# Patient Record
Sex: Male | Born: 1935 | Race: White | Hispanic: No | Marital: Married | Smoking: Former smoker
Health system: Southern US, Community
[De-identification: ages and names within clinical notes are randomized; demographics above are authoritative.]

## PROBLEM LIST (undated history)

## (undated) DIAGNOSIS — I214 Non-ST elevation (NSTEMI) myocardial infarction: Secondary | ICD-10-CM

## (undated) DIAGNOSIS — I2510 Atherosclerotic heart disease of native coronary artery without angina pectoris: Secondary | ICD-10-CM

## (undated) DIAGNOSIS — I509 Heart failure, unspecified: Secondary | ICD-10-CM

## (undated) DIAGNOSIS — R892 Abnormal level of other drugs, medicaments and biological substances in specimens from other organs, systems and tissues: Secondary | ICD-10-CM

## (undated) DIAGNOSIS — N400 Enlarged prostate without lower urinary tract symptoms: Secondary | ICD-10-CM

## (undated) DIAGNOSIS — I10 Essential (primary) hypertension: Secondary | ICD-10-CM

## (undated) DIAGNOSIS — M1990 Unspecified osteoarthritis, unspecified site: Secondary | ICD-10-CM

## (undated) DIAGNOSIS — E785 Hyperlipidemia, unspecified: Secondary | ICD-10-CM

## (undated) DIAGNOSIS — M5416 Radiculopathy, lumbar region: Secondary | ICD-10-CM

## (undated) HISTORY — PX: OTHER SURGICAL HISTORY: SHX169

## (undated) HISTORY — DX: Unspecified osteoarthritis, unspecified site: M19.90

## (undated) HISTORY — DX: Hyperlipidemia, unspecified: E78.5

## (undated) HISTORY — DX: Benign prostatic hyperplasia without lower urinary tract symptoms: N40.0

## (undated) HISTORY — DX: Atherosclerotic heart disease of native coronary artery without angina pectoris: I25.10

## (undated) HISTORY — DX: Radiculopathy, lumbar region: M54.16

## (undated) HISTORY — DX: Abnormal level of other drugs, medicaments and biological substances in specimens from other organs, systems and tissues: R89.2

## (undated) HISTORY — DX: Essential (primary) hypertension: I10

## (undated) HISTORY — PX: FINGER SURGERY: SHX640

## (undated) HISTORY — DX: Heart failure, unspecified: I50.9

---

## 1988-01-22 DIAGNOSIS — M1990 Unspecified osteoarthritis, unspecified site: Secondary | ICD-10-CM | POA: Insufficient documentation

## 1988-01-22 HISTORY — PX: KNEE ARTHROSCOPY: SUR90

## 1988-01-22 HISTORY — DX: Unspecified osteoarthritis, unspecified site: M19.90

## 1998-01-21 DIAGNOSIS — N400 Enlarged prostate without lower urinary tract symptoms: Secondary | ICD-10-CM | POA: Insufficient documentation

## 1998-01-21 HISTORY — DX: Benign prostatic hyperplasia without lower urinary tract symptoms: N40.0

## 1999-08-22 ENCOUNTER — Encounter: Payer: Self-pay | Admitting: Family Medicine

## 1999-08-22 LAB — CONVERTED CEMR LAB: PSA: 6.5 ng/mL

## 2000-01-22 DIAGNOSIS — G250 Essential tremor: Secondary | ICD-10-CM | POA: Insufficient documentation

## 2000-01-22 DIAGNOSIS — R972 Elevated prostate specific antigen [PSA]: Secondary | ICD-10-CM | POA: Insufficient documentation

## 2001-09-21 ENCOUNTER — Encounter: Payer: Self-pay | Admitting: Family Medicine

## 2001-09-21 LAB — CONVERTED CEMR LAB: PSA: 5.1 ng/mL

## 2002-01-21 DIAGNOSIS — E785 Hyperlipidemia, unspecified: Secondary | ICD-10-CM | POA: Insufficient documentation

## 2002-01-21 HISTORY — DX: Hyperlipidemia, unspecified: E78.5

## 2002-09-22 ENCOUNTER — Encounter: Payer: Self-pay | Admitting: Family Medicine

## 2003-05-22 DIAGNOSIS — I10 Essential (primary) hypertension: Secondary | ICD-10-CM | POA: Insufficient documentation

## 2003-05-22 HISTORY — DX: Essential (primary) hypertension: I10

## 2004-01-03 ENCOUNTER — Ambulatory Visit: Payer: Self-pay | Admitting: Family Medicine

## 2004-01-17 ENCOUNTER — Ambulatory Visit: Payer: Self-pay | Admitting: Family Medicine

## 2004-08-23 ENCOUNTER — Ambulatory Visit: Payer: Self-pay | Admitting: Family Medicine

## 2005-01-17 ENCOUNTER — Ambulatory Visit: Payer: Self-pay | Admitting: Family Medicine

## 2005-03-21 ENCOUNTER — Encounter: Payer: Self-pay | Admitting: Family Medicine

## 2005-03-21 DIAGNOSIS — R7989 Other specified abnormal findings of blood chemistry: Secondary | ICD-10-CM | POA: Insufficient documentation

## 2005-03-21 LAB — CONVERTED CEMR LAB: PSA: 4.61 ng/mL

## 2005-04-11 ENCOUNTER — Ambulatory Visit: Payer: Self-pay | Admitting: Family Medicine

## 2005-04-15 ENCOUNTER — Ambulatory Visit: Payer: Self-pay | Admitting: Family Medicine

## 2005-05-02 ENCOUNTER — Ambulatory Visit: Payer: Self-pay | Admitting: Family Medicine

## 2005-09-03 ENCOUNTER — Ambulatory Visit: Payer: Self-pay | Admitting: Family Medicine

## 2005-10-17 ENCOUNTER — Ambulatory Visit: Payer: Self-pay | Admitting: Family Medicine

## 2006-01-22 ENCOUNTER — Ambulatory Visit: Payer: Self-pay | Admitting: Family Medicine

## 2006-04-22 HISTORY — PX: KNEE SURGERY: SHX244

## 2006-04-30 ENCOUNTER — Ambulatory Visit: Payer: Self-pay | Admitting: Family Medicine

## 2006-04-30 LAB — CONVERTED CEMR LAB
ALT: 19 units/L (ref 0–40)
Albumin: 3.7 g/dL (ref 3.5–5.2)
Alkaline Phosphatase: 52 units/L (ref 39–117)
Cholesterol: 197 mg/dL (ref 0–200)
Eosinophils Absolute: 0.6 10*3/uL (ref 0.0–0.6)
Eosinophils Relative: 8.4 % — ABNORMAL HIGH (ref 0.0–5.0)
GFR calc Af Amer: 76 mL/min
Glucose, Bld: 98 mg/dL (ref 70–99)
HCT: 42.9 % (ref 39.0–52.0)
Lymphocytes Relative: 32.4 % (ref 12.0–46.0)
MCV: 87.2 fL (ref 78.0–100.0)
Neutro Abs: 3.8 10*3/uL (ref 1.4–7.7)
Neutrophils Relative %: 51.4 % (ref 43.0–77.0)
Potassium: 4.5 meq/L (ref 3.5–5.1)
Sodium: 144 meq/L (ref 135–145)
TSH: 2.02 microintl units/mL (ref 0.35–5.50)
Total Bilirubin: 0.9 mg/dL (ref 0.3–1.2)
Total Protein: 6 g/dL (ref 6.0–8.3)
Triglycerides: 98 mg/dL (ref 0–149)
WBC: 7.2 10*3/uL (ref 4.5–10.5)

## 2006-05-02 ENCOUNTER — Ambulatory Visit: Payer: Self-pay | Admitting: Family Medicine

## 2006-05-05 ENCOUNTER — Encounter: Payer: Self-pay | Admitting: Family Medicine

## 2006-06-03 ENCOUNTER — Encounter: Admission: RE | Admit: 2006-06-03 | Discharge: 2006-06-03 | Payer: Self-pay

## 2006-07-18 ENCOUNTER — Encounter: Payer: Self-pay | Admitting: Family Medicine

## 2006-09-09 ENCOUNTER — Encounter: Payer: Self-pay | Admitting: Family Medicine

## 2006-12-24 ENCOUNTER — Telehealth: Payer: Self-pay | Admitting: Family Medicine

## 2007-01-05 ENCOUNTER — Encounter: Payer: Self-pay | Admitting: Family Medicine

## 2007-04-28 ENCOUNTER — Telehealth: Payer: Self-pay | Admitting: Family Medicine

## 2007-05-26 ENCOUNTER — Telehealth: Payer: Self-pay | Admitting: Family Medicine

## 2007-07-28 ENCOUNTER — Telehealth: Payer: Self-pay | Admitting: Family Medicine

## 2007-08-10 ENCOUNTER — Ambulatory Visit: Payer: Self-pay | Admitting: Family Medicine

## 2007-08-20 ENCOUNTER — Encounter: Payer: Self-pay | Admitting: Family Medicine

## 2007-08-21 ENCOUNTER — Encounter: Payer: Self-pay | Admitting: Family Medicine

## 2007-09-02 ENCOUNTER — Telehealth (INDEPENDENT_AMBULATORY_CARE_PROVIDER_SITE_OTHER): Payer: Self-pay | Admitting: Internal Medicine

## 2007-09-21 ENCOUNTER — Ambulatory Visit: Payer: Self-pay | Admitting: Family Medicine

## 2007-10-01 ENCOUNTER — Telehealth: Payer: Self-pay | Admitting: Family Medicine

## 2007-10-28 ENCOUNTER — Ambulatory Visit: Payer: Self-pay | Admitting: Family Medicine

## 2007-10-28 LAB — CONVERTED CEMR LAB
ALT: 19 units/L (ref 0–53)
Albumin: 3.9 g/dL (ref 3.5–5.2)
Basophils Relative: 0.2 % (ref 0.0–3.0)
CO2: 32 meq/L (ref 19–32)
Calcium: 9.4 mg/dL (ref 8.4–10.5)
Cholesterol: 214 mg/dL (ref 0–200)
Creatinine, Ser: 1.3 mg/dL (ref 0.4–1.5)
Creatinine,U: 130.5 mg/dL
GFR calc Af Amer: 69 mL/min
Glucose, Bld: 97 mg/dL (ref 70–99)
HCT: 40.9 % (ref 39.0–52.0)
Hemoglobin: 14.2 g/dL (ref 13.0–17.0)
Lymphocytes Relative: 34 % (ref 12.0–46.0)
MCHC: 34.7 g/dL (ref 30.0–36.0)
Microalb Creat Ratio: 9.2 mg/g (ref 0.0–30.0)
Microalb, Ur: 1.2 mg/dL (ref 0.0–1.9)
Monocytes Absolute: 0.5 10*3/uL (ref 0.1–1.0)
Monocytes Relative: 8 % (ref 3.0–12.0)
Neutro Abs: 3.3 10*3/uL (ref 1.4–7.7)
RBC: 4.6 M/uL (ref 4.22–5.81)
RDW: 12.6 % (ref 11.5–14.6)
Sodium: 143 meq/L (ref 135–145)
Total CHOL/HDL Ratio: 4.7
Total Protein: 6.2 g/dL (ref 6.0–8.3)
VLDL: 14 mg/dL (ref 0–40)

## 2007-11-02 ENCOUNTER — Ambulatory Visit: Payer: Self-pay | Admitting: Family Medicine

## 2007-11-02 ENCOUNTER — Encounter: Payer: Self-pay | Admitting: Family Medicine

## 2007-11-02 DIAGNOSIS — N529 Male erectile dysfunction, unspecified: Secondary | ICD-10-CM | POA: Insufficient documentation

## 2007-11-27 ENCOUNTER — Ambulatory Visit: Payer: Self-pay | Admitting: Family Medicine

## 2007-11-27 LAB — CONVERTED CEMR LAB: OCCULT 3: NEGATIVE

## 2007-11-30 ENCOUNTER — Encounter (INDEPENDENT_AMBULATORY_CARE_PROVIDER_SITE_OTHER): Payer: Self-pay | Admitting: *Deleted

## 2008-01-18 ENCOUNTER — Ambulatory Visit: Payer: Self-pay | Admitting: Family Medicine

## 2008-05-02 ENCOUNTER — Ambulatory Visit: Payer: Self-pay | Admitting: Family Medicine

## 2008-05-02 DIAGNOSIS — D485 Neoplasm of uncertain behavior of skin: Secondary | ICD-10-CM | POA: Insufficient documentation

## 2008-05-09 ENCOUNTER — Ambulatory Visit: Payer: Self-pay | Admitting: Family Medicine

## 2008-05-09 ENCOUNTER — Encounter: Payer: Self-pay | Admitting: Family Medicine

## 2008-05-18 ENCOUNTER — Telehealth: Payer: Self-pay | Admitting: Family Medicine

## 2008-10-25 ENCOUNTER — Ambulatory Visit: Payer: Self-pay | Admitting: Family Medicine

## 2008-10-25 LAB — CONVERTED CEMR LAB
ALT: 19 units/L (ref 0–53)
Alkaline Phosphatase: 51 units/L (ref 39–117)
BUN: 29 mg/dL — ABNORMAL HIGH (ref 6–23)
Bilirubin, Direct: 0.1 mg/dL (ref 0.0–0.3)
Calcium: 9.4 mg/dL (ref 8.4–10.5)
Chloride: 109 meq/L (ref 96–112)
Cholesterol: 201 mg/dL — ABNORMAL HIGH (ref 0–200)
Creatinine, Ser: 1.6 mg/dL — ABNORMAL HIGH (ref 0.4–1.5)
Creatinine,U: 150.5 mg/dL
Direct LDL: 139.6 mg/dL
Eosinophils Absolute: 0.4 10*3/uL (ref 0.0–0.7)
Eosinophils Relative: 5.6 % — ABNORMAL HIGH (ref 0.0–5.0)
GFR calc non Af Amer: 44.6 mL/min (ref >60)
HDL: 42.9 mg/dL (ref >39.00)
Lymphocytes Relative: 30.7 % (ref 12.0–46.0)
MCV: 88.4 fL (ref 78.0–100.0)
Microalb, Ur: 1.2 mg/dL (ref 0.0–1.9)
Monocytes Absolute: 0.5 10*3/uL (ref 0.1–1.0)
Neutrophils Relative %: 55.4 % (ref 43.0–77.0)
PSA: 5.28 ng/mL — ABNORMAL HIGH (ref 0.10–4.00)
Platelets: 171 10*3/uL (ref 150.0–400.0)
Total Bilirubin: 0.8 mg/dL (ref 0.3–1.2)
Total CHOL/HDL Ratio: 5
Triglycerides: 104 mg/dL (ref 0.0–149.0)
VLDL: 20.8 mg/dL (ref 0.0–40.0)
WBC: 6.3 10*3/uL (ref 4.5–10.5)

## 2008-11-01 LAB — HM DIABETES EYE EXAM: HM Diabetic Eye Exam: NORMAL

## 2008-11-15 ENCOUNTER — Ambulatory Visit: Payer: Self-pay | Admitting: Family Medicine

## 2008-11-15 DIAGNOSIS — E559 Vitamin D deficiency, unspecified: Secondary | ICD-10-CM | POA: Insufficient documentation

## 2008-11-17 ENCOUNTER — Telehealth: Payer: Self-pay | Admitting: Family Medicine

## 2008-11-30 ENCOUNTER — Ambulatory Visit: Payer: Self-pay | Admitting: Family Medicine

## 2008-11-30 ENCOUNTER — Encounter (INDEPENDENT_AMBULATORY_CARE_PROVIDER_SITE_OTHER): Payer: Self-pay | Admitting: *Deleted

## 2008-11-30 LAB — FECAL OCCULT BLOOD, GUAIAC: Fecal Occult Blood: NEGATIVE

## 2008-12-13 ENCOUNTER — Ambulatory Visit: Payer: Self-pay | Admitting: Family Medicine

## 2008-12-13 LAB — CONVERTED CEMR LAB
Albumin: 3.8 g/dL (ref 3.5–5.2)
BUN: 21 mg/dL (ref 6–23)
CO2: 31 meq/L (ref 19–32)
Chloride: 105 meq/L (ref 96–112)

## 2008-12-21 ENCOUNTER — Telehealth: Payer: Self-pay | Admitting: Family Medicine

## 2008-12-21 ENCOUNTER — Ambulatory Visit: Payer: Self-pay | Admitting: Family Medicine

## 2008-12-28 ENCOUNTER — Ambulatory Visit: Payer: Self-pay | Admitting: Family Medicine

## 2009-08-23 ENCOUNTER — Encounter (INDEPENDENT_AMBULATORY_CARE_PROVIDER_SITE_OTHER): Payer: Self-pay | Admitting: *Deleted

## 2009-09-15 ENCOUNTER — Telehealth: Payer: Self-pay | Admitting: Family Medicine

## 2009-12-13 ENCOUNTER — Ambulatory Visit: Payer: Self-pay | Admitting: Family Medicine

## 2009-12-18 LAB — CONVERTED CEMR LAB
BUN: 24 mg/dL — ABNORMAL HIGH (ref 6–23)
Bilirubin, Direct: 0.2 mg/dL (ref 0.0–0.3)
CO2: 25 meq/L (ref 19–32)
Chloride: 107 meq/L (ref 96–112)
Creatinine,U: 119.3 mg/dL
Eosinophils Relative: 4.2 % (ref 0.0–5.0)
Glucose, Bld: 97 mg/dL (ref 70–99)
HCT: 38.9 % — ABNORMAL LOW (ref 39.0–52.0)
Hemoglobin: 13.5 g/dL (ref 13.0–17.0)
Indirect Bilirubin: 0.6 mg/dL (ref 0.0–0.9)
LDL Cholesterol: 127 mg/dL — ABNORMAL HIGH (ref 0–99)
Lymphs Abs: 2 10*3/uL (ref 0.7–4.0)
Microalb, Ur: 2.1 mg/dL — ABNORMAL HIGH (ref 0.0–1.9)
Monocytes Relative: 8.4 % (ref 3.0–12.0)
PSA: 5.28 ng/mL — ABNORMAL HIGH (ref <=4.00)
Platelets: 175 10*3/uL (ref 150.0–400.0)
Potassium: 4.8 meq/L (ref 3.5–5.3)
Total Protein: 6.1 g/dL (ref 6.0–8.3)
VLDL: 15 mg/dL (ref 0–40)
WBC: 5.7 10*3/uL (ref 4.5–10.5)

## 2009-12-20 ENCOUNTER — Ambulatory Visit: Payer: Self-pay | Admitting: Family Medicine

## 2010-02-20 NOTE — Assessment & Plan Note (Signed)
Summary: CPX/JRR   Vital Signs:  Patient profile:   77 year old male Height:      69 inches Weight:      205.75 pounds BMI:     30.49 Temp:     97.0 degrees F oral Pulse rate:   68 / minute Pulse rhythm:   regular BP sitting:   156 / 80  (left arm) Cuff size:   large  Vitals Entered By: Linde Gillis CMA Duncan Dull) (December 20, 2009 2:48 PM) CC: complete physicial   History of Present Illness: Pt here for Comp Exam. He has a [facility] Medicare product. He is having a roaring in his ears and a slight headache. He takes his BP at home  with average in the 120s/60s and pulse of 60-62. No extra meds. His headache is bitemporal with slight ear roaring. He has some drainage.    Preventive Screening-Counseling & Management  Alcohol-Tobacco     Alcohol drinks/day: <1     Alcohol type: beer occas     Smoking Status: quit     Year Quit: 1999     Pack years: Pipe     Passive Smoke Exposure: no  Caffeine-Diet-Exercise     Caffeine use/day: 3     Does Patient Exercise: yes     Type of exercise: Gym bike, treadmill, weights     Times/week: 2  Problems Prior to Update: 1)  Neoplasm of Uncertain Behavior of Skin  (ICD-238.2) 2)  Vitamin D Deficiency  (ICD-268.9) 3)  Renal Insufficiency  (ICD-588.9) 4)  Neoplasm, Skin, Uncertain Behavior  (ICD-238.2) 5)  Special Screening Malig Neoplasms Other Sites  (ICD-V76.49) 6)  Organic Impotence  (ICD-607.84) 7)  Hyperglycemia  (ICD-790.6) 8)  Osteoarthritis  (ICD-715.90) 9)  Hyperlipidemia  (ICD-272.4) 10)  Tremor  (ICD-781.0) 11)  Hypertension  (ICD-401.9) 12)  Benign Prostatic Hypertrophy  (ICD-600.00) 13)  Elevated Prostate Specific Antigen  (ICD-790.93)  Medications Prior to Update: 1)  Finasteride 5 Mg  Tabs (Finasteride) .Marland Kitchen.. 1 By Mouth Daily 2)  Alprazolam 0.25 Mg  Tabs (Alprazolam) .Marland Kitchen.. 1 Tablet By Mouth Three Times A Day  As Needed 3)  Doxazosin Mesylate 4 Mg  Tabs (Doxazosin Mesylate) .Marland Kitchen.. 1 Tablet Daily By Mouth 4)  Metoprolol  Succinate 100 Mg  Xr24h-Tab (Metoprolol Succinate) .... One Tab By Mouth At Night 5)  Amlodipine Besylate 10 Mg Tabs (Amlodipine Besylate) .... One Tab By Mouth Daily 6)  Fish Oil 1000 Mg Caps (Omega-3 Fatty Acids) .Marland Kitchen.. 1 Daily By Mouth 7)  Centrum Silver  Tabs (Multiple Vitamins-Minerals) .Marland Kitchen.. 1 Daily By Mouth 8)  Bayer Aspirin Ec Low Dose 81 Mg Tbec (Aspirin) .Marland Kitchen.. 1 Daily By Moputh 9)  Levitra 20 Mg Tabs (Vardenafil Hcl) .... One Tab By Mouth One Hour Prior To Intercourse. 10)  Lisinopril 5 Mg Tabs (Lisinopril) .... One Tab By Mouth in Am  Current Medications (verified): 1)  Finasteride 5 Mg  Tabs (Finasteride) .Marland Kitchen.. 1 By Mouth Daily 2)  Alprazolam 0.25 Mg  Tabs (Alprazolam) .Marland Kitchen.. 1 Tablet By Mouth Three Times A Day  As Needed 3)  Doxazosin Mesylate 4 Mg  Tabs (Doxazosin Mesylate) .Marland Kitchen.. 1 Tablet Daily By Mouth 4)  Metoprolol Succinate 100 Mg  Xr24h-Tab (Metoprolol Succinate) .... One Tab By Mouth At Night 5)  Amlodipine Besylate 10 Mg Tabs (Amlodipine Besylate) .... One Tab By Mouth Daily 6)  Fish Oil 1000 Mg Caps (Omega-3 Fatty Acids) .Marland Kitchen.. 1 Daily By Mouth 7)  Centrum Silver  Tabs (Multiple Vitamins-Minerals) .Marland KitchenMarland KitchenMarland Kitchen  1 Daily By Mouth 8)  Bayer Aspirin Ec Low Dose 81 Mg Tbec (Aspirin) .Marland Kitchen.. 1 Daily By Moputh 9)  Levitra 20 Mg Tabs (Vardenafil Hcl) .... One Tab By Mouth One Hour Prior To Intercourse. 10)  Lisinopril 5 Mg Tabs (Lisinopril) .... One Tab By Mouth in Am  Allergies: 1)  ! 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  Knee  Replacement  (Dr Tresa Moore, [place])  4/ 2008  Family History: Last updated: 12/20/2009 Father dec 66 Aneurysm Mother dec 90 Htn [employer]  dec 65 MI                           A   79  DM  Social History: Last updated: 11/02/2007 Retired Patent examiner,  [employer], Own business(lawn serv) Married/Widower 11/2004 [place]  Daughter dec Metastic Melanoma REMARRIED 6/40 09 Former Smoker Alcohol use-yes Drug use-no  Risk Factors: Alcohol Use: <1 (12/20/2009) Caffeine Use: 3 (12/20/2009) Exercise: yes (12/20/2009)  Risk Factors: Smoking Status: quit (12/20/2009) Passive Smoke Exposure: no (12/20/2009)  Family History: Father dec 66 Aneurysm Mother dec 90 Htn [employer]  dec 65 MI                          A   42  DM  Social History: Caffeine use/day:  3  Review of Systems General:  Denies chills, fatigue, fever, sweats, weakness, and weight loss. Eyes:  Denies blurring, discharge, and eye pain. ENT:  Complains of decreased hearing and nosebleeds; denies ear discharge, earache, and ringing in ears; early stages.. CV:  Denies chest pain or discomfort, fainting, fatigue, palpitations, shortness of breath with exertion, swelling of feet, and swelling of hands. Resp:  Denies cough, excessive snoring, shortness of breath, and wheezing. GI:  Complains of constipation; denies abdominal pain, bloody stools, change in bowel habits, dark tarry stools, diarrhea, indigestion, loss of appetite, nausea, vomiting, vomiting blood, and yellowish skin color; rare. GU:  Complains of nocturia; denies discharge, dysuria, and urinary frequency; once. MS:  Denies joint pain, low back pain, muscle aches, cramps, and stiffness; takes gin and raisins for shoulders. Derm:  Denies dryness, itching, and rash. Neuro:  Denies numbness, poor balance, tingling, and tremors.  Physical Exam  General:  Well-developed,well-nourished,in no acute distress; alert,appropriate and cooperative throughout examination. Head:  Normocephalic and atraumatic without obvious abnormalities. Sinuses NT. Eyes:  Conjunctiva clear bilaterally.  Ears:  External ear exam shows no  significant lesions or deformities.  Otoscopic examination reveals clear canals, tympanic membranes are intact bilaterally without bulging, retraction, inflammation or discharge. Hearing is grossly normal bilaterally. Nose:  External nasal examination shows no deformity or inflammation. Nasal mucosa are pink and moist without lesions or exudates. Mouth:  Oral mucosa and oropharynx without lesions or exudates.  Teeth in good repair. Neck:  No deformities, masses, or tenderness noted. Chest Wall:  No deformities, masses, tenderness or gynecomastia noted. Breasts:  No masses or gynecomastia noted Lungs:  Normal respiratory effort, chest expands symmetrically. Lungs are clear to auscultation, no crackles or wheezes. Heart:  Normal rate and regular rhythm. S1 and S2 normal without gallop, murmur, click, rub or other extra sounds. Abdomen:  Bowel sounds positive,abdomen soft and non-tender without  masses, organomegaly or hernias noted. Rectal:  No external abnormalities noted. Normal sphincter tone. No rectal masses or tenderness. G neg. Genitalia:  Testes bilaterally descended without nodularity, tenderness or masses. No scrotal masses or lesions. No penis lesions or urethral discharge. Prostate:  Prostate gland firm and smooth, no enlargement, nodularity, tenderness, mass, asymmetry or induration. 40-50gms. Msk:  No deformity or scoliosis noted of thoracic or lumbar spine.   Pulses:  R and L carotid,radial,femoral,dorsalis pedis and posterior tibial pulses are full and equal bilaterally Extremities:  No clubbing, cyanosis, edema, or deformity noted with normal full range of motion of all joints.   Neurologic:  No cranial nerve deficits noted. Station and gait are normal. Sensory, motor and coordinative functions appear intact. Skin:  Intact without suspicious lesions or rashes Cervical Nodes:  No lymphadenopathy noted Inguinal Nodes:  No significant adenopathy Psych:  Cognition and judgment appear  intact. Alert and cooperative with normal attention span and concentration. No apparent delusions, illusions, hallucinations   Impression & Recommendations:  Problem # 1:  HEALTH MAINTENANCE EXAM (ICD-V70.0) Assessment Comment Only  Pt has [employer] Medicare. I have personally reviewed the Medicare Annual Wellness questionnaire and have noted 1.   The patient's medical and social history 2.   Their use of alcohol, tobacco or illicit drugs 3.   Their current medications and supplements 4.   The patient's functional ability including ADL's, fall risks, home safety risks. 5.   Diet and physical activities 6.   Evidence for depression or mood disorders  Reviewed preventive care protocols, scheduled due services, and updated immunizations.  Problem # 2:  VITAMIN D DEFICIENCY (ICD-268.9) Assessment: Unchanged Start taking OTC Vit D 1000Iu daily. Should easily get him therapeutic.  Problem # 3:  RENAL INSUFFICIENCY (ICD-588.9) Assessment: Improved Normalized.  Problem # 4:  ORGANIC IMPOTENCE (ICD-607.84) Assessment: Unchanged  Has tried Viagra and Levitra...would like to try Cialis. Will give script for daily version. His updated medication list for this problem includes:    Cialis 5 Mg Tabs (Tadalafil) ..... One tab by mouth one hr prior to desired activity  Discussed proper use of medications, as well as side effects.   Problem # 5:  HYPERGLYCEMIA (ICD-790.6) Assessment: Improved Normal today. Encouraged to cont to avoid sweets and carbs.  Problem # 6:  HYPERLIPIDEMIA (ICD-272.4) Assessment: Unchanged Adequate, try to get LDL lower by avoiding fatty foods. Labs Reviewed: SGOT: 16 (12/13/2009)   SGPT: 13 (12/13/2009)   HDL:47 (12/13/2009), 42.90 (10/25/2008)  LDL:127 (12/13/2009), DEL (10/28/2007)  Chol:189 (12/13/2009), 201 (10/25/2008)  Trig:73 (12/13/2009), 104.0 (10/25/2008)  Problem # 7:  ELEVATED PROSTATE SPECIFIC ANTIGEN (ICD-790.93) Assessment: Unchanged Nos stable and  c/w his BPH which clinically feels enlarged but nml.  Complete Medication List: 1)  Finasteride 5 Mg Tabs (Finasteride) .Marland Kitchen.. 1 by mouth daily 2)  Alprazolam 0.25 Mg Tabs (Alprazolam) .Marland Kitchen.. 1 tablet by mouth three times a day  as needed 3)  Doxazosin Mesylate 4 Mg Tabs (Doxazosin mesylate) .Marland Kitchen.. 1 tablet daily by mouth 4)  Metoprolol Succinate 100 Mg Xr24h-tab (Metoprolol succinate) .... One tab by mouth at night 5)  Amlodipine Besylate 10 Mg Tabs (Amlodipine besylate) .... One tab by mouth daily 6)  Fish Oil 1000 Mg Caps (Omega-3 fatty acids) .Marland Kitchen.. 1 daily by mouth 7)  Centrum Silver Tabs (Multiple vitamins-minerals) .Marland Kitchen.. 1 daily by mouth 8)  Bayer Aspirin Ec Low Dose 81 Mg Tbec (Aspirin) .Marland Kitchen.. 1 daily by moputh 9)  Cialis 5 Mg Tabs (Tadalafil) .... One tab by  mouth one hr prior to desired activity 10)  Lisinopril 5 Mg Tabs (Lisinopril) .... One tab by mouth in am  Patient Instructions: 1)  Take Guaifenesin by [facility] or [facility] and getting MUCOUS RELIEF EXPECTORANT (400mg ), take 11/2 tabs by mouth AM and NOON. 2)  Drink lots of fluids anytime taking Guaifenesin.  3)  RTC if BP elevated at home. 4)  RTC o/w in one year. Prescriptions: ALPRAZOLAM 0.25 MG  TABS (ALPRAZOLAM) 1 TABLET BY MOUTH three times a day  AS NEEDED  #270 x 0   Entered and Authorized by:   Shaune Leeks MD   Signed by:   Shaune Leeks MD on 12/20/2009   Method used:   Print then Give to Patient   RxID:   [id number] CIALIS 5 MG TABS (TADALAFIL) one tab by mouth one hr prior to desired activity  #10 x 5   Entered and Authorized by:   Shaune Leeks MD   Signed by:   Shaune Leeks MD on 12/20/2009   Method used:   Print then Give to Patient   RxID:   [id number] LISINOPRIL 5 MG TABS (LISINOPRIL) one tab by mouth in AM  #90 x 4   Entered and Authorized by:   Shaune Leeks MD   Signed by:   Shaune Leeks MD on 12/20/2009   Method used:    [facility]        [facility] [address] [facility] [address] [phone]* (retail)       [address]       [place], [place]  [address]       Ph: [phone] or [phone]       Fax: [phone]   RxID:   [id number] FISH OIL 1000 MG CAPS (OMEGA-3 FATTY ACIDS) 1 daily by mouth  #90 x 4   Entered and Authorized by:   Shaune Leeks MD   Signed by:   Shaune Leeks MD on 12/20/2009   Method used:   Electronically to        [facility] [address] [phone]* (retail)       [address]       [place], [place]  [address]       Ph: [phone] or [phone]       Fax: [phone]   RxID:   [id number] AMLODIPINE BESYLATE 10 MG TABS (AMLODIPINE BESYLATE) one tab by mouth daily  #90 x 4   Entered and Authorized by:   Shaune Leeks MD   Signed by:   Shaune Leeks MD on 12/20/2009   Method used:   Electronically to        [facility] [address] [phone]* (retail)       [address]       [place], [place]  [address]       Ph: [phone] or [phone]       Fax: [phone]   RxID:   [id number] METOPROLOL SUCCINATE 100 MG  XR24H-TAB (METOPROLOL SUCCINATE) one tab by mouth at night  #90 x 4   Entered and Authorized by:   Shaune Leeks MD   Signed by:   Shaune Leeks MD on 12/20/2009   Method used:   [facility]ronically to        [facility]  [address] [phone]* (retail)       [address]       [place], [place]  [address]       Ph: [phone] or [phone]       Fax: [phone]   RxID:   [id number] DOXAZOSIN MESYLATE 4 MG  TABS (DOXAZOSIN MESYLATE) 1 TABLET DAILY BY MOUTH  #90 x 4   Entered and Authorized by:   Shaune Leeks MD   Signed by:   Shaune Leeks MD on 12/20/2009   Method used:   Electronically to        [facility] [address] [phone]* (retail)       [address]       [place], [place]  [address]       Ph: [phone] or [phone]       Fax: [phone]   RxID:   [id number] FINASTERIDE 5 MG  TABS (FINASTERIDE) 1 by mouth daily  #90 x 4   Entered and Authorized by:   Shaune Leeks MD   Signed by:   Shaune Leeks MD on 12/20/2009   Method used:   Electronically to        [facility] [address] [phone]* (retail)       [address]       [place], [place]  [address]       Ph: [phone] or [phone]       Fax: [phone]   RxID:   [id number]    Orders Added: 1)  Est. Patient 65& > [[address]]    Current Allergies (reviewed today): ! * PENICILLIN  Appended Document: CPX/JRR Will have him start Vit D 1000Iu daily as well.

## 2010-02-20 NOTE — Letter (Signed)
Summary: Shawn Andrews letter  [facility]  [address] [place], [place] [address]   Phone: [phone]  Fax: [phone]       08/23/2009 MRN: [id number]  Shawn Andrews [address] [place], [place]  [address]  Dear [name] [place] and [place] announce the retirement of Arta Silence, M.D., from full-time practice at the [facility] office effective July 20, 2009 and his plans of returning part-time.  It is important to Dr. Hetty Ely and to our practice that you understand that [facility] has seven physicians in our office for your health care needs.  We will continue to offer the same exceptional care that you have today.    Dr. Hetty Ely has spoken to many of you about his plans for retirement and returning part-time in the fall.   We will continue to work with you through the transition to schedule appointments for you in the office and meet the high standards that [facility] is committed to.   Again, it is with great pleasure that we share the news that Dr. Hetty Ely will return to [facility] in October of 2011 with a reduced schedule.    If you have any questions, or would like to request an appointment with one of our physicians, please call us at [phone] and press the option for Scheduling an appointment.  We take pleasure in providing you with excellent patient care and look forward to seeing you at your next office visit.  Our [facility] Physicians are:  Tillman Abide, M.D. Laurita Quint, M.D. Roxy Manns, M.D. Kerby Nora, M.D. Hannah Beat, M.D. Ruthe Mannan, M.D. We proudly welcomed Raechel Ache, M.D. and Eustaquio Boyden, M.D. to the practice in July/August 2011.  Sincerely,   [facility]

## 2010-02-20 NOTE — Progress Notes (Signed)
Summary: refill requests for alprazolam  Phone Note Refill Request Message from:  Patient  Refills Requested: Medication #1:  ALPRAZOLAM 0.25 MG  TABS 1 TABLET BY MOUTH three times a day  AS NEEDED Phoned request from pt's wife, [facility].  Initial call taken by: Lowella Petties CMA,  September 15, 2009 9:28 AM  Follow-up for Phone Call        please call in and have patient schedule appointment this fall.  Follow-up by: Crawford Givens MD,  September 15, 2009 11:51 AM  Additional Follow-up for Phone Call Additional follow up Details #1::        Faxed hard Rx. to [facility].  Patient Advised.  Additional Follow-up by: Delilah Shan CMA (AAMA),  September 15, 2009 12:11 PM    Prescriptions: ALPRAZOLAM 0.25 MG  TABS (ALPRAZOLAM) 1 TABLET BY MOUTH three times a day  AS NEEDED  #270 x 0   Entered by:   Delilah Shan CMA (AAMA)   Authorized by:   Crawford Givens MD   Signed by:   Delilah Shan CMA (AAMA) on 09/15/2009   Method used:   Printed then faxed to ...       [facility]* (mail-order)       [address]       [place], [place]  [address]       Ph: [phone]       Fax: [phone]   RxID:   [id number] ALPRAZOLAM 0.25 MG  TABS (ALPRAZOLAM) 1 TABLET BY MOUTH three times a day  AS NEEDED  #270 x 0   Entered and Authorized by:   Crawford Givens MD   Signed by:   Crawford Givens MD on 09/15/2009   Method used:   Telephoned to ...       [facility]  [address] [phone]* (retail)       [address]       [place], [place]  [id number]       Ph: [phone] or [phone]       Fax: [phone]   RxID:   [id number]

## 2010-05-26 ENCOUNTER — Encounter: Payer: Self-pay | Admitting: Family Medicine

## 2010-05-31 ENCOUNTER — Ambulatory Visit (INDEPENDENT_AMBULATORY_CARE_PROVIDER_SITE_OTHER): Payer: Self-pay | Admitting: Family Medicine

## 2010-05-31 ENCOUNTER — Other Ambulatory Visit: Payer: Self-pay | Admitting: Family Medicine

## 2010-05-31 ENCOUNTER — Encounter: Payer: Self-pay | Admitting: Family Medicine

## 2010-05-31 DIAGNOSIS — D485 Neoplasm of uncertain behavior of skin: Secondary | ICD-10-CM

## 2010-05-31 DIAGNOSIS — R259 Unspecified abnormal involuntary movements: Secondary | ICD-10-CM

## 2010-05-31 DIAGNOSIS — I10 Essential (primary) hypertension: Secondary | ICD-10-CM

## 2010-05-31 MED ORDER — ALPRAZOLAM 0.25 MG PO TABS: 0.2500 mg | ORAL_TABLET | Freq: Three times a day (TID) | ORAL | Status: DC | PRN

## 2010-05-31 MED ORDER — AMLODIPINE BESYLATE 10 MG PO TABS: 10.0000 mg | ORAL_TABLET | Freq: Every day | ORAL | Status: DC

## 2010-05-31 NOTE — Progress Notes (Signed)
  Subjective:    Patient ID: Shawn Andrews, male    DOB: Dec 03, 1928, 77 y.o.   MRN: [id number]  HPI Pt here for lesion behind the ear that has popped up and cointinued to grow over the last few months. No pain but gets easily irritated from his glasses also.  He also has a SK on the back that continues to enlarge and get dark and waxy. He also would like his Amlodipine changed as the pharmacy tells him it is time release and it costs him over one hundred dollars for a prescription.  He also needs his Xanax refilled for his tremor. He take 0.25mg , typically twice a day, sometimes 3 times a day.    Review of SystemsNoncontributory except as above.       Objective:   Physical Exam  Constitutional: He appears well-developed and well-nourished. No distress.  HENT:  Head: Normocephalic and atraumatic.  Right Ear: External ear normal.  Left Ear: External ear normal.  Nose: Nose normal.  Mouth/Throat: Oropharynx is clear and moist.  Eyes: Conjunctivae and EOM are normal. Pupils are equal, round, and reactive to light. Right eye exhibits no discharge. Left eye exhibits no discharge.  Neck: Normal range of motion. Neck supple.  Cardiovascular: Normal rate and regular rhythm.   Pulmonary/Chest: Effort normal and breath sounds normal. He has no wheezes.  Lymphadenopathy:    He has no cervical adenopathy.  Skin: Skin is warm and dry. He is not diaphoretic.       Mobile 8mm fleshy irregular mole on a stalk behin the left ear in the crease.  1.2cm waxy, hyperpigmented oval SK on the upper left back that apppears benign but itches and gets easily irritated.          Assessment & Plan:  Skin lesion behind the left ear, irregular and irritated.  Area behind the left ear prepped in usual sterile fashion. Local provided with 2% Lido w/ Epi, approx 3/4cc. Lesion removed in shave fashion and skin bed cauterized with hyfrecator. Bactroban and sterile dressing placed Keep clean and dry for 24hrs.  RTC if redness or tenderness ensues. Lesion sent to pathology.  Area of left upper back prepared and treated as above with typical SK removed. Not sent to pathology. Sterile dressing with Bactroban also placed.  Pt tolerated both procedures well.

## 2010-05-31 NOTE — Assessment & Plan Note (Signed)
Well controlled. Cont Amlodipine. Prescription printed for pt to take to alternate pharmacy to price medication. Amlodipine is generic.

## 2010-05-31 NOTE — Assessment & Plan Note (Signed)
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 "Everyone in my family had tremor when they got to be my age." Script written for Xanax, 0.25mg  tid prn 270/0. Limit to 2 a day if poss.

## 2010-06-08 NOTE — Letter (Signed)
May 04, 2006    Shawn Andrews, M.D.  [facility]  [address] [place], [place] [address]   RE:  Shawn, Andrews  MRN:  [id number]  /  DOB:  October 04, 1928   Dear Dr. Baker Pierini:   This is a preoperative clearance for a right partial knee replacement  which is to be done on May 14, 2006.   [name], a 77 year old white male, is here today for  preoperative clearance for his right partial knee replacement.  He is a  very healthy 77 year old male whom I have cared for since April 2005.  He unfortunately has seen the demise of both his daughter and his wife  from cancer.  His wife most recently died in 12/13/04 his  daughter prior to that.  The patient is very well adjusted and is very  active.   He is cleared for surgery today and appears very healthy.  I am assuming  that his surgery will be done under local anesthetic, and I am not  referring him for cardiology evaluation.  His EKG today was normal  except for bradycardia which is longstanding.   PAST MEDICAL HISTORY:  Significant for:  1. Elevated PSA since 2002.  He had a biopsy in the remote past that      was negative.  His PSA has again gone from 4.6 in March 2007 to      again 6.01 in April 2008, and I will be referring him for      reevaluation after his surgery.  2. He also has benign prostatic hypertrophy since 2000 and has a      prostate of 60 g which  feels normal today.  3. He also has hypertension since May 2005.  4. Tremor since 2002.  5. Elevated cholesterol since 1004.  6. Arthritis of many years.  7. Elevated glucose since March 2007 which has not gone to the      diabetic range.  8. He also has had right knee arthroscopy approximately in 1990.  9. Right middle finger partial traumatic amputation in about 1955.   He denies rheumatic fever, scarlet fever, heart disease, asthma,  pneumonia, diabetes, liver disease, and thyroid disease.  His  hypertension has been  documented since at least my first seeing him in  April 2005.   He is allergic to PENICILLIN causing swelling of the tongue.   He smoked a pipe but quit approximately 1990.  He does chew occasional  tobacco.  He has a bloody Mary with pizzas, maybe as much as once a  month.   MEDICATIONS:  1. Megavitamins.  2. Super C500 complex.  3. Vitamin E 400 international units a day.  4. Proscar 5 mg a day.  5. Lipitor 10 mg at night.  6. Cardura 4 mg in the morning.  7. Aspirin 81 mg enteric coated 1 a day.  8. Atenolol 25 mg a day.  9. Xanax 0.25 3 times a day p.r.n. tremor.  10.He has used Viagra in the past.   REVIEW OF SYSTEMS:  Significant for wearing glasses since 77 years of  age.  His last exam was last year.  He was tested for glaucoma and was  told he had mild cataracts.  He has mild hearing loss with occasional  ringing of ears.  He has partials up and down with his last dental exam  a few days ago.  He has rare chest pain, presumably  from rare reflux  disease.  He has had both a colonoscopy and a sigmoidoscopy in the  remote past.  He has urinary hesitancy with urgency, dribbling, and  nocturia once a night.  Otherwise head, eyes, ears, nose throat, teeth,  gums, cardiorespiratory, gastrointestinal, and genitourinary systems are  noncontributory.   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  He also  had his own lamp shade business.  He is now a widower, was married for  more than 50 years.  He has one child living and one daughter deceased.   FAMILY HISTORY:  Father died at the age of 64 with aneurysm.  Mother  died at the age of 13 with high blood pressure.  One brother died at 75  years of age due to heart attack, and that brother's twin is still  alive.   His last tetanus shot was in 2003.   PHYSICAL  EXAMINATION:  GENERAL:  Well-developed, well-nourished, 77-year-  old white male looking robust, in no acute distress.  VITAL SIGNS:  Temperature 97.7, pulse 60, blood pressure 130/70, weight  199 at 70 inches, with allergy to PENICILLIN causing swelling of the  tongue.  HEENT:  Conjunctivae are clear.  HEENT is within normal limits.  Hearing  is off slightly.  NECK:  Without adenopathy.  Thyroid is without nodularity.  LUNGS:  Clear to auscultation.  BACK:  Straight and nontender with no CVA tenderness.  HEART:  Regular rate and rhythm without murmur.  Pulses are 2+  throughout.  Carotids are without bruits.  CHEST:  Symmetric with good excursion.  ABDOMEN:  Soft, nontender.  Good bowel sounds and no masses.  ADENOPATHY:  No adenopathy is noted throughout.  GU:  Testicles are descended bilaterally without nodularity.  No hernias  or adenopathy are noted.  RECTAL:  Normal tone.  Guaiac-negative stool and no mass.  Prostate is  60 g, smooth, soft, symmetric.  Raphe intact without nodularity.  NEUROLOGIC:  Muscle strength is 5/5 with range of motion, gait, mobility  generally normal except for mild decreased range of motion due to his  right knee.  SKIN:  Significant for actinic keratosis and benign age spots.   EKG again was normal except for bradycardia.   ASSESSMENT:  1. Preoperative physical exam, cleared for right partial knee      replacement.  2. Hypertension.  3. Elevated glucose.  4. Benign prostatic hypertrophy.  5. Elevated prostate-specific antigen.  6. Elevated cholesterol.   PLAN:  Continue his medications as prescribed.  He is on atenolol both  for hypertension and for tremor.  Continue the Xanax p.r.n. tremor.  Will have his prostate looked at after his knee is done and his  rehabilitation is back to normal.  His urination seems okay, and his  cholesterol is  adequate, although I am going to increase his Lipitor to 10 mg at night and recheck that in the future.   We discussed pneumonia shot and  Zostavax which he hopefully will get in the future.  Otherwise, I will  see him back after his rehab. Thank you for allowing me to assist in the  care of this fine gentleman.    Sincerely,      Arta Silence, MD  Electronically Signed    RNS/MedQ  DD: 05/04/2006  DT: 05/04/2006  Job #: [id number]

## 2010-09-19 ENCOUNTER — Telehealth: Payer: Self-pay | Admitting: *Deleted

## 2010-09-19 NOTE — Telephone Encounter (Signed)
Pt is asking if he can change from metoprolol succinate to something generic, perhaps metoprolol tartrate. This would be less expensive for him.  Uses [facility].

## 2010-09-20 MED ORDER — METOPROLOL TARTRATE 50 MG PO TABS: 50.0000 mg | ORAL_TABLET | Freq: Two times a day (BID) | ORAL | Status: DC

## 2010-09-20 NOTE — Telephone Encounter (Signed)
He can switch but it is a twice a day medication. Easier to forget a dose. If he wants to switch, change to Metoprolol tartrate 50mg  twice a day. I sent in prescription.

## 2010-09-20 NOTE — Telephone Encounter (Signed)
Patient notified as instructed by telephone. 

## 2010-10-10 ENCOUNTER — Telehealth: Payer: Self-pay | Admitting: *Deleted

## 2010-10-10 NOTE — Telephone Encounter (Signed)
Pt would like to go somewhere to have his hearing checked and he wants to know who you would recommend in [place].  Please advise.

## 2010-10-11 NOTE — Telephone Encounter (Signed)
Will send pt to [facility].

## 2010-10-12 NOTE — Telephone Encounter (Signed)
Appt made for 10/15/2010 pt aware.

## 2010-10-18 ENCOUNTER — Encounter: Payer: Self-pay | Admitting: Family Medicine

## 2010-10-18 DIAGNOSIS — H9190 Unspecified hearing loss, unspecified ear: Secondary | ICD-10-CM | POA: Insufficient documentation

## 2010-10-19 ENCOUNTER — Encounter: Payer: Self-pay | Admitting: Family Medicine

## 2010-12-19 ENCOUNTER — Other Ambulatory Visit: Payer: Self-pay | Admitting: Family Medicine

## 2010-12-19 DIAGNOSIS — R7989 Other specified abnormal findings of blood chemistry: Secondary | ICD-10-CM

## 2010-12-19 DIAGNOSIS — E559 Vitamin D deficiency, unspecified: Secondary | ICD-10-CM

## 2010-12-19 DIAGNOSIS — I10 Essential (primary) hypertension: Secondary | ICD-10-CM

## 2010-12-19 DIAGNOSIS — R972 Elevated prostate specific antigen [PSA]: Secondary | ICD-10-CM

## 2010-12-19 DIAGNOSIS — E785 Hyperlipidemia, unspecified: Secondary | ICD-10-CM

## 2010-12-19 DIAGNOSIS — M1990 Unspecified osteoarthritis, unspecified site: Secondary | ICD-10-CM

## 2010-12-24 ENCOUNTER — Other Ambulatory Visit (INDEPENDENT_AMBULATORY_CARE_PROVIDER_SITE_OTHER): Payer: Medicare Other

## 2010-12-24 DIAGNOSIS — R7989 Other specified abnormal findings of blood chemistry: Secondary | ICD-10-CM

## 2010-12-24 DIAGNOSIS — E785 Hyperlipidemia, unspecified: Secondary | ICD-10-CM

## 2010-12-24 DIAGNOSIS — M1990 Unspecified osteoarthritis, unspecified site: Secondary | ICD-10-CM

## 2010-12-24 DIAGNOSIS — R972 Elevated prostate specific antigen [PSA]: Secondary | ICD-10-CM

## 2010-12-24 DIAGNOSIS — I10 Essential (primary) hypertension: Secondary | ICD-10-CM

## 2010-12-24 DIAGNOSIS — E559 Vitamin D deficiency, unspecified: Secondary | ICD-10-CM

## 2010-12-24 LAB — CBC WITH DIFFERENTIAL/PLATELET
Basophils Absolute: 0 10*3/uL (ref 0.0–0.1)
Eosinophils Relative: 3.2 % (ref 0.0–5.0)
HCT: 37.3 % — ABNORMAL LOW (ref 39.0–52.0)
Lymphocytes Relative: 31.8 % (ref 12.0–46.0)
Lymphs Abs: 1.8 10*3/uL (ref 0.7–4.0)
Monocytes Relative: 7.4 % (ref 3.0–12.0)
Platelets: 164 10*3/uL (ref 150.0–400.0)
RDW: 13.5 % (ref 11.5–14.6)
WBC: 5.6 10*3/uL (ref 4.5–10.5)

## 2010-12-24 LAB — LIPID PANEL
Cholesterol: 203 mg/dL — ABNORMAL HIGH (ref 0–200)
HDL: 52.4 mg/dL (ref >39.00)
Total CHOL/HDL Ratio: 4
Triglycerides: 80 mg/dL (ref 0.0–149.0)
VLDL: 16 mg/dL (ref 0.0–40.0)

## 2010-12-24 LAB — TSH: TSH: 2.4 u[IU]/mL (ref 0.35–5.50)

## 2010-12-24 LAB — RENAL FUNCTION PANEL
Creatinine, Ser: 1.3 mg/dL (ref 0.4–1.5)
GFR: 56.87 mL/min — ABNORMAL LOW (ref >60.00)
Glucose, Bld: 97 mg/dL (ref 70–99)
Phosphorus: 3.1 mg/dL (ref 2.3–4.6)
Potassium: 4.5 mEq/L (ref 3.5–5.1)
Sodium: 139 mEq/L (ref 135–145)

## 2010-12-24 LAB — LDL CHOLESTEROL, DIRECT: Direct LDL: 126.6 mg/dL

## 2010-12-24 LAB — HEPATIC FUNCTION PANEL
AST: 23 U/L (ref 0–37)
Albumin: 3.8 g/dL (ref 3.5–5.2)
Total Bilirubin: 0.9 mg/dL (ref 0.3–1.2)

## 2010-12-25 LAB — MICROALBUMIN / CREATININE URINE RATIO
Creatinine,U: 175.3 mg/dL
Microalb Creat Ratio: 2.5 mg/g (ref 0.0–30.0)
Microalb, Ur: 4.3 mg/dL — ABNORMAL HIGH (ref 0.0–1.9)

## 2010-12-27 ENCOUNTER — Encounter: Payer: Self-pay | Admitting: Family Medicine

## 2010-12-27 ENCOUNTER — Ambulatory Visit (INDEPENDENT_AMBULATORY_CARE_PROVIDER_SITE_OTHER): Payer: Medicare Other | Admitting: Family Medicine

## 2010-12-27 VITALS — BP 132/72 | HR 60 | Temp 97.6°F | Ht 69.0 in | Wt 196.2 lb

## 2010-12-27 DIAGNOSIS — R7989 Other specified abnormal findings of blood chemistry: Secondary | ICD-10-CM

## 2010-12-27 DIAGNOSIS — R259 Unspecified abnormal involuntary movements: Secondary | ICD-10-CM

## 2010-12-27 DIAGNOSIS — Z0000 Encounter for general adult medical examination without abnormal findings: Secondary | ICD-10-CM

## 2010-12-27 DIAGNOSIS — H9190 Unspecified hearing loss, unspecified ear: Secondary | ICD-10-CM

## 2010-12-27 DIAGNOSIS — N400 Enlarged prostate without lower urinary tract symptoms: Secondary | ICD-10-CM

## 2010-12-27 DIAGNOSIS — Z23 Encounter for immunization: Secondary | ICD-10-CM

## 2010-12-27 DIAGNOSIS — N529 Male erectile dysfunction, unspecified: Secondary | ICD-10-CM

## 2010-12-27 DIAGNOSIS — R972 Elevated prostate specific antigen [PSA]: Secondary | ICD-10-CM

## 2010-12-27 DIAGNOSIS — D485 Neoplasm of uncertain behavior of skin: Secondary | ICD-10-CM

## 2010-12-27 DIAGNOSIS — E785 Hyperlipidemia, unspecified: Secondary | ICD-10-CM

## 2010-12-27 DIAGNOSIS — E559 Vitamin D deficiency, unspecified: Secondary | ICD-10-CM

## 2010-12-27 DIAGNOSIS — I10 Essential (primary) hypertension: Secondary | ICD-10-CM

## 2010-12-27 MED ORDER — FINASTERIDE 5 MG PO TABS: 5.0000 mg | ORAL_TABLET | Freq: Every day | ORAL | Status: DC

## 2010-12-27 MED ORDER — ALPRAZOLAM 0.25 MG PO TABS: ORAL_TABLET | ORAL | Status: DC

## 2010-12-27 MED ORDER — LISINOPRIL 5 MG PO TABS: 5.0000 mg | ORAL_TABLET | Freq: Every day | ORAL | Status: DC

## 2010-12-27 MED ORDER — TADALAFIL 5 MG PO TABS: 5.0000 mg | ORAL_TABLET | Freq: Every day | ORAL | Status: DC | PRN

## 2010-12-27 MED ORDER — AMLODIPINE BESYLATE 10 MG PO TABS: 10.0000 mg | ORAL_TABLET | Freq: Every day | ORAL | Status: DC

## 2010-12-27 MED ORDER — DOXAZOSIN MESYLATE 4 MG PO TABS: 4.0000 mg | ORAL_TABLET | Freq: Every day | ORAL | Status: DC

## 2010-12-27 MED ORDER — METOPROLOL TARTRATE 50 MG PO TABS: 50.0000 mg | ORAL_TABLET | Freq: Two times a day (BID) | ORAL | Status: DC

## 2010-12-27 NOTE — Assessment & Plan Note (Signed)
Adequate control. Cont curr meds. BP Readings from Last 3 Encounters:  12/27/10 132/72  05/31/10 124/78  12/20/09 156/80

## 2010-12-27 NOTE — Assessment & Plan Note (Signed)
Good nos, not great. Always restrict fatty food to get LDL lower. Lab Results  Component Value Date   CHOL 203* 12/24/2010   HDL 52.40 12/24/2010   LDLCALC 127* 12/13/2009   LDLDIRECT 126.6 12/24/2010   TRIG 80.0 12/24/2010   CHOLHDL 4 12/24/2010

## 2010-12-27 NOTE — Progress Notes (Signed)
He has a lesion  Subjective:    Patient ID: Shawn Andrews, male    DOB: 1928-08-16, 77 y.o.   MRN: [id number]  HPI Pt here for Comp Exam. He feels well and has no complaints. He has a lesion on his lower right leg that started as a foreign body an now is indurated.  He needs his medications refilled, some sent off, some local.    Review of Systems  Constitutional: Negative for fever, chills, diaphoresis, appetite change, fatigue and unexpected weight change.  HENT: Positive for hearing loss (Needs hearing aids but doesn't want one yet.). Negative for ear pain, tinnitus and ear discharge.   Eyes: Negative for pain, discharge and visual disturbance.       Visual acuity off slightly. Sees Eye doctor yearly.  Has early cataracts.  Respiratory: Negative for cough, shortness of breath and wheezing.   Cardiovascular: Negative for chest pain and palpitations.       No SOB w/ exertion  Gastrointestinal: Negative for nausea, vomiting, abdominal pain, diarrhea, constipation and blood in stool.       No heartburn or swallowing problems.  Genitourinary: Negative for dysuria, frequency, flank pain and difficulty urinating.       No nocturia  Musculoskeletal: Positive for arthralgias (mild, normal with exertion.). Negative for myalgias and back pain.  Skin: Negative for rash.       No itching or dryness.  Neurological: Positive for weakness. Negative for tremors and numbness.       No tingling or balance problems.  Hematological: Negative for adenopathy. Does not bruise/bleed easily.  Psychiatric/Behavioral: Negative for dysphoric mood and agitation.       Objective:   Physical Exam  Constitutional: He is oriented to person, place, and time. He appears well-developed and well-nourished. No distress.  HENT:  Head: Normocephalic and atraumatic.  Right Ear: External ear normal.  Left Ear: External ear normal.  Nose: Nose normal.  Mouth/Throat: Oropharynx is clear and moist.  Eyes: Conjunctivae  and EOM are normal. Pupils are equal, round, and reactive to light. Right eye exhibits no discharge. Left eye exhibits no discharge. No scleral icterus.  Neck: Normal range of motion. Neck supple. No thyromegaly present.  Cardiovascular: Normal rate, regular rhythm, normal heart sounds and intact distal pulses.   No murmur heard. Pulmonary/Chest: Effort normal and breath sounds normal. No respiratory distress. He has no wheezes.  Abdominal: Soft. Bowel sounds are normal. He exhibits no distension and no mass. There is no tenderness. There is no rebound and no guarding.  Genitourinary: Penis normal.       No rectal done.  Musculoskeletal: Normal range of motion. He exhibits no edema.  Lymphadenopathy:    He has no cervical adenopathy.  Neurological: He is alert and oriented to person, place, and time. Coordination normal.  Skin: Skin is warm and dry. No rash noted. He is not diaphoretic.       1 cm sclerotic lesion with surrounding erythema on the inner right lower leg.  Psychiatric: He has a normal mood and affect. His behavior is normal. Judgment and thought content normal.          Assessment & Plan:  HMPE  I have personally reviewed the Medicare Annual Wellness questionnaire and have noted 1. The patient's medical and social history 2. Their use of alcohol, tobacco or illicit drugs 3. Their current medications and supplements 4. The patient's functional ability including ADL's, fall risks, home safety risks and hearing or visual  impairment. 5. Diet and physical activities 6. Evidence for depression or mood disorders No cognitive decline noted.

## 2010-12-27 NOTE — Assessment & Plan Note (Signed)
PSA stable on Proscar. Has decreased generally but on Proscar, must increase as a result. Cont to monitor. Lab Results  Component Value Date   PSA 4.87* 12/24/2010   PSA 5.28* 12/13/2009   PSA 5.28* 10/25/2008

## 2010-12-27 NOTE — Assessment & Plan Note (Signed)
Well controlled with Cialis. Wants to continue.

## 2010-12-27 NOTE — Patient Instructions (Addendum)
Please schedule 30 minute appt for lesion removal of right inner lower leg. Refer to GI for colonoscopy. Had been seen by Dr Laural Benes in the past, the last time approx 10 years ago.  Given Flu and Pneumonia immun today. Look into Zosavax (shingles shot) at the drug store.

## 2010-12-27 NOTE — Assessment & Plan Note (Signed)
Normal this year.  

## 2010-12-27 NOTE — Assessment & Plan Note (Signed)
Adequate, cont replacement.

## 2010-12-27 NOTE — Assessment & Plan Note (Signed)
Lesion on inner right lower leg looks suspicious aly th0ough pt says started as foreign body that he has picked at.

## 2010-12-27 NOTE — Assessment & Plan Note (Signed)
Able to control so not "so embarrassing socially" with Xanax. It does not make him dizzy or bother him in any way.

## 2010-12-27 NOTE — Assessment & Plan Note (Signed)
See above

## 2010-12-27 NOTE — Assessment & Plan Note (Signed)
Adequate. Not willing yet to invest in hearing aids.

## 2010-12-28 ENCOUNTER — Other Ambulatory Visit: Payer: Self-pay | Admitting: Internal Medicine

## 2010-12-28 MED ORDER — ALPRAZOLAM 0.25 MG PO TABS: ORAL_TABLET | ORAL | Status: DC

## 2010-12-28 MED ORDER — DOXAZOSIN MESYLATE 4 MG PO TABS: 4.0000 mg | ORAL_TABLET | Freq: Every day | ORAL | Status: DC

## 2010-12-28 MED ORDER — LISINOPRIL 5 MG PO TABS: 5.0000 mg | ORAL_TABLET | Freq: Every day | ORAL | Status: DC

## 2010-12-28 MED ORDER — FINASTERIDE 5 MG PO TABS: 5.0000 mg | ORAL_TABLET | Freq: Every day | ORAL | Status: DC

## 2010-12-28 MED ORDER — AMLODIPINE BESYLATE 10 MG PO TABS: 10.0000 mg | ORAL_TABLET | Freq: Every day | ORAL | Status: DC

## 2010-12-28 NOTE — Telephone Encounter (Signed)
Patient called in and wanted refills on these medication.

## 2010-12-28 NOTE — Telephone Encounter (Signed)
Medication phoned to pharmacy.  

## 2010-12-28 NOTE — Telephone Encounter (Signed)
Please call in the xanax.  I sent the others.  Thanks.

## 2011-01-02 ENCOUNTER — Ambulatory Visit (INDEPENDENT_AMBULATORY_CARE_PROVIDER_SITE_OTHER): Payer: Medicare Other | Admitting: Family Medicine

## 2011-01-02 ENCOUNTER — Encounter: Payer: Self-pay | Admitting: Family Medicine

## 2011-01-02 DIAGNOSIS — D485 Neoplasm of uncertain behavior of skin: Secondary | ICD-10-CM

## 2011-01-02 NOTE — Progress Notes (Signed)
  Subjective:    Patient ID: Shawn Andrews, male    DOB: 1928-10-05, 77 y.o.   MRN: [id number]  HPI Pt here for lesion removal from his right inner lower leg which has been growing for the last several months. He thinks it began as a bite of some time in the past. He is fairly "hot" about his prescriptions to the mail order pharmacy, being told that they did not receive them from [place] when in fact the scripts had been sitting at the pharmacy since the day he was here but "hadn't yet been entered into the computer" because they "have a backlog."   Review of Systems  Constitutional: Negative for fever, chills, diaphoresis, activity change, appetite change and fatigue.       Mild congestion since his flu shot last week.  HENT: Negative for hearing loss, ear pain, congestion, sore throat, rhinorrhea, neck pain, neck stiffness, postnasal drip, sinus pressure, tinnitus and ear discharge.   Eyes: Negative for pain, discharge and visual disturbance.  Respiratory: Negative for cough, shortness of breath and wheezing.   Cardiovascular: Negative for chest pain and palpitations.       No SOB w/ exertion  Gastrointestinal:       No heartburn or swallowing problems.  Genitourinary:       No nocturia  Skin:       No itching or dryness. Lesion on right inner lower leg.  Neurological:       No tingling or balance problems.  All other systems reviewed and are negative.       Objective:   Physical Exam        Assessment & Plan:

## 2011-01-02 NOTE — Assessment & Plan Note (Signed)
Lesion of right inner lower leg prepped and draped in usual sterile manner. 1% lido w/ epi used as local, approx 2.5 cc.  Shave biopsy technique used to excise the lesion and hyfrecation undertaken to ablate the skin bed and some subcutaneous tissue in dessication and currretage technique. Sterile dressing with neosporin placed. Pt tolerated well. Lesion sent to pathology.

## 2011-01-02 NOTE — Patient Instructions (Signed)
Keep clean and dry for 24 hours. Then wash with soap and water. Will report Pathology on phone tree.

## 2011-01-02 NOTE — Progress Notes (Signed)
Patient stated that he has checked with his mail order pharmacy [facility] and prescriptions were not sent in at last office visit and that he had called back later and the prescriptions were sent to [facility] in error. Called and spoke to pharmacist and cancelled prescriptions sent to [facility]. Patient requested that the Alprazolam be called to [facility] and this rx was called in. Called and spoke to [place] at patient's mail order pharmacy [phone]) and was informed that they have received the prescriptions sent in at patient's last office visit, but are behind and they have not been put in their system yet. Kathlene November stated that he will process these at this time and will put a rush on them to mail them to the patient.

## 2011-01-10 ENCOUNTER — Encounter: Payer: Self-pay | Admitting: Family Medicine

## 2011-01-10 ENCOUNTER — Ambulatory Visit: Payer: Medicare Other | Admitting: Family Medicine

## 2011-01-17 ENCOUNTER — Ambulatory Visit: Payer: Medicare Other | Admitting: Family Medicine

## 2011-01-22 HISTORY — PX: CATARACT EXTRACTION: SUR2

## 2011-02-19 ENCOUNTER — Encounter: Payer: Self-pay | Admitting: Family Medicine

## 2011-05-31 ENCOUNTER — Telehealth: Payer: Self-pay | Admitting: Family Medicine

## 2011-05-31 ENCOUNTER — Other Ambulatory Visit: Payer: Self-pay | Admitting: Orthopedic Surgery

## 2011-05-31 DIAGNOSIS — IMO0002 Reserved for concepts with insufficient information to code with codable children: Secondary | ICD-10-CM

## 2011-05-31 DIAGNOSIS — M545 Low back pain, unspecified: Secondary | ICD-10-CM

## 2011-06-06 ENCOUNTER — Ambulatory Visit
Admission: RE | Admit: 2011-06-06 | Discharge: 2011-06-06 | Disposition: A | Discharge status: Home / Self Care | Payer: Medicare Other | Source: Ambulatory Visit | Attending: Orthopedic Surgery | Admitting: Orthopedic Surgery

## 2011-06-06 DIAGNOSIS — M545 Low back pain, unspecified: Secondary | ICD-10-CM

## 2011-06-06 DIAGNOSIS — IMO0002 Reserved for concepts with insufficient information to code with codable children: Secondary | ICD-10-CM

## 2011-06-17 ENCOUNTER — Encounter: Payer: Self-pay | Admitting: Family Medicine

## 2011-06-17 DIAGNOSIS — M5417 Radiculopathy, lumbosacral region: Secondary | ICD-10-CM | POA: Insufficient documentation

## 2011-07-26 ENCOUNTER — Other Ambulatory Visit: Payer: Self-pay | Admitting: *Deleted

## 2011-07-28 MED ORDER — ALPRAZOLAM 0.25 MG PO TABS: ORAL_TABLET | ORAL | Status: DC

## 2011-07-28 NOTE — Telephone Encounter (Signed)
plz phone in. 

## 2011-07-29 NOTE — Telephone Encounter (Signed)
Rx called in as directed.   

## 2011-08-23 ENCOUNTER — Encounter: Payer: Self-pay | Admitting: Family Medicine

## 2011-08-23 ENCOUNTER — Ambulatory Visit: Payer: Medicare Other | Admitting: Family Medicine

## 2011-08-23 ENCOUNTER — Ambulatory Visit (INDEPENDENT_AMBULATORY_CARE_PROVIDER_SITE_OTHER): Payer: Medicare Other | Admitting: Family Medicine

## 2011-08-23 VITALS — BP 142/80 | HR 73 | Temp 99.7°F | Wt 196.8 lb

## 2011-08-23 DIAGNOSIS — J189 Pneumonia, unspecified organism: Secondary | ICD-10-CM

## 2011-08-23 MED ORDER — AZITHROMYCIN 250 MG PO TABS: ORAL_TABLET | ORAL | Status: AC

## 2011-08-23 NOTE — Progress Notes (Signed)
Patient did not bring meds or meds list.  States that we have them.  Ninetta Lights, CMA

## 2011-08-23 NOTE — Progress Notes (Signed)
  Patient Name: Shawn Andrews Date of Birth: 08-28-1928 Medical Record Number: [id number]  History of Present Illness:  Patent presents with sneezing, cough - productive of yellow-green sputum, malaise and fever to 100-101 .  Coughing, aching, fever. No known sick contacts. No known tobacco history.   Walks most days every day.  ? recent exposure to others with similar symptoms.   The patent denies sore throat as the primary complaint. Denies sthortness of breath/wheezing, high fever, chest pain, rhinits for more than 14 days, otalgia, facial pain, abdominal pain, changes in bowel or bladder.  PMH, PHS, Allergies, Problem List, Medications, Family History, and Social History have all been reviewed.  Review of Systems: as above, eating and drinking - tolerating PO. Urinating normally. No excessive vomitting or diarrhea. O/w as above.  Physical Exam:  Filed Vitals:   08/23/11 1056  BP: 142/80  Pulse: 73  Temp: 99.7 F (37.6 C)  TempSrc: Oral  Weight: 196 lb 12 oz (89.245 kg)  SpO2: 98%    GEN: WDWN, Non-toxic, Atraumatic, normocephalic. A and O x 3. HEENT: Oropharynx clear without exudate, MMM, no significant LAD, mild rhinnorhea Ears: TM clear, COL visualized with good landmarks CV: RRR, no m/g/r. Pulm: rhonchi in B lung bases, no wheezing.  EXT: no c/c/e Psych: well oriented, neither depressed nor anxious in appearance   1. Walking pneumonia    Rhonchi in bases with fever zpak and supportive care  Orders Today:  No orders of the defined types were placed in this encounter.    Medications Today: (Includes new updates added during medication reconciliation) Meds ordered this encounter  Medications  . azithromycin (ZITHROMAX Z-PAK) 250 MG tablet    Sig: Take 2 tablets (500 mg) on  Day 1,  followed by 1 tablet (250 mg) once daily on Days 2 through 5.    Dispense:  6 each    Refill:  0

## 2011-11-13 ENCOUNTER — Ambulatory Visit (INDEPENDENT_AMBULATORY_CARE_PROVIDER_SITE_OTHER): Payer: Medicare Other | Admitting: Family Medicine

## 2011-11-13 ENCOUNTER — Encounter: Payer: Self-pay | Admitting: Family Medicine

## 2011-11-13 VITALS — BP 126/68 | HR 56 | Temp 97.5°F | Wt 198.8 lb

## 2011-11-13 DIAGNOSIS — I10 Essential (primary) hypertension: Secondary | ICD-10-CM

## 2011-11-13 DIAGNOSIS — H9190 Unspecified hearing loss, unspecified ear: Secondary | ICD-10-CM

## 2011-11-13 DIAGNOSIS — N400 Enlarged prostate without lower urinary tract symptoms: Secondary | ICD-10-CM

## 2011-11-13 DIAGNOSIS — N529 Male erectile dysfunction, unspecified: Secondary | ICD-10-CM

## 2011-11-13 DIAGNOSIS — H918X9 Other specified hearing loss, unspecified ear: Secondary | ICD-10-CM

## 2011-11-13 DIAGNOSIS — R259 Unspecified abnormal involuntary movements: Secondary | ICD-10-CM

## 2011-11-13 DIAGNOSIS — R972 Elevated prostate specific antigen [PSA]: Secondary | ICD-10-CM

## 2011-11-13 MED ORDER — METOPROLOL TARTRATE 50 MG PO TABS: 50.0000 mg | ORAL_TABLET | Freq: Two times a day (BID) | ORAL | Status: DC

## 2011-11-13 MED ORDER — ALPRAZOLAM 0.25 MG PO TABS: ORAL_TABLET | ORAL | Status: DC

## 2011-11-13 MED ORDER — DOXAZOSIN MESYLATE 4 MG PO TABS: 4.0000 mg | ORAL_TABLET | Freq: Every day | ORAL | Status: DC

## 2011-11-13 MED ORDER — AMLODIPINE BESYLATE 10 MG PO TABS: 10.0000 mg | ORAL_TABLET | Freq: Every day | ORAL | Status: DC

## 2011-11-13 MED ORDER — LISINOPRIL 5 MG PO TABS: 5.0000 mg | ORAL_TABLET | Freq: Every day | ORAL | Status: DC

## 2011-11-13 MED ORDER — FINASTERIDE 5 MG PO TABS: 5.0000 mg | ORAL_TABLET | Freq: Every day | ORAL | Status: DC

## 2011-11-13 NOTE — Assessment & Plan Note (Signed)
On proscar, per pt has had bx x3 by urology, all normal.

## 2011-11-13 NOTE — Patient Instructions (Signed)
Get flu shot at pharmacy. meds refilled today. Good to see you today, call us with questions. Return in May for medicare wellness visit.

## 2011-11-13 NOTE — Assessment & Plan Note (Signed)
Chronic, stable.  Has had biopsy x3 - normal per pt.

## 2011-11-13 NOTE — Assessment & Plan Note (Signed)
Continue xanax prn.   

## 2011-11-13 NOTE — Assessment & Plan Note (Signed)
Did not react well at all to low dose cialis. May consider referral to local urologist but would like to wait until May to further discuss.

## 2011-11-13 NOTE — Progress Notes (Signed)
  Subjective:    Patient ID: Shawn Andrews, male    DOB: 10/15/1928, 78 y.o.   MRN: [id number]  HPI CC: med refill  Would like CPE to be 05/2011  HTN - No HA, vision changes, CP/tightness, SOB, leg swelling.  Compliant with meds.  HLD - compliant with 1 gm fish oil bid.  BPH - has had 3 biopsies in past, all normal per pt.  ED - having trouble with erections.  cialis at 5mg  daily did not help - caused worsening indigestion and overall feeling ill.  H/o L sciatic nerve pain - worse pain with walking prolonged (>86min).  Sees [place] doctor for shots.  Declines flu shot. Enjoys RV'ing.  Denies anhedonia, depression. No falls in last year.  Past Medical History  Diagnosis Date  . BPH (benign prostatic hypertrophy) 01/21/98    has had 3 biopsies in past ([facility])  . Hypertension 05/22/03  . Hyperlipidemia 01/21/02  . 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 Constitutional: He appears well-developed and well-nourished. No distress.  HENT:  Head: Normocephalic and atraumatic.  Mouth/Throat: Oropharynx is clear and moist. No oropharyngeal exudate.  Eyes: Conjunctivae normal and EOM are normal. Pupils are equal, round, and reactive to light. No scleral icterus.  Neck: Normal range of motion. Neck supple. Carotid bruit is not present.  Cardiovascular: Normal rate, regular rhythm, normal heart sounds and intact distal pulses.   No murmur heard. Pulmonary/Chest: Effort normal and breath sounds normal. No respiratory distress. He has no wheezes. He has no rales.  Musculoskeletal: He exhibits no edema.  Lymphadenopathy:    He has no cervical adenopathy.  Skin: Skin is warm and dry. No rash noted.  Psychiatric: He has a normal mood and affect.       Assessment & Plan:

## 2011-11-13 NOTE — Assessment & Plan Note (Signed)
Chronic, stable. Continue meds. Refilled x 3 mo supply to [facility]

## 2011-12-23 ENCOUNTER — Other Ambulatory Visit: Payer: Medicare Other

## 2011-12-30 ENCOUNTER — Encounter: Payer: Medicare Other | Admitting: Family Medicine

## 2012-05-20 ENCOUNTER — Other Ambulatory Visit: Payer: Self-pay | Admitting: *Deleted

## 2012-05-20 MED ORDER — ALPRAZOLAM 0.25 MG PO TABS: ORAL_TABLET | ORAL | Status: DC

## 2012-05-20 NOTE — Telephone Encounter (Signed)
Last filled 11/13/11

## 2012-05-20 NOTE — Telephone Encounter (Signed)
Px written for call in   

## 2012-05-20 NOTE — Telephone Encounter (Signed)
Rx called into pharmacy as prescribed  

## 2012-06-08 ENCOUNTER — Other Ambulatory Visit: Payer: Self-pay | Admitting: Family Medicine

## 2012-06-08 DIAGNOSIS — E785 Hyperlipidemia, unspecified: Secondary | ICD-10-CM

## 2012-06-08 DIAGNOSIS — N400 Enlarged prostate without lower urinary tract symptoms: Secondary | ICD-10-CM

## 2012-06-08 DIAGNOSIS — I10 Essential (primary) hypertension: Secondary | ICD-10-CM

## 2012-06-12 ENCOUNTER — Other Ambulatory Visit (INDEPENDENT_AMBULATORY_CARE_PROVIDER_SITE_OTHER): Payer: Medicare Other

## 2012-06-12 ENCOUNTER — Encounter: Payer: Self-pay | Admitting: Family Medicine

## 2012-06-12 DIAGNOSIS — I10 Essential (primary) hypertension: Secondary | ICD-10-CM

## 2012-06-12 DIAGNOSIS — N400 Enlarged prostate without lower urinary tract symptoms: Secondary | ICD-10-CM

## 2012-06-12 DIAGNOSIS — E785 Hyperlipidemia, unspecified: Secondary | ICD-10-CM

## 2012-06-12 DIAGNOSIS — R7989 Other specified abnormal findings of blood chemistry: Secondary | ICD-10-CM

## 2012-06-12 DIAGNOSIS — R972 Elevated prostate specific antigen [PSA]: Secondary | ICD-10-CM

## 2012-06-12 LAB — LIPID PANEL
Cholesterol: 211 mg/dL — ABNORMAL HIGH (ref 0–200)
Triglycerides: 127 mg/dL (ref 0.0–149.0)

## 2012-06-12 LAB — BASIC METABOLIC PANEL
CO2: 30 mEq/L (ref 19–32)
Calcium: 9.4 mg/dL (ref 8.4–10.5)
Creatinine, Ser: 1.4 mg/dL (ref 0.4–1.5)
Glucose, Bld: 97 mg/dL (ref 70–99)
Sodium: 138 mEq/L (ref 135–145)

## 2012-06-15 ENCOUNTER — Encounter: Payer: Self-pay | Admitting: Family Medicine

## 2012-06-15 DIAGNOSIS — Z0000 Encounter for general adult medical examination without abnormal findings: Secondary | ICD-10-CM | POA: Insufficient documentation

## 2012-06-19 ENCOUNTER — Ambulatory Visit (INDEPENDENT_AMBULATORY_CARE_PROVIDER_SITE_OTHER): Payer: Medicare Other | Admitting: Family Medicine

## 2012-06-19 ENCOUNTER — Encounter: Payer: Self-pay | Admitting: Family Medicine

## 2012-06-19 VITALS — BP 124/72 | HR 64 | Temp 98.0°F | Ht 69.0 in | Wt 205.2 lb

## 2012-06-19 DIAGNOSIS — R259 Unspecified abnormal involuntary movements: Secondary | ICD-10-CM

## 2012-06-19 DIAGNOSIS — R972 Elevated prostate specific antigen [PSA]: Secondary | ICD-10-CM

## 2012-06-19 DIAGNOSIS — H9193 Unspecified hearing loss, bilateral: Secondary | ICD-10-CM

## 2012-06-19 DIAGNOSIS — E785 Hyperlipidemia, unspecified: Secondary | ICD-10-CM

## 2012-06-19 DIAGNOSIS — N529 Male erectile dysfunction, unspecified: Secondary | ICD-10-CM

## 2012-06-19 DIAGNOSIS — R7989 Other specified abnormal findings of blood chemistry: Secondary | ICD-10-CM

## 2012-06-19 DIAGNOSIS — I10 Essential (primary) hypertension: Secondary | ICD-10-CM

## 2012-06-19 DIAGNOSIS — Z0000 Encounter for general adult medical examination without abnormal findings: Secondary | ICD-10-CM

## 2012-06-19 DIAGNOSIS — H918X9 Other specified hearing loss, unspecified ear: Secondary | ICD-10-CM

## 2012-06-19 DIAGNOSIS — N400 Enlarged prostate without lower urinary tract symptoms: Secondary | ICD-10-CM

## 2012-06-19 MED ORDER — SILDENAFIL CITRATE 50 MG PO TABS: 25.0000 mg | ORAL_TABLET | Freq: Every day | ORAL | Status: DC | PRN

## 2012-06-19 NOTE — Assessment & Plan Note (Signed)
Satisfied with use of amplifiers.

## 2012-06-19 NOTE — Assessment & Plan Note (Signed)
Chronic, stable. Will stop PSA testing unless pt develops sxs.

## 2012-06-19 NOTE — Assessment & Plan Note (Signed)
longterm on xanax . Does not desire change.  Advised if any falls or increased dizziness would want switch. States it does not make him dizzy or bother him in any way.

## 2012-06-19 NOTE — Assessment & Plan Note (Signed)
Will stop PSA testing unless pt develops sxs.

## 2012-06-19 NOTE — Assessment & Plan Note (Signed)
I have personally reviewed the Medicare Annual Wellness questionnaire and have noted 1. The patient's medical and social history 2. Their use of alcohol, tobacco or illicit drugs 3. Their current medications and supplements 4. The patient's functional ability including ADL's, fall risks, home safety risks and hearing or visual impairment. 5. Diet and physical activity 6. Evidence for depression or mood disorders The patients weight, height, BMI have been recorded in the chart.  Hearing and vision has been addressed. I have made referrals, counseling and provided education to the patient based review of the above and I have provided the pt with a written personalized care plan for preventive services. See scanned questionairre. Advanced directives discussed: I asked pt to bring me copy of his living will.  Would want son to be HCPOA.  Reviewed preventative protocols and updated unless pt declined. DRE/PSA reassuring.  Given normal prior evaluations (3 biopsies) and PSA trending down, discussed discontinuing testing.  Pt agrees.

## 2012-06-19 NOTE — Assessment & Plan Note (Signed)
reviewed #s with patient.  Stable numbers.  On fish oil and flax seed oil - may do trial off fish oil and will reassess next fasting labs.  Lab Results  Component Value Date   CHOL 211* 06/12/2012   HDL 49.30 06/12/2012   LDLCALC 127* 12/13/2009   LDLDIRECT 131.9 06/12/2012   TRIG 127.0 06/12/2012   CHOLHDL 4 06/12/2012

## 2012-06-19 NOTE — Assessment & Plan Note (Signed)
Normal this year - resolved.

## 2012-06-19 NOTE — Assessment & Plan Note (Signed)
Chronic, stable. Continue amlodipine, lisinopril and metoprolol. BP Readings from Last 3 Encounters:  06/19/12 124/72  11/13/11 126/68  08/23/11 142/80

## 2012-06-19 NOTE — Progress Notes (Signed)
Subjective:    Patient ID: Shawn Andrews, male    DOB: 10/06/1928, 79 y.o.   MRN: [id number]  HPI CC: medicare wellness visit  Sees PA at home yearly.  Tremors - takes xanax TID PRN tremor.  Gets 270# every 3 months.  Discussed fall risk and concerns with benzo's.  States has been stable on this dose for years, does not desire to change med.  HLD - on flax seed and fish oil.  Asks about stopping fish oil.  ED - cialis has helped but felt very poorly afterwards.  Interested in trial of viagra.  Stopped aspirin 2/2 bruising.  Passes vision screens today.   Hearing screen failed - uses amplifier which helps.   No falls in last year. Denies depression/anhedonia, sadness.  Preventative: Prostate cancer sceening - BPH - has had elevated PSA in past but nomal biopsies.  On cardura and finasteride. Td 2003 -  Declines today. Pneumovax 2012 completed zostavax - discussed. Declines.  H/o shingles in past. Flu shot 10/2011 Advanced directives: has living will at home. Son is HCPOA Development worker, international aid [place]).  Widowed, remarried July 07, 2007. Daughter deceased from metastatic melanoma Lives at [facility]  Medications and allergies reviewed and updated in chart.  Past histories reviewed and updated if relevant as below. Patient Active Problem List   Diagnosis Date Noted  . Medicare annual wellness visit, subsequent 06/15/2012  . Lumbosacral radiculopathy at L5 06/17/2011  . High frequency hearing loss 10/18/2010  . VITAMIN D DEFICIENCY 11/15/2008  . Neoplasm of uncertain behavior of skin 05/02/2008  . 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 HYPERGLYCEMIA 03/21/2005  . HYPERTENSION 05/22/2003  . HYPERLIPIDEMIA 01/21/2002  . TREMOR 01/22/2000  . ELEVATED PROSTATE SPECIFIC ANTIGEN 01/22/2000  . BENIGN PROSTATIC HYPERTROPHY 01/21/1998  . OSTEOARTHRITIS 01/22/1988   Past Medical History  Diagnosis Date  . BPH (benign prostatic hypertrophy) 01/21/98    has had 3 biopsies in past ([facility])   . Hypertension 05/22/03  . Hyperlipidemia 01/21/02  . Osteoarthritis 01/22/88    knees, lumbar spondylosis and listhesis  . Tremor    Past Surgical History  Procedure Laterality Date  . Knee arthroscopy  1990    right  . Finger surgery      right middle, partial traumatic amputation  . Knee surgery  04/2006    right partial knee replacement in [place] - rec ppx abx for any invasive procedure  . Cataract extraction  2013    bilateral   History  Substance Use Topics  . Smoking status: Former Smoker -- 0.00 packs/day for 0 years    Types: Pipe    Quit date: 12/26/2000  . Smokeless tobacco: Current User    Types: Chew     Comment: quit over 30 years ago  . Alcohol Use: 0.0 oz/week    0 drink(s) per week     Comment: Occasional beer   Family History  Problem Relation Age of Onset  . Hypertension Mother   . Heart disease Brother     MI  . Diabetes Brother    Allergies  Allergen Reactions  . Cialis (Tadalafil) Other (See Comments)    Indigestion and "felt like crap"  . Penicillins Swelling    Of tongue   Current Outpatient Prescriptions on File Prior to Visit  Medication Sig Dispense Refill  . ALPRAZolam (XANAX) 0.25 MG tablet Take one tablet by mouth 3 times a day as needed for tremor control.  270 tablet  0  . amLODipine (NORVASC) 10 MG tablet  Take 1 tablet (10 mg total) by mouth daily.  90 tablet  3  . doxazosin (CARDURA) 4 MG tablet Take 1 tablet (4 mg total) by mouth daily.  90 tablet  3  . finasteride (PROSCAR) 5 MG tablet Take 1 tablet (5 mg total) by mouth daily.  90 tablet  3  . lisinopril (PRINIVIL,ZESTRIL) 5 MG tablet Take 1 tablet (5 mg total) by mouth daily. Take one tablet by mouth in the morning  90 tablet  3  . metoprolol (LOPRESSOR) 50 MG tablet Take 1 tablet (50 mg total) by mouth 2 (two) times daily.  180 tablet  3  . aspirin 81 MG EC tablet Take 81 mg by mouth 2 (two) times a week.       . Cyanocobalamin (VITAMIN B12 PO) Take by mouth daily.         No  current facility-administered medications on file prior to visit.    Review of Systems  Constitutional: Negative for fever, chills, activity change, appetite change, fatigue and unexpected weight change.  HENT: Negative for hearing loss and neck pain.   Eyes: Negative for visual disturbance.  Respiratory: Negative for cough, chest tightness, shortness of breath and wheezing.   Cardiovascular: Negative for chest pain, palpitations and leg swelling.  Gastrointestinal: Negative for nausea, vomiting, abdominal pain, diarrhea, constipation, blood in stool and abdominal distention.  Genitourinary: Negative for hematuria and difficulty urinating.  Musculoskeletal: Negative for myalgias and arthralgias.  Skin: Negative for rash.  Neurological: Negative for dizziness, seizures, syncope and headaches.  Hematological: Negative for adenopathy. Does not bruise/bleed easily.  Psychiatric/Behavioral: Negative for dysphoric mood. The patient is not nervous/anxious.        Objective:   Physical Exam  Nursing note and vitals reviewed. Constitutional: He is oriented to person, place, and time. He appears well-developed and well-nourished. No distress.  HENT:  Head: Normocephalic and atraumatic.  Right Ear: Tympanic membrane, external ear and ear canal normal. Decreased hearing is noted.  Left Ear: Tympanic membrane, external ear and ear canal normal. Decreased hearing is noted.  Nose: Nose normal.  Mouth/Throat: Oropharynx is clear and moist. No oropharyngeal exudate.  Eyes: Conjunctivae and EOM are normal. Pupils are equal, round, and reactive to light. No scleral icterus.  Neck: Normal range of motion. Neck supple. No thyromegaly present.  Cardiovascular: Normal rate, regular rhythm, normal heart sounds and intact distal pulses.   No murmur heard. Pulses:      Radial pulses are 2+ on the right side, and 2+ on the left side.  Pulmonary/Chest: Effort normal and breath sounds normal. No respiratory  distress. He has no wheezes. He has no rales.  Abdominal: Soft. Bowel sounds are normal. He exhibits no distension and no mass. There is no tenderness. There is no rebound and no guarding.  Genitourinary: Rectum normal. Rectal exam shows no external hemorrhoid, no internal hemorrhoid, no fissure, no mass, no tenderness and anal tone normal. Prostate is enlarged (30gm). Prostate is not tender.  Musculoskeletal: Normal range of motion. He exhibits no edema.  Lymphadenopathy:    He has no cervical adenopathy.  Neurological: He is alert and oriented to person, place, and time.  CN grossly intact, station and gait intact  Skin: Skin is warm and dry. No rash noted.  Psychiatric: He has a normal mood and affect. His behavior is normal. Judgment and thought content normal.       Assessment & Plan:

## 2012-06-19 NOTE — Assessment & Plan Note (Signed)
Did not tolerate cialis at all.  Desires trial of viagra - will prescribe 50mg  to take 1/2 to 1 pill PRN - advised not to take within 4 hours of cardura.

## 2012-06-19 NOTE — Patient Instructions (Addendum)
Call your insurance about the shingles shot to see if it is covered or how much it would cost and where is cheaper (here or pharmacy).  If you want to receive here, call for nurse visit.  Bring me copy of living will to update your chart. Trial of viagra - take 1/2 to 1 pill at a time as needed.  Don't take within 4 hours of cardura. Aspirin 2 times a week. Stop fish oil. We may talk about another tremor medication in the future called gabapentin. Good to see you today, call us with questions.

## 2012-06-29 ENCOUNTER — Encounter: Payer: Self-pay | Admitting: Family Medicine

## 2012-08-04 ENCOUNTER — Ambulatory Visit: Payer: Medicare Other | Admitting: Family Medicine

## 2012-11-13 ENCOUNTER — Telehealth: Payer: Self-pay

## 2012-11-13 NOTE — Telephone Encounter (Signed)
Pt left note requesting appt to discuss medicine changes for tremors before pt goes out of town in one month. Pt scheduled appt on 11/16/12 at 9 am.

## 2012-11-16 ENCOUNTER — Ambulatory Visit (INDEPENDENT_AMBULATORY_CARE_PROVIDER_SITE_OTHER): Payer: Medicare Other | Admitting: Family Medicine

## 2012-11-16 ENCOUNTER — Encounter: Payer: Self-pay | Admitting: Family Medicine

## 2012-11-16 VITALS — BP 118/70 | HR 64 | Temp 97.7°F | Wt 203.0 lb

## 2012-11-16 DIAGNOSIS — G250 Essential tremor: Secondary | ICD-10-CM

## 2012-11-16 MED ORDER — METOPROLOL TARTRATE 50 MG PO TABS: 50.0000 mg | ORAL_TABLET | Freq: Two times a day (BID) | ORAL | Status: DC

## 2012-11-16 MED ORDER — GABAPENTIN 100 MG PO CAPS: 100.0000 mg | ORAL_CAPSULE | Freq: Two times a day (BID) | ORAL | Status: DC

## 2012-11-16 MED ORDER — DOXAZOSIN MESYLATE 4 MG PO TABS: 4.0000 mg | ORAL_TABLET | Freq: Every day | ORAL | Status: DC

## 2012-11-16 MED ORDER — AMLODIPINE BESYLATE 10 MG PO TABS: 10.0000 mg | ORAL_TABLET | Freq: Every day | ORAL | Status: DC

## 2012-11-16 MED ORDER — LISINOPRIL 5 MG PO TABS: 5.0000 mg | ORAL_TABLET | Freq: Every day | ORAL | Status: DC

## 2012-11-16 MED ORDER — FINASTERIDE 5 MG PO TABS: 5.0000 mg | ORAL_TABLET | Freq: Every day | ORAL | Status: DC

## 2012-11-16 NOTE — Progress Notes (Signed)
  Subjective:    Patient ID: Shawn Andrews, male    DOB: 10/13/1928, 79 y.o.   MRN: [id number]  HPI CC: discuss meds for tremor.  Tremor - had been on xanax 0.25mg  TID long term.  Last refilled 04/2012, #270.  Mainly uses once a day - when he goes out to eat.  Longstanding tremor - over last 4 years.  Strong family history of tremor.  Intention tremor.  About to take a trip to [facility] [place] for 3 months.  Has received flu shot.  Past Medical History  Diagnosis Date  . BPH (benign prostatic hypertrophy) 01/21/98    has had 3 biopsies in past ([facility])  . Hypertension 05/22/03  . Hyperlipidemia 01/21/02  . Osteoarthritis 01/22/88    knees, lumbar spondylosis and listhesis  . Tremor      Review of Systems Per HPI    Objective:   Physical Exam  Nursing note and vitals reviewed. Constitutional: He appears well-developed and well-nourished. No distress.  Neurological:  Intention and resting tremor present L>R L handed individual       Assessment & Plan:

## 2012-11-16 NOTE — Assessment & Plan Note (Signed)
longterm on xanax - discussed pros/cons of benzodiazepine (mainly cons) Discussed trial of gabapentin - pt interested in this. Will prescribe gabapentin 100mg  nightly for 1 wk then increase to 100mg  twice daily. Pt will return in ~49mo for f/u.

## 2012-11-16 NOTE — Patient Instructions (Signed)
Let's do trial of gabapentin 100mg  at dinner or bedtime.  Take once daily for the next 7 days then we will increase to one pill twice daily. We will send in 1 month supply to [facility] for a trial.   Return to see me in beginnings of December for recheck prior to your trip. Good to see you today! Call us with questions.

## 2012-12-21 ENCOUNTER — Ambulatory Visit: Payer: Medicare Other | Admitting: Family Medicine

## 2012-12-22 ENCOUNTER — Ambulatory Visit (INDEPENDENT_AMBULATORY_CARE_PROVIDER_SITE_OTHER): Payer: Medicare Other | Admitting: Family Medicine

## 2012-12-22 ENCOUNTER — Encounter: Payer: Self-pay | Admitting: Family Medicine

## 2012-12-22 VITALS — BP 120/68 | HR 60 | Temp 97.6°F | Wt 204.4 lb

## 2012-12-22 DIAGNOSIS — G250 Essential tremor: Secondary | ICD-10-CM

## 2012-12-22 DIAGNOSIS — J329 Chronic sinusitis, unspecified: Secondary | ICD-10-CM

## 2012-12-22 DIAGNOSIS — R0982 Postnasal drip: Secondary | ICD-10-CM | POA: Insufficient documentation

## 2012-12-22 MED ORDER — LORATADINE 10 MG PO TABS: 10.0000 mg | ORAL_TABLET | Freq: Every day | ORAL | Status: DC | PRN

## 2012-12-22 MED ORDER — ALPRAZOLAM 0.25 MG PO TABS: ORAL_TABLET | ORAL | Status: DC

## 2012-12-22 NOTE — Progress Notes (Signed)
   Subjective:    Patient ID: Shawn Andrews, male    DOB: 03-Jul-1928, 79 y.o.   MRN: [id number]  HPI CC: f/u tremors.  ET - started on gabapentin last month - slowly titrated up to 100mg  bid.  Did have side effects of dry mouth and faint feeling with exertion.  Feels alprazolam worked better.  About to go on 3 month RV trip end of December to [place].  Nagging cough for months - from drainage in back of throat.  No fevers/chills.  No h/o allergic rhinitis but does have rhinorrhea and PNDrainage.  Past Medical History  Diagnosis Date  . BPH (benign prostatic hypertrophy) 01/21/98    has had 3 biopsies in past ([facility])  . Hypertension 05/22/03  . Hyperlipidemia 01/21/02  . Osteoarthritis 01/22/88    knees, lumbar spondylosis and listhesis  . Tremor      Review of Systems Per HPI    Objective:   Physical Exam  Nursing note and vitals reviewed. Constitutional: He appears well-developed and well-nourished. No distress.  HENT:  Head: Normocephalic and atraumatic.  Nose: Rhinorrhea present. No mucosal edema or nose lacerations.  Mouth/Throat: Uvula is midline, oropharynx is clear and moist and mucous membranes are normal. No oropharyngeal exudate, posterior oropharyngeal edema, posterior oropharyngeal erythema or tonsillar abscesses.  Some bilateral nasal mucosal erythema  no PNDrainage appreciated.     Assessment & Plan:

## 2012-12-22 NOTE — Addendum Note (Signed)
Addended by: Eustaquio Boyden on: 12/22/2012 01:14 PM   Modules accepted: Orders

## 2012-12-22 NOTE — Assessment & Plan Note (Signed)
Will return to alprazolam. Did not tolerate low dose gabapentin. consider trial of other ET med in future, but will not start new med in setting of upcoming travel.

## 2012-12-22 NOTE — Assessment & Plan Note (Signed)
Trial of claritin daily

## 2012-12-22 NOTE — Progress Notes (Signed)
Pre-visit discussion using our clinic review tool. No additional management support is needed unless otherwise documented below in the visit note.  

## 2012-12-22 NOTE — Patient Instructions (Signed)
Let's return to alprazolam 0.25mg  as needed for tremors.  Stop gabapentin, but may try 1 extra gabapentin 100mg  dose if you're going out to eat. Good to see you today, keep appointment for June, let me know sooner if any questions or concerns. I've refilled alprazolam for you today. For cough - I do think it's from post nasal drainage - try claritin 10mg  over the counter daily for the next 1-2 weeks to see if this improves the cough and drainage.

## 2013-06-18 ENCOUNTER — Other Ambulatory Visit: Payer: Self-pay | Admitting: Family Medicine

## 2013-06-18 ENCOUNTER — Other Ambulatory Visit (INDEPENDENT_AMBULATORY_CARE_PROVIDER_SITE_OTHER): Payer: Medicare Other

## 2013-06-18 DIAGNOSIS — E785 Hyperlipidemia, unspecified: Secondary | ICD-10-CM

## 2013-06-18 DIAGNOSIS — I10 Essential (primary) hypertension: Secondary | ICD-10-CM

## 2013-06-18 DIAGNOSIS — R972 Elevated prostate specific antigen [PSA]: Secondary | ICD-10-CM

## 2013-06-18 LAB — BASIC METABOLIC PANEL
BUN: 32 mg/dL — AB (ref 6–23)
CALCIUM: 9.4 mg/dL (ref 8.4–10.5)
CHLORIDE: 105 meq/L (ref 96–112)
CO2: 30 mEq/L (ref 19–32)
CREATININE: 1.5 mg/dL (ref 0.4–1.5)
GFR: 47.13 mL/min — ABNORMAL LOW (ref >60.00)
Glucose, Bld: 92 mg/dL (ref 70–99)
Potassium: 4.3 mEq/L (ref 3.5–5.1)
Sodium: 140 mEq/L (ref 135–145)

## 2013-06-18 LAB — LIPID PANEL
CHOL/HDL RATIO: 5
CHOLESTEROL: 202 mg/dL — AB (ref 0–200)
HDL: 43.6 mg/dL (ref >39.00)
LDL CALC: 143 mg/dL — AB (ref 0–99)
Triglycerides: 75 mg/dL (ref 0.0–149.0)
VLDL: 15 mg/dL (ref 0.0–40.0)

## 2013-06-18 LAB — TSH: TSH: 2.18 u[IU]/mL (ref 0.35–4.50)

## 2013-06-18 LAB — PSA: PSA: 4.14 ng/mL — ABNORMAL HIGH (ref 0.10–4.00)

## 2013-06-19 ENCOUNTER — Telehealth: Payer: Self-pay | Admitting: Family Medicine

## 2013-06-19 NOTE — Telephone Encounter (Signed)
Relevant patient education mailed to patient.  

## 2013-06-23 ENCOUNTER — Encounter: Payer: Self-pay | Admitting: Family Medicine

## 2013-06-23 ENCOUNTER — Ambulatory Visit (INDEPENDENT_AMBULATORY_CARE_PROVIDER_SITE_OTHER): Payer: Medicare Other | Admitting: Family Medicine

## 2013-06-23 VITALS — BP 126/78 | HR 62 | Temp 97.7°F | Ht 69.5 in | Wt 201.0 lb

## 2013-06-23 DIAGNOSIS — Z23 Encounter for immunization: Secondary | ICD-10-CM

## 2013-06-23 DIAGNOSIS — E785 Hyperlipidemia, unspecified: Secondary | ICD-10-CM

## 2013-06-23 DIAGNOSIS — N1831 Chronic kidney disease, stage 3a: Secondary | ICD-10-CM | POA: Insufficient documentation

## 2013-06-23 DIAGNOSIS — R972 Elevated prostate specific antigen [PSA]: Secondary | ICD-10-CM

## 2013-06-23 DIAGNOSIS — E559 Vitamin D deficiency, unspecified: Secondary | ICD-10-CM

## 2013-06-23 DIAGNOSIS — N183 Chronic kidney disease, stage 3 unspecified: Secondary | ICD-10-CM | POA: Insufficient documentation

## 2013-06-23 DIAGNOSIS — N400 Enlarged prostate without lower urinary tract symptoms: Secondary | ICD-10-CM

## 2013-06-23 DIAGNOSIS — Z0000 Encounter for general adult medical examination without abnormal findings: Secondary | ICD-10-CM

## 2013-06-23 DIAGNOSIS — I10 Essential (primary) hypertension: Secondary | ICD-10-CM

## 2013-06-23 DIAGNOSIS — N1832 Chronic kidney disease, stage 3b: Secondary | ICD-10-CM | POA: Insufficient documentation

## 2013-06-23 NOTE — Assessment & Plan Note (Signed)
Preventative protocols reviewed and updated unless pt declined. Discussed healthy diet and lifestyle.  

## 2013-06-23 NOTE — Addendum Note (Signed)
Addended by: Lurlean Nanny on: 06/23/2013 09:33 AM   Modules accepted: Orders

## 2013-06-23 NOTE — Assessment & Plan Note (Signed)
I have personally reviewed the Medicare Annual Wellness questionnaire and have noted 1. The patient's medical and social history 2. Their use of alcohol, tobacco or illicit drugs 3. Their current medications and supplements 4. The patient's functional ability including ADL's, fall risks, home safety risks and hearing or visual impairment. 5. Diet and physical activity 6. Evidence for depression or mood disorders The patients weight, height, BMI have been recorded in the chart.  Hearing and vision has been addressed. I have made referrals, counseling and provided education to the patient based review of the above and I have provided the pt with a written personalized care plan for preventive services. See scanned questionairre. Advanced directives discussed: I asked pt to bring me copy of living will. Would want son tobe HCPOA.  Reviewed preventative protocols and updated unless pt declined. To stop screening for prostate cancer. Known BPH but no sxs.

## 2013-06-23 NOTE — Progress Notes (Signed)
BP 126/78  Pulse 62  Temp(Src) 97.7 F (36.5 C) (Oral)  Ht 5' 9.5" (1.765 m)  Wt 201 lb (91.173 kg)  BMI 29.27 kg/m2  SpO2 96%   CC: medicare wellness visit  Subjective:    Patient ID: Shawn Andrews, male    DOB: 26-Jun-1928, 80 y.o.   MRN: [id number]  HPI: Shawn Andrews is a 80 y.o. male presenting on 06/23/2013 for Annual Exam   Vision screen deferred - sees eye doctor regularly. Hearing screen passed as well - uses amplifier which helps.  No falls in last year.  Denies depression/anhedonia, sadness.   Preventative: Prostate screening - BPH - has had elevated PSA in past but nomal biopsies. On cardura and finasteride. Denies voiding difficulty Td 2003 - declines. Knows to come in if any cuts. Pneumovax 2012. prevnar today. zostavax - discussed. Declines. H/o shingles in past.  Flu shot 09/2012 Advanced directives: has living will at home. Son is HCPOA Therapist, occupational [employer]). Doesn't want prolonged life support  Wt Readings from Last 3 Encounters:  06/23/13 201 lb (91.173 kg)  12/22/12 204 lb 6.4 oz (92.715 kg)  11/16/12 203 lb (92.08 kg)   Body mass index is 29.27 kg/(m^2).  Widowed, remarried 07/07/2007. Daughter deceased from metastatic melanoma  Lives at [facility] Activity: walks some Diet: good water, fruits/vegetables daily  Relevant past medical, surgical, family and social history reviewed and updated as indicated.  Allergies and medications reviewed and updated. Current Outpatient Prescriptions on File Prior to Visit  Medication Sig  . ALPRAZolam (XANAX) 0.25 MG tablet Take one tablet by mouth 3 times a day as needed for tremor control.  Marland Kitchen amLODipine (NORVASC) 10 MG tablet Take 1 tablet (10 mg total) by mouth daily.  Marland Kitchen aspirin 81 MG EC tablet Take 81 mg by mouth 2 (two) times a week.   . Cyanocobalamin (VITAMIN B12 PO) Take by mouth daily.    Marland Kitchen doxazosin (CARDURA) 4 MG tablet Take 1 tablet (4 mg total) by mouth daily.  . finasteride (PROSCAR) 5 MG  tablet Take 1 tablet (5 mg total) by mouth daily.  Marland Kitchen FLAXSEED, LINSEED, PO Take 1 tablet by mouth 2 (two) times daily.   Marland Kitchen gabapentin (NEURONTIN) 100 MG capsule Take 1 capsule (100 mg total) by mouth 2 (two) times daily.  Marland Kitchen lisinopril (PRINIVIL,ZESTRIL) 5 MG tablet Take 1 tablet (5 mg total) by mouth daily. Take one tablet by mouth in the morning  . loratadine (CLARITIN) 10 MG tablet Take 1 tablet (10 mg total) by mouth daily as needed for allergies.  . metoprolol (LOPRESSOR) 50 MG tablet Take 1 tablet (50 mg total) by mouth 2 (two) times daily.  . sildenafil (VIAGRA) 50 MG tablet Take 0.5-1 tablets (25-50 mg total) by mouth daily as needed for erectile dysfunction. Don't take within 4 hours of cardura   No current facility-administered medications on file prior to visit.    Review of Systems  Constitutional: Negative for fever, chills, activity change, appetite change, fatigue and unexpected weight change.  HENT: Negative for hearing loss.   Eyes: Negative for visual disturbance.  Respiratory: Negative for cough, chest tightness, shortness of breath and wheezing.   Cardiovascular: Negative for chest pain, palpitations and leg swelling.  Gastrointestinal: Negative for nausea, vomiting, abdominal pain, diarrhea, constipation, blood in stool and abdominal distention.  Genitourinary: Negative for hematuria and difficulty urinating.  Musculoskeletal: Negative for arthralgias, myalgias and neck pain.  Skin: Negative for rash.  Neurological: Negative for dizziness,  seizures, syncope and headaches.  Hematological: Negative for adenopathy. Bruises/bleeds easily.  Psychiatric/Behavioral: Negative for dysphoric mood. The patient is not nervous/anxious.    Per HPI unless specifically indicated above    Objective:    BP 126/78  Pulse 62  Temp(Src) 97.7 F (36.5 C) (Oral)  Ht 5' 9.5" (1.765 m)  Wt 201 lb (91.173 kg)  BMI 29.27 kg/m2  SpO2 96%  Physical Exam  Nursing note and vitals  reviewed. Constitutional: He is oriented to person, place, and time. He appears well-developed and well-nourished. No distress.  HENT:  Head: Normocephalic and atraumatic.  Right Ear: Hearing, tympanic membrane, external ear and ear canal normal.  Left Ear: Hearing, tympanic membrane, external ear and ear canal normal.  Nose: Nose normal.  Mouth/Throat: Uvula is midline, oropharynx is clear and moist and mucous membranes are normal. No oropharyngeal exudate, posterior oropharyngeal edema or posterior oropharyngeal erythema.  Eyes: Conjunctivae and EOM are normal. Pupils are equal, round, and reactive to light. No scleral icterus.  Neck: Normal range of motion. Neck supple. Carotid bruit is not present. No thyromegaly present.  Cardiovascular: Normal rate, regular rhythm, normal heart sounds and intact distal pulses.   No murmur heard. Pulses:      Radial pulses are 2+ on the right side, and 2+ on the left side.  Pulmonary/Chest: Effort normal and breath sounds normal. No respiratory distress. He has no wheezes. He has no rales.  Abdominal: Soft. Bowel sounds are normal. He exhibits no distension and no mass. There is no tenderness. There is no rebound and no guarding.  Musculoskeletal: Normal range of motion. He exhibits no edema.  Lymphadenopathy:    He has no cervical adenopathy.  Neurological: He is alert and oriented to person, place, and time.  CN grossly intact, station and gait intact Recall 3/3 Calculation 4/5 serial 3s  Skin: Skin is warm and dry. No rash noted.  Psychiatric: He has a normal mood and affect. His behavior is normal. Judgment and thought content normal.   Results for orders placed in visit on 06/18/13  PSA      Result Value Ref Range   PSA 4.14 (*) 0.10 - 4.00 ng/mL  LIPID PANEL      Result Value Ref Range   Cholesterol 202 (*) 0 - 200 mg/dL   Triglycerides 75.0  0.0 - 149.0 mg/dL   HDL 43.60  >39.00 mg/dL   VLDL 15.0  0.0 - 40.0 mg/dL   LDL Cholesterol 143  (*) 0 - 99 mg/dL   Total CHOL/HDL Ratio 5    BASIC METABOLIC PANEL      Result Value Ref Range   Sodium 140  135 - 145 mEq/L   Potassium 4.3  3.5 - 5.1 mEq/L   Chloride 105  96 - 112 mEq/L   CO2 30  19 - 32 mEq/L   Glucose, Bld 92  70 - 99 mg/dL   BUN 32 (*) 6 - 23 mg/dL   Creatinine, Ser 1.5  0.4 - 1.5 mg/dL   Calcium 9.4  8.4 - 10.5 mg/dL   GFR 47.13 (*) >60.00 mL/min  TSH      Result Value Ref Range   TSH 2.18  0.35 - 4.50 uIU/mL      Assessment & Plan:   Problem List Items Addressed This Visit   VITAMIN D DEFICIENCY   Medicare annual wellness visit, subsequent - Primary     I have personally reviewed the Medicare Annual Wellness questionnaire and have noted  1. The patient's medical and social history 2. Their use of alcohol, tobacco or illicit drugs 3. Their current medications and supplements 4. The patient's functional ability including ADL's, fall risks, home safety risks and hearing or visual impairment. 5. Diet and physical activity 6. Evidence for depression or mood disorders The patients weight, height, BMI have been recorded in the chart.  Hearing and vision has been addressed. I have made referrals, counseling and provided education to the patient based review of the above and I have provided the pt with a written personalized care plan for preventive services. See scanned questionairre. Advanced directives discussed: I asked pt to bring me copy of living will. Would want son tobe HCPOA.  Reviewed preventative protocols and updated unless pt declined. To stop screening for prostate cancer. Known BPH but no sxs.    HYPERTENSION     Chronic, stable . Continue 3 meds.    HYPERLIPIDEMIA     Chronic, LDL elevated. rec continued flax seed oil. Continue to monitor.    Health maintenance examination     Preventative protocols reviewed and updated unless pt declined. Discussed healthy diet and lifestyle.    ELEVATED PROSTATE SPECIFIC ANTIGEN     S/p mult normal  biopsies -attributed to BPH.    CKD (chronic kidney disease) stage 3, GFR 30-59 ml/min     Relatively new dx although has been ongoing for last several years. Discussed importance of good hydration status as well as avoiding NSAIDs and other nephrotoxic agents.    BENIGN PROSTATIC HYPERTROPHY     Stable on alpha blocker and 5a reductase inhibitor.        Follow up plan: Return in about 1 year (around 06/24/2014), or as needed, for annual exam, prior fasting for blood work.

## 2013-06-23 NOTE — Assessment & Plan Note (Signed)
S/p mult normal biopsies -attributed to BPH.

## 2013-06-23 NOTE — Progress Notes (Signed)
Pre visit review using our clinic review tool, if applicable. No additional management support is needed unless otherwise documented below in the visit note. 

## 2013-06-23 NOTE — Assessment & Plan Note (Signed)
Chronic, LDL elevated. rec continued flax seed oil. Continue to monitor.

## 2013-06-23 NOTE — Patient Instructions (Addendum)
Prevnar today Bring me a copy of living will/advanced directive to update your chart. Bring back medicare wellness form you filled out at home. Try aspirin 2-3 times a week. For kidneys - ensure staying well hydrated, avoid NSAIDs like aleve, advil, ibuprofen and motrin. Tylenol is ok for pain Good to see you today, call us with questions.

## 2013-06-23 NOTE — Assessment & Plan Note (Signed)
Chronic, stable . Continue 3 meds.

## 2013-06-23 NOTE — Assessment & Plan Note (Signed)
Relatively new dx although has been ongoing for last several years. Discussed importance of good hydration status as well as avoiding NSAIDs and other nephrotoxic agents.

## 2013-06-23 NOTE — Assessment & Plan Note (Signed)
Stable on alpha blocker and 5a reductase inhibitor.

## 2013-06-24 ENCOUNTER — Encounter: Payer: Self-pay | Admitting: Family Medicine

## 2013-06-24 ENCOUNTER — Other Ambulatory Visit: Payer: Self-pay | Admitting: *Deleted

## 2013-06-24 MED ORDER — ALPRAZOLAM 0.25 MG PO TABS: ORAL_TABLET | ORAL | Status: DC

## 2013-06-24 NOTE — Telephone Encounter (Signed)
#  270 x 0 last written on 12/22/12, and last ov was a cpx on 06/23/13.

## 2013-06-24 NOTE — Telephone Encounter (Signed)
plz phone in. 

## 2013-06-25 NOTE — Telephone Encounter (Signed)
Rx called in as directed.   

## 2013-10-25 ENCOUNTER — Other Ambulatory Visit: Payer: Self-pay | Admitting: Family Medicine

## 2013-11-01 ENCOUNTER — Other Ambulatory Visit: Payer: Self-pay

## 2013-11-01 MED ORDER — ALPRAZOLAM 0.25 MG PO TABS: ORAL_TABLET | ORAL | Status: DC

## 2013-11-01 NOTE — Telephone Encounter (Signed)
plz fax. Hardcopy printed and placed in Kim's box.

## 2013-11-01 NOTE — Telephone Encounter (Signed)
Shawn Andrews called for status of refills requested on 10/25/13; advised done on 10/25/13 and Shawn Chuba will ck with [facility].

## 2013-11-01 NOTE — Telephone Encounter (Signed)
Rx faxed as requested.

## 2013-11-01 NOTE — Telephone Encounter (Signed)
Shawn Andrews left v/m requesting refill alprazolam to [facility]. Please advise.

## 2013-11-02 NOTE — Telephone Encounter (Signed)
Mrs Defino request status of alprazolam refill; advised faxed to [facility] on 11/01/13. Mrs Fredricksen will ck with pharmacy.

## 2014-04-09 ENCOUNTER — Other Ambulatory Visit: Payer: Self-pay | Admitting: Family Medicine

## 2014-04-21 ENCOUNTER — Other Ambulatory Visit: Payer: Self-pay

## 2014-04-21 NOTE — Telephone Encounter (Signed)
Printed and in Kim's box 

## 2014-04-21 NOTE — Telephone Encounter (Signed)
Pt left note requesting refill alprazolam to [facility]. Please advise. Call pt when done. Seen annual exam 06/23/2013.

## 2014-04-22 MED ORDER — ALPRAZOLAM 0.25 MG PO TABS: ORAL_TABLET | ORAL | Status: DC

## 2014-04-22 NOTE — Telephone Encounter (Signed)
Rx faxed to [facility] and patient notified.

## 2014-06-13 DIAGNOSIS — M4310 Spondylolisthesis, site unspecified: Secondary | ICD-10-CM | POA: Diagnosis not present

## 2014-06-13 DIAGNOSIS — M4726 Other spondylosis with radiculopathy, lumbar region: Secondary | ICD-10-CM | POA: Diagnosis not present

## 2014-06-15 ENCOUNTER — Other Ambulatory Visit: Payer: Self-pay | Admitting: Orthopedic Surgery

## 2014-06-15 DIAGNOSIS — M4310 Spondylolisthesis, site unspecified: Secondary | ICD-10-CM

## 2014-06-15 DIAGNOSIS — M4726 Other spondylosis with radiculopathy, lumbar region: Secondary | ICD-10-CM

## 2014-06-22 ENCOUNTER — Other Ambulatory Visit: Payer: Medicare Other

## 2014-06-22 ENCOUNTER — Other Ambulatory Visit: Payer: Self-pay | Admitting: Family Medicine

## 2014-06-22 DIAGNOSIS — I10 Essential (primary) hypertension: Secondary | ICD-10-CM

## 2014-06-22 DIAGNOSIS — N183 Chronic kidney disease, stage 3 unspecified: Secondary | ICD-10-CM

## 2014-06-22 DIAGNOSIS — E785 Hyperlipidemia, unspecified: Secondary | ICD-10-CM

## 2014-06-22 DIAGNOSIS — N400 Enlarged prostate without lower urinary tract symptoms: Secondary | ICD-10-CM

## 2014-06-22 DIAGNOSIS — R972 Elevated prostate specific antigen [PSA]: Secondary | ICD-10-CM

## 2014-06-22 DIAGNOSIS — R892 Abnormal level of other drugs, medicaments and biological substances in specimens from other organs, systems and tissues: Secondary | ICD-10-CM

## 2014-06-22 HISTORY — DX: Abnormal level of other drugs, medicaments and biological substances in specimens from other organs, systems and tissues: R89.2

## 2014-06-24 ENCOUNTER — Ambulatory Visit
Admission: RE | Admit: 2014-06-24 | Discharge: 2014-06-24 | Disposition: A | Discharge status: Home / Self Care | Payer: Medicare Other | Source: Ambulatory Visit | Attending: Orthopedic Surgery | Admitting: Orthopedic Surgery

## 2014-06-24 ENCOUNTER — Ambulatory Visit: Payer: Self-pay

## 2014-06-24 DIAGNOSIS — M4310 Spondylolisthesis, site unspecified: Secondary | ICD-10-CM

## 2014-06-24 DIAGNOSIS — M4726 Other spondylosis with radiculopathy, lumbar region: Secondary | ICD-10-CM | POA: Diagnosis not present

## 2014-06-24 DIAGNOSIS — M5126 Other intervertebral disc displacement, lumbar region: Secondary | ICD-10-CM | POA: Diagnosis not present

## 2014-06-29 ENCOUNTER — Encounter: Payer: Medicare Other | Admitting: Family Medicine

## 2014-07-05 ENCOUNTER — Encounter: Payer: Self-pay | Admitting: Family Medicine

## 2014-07-05 ENCOUNTER — Ambulatory Visit (INDEPENDENT_AMBULATORY_CARE_PROVIDER_SITE_OTHER): Payer: Medicare Other | Admitting: Family Medicine

## 2014-07-05 ENCOUNTER — Encounter: Payer: Self-pay | Admitting: *Deleted

## 2014-07-05 VITALS — BP 126/72 | HR 68 | Temp 97.5°F | Ht 69.5 in | Wt 205.2 lb

## 2014-07-05 DIAGNOSIS — E559 Vitamin D deficiency, unspecified: Secondary | ICD-10-CM | POA: Diagnosis not present

## 2014-07-05 DIAGNOSIS — N183 Chronic kidney disease, stage 3 unspecified: Secondary | ICD-10-CM

## 2014-07-05 DIAGNOSIS — N400 Enlarged prostate without lower urinary tract symptoms: Secondary | ICD-10-CM

## 2014-07-05 DIAGNOSIS — R413 Other amnesia: Secondary | ICD-10-CM

## 2014-07-05 DIAGNOSIS — I10 Essential (primary) hypertension: Secondary | ICD-10-CM

## 2014-07-05 DIAGNOSIS — Z7189 Other specified counseling: Secondary | ICD-10-CM | POA: Insufficient documentation

## 2014-07-05 DIAGNOSIS — G250 Essential tremor: Secondary | ICD-10-CM | POA: Diagnosis not present

## 2014-07-05 DIAGNOSIS — M5417 Radiculopathy, lumbosacral region: Secondary | ICD-10-CM

## 2014-07-05 DIAGNOSIS — R972 Elevated prostate specific antigen [PSA]: Secondary | ICD-10-CM

## 2014-07-05 DIAGNOSIS — N529 Male erectile dysfunction, unspecified: Secondary | ICD-10-CM

## 2014-07-05 DIAGNOSIS — Z0000 Encounter for general adult medical examination without abnormal findings: Secondary | ICD-10-CM

## 2014-07-05 DIAGNOSIS — E785 Hyperlipidemia, unspecified: Secondary | ICD-10-CM | POA: Diagnosis not present

## 2014-07-05 DIAGNOSIS — Z79899 Other long term (current) drug therapy: Secondary | ICD-10-CM | POA: Diagnosis not present

## 2014-07-05 DIAGNOSIS — H9193 Unspecified hearing loss, bilateral: Secondary | ICD-10-CM

## 2014-07-05 DIAGNOSIS — N528 Other male erectile dysfunction: Secondary | ICD-10-CM

## 2014-07-05 LAB — LIPID PANEL
CHOL/HDL RATIO: 4
Cholesterol: 213 mg/dL — ABNORMAL HIGH (ref 0–200)
HDL: 49.5 mg/dL (ref >39.00)
LDL Cholesterol: 145 mg/dL — ABNORMAL HIGH (ref 0–99)
NonHDL: 163.5
Triglycerides: 94 mg/dL (ref 0.0–149.0)
VLDL: 18.8 mg/dL (ref 0.0–40.0)

## 2014-07-05 LAB — VITAMIN B12: VITAMIN B 12: 1129 pg/mL — AB (ref 211–911)

## 2014-07-05 LAB — CBC WITH DIFFERENTIAL/PLATELET
BASOS PCT: 1.3 % (ref 0.0–3.0)
Basophils Absolute: 0.1 10*3/uL (ref 0.0–0.1)
EOS PCT: 5 % (ref 0.0–5.0)
Eosinophils Absolute: 0.3 10*3/uL (ref 0.0–0.7)
HEMATOCRIT: 40.3 % (ref 39.0–52.0)
HEMOGLOBIN: 13.6 g/dL (ref 13.0–17.0)
Lymphocytes Relative: 38 % (ref 12.0–46.0)
Lymphs Abs: 2.6 10*3/uL (ref 0.7–4.0)
MCHC: 33.8 g/dL (ref 30.0–36.0)
MCV: 86 fl (ref 78.0–100.0)
Monocytes Absolute: 0.4 10*3/uL (ref 0.1–1.0)
Monocytes Relative: 6.1 % (ref 3.0–12.0)
Neutro Abs: 3.4 10*3/uL (ref 1.4–7.7)
Neutrophils Relative %: 49.6 % (ref 43.0–77.0)
Platelets: 197 10*3/uL (ref 150.0–400.0)
RBC: 4.68 Mil/uL (ref 4.22–5.81)
RDW: 13.7 % (ref 11.5–15.5)
WBC: 6.8 10*3/uL (ref 4.0–10.5)

## 2014-07-05 LAB — RENAL FUNCTION PANEL
Albumin: 4.3 g/dL (ref 3.5–5.2)
BUN: 28 mg/dL — ABNORMAL HIGH (ref 6–23)
CO2: 28 mEq/L (ref 19–32)
CREATININE: 1.37 mg/dL (ref 0.40–1.50)
Calcium: 9.5 mg/dL (ref 8.4–10.5)
Chloride: 105 mEq/L (ref 96–112)
GFR: 52.6 mL/min — ABNORMAL LOW (ref >60.00)
GLUCOSE: 104 mg/dL — AB (ref 70–99)
POTASSIUM: 5.5 meq/L — AB (ref 3.5–5.1)
Phosphorus: 3.3 mg/dL (ref 2.3–4.6)
SODIUM: 138 meq/L (ref 135–145)

## 2014-07-05 LAB — TSH: TSH: 3.29 u[IU]/mL (ref 0.35–4.50)

## 2014-07-05 LAB — VITAMIN D 25 HYDROXY (VIT D DEFICIENCY, FRACTURES): VITD: 32.53 ng/mL (ref 30.00–100.00)

## 2014-07-05 NOTE — Assessment & Plan Note (Addendum)
Continue flax seed, check FLP today.

## 2014-07-05 NOTE — Assessment & Plan Note (Signed)
Chronic, stable. Continue current regimen. 

## 2014-07-05 NOTE — Assessment & Plan Note (Signed)
Gabapentin started by Dr Marry Guan.

## 2014-07-05 NOTE — Assessment & Plan Note (Signed)

## 2014-07-05 NOTE — Assessment & Plan Note (Signed)
Pt declines hearing aides. Has used amplifiers in the past.

## 2014-07-05 NOTE — Assessment & Plan Note (Signed)
Stable, continue proscar and cardura.

## 2014-07-05 NOTE — Assessment & Plan Note (Signed)
Recheck today. 

## 2014-07-05 NOTE — Assessment & Plan Note (Addendum)
BPH related. S/p 2 normal biopsies. Will stop checking

## 2014-07-05 NOTE — Progress Notes (Signed)
Pre visit review using our clinic review tool, if applicable. No additional management support is needed unless otherwise documented below in the visit note. 

## 2014-07-05 NOTE — Patient Instructions (Addendum)
labwork today. UDS today (controlled substance). Continue keeping mind active and continue B12 vitamin.  For tremor - let's give gabapentin a try - should help this as well as left leg pain. Continue xanax for now. If gabapentin not helping tremor, we could refer you to neurologist for a check. Good to see you today, call us with questions. Return as needed or in 1 year for next medicare wellness visit.

## 2014-07-05 NOTE — Assessment & Plan Note (Addendum)
Chronic issue.  longterm on xanax for this - helps to "decrease social embarassment". Prior gabapentin not tolerated, now tolerating 300mg  bid for L sciatica. Hopeful this will continue to improve with time - just started gabapentin. Requests continued xanax and will undergo yearly UDS today - if desired will contact me for referral to neuro for further eval/treatment of tremor.

## 2014-07-05 NOTE — Assessment & Plan Note (Signed)
Some trouble with calculation noted today - will continue to monitor. Check TSH, B12 today. Encouraged continued B12.

## 2014-07-05 NOTE — Assessment & Plan Note (Addendum)
Advanced directives: scanned 07/2013 and in chart. Son is HCPOA (Dean Desha). Doesn't want prolonged life support. 

## 2014-07-05 NOTE — Assessment & Plan Note (Signed)
Check renal panel today along with CBC and vit D.

## 2014-07-05 NOTE — Progress Notes (Signed)
BP 126/72 mmHg  Pulse 68  Temp(Src) 97.5 F (36.4 C) (Oral)  Ht 5' 9.5" (1.765 m)  Wt 205 lb 4 oz (93.101 kg)  BMI 29.89 kg/m2   CC: medicare wellness visit  Subjective:    Patient ID: Shawn Andrews, male    DOB: 03/07/1928, 81 y.o.   MRN: [id number]  HPI: Shawn Andrews is a 81 y.o. male presenting on 07/05/2014 for Annual Exam   Chronic back pain followed by ortho with recent MRI showing mid DDD and foraminal narrowing left L5. Followed by Dr Marry Guan. "L sciatic nerve" considering steroid injections. Just started on gabapentin.   Essential tremor worsening - to point of unable to sign name. longterm on xanax.   Vision screen deferred - sees eye doctor regularly. Hearing screen passed - uses amplifier which helps.  No falls in last year.  Denies depression/anhedonia, sadness.   Preventative: Prostate screening - BPH - has had elevated PSA in past but nomal biopsies. On cardura and finasteride. Denies voiding difficulty. Last year we decided to stop PSA/DRE checks. Colon cancer screening - aged out Flu shot yearly Td 2003 - declines. Knows to come in if any cuts. Pneumovax 2012. Prevnar 2015 zostavax - discussed. Declines. H/o shingles in past.  Advanced directives: scanned 07/2013 and in chart. Son is HCPOA Therapist, occupational [employer]). Doesn't want prolonged life support.  Seat belt use discussed Sees derm regularly.   Widowed, remarried 07/26/2007. Daughter deceased from metastatic melanoma  L[facility] at [facility] Activity: walks 1.5 miles 3d/wk Diet: good water, fruits/vegetables daily  Relevant past medical, surgical, family and social history reviewed and updated as indicated. Interim medical history since our last visit reviewed. Allergies and medications reviewed and updated. Current Outpatient Prescriptions on File Prior to Visit  Medication Sig  . ALPRAZolam (XANAX) 0.25 MG tablet Take one tablet by mouth 3 times a day as needed for tremor control.  Marland Kitchen  amLODipine (NORVASC) 10 MG tablet Take 1 tablet by mouth  daily  . aspirin 81 MG EC tablet Take 81 mg by mouth 2 (two) times a week.   . Cyanocobalamin (VITAMIN B12 PO) Take by mouth daily.    Marland Kitchen doxazosin (CARDURA) 4 MG tablet Take 1 tablet by mouth  daily  . finasteride (PROSCAR) 5 MG tablet Take 1 tablet by mouth  daily  . FLAXSEED, LINSEED, PO Take 1 tablet by mouth 2 (two) times daily.   Marland Kitchen gabapentin (NEURONTIN) 100 MG capsule Take 1 capsule (100 mg total) by mouth 2 (two) times daily.  Marland Kitchen lisinopril (PRINIVIL,ZESTRIL) 5 MG tablet Take 1 tablet by mouth  daily in the morning  . loratadine (CLARITIN) 10 MG tablet Take 1 tablet (10 mg total) by mouth daily as needed for allergies.  . metoprolol (LOPRESSOR) 50 MG tablet Take 1 tablet by mouth two  times daily  . sildenafil (VIAGRA) 50 MG tablet Take 0.5-1 tablets (25-50 mg total) by mouth daily as needed for erectile dysfunction. Don't take within 4 hours of cardura (Patient not taking: Reported on 07/05/2014)   No current facility-administered medications on file prior to visit.    Review of Systems Per HPI unless specifically indicated above     Objective:    BP 126/72 mmHg  Pulse 68  Temp(Src) 97.5 F (36.4 C) (Oral)  Ht 5' 9.5" (1.765 m)  Wt 205 lb 4 oz (93.101 kg)  BMI 29.89 kg/m2  Wt Readings from Last 3 Encounters:  07/05/14 205 lb 4 oz (93.101 kg)  06/24/14 201 lb (91.173 kg)  06/23/13 201 lb (91.173 kg)    Physical Exam  Constitutional: He is oriented to person, place, and time. He appears well-developed and well-nourished. No distress.  HENT:  Head: Normocephalic and atraumatic.  Right Ear: Hearing, tympanic membrane, external ear and ear canal normal.  Left Ear: Hearing, tympanic membrane, external ear and ear canal normal.  Nose: Nose normal.  Mouth/Throat: Uvula is midline, oropharynx is clear and moist and mucous membranes are normal. No oropharyngeal exudate, posterior oropharyngeal edema or posterior  oropharyngeal erythema.  Eyes: Conjunctivae and EOM are normal. Pupils are equal, round, and reactive to light. No scleral icterus.  Neck: Normal range of motion. Neck supple. Carotid bruit is not present. No thyromegaly present.  Cardiovascular: Normal rate, regular rhythm, normal heart sounds and intact distal pulses.   No murmur heard. Pulses:      Radial pulses are 2+ on the right side, and 2+ on the left side.  Pulmonary/Chest: Effort normal and breath sounds normal. No respiratory distress. He has no wheezes. He has no rales.  Abdominal: Soft. Bowel sounds are normal. He exhibits no distension and no mass. There is no tenderness. There is no rebound and no guarding.  Musculoskeletal: Normal range of motion. He exhibits no edema.  Lymphadenopathy:    He has no cervical adenopathy.  Neurological: He is alert and oriented to person, place, and time.  CN grossly intact, station and gait intact Recall 3/3  Calculation trouble with serial 7s, serial 3s, 4/5 [id number]  Skin: Skin is warm and dry. No rash noted.  Psychiatric: He has a normal mood and affect. His behavior is normal. Judgment and thought content normal.  Nursing note and vitals reviewed.     Assessment & Plan:   Problem List Items Addressed This Visit    Advanced care planning/counseling discussion    Advanced directives: scanned 07/2013 and in chart. Son is HCPOA Therapist, occupational [employer]). Doesn't want prolonged life support.       BPH (benign prostatic hypertrophy)    Stable, continue proscar and cardura.      CKD (chronic kidney disease) stage 3, GFR 30-59 ml/min    Check renal panel today along with CBC and vit D.      Relevant Orders   Renal function panel   CBC with Differential/Platelet   Vit D  25 hydroxy (rtn osteoporosis monitoring)   Parathyroid hormone, intact (no Ca)   ELEVATED PROSTATE SPECIFIC ANTIGEN    BPH related. S/p 2 normal biopsies. Will stop checking      Essential hypertension    Chronic,  stable. Continue current regimen.      Relevant Orders   TSH   Essential tremor    Chronic issue.  longterm on xanax for this - helps to "decrease social embarassment". Prior gabapentin not tolerated, now tolerating 300mg  bid for L sciatica. Hopeful this will continue to improve with time - just started gabapentin. Requests continued xanax and will undergo yearly UDS today - if desired will contact me for referral to neuro for further eval/treatment of tremor.      High frequency hearing loss    Pt declines hearing aides. Has used amplifiers in the past.      HLD (hyperlipidemia)    Continue flax seed, check FLP today.      Relevant Orders   Lipid panel   Lumbosacral radiculopathy at L5    Gabapentin started by Dr Marry Guan.      Medicare annual wellness  visit, subsequent - Primary    I have personally reviewed the Medicare Annual Wellness questionnaire and have noted 1. The patient's medical and social history 2. Their use of alcohol, tobacco or illicit drugs 3. Their current medications and supplements 4. The patient's functional ability including ADL's, fall risks, home safety risks and hearing or visual impairment. 5. Diet and physical activity 6. Evidence for depression or mood disorders The patients weight, height, BMI have been recorded in the chart.  Hearing and vision has been addressed. I have made referrals, counseling and provided education to the patient based review of the above and I have provided the pt with a written personalized care plan for preventive services. Provider list updated - see scanned questionairre. Reviewed preventative protocols and updated unless pt declined.       Memory deficit    Some trouble with calculation noted today - will continue to monitor. Check TSH, B12 today. Encouraged continued B12.      Relevant Orders   TSH   Vitamin B12   ORGANIC IMPOTENCE    Failed cialis, viagra.      Vitamin D deficiency    Recheck today.       Relevant Orders   Vit D  25 hydroxy (rtn osteoporosis monitoring)       Follow up plan: Return in about 1 year (around 07/05/2015), or as needed, for medicare wellness.

## 2014-07-05 NOTE — Assessment & Plan Note (Signed)
Failed cialis, viagra.

## 2014-07-06 LAB — PARATHYROID HORMONE, INTACT (NO CA): PTH: 86 pg/mL — ABNORMAL HIGH (ref 14–64)

## 2014-07-09 ENCOUNTER — Other Ambulatory Visit: Payer: Self-pay | Admitting: Family Medicine

## 2014-07-09 DIAGNOSIS — E875 Hyperkalemia: Secondary | ICD-10-CM

## 2014-07-12 ENCOUNTER — Encounter: Payer: Self-pay | Admitting: Family Medicine

## 2014-07-12 ENCOUNTER — Other Ambulatory Visit: Payer: Self-pay | Admitting: Family Medicine

## 2014-07-13 DIAGNOSIS — D1801 Hemangioma of skin and subcutaneous tissue: Secondary | ICD-10-CM | POA: Diagnosis not present

## 2014-07-13 DIAGNOSIS — L821 Other seborrheic keratosis: Secondary | ICD-10-CM | POA: Diagnosis not present

## 2014-07-13 DIAGNOSIS — L570 Actinic keratosis: Secondary | ICD-10-CM | POA: Diagnosis not present

## 2014-07-13 DIAGNOSIS — D225 Melanocytic nevi of trunk: Secondary | ICD-10-CM | POA: Diagnosis not present

## 2014-07-13 DIAGNOSIS — L720 Epidermal cyst: Secondary | ICD-10-CM | POA: Diagnosis not present

## 2014-07-20 ENCOUNTER — Encounter: Payer: Self-pay | Admitting: Family Medicine

## 2014-07-26 ENCOUNTER — Other Ambulatory Visit (INDEPENDENT_AMBULATORY_CARE_PROVIDER_SITE_OTHER): Payer: Medicare Other

## 2014-07-26 DIAGNOSIS — E875 Hyperkalemia: Secondary | ICD-10-CM | POA: Diagnosis not present

## 2014-07-26 LAB — BASIC METABOLIC PANEL
BUN: 28 mg/dL — ABNORMAL HIGH (ref 6–23)
CHLORIDE: 105 meq/L (ref 96–112)
CO2: 28 mEq/L (ref 19–32)
Calcium: 9.5 mg/dL (ref 8.4–10.5)
Creatinine, Ser: 1.55 mg/dL — ABNORMAL HIGH (ref 0.40–1.50)
GFR: 45.61 mL/min — ABNORMAL LOW (ref >60.00)
Glucose, Bld: 92 mg/dL (ref 70–99)
Potassium: 4.3 mEq/L (ref 3.5–5.1)
SODIUM: 141 meq/L (ref 135–145)

## 2014-07-27 ENCOUNTER — Encounter: Payer: Self-pay | Admitting: *Deleted

## 2014-08-18 ENCOUNTER — Other Ambulatory Visit: Payer: Self-pay | Admitting: Family Medicine

## 2014-08-23 ENCOUNTER — Other Ambulatory Visit: Payer: Self-pay | Admitting: *Deleted

## 2014-08-23 NOTE — Telephone Encounter (Signed)
Ok to refill to mail order? 

## 2014-08-24 ENCOUNTER — Telehealth: Payer: Self-pay

## 2014-08-24 MED ORDER — ALPRAZOLAM 0.25 MG PO TABS: ORAL_TABLET | ORAL | Status: DC

## 2014-08-24 NOTE — Telephone Encounter (Signed)
Pt request all meds refilled to [facility]. Advised pt appears meds have already been refilled this month. Pt will ck with pharmacy and anything further needed pt will call back.

## 2014-08-24 NOTE — Telephone Encounter (Signed)
Printed and in Kim's box 

## 2014-08-24 NOTE — Telephone Encounter (Signed)
Rx faxed to [facility]

## 2014-09-09 DIAGNOSIS — M4806 Spinal stenosis, lumbar region: Secondary | ICD-10-CM | POA: Diagnosis not present

## 2014-09-09 DIAGNOSIS — M5416 Radiculopathy, lumbar region: Secondary | ICD-10-CM | POA: Diagnosis not present

## 2014-09-09 DIAGNOSIS — M5136 Other intervertebral disc degeneration, lumbar region: Secondary | ICD-10-CM | POA: Diagnosis not present

## 2014-09-15 ENCOUNTER — Encounter: Payer: Self-pay | Admitting: Family Medicine

## 2014-10-05 DIAGNOSIS — M5416 Radiculopathy, lumbar region: Secondary | ICD-10-CM | POA: Diagnosis not present

## 2014-10-05 DIAGNOSIS — M5136 Other intervertebral disc degeneration, lumbar region: Secondary | ICD-10-CM | POA: Diagnosis not present

## 2014-10-05 DIAGNOSIS — M4806 Spinal stenosis, lumbar region: Secondary | ICD-10-CM | POA: Diagnosis not present

## 2014-10-06 DIAGNOSIS — Z961 Presence of intraocular lens: Secondary | ICD-10-CM | POA: Diagnosis not present

## 2014-11-02 DIAGNOSIS — M4316 Spondylolisthesis, lumbar region: Secondary | ICD-10-CM | POA: Diagnosis not present

## 2014-11-02 DIAGNOSIS — M4726 Other spondylosis with radiculopathy, lumbar region: Secondary | ICD-10-CM | POA: Diagnosis not present

## 2014-11-17 ENCOUNTER — Other Ambulatory Visit: Payer: Self-pay | Admitting: *Deleted

## 2014-11-17 MED ORDER — ALPRAZOLAM 0.25 MG PO TABS: ORAL_TABLET | ORAL | Status: DC

## 2014-11-17 NOTE — Telephone Encounter (Signed)
Ok to refill to mail order? Will require written Rx.

## 2014-11-17 NOTE — Telephone Encounter (Signed)
Printed and in Kim's box 

## 2014-11-18 NOTE — Telephone Encounter (Signed)
Rx faxed to [facility]

## 2014-11-25 ENCOUNTER — Other Ambulatory Visit: Payer: Self-pay | Admitting: *Deleted

## 2014-11-25 MED ORDER — FINASTERIDE 5 MG PO TABS: 5.0000 mg | ORAL_TABLET | Freq: Every day | ORAL | Status: DC

## 2014-11-25 MED ORDER — AMLODIPINE BESYLATE 10 MG PO TABS: ORAL_TABLET | ORAL | Status: DC

## 2014-11-25 MED ORDER — METOPROLOL TARTRATE 50 MG PO TABS: 50.0000 mg | ORAL_TABLET | Freq: Two times a day (BID) | ORAL | Status: DC

## 2014-11-25 MED ORDER — DOXAZOSIN MESYLATE 4 MG PO TABS: ORAL_TABLET | ORAL | Status: DC

## 2014-12-02 DIAGNOSIS — M47816 Spondylosis without myelopathy or radiculopathy, lumbar region: Secondary | ICD-10-CM | POA: Diagnosis not present

## 2014-12-08 ENCOUNTER — Encounter: Payer: Self-pay | Admitting: Family Medicine

## 2014-12-22 DIAGNOSIS — M4316 Spondylolisthesis, lumbar region: Secondary | ICD-10-CM | POA: Diagnosis not present

## 2014-12-22 DIAGNOSIS — M4726 Other spondylosis with radiculopathy, lumbar region: Secondary | ICD-10-CM | POA: Diagnosis not present

## 2015-04-21 ENCOUNTER — Other Ambulatory Visit: Payer: Self-pay | Admitting: *Deleted

## 2015-04-21 MED ORDER — ALPRAZOLAM 0.25 MG PO TABS: ORAL_TABLET | ORAL | Status: DC

## 2015-04-21 NOTE — Telephone Encounter (Signed)
Ok to refill? Will need written Rx for mail order. Thanks.

## 2015-04-21 NOTE — Telephone Encounter (Signed)
Printed and in Kim's box 

## 2015-04-24 NOTE — Telephone Encounter (Signed)
Rx faxed to [facility]

## 2015-07-09 ENCOUNTER — Other Ambulatory Visit: Payer: Self-pay | Admitting: Family Medicine

## 2015-07-09 DIAGNOSIS — N183 Chronic kidney disease, stage 3 unspecified: Secondary | ICD-10-CM

## 2015-07-09 DIAGNOSIS — R413 Other amnesia: Secondary | ICD-10-CM

## 2015-07-09 DIAGNOSIS — E785 Hyperlipidemia, unspecified: Secondary | ICD-10-CM

## 2015-07-09 DIAGNOSIS — E559 Vitamin D deficiency, unspecified: Secondary | ICD-10-CM

## 2015-07-10 ENCOUNTER — Encounter: Payer: Self-pay | Admitting: Radiology

## 2015-07-10 ENCOUNTER — Other Ambulatory Visit (INDEPENDENT_AMBULATORY_CARE_PROVIDER_SITE_OTHER): Payer: Medicare Other

## 2015-07-10 ENCOUNTER — Ambulatory Visit (INDEPENDENT_AMBULATORY_CARE_PROVIDER_SITE_OTHER): Payer: Medicare Other

## 2015-07-10 VITALS — BP 122/70 | HR 50 | Temp 97.8°F | Ht 70.0 in | Wt 208.2 lb

## 2015-07-10 DIAGNOSIS — E785 Hyperlipidemia, unspecified: Secondary | ICD-10-CM | POA: Diagnosis not present

## 2015-07-10 DIAGNOSIS — Z0000 Encounter for general adult medical examination without abnormal findings: Secondary | ICD-10-CM | POA: Diagnosis not present

## 2015-07-10 DIAGNOSIS — R413 Other amnesia: Secondary | ICD-10-CM

## 2015-07-10 DIAGNOSIS — N183 Chronic kidney disease, stage 3 unspecified: Secondary | ICD-10-CM

## 2015-07-10 DIAGNOSIS — E559 Vitamin D deficiency, unspecified: Secondary | ICD-10-CM

## 2015-07-10 DIAGNOSIS — Z79899 Other long term (current) drug therapy: Secondary | ICD-10-CM | POA: Diagnosis not present

## 2015-07-10 LAB — CBC WITH DIFFERENTIAL/PLATELET
BASOS ABS: 0 10*3/uL (ref 0.0–0.1)
Basophils Relative: 0.4 % (ref 0.0–3.0)
EOS ABS: 0.5 10*3/uL (ref 0.0–0.7)
Eosinophils Relative: 7.6 % — ABNORMAL HIGH (ref 0.0–5.0)
HCT: 38.7 % — ABNORMAL LOW (ref 39.0–52.0)
Hemoglobin: 13.3 g/dL (ref 13.0–17.0)
LYMPHS ABS: 2.2 10*3/uL (ref 0.7–4.0)
Lymphocytes Relative: 34.1 % (ref 12.0–46.0)
MCHC: 34.4 g/dL (ref 30.0–36.0)
MCV: 85.1 fl (ref 78.0–100.0)
MONO ABS: 0.5 10*3/uL (ref 0.1–1.0)
Monocytes Relative: 7.6 % (ref 3.0–12.0)
NEUTROS PCT: 50.3 % (ref 43.0–77.0)
Neutro Abs: 3.2 10*3/uL (ref 1.4–7.7)
Platelets: 182 10*3/uL (ref 150.0–400.0)
RBC: 4.55 Mil/uL (ref 4.22–5.81)
RDW: 13.4 % (ref 11.5–15.5)
WBC: 6.4 10*3/uL (ref 4.0–10.5)

## 2015-07-10 LAB — LIPID PANEL
CHOL/HDL RATIO: 4
Cholesterol: 202 mg/dL — ABNORMAL HIGH (ref 0–200)
HDL: 46.4 mg/dL (ref >39.00)
LDL Cholesterol: 141 mg/dL — ABNORMAL HIGH (ref 0–99)
NONHDL: 155.41
Triglycerides: 71 mg/dL (ref 0.0–149.0)
VLDL: 14.2 mg/dL (ref 0.0–40.0)

## 2015-07-10 LAB — TSH: TSH: 4.34 u[IU]/mL (ref 0.35–4.50)

## 2015-07-10 LAB — RENAL FUNCTION PANEL
ALBUMIN: 4 g/dL (ref 3.5–5.2)
BUN: 29 mg/dL — AB (ref 6–23)
CALCIUM: 9.2 mg/dL (ref 8.4–10.5)
CO2: 26 mEq/L (ref 19–32)
CREATININE: 1.51 mg/dL — AB (ref 0.40–1.50)
Chloride: 105 mEq/L (ref 96–112)
GFR: 46.9 mL/min — ABNORMAL LOW (ref >60.00)
GLUCOSE: 104 mg/dL — AB (ref 70–99)
Phosphorus: 3.3 mg/dL (ref 2.3–4.6)
Potassium: 4.4 mEq/L (ref 3.5–5.1)
SODIUM: 140 meq/L (ref 135–145)

## 2015-07-10 LAB — VITAMIN D 25 HYDROXY (VIT D DEFICIENCY, FRACTURES): VITD: 36.9 ng/mL (ref 30.00–100.00)

## 2015-07-10 LAB — VITAMIN B12: VITAMIN B 12: 1103 pg/mL — AB (ref 211–911)

## 2015-07-10 NOTE — Patient Instructions (Signed)
Shawn Andrews , Thank you for taking time to come for your Medicare Wellness Visit. I appreciate your ongoing commitment to your health goals. Please review the following plan we discussed and let me know if I can assist you in the future.   These are the goals we discussed: Goals    . Increase physical activity     Starting 07/10/2015, I will continue to exercise at gym for at least 60 min 4 days per week.        This is a list of the screening recommended for you and due dates:  Health Maintenance  Topic Date Due  . Tetanus Vaccine  07/09/2016*  . Shingles Vaccine  07/10/2023*  . Flu Shot  08/22/2015  . DTaP/Tdap/Td vaccine  Completed  . Pneumonia vaccines  Completed  *Topic was postponed. The date shown is not the original due date.    Preventive Care for Adults  A healthy lifestyle and preventive care can promote health and wellness. Preventive health guidelines for adults include the following key practices.  . A routine yearly physical is a good way to check with your health care provider about your health and preventive screening. It is a chance to share any concerns and updates on your health and to receive a thorough exam.  . Visit your dentist for a routine exam and preventive care every 6 months. Brush your teeth twice a day and floss once a day. Good oral hygiene prevents tooth decay and gum disease.  . The frequency of eye exams is based on your age, health, family medical history, use  of contact lenses, and other factors. Follow your health care provider's ecommendations for frequency of eye exams.  . Eat a healthy diet. Foods like vegetables, fruits, whole grains, low-fat dairy products, and lean protein foods contain the nutrients you need without too many calories. Decrease your intake of foods high in solid fats, added sugars, and salt. Eat the right amount of calories for you. Get information about a proper diet from your health care provider, if necessary.  .  Regular physical exercise is one of the most important things you can do for your health. Most adults should get at least 150 minutes of moderate-intensity exercise (any activity that increases your heart rate and causes you to sweat) each week. In addition, most adults need muscle-strengthening exercises on 2 or more days a week.  Silver Sneakers may be a benefit available to you. To determine eligibility, you may visit the website: www.silversneakers.com or contact program at 478-692-4079 Mon-Fri between 8AM-8PM.   . Maintain a healthy weight. The body mass index (BMI) is a screening tool to identify possible weight problems. It provides an estimate of body fat based on height and weight. Your health care provider can find your BMI and can help you achieve or maintain a healthy weight.   For adults 20 years and older: ? A BMI below 18.5 is considered underweight. ? A BMI of 18.5 to 24.9 is normal. ? A BMI of 25 to 29.9 is considered overweight. ? A BMI of 30 and above is considered obese.   . Maintain normal blood lipids and cholesterol levels by exercising and minimizing your intake of saturated fat. Eat a balanced diet with plenty of fruit and vegetables. Blood tests for lipids and cholesterol should begin at age 8 and be repeated every 5 years. If your lipid or cholesterol levels are high, you are over 50, or you are at high risk for  heart disease, you may need your cholesterol levels checked more frequently. Ongoing high lipid and cholesterol levels should be treated with medicines if diet and exercise are not working.  . If you smoke, find out from your health care provider how to quit. If you do not use tobacco, please do not start.  . If you choose to drink alcohol, please do not consume more than 2 drinks per day. One drink is considered to be 12 ounces (355 mL) of beer, 5 ounces (148 mL) of wine, or 1.5 ounces (44 mL) of liquor.  . If you are 35-66 years old, ask your health care  provider if you should take aspirin to prevent strokes.  . Use sunscreen. Apply sunscreen liberally and repeatedly throughout the day. You should seek shade when your shadow is shorter than you. Protect yourself by wearing long sleeves, pants, a wide-brimmed hat, and sunglasses year round, whenever you are outdoors.  . Once a month, do a whole body skin exam, using a mirror to look at the skin on your back. Tell your health care provider of new moles, moles that have irregular borders, moles that are larger than a pencil eraser, or moles that have changed in shape or color.

## 2015-07-10 NOTE — Progress Notes (Signed)
Pre visit review using our clinic review tool, if applicable. No additional management support is needed unless otherwise documented below in the visit note. 

## 2015-07-10 NOTE — Progress Notes (Signed)
Subjective:   Shawn Andrews is a 82 y.o. male who presents for Medicare Annual/Subsequent preventive examination.  Review of Systems:  N/A Cardiac Risk Factors include: advanced age (>54men, >33 women);male gender;hypertension;dyslipidemia     Objective:    Vitals: BP 122/70 mmHg  Pulse 50  Temp(Src) 97.8 F (36.6 C) (Oral)  Ht 5\' 10"  (1.778 m)  Wt 208 lb 4 oz (94.462 kg)  BMI 29.88 kg/m2  SpO2 97%  Body mass index is 29.88 kg/(m^2).  Tobacco History  Smoking status  . Former Smoker -- 0.00 packs/day for 0 years  . Types: Pipe  . Quit date: 12/26/2000  Smokeless tobacco  . Current User  . Types: Chew    Comment: quit over 30 years ago     Ready to quit: No Counseling given: No   Past Medical History  Diagnosis Date  . BPH (benign prostatic hypertrophy) 01/21/98    has had 3 biopsies in past ([facility]) decided to stop PSA/DRE  . Hypertension 05/22/03  . Hyperlipidemia 01/21/02  . Osteoarthritis 01/22/88    knees, lumbar spondylosis and listhesis  . Abnormal drug screen 06/2014    inapprop neg xanax rpt 3 mo (06/2014)  . Left lumbar radiculopathy    Past Surgical History  Procedure Laterality Date  . Knee arthroscopy  1990    right  . Finger surgery      right middle, partial traumatic amputation  . Knee surgery  04/2006    right partial knee replacement in [place] - rec ppx abx for any invasive procedure  . Cataract extraction  2013    bilateral  . Esi Left 08/2014, 11/2014    L S1, L L5/S1 transforaminal ESI; L4/5 L5/S1 zygapophysial injections ([facility])   Family History  Problem Relation Age of Onset  . Hypertension Mother   . Heart disease Brother 46    MI  . Diabetes Brother   . Stroke Neg Hx   . Cancer Daughter     melanoma   History  Sexual Activity  . Sexual Activity: No    Outpatient Encounter Prescriptions as of 07/10/2015  Medication Sig  . ALPRAZolam (XANAX) 0.25 MG tablet Take one tablet by mouth 3 times a day as needed for tremor control.   Marland Kitchen amLODipine (NORVASC) 10 MG tablet Take 1 tablet by mouth  daily  . Cyanocobalamin (VITAMIN B12 PO) Take by mouth daily.    Marland Kitchen doxazosin (CARDURA) 4 MG tablet Take 1 tablet by mouth  daily  . finasteride (PROSCAR) 5 MG tablet Take 1 tablet (5 mg total) by mouth daily.  Marland Kitchen FLAXSEED, LINSEED, PO Take 1 tablet by mouth 2 (two) times daily.   Marland Kitchen gabapentin (NEURONTIN) 300 MG capsule Take 1 capsule (300 mg total) by mouth 2 (two) times daily.  Marland Kitchen lisinopril (PRINIVIL,ZESTRIL) 5 MG tablet Take 1 tablet by mouth  daily in the morning  . loratadine (CLARITIN) 10 MG tablet Take 1 tablet (10 mg total) by mouth daily as needed for allergies.  . metoprolol (LOPRESSOR) 50 MG tablet Take 1 tablet (50 mg total) by mouth 2 (two) times daily.  . [DISCONTINUED] aspirin 81 MG EC tablet Take 81 mg by mouth 2 (two) times a week.   . [DISCONTINUED] sildenafil (VIAGRA) 50 MG tablet Take 0.5-1 tablets (25-50 mg total) by mouth daily as needed for erectile dysfunction. Don't take within 4 hours of cardura (Patient not taking: Reported on 07/05/2014)   No facility-administered encounter medications on file as of 07/10/2015.  Activities of Daily Living In your present state of health, do you have any difficulty performing the following activities: 07/10/2015  Hearing? Y  Vision? N  Difficulty concentrating or making decisions? Y  Walking or climbing stairs? N  Dressing or bathing? N  Doing errands, shopping? N  Preparing Food and eating ? N  Using the Toilet? N  In the past six months, have you accidently leaked urine? N  Do you have problems with loss of bowel control? N  Managing your Medications? N  Managing your Finances? N  Housekeeping or managing your Housekeeping? N    Patient Care Team: Ria Bush, MD as PCP - General (Family Medicine) Dereck Leep, MD as Consulting Physician (Orthopedic Surgery)   Assessment:     Hearing Screening   125Hz  250Hz  500Hz  1000Hz  2000Hz  4000Hz  8000Hz   Right  ear:   40 40 0 0   Left ear:   40 40 0 0   Vision Screening Comments: Last eye exam in June 2016   Exercise Activities and Dietary recommendations Current Exercise Habits: Structured exercise class, Type of exercise: strength training/weights;Other - see comments (stationary bike), Time (Minutes): 60, Frequency (Times/Week): 4, Weekly Exercise (Minutes/Week): 240, Intensity: Moderate, Exercise limited by: None identified  Goals    . Increase physical activity     Starting 07/10/2015, I will continue to exercise at gym for at least 60 min 4 days per week.       Fall Risk Fall Risk  07/10/2015 07/05/2014 06/23/2013 06/19/2012  Falls in the past year? No No No No   Depression Screen PHQ 2/9 Scores 07/10/2015 07/05/2014 06/23/2013 06/19/2012  PHQ - 2 Score 0 0 0 0    Cognitive Testing MMSE - Mini Mental State Exam 07/10/2015  Orientation to time 5  Orientation to Place 5  Registration 3  Attention/ Calculation 0  Recall 2  Recall-comments pt was unable to recall 1 of 3 words  Language- name 2 objects 0  Language- repeat 1  Language- follow 3 step command 2  Language- follow 3 step command-comments pt was unable to follow 1 of 3 step command  Language- read & follow direction 0  Write a sentence 0  Copy design 0  Total score 18   PLEASE NOTE: A Mini-Cog screen was completed. Maximum score is 20. A value of 0 denotes this part of Folstein MMSE was not completed or the patient failed this part of the Mini-Cog screening.   Mini-Cog Screening Orientation to Time - Max 5 pts Orientation to Place - Max 5 pts Registration - Max 3 pts Recall - Max 3 pts Language Repeat - Max 1 pts Language Follow 3 Step Command - Max 3 pts  Immunization History  Administered Date(s) Administered  . Influenza Split 12/27/2010, 11/14/2011  . Influenza Whole 12/22/2003, 11/02/2007, 11/15/2008  . Influenza-Unspecified 09/21/2012, 10/21/2013  . Pneumococcal Conjugate-13 06/23/2013  . Pneumococcal  Polysaccharide-23 12/27/2010  . Td 02/21/2001   Screening Tests Health Maintenance  Topic Date Due  . TETANUS/TDAP  07/09/2016 (Originally 02/22/2011)  . ZOSTAVAX  07/10/2023 (Originally 05/10/1990)  . INFLUENZA VACCINE  08/22/2015  . DTaP/Tdap/Td  Completed  . PNA vac Low Risk Adult  Completed      Plan:     I have personally reviewed and addressed the Medicare Annual Wellness questionnaire and have noted the following in the patient's chart:  A. Medical and social history B. Use of alcohol, tobacco or illicit drugs  C. Current medications and supplements D.  Functional ability and status E.  Nutritional status F.  Physical activity G. Advance directives H. List of other physicians I.  Hospitalizations, surgeries, and ER visits in previous 12 months J.  [place] to include hearing, vision, cognitive, depression L. Referrals and appointments - none  In addition, I have reviewed and discussed with patient certain preventive protocols, quality metrics, and best practice recommendations. A written personalized care plan for preventive services as well as general preventive health recommendations were provided to patient.  See attached scanned questionnaire for additional information.   Signed,   Lindell Noe, MHA, BS, LPN Health Advisor

## 2015-07-10 NOTE — Progress Notes (Signed)
I reviewed health advisor's note, was available for consultation, and agree with documentation and plan.  

## 2015-07-10 NOTE — Progress Notes (Signed)
PCP notes:  Health maintenance:   Tetanus - postponed/insurance Shingles - postponed/declined  Abnormal screenings:  Hearing - failed Cognitive - Mini-Cog score: 18/20  Patient concerns: Pt would like audiology referral and information about hearing aids  Nurse concerns: None  Next PCP appt: 07/19/15 @ 1130

## 2015-07-12 DIAGNOSIS — L720 Epidermal cyst: Secondary | ICD-10-CM | POA: Diagnosis not present

## 2015-07-12 DIAGNOSIS — L821 Other seborrheic keratosis: Secondary | ICD-10-CM | POA: Diagnosis not present

## 2015-07-12 DIAGNOSIS — D225 Melanocytic nevi of trunk: Secondary | ICD-10-CM | POA: Diagnosis not present

## 2015-07-19 ENCOUNTER — Ambulatory Visit (INDEPENDENT_AMBULATORY_CARE_PROVIDER_SITE_OTHER): Payer: Medicare Other | Admitting: Family Medicine

## 2015-07-19 ENCOUNTER — Encounter: Payer: Self-pay | Admitting: Family Medicine

## 2015-07-19 ENCOUNTER — Encounter: Payer: Self-pay | Admitting: Radiology

## 2015-07-19 VITALS — BP 116/76 | HR 62 | Temp 98.0°F | Ht 70.0 in | Wt 205.8 lb

## 2015-07-19 DIAGNOSIS — G250 Essential tremor: Secondary | ICD-10-CM

## 2015-07-19 DIAGNOSIS — N183 Chronic kidney disease, stage 3 unspecified: Secondary | ICD-10-CM

## 2015-07-19 DIAGNOSIS — Z7189 Other specified counseling: Secondary | ICD-10-CM | POA: Diagnosis not present

## 2015-07-19 DIAGNOSIS — H9193 Unspecified hearing loss, bilateral: Secondary | ICD-10-CM | POA: Diagnosis not present

## 2015-07-19 DIAGNOSIS — Z0000 Encounter for general adult medical examination without abnormal findings: Secondary | ICD-10-CM | POA: Insufficient documentation

## 2015-07-19 DIAGNOSIS — I10 Essential (primary) hypertension: Secondary | ICD-10-CM

## 2015-07-19 DIAGNOSIS — E785 Hyperlipidemia, unspecified: Secondary | ICD-10-CM

## 2015-07-19 DIAGNOSIS — M5417 Radiculopathy, lumbosacral region: Secondary | ICD-10-CM

## 2015-07-19 MED ORDER — METOPROLOL TARTRATE 50 MG PO TABS: 50.0000 mg | ORAL_TABLET | Freq: Two times a day (BID) | ORAL | Status: DC

## 2015-07-19 MED ORDER — ALPRAZOLAM 0.25 MG PO TABS: ORAL_TABLET | ORAL | Status: DC

## 2015-07-19 MED ORDER — DOXAZOSIN MESYLATE 4 MG PO TABS: ORAL_TABLET | ORAL | Status: DC

## 2015-07-19 MED ORDER — AMLODIPINE BESYLATE 5 MG PO TABS: 5.0000 mg | ORAL_TABLET | Freq: Every day | ORAL | Status: DC

## 2015-07-19 MED ORDER — FINASTERIDE 5 MG PO TABS: 5.0000 mg | ORAL_TABLET | Freq: Every day | ORAL | Status: DC

## 2015-07-19 NOTE — Addendum Note (Signed)
Addended by: Ellamae Sia on: 07/19/2015 12:23 PM   Modules accepted: Miquel Dunn

## 2015-07-19 NOTE — Assessment & Plan Note (Signed)
Advanced directives: scanned 07/2013 and in chart. Son is HCPOA (Dean Childrey). Doesn't want prolonged life support. 

## 2015-07-19 NOTE — Assessment & Plan Note (Signed)
Chronic, continue gabapentin  ?

## 2015-07-19 NOTE — Patient Instructions (Addendum)
Pass by Allison's office for referral to audiologist.  We will also refer you to neurologist. UDS today. Decrease amlodipine to 5mg  daily. Decrease vitamin b12 some.  Good to see you today, call us with quesitons. You are doing well today, return as needed or in 1 year for next physical.  Health Maintenance, Male A healthy lifestyle and preventative care can promote health and wellness.  Maintain regular health, dental, and eye exams.  Eat a healthy diet. Foods like vegetables, fruits, whole grains, low-fat dairy products, and lean protein foods contain the nutrients you need and are low in calories. Decrease your intake of foods high in solid fats, added sugars, and salt. Get information about a proper diet from your health care provider, if necessary.  Regular physical exercise is one of the most important things you can do for your health. Most adults should get at least 150 minutes of moderate-intensity exercise (any activity that increases your heart rate and causes you to sweat) each week. In addition, most adults need muscle-strengthening exercises on 2 or more days a week.   Maintain a healthy weight. The body mass index (BMI) is a screening tool to identify possible weight problems. It provides an estimate of body fat based on height and weight. Your health care provider can find your BMI and can help you achieve or maintain a healthy weight. For males 20 years and older:  A BMI below 18.5 is considered underweight.  A BMI of 18.5 to 24.9 is normal.  A BMI of 25 to 29.9 is considered overweight.  A BMI of 30 and above is considered obese.  Maintain normal blood lipids and cholesterol by exercising and minimizing your intake of saturated fat. Eat a balanced diet with plenty of fruits and vegetables. Blood tests for lipids and cholesterol should begin at age 68 and be repeated every 5 years. If your lipid or cholesterol levels are high, you are over age 24, or you are at high risk  for heart disease, you may need your cholesterol levels checked more frequently.Ongoing high lipid and cholesterol levels should be treated with medicines if diet and exercise are not working.  If you smoke, find out from your health care provider how to quit. If you do not use tobacco, do not start.  Lung cancer screening is recommended for adults aged 59-80 years who are at high risk for developing lung cancer because of a history of smoking. A yearly low-dose CT scan of the lungs is recommended for people who have at least a 30-pack-year history of smoking and are current smokers or have quit within the past 15 years. A pack year of smoking is smoking an average of 1 pack of cigarettes a day for 1 year (for example, a 30-pack-year history of smoking could mean smoking 1 pack a day for 30 years or 2 packs a day for 15 years). Yearly screening should continue until the smoker has stopped smoking for at least 15 years. Yearly screening should be stopped for people who develop a health problem that would prevent them from having lung cancer treatment.  If you choose to drink alcohol, do not have more than 2 drinks per day. One drink is considered to be 12 oz (360 mL) of beer, 5 oz (150 mL) of wine, or 1.5 oz (45 mL) of liquor.  Avoid the use of street drugs. Do not share needles with anyone. Ask for help if you need support or instructions about stopping the use of  drugs.  High blood pressure causes heart disease and increases the risk of stroke. High blood pressure is more likely to develop in:  People who have blood pressure in the end of the normal range (100-139/85-89 mm Hg).  People who are overweight or obese.  People who are African American.  If you are 56-63 years of age, have your blood pressure checked every 3-5 years. If you are 68 years of age or older, have your blood pressure checked every year. You should have your blood pressure measured twice--once when you are at a hospital or  clinic, and once when you are not at a hospital or clinic. Record the average of the two measurements. To check your blood pressure when you are not at a hospital or clinic, you can use:  An automated blood pressure machine at a pharmacy.  A home blood pressure monitor.  If you are 67-69 years old, ask your health care provider if you should take aspirin to prevent heart disease.  Diabetes screening involves taking a blood sample to check your fasting blood sugar level. This should be done once every 3 years after age 65 if you are at a normal weight and without risk factors for diabetes. Testing should be considered at a younger age or be carried out more frequently if you are overweight and have at least 1 risk factor for diabetes.  Colorectal cancer can be detected and often prevented. Most routine colorectal cancer screening begins at the age of 82 and continues through age 97. However, your health care provider may recommend screening at an earlier age if you have risk factors for colon cancer. On a yearly basis, your health care provider may provide home test kits to check for hidden blood in the stool. A small camera at the end of a tube may be used to directly examine the colon (sigmoidoscopy or colonoscopy) to detect the earliest forms of colorectal cancer. Talk to your health care provider about this at age 63 when routine screening begins. A direct exam of the colon should be repeated every 5-10 years through age 65, unless early forms of precancerous polyps or small growths are found.  People who are at an increased risk for hepatitis B should be screened for this virus. You are considered at high risk for hepatitis B if:  You were born in a country where hepatitis B occurs often. Talk with your health care provider about which countries are considered high risk.  Your parents were born in a high-risk country and you have not received a shot to protect against hepatitis B (hepatitis B  vaccine).  You have HIV or AIDS.  You use needles to inject street drugs.  You live with, or have sex with, someone who has hepatitis B.  You are a man who has sex with other men (MSM).  You get hemodialysis treatment.  You take certain medicines for conditions like cancer, organ transplantation, and autoimmune conditions.  Hepatitis C blood testing is recommended for all people born from [birth year] through 1963 and any individual with known risk factors for hepatitis C.  Healthy men should no longer receive prostate-specific antigen (PSA) blood tests as part of routine cancer screening. Talk to your health care provider about prostate cancer screening.  Testicular cancer screening is not recommended for adolescents or adult males who have no symptoms. Screening includes self-exam, a health care provider exam, and other screening tests. Consult with your health care provider about any symptoms you have  or any concerns you have about testicular cancer.  Practice safe sex. Use condoms and avoid high-risk sexual practices to reduce the spread of sexually transmitted infections (STIs).  You should be screened for STIs, including gonorrhea and chlamydia if:  You are sexually active and are younger than 24 years.  You are older than 24 years, and your health care provider tells you that you are at risk for this type of infection.  Your sexual activity has changed since you were last screened, and you are at an increased risk for chlamydia or gonorrhea. Ask your health care provider if you are at risk.  If you are at risk of being infected with HIV, it is recommended that you take a prescription medicine daily to prevent HIV infection. This is called pre-exposure prophylaxis (PrEP). You are considered at risk if:  You are a man who has sex with other men (MSM).  You are a heterosexual man who is sexually active with multiple partners.  You take drugs by injection.  You are sexually active  with a partner who has HIV.  Talk with your health care provider about whether you are at high risk of being infected with HIV. If you choose to begin PrEP, you should first be tested for HIV. You should then be tested every 3 months for as long as you are taking PrEP.  Use sunscreen. Apply sunscreen liberally and repeatedly throughout the day. You should seek shade when your shadow is shorter than you. Protect yourself by wearing long sleeves, pants, a wide-brimmed hat, and sunglasses year round whenever you are outdoors.  Tell your health care provider of new moles or changes in moles, especially if there is a change in shape or color. Also, tell your health care provider if a mole is larger than the size of a pencil eraser.  A one-time screening for abdominal aortic aneurysm (AAA) and surgical repair of large AAAs by ultrasound is recommended for men aged 67-75 years who are current or former smokers.  Stay current with your vaccines (immunizations).   This information is not intended to replace advice given to you by your health care provider. Make sure you discuss any questions you have with your health care provider.   Document Released: 07/06/2007 Document Revised: 01/28/2014 Document Reviewed: 06/04/2010 Elsevier Interactive Patient Education [employer].

## 2015-07-19 NOTE — Assessment & Plan Note (Signed)
Using amplifiers. Ready for audiology referral today.  Irrigation performed for R cerumen impaction today.

## 2015-07-19 NOTE — Assessment & Plan Note (Signed)
Chronic, stable. Knows to stay well hydrated, avoid NSAIDs.

## 2015-07-19 NOTE — Progress Notes (Signed)
Pre visit review using our clinic review tool, if applicable. No additional management support is needed unless otherwise documented below in the visit note. 

## 2015-07-19 NOTE — Progress Notes (Signed)
BP 116/76 mmHg  Pulse 62  Temp(Src) 98 F (36.7 C) (Oral)  Ht 5\' 10"  (1.778 m)  Wt 205 lb 12 oz (93.328 kg)  BMI 29.52 kg/m2  SpO2 94%   CC: CPE  Subjective:    Patient ID: [name], male    DOB: 1928/06/18, 82 y.o.   MRN: [id number]  HPI: RAYMEN TONDRE is a 82 y.o. male presenting on 07/19/2015 for Annual Exam   Saw Katha Cabal last week for medicare wellness visit, note reviewed. Requested audiology referral for failed hearing screen. Using amplifier (from [employer]) which helps.   Chronic lower back pain with L sciatica ([place]), completed 3 shots without improvement. On gabapentin.   Longterm xanax for essential tremor. Previously did not tolerate gabapentin, currently taking 300mg  BID for lumbar radiculopathy - not helpful for sciatica or significantly for ET. Takes TID, last filled [id number] 04/21/2015.   Pt travels in RV up to 200-300 miles per day, doesn't want med that could cause drowsiness.   Preventative: Prostate screening - BPH - has had elevated PSA in past but nomal biopsies. On cardura and finasteride. Denies voiding difficulty. We decided to stop PSA/DRE checks 2015. Colon cancer screening - aged out  Flu shot yearly  Td 2003  Pneumovax 2012. Prevnar 2015   zostavax - discussed. Declines. H/o shingles in past.  Advanced directives: scanned 07/2013 and in chart. Son is HCPOA Therapist, occupational [employer]). Doesn't want prolonged life support.  Seat belt use discussed.  Sees derm regularly.   Widowed, remarried Jun 30, 2007. Daughter deceased from metastatic melanoma  [facility] Activity: walks 1.5 miles 3d/wk, [facility] cardio and weight training Diet: good water, fruits/vegetables daily  Relevant past medical, surgical, family and social history reviewed and updated as indicated. Interim medical history since our last visit reviewed. Allergies and medications reviewed and updated. Current Outpatient Prescriptions on File Prior to Visit    Medication Sig  . FLAXSEED, LINSEED, PO Take 1 tablet by mouth 2 (two) times daily.   Marland Kitchen gabapentin (NEURONTIN) 300 MG capsule Take 1 capsule (300 mg total) by mouth 2 (two) times daily.  Marland Kitchen loratadine (CLARITIN) 10 MG tablet Take 1 tablet (10 mg total) by mouth daily as needed for allergies.   No current facility-administered medications on file prior to visit.    Review of Systems  Constitutional: Negative for fever, chills, activity change, appetite change, fatigue and unexpected weight change.  HENT: Negative for hearing loss.   Eyes: Negative for visual disturbance.  Respiratory: Negative for cough, chest tightness, shortness of breath and wheezing.   Cardiovascular: Negative for chest pain, palpitations and leg swelling.  Gastrointestinal: Negative for nausea, vomiting, abdominal pain, diarrhea, constipation, blood in stool and abdominal distention.  Genitourinary: Negative for hematuria and difficulty urinating.  Musculoskeletal: Negative for myalgias, arthralgias and neck pain.  Skin: Negative for rash.  Neurological: Negative for dizziness, seizures, syncope and headaches.  Hematological: Negative for adenopathy. Does not bruise/bleed easily.  Psychiatric/Behavioral: Negative for dysphoric mood. The patient is not nervous/anxious.    Per HPI unless specifically indicated in ROS section     Objective:    BP 116/76 mmHg  Pulse 62  Temp(Src) 98 F (36.7 C) (Oral)  Ht 5\' 10"  (1.778 m)  Wt 205 lb 12 oz (93.328 kg)  BMI 29.52 kg/m2  SpO2 94%  Wt Readings from Last 3 Encounters:  07/19/15 205 lb 12 oz (93.328 kg)  07/10/15 208 lb 4 oz (94.462 kg)  07/05/14 205 lb 4 oz (93.101 kg)    Physical Exam  Constitutional: He is oriented to person, place, and time. He appears well-developed and well-nourished. No distress.  HENT:  Head: Normocephalic and atraumatic.  Right Ear: Hearing, tympanic membrane, external ear and ear canal normal.  Left Ear: Hearing, tympanic membrane,  external ear and ear canal normal.  Nose: Nose normal.  Mouth/Throat: Uvula is midline, oropharynx is clear and moist and mucous membranes are normal. No oropharyngeal exudate, posterior oropharyngeal edema or posterior oropharyngeal erythema.  Eyes: Conjunctivae and EOM are normal. Pupils are equal, round, and reactive to light. No scleral icterus.  Neck: Normal range of motion. Neck supple. Carotid bruit is not present. No thyromegaly present.  Cardiovascular: Normal rate, regular rhythm, normal heart sounds and intact distal pulses.   No murmur heard. Pulses:      Radial pulses are 2+ on the right side, and 2+ on the left side.  Pulmonary/Chest: Effort normal and breath sounds normal. No respiratory distress. He has no wheezes. He has no rales.  Abdominal: Soft. Bowel sounds are normal. He exhibits no distension and no mass. There is no tenderness. There is no rebound and no guarding.  Musculoskeletal: Normal range of motion. He exhibits no edema.  Lymphadenopathy:    He has no cervical adenopathy.  Neurological: He is alert and oriented to person, place, and time.  CN grossly intact, station and gait intact  Skin: Skin is warm and dry. No rash noted.  Psychiatric: He has a normal mood and affect. His behavior is normal. Judgment and thought content normal.  Nursing note and vitals reviewed.  Results for orders placed or performed in visit on 07/10/15  Lipid panel  Result Value Ref Range   Cholesterol 202 (H) 0 - 200 mg/dL   Triglycerides 71.0 0.0 - 149.0 mg/dL   HDL 46.40 >39.00 mg/dL   VLDL 14.2 0.0 - 40.0 mg/dL   LDL Cholesterol 141 (H) 0 - 99 mg/dL   Total CHOL/HDL Ratio 4    NonHDL 155.41   Renal function panel  Result Value Ref Range   Sodium 140 135 - 145 mEq/L   Potassium 4.4 3.5 - 5.1 mEq/L   Chloride 105 96 - 112 mEq/L   CO2 26 19 - 32 mEq/L   Calcium 9.2 8.4 - 10.5 mg/dL   Albumin 4.0 3.5 - 5.2 g/dL   BUN 29 (H) 6 - 23 mg/dL   Creatinine, Ser 1.51 (H) 0.40 - 1.50  mg/dL   Glucose, Bld 104 (H) 70 - 99 mg/dL   Phosphorus 3.3 2.3 - 4.6 mg/dL   GFR 46.90 (L) >60.00 mL/min  CBC with Differential/Platelet  Result Value Ref Range   WBC 6.4 4.0 - 10.5 K/uL   RBC 4.55 4.22 - 5.81 Mil/uL   Hemoglobin 13.3 13.0 - 17.0 g/dL   HCT 38.7 (L) 39.0 - 52.0 %   MCV 85.1 78.0 - 100.0 fl   MCHC 34.4 30.0 - 36.0 g/dL   RDW 13.4 11.5 - 15.5 %   Platelets 182.0 150.0 - 400.0 K/uL   Neutrophils Relative % 50.3 43.0 - 77.0 %   Lymphocytes Relative 34.1 12.0 - 46.0 %   Monocytes Relative 7.6 3.0 - 12.0 %   Eosinophils Relative 7.6 (H) 0.0 - 5.0 %   Basophils Relative 0.4 0.0 - 3.0 %   Neutro Abs 3.2 1.4 - 7.7 K/uL   Lymphs Abs 2.2 0.7 - 4.0 K/uL   Monocytes Absolute 0.5 0.1 -  1.0 K/uL   Eosinophils Absolute 0.5 0.0 - 0.7 K/uL   Basophils Absolute 0.0 0.0 - 0.1 K/uL  VITAMIN D 25 Hydroxy (Vit-D Deficiency, Fractures)  Result Value Ref Range   VITD 36.90 30.00 - 100.00 ng/mL  Vitamin B12  Result Value Ref Range   Vitamin B-12 1103 (H) 211 - 911 pg/mL  TSH  Result Value Ref Range   TSH 4.34 0.35 - 4.50 uIU/mL      Assessment & Plan:   Problem List Items Addressed This Visit    HLD (hyperlipidemia)    Only on flax seed oil. Intolerant to lipitor in the past.  Discussed healthy diet to help control LDL.      Relevant Medications   amLODipine (NORVASC) 5 MG tablet   doxazosin (CARDURA) 4 MG tablet   metoprolol (LOPRESSOR) 50 MG tablet   Essential hypertension    Chronic, stable. Will decrease amlodipine to 5mg  daily. Continue metoprolol 50mg  bid      Relevant Medications   amLODipine (NORVASC) 5 MG tablet   doxazosin (CARDURA) 4 MG tablet   metoprolol (LOPRESSOR) 50 MG tablet   Essential tremor    Long term on xanax - helps to "decrease social embarrassment" as well.  Gabapentin 300mg  bid not as helpful as would like (using for radiculopathy per ortho). Already on beta blocker. Hesitant for primidone if sedation/somnolence risk.  Requests referral  to neuro for evaluation, see if any other options available to treat ET.       Relevant Orders   Ambulatory referral to Neurology   High frequency hearing loss    Using amplifiers. Ready for audiology referral today.  Irrigation performed for R cerumen impaction today.       Relevant Orders   Ambulatory referral to Audiology   Lumbosacral radiculopathy at L5    Chronic, continue gabapentin.       Relevant Medications   ALPRAZolam (XANAX) 0.25 MG tablet   CKD (chronic kidney disease) stage 3, GFR 30-59 ml/min    Chronic, stable. Knows to stay well hydrated, avoid NSAIDs.       Advanced care planning/counseling discussion    Advanced directives: scanned 07/2013 and in chart. Son is HCPOA Therapist, occupational [employer]). Doesn't want prolonged life support.       Health maintenance examination - Primary    Preventative protocols reviewed and updated unless pt declined. Discussed healthy diet and lifestyle.           Follow up plan: Return in about 1 year (around 07/18/2016), or as needed, for annual exam, prior fasting for blood work.  Ria Bush, MD

## 2015-07-19 NOTE — Assessment & Plan Note (Signed)
Chronic, stable. Will decrease amlodipine to 5mg  daily. Continue metoprolol 50mg  bid

## 2015-07-19 NOTE — Assessment & Plan Note (Signed)
Long term on xanax - helps to "decrease social embarrassment" as well.  Gabapentin 300mg  bid not as helpful as would like (using for radiculopathy per ortho). Already on beta blocker. Hesitant for primidone if sedation/somnolence risk.  Requests referral to neuro for evaluation, see if any other options available to treat ET.

## 2015-07-19 NOTE — Assessment & Plan Note (Signed)
Only on flax seed oil. Intolerant to lipitor in the past.  Discussed healthy diet to help control LDL.

## 2015-07-19 NOTE — Assessment & Plan Note (Signed)
Preventative protocols reviewed and updated unless pt declined. Discussed healthy diet and lifestyle.  

## 2015-07-20 ENCOUNTER — Encounter: Payer: Self-pay | Admitting: Family Medicine

## 2015-07-26 ENCOUNTER — Encounter: Payer: Self-pay | Admitting: Family Medicine

## 2015-08-15 ENCOUNTER — Ambulatory Visit: Payer: Medicare Other

## 2015-08-16 DIAGNOSIS — M4726 Other spondylosis with radiculopathy, lumbar region: Secondary | ICD-10-CM | POA: Diagnosis not present

## 2015-08-16 DIAGNOSIS — M5416 Radiculopathy, lumbar region: Secondary | ICD-10-CM | POA: Diagnosis not present

## 2015-08-25 DIAGNOSIS — M5416 Radiculopathy, lumbar region: Secondary | ICD-10-CM | POA: Diagnosis not present

## 2015-08-25 DIAGNOSIS — M5136 Other intervertebral disc degeneration, lumbar region: Secondary | ICD-10-CM | POA: Diagnosis not present

## 2015-08-25 DIAGNOSIS — M4806 Spinal stenosis, lumbar region: Secondary | ICD-10-CM | POA: Diagnosis not present

## 2015-08-28 DIAGNOSIS — H6123 Impacted cerumen, bilateral: Secondary | ICD-10-CM | POA: Diagnosis not present

## 2015-08-28 DIAGNOSIS — H903 Sensorineural hearing loss, bilateral: Secondary | ICD-10-CM | POA: Diagnosis not present

## 2015-09-10 ENCOUNTER — Encounter: Payer: Self-pay | Admitting: Family Medicine

## 2015-09-22 DIAGNOSIS — M5126 Other intervertebral disc displacement, lumbar region: Secondary | ICD-10-CM | POA: Diagnosis not present

## 2015-09-22 DIAGNOSIS — M5416 Radiculopathy, lumbar region: Secondary | ICD-10-CM | POA: Diagnosis not present

## 2015-09-22 DIAGNOSIS — M4316 Spondylolisthesis, lumbar region: Secondary | ICD-10-CM | POA: Diagnosis not present

## 2015-09-22 DIAGNOSIS — M4726 Other spondylosis with radiculopathy, lumbar region: Secondary | ICD-10-CM | POA: Diagnosis not present

## 2015-09-26 DIAGNOSIS — M5442 Lumbago with sciatica, left side: Secondary | ICD-10-CM | POA: Diagnosis not present

## 2015-09-26 DIAGNOSIS — M5136 Other intervertebral disc degeneration, lumbar region: Secondary | ICD-10-CM | POA: Diagnosis not present

## 2015-09-26 DIAGNOSIS — M4316 Spondylolisthesis, lumbar region: Secondary | ICD-10-CM | POA: Diagnosis not present

## 2015-09-26 DIAGNOSIS — G8929 Other chronic pain: Secondary | ICD-10-CM | POA: Diagnosis not present

## 2015-09-26 DIAGNOSIS — M5416 Radiculopathy, lumbar region: Secondary | ICD-10-CM | POA: Diagnosis not present

## 2015-09-27 ENCOUNTER — Other Ambulatory Visit: Payer: Self-pay | Admitting: Orthopedic Surgery

## 2015-09-27 DIAGNOSIS — M5416 Radiculopathy, lumbar region: Secondary | ICD-10-CM

## 2015-09-27 DIAGNOSIS — M4300 Spondylolysis, site unspecified: Secondary | ICD-10-CM

## 2015-10-02 DIAGNOSIS — M5442 Lumbago with sciatica, left side: Secondary | ICD-10-CM | POA: Diagnosis not present

## 2015-10-02 DIAGNOSIS — G8929 Other chronic pain: Secondary | ICD-10-CM | POA: Diagnosis not present

## 2015-10-02 DIAGNOSIS — M5416 Radiculopathy, lumbar region: Secondary | ICD-10-CM | POA: Diagnosis not present

## 2015-10-02 DIAGNOSIS — M4316 Spondylolisthesis, lumbar region: Secondary | ICD-10-CM | POA: Diagnosis not present

## 2015-10-07 ENCOUNTER — Ambulatory Visit
Admission: RE | Admit: 2015-10-07 | Discharge: 2015-10-07 | Disposition: A | Discharge status: Home / Self Care | Payer: Medicare Other | Source: Ambulatory Visit | Attending: Orthopedic Surgery | Admitting: Orthopedic Surgery

## 2015-10-07 DIAGNOSIS — M4726 Other spondylosis with radiculopathy, lumbar region: Secondary | ICD-10-CM | POA: Insufficient documentation

## 2015-10-07 DIAGNOSIS — M5127 Other intervertebral disc displacement, lumbosacral region: Secondary | ICD-10-CM | POA: Insufficient documentation

## 2015-10-07 DIAGNOSIS — M5126 Other intervertebral disc displacement, lumbar region: Secondary | ICD-10-CM | POA: Insufficient documentation

## 2015-10-07 DIAGNOSIS — M5416 Radiculopathy, lumbar region: Secondary | ICD-10-CM

## 2015-10-07 DIAGNOSIS — M4316 Spondylolisthesis, lumbar region: Secondary | ICD-10-CM | POA: Insufficient documentation

## 2015-10-07 DIAGNOSIS — M5136 Other intervertebral disc degeneration, lumbar region: Secondary | ICD-10-CM | POA: Diagnosis not present

## 2015-10-07 DIAGNOSIS — M4300 Spondylolysis, site unspecified: Secondary | ICD-10-CM

## 2015-10-09 ENCOUNTER — Ambulatory Visit
Admission: RE | Admit: 2015-10-09 | Discharge: 2015-10-09 | Disposition: A | Discharge status: Home / Self Care | Payer: Medicare Other | Source: Ambulatory Visit | Attending: Orthopedic Surgery | Admitting: Orthopedic Surgery

## 2015-10-09 DIAGNOSIS — M5137 Other intervertebral disc degeneration, lumbosacral region: Secondary | ICD-10-CM | POA: Diagnosis not present

## 2015-10-09 DIAGNOSIS — I700 Atherosclerosis of aorta: Secondary | ICD-10-CM | POA: Diagnosis not present

## 2015-10-09 DIAGNOSIS — M5416 Radiculopathy, lumbar region: Secondary | ICD-10-CM | POA: Diagnosis not present

## 2015-10-09 DIAGNOSIS — M5136 Other intervertebral disc degeneration, lumbar region: Secondary | ICD-10-CM | POA: Diagnosis not present

## 2015-10-09 DIAGNOSIS — M4300 Spondylolysis, site unspecified: Secondary | ICD-10-CM

## 2015-10-10 DIAGNOSIS — G250 Essential tremor: Secondary | ICD-10-CM | POA: Insufficient documentation

## 2015-10-19 DIAGNOSIS — H524 Presbyopia: Secondary | ICD-10-CM | POA: Diagnosis not present

## 2015-10-19 DIAGNOSIS — Z961 Presence of intraocular lens: Secondary | ICD-10-CM | POA: Diagnosis not present

## 2015-10-24 DIAGNOSIS — M5416 Radiculopathy, lumbar region: Secondary | ICD-10-CM | POA: Diagnosis not present

## 2015-10-24 DIAGNOSIS — M4316 Spondylolisthesis, lumbar region: Secondary | ICD-10-CM | POA: Diagnosis not present

## 2015-11-06 DIAGNOSIS — J069 Acute upper respiratory infection, unspecified: Secondary | ICD-10-CM | POA: Diagnosis not present

## 2015-11-06 DIAGNOSIS — R0982 Postnasal drip: Secondary | ICD-10-CM | POA: Diagnosis not present

## 2015-12-11 DIAGNOSIS — G250 Essential tremor: Secondary | ICD-10-CM | POA: Diagnosis not present

## 2015-12-28 ENCOUNTER — Other Ambulatory Visit: Payer: Self-pay | Admitting: *Deleted

## 2015-12-28 NOTE — Telephone Encounter (Signed)
Ok to refill in Dr. Synthia Innocent absence? Last filled 07/19/15 #[id number]. Will need written Rx for mail order. Please give to me. Thanks

## 2015-12-29 MED ORDER — ALPRAZOLAM 0.25 MG PO TABS: ORAL_TABLET | ORAL | 0 refills | Status: DC

## 2015-12-29 NOTE — Telephone Encounter (Signed)
Rx faxed to mail order 

## 2015-12-29 NOTE — Telephone Encounter (Signed)
Could you please print this? Mail order requires a written Rx. Thanks

## 2015-12-29 NOTE — Telephone Encounter (Signed)
I thought this could be done as call in.  Please either ask someone in office or I'll print and sign on Monday.  Thanks.

## 2016-01-05 ENCOUNTER — Telehealth: Payer: Self-pay

## 2016-01-05 NOTE — Telephone Encounter (Signed)
Can you please find out if patient is also taking Valium? I filed this in Dr. Bosie Clos absence.

## 2016-01-05 NOTE — Telephone Encounter (Signed)
Message left for patient to return my call.  

## 2016-01-05 NOTE — Telephone Encounter (Signed)
Wife called, please call back at [phone] Thanks

## 2016-01-05 NOTE — Telephone Encounter (Signed)
[facility] left v/m requesting cb to clarify rx for alprazolam sent to [facility] on 12/29/15. [facility] records has pt on valium by Dr Melrose Nakayama; this duplication of therapy and wants to know if pt should be on both meds. [facility] request cb with ref # [id number].

## 2016-01-08 NOTE — Telephone Encounter (Signed)
[name] with [facility] request cb with clarification if pt should be taking valium prescribed by Dr Melrose Nakayama and alprazolam.

## 2016-01-10 NOTE — Telephone Encounter (Signed)
Spoke with patient's wife who confirmed that patient was only taking diazepam, not alprazolam and he was doing very well. [facility] notified and med removed from profile.

## 2016-01-16 ENCOUNTER — Telehealth: Payer: Self-pay | Admitting: *Deleted

## 2016-01-16 NOTE — Telephone Encounter (Signed)
Fax from [facility] requesting medical clarification: Patient is currently on Valium Tab 2 mg (Dr. Melrose Nakayama) and has also been prescribed Alprazolam Tab 0.25 mg which is a possible duplication of therapy.  Should the patient be on both medications?  Form in Dr Synthia Innocent In Box.

## 2016-01-16 NOTE — Telephone Encounter (Signed)
See last week's phone note.  Placed in Kim's box.

## 2016-01-17 NOTE — Telephone Encounter (Signed)
Form faxed

## 2016-05-23 ENCOUNTER — Other Ambulatory Visit: Payer: Self-pay | Admitting: Family Medicine

## 2016-05-23 NOTE — Telephone Encounter (Signed)
Last CPX 07/19/15, next CPX 07/19/16-refills sent

## 2016-07-10 ENCOUNTER — Other Ambulatory Visit: Payer: Self-pay | Admitting: Dermatology

## 2016-07-10 DIAGNOSIS — D2312 Other benign neoplasm of skin of left eyelid, including canthus: Secondary | ICD-10-CM | POA: Diagnosis not present

## 2016-07-10 DIAGNOSIS — L821 Other seborrheic keratosis: Secondary | ICD-10-CM | POA: Diagnosis not present

## 2016-07-10 DIAGNOSIS — D225 Melanocytic nevi of trunk: Secondary | ICD-10-CM | POA: Diagnosis not present

## 2016-07-10 DIAGNOSIS — D485 Neoplasm of uncertain behavior of skin: Secondary | ICD-10-CM | POA: Diagnosis not present

## 2016-07-10 DIAGNOSIS — L814 Other melanin hyperpigmentation: Secondary | ICD-10-CM | POA: Diagnosis not present

## 2016-07-14 ENCOUNTER — Other Ambulatory Visit: Payer: Self-pay | Admitting: Family Medicine

## 2016-07-14 DIAGNOSIS — N183 Chronic kidney disease, stage 3 unspecified: Secondary | ICD-10-CM

## 2016-07-14 DIAGNOSIS — E785 Hyperlipidemia, unspecified: Secondary | ICD-10-CM

## 2016-07-14 DIAGNOSIS — N400 Enlarged prostate without lower urinary tract symptoms: Secondary | ICD-10-CM

## 2016-07-16 ENCOUNTER — Other Ambulatory Visit (INDEPENDENT_AMBULATORY_CARE_PROVIDER_SITE_OTHER): Payer: Medicare Other

## 2016-07-16 DIAGNOSIS — E785 Hyperlipidemia, unspecified: Secondary | ICD-10-CM | POA: Diagnosis not present

## 2016-07-16 DIAGNOSIS — N183 Chronic kidney disease, stage 3 unspecified: Secondary | ICD-10-CM

## 2016-07-16 LAB — COMPREHENSIVE METABOLIC PANEL
ALK PHOS: 52 U/L (ref 39–117)
ALT: 12 U/L (ref 0–53)
AST: 15 U/L (ref 0–37)
Albumin: 4 g/dL (ref 3.5–5.2)
BILIRUBIN TOTAL: 0.7 mg/dL (ref 0.2–1.2)
BUN: 27 mg/dL — ABNORMAL HIGH (ref 6–23)
CO2: 30 meq/L (ref 19–32)
CREATININE: 1.52 mg/dL — AB (ref 0.40–1.50)
Calcium: 9.5 mg/dL (ref 8.4–10.5)
Chloride: 105 mEq/L (ref 96–112)
GFR: 46.43 mL/min — AB (ref >60.00)
GLUCOSE: 104 mg/dL — AB (ref 70–99)
Potassium: 4.4 mEq/L (ref 3.5–5.1)
Sodium: 140 mEq/L (ref 135–145)
TOTAL PROTEIN: 6.1 g/dL (ref 6.0–8.3)

## 2016-07-16 LAB — CBC WITH DIFFERENTIAL/PLATELET
BASOS ABS: 0 10*3/uL (ref 0.0–0.1)
Basophils Relative: 0.6 % (ref 0.0–3.0)
EOS ABS: 0.4 10*3/uL (ref 0.0–0.7)
Eosinophils Relative: 5.6 % — ABNORMAL HIGH (ref 0.0–5.0)
HEMATOCRIT: 37.6 % — AB (ref 39.0–52.0)
Hemoglobin: 13 g/dL (ref 13.0–17.0)
LYMPHS ABS: 2.1 10*3/uL (ref 0.7–4.0)
LYMPHS PCT: 32.1 % (ref 12.0–46.0)
MCHC: 34.7 g/dL (ref 30.0–36.0)
MCV: 86 fl (ref 78.0–100.0)
Monocytes Absolute: 0.6 10*3/uL (ref 0.1–1.0)
Monocytes Relative: 8.7 % (ref 3.0–12.0)
NEUTROS ABS: 3.5 10*3/uL (ref 1.4–7.7)
NEUTROS PCT: 53 % (ref 43.0–77.0)
Platelets: 181 10*3/uL (ref 150.0–400.0)
RBC: 4.37 Mil/uL (ref 4.22–5.81)
RDW: 13.5 % (ref 11.5–15.5)
WBC: 6.6 10*3/uL (ref 4.0–10.5)

## 2016-07-16 LAB — LIPID PANEL
CHOL/HDL RATIO: 4
Cholesterol: 180 mg/dL (ref 0–200)
HDL: 42.1 mg/dL (ref >39.00)
LDL Cholesterol: 122 mg/dL — ABNORMAL HIGH (ref 0–99)
NONHDL: 137.57
TRIGLYCERIDES: 80 mg/dL (ref 0.0–149.0)
VLDL: 16 mg/dL (ref 0.0–40.0)

## 2016-07-16 LAB — VITAMIN D 25 HYDROXY (VIT D DEFICIENCY, FRACTURES): VITD: 37.13 ng/mL (ref 30.00–100.00)

## 2016-07-16 LAB — MICROALBUMIN / CREATININE URINE RATIO
Creatinine,U: 110.4 mg/dL
MICROALB/CREAT RATIO: 5.1 mg/g (ref 0.0–30.0)
Microalb, Ur: 5.6 mg/dL — ABNORMAL HIGH (ref 0.0–1.9)

## 2016-07-18 ENCOUNTER — Encounter: Payer: Self-pay | Admitting: Family Medicine

## 2016-07-18 DIAGNOSIS — L989 Disorder of the skin and subcutaneous tissue, unspecified: Secondary | ICD-10-CM | POA: Insufficient documentation

## 2016-07-19 ENCOUNTER — Ambulatory Visit (INDEPENDENT_AMBULATORY_CARE_PROVIDER_SITE_OTHER): Payer: Medicare Other | Admitting: Family Medicine

## 2016-07-19 ENCOUNTER — Encounter: Payer: Self-pay | Admitting: Family Medicine

## 2016-07-19 VITALS — BP 130/70 | HR 57 | Ht 69.5 in | Wt 206.0 lb

## 2016-07-19 DIAGNOSIS — Z0000 Encounter for general adult medical examination without abnormal findings: Secondary | ICD-10-CM

## 2016-07-19 DIAGNOSIS — N183 Chronic kidney disease, stage 3 unspecified: Secondary | ICD-10-CM

## 2016-07-19 DIAGNOSIS — N400 Enlarged prostate without lower urinary tract symptoms: Secondary | ICD-10-CM

## 2016-07-19 DIAGNOSIS — I10 Essential (primary) hypertension: Secondary | ICD-10-CM

## 2016-07-19 DIAGNOSIS — G250 Essential tremor: Secondary | ICD-10-CM

## 2016-07-19 DIAGNOSIS — E785 Hyperlipidemia, unspecified: Secondary | ICD-10-CM

## 2016-07-19 DIAGNOSIS — E559 Vitamin D deficiency, unspecified: Secondary | ICD-10-CM

## 2016-07-19 DIAGNOSIS — M5417 Radiculopathy, lumbosacral region: Secondary | ICD-10-CM

## 2016-07-19 DIAGNOSIS — Z7189 Other specified counseling: Secondary | ICD-10-CM

## 2016-07-19 DIAGNOSIS — N529 Male erectile dysfunction, unspecified: Secondary | ICD-10-CM

## 2016-07-19 NOTE — Assessment & Plan Note (Signed)
Chronic, stable. Continue flax seed oil.  The ASCVD Risk score Mikey Bussing DC Jr., et al., 2013) failed to calculate for the following reasons:   The 2013 ASCVD risk score is only valid for ages 62 to 78

## 2016-07-19 NOTE — Assessment & Plan Note (Signed)
Advanced directives: scanned 07/2013 and in chart. Son is HCPOA (Dean [place]). Doesn't want prolonged life support. 

## 2016-07-19 NOTE — Assessment & Plan Note (Signed)
Levels now repleted.

## 2016-07-19 NOTE — Assessment & Plan Note (Signed)
Preventative protocols reviewed and updated unless pt declined. Discussed healthy diet and lifestyle.  

## 2016-07-19 NOTE — Assessment & Plan Note (Signed)

## 2016-07-19 NOTE — Assessment & Plan Note (Signed)
PDE5 inhibitors ineffective.

## 2016-07-19 NOTE — Progress Notes (Signed)
BP 130/70   Pulse (!) 57   Ht 5' 9.5" (1.765 m)   Wt 206 lb (93.4 kg)   SpO2 97%   BMI 29.98 kg/m    CC: CPE Subjective:    Patient ID: Shawn Andrews, male    DOB: 24-Apr-1928, 83 y.o.   MRN: [id number]  HPI: Shawn Andrews is a 83 y.o. male presenting on 07/19/2016 for Medicare Wellness   Has established with Dr Melrose Nakayama for essential tremor, placed on diazepam 2mg  bid prn and doing well with this. Also takes gabapentin 100mg  bid.  Ongoing left sciatica saw Dr Marry Guan. Has seen neurosurgery who didn't recommend surgery. Takes aleve intermittently. Found vertebral fracture at L5 ?congenital vs other. Continues regular exercise regimen.   No falls in the past year Denies depression/anhedonia Wears hearing aides Sees eye doctor yearly  Preventative: Prostate screening - BPH - has had elevated PSA in past but nomal biopsies. On cardura and finasteride. Denies voiding difficulty. We decided to stop PSA/DRE checks 2015. Colon cancer screening - aged out  Flu shot yearly  Td 2003  Pneumovax 2012. Prevnar 2015   zostavax - discussed. Declines. H/o shingles in past.  shingrix - declines Advanced directives: scanned 07/2013 and in chart. Son is HCPOA Therapist, occupational [place]). Doesn't want prolonged life support.  Seat belt use discussed.  Sunscreen use discussed. No changing moles on skin. Sees derm regularly.  Ex smoker - quit 2002. Some chewing tobacco Alcohol - seldom  Widowed, remarried 08/01/07. Daughter deceased from metastatic melanoma  Lives at [facility] Activity: walks 1.5 miles 3d/wk, [facility] cardio and weight training Diet: good water, fruits/vegetables daily  Relevant past medical, surgical, family and social history reviewed and updated as indicated. Interim medical history since our last visit reviewed. Allergies and medications reviewed and updated. Outpatient Medications Prior to Visit  Medication Sig Dispense Refill  . amLODipine (NORVASC) 5 MG  tablet TAKE 1 TABLET BY MOUTH  DAILY 90 tablet 3  . doxazosin (CARDURA) 4 MG tablet TAKE 1 TABLET BY MOUTH  DAILY 90 tablet 3  . finasteride (PROSCAR) 5 MG tablet TAKE 1 TABLET BY MOUTH  DAILY 90 tablet 3  . FLAXSEED, LINSEED, PO Take 1 tablet by mouth 2 (two) times daily.     Marland Kitchen gabapentin (NEURONTIN) 100 MG capsule Take 100 mg by mouth 2 (two) times daily as needed.    . loratadine (CLARITIN) 10 MG tablet Take 1 tablet (10 mg total) by mouth daily as needed for allergies. 30 tablet   . metoprolol (LOPRESSOR) 50 MG tablet TAKE 1 TABLET BY MOUTH TWO  TIMES DAILY 180 tablet 3  . vitamin B-12 (CYANOCOBALAMIN) 500 MCG tablet Take 500 mcg by mouth daily.     No facility-administered medications prior to visit.      Per HPI unless specifically indicated in ROS section below Review of Systems  Constitutional: Negative for activity change, appetite change, chills, fatigue, fever and unexpected weight change.  HENT: Negative for hearing loss.   Eyes: Negative for visual disturbance.  Respiratory: Negative for cough, chest tightness, shortness of breath and wheezing.   Cardiovascular: Negative for chest pain, palpitations and leg swelling.  Gastrointestinal: Negative for abdominal distention, abdominal pain, blood in stool, constipation, diarrhea, nausea and vomiting.  Genitourinary: Negative for difficulty urinating and hematuria.  Musculoskeletal: Negative for arthralgias, myalgias and neck pain.  Skin: Negative for rash.  Neurological: Negative for dizziness, seizures, syncope and headaches.  Hematological: Negative for adenopathy. Does  not bruise/bleed easily.  Psychiatric/Behavioral: Negative for dysphoric mood. The patient is not nervous/anxious.        Objective:    BP 130/70   Pulse (!) 57   Ht 5' 9.5" (1.765 m)   Wt 206 lb (93.4 kg)   SpO2 97%   BMI 29.98 kg/m   Wt Readings from Last 3 Encounters:  07/19/16 206 lb (93.4 kg)  07/19/15 205 lb 12 oz (93.3 kg)  07/10/15 208 lb 4  oz (94.5 kg)    Physical Exam  Constitutional: He is oriented to person, place, and time. He appears well-developed and well-nourished. No distress.  HENT:  Head: Normocephalic and atraumatic.  Right Ear: Hearing, tympanic membrane, external ear and ear canal normal.  Left Ear: Hearing, tympanic membrane, external ear and ear canal normal.  Nose: Nose normal.  Mouth/Throat: Uvula is midline, oropharynx is clear and moist and mucous membranes are normal. No oropharyngeal exudate, posterior oropharyngeal edema or posterior oropharyngeal erythema.  Eyes: Conjunctivae and EOM are normal. Pupils are equal, round, and reactive to light. No scleral icterus.  Neck: Normal range of motion. Neck supple. Carotid bruit is not present. No thyromegaly present.  Cardiovascular: Normal rate, regular rhythm, normal heart sounds and intact distal pulses.   No murmur heard. Pulses:      Radial pulses are 2+ on the right side, and 2+ on the left side.  Pulmonary/Chest: Effort normal and breath sounds normal. No respiratory distress. He has no wheezes. He has no rales.  Abdominal: Soft. Bowel sounds are normal. He exhibits no distension and no mass. There is no tenderness. There is no rebound and no guarding.  Musculoskeletal: Normal range of motion. He exhibits no edema.  Lymphadenopathy:    He has no cervical adenopathy.  Neurological: He is alert and oriented to person, place, and time.  CN grossly intact, station and gait intact Recall 2/3, even with cue Calculation 5/5 serial 3s  Skin: Skin is warm and dry. No rash noted.  Psychiatric: He has a normal mood and affect. His behavior is normal. Judgment and thought content normal.  Nursing note and vitals reviewed.  Results for orders placed or performed in visit on 07/16/16  Lipid panel  Result Value Ref Range   Cholesterol 180 0 - 200 mg/dL   Triglycerides 80.0 0.0 - 149.0 mg/dL   HDL 42.10 >39.00 mg/dL   VLDL 16.0 0.0 - 40.0 mg/dL   LDL  Cholesterol 122 (H) 0 - 99 mg/dL   Total CHOL/HDL Ratio 4    NonHDL 137.57   Comprehensive metabolic panel  Result Value Ref Range   Sodium 140 135 - 145 mEq/L   Potassium 4.4 3.5 - 5.1 mEq/L   Chloride 105 96 - 112 mEq/L   CO2 30 19 - 32 mEq/L   Glucose, Bld 104 (H) 70 - 99 mg/dL   BUN 27 (H) 6 - 23 mg/dL   Creatinine, Ser 1.52 (H) 0.40 - 1.50 mg/dL   Total Bilirubin 0.7 0.2 - 1.2 mg/dL   Alkaline Phosphatase 52 39 - 117 U/L   AST 15 0 - 37 U/L   ALT 12 0 - 53 U/L   Total Protein 6.1 6.0 - 8.3 g/dL   Albumin 4.0 3.5 - 5.2 g/dL   Calcium 9.5 8.4 - 10.5 mg/dL   GFR 46.43 (L) >60.00 mL/min  Microalbumin / creatinine urine ratio  Result Value Ref Range   Microalb, Ur 5.6 (H) 0.0 - 1.9 mg/dL   Creatinine,U 110.4  mg/dL   Microalb Creat Ratio 5.1 0.0 - 30.0 mg/g  VITAMIN D 25 Hydroxy (Vit-D Deficiency, Fractures)  Result Value Ref Range   VITD 37.13 30.00 - 100.00 ng/mL  CBC with Differential/Platelet  Result Value Ref Range   WBC 6.6 4.0 - 10.5 K/uL   RBC 4.37 4.22 - 5.81 Mil/uL   Hemoglobin 13.0 13.0 - 17.0 g/dL   HCT 37.6 (L) 39.0 - 52.0 %   MCV 86.0 78.0 - 100.0 fl   MCHC 34.7 30.0 - 36.0 g/dL   RDW 13.5 11.5 - 15.5 %   Platelets 181.0 150.0 - 400.0 K/uL   Neutrophils Relative % 53.0 43.0 - 77.0 %   Lymphocytes Relative 32.1 12.0 - 46.0 %   Monocytes Relative 8.7 3.0 - 12.0 %   Eosinophils Relative 5.6 (H) 0.0 - 5.0 %   Basophils Relative 0.6 0.0 - 3.0 %   Neutro Abs 3.5 1.4 - 7.7 K/uL   Lymphs Abs 2.1 0.7 - 4.0 K/uL   Monocytes Absolute 0.6 0.1 - 1.0 K/uL   Eosinophils Absolute 0.4 0.0 - 0.7 K/uL   Basophils Absolute 0.0 0.0 - 0.1 K/uL      Assessment & Plan:   Problem List Items Addressed This Visit    Advanced care planning/counseling discussion    Advanced directives: scanned 07/2013 and in chart. Son is HCPOA Therapist, occupational [place]). Doesn't want prolonged life support.       Benign prostatic hyperplasia    Continue proscar and alpha blocker.       CKD (chronic  kidney disease) stage 3, GFR 30-59 ml/min    Reviewed with patient. Encouraged good hydration status.      Essential hypertension    Chronic, stable. Continue current regimen.       Essential tremor    Has established with neurology Dr Melrose Nakayama. Transitioned from xanax to valium with endorsed improvement. Appreciate his care.       Health maintenance examination    Preventative protocols reviewed and updated unless pt declined. Discussed healthy diet and lifestyle.       HLD (hyperlipidemia)    Chronic, stable. Continue flax seed oil.  The ASCVD Risk score Mikey Bussing DC Jr., et al., 2013) failed to calculate for the following reasons:   The 2013 ASCVD risk score is only valid for ages 1 to 69       Lumbosacral radiculopathy at L5    Chronic despite gabapentin and ESI.       Relevant Medications   diazepam (VALIUM) 2 MG tablet   Medicare annual wellness visit, subsequent - Primary    I have personally reviewed the Medicare Annual Wellness questionnaire and have noted 1. The patient's medical and social history 2. Their use of alcohol, tobacco or illicit drugs 3. Their current medications and supplements 4. The patient's functional ability including ADL's, fall risks, home safety risks and hearing or visual impairment. Cognitive function has been assessed and addressed as indicated.  5. Diet and physical activity 6. Evidence for depression or mood disorders The patients weight, height, BMI have been recorded in the chart. I have made referrals, counseling and provided education to the patient based on review of the above and I have provided the pt with a written personalized care plan for preventive services. Provider list updated.. See scanned questionairre as needed for further documentation. Reviewed preventative protocols and updated unless pt declined.       ORGANIC IMPOTENCE    PDE5 inhibitors ineffective.  Vitamin D deficiency    Levels now repleted.             Follow up plan: Return in about 1 year (around 07/19/2017), or if symptoms worsen or fail to improve.  Ria Bush, MD

## 2016-07-19 NOTE — Assessment & Plan Note (Signed)
Chronic, stable. Continue current regimen. 

## 2016-07-19 NOTE — Assessment & Plan Note (Signed)
Continue proscar and alpha blocker 

## 2016-07-19 NOTE — Assessment & Plan Note (Signed)
Reviewed with patient. Encouraged good hydration status.

## 2016-07-19 NOTE — Assessment & Plan Note (Addendum)
Has established with neurology Dr Melrose Nakayama. Transitioned from xanax to valium with endorsed improvement. Appreciate his care.

## 2016-07-19 NOTE — Assessment & Plan Note (Signed)
Chronic despite gabapentin and ESI.

## 2016-07-19 NOTE — Patient Instructions (Addendum)
Return in 1 year for next medicare wellness visit with Katha Cabal and physical with me.  You are doing well today.  Good to see you today, call us with questions.   Health Maintenance, Male A healthy lifestyle and preventive care is important for your health and wellness. Ask your health care provider about what schedule of regular examinations is right for you. What should I know about weight and diet? Eat a Healthy Diet  Eat plenty of vegetables, fruits, whole grains, low-fat dairy products, and lean protein.  Do not eat a lot of foods high in solid fats, added sugars, or salt.  Maintain a Healthy Weight Regular exercise can help you achieve or maintain a healthy weight. You should:  Do at least 150 minutes of exercise each week. The exercise should increase your heart rate and make you sweat (moderate-intensity exercise).  Do strength-training exercises at least twice a week.  Watch Your Levels of Cholesterol and Blood Lipids  Have your blood tested for lipids and cholesterol every 5 years starting at 81 years of age. If you are at high risk for heart disease, you should start having your blood tested when you are 81 years old. You may need to have your cholesterol levels checked more often if: ? Your lipid or cholesterol levels are high. ? You are older than 81 years of age. ? You are at high risk for heart disease.  What should I know about cancer screening? Many types of cancers can be detected early and may often be prevented. Lung Cancer  You should be screened every year for lung cancer if: ? You are a current smoker who has smoked for at least 30 years. ? You are a former smoker who has quit within the past 15 years.  Talk to your health care provider about your screening options, when you should start screening, and how often you should be screened.  Colorectal Cancer  Routine colorectal cancer screening usually begins at 81 years of age and should be repeated every 5-10  years until you are 81 years old. You may need to be screened more often if early forms of precancerous polyps or small growths are found. Your health care provider may recommend screening at an earlier age if you have risk factors for colon cancer.  Your health care provider may recommend using home test kits to check for hidden blood in the stool.  A small camera at the end of a tube can be used to examine your colon (sigmoidoscopy or colonoscopy). This checks for the earliest forms of colorectal cancer.  Prostate and Testicular Cancer  Depending on your age and overall health, your health care provider may do certain tests to screen for prostate and testicular cancer.  Talk to your health care provider about any symptoms or concerns you have about testicular or prostate cancer.  Skin Cancer  Check your skin from head to toe regularly.  Tell your health care provider about any new moles or changes in moles, especially if: ? There is a change in a mole's size, shape, or color. ? You have a mole that is larger than a pencil eraser.  Always use sunscreen. Apply sunscreen liberally and repeat throughout the day.  Protect yourself by wearing long sleeves, pants, a wide-brimmed hat, and sunglasses when outside.  What should I know about heart disease, diabetes, and high blood pressure?  If you are 104-56 years of age, have your blood pressure checked every 3-5 years. If you  are 70 years of age or older, have your blood pressure checked every year. You should have your blood pressure measured twice-once when you are at a hospital or clinic, and once when you are not at a hospital or clinic. Record the average of the two measurements. To check your blood pressure when you are not at a hospital or clinic, you can use: ? An automated blood pressure machine at a pharmacy. ? A home blood pressure monitor.  Talk to your health care provider about your target blood pressure.  If you are between  110-76 years old, ask your health care provider if you should take aspirin to prevent heart disease.  Have regular diabetes screenings by checking your fasting blood sugar level. ? If you are at a normal weight and have a low risk for diabetes, have this test once every three years after the age of 23. ? If you are overweight and have a high risk for diabetes, consider being tested at a younger age or more often.  A one-time screening for abdominal aortic aneurysm (AAA) by ultrasound is recommended for men aged 7-75 years who are current or former smokers. What should I know about preventing infection? Hepatitis B If you have a higher risk for hepatitis B, you should be screened for this virus. Talk with your health care provider to find out if you are at risk for hepatitis B infection. Hepatitis C Blood testing is recommended for:  Everyone born from [birth year] through 1963.  Anyone with known risk factors for hepatitis C.  Sexually Transmitted Diseases (STDs)  You should be screened each year for STDs including gonorrhea and chlamydia if: ? You are sexually active and are younger than 81 years of age. ? You are older than 81 years of age and your health care provider tells you that you are at risk for this type of infection. ? Your sexual activity has changed since you were last screened and you are at an increased risk for chlamydia or gonorrhea. Ask your health care provider if you are at risk.  Talk with your health care provider about whether you are at high risk of being infected with HIV. Your health care provider may recommend a prescription medicine to help prevent HIV infection.  What else can I do?  Schedule regular health, dental, and eye exams.  Stay current with your vaccines (immunizations).  Do not use any tobacco products, such as cigarettes, chewing tobacco, and e-cigarettes. If you need help quitting, ask your health care provider.  Limit alcohol intake to no more than  2 drinks per day. One drink equals 12 ounces of beer, 5 ounces of wine, or 1 ounces of hard liquor.  Do not use street drugs.  Do not share needles.  Ask your health care provider for help if you need support or information about quitting drugs.  Tell your health care provider if you often feel depressed.  Tell your health care provider if you have ever been abused or do not feel safe at home. This information is not intended to replace advice given to you by your health care provider. Make sure you discuss any questions you have with your health care provider. Document Released: 07/06/2007 Document Revised: 09/06/2015 Document Reviewed: 10/11/2014 Elsevier Interactive Patient Education  [employer].

## 2016-07-29 DIAGNOSIS — G250 Essential tremor: Secondary | ICD-10-CM | POA: Diagnosis not present

## 2016-10-18 DIAGNOSIS — H524 Presbyopia: Secondary | ICD-10-CM | POA: Diagnosis not present

## 2016-10-18 DIAGNOSIS — H26491 Other secondary cataract, right eye: Secondary | ICD-10-CM | POA: Diagnosis not present

## 2016-12-02 DIAGNOSIS — G250 Essential tremor: Secondary | ICD-10-CM | POA: Diagnosis not present

## 2017-03-19 ENCOUNTER — Other Ambulatory Visit: Payer: Self-pay | Admitting: Family Medicine

## 2017-03-31 ENCOUNTER — Encounter: Payer: Self-pay | Admitting: Internal Medicine

## 2017-03-31 ENCOUNTER — Ambulatory Visit (INDEPENDENT_AMBULATORY_CARE_PROVIDER_SITE_OTHER): Payer: Medicare Other | Admitting: Internal Medicine

## 2017-03-31 VITALS — BP 122/74 | HR 67 | Temp 97.7°F | Wt 207.0 lb

## 2017-03-31 DIAGNOSIS — N3000 Acute cystitis without hematuria: Secondary | ICD-10-CM

## 2017-03-31 DIAGNOSIS — R300 Dysuria: Secondary | ICD-10-CM | POA: Diagnosis not present

## 2017-03-31 LAB — POC URINALSYSI DIPSTICK (AUTOMATED)
Bilirubin, UA: NEGATIVE
Glucose, UA: NEGATIVE
Ketones, UA: NEGATIVE
Nitrite, UA: NEGATIVE
PH UA: 6 (ref 5.0–8.0)
Spec Grav, UA: 1.02 (ref 1.010–1.025)
UROBILINOGEN UA: 0.2 U/dL

## 2017-03-31 MED ORDER — CIPROFLOXACIN HCL 250 MG PO TABS: 250.0000 mg | ORAL_TABLET | Freq: Two times a day (BID) | ORAL | 0 refills | Status: DC

## 2017-03-31 NOTE — Patient Instructions (Signed)

## 2017-03-31 NOTE — Progress Notes (Signed)
HPI  Pt presents to the clinic today with c/o dysuria. He reports this started 2 weeks ago. He denies urgency, frequency or blood in urine. He denies fever, chills, or body aches. He has tried increasing his fluids and taking cranberry tablets OTC. He has reports some relief.    Review of Systems  Past Medical History:  Diagnosis Date  . Abnormal drug screen Aug 11, 2014   inapprop neg xanax rpt 3 mo 11-Aug-2014)  . BPH (benign prostatic hypertrophy) 01/21/98   has had 3 biopsies in past ([facility]) decided to stop PSA/DRE  . Hyperlipidemia 01/21/02  . Hypertension 05/22/03  . Left lumbar radiculopathy   . Osteoarthritis 01/22/88   knees, lumbar spondylosis and listhesis    Family History  Problem Relation Age of Onset  . Hypertension Mother   . Heart disease Brother 46       MI  . Diabetes Brother   . Stroke Neg Hx   . Cancer Daughter        melanoma    Social History   Socioeconomic History  . Marital status: Married    Spouse name: Not on file  . Number of children: Not on file  . Years of education: Not on file  . Highest education level: Not on file  Social Needs  . Financial resource strain: Not on file  . Food insecurity - worry: Not on file  . Food insecurity - inability: Not on file  . Transportation needs - medical: Not on file  . Transportation needs - non-medical: Not on file  Occupational History  . Occupation: retired Event organiser, Facilities manager, owns lawn care business  Tobacco Use  . Smoking status: Former Smoker    Packs/day: 0.00    Years: 0.00    Pack years: 0.00    Types: Pipe    Last attempt to quit: 12/26/2000    Years since quitting: 16.2  . Smokeless tobacco: Current User    Types: Chew  . Tobacco comment: quit over 30 years ago  Substance and Sexual Activity  . Alcohol use: Yes    Alcohol/week: 0.0 oz    Comment: Occasional beer  . Drug use: No  . Sexual activity: No  Other Topics Concern  . Not on file  Social History Narrative   Widowed, remarried 08-11-07.   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 Cialis [Tadalafil] Other (See Comments)    Indigestion and "felt like crap"  . Lipitor [Atorvastatin] Other (See Comments)    Severe myalgias  . Penicillins Swelling    Of tongue     Constitutional: Denies fever, malaise, fatigue, headache or abrupt weight changes.   GU: Pt reports pain with urination. Denies urgency, frequency, burning sensation, blood in urine, odor or discharge.   No other specific complaints in a complete review of systems (except as listed in HPI above).    Objective:   Physical Exam  BP 122/74   Pulse 67   Temp 97.7 F (36.5 C) (Oral)   Wt 207 lb (93.9 kg)   SpO2 98%   BMI 30.13 kg/m  Wt Readings from Last 3 Encounters:  03/31/17 207 lb (93.9 kg)  08-10-2016 206 lb (93.4 kg)  07/19/15 205 lb 12 oz (93.3 kg)    General: Appears his  stated age, in NAD. Cardiovascular: Normal rate and rhythm. S1,S2 noted.   Abdomen: Soft. Normal bowel sounds. No distention or masses noted.  Tender to palpation over the bladder area. No CVA tenderness.       Assessment & Plan:   Dysuria secondary to UTI:  Urinalysis: 2+ leuks, 1+ blood Will send urine culture eRx sent if for Cipro 250 mg BID x 7 days OK to take AZO OTC Drink plenty of fluids  RTC as needed or if symptoms persist. Webb Silversmith, NP

## 2017-04-01 LAB — URINE CULTURE
MICRO NUMBER:: 90307144
Result:: NO GROWTH
SPECIMEN QUALITY:: ADEQUATE

## 2017-04-10 DIAGNOSIS — M545 Low back pain: Secondary | ICD-10-CM | POA: Diagnosis not present

## 2017-06-17 DIAGNOSIS — G250 Essential tremor: Secondary | ICD-10-CM | POA: Diagnosis not present

## 2017-07-09 DIAGNOSIS — M5416 Radiculopathy, lumbar region: Secondary | ICD-10-CM | POA: Diagnosis not present

## 2017-07-09 DIAGNOSIS — M47816 Spondylosis without myelopathy or radiculopathy, lumbar region: Secondary | ICD-10-CM | POA: Diagnosis not present

## 2017-07-09 DIAGNOSIS — M5136 Other intervertebral disc degeneration, lumbar region: Secondary | ICD-10-CM | POA: Diagnosis not present

## 2017-07-10 DIAGNOSIS — L814 Other melanin hyperpigmentation: Secondary | ICD-10-CM | POA: Diagnosis not present

## 2017-07-10 DIAGNOSIS — L821 Other seborrheic keratosis: Secondary | ICD-10-CM | POA: Diagnosis not present

## 2017-07-10 DIAGNOSIS — L570 Actinic keratosis: Secondary | ICD-10-CM | POA: Diagnosis not present

## 2017-07-10 DIAGNOSIS — D229 Melanocytic nevi, unspecified: Secondary | ICD-10-CM | POA: Diagnosis not present

## 2017-07-10 DIAGNOSIS — D1801 Hemangioma of skin and subcutaneous tissue: Secondary | ICD-10-CM | POA: Diagnosis not present

## 2017-07-28 ENCOUNTER — Other Ambulatory Visit: Payer: Self-pay | Admitting: Family Medicine

## 2017-07-28 DIAGNOSIS — E785 Hyperlipidemia, unspecified: Secondary | ICD-10-CM

## 2017-07-28 DIAGNOSIS — N183 Chronic kidney disease, stage 3 unspecified: Secondary | ICD-10-CM

## 2017-07-28 DIAGNOSIS — E559 Vitamin D deficiency, unspecified: Secondary | ICD-10-CM

## 2017-07-29 ENCOUNTER — Ambulatory Visit: Payer: Medicare Other

## 2017-07-29 ENCOUNTER — Telehealth: Payer: Self-pay

## 2017-07-29 ENCOUNTER — Ambulatory Visit (INDEPENDENT_AMBULATORY_CARE_PROVIDER_SITE_OTHER): Payer: Medicare Other

## 2017-07-29 VITALS — BP 124/80 | HR 56 | Temp 97.8°F | Ht 70.5 in | Wt 197.5 lb

## 2017-07-29 DIAGNOSIS — R8290 Unspecified abnormal findings in urine: Secondary | ICD-10-CM | POA: Diagnosis not present

## 2017-07-29 DIAGNOSIS — E785 Hyperlipidemia, unspecified: Secondary | ICD-10-CM | POA: Diagnosis not present

## 2017-07-29 DIAGNOSIS — R309 Painful micturition, unspecified: Secondary | ICD-10-CM | POA: Diagnosis not present

## 2017-07-29 DIAGNOSIS — N183 Chronic kidney disease, stage 3 unspecified: Secondary | ICD-10-CM

## 2017-07-29 DIAGNOSIS — R350 Frequency of micturition: Secondary | ICD-10-CM | POA: Diagnosis not present

## 2017-07-29 DIAGNOSIS — E559 Vitamin D deficiency, unspecified: Secondary | ICD-10-CM | POA: Diagnosis not present

## 2017-07-29 DIAGNOSIS — R82998 Other abnormal findings in urine: Secondary | ICD-10-CM | POA: Diagnosis not present

## 2017-07-29 DIAGNOSIS — Z0000 Encounter for general adult medical examination without abnormal findings: Secondary | ICD-10-CM | POA: Diagnosis not present

## 2017-07-29 LAB — VITAMIN D 25 HYDROXY (VIT D DEFICIENCY, FRACTURES): VITD: 34.59 ng/mL (ref 30.00–100.00)

## 2017-07-29 LAB — CBC WITH DIFFERENTIAL/PLATELET
BASOS PCT: 0.4 % (ref 0.0–3.0)
Basophils Absolute: 0 10*3/uL (ref 0.0–0.1)
EOS ABS: 0.4 10*3/uL (ref 0.0–0.7)
EOS PCT: 5.5 % — AB (ref 0.0–5.0)
HCT: 39.2 % (ref 39.0–52.0)
HEMOGLOBIN: 13.8 g/dL (ref 13.0–17.0)
LYMPHS ABS: 2.3 10*3/uL (ref 0.7–4.0)
Lymphocytes Relative: 30.6 % (ref 12.0–46.0)
MCHC: 35.3 g/dL (ref 30.0–36.0)
MCV: 85.4 fl (ref 78.0–100.0)
MONO ABS: 0.4 10*3/uL (ref 0.1–1.0)
Monocytes Relative: 6 % (ref 3.0–12.0)
NEUTROS ABS: 4.3 10*3/uL (ref 1.4–7.7)
Neutrophils Relative %: 57.5 % (ref 43.0–77.0)
PLATELETS: 192 10*3/uL (ref 150.0–400.0)
RBC: 4.59 Mil/uL (ref 4.22–5.81)
RDW: 14.2 % (ref 11.5–15.5)
WBC: 7.5 10*3/uL (ref 4.0–10.5)

## 2017-07-29 LAB — LIPID PANEL
CHOL/HDL RATIO: 4
CHOLESTEROL: 184 mg/dL (ref 0–200)
HDL: 47.2 mg/dL (ref >39.00)
LDL CALC: 118 mg/dL — AB (ref 0–99)
NonHDL: 136.39
TRIGLYCERIDES: 94 mg/dL (ref 0.0–149.0)
VLDL: 18.8 mg/dL (ref 0.0–40.0)

## 2017-07-29 LAB — MICROALBUMIN / CREATININE URINE RATIO
CREATININE, U: 110.2 mg/dL
MICROALB UR: 40.5 mg/dL — AB (ref 0.0–1.9)
Microalb Creat Ratio: 36.8 mg/g — ABNORMAL HIGH (ref 0.0–30.0)

## 2017-07-29 LAB — POC URINALSYSI DIPSTICK (AUTOMATED)
Bilirubin, UA: NEGATIVE
Blood, UA: POSITIVE
GLUCOSE UA: NEGATIVE
Ketones, UA: NEGATIVE
Nitrite, UA: NEGATIVE
Protein, UA: POSITIVE — AB
SPEC GRAV UA: 1.025 (ref 1.010–1.025)
Urobilinogen, UA: 0.2 E.U./dL
pH, UA: 6 (ref 5.0–8.0)

## 2017-07-29 LAB — COMPREHENSIVE METABOLIC PANEL
ALBUMIN: 4.2 g/dL (ref 3.5–5.2)
ALT: 16 U/L (ref 0–53)
AST: 16 U/L (ref 0–37)
Alkaline Phosphatase: 46 U/L (ref 39–117)
BUN: 27 mg/dL — AB (ref 6–23)
CHLORIDE: 104 meq/L (ref 96–112)
CO2: 30 meq/L (ref 19–32)
CREATININE: 1.25 mg/dL (ref 0.40–1.50)
Calcium: 9.3 mg/dL (ref 8.4–10.5)
GFR: 58.04 mL/min — ABNORMAL LOW (ref >60.00)
Glucose, Bld: 119 mg/dL — ABNORMAL HIGH (ref 70–99)
POTASSIUM: 5.1 meq/L (ref 3.5–5.1)
SODIUM: 138 meq/L (ref 135–145)
Total Bilirubin: 0.5 mg/dL (ref 0.2–1.2)
Total Protein: 6.6 g/dL (ref 6.0–8.3)

## 2017-07-29 MED ORDER — CIPROFLOXACIN HCL 250 MG PO TABS: 250.0000 mg | ORAL_TABLET | Freq: Two times a day (BID) | ORAL | 0 refills | Status: DC

## 2017-07-29 NOTE — Progress Notes (Signed)
PCP notes:   Health maintenance:  Tetanus vaccine - postponed/insurance  Abnormal screenings:   None  Patient concerns:   Patient reports urinary frequency and stinging. PCP notified. UA ordered and completed. Abnormal findings on UA. Urine culture obtained.   Patient reported concerns with stinging on soles of both feet.   Nurse concerns:  None  Next PCP appt:   08/01/17 @ 0830

## 2017-07-29 NOTE — Telephone Encounter (Signed)
plz notify patient urine test returned suspicious for infection - urine culture was sent.  I'd like him to start taking cipro 250mg  twice daily for 1 wk sent to pharmacy.

## 2017-07-29 NOTE — Telephone Encounter (Signed)
Spoke with pt relaying message and instructions per Dr. Henriette Combs understanding.

## 2017-07-29 NOTE — Progress Notes (Signed)
Subjective:   Shawn Andrews is a 84 y.o. male who presents for Medicare Annual/Subsequent preventive examination.  Review of Systems:  N/A Cardiac Risk Factors include: advanced age (>60men, >92 women);dyslipidemia;male gender;hypertension     Objective:    Vitals: BP 124/80 (BP Location: Right Arm, Patient Position: Sitting, Cuff Size: Normal)   Pulse (!) 56   Temp 97.8 F (36.6 C) (Oral)   Ht 5' 10.5" (1.791 m) Comment: shoes  Wt 197 lb 8 oz (89.6 kg)   SpO2 98%   BMI 27.94 kg/m   Body mass index is 27.94 kg/m.  Advanced Directives 07/29/2017 07/10/2015 06/24/2013  Does Patient Have a Medical Advance Directive? Yes Yes Patient has advance directive, copy in chart  Type of Advance Directive [place];Living will [place];Living will Living will  Does patient want to make changes to medical advance directive? - No - Patient declined No change requested  Copy of [place] in Chart? Yes No - copy requested -  Pre-existing out of facility DNR order (yellow form or pink MOST form) - - No    Tobacco Social History   Tobacco Use  Smoking Status Former Smoker  . Packs/day: 0.00  . Years: 0.00  . Pack years: 0.00  . Types: Pipe  . Last attempt to quit: 12/26/2000  . Years since quitting: 16.6  Smokeless Tobacco Current User  . Types: Chew  Tobacco Comment   quit over 30 years ago     Ready to quit: No Counseling given: No Comment: quit over 30 years ago   Clinical Intake:  Pre-visit preparation completed: Yes  Pain Score: 0-No pain     Nutritional Status: BMI 25 -29 Overweight Nutritional Risks: None Diabetes: No  How often do you need to have someone help you when you read instructions, pamphlets, or other written materials from your doctor or pharmacy?: 1 - Never What is the last grade level you completed in school?: 12th grade + some college courses  Interpreter Needed?: No  Comments: pt lives with  spouse Information entered by :: LPinson, LPN  Past Medical History:  Diagnosis Date  . Abnormal drug screen 06/2014   inapprop neg xanax rpt 3 mo (06/2014)  . BPH (benign prostatic hypertrophy) 01/21/98   has had 3 biopsies in past ([facility]) decided to stop PSA/DRE  . Hyperlipidemia 01/21/02  . Hypertension 05/22/03  . Left lumbar radiculopathy   . Osteoarthritis 01/22/88   knees, lumbar spondylosis and listhesis   Past Surgical History:  Procedure Laterality Date  . CATARACT EXTRACTION  2013   bilateral  . ESI Left 08/2014, 11/2014, 08/2015   L S1, L L5/S1 transforaminal ESI; L4/5 L5/S1 zygapophysial injections, L S1 transforaminal ([facility])  . FINGER SURGERY     right middle, partial traumatic amputation  . KNEE ARTHROSCOPY  1990   right  . KNEE SURGERY  04/2006   right partial knee replacement in [place] - rec ppx abx for any invasive procedure   Family History  Problem Relation Age of Onset  . Hypertension Mother   . Heart disease Brother 76       MI  . Diabetes Brother   . Cancer Daughter        melanoma  . Stroke Neg Hx    Social History   Socioeconomic History  . Marital status: Married    Spouse name: Not on file  . Number of children: Not on file  . Years of  education: Not on file  . Highest education level: Not on file  Occupational History  . Occupation: retired Event organiser, Facilities manager, owns lawn care business  Social Needs  . Financial resource strain: Not on file  . Food insecurity:    Worry: Not on file    Inability: Not on file  . Transportation needs:    Medical: Not on file    Non-medical: Not on file  Tobacco Use  . Smoking status: Former Smoker    Packs/day: 0.00    Years: 0.00    Pack years: 0.00    Types: Pipe    Last attempt to quit: 12/26/2000    Years since quitting: 16.6  . Smokeless tobacco: Current User    Types: Chew  . Tobacco comment: quit over 30 years ago  Substance and Sexual Activity  . Alcohol use: Yes     Alcohol/week: 0.0 oz    Comment: Occasional beer  . Drug use: No  . Sexual activity: Never  Lifestyle  . Physical activity:    Days per week: Not on file    Minutes per session: Not on file  . Stress: Not on file  Relationships  . Social connections:    Talks on phone: Not on file    Gets together: Not on file    Attends religious service: Not on file    Active member of club or organization: Not on file    Attends meetings of clubs or organizations: Not on file    Relationship status: Not on file  Other Topics Concern  . Not on file  Social History Narrative   Widowed, remarried Aug 05, 2007.   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 amLODipine (NORVASC) 5 MG tablet TAKE 1 TABLET BY MOUTH  DAILY  . diazepam (VALIUM) 2 MG tablet Take 2 mg by mouth 2 (two) times daily.  Marland Kitchen doxazosin (CARDURA) 4 MG tablet TAKE 1 TABLET BY MOUTH  DAILY  . finasteride (PROSCAR) 5 MG tablet TAKE 1 TABLET BY MOUTH  DAILY  . FLAXSEED, LINSEED, PO Take 1 tablet by mouth 2 (two) times daily.   Marland Kitchen gabapentin (NEURONTIN) 100 MG capsule Take 100 mg by mouth 2 (two) times daily as needed.  . loratadine (CLARITIN) 10 MG tablet Take 1 tablet (10 mg total) by mouth daily as needed for allergies.  . metoprolol tartrate (LOPRESSOR) 50 MG tablet TAKE 1 TABLET BY MOUTH TWO  TIMES DAILY  . primidone (MYSOLINE) 50 MG tablet Take 50 mg by mouth 2 (two) times daily.   . vitamin B-12 (CYANOCOBALAMIN) 500 MCG tablet Take 500 mcg by mouth daily.  . [DISCONTINUED] ciprofloxacin (CIPRO) 250 MG tablet Take 1 tablet (250 mg total) by mouth 2 (two) times daily.   No facility-administered encounter medications on file as of 07/29/2017.     Activities of Daily Living In your present state  of health, do you have any difficulty performing the following activities: 07/29/2017  Hearing? Y  Vision? N  Difficulty concentrating or making decisions? N  Walking or climbing stairs? N  Dressing or bathing? N  Doing errands, shopping? N  Preparing Food and eating ? N  Using the Toilet? N  In the past six months, have you accidently leaked urine?  Y  Do you have problems with loss of bowel control? N  Managing your Medications? N  Managing your Finances? N  Housekeeping or managing your Housekeeping? N  Some recent data might be hidden    Patient Care Team: Ria Bush, MD as PCP - General (Family Medicine) Marry Guan, Laurice Record, MD as Consulting Physician (Orthopedic Surgery)   Assessment:   This is a routine wellness examination for Govani.  Hearing Screening Comments: Bilateral hearing aids Vision Screening Comments: Vision in July 2018    Exercise Activities and Dietary recommendations Current Exercise Habits: Home exercise routine, Type of exercise: walking;Other - see comments(stationary bike), Time (Minutes): 40, Frequency (Times/Week): 3, Weekly Exercise (Minutes/Week): 120, Intensity: Moderate, Exercise limited by: None identified  Goals    . Increase physical activity     Starting 07/29/2017, I will continue to exercise for 40 minutes 3 days per week.        Fall Risk Fall Risk  07/29/2017 07/19/2016 07/10/2015 07/05/2014 06/23/2013  Falls in the past year? No No No No No   Depression Screen PHQ 2/9 Scores 07/29/2017 07/19/2016 07/10/2015 07/05/2014  PHQ - 2 Score 0 0 0 0  PHQ- 9 Score 0 - - -    Cognitive Function MMSE - Mini Mental State Exam 07/29/2017 07/10/2015  Orientation to time 5 5  Orientation to Place 5 5  Registration 3 3  Attention/ Calculation 0 0  Recall 3 2  Recall-comments - pt was unable to recall 1 of 3 words  Language- name 2 objects 0 0  Language- repeat 1 1  Language- follow 3 step command 3 2  Language- follow 3 step command-comments - pt was  unable to follow 1 of 3 step command  Language- read & follow direction 0 0  Write a sentence 0 0  Copy design 0 0  Total score 20 18       PLEASE NOTE: A Mini-Cog screen was completed. Maximum score is 20. A value of 0 denotes this part of [place] MMSE was not completed or the patient failed this part of the Mini-Cog screening.   Mini-Cog Screening Orientation to Time - Max 5 pts Orientation to Place - Max 5 pts Registration - Max 3 pts Recall - Max 3 pts Language Repeat - Max 1 pts Language Follow 3 Step Command - Max 3 pts   Immunization History  Administered Date(s) Administered  . Influenza Split 12/27/2010, 11/14/2011  . Influenza Whole 12/22/2003, 11/02/2007, 11/15/2008  . Influenza-Unspecified 09/21/2012, 10/21/2013  . Pneumococcal Conjugate-13 06/23/2013  . Pneumococcal Polysaccharide-23 12/27/2010  . Td 02/21/2001    Screening Tests Health Maintenance  Topic Date Due  . DTaP/Tdap/Td (1 - Tdap) 07/30/2018 (Originally 02/22/2001)  . TETANUS/TDAP  07/30/2018 (Originally 02/22/2011)  . INFLUENZA VACCINE  08/21/2017  . PNA vac Low Risk Adult  Completed       Plan:     I have personally reviewed, addressed, and noted the following in the patient's chart:  A. Medical and social history B. Use of alcohol, tobacco or illicit drugs  C. Current medications and supplements D. Functional ability and status E.  Nutritional status F.  Physical activity G. Advance directives H. List of other physicians I.  Hospitalizations, surgeries, and ER visits in previous 12 months J.  [place] to include hearing, vision, cognitive, depression L. Referrals and appointments - none  In addition, I have reviewed and discussed with patient certain preventive protocols, quality metrics, and best practice recommendations. A written  personalized care plan for preventive services as well as general preventive health recommendations were provided to patient.  See attached  scanned questionnaire for additional information.   Signed,   Lindell Noe, MHA, BS, LPN Health Coach

## 2017-07-29 NOTE — Telephone Encounter (Signed)
During AWV encounter, patient verbalized concerns with urinary frequency and stinging. Advised PCP.  Per PCP, POCT UA was ordered and completed. Based on abnormal findings on UA, urine culture was obtained.

## 2017-07-29 NOTE — Patient Instructions (Signed)
Shawn Andrews , Thank you for taking time to come for your Medicare Wellness Visit. I appreciate your ongoing commitment to your health goals. Please review the following plan we discussed and let me know if I can assist you in the future.   These are the goals we discussed: Goals    . Increase physical activity     Starting 07/29/2017, I will continue to exercise for 40 minutes 3 days per week.        This is a list of the screening recommended for you and due dates:  Health Maintenance  Topic Date Due  . DTaP/Tdap/Td vaccine (1 - Tdap) 07/30/2018*  . Tetanus Vaccine  07/30/2018*  . Flu Shot  08/21/2017  . Pneumonia vaccines  Completed  *Topic was postponed. The date shown is not the original due date.   Preventive Care for Adults  A healthy lifestyle and preventive care can promote health and wellness. Preventive health guidelines for adults include the following key practices.  . A routine yearly physical is a good way to check with your health care provider about your health and preventive screening. It is a chance to share any concerns and updates on your health and to receive a thorough exam.  . Visit your dentist for a routine exam and preventive care every 6 months. Brush your teeth twice a day and floss once a day. Good oral hygiene prevents tooth decay and gum disease.  . The frequency of eye exams is based on your age, health, family medical history, use  of contact lenses, and other factors. Follow your health care provider's recommendations for frequency of eye exams.  . Eat a healthy diet. Foods like vegetables, fruits, whole grains, low-fat dairy products, and lean protein foods contain the nutrients you need without too many calories. Decrease your intake of foods high in solid fats, added sugars, and salt. Eat the right amount of calories for you. Get information about a proper diet from your health care provider, if necessary.  . Regular physical exercise is one of the  most important things you can do for your health. Most adults should get at least 150 minutes of moderate-intensity exercise (any activity that increases your heart rate and causes you to sweat) each week. In addition, most adults need muscle-strengthening exercises on 2 or more days a week.  Silver Sneakers may be a benefit available to you. To determine eligibility, you may visit the website: www.silversneakers.com or contact program at 445-607-5832 Mon-Fri between 8AM-8PM.   . Maintain a healthy weight. The body mass index (BMI) is a screening tool to identify possible weight problems. It provides an estimate of body fat based on height and weight. Your health care provider can find your BMI and can help you achieve or maintain a healthy weight.   For adults 20 years and older: ? A BMI below 18.5 is considered underweight. ? A BMI of 18.5 to 24.9 is normal. ? A BMI of 25 to 29.9 is considered overweight. ? A BMI of 30 and above is considered obese.   . Maintain normal blood lipids and cholesterol levels by exercising and minimizing your intake of saturated fat. Eat a balanced diet with plenty of fruit and vegetables. Blood tests for lipids and cholesterol should begin at age 11 and be repeated every 5 years. If your lipid or cholesterol levels are high, you are over 50, or you are at high risk for heart disease, you may need your cholesterol levels checked  more frequently. Ongoing high lipid and cholesterol levels should be treated with medicines if diet and exercise are not working.  . If you smoke, find out from your health care provider how to quit. If you do not use tobacco, please do not start.  . If you choose to drink alcohol, please do not consume more than 2 drinks per day. One drink is considered to be 12 ounces (355 mL) of beer, 5 ounces (148 mL) of wine, or 1.5 ounces (44 mL) of liquor.  . If you are 70-70 years old, ask your health care provider if you should take aspirin to  prevent strokes.  . Use sunscreen. Apply sunscreen liberally and repeatedly throughout the day. You should seek shade when your shadow is shorter than you. Protect yourself by wearing long sleeves, pants, a wide-brimmed hat, and sunglasses year round, whenever you are outdoors.  . Once a month, do a whole body skin exam, using a mirror to look at the skin on your back. Tell your health care provider of new moles, moles that have irregular borders, moles that are larger than a pencil eraser, or moles that have changed in shape or color.

## 2017-07-30 LAB — URINE CULTURE
MICRO NUMBER:: 90811300
SPECIMEN QUALITY:: ADEQUATE

## 2017-08-01 ENCOUNTER — Ambulatory Visit (INDEPENDENT_AMBULATORY_CARE_PROVIDER_SITE_OTHER): Payer: Medicare Other | Admitting: Family Medicine

## 2017-08-01 ENCOUNTER — Encounter: Payer: Self-pay | Admitting: Family Medicine

## 2017-08-01 VITALS — BP 124/72 | HR 58 | Temp 98.0°F | Ht 70.5 in | Wt 199.5 lb

## 2017-08-01 DIAGNOSIS — E559 Vitamin D deficiency, unspecified: Secondary | ICD-10-CM | POA: Diagnosis not present

## 2017-08-01 DIAGNOSIS — N183 Chronic kidney disease, stage 3 unspecified: Secondary | ICD-10-CM

## 2017-08-01 DIAGNOSIS — I10 Essential (primary) hypertension: Secondary | ICD-10-CM

## 2017-08-01 DIAGNOSIS — E785 Hyperlipidemia, unspecified: Secondary | ICD-10-CM | POA: Diagnosis not present

## 2017-08-01 DIAGNOSIS — R300 Dysuria: Secondary | ICD-10-CM

## 2017-08-01 DIAGNOSIS — H9193 Unspecified hearing loss, bilateral: Secondary | ICD-10-CM

## 2017-08-01 DIAGNOSIS — N400 Enlarged prostate without lower urinary tract symptoms: Secondary | ICD-10-CM | POA: Diagnosis not present

## 2017-08-01 DIAGNOSIS — Z0000 Encounter for general adult medical examination without abnormal findings: Secondary | ICD-10-CM | POA: Diagnosis not present

## 2017-08-01 DIAGNOSIS — R413 Other amnesia: Secondary | ICD-10-CM

## 2017-08-01 DIAGNOSIS — G250 Essential tremor: Secondary | ICD-10-CM

## 2017-08-01 NOTE — Assessment & Plan Note (Signed)
States hearing aides not very effective.

## 2017-08-01 NOTE — Assessment & Plan Note (Signed)
Chronic, stable off statin. Continue flaxseed oil. The ASCVD Risk score Mikey Bussing DC Jr., et al., 2013) failed to calculate for the following reasons:   The 2013 ASCVD risk score is only valid for ages 23 to 58

## 2017-08-01 NOTE — Assessment & Plan Note (Signed)
Stable period without concerns identified.

## 2017-08-01 NOTE — Assessment & Plan Note (Signed)
Actually improved readings over the past year. Continue to monitor. Urine microalb checked when there was blood in urine - not accurate readings.

## 2017-08-01 NOTE — Assessment & Plan Note (Signed)
UTI sxs, UA with blood and LE. However UCx grew strep viridans likely contaminant. Not consistent with prostatitis.  Recommend finish cipro 1wk course and return in 3 weeks to recheck urine ensure hematuria has resolved.

## 2017-08-01 NOTE — Assessment & Plan Note (Signed)
Preventative protocols reviewed and updated unless pt declined. Discussed healthy diet and lifestyle.  

## 2017-08-01 NOTE — Assessment & Plan Note (Signed)
Chronic, stable. Continue norvasc and metoprolol.

## 2017-08-01 NOTE — Patient Instructions (Addendum)
Urine from this week showed some blood - finish antibiotic and then return in 3 weeks for lab visit only to recheck urine test. You are doing well today Continue healthy diet and lifestyle. Return as needed or in 1 year for next physical.  Health Maintenance, Male A healthy lifestyle and preventive care is important for your health and wellness. Ask your health care provider about what schedule of regular examinations is right for you. What should I know about weight and diet? Eat a Healthy Diet  Eat plenty of vegetables, fruits, whole grains, low-fat dairy products, and lean protein.  Do not eat a lot of foods high in solid fats, added sugars, or salt.  Maintain a Healthy Weight Regular exercise can help you achieve or maintain a healthy weight. You should:  Do at least 150 minutes of exercise each week. The exercise should increase your heart rate and make you sweat (moderate-intensity exercise).  Do strength-training exercises at least twice a week.  Watch Your Levels of Cholesterol and Blood Lipids  Have your blood tested for lipids and cholesterol every 5 years starting at 82 years of age. If you are at high risk for heart disease, you should start having your blood tested when you are 82 years old. You may need to have your cholesterol levels checked more often if: ? Your lipid or cholesterol levels are high. ? You are older than 82 years of age. ? You are at high risk for heart disease.  What should I know about cancer screening? Many types of cancers can be detected early and may often be prevented. Lung Cancer  You should be screened every year for lung cancer if: ? You are a current smoker who has smoked for at least 30 years. ? You are a former smoker who has quit within the past 15 years.  Talk to your health care provider about your screening options, when you should start screening, and how often you should be screened.  Colorectal Cancer  Routine colorectal  cancer screening usually begins at 82 years of age and should be repeated every 5-10 years until you are 82 years old. You may need to be screened more often if early forms of precancerous polyps or small growths are found. Your health care provider may recommend screening at an earlier age if you have risk factors for colon cancer.  Your health care provider may recommend using home test kits to check for hidden blood in the stool.  A small camera at the end of a tube can be used to examine your colon (sigmoidoscopy or colonoscopy). This checks for the earliest forms of colorectal cancer.  Prostate and Testicular Cancer  Depending on your age and overall health, your health care provider may do certain tests to screen for prostate and testicular cancer.  Talk to your health care provider about any symptoms or concerns you have about testicular or prostate cancer.  Skin Cancer  Check your skin from head to toe regularly.  Tell your health care provider about any new moles or changes in moles, especially if: ? There is a change in a mole's size, shape, or color. ? You have a mole that is larger than a pencil eraser.  Always use sunscreen. Apply sunscreen liberally and repeat throughout the day.  Protect yourself by wearing long sleeves, pants, a wide-brimmed hat, and sunglasses when outside.  What should I know about heart disease, diabetes, and high blood pressure?  If you are 18-39 years  of age, have your blood pressure checked every 3-5 years. If you are 41 years of age or older, have your blood pressure checked every year. You should have your blood pressure measured twice-once when you are at a hospital or clinic, and once when you are not at a hospital or clinic. Record the average of the two measurements. To check your blood pressure when you are not at a hospital or clinic, you can use: ? An automated blood pressure machine at a pharmacy. ? A home blood pressure monitor.  Talk to  your health care provider about your target blood pressure.  If you are between 66-65 years old, ask your health care provider if you should take aspirin to prevent heart disease.  Have regular diabetes screenings by checking your fasting blood sugar level. ? If you are at a normal weight and have a low risk for diabetes, have this test once every three years after the age of 20. ? If you are overweight and have a high risk for diabetes, consider being tested at a younger age or more often.  A one-time screening for abdominal aortic aneurysm (AAA) by ultrasound is recommended for men aged 43-75 years who are current or former smokers. What should I know about preventing infection? Hepatitis B If you have a higher risk for hepatitis B, you should be screened for this virus. Talk with your health care provider to find out if you are at risk for hepatitis B infection. Hepatitis C Blood testing is recommended for:  Everyone born from [birth year] through 1963.  Anyone with known risk factors for hepatitis C.  Sexually Transmitted Diseases (STDs)  You should be screened each year for STDs including gonorrhea and chlamydia if: ? You are sexually active and are younger than 82 years of age. ? You are older than 82 years of age and your health care provider tells you that you are at risk for this type of infection. ? Your sexual activity has changed since you were last screened and you are at an increased risk for chlamydia or gonorrhea. Ask your health care provider if you are at risk.  Talk with your health care provider about whether you are at high risk of being infected with HIV. Your health care provider may recommend a prescription medicine to help prevent HIV infection.  What else can I do?  Schedule regular health, dental, and eye exams.  Stay current with your vaccines (immunizations).  Do not use any tobacco products, such as cigarettes, chewing tobacco, and e-cigarettes. If you need  help quitting, ask your health care provider.  Limit alcohol intake to no more than 2 drinks per day. One drink equals 12 ounces of beer, 5 ounces of wine, or 1 ounces of hard liquor.  Do not use street drugs.  Do not share needles.  Ask your health care provider for help if you need support or information about quitting drugs.  Tell your health care provider if you often feel depressed.  Tell your health care provider if you have ever been abused or do not feel safe at home. This information is not intended to replace advice given to you by your health care provider. Make sure you discuss any questions you have with your health care provider. Document Released: 07/06/2007 Document Revised: 09/06/2015 Document Reviewed: 10/11/2014 Elsevier Interactive Patient Education  [employer].

## 2017-08-01 NOTE — Progress Notes (Signed)
BP 124/72 (BP Location: Left Arm, Patient Position: Sitting, Cuff Size: Normal)   Pulse (!) 58   Temp 98 F (36.7 C) (Oral)   Ht 5' 10.5" (1.791 m)   Wt 199 lb 8 oz (90.5 kg)   SpO2 97%   BMI 28.22 kg/m    CC: CPE Subjective:    Patient ID: Shawn Andrews, male    DOB: 1928-05-08, 84 y.o.   MRN: [id number]  HPI: Shawn Andrews is a 84 y.o. male presenting on 08/01/2017 for Annual Exam (Pt 2.)   Saw Katha Cabal earlier this week for medicare wellness visit. Note reviewed.  Had dysuria, frequency, nocturia, urinalysis and UCx were sent, pt started on cipro 250mg  abx. Denies lower back or rectal pain.   Notes progressive hearing loss.  Sees neurology for essential tremor. L handed. Primodone started this year - this has significantly helped!   Preventative: Prostate screening - BPH - has had elevated PSA in past but nomal biopsies. On cardura and finasteride. Denies voiding difficulty. We decided to stop PSA/DRE checks 2015. Colon cancer screening - aged out  Flu shot yearly  Td 2003  Pneumovax 2012. Prevnar 2015  zostavax - discussed. Declines. H/o shingles in past.  shingrix - declines Advanced directives: scanned 07/2013 and in chart. Son is HCPOA Therapist, occupational [employer]). Doesn't want prolonged life support.  Seat belt use discussed.  Sunscreen use discussed. No changing moles on skin. Sees derm regularly. fmhx melanoma Ex smoker - quit 2002. Some chewing tobacco  Alcohol - seldom (1-2x a month)  Dentist - Q45mo  Eye exam - yearly   Widowed, remarried 2007-07-21. Daughter deceased from metastatic melanoma  L[facility] at [facility] Activity: walks 1.5 miles 3d/wk, [facility] cardio and weight training Diet: good water, fruits/vegetables daily   Relevant past medical, surgical, family and social history reviewed and updated as indicated. Interim medical history since our last visit reviewed. Allergies and medications reviewed and updated. Outpatient Medications Prior to  Visit  Medication Sig Dispense Refill  . amLODipine (NORVASC) 5 MG tablet TAKE 1 TABLET BY MOUTH  DAILY 90 tablet 1  . ciprofloxacin (CIPRO) 250 MG tablet Take 1 tablet (250 mg total) by mouth 2 (two) times daily. 14 tablet 0  . diazepam (VALIUM) 2 MG tablet Take 2 mg by mouth 2 (two) times daily.    Marland Kitchen doxazosin (CARDURA) 4 MG tablet TAKE 1 TABLET BY MOUTH  DAILY 90 tablet 1  . finasteride (PROSCAR) 5 MG tablet TAKE 1 TABLET BY MOUTH  DAILY 90 tablet 1  . FLAXSEED, LINSEED, PO Take 1 tablet by mouth 2 (two) times daily.     Marland Kitchen loratadine (CLARITIN) 10 MG tablet Take 1 tablet (10 mg total) by mouth daily as needed for allergies. 30 tablet   . metoprolol tartrate (LOPRESSOR) 50 MG tablet TAKE 1 TABLET BY MOUTH TWO  TIMES DAILY 180 tablet 1  . primidone (MYSOLINE) 50 MG tablet Take 50 mg by mouth 2 (two) times daily.     . vitamin B-12 (CYANOCOBALAMIN) 500 MCG tablet Take 500 mcg by mouth daily.    Marland Kitchen gabapentin (NEURONTIN) 100 MG capsule Take 100 mg by mouth 2 (two) times daily as needed.     No facility-administered medications prior to visit.      Per HPI unless specifically indicated in ROS section below Review of Systems  Constitutional: Negative for activity change, appetite change, chills, fatigue, fever and unexpected weight change.  HENT: Negative for hearing  loss.   Eyes: Negative for visual disturbance.  Respiratory: Negative for cough, chest tightness, shortness of breath and wheezing.   Cardiovascular: Negative for chest pain, palpitations and leg swelling.  Gastrointestinal: Negative for abdominal distention, abdominal pain, blood in stool, constipation, diarrhea, nausea and vomiting.  Genitourinary: Negative for difficulty urinating and hematuria.  Musculoskeletal: Negative for arthralgias, myalgias and neck pain.  Skin: Negative for rash.  Neurological: Negative for dizziness, seizures, syncope and headaches.  Hematological: Negative for adenopathy. Bruises/bleeds easily.    Psychiatric/Behavioral: Negative for dysphoric mood. The patient is not nervous/anxious.        Objective:    BP 124/72 (BP Location: Left Arm, Patient Position: Sitting, Cuff Size: Normal)   Pulse (!) 58   Temp 98 F (36.7 C) (Oral)   Ht 5' 10.5" (1.791 m)   Wt 199 lb 8 oz (90.5 kg)   SpO2 97%   BMI 28.22 kg/m   Wt Readings from Last 3 Encounters:  08/01/17 199 lb 8 oz (90.5 kg)  07/29/17 197 lb 8 oz (89.6 kg)  03/31/17 207 lb (93.9 kg)    Physical Exam  Constitutional: He is oriented to person, place, and time. He appears well-developed and well-nourished. No distress.  HENT:  Head: Normocephalic and atraumatic.  Right Ear: Hearing, tympanic membrane, external ear and ear canal normal.  Left Ear: Hearing, tympanic membrane, external ear and ear canal normal.  Nose: Nose normal.  Mouth/Throat: Uvula is midline, oropharynx is clear and moist and mucous membranes are normal. No oropharyngeal exudate, posterior oropharyngeal edema or posterior oropharyngeal erythema.  Cerumen in ear canals bilaterally  Eyes: Pupils are equal, round, and reactive to light. Conjunctivae and EOM are normal. No scleral icterus.  Neck: Normal range of motion. Neck supple. Carotid bruit is not present. No thyromegaly present.  Cardiovascular: Normal rate, regular rhythm, normal heart sounds and intact distal pulses.  No murmur heard. Pulses:      Radial pulses are 2+ on the right side, and 2+ on the left side.  Pulmonary/Chest: Effort normal and breath sounds normal. No respiratory distress. He has no wheezes. He has no rales.  Abdominal: Soft. He exhibits no distension and no mass. Bowel sounds are increased. There is no tenderness. There is no rebound and no guarding.  Musculoskeletal: Normal range of motion. He exhibits no edema.  Lymphadenopathy:    He has no cervical adenopathy.  Neurological: He is alert and oriented to person, place, and time.  CN grossly intact, station and gait intact   Skin: Skin is warm and dry. No rash noted.  Psychiatric: He has a normal mood and affect. His behavior is normal. Judgment and thought content normal.  Nursing note and vitals reviewed.  Results for orders placed or performed in visit on 07/29/17  Urine Culture  Result Value Ref Range   MICRO NUMBER: [id number]    SPECIMEN QUALITY: ADEQUATE    Sample Source URINE    STATUS: FINAL    ISOLATE 1: Streptococcus, viridans group (A)   Microalbumin / creatinine urine ratio  Result Value Ref Range   Microalb, Ur 40.5 (H) 0.0 - 1.9 mg/dL   Creatinine,U 110.2 mg/dL   Microalb Creat Ratio 36.8 (H) 0.0 - 30.0 mg/g  VITAMIN D 25 Hydroxy (Vit-D Deficiency, Fractures)  Result Value Ref Range   VITD 34.59 30.00 - 100.00 ng/mL  CBC with Differential/Platelet  Result Value Ref Range   WBC 7.5 4.0 - 10.5 K/uL   RBC 4.59 4.22 - 5.81  Mil/uL   Hemoglobin 13.8 13.0 - 17.0 g/dL   HCT 39.2 39.0 - 52.0 %   MCV 85.4 78.0 - 100.0 fl   MCHC 35.3 30.0 - 36.0 g/dL   RDW 14.2 11.5 - 15.5 %   Platelets 192.0 150.0 - 400.0 K/uL   Neutrophils Relative % 57.5 43.0 - 77.0 %   Lymphocytes Relative 30.6 12.0 - 46.0 %   Monocytes Relative 6.0 3.0 - 12.0 %   Eosinophils Relative 5.5 (H) 0.0 - 5.0 %   Basophils Relative 0.4 0.0 - 3.0 %   Neutro Abs 4.3 1.4 - 7.7 K/uL   Lymphs Abs 2.3 0.7 - 4.0 K/uL   Monocytes Absolute 0.4 0.1 - 1.0 K/uL   Eosinophils Absolute 0.4 0.0 - 0.7 K/uL   Basophils Absolute 0.0 0.0 - 0.1 K/uL  Comprehensive metabolic panel  Result Value Ref Range   Sodium 138 135 - 145 mEq/L   Potassium 5.1 3.5 - 5.1 mEq/L   Chloride 104 96 - 112 mEq/L   CO2 30 19 - 32 mEq/L   Glucose, Bld 119 (H) 70 - 99 mg/dL   BUN 27 (H) 6 - 23 mg/dL   Creatinine, Ser 1.25 0.40 - 1.50 mg/dL   Total Bilirubin 0.5 0.2 - 1.2 mg/dL   Alkaline Phosphatase 46 39 - 117 U/L   AST 16 0 - 37 U/L   ALT 16 0 - 53 U/L   Total Protein 6.6 6.0 - 8.3 g/dL   Albumin 4.2 3.5 - 5.2 g/dL   Calcium 9.3 8.4 - 10.5 mg/dL   GFR  58.04 (L) >60.00 mL/min  Lipid panel  Result Value Ref Range   Cholesterol 184 0 - 200 mg/dL   Triglycerides 94.0 0.0 - 149.0 mg/dL   HDL 47.20 >39.00 mg/dL   VLDL 18.8 0.0 - 40.0 mg/dL   LDL Cholesterol 118 (H) 0 - 99 mg/dL   Total CHOL/HDL Ratio 4    NonHDL 136.39   POCT Urinalysis Dipstick (Automated)  Result Value Ref Range   Color, UA amber    Clarity, UA cloudy    Glucose, UA Negative Negative   Bilirubin, UA negative    Ketones, UA negative    Spec Grav, UA 1.025 1.010 - 1.025   Blood, UA positive    pH, UA 6.0 5.0 - 8.0   Protein, UA Positive (A) Negative   Urobilinogen, UA 0.2 0.2 or 1.0 E.U./dL   Nitrite, UA negative    Leukocytes, UA Large (3+) (A) Negative      Assessment & Plan:   Problem List Items Addressed This Visit    Vitamin D deficiency    Stable readings - off replacement.       Memory deficit    Stable period without concerns identified.       HLD (hyperlipidemia)    Chronic, stable off statin. Continue flaxseed oil. The ASCVD Risk score Mikey Bussing DC Jr., et al., 2013) failed to calculate for the following reasons:   The 2013 ASCVD risk score is only valid for ages 2 to 20       High frequency hearing loss    States hearing aides not very effective.       Health maintenance examination - Primary    Preventative protocols reviewed and updated unless pt declined. Discussed healthy diet and lifestyle.       Essential tremor    Marked improvement on primidone - appreciate neuro care.  Continues valium 2mg  bid.  Essential hypertension    Chronic, stable. Continue norvasc and metoprolol.       Dysuria    UTI sxs, UA with blood and LE. However UCx grew strep viridans likely contaminant. Not consistent with prostatitis.  Recommend finish cipro 1wk course and return in 3 weeks to recheck urine ensure hematuria has resolved.       CKD (chronic kidney disease) stage 3, GFR 30-59 ml/min (HCC)    Actually improved readings over the past  year. Continue to monitor. Urine microalb checked when there was blood in urine - not accurate readings.       Benign prostatic hyperplasia    Continue proscar and alpha blocker          No orders of the defined types were placed in this encounter.  No orders of the defined types were placed in this encounter.   Follow up plan: Return in about 1 year (around 08/02/2018).  Ria Bush, MD

## 2017-08-01 NOTE — Assessment & Plan Note (Signed)
Continue proscar and alpha blocker

## 2017-08-01 NOTE — Assessment & Plan Note (Addendum)
Marked improvement on primidone - appreciate neuro care.  Continues valium 2mg  bid.

## 2017-08-01 NOTE — Assessment & Plan Note (Signed)
Stable readings - off replacement.

## 2017-08-04 NOTE — Progress Notes (Signed)
During AWV encounter, patient verbalized concerns with urinary frequency and stinging.   POCT UA - abnormal. UCx sent.   cipro 250mg  BID x 1 wk started.

## 2017-08-04 NOTE — Progress Notes (Signed)
I reviewed health advisor's note, was available for consultation, and agree with documentation and plan.  

## 2017-08-20 ENCOUNTER — Other Ambulatory Visit: Payer: Self-pay | Admitting: Family Medicine

## 2017-08-20 DIAGNOSIS — R319 Hematuria, unspecified: Secondary | ICD-10-CM

## 2017-08-22 ENCOUNTER — Other Ambulatory Visit (INDEPENDENT_AMBULATORY_CARE_PROVIDER_SITE_OTHER): Payer: Medicare Other

## 2017-08-22 DIAGNOSIS — R319 Hematuria, unspecified: Secondary | ICD-10-CM

## 2017-08-22 LAB — URINALYSIS, ROUTINE W REFLEX MICROSCOPIC
BILIRUBIN URINE: NEGATIVE
HGB URINE DIPSTICK: NEGATIVE
KETONES UR: NEGATIVE
LEUKOCYTES UA: NEGATIVE
NITRITE: NEGATIVE
Specific Gravity, Urine: 1.025 (ref 1.000–1.030)
Urine Glucose: NEGATIVE
Urobilinogen, UA: 0.2 (ref 0.0–1.0)
pH: 5.5 (ref 5.0–8.0)

## 2017-09-16 DIAGNOSIS — D485 Neoplasm of uncertain behavior of skin: Secondary | ICD-10-CM | POA: Diagnosis not present

## 2017-09-16 DIAGNOSIS — L578 Other skin changes due to chronic exposure to nonionizing radiation: Secondary | ICD-10-CM | POA: Diagnosis not present

## 2017-09-16 DIAGNOSIS — C44729 Squamous cell carcinoma of skin of left lower limb, including hip: Secondary | ICD-10-CM | POA: Diagnosis not present

## 2017-09-16 DIAGNOSIS — B079 Viral wart, unspecified: Secondary | ICD-10-CM | POA: Diagnosis not present

## 2017-10-21 DIAGNOSIS — C44729 Squamous cell carcinoma of skin of left lower limb, including hip: Secondary | ICD-10-CM | POA: Diagnosis not present

## 2017-10-21 DIAGNOSIS — L989 Disorder of the skin and subcutaneous tissue, unspecified: Secondary | ICD-10-CM | POA: Diagnosis not present

## 2017-10-23 DIAGNOSIS — Z961 Presence of intraocular lens: Secondary | ICD-10-CM | POA: Diagnosis not present

## 2017-10-23 DIAGNOSIS — H52203 Unspecified astigmatism, bilateral: Secondary | ICD-10-CM | POA: Diagnosis not present

## 2017-10-23 DIAGNOSIS — H26491 Other secondary cataract, right eye: Secondary | ICD-10-CM | POA: Diagnosis not present

## 2017-11-10 ENCOUNTER — Emergency Department: Payer: Medicare Other

## 2017-11-10 ENCOUNTER — Emergency Department
Admission: EM | Admit: 2017-11-10 | Discharge: 2017-11-10 | Disposition: A | Discharge status: Home / Self Care | Payer: Medicare Other | Attending: Emergency Medicine | Admitting: Emergency Medicine

## 2017-11-10 ENCOUNTER — Encounter: Payer: Self-pay | Admitting: Emergency Medicine

## 2017-11-10 DIAGNOSIS — N183 Chronic kidney disease, stage 3 (moderate): Secondary | ICD-10-CM | POA: Diagnosis not present

## 2017-11-10 DIAGNOSIS — Z79899 Other long term (current) drug therapy: Secondary | ICD-10-CM | POA: Diagnosis not present

## 2017-11-10 DIAGNOSIS — R079 Chest pain, unspecified: Secondary | ICD-10-CM | POA: Insufficient documentation

## 2017-11-10 DIAGNOSIS — R0789 Other chest pain: Secondary | ICD-10-CM | POA: Diagnosis not present

## 2017-11-10 DIAGNOSIS — R7989 Other specified abnormal findings of blood chemistry: Secondary | ICD-10-CM | POA: Diagnosis not present

## 2017-11-10 DIAGNOSIS — I129 Hypertensive chronic kidney disease with stage 1 through stage 4 chronic kidney disease, or unspecified chronic kidney disease: Secondary | ICD-10-CM | POA: Insufficient documentation

## 2017-11-10 DIAGNOSIS — Z87891 Personal history of nicotine dependence: Secondary | ICD-10-CM | POA: Insufficient documentation

## 2017-11-10 LAB — BASIC METABOLIC PANEL
ANION GAP: 7 (ref 5–15)
BUN: 28 mg/dL — ABNORMAL HIGH (ref 8–23)
CALCIUM: 9.2 mg/dL (ref 8.9–10.3)
CO2: 28 mmol/L (ref 22–32)
Chloride: 106 mmol/L (ref 98–111)
Creatinine, Ser: 1.23 mg/dL (ref 0.61–1.24)
GFR, EST AFRICAN AMERICAN: 59 mL/min — AB (ref >60)
GFR, EST NON AFRICAN AMERICAN: 51 mL/min — AB (ref >60)
Glucose, Bld: 130 mg/dL — ABNORMAL HIGH (ref 70–99)
Potassium: 4.1 mmol/L (ref 3.5–5.1)
SODIUM: 141 mmol/L (ref 135–145)

## 2017-11-10 LAB — TROPONIN I

## 2017-11-10 LAB — CBC
HCT: 38.5 % — ABNORMAL LOW (ref 39.0–52.0)
Hemoglobin: 13.4 g/dL (ref 13.0–17.0)
MCH: 30.2 pg (ref 26.0–34.0)
MCHC: 34.8 g/dL (ref 30.0–36.0)
MCV: 86.7 fL (ref 80.0–100.0)
NRBC: 0 % (ref 0.0–0.2)
Platelets: 182 10*3/uL (ref 150–400)
RBC: 4.44 MIL/uL (ref 4.22–5.81)
RDW: 12.7 % (ref 11.5–15.5)
WBC: 7.9 10*3/uL (ref 4.0–10.5)

## 2017-11-10 LAB — FIBRIN DERIVATIVES D-DIMER (ARMC ONLY): Fibrin derivatives D-dimer (ARMC): 550.12 ng/mL (FEU) — ABNORMAL HIGH (ref 0.00–499.00)

## 2017-11-10 MED ORDER — IOPAMIDOL (ISOVUE-370) INJECTION 76%
75.0000 mL | Freq: Once | INTRAVENOUS | Status: AC | PRN
  Administered 2017-11-10: 75 mL via INTRAVENOUS

## 2017-11-10 NOTE — ED Notes (Signed)
Patient transported to Ultrasound 

## 2017-11-10 NOTE — ED Triage Notes (Signed)
Patient with complaint of central chest pain that woke him out of sleep at 04:30 this morning. Patient denies nausea, vomiting and shortness of breath.

## 2017-11-10 NOTE — ED Notes (Signed)
Contacted lab about delay in lab results and asked to have the d-dimer test added.

## 2017-11-10 NOTE — ED Provider Notes (Signed)
 -----------------------------------------   10:15 AM on 11/10/2017 -----------------------------------------  Assumed care from Dr. Owens Shark awaiting for labs in 2 troponins with plan to discharge home if work-up was reassuring.  D-dimer did come back elevated so CT scan was obtained.  Received a call from radiology who notes that there is a lot of motion artifact they do not see a definitive PE and would recommend a lower extremity duplex to further risk stratify since anticoagulation can be very risky in an 84 year old.  If negative, would defer anticoagulation until follow-up and potential repeat imaging in the future.  ----------------------------------------- 12:57 PM on 11/10/2017 -----------------------------------------  Lower extremity duplex negative for acute DVT.  Patient informed of findings, recommend follow-up with his primary care doctor and cardiology this week.   Carrie Mew, MD 11/10/17 1257

## 2017-11-10 NOTE — Discharge Instructions (Addendum)
Your tests today in the ER were all okay.  Radiology reported that you may have a small blood clot in your lung but nothing definitive or narrating treatment with blood thinners at this time.  You should follow-up with a cardiologist for further assessment.  If you have persistent symptoms, you may need repeat the testing in the future to look for blood clots.  The ultrasounds of your legs did not show any acute blood clots.

## 2017-11-10 NOTE — ED Notes (Signed)
Pt returned from CT °

## 2017-11-10 NOTE — ED Provider Notes (Signed)
[facility] Emergency Department Provider Note   First MD Initiated Contact with Patient 11/10/17 [phone]     (approximate)  I have reviewed the triage vital signs and the nursing notes.   HISTORY  Chief Complaint Chest Pain    HPI Shawn Andrews is a 84 y.o. male with below list of chronic medical conditions including hypertension hyperlipidemia presents emergency department acute onset of intermittent sharp left-sided chest discomfort that is nonradiating that awoke the patient from sleep this morning at 4:30 AM.  Patient denies any pain at present.  Patient denies any accompanying symptoms namely no looks of any pain swelling dyspnea diaphoresis nausea or vomiting.   Past Medical History:  Diagnosis Date  . Abnormal drug screen 06/2014   inapprop neg xanax rpt 3 mo (06/2014)  . BPH (benign prostatic hypertrophy) 01/21/98   has had 3 biopsies in past ([facility]) decided to stop PSA/DRE  . Hyperlipidemia 01/21/02  . Hypertension 05/22/03  . Left lumbar radiculopathy   . Osteoarthritis 01/22/88   knees, lumbar spondylosis and listhesis    Patient Active Problem List   Diagnosis Date Noted  . Dysuria 08/01/2017  . Skin lesion 07/18/2016  . Health maintenance examination 07/19/2015  . Advanced care planning/counseling discussion 07/05/2014  . Memory deficit 07/05/2014  . CKD (chronic kidney disease) stage 3, GFR 30-59 ml/min (HCC) 06/23/2013  . Post-nasal drainage 12/22/2012  . Medicare annual wellness visit, subsequent 06/15/2012  . Lumbosacral radiculopathy at L5 06/17/2011  . High frequency hearing loss 10/18/2010  . Vitamin D deficiency 11/15/2008  . 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 Essential hypertension 05/22/2003  . HLD (hyperlipidemia) 01/21/2002  . Essential tremor 01/22/2000  . ELEVATED PROSTATE SPECIFIC ANTIGEN 01/22/2000  . Benign prostatic hyperplasia 01/21/1998  . OSTEOARTHRITIS 01/22/1988    Past Surgical History:  Procedure Laterality  Date  . CATARACT EXTRACTION  2013   bilateral  . ESI Left 08/2014, 11/2014, 08/2015   L S1, L L5/S1 transforaminal ESI; L4/5 L5/S1 zygapophysial injections, L S1 transforaminal ([facility])  . FINGER SURGERY     right middle, partial traumatic amputation  . KNEE ARTHROSCOPY  1990   right  . KNEE SURGERY  04/2006   right partial knee replacement in [place] - rec ppx abx for any invasive procedure    Prior to Admission medications   Medication Sig Start Date End Date Taking? Authorizing Provider  amLODipine (NORVASC) 5 MG tablet TAKE 1 TABLET BY MOUTH  DAILY 08/20/17   Ria Bush, MD  ciprofloxacin (CIPRO) 250 MG tablet Take 1 tablet (250 mg total) by mouth 2 (two) times daily. 07/29/17   Ria Bush, MD  diazepam (VALIUM) 2 MG tablet Take 2 mg by mouth 2 (two) times daily.    [provider]  doxazosin (CARDURA) 4 MG tablet TAKE 1 TABLET BY MOUTH  DAILY 08/20/17   Ria Bush, MD  finasteride (PROSCAR) 5 MG tablet TAKE 1 TABLET BY MOUTH  DAILY 08/20/17   Ria Bush, MD  FLAXSEED, LINSEED, PO Take 1 tablet by mouth 2 (two) times daily.     [provider]  loratadine (CLARITIN) 10 MG tablet Take 1 tablet (10 mg total) by mouth daily as needed for allergies. 12/22/12   Ria Bush, MD  metoprolol tartrate (LOPRESSOR) 50 MG tablet TAKE 1 TABLET BY MOUTH TWO  TIMES DAILY 08/20/17   Ria Bush, MD  primidone (MYSOLINE) 50 MG tablet Take 50 mg by mouth 2 (two) times daily.  06/23/17   [provider]  vitamin B-12 (CYANOCOBALAMIN) 500 MCG tablet Take 500 mcg by mouth daily.    [provider]    Allergies Cialis [tadalafil]; Lipitor [atorvastatin]; and Penicillins  Family History  Problem Relation Age of Onset  . Hypertension Mother   . Heart disease Brother 12       MI  . Diabetes Brother   . Cancer Daughter        melanoma  . Stroke Neg Hx     Social History Social History   Tobacco Use  . Smoking status: Former  Smoker    Packs/day: 0.00    Years: 0.00    Pack years: 0.00    Types: Pipe    Last attempt to quit: 12/26/2000    Years since quitting: 16.8  . Smokeless tobacco: Current User    Types: Chew  . Tobacco comment: quit over 30 years ago  Substance Use Topics  . Alcohol use: Yes    Alcohol/week: 0.0 standard drinks    Comment: Occasional beer  . Drug use: No    Review of Systems Constitutional: No fever/chills Eyes: No visual changes. ENT: No sore throat. Cardiovascular: Denies chest pain. Respiratory: Denies shortness of breath. Gastrointestinal: No abdominal pain.  No nausea, no vomiting.  No diarrhea.  No constipation. Genitourinary: Negative for dysuria. Musculoskeletal: Negative for neck pain.  Negative for back pain. Integumentary: Negative for rash. Neurological: Negative for headaches, focal weakness or numbness.  ____________________________________________   PHYSICAL EXAM:  VITAL SIGNS: ED Triage Vitals  Enc Vitals Group     BP 11/10/17 0543 (!) 171/83     Pulse Rate 11/10/17 0543 64     Resp 11/10/17 0543 (!) 22     Temp 11/10/17 0543 97.8 F (36.6 C)     Temp Source 11/10/17 0543 Oral     SpO2 11/10/17 0543 96 %     Weight 11/10/17 0541 88.5 kg (195 lb)     Height 11/10/17 0541 1.778 m (5\' 10" )     Head Circumference --      Peak Flow --      Pain Score 11/10/17 0540 2     Pain Loc --      Pain Edu? --      Excl. in [place]? --     Constitutional: Alert and oriented. Well appearing and in no acute distress. Eyes: Conjunctivae are normal. Head: Atraumatic. Mouth/Throat: Mucous membranes are moist.  Oropharynx non-erythematous. Neck: No stridor.  Cardiovascular: Normal rate, regular rhythm. Good peripheral circulation. Grossly normal heart sounds. Respiratory: Normal respiratory effort.  No retractions. Lungs CTAB. Gastrointestinal: Soft and nontender. No distention.  Musculoskeletal: No lower extremity tenderness nor edema. No gross deformities of  extremities. Neurologic:  Normal speech and language. No gross focal neurologic deficits are appreciated.  Skin:  Skin is warm, dry and intact. No rash noted. Psychiatric: Mood and affect are normal. Speech and behavior are normal.  ____________________________________________   LABS (all labs ordered are listed, but only abnormal results are displayed)  Labs Reviewed  CBC - Abnormal; Notable for the following components:      Result Value   HCT 38.5 (*)    All other components within normal limits  BASIC METABOLIC PANEL  TROPONIN I   ____________________________________________  EKG  ED ECG REPORT I, [place] Delauder, the attending physician, personally viewed and interpreted this ECG.   Date: 11/10/2017  EKG Time: 5:43 AM  Rate: 63  Rhythm: Normal sinus rhythm  Axis: Normal  Intervals: Normal  ST&T Change: None  ____________________________________________  RADIOLOGY I, [place] Shaneal Barasch, personally viewed and evaluated these images (plain radiographs) as part of my medical decision making, as well as reviewing the written report by the radiologist.  ED MD interpretation: No active disease noted on chest x-ray per radiologist  Official radiology report(s): Dg Chest Port 1 View  Result Date: 11/10/2017 CLINICAL DATA:  Chest pain EXAM: PORTABLE CHEST 1 VIEW COMPARISON:  None. FINDINGS: The heart size and mediastinal contours are within normal limits. Both lungs are clear. The visualized skeletal structures are unremarkable. IMPRESSION: No active disease. Electronically Signed   By: Ulyses Jarred M.D.   On: 11/10/2017 06:13      Procedures   ____________________________________________   INITIAL IMPRESSION / ASSESSMENT AND PLAN / ED COURSE  As part of my medical decision making, I reviewed the following data within the electronic MEDICAL RECORD NUMBER   84 year old male presented with above-stated history and physical exam of nonradiating left-sided chest pain.   Concern for possible ACS and as such EKG was performed which revealed no evidence of ischemia or infarction.  Troponin pending at this time.  Also considered possibility of PE however this patient would be low risk hence d-dimer will be obtained.  Patient's care transferred to Dr. Joni Fears ____________________________________________  FINAL CLINICAL IMPRESSION(S) / ED DIAGNOSES  Final diagnoses:  Chest pain, unspecified type     MEDICATIONS GIVEN DURING THIS VISIT:  Medications - No data to display   ED Discharge Orders    None       Note:  This document was prepared using Dragon voice recognition software and may include unintentional dictation errors.    Gregor Hams, MD 11/10/17 [phone]

## 2018-01-23 DIAGNOSIS — R05 Cough: Secondary | ICD-10-CM | POA: Diagnosis not present

## 2018-01-23 DIAGNOSIS — I10 Essential (primary) hypertension: Secondary | ICD-10-CM | POA: Diagnosis not present

## 2018-06-24 ENCOUNTER — Encounter: Payer: Self-pay | Admitting: Family Medicine

## 2018-06-24 ENCOUNTER — Ambulatory Visit (INDEPENDENT_AMBULATORY_CARE_PROVIDER_SITE_OTHER): Payer: Medicare Other | Admitting: Family Medicine

## 2018-06-24 VITALS — BP 135/76 | HR 54 | Temp 97.6°F | Ht 70.5 in | Wt 190.0 lb

## 2018-06-24 DIAGNOSIS — I10 Essential (primary) hypertension: Secondary | ICD-10-CM

## 2018-06-24 MED ORDER — ACETAMINOPHEN ER 650 MG PO TBCR: 650.0000 mg | EXTENDED_RELEASE_TABLET | Freq: Two times a day (BID) | ORAL | Status: DC

## 2018-06-24 MED ORDER — AMLODIPINE BESYLATE 10 MG PO TABS: 10.0000 mg | ORAL_TABLET | Freq: Every day | ORAL | 0 refills | Status: DC

## 2018-06-24 NOTE — Progress Notes (Signed)
Virtual visit completed through [employer]. Due to national recommendations of social distancing due to COVID-19, a virtual visit is felt to be most appropriate for this patient at this time. Reviewed limitations of a virtual visit.   Patient location: home, wife also on call with him Provider location: [place] at [facility], office If any vitals were documented, they were collected by patient at home unless specified below.    BP 135/76 (Patient Position: Sitting)   Pulse (!) 54   Temp 97.6 F (36.4 C) (Oral)   Ht 5' 10.5" (1.791 m)   Wt 190 lb (86.2 kg)   BMI 26.88 kg/m    CC: elevated blood pressures Subjective:    Patient ID: Shawn Andrews, male    DOB: [birth year], 85 y.o.   MRN: [id number]  HPI: OSAZE Shawn Andrews is a 85 y.o. male presenting on 06/24/2018 for Elevated BP (C/o having elevated BP readings, 180s/80s.  Says when he was able to workout 130s/60s.)   HTN - Compliant with current antihypertensive regimen of amlodipine 5mg  daily, doxazosin 4mg  daily, metoprolol 50mg  bid. Does check blood pressures at home: 186/86 - elevated recently over the last several weeks. Has not been able to return to the gym like he used to. Denies low blood pressure readings or symptoms of dizziness/syncope. Denies HA, CP/tightness, SOB, leg swelling. 155/80 HR 57 today after walking, after resting dropped to 139/76 HR 54.   Over the past week BP running 180s (except today). No diet changes recently.  Less aerobic exercise than normal.   Having sciatic nerve pain which could be contributing. He is taking tylenol arthritis BID.      Relevant past medical, surgical, family and social history reviewed and updated as indicated. Interim medical history since our last visit reviewed. Allergies and medications reviewed and updated. Outpatient Medications Prior to Visit  Medication Sig Dispense Refill  . doxazosin (CARDURA) 4 MG tablet TAKE 1 TABLET BY MOUTH  DAILY (Patient taking differently: Take 4 mg  by mouth daily. ) 90 tablet 3  . finasteride (PROSCAR) 5 MG tablet TAKE 1 TABLET BY MOUTH  DAILY (Patient taking differently: Take 5 mg by mouth daily. ) 90 tablet 3  . FLAXSEED, LINSEED, PO Take 1 tablet by mouth 2 (two) times daily.     Marland Kitchen loratadine (CLARITIN) 10 MG tablet Take 1 tablet (10 mg total) by mouth daily as needed for allergies. 30 tablet   . metoprolol tartrate (LOPRESSOR) 50 MG tablet TAKE 1 TABLET BY MOUTH TWO  TIMES DAILY (Patient taking differently: Take 50 mg by mouth 2 (two) times daily. ) 180 tablet 3  . primidone (MYSOLINE) 50 MG tablet Take 50 mg by mouth 2 (two) times daily.     . vitamin B-12 (CYANOCOBALAMIN) 500 MCG tablet Take 500 mcg by mouth daily.    Marland Kitchen amLODipine (NORVASC) 5 MG tablet TAKE 1 TABLET BY MOUTH  DAILY (Patient taking differently: Take 5 mg by mouth daily. ) 90 tablet 3  . ciprofloxacin (CIPRO) 250 MG tablet Take 1 tablet (250 mg total) by mouth 2 (two) times daily. 14 tablet 0   No facility-administered medications prior to visit.      Per HPI unless specifically indicated in ROS section below Review of Systems Objective:    BP 135/76 (Patient Position: Sitting)   Pulse (!) 54   Temp 97.6 F (36.4 C) (Oral)   Ht 5' 10.5" (1.791 m)   Wt 190 lb (86.2 kg)   BMI 26.88  kg/m   Wt Readings from Last 3 Encounters:  06/24/18 190 lb (86.2 kg)  11/10/17 195 lb (88.5 kg)  08/01/17 199 lb 8 oz (90.5 kg)     Physical exam: Gen: alert, NAD, not ill appearing Pulm: speaks in complete sentences without increased work of breathing Psych: normal mood, normal thought content      Results for orders placed or performed during the hospital encounter of 22/29/79  Basic metabolic panel  Result Value Ref Range   Sodium 141 135 - 145 mmol/L   Potassium 4.1 3.5 - 5.1 mmol/L   Chloride 106 98 - 111 mmol/L   CO2 28 22 - 32 mmol/L   Glucose, Bld 130 (H) 70 - 99 mg/dL   BUN 28 (H) 8 - 23 mg/dL   Creatinine, Ser 1.23 0.61 - 1.24 mg/dL   Calcium 9.2 8.9 - 10.3  mg/dL   GFR calc non Af Amer 51 (L) >60 mL/min   GFR calc Af Amer 59 (L) >60 mL/min   Anion gap 7 5 - 15  CBC  Result Value Ref Range   WBC 7.9 4.0 - 10.5 K/uL   RBC 4.44 4.22 - 5.81 MIL/uL   Hemoglobin 13.4 13.0 - 17.0 g/dL   HCT 38.5 (L) 39.0 - 52.0 %   MCV 86.7 80.0 - 100.0 fL   MCH 30.2 26.0 - 34.0 pg   MCHC 34.8 30.0 - 36.0 g/dL   RDW 12.7 11.5 - 15.5 %   Platelets 182 150 - 400 K/uL   nRBC 0.0 0.0 - 0.2 %  Troponin I  Result Value Ref Range   Troponin I <0.03 <0.03 ng/mL  Fibrin derivatives D-Dimer  Result Value Ref Range   Fibrin derivatives D-dimer ([facility]) 550.12 (H) 0.00 - 499.00 ng/mL (FEU)  Troponin I  Result Value Ref Range   Troponin I <0.03 <0.03 ng/mL   Assessment & Plan:   Problem List Items Addressed This Visit    Essential hypertension - Primary    Elevated readings recently despite compliance with medications and endorsed limiting salt/sodium in diet. Anticipate due to decreased aerobic exercise with gyms closed. Will increase amlodipine to 10mg  daily and he will monitor BP at home, reviewed watching for side effects of amlodipine especially ankle edema. Reassess at f/u visit.       Relevant Medications   amLODipine (NORVASC) 10 MG tablet       Meds ordered this encounter  Medications  . acetaminophen (TYLENOL 8 HOUR) 650 MG CR tablet    Sig: Take 1 tablet (650 mg total) by mouth 2 (two) times a day.  Marland Kitchen amLODipine (NORVASC) 10 MG tablet    Sig: Take 1 tablet (10 mg total) by mouth daily.    Dispense:  90 tablet    Refill:  0   No orders of the defined types were placed in this encounter.   I discussed the assessment and treatment plan with the patient. The patient was provided an opportunity to ask questions and all were answered. The patient agreed with the plan and demonstrated an understanding of the instructions. The patient was advised to call back or seek an in-person evaluation if the symptoms worsen or if the condition fails to improve as  anticipated.  Follow up plan: No follow-ups on file.  Ria Bush, MD

## 2018-06-24 NOTE — Assessment & Plan Note (Signed)
Elevated readings recently despite compliance with medications and endorsed limiting salt/sodium in diet. Anticipate due to decreased aerobic exercise with gyms closed. Will increase amlodipine to 10mg  daily and he will monitor BP at home, reviewed watching for side effects of amlodipine especially ankle edema. Reassess at f/u visit.

## 2018-06-25 ENCOUNTER — Other Ambulatory Visit: Payer: Self-pay | Admitting: Family Medicine

## 2018-07-10 ENCOUNTER — Telehealth: Payer: Self-pay | Admitting: Family Medicine

## 2018-07-10 NOTE — Telephone Encounter (Signed)
Placed in Dr. G's box.  

## 2018-07-10 NOTE — Telephone Encounter (Signed)
Patient came by the office and dropped off his blood pressure readings.  The envelope is in the rx tower.

## 2018-07-12 NOTE — Telephone Encounter (Signed)
Reviewed BPs - 120-150s/60-70s, HR 60-70s.  plz notify - BPs running better with increase in amlodipine. Continue 10mg  daily as long as tolerating well.

## 2018-07-13 NOTE — Telephone Encounter (Signed)
I spoke pt's wife. She did not receive the vm at this time but I read Dr Synthia Innocent note and she understood. She will call back if there is any swelling or changes. Also, she will continue to monitor BP and call if any numbers are alarming.

## 2018-07-13 NOTE — Telephone Encounter (Signed)
Per DPR left VM letting pt know Dr. Synthia Innocent comments

## 2018-08-12 ENCOUNTER — Other Ambulatory Visit: Payer: Self-pay | Admitting: Family Medicine

## 2018-08-15 ENCOUNTER — Other Ambulatory Visit: Payer: Self-pay | Admitting: Family Medicine

## 2018-08-15 DIAGNOSIS — N183 Chronic kidney disease, stage 3 unspecified: Secondary | ICD-10-CM

## 2018-08-15 DIAGNOSIS — E559 Vitamin D deficiency, unspecified: Secondary | ICD-10-CM

## 2018-08-15 DIAGNOSIS — E785 Hyperlipidemia, unspecified: Secondary | ICD-10-CM

## 2018-08-18 ENCOUNTER — Other Ambulatory Visit (INDEPENDENT_AMBULATORY_CARE_PROVIDER_SITE_OTHER): Payer: Medicare Other

## 2018-08-18 ENCOUNTER — Other Ambulatory Visit: Payer: Self-pay

## 2018-08-18 ENCOUNTER — Ambulatory Visit: Payer: Medicare Other

## 2018-08-18 DIAGNOSIS — N183 Chronic kidney disease, stage 3 unspecified: Secondary | ICD-10-CM

## 2018-08-18 DIAGNOSIS — E559 Vitamin D deficiency, unspecified: Secondary | ICD-10-CM

## 2018-08-18 DIAGNOSIS — E785 Hyperlipidemia, unspecified: Secondary | ICD-10-CM | POA: Diagnosis not present

## 2018-08-18 LAB — CBC WITH DIFFERENTIAL/PLATELET
Basophils Absolute: 0 10*3/uL (ref 0.0–0.1)
Basophils Relative: 0.5 % (ref 0.0–3.0)
Eosinophils Absolute: 0.3 10*3/uL (ref 0.0–0.7)
Eosinophils Relative: 4.9 % (ref 0.0–5.0)
HCT: 37.8 % — ABNORMAL LOW (ref 39.0–52.0)
Hemoglobin: 13 g/dL (ref 13.0–17.0)
Lymphocytes Relative: 29.5 % (ref 12.0–46.0)
Lymphs Abs: 1.9 10*3/uL (ref 0.7–4.0)
MCHC: 34.4 g/dL (ref 30.0–36.0)
MCV: 87.5 fl (ref 78.0–100.0)
Monocytes Absolute: 0.5 10*3/uL (ref 0.1–1.0)
Monocytes Relative: 7.4 % (ref 3.0–12.0)
Neutro Abs: 3.7 10*3/uL (ref 1.4–7.7)
Neutrophils Relative %: 57.7 % (ref 43.0–77.0)
Platelets: 199 10*3/uL (ref 150.0–400.0)
RBC: 4.32 Mil/uL (ref 4.22–5.81)
RDW: 13.9 % (ref 11.5–15.5)
WBC: 6.3 10*3/uL (ref 4.0–10.5)

## 2018-08-18 LAB — COMPREHENSIVE METABOLIC PANEL
ALT: 11 U/L (ref 0–53)
AST: 14 U/L (ref 0–37)
Albumin: 4.1 g/dL (ref 3.5–5.2)
Alkaline Phosphatase: 54 U/L (ref 39–117)
BUN: 29 mg/dL — ABNORMAL HIGH (ref 6–23)
CO2: 29 mEq/L (ref 19–32)
Calcium: 9.1 mg/dL (ref 8.4–10.5)
Chloride: 105 mEq/L (ref 96–112)
Creatinine, Ser: 1.29 mg/dL (ref 0.40–1.50)
GFR: 52.53 mL/min — ABNORMAL LOW (ref >60.00)
Glucose, Bld: 113 mg/dL — ABNORMAL HIGH (ref 70–99)
Potassium: 4.6 mEq/L (ref 3.5–5.1)
Sodium: 139 mEq/L (ref 135–145)
Total Bilirubin: 0.5 mg/dL (ref 0.2–1.2)
Total Protein: 5.8 g/dL — ABNORMAL LOW (ref 6.0–8.3)

## 2018-08-18 LAB — VITAMIN D 25 HYDROXY (VIT D DEFICIENCY, FRACTURES): VITD: 29.14 ng/mL — ABNORMAL LOW (ref 30.00–100.00)

## 2018-08-18 LAB — LIPID PANEL
Cholesterol: 190 mg/dL (ref 0–200)
HDL: 43.5 mg/dL (ref >39.00)
LDL Cholesterol: 129 mg/dL — ABNORMAL HIGH (ref 0–99)
NonHDL: 146.98
Total CHOL/HDL Ratio: 4
Triglycerides: 88 mg/dL (ref 0.0–149.0)
VLDL: 17.6 mg/dL (ref 0.0–40.0)

## 2018-08-25 ENCOUNTER — Ambulatory Visit (INDEPENDENT_AMBULATORY_CARE_PROVIDER_SITE_OTHER): Payer: Medicare Other | Admitting: Family Medicine

## 2018-08-25 ENCOUNTER — Encounter: Payer: Self-pay | Admitting: Family Medicine

## 2018-08-25 ENCOUNTER — Other Ambulatory Visit: Payer: Self-pay

## 2018-08-25 VITALS — BP 156/70 | HR 58 | Temp 98.2°F | Ht 69.25 in | Wt 197.4 lb

## 2018-08-25 DIAGNOSIS — Z0000 Encounter for general adult medical examination without abnormal findings: Secondary | ICD-10-CM

## 2018-08-25 DIAGNOSIS — H9193 Unspecified hearing loss, bilateral: Secondary | ICD-10-CM

## 2018-08-25 DIAGNOSIS — N400 Enlarged prostate without lower urinary tract symptoms: Secondary | ICD-10-CM

## 2018-08-25 DIAGNOSIS — R413 Other amnesia: Secondary | ICD-10-CM

## 2018-08-25 DIAGNOSIS — G250 Essential tremor: Secondary | ICD-10-CM

## 2018-08-25 DIAGNOSIS — E559 Vitamin D deficiency, unspecified: Secondary | ICD-10-CM

## 2018-08-25 DIAGNOSIS — E785 Hyperlipidemia, unspecified: Secondary | ICD-10-CM

## 2018-08-25 DIAGNOSIS — N183 Chronic kidney disease, stage 3 unspecified: Secondary | ICD-10-CM

## 2018-08-25 DIAGNOSIS — I10 Essential (primary) hypertension: Secondary | ICD-10-CM

## 2018-08-25 MED ORDER — VITAMIN D3 25 MCG (1000 UT) PO CAPS: 1.0000 | ORAL_CAPSULE | Freq: Every day | ORAL | Status: DC

## 2018-08-25 NOTE — Patient Instructions (Addendum)
Start vitamin D 1000 units daily over the counter. Increase water intake to keep kidneys hydrated.  Continue watching blood pressures, return in 2-3 months for follow up visit on this.   Health Maintenance After Age 83 After age 9, you are at a higher risk for certain long-term diseases and infections as well as injuries from falls. Falls are a major cause of broken bones and head injuries in people who are older than age 55. Getting regular preventive care can help to keep you healthy and well. Preventive care includes getting regular testing and making lifestyle changes as recommended by your health care provider. Talk with your health care provider about:  Which screenings and tests you should have. A screening is a test that checks for a disease when you have no symptoms.  A diet and exercise plan that is right for you. What should I know about screenings and tests to prevent falls? Screening and testing are the best ways to find a health problem early. Early diagnosis and treatment give you the best chance of managing medical conditions that are common after age 73. Certain conditions and lifestyle choices may make you more likely to have a fall. Your health care provider may recommend:  Regular vision checks. Poor vision and conditions such as cataracts can make you more likely to have a fall. If you wear glasses, make sure to get your prescription updated if your vision changes.  Medicine review. Work with your health care provider to regularly review all of the medicines you are taking, including over-the-counter medicines. Ask your health care provider about any side effects that may make you more likely to have a fall. Tell your health care provider if any medicines that you take make you feel dizzy or sleepy.  Osteoporosis screening. Osteoporosis is a condition that causes the bones to get weaker. This can make the bones weak and cause them to break more easily.  Blood pressure  screening. Blood pressure changes and medicines to control blood pressure can make you feel dizzy.  Strength and balance checks. Your health care provider may recommend certain tests to check your strength and balance while standing, walking, or changing positions.  Foot health exam. Foot pain and numbness, as well as not wearing proper footwear, can make you more likely to have a fall.  Depression screening. You may be more likely to have a fall if you have a fear of falling, feel emotionally low, or feel unable to do activities that you used to do.  Alcohol use screening. Using too much alcohol can affect your balance and may make you more likely to have a fall. What actions can I take to lower my risk of falls? General instructions  Talk with your health care provider about your risks for falling. Tell your health care provider if: ? You fall. Be sure to tell your health care provider about all falls, even ones that seem minor. ? You feel dizzy, sleepy, or off-balance.  Take over-the-counter and prescription medicines only as told by your health care provider. These include any supplements.  Eat a healthy diet and maintain a healthy weight. A healthy diet includes low-fat dairy products, low-fat (lean) meats, and fiber from whole grains, beans, and lots of fruits and vegetables. Home safety  Remove any tripping hazards, such as rugs, cords, and clutter.  Install safety equipment such as grab bars in bathrooms and safety rails on stairs.  Keep rooms and walkways well-lit. Activity   Follow a  regular exercise program to stay fit. This will help you maintain your balance. Ask your health care provider what types of exercise are appropriate for you.  If you need a cane or walker, use it as recommended by your health care provider.  Wear supportive shoes that have nonskid soles. Lifestyle  Do not drink alcohol if your health care provider tells you not to drink.  If you drink  alcohol, limit how much you have: ? 0-1 drink a day for women. ? 0-2 drinks a day for men.  Be aware of how much alcohol is in your drink. In the [place] one drink equals one typical bottle of beer (12 oz), one-half glass of wine (5 oz), or one shot of hard liquor (1 oz).  Do not use any products that contain nicotine or tobacco, such as cigarettes and e-cigarettes. If you need help quitting, ask your health care provider. Summary  Having a healthy lifestyle and getting preventive care can help to protect your health and wellness after age 72.  Screening and testing are the best way to find a health problem early and help you avoid having a fall. Early diagnosis and treatment give you the best chance for managing medical conditions that are more common for people who are older than age 14.  Falls are a major cause of broken bones and head injuries in people who are older than age 60. Take precautions to prevent a fall at home.  Work with your health care provider to learn what changes you can make to improve your health and wellness and to prevent falls. This information is not intended to replace advice given to you by your health care provider. Make sure you discuss any questions you have with your health care provider. Document Released: 11/20/2016 Document Revised: 04/30/2018 Document Reviewed: 11/20/2016 Elsevier Patient Education  2020 [employer].

## 2018-08-25 NOTE — Progress Notes (Signed)
This visit was conducted in person.  BP (!) 156/70 (BP Location: Left Arm, Patient Position: Sitting, Cuff Size: Normal)   Pulse (!) 58   Temp 98.2 F (36.8 C) (Temporal)   Ht 5' 9.25" (1.759 m)   Wt 197 lb 6 oz (89.5 kg)   SpO2 97%   BMI 28.94 kg/m   On repeat, 160/74  CC: AMW Subjective:    Patient ID: Shawn Andrews, male    DOB: 21-Mar-1928, 85 y.o.   MRN: [id number]  HPI: Shawn Andrews is a 85 y.o. male presenting on 08/25/2018 for Medicare Wellness   Did not see health coach this year.    Hearing Screening   125Hz  250Hz  500Hz  1000Hz  2000Hz  3000Hz  4000Hz  6000Hz  8000Hz   Right ear:   40 0 40  0    Left ear:   40 0 40  0    Comments: Has bilateral hearing aids but does not wear them  Vision Screening Comments: Last eye exam, 10/2017    Office Visit from 08/25/2018 in [place] at [facility]  PHQ-2 Total Score  0    States hearing aides don't help - will return to audiologist.  Fall Risk  08/25/2018 07/29/2017 07/19/2016 07/10/2015 07/05/2014  Falls in the past year? 0 No No No No     Seen 07/21/2018 with increasing blood pressures so we increased amlodipine to 10mg  daily - ongoing elevated readings in office but at home running better.   Sees neurology for essential tremor. L handed. Primodone started 2019 - this has significantly helped.   Ongoing L hip and back pain - has seen surgeon for this.   Preventative: Prostate screening - BPH - has had elevated PSA in past but nomal biopsies. On cardura and finasteride. Denies voiding difficulty. We decided to stop PSA/DRE checks 2015. Colon cancer screening - aged out  Flu shot yearly  Td 2003  Pneumovax 2012. Prevnar 2015  zostavax - discussed. Declines. H/o shingles in past. shingrix - declines Advanced directives: scanned 07/2013 and in chart. Son is HCPOA Therapist, occupational [employer]). Doesn't want prolonged life support.  Seat belt use discussed. Sunscreen use discussed. No changing moles on skin.Sees derm regularly. Fmhx  melanoma.  Ex smoker - quit 2002. Some chewing tobacco  Alcohol - seldom (1-2x a month)  Dentist - Q27mo  Eye exam - yearly  Bowel - no constipation Bladder - no incontinence  Widowed, remarried 2007/07/21. Daughter deceased from metastatic melanoma  L[facility] at [facility] Activity: walks 1.5 miles 3d/wk, [facility] cardio and weight training Diet: good water, fruits/vegetables daily      Relevant past medical, surgical, family and social history reviewed and updated as indicated. Interim medical history since our last visit reviewed. Allergies and medications reviewed and updated. Outpatient Medications Prior to Visit  Medication Sig Dispense Refill  . acetaminophen (TYLENOL 8 HOUR) 650 MG CR tablet Take 1 tablet (650 mg total) by mouth 2 (two) times a day.    Marland Kitchen amLODipine (NORVASC) 10 MG tablet TAKE 1 TABLET BY MOUTH  DAILY 90 tablet 0  . doxazosin (CARDURA) 4 MG tablet TAKE 1 TABLET BY MOUTH  DAILY 90 tablet 1  . finasteride (PROSCAR) 5 MG tablet TAKE 1 TABLET BY MOUTH  DAILY 90 tablet 1  . FLAXSEED, LINSEED, PO Take 1 tablet by mouth 2 (two) times daily.     Marland Kitchen loratadine (CLARITIN) 10 MG tablet Take 1 tablet (10 mg total) by mouth daily as needed for  allergies. 30 tablet   . metoprolol tartrate (LOPRESSOR) 50 MG tablet TAKE 1 TABLET BY MOUTH TWO  TIMES DAILY 180 tablet 1  . primidone (MYSOLINE) 50 MG tablet Take 50 mg by mouth 2 (two) times daily.     . vitamin B-12 (CYANOCOBALAMIN) 500 MCG tablet Take 500 mcg by mouth daily.     No facility-administered medications prior to visit.      Per HPI unless specifically indicated in ROS section below Review of Systems  Constitutional: Negative for activity change, appetite change, chills, fatigue, fever and unexpected weight change.  HENT: Negative for hearing loss.   Eyes: Negative for visual disturbance.  Respiratory: Negative for cough, chest tightness, shortness of breath and wheezing.   Cardiovascular:  Negative for chest pain, palpitations and leg swelling.  Gastrointestinal: Negative for abdominal distention, abdominal pain, blood in stool, constipation, diarrhea, nausea and vomiting.  Genitourinary: Negative for difficulty urinating and hematuria.  Musculoskeletal: Negative for arthralgias, myalgias and neck pain.  Skin: Negative for rash.  Neurological: Negative for dizziness, seizures, syncope and headaches.  Hematological: Negative for adenopathy. Does not bruise/bleed easily.  Psychiatric/Behavioral: Negative for dysphoric mood. The patient is not nervous/anxious.    Objective:    BP (!) 156/70 (BP Location: Left Arm, Patient Position: Sitting, Cuff Size: Normal)   Pulse (!) 58   Temp 98.2 F (36.8 C) (Temporal)   Ht 5' 9.25" (1.759 m)   Wt 197 lb 6 oz (89.5 kg)   SpO2 97%   BMI 28.94 kg/m   Wt Readings from Last 3 Encounters:  08/25/18 197 lb 6 oz (89.5 kg)  06/24/18 190 lb (86.2 kg)  11/10/17 195 lb (88.5 kg)    Physical Exam Vitals signs and nursing note reviewed.  Constitutional:      General: He is not in acute distress.    Appearance: Normal appearance. He is well-developed. He is not ill-appearing.  HENT:     Head: Normocephalic and atraumatic.     Right Ear: Hearing, tympanic membrane, ear canal and external ear normal.     Left Ear: Hearing, tympanic membrane, ear canal and external ear normal.     Nose: Nose normal.     Mouth/Throat:     Mouth: Mucous membranes are moist.     Pharynx: Uvula midline. No oropharyngeal exudate or posterior oropharyngeal erythema.  Eyes:     General: No scleral icterus.    Extraocular Movements: Extraocular movements intact.     Conjunctiva/sclera: Conjunctivae normal.     Pupils: Pupils are equal, round, and reactive to light.  Neck:     Musculoskeletal: Normal range of motion and neck supple.     Vascular: No carotid bruit.  Cardiovascular:     Rate and Rhythm: Normal rate and regular rhythm.     Pulses: Normal pulses.           Radial pulses are 2+ on the right side and 2+ on the left side.     Heart sounds: Normal heart sounds. No murmur.  Pulmonary:     Effort: Pulmonary effort is normal. No respiratory distress.     Breath sounds: Normal breath sounds. No wheezing, rhonchi or rales.  Abdominal:     General: Abdomen is flat. Bowel sounds are normal. There is no distension.     Palpations: Abdomen is soft. There is no mass.     Tenderness: There is no abdominal tenderness. There is no guarding or rebound.     Hernia: No hernia is  present.  Musculoskeletal: Normal range of motion.     Right lower leg: No edema.     Left lower leg: No edema.  Lymphadenopathy:     Cervical: No cervical adenopathy.  Skin:    General: Skin is warm and dry.     Findings: No rash.  Neurological:     General: No focal deficit present.     Mental Status: He is alert and oriented to person, place, and time.     Comments:  Tremor present L>R hands.  CN grossly intact, station and gait intact Doesn't recall president  Recall 2/3, 2/3 with cue Calculation 3/5 D-L-[id number]  Psychiatric:        Mood and Affect: Mood normal.        Behavior: Behavior normal.        Thought Content: Thought content normal.        Judgment: Judgment normal.       Results for orders placed or performed in visit on 08/18/18  CBC with Differential/Platelet  Result Value Ref Range   WBC 6.3 4.0 - 10.5 K/uL   RBC 4.32 4.22 - 5.81 Mil/uL   Hemoglobin 13.0 13.0 - 17.0 g/dL   HCT 37.8 (L) 39.0 - 52.0 %   MCV 87.5 78.0 - 100.0 fl   MCHC 34.4 30.0 - 36.0 g/dL   RDW 13.9 11.5 - 15.5 %   Platelets 199.0 150.0 - 400.0 K/uL   Neutrophils Relative % 57.7 43.0 - 77.0 %   Lymphocytes Relative 29.5 12.0 - 46.0 %   Monocytes Relative 7.4 3.0 - 12.0 %   Eosinophils Relative 4.9 0.0 - 5.0 %   Basophils Relative 0.5 0.0 - 3.0 %   Neutro Abs 3.7 1.4 - 7.7 K/uL   Lymphs Abs 1.9 0.7 - 4.0 K/uL   Monocytes Absolute 0.5 0.1 - 1.0 K/uL   Eosinophils Absolute  0.3 0.0 - 0.7 K/uL   Basophils Absolute 0.0 0.0 - 0.1 K/uL  VITAMIN D 25 Hydroxy (Vit-D Deficiency, Fractures)  Result Value Ref Range   VITD 29.14 (L) 30.00 - 100.00 ng/mL  Lipid panel  Result Value Ref Range   Cholesterol 190 0 - 200 mg/dL   Triglycerides 88.0 0.0 - 149.0 mg/dL   HDL 43.50 >39.00 mg/dL   VLDL 17.6 0.0 - 40.0 mg/dL   LDL Cholesterol 129 (H) 0 - 99 mg/dL   Total CHOL/HDL Ratio 4    NonHDL 146.98   Comprehensive metabolic panel  Result Value Ref Range   Sodium 139 135 - 145 mEq/L   Potassium 4.6 3.5 - 5.1 mEq/L   Chloride 105 96 - 112 mEq/L   CO2 29 19 - 32 mEq/L   Glucose, Bld 113 (H) 70 - 99 mg/dL   BUN 29 (H) 6 - 23 mg/dL   Creatinine, Ser 1.29 0.40 - 1.50 mg/dL   Total Bilirubin 0.5 0.2 - 1.2 mg/dL   Alkaline Phosphatase 54 39 - 117 U/L   AST 14 0 - 37 U/L   ALT 11 0 - 53 U/L   Total Protein 5.8 (L) 6.0 - 8.3 g/dL   Albumin 4.1 3.5 - 5.2 g/dL   Calcium 9.1 8.4 - 10.5 mg/dL   GFR 52.53 (L) >60.00 mL/min   Assessment & Plan:   Problem List Items Addressed This Visit    Vitamin D deficiency    rec start 1000 IU daily.       Memory deficit    Again noted today with trouble with  recall and calculation and identification of president. Will recommend formal MMSE at f/u visit. Pt denies memory concerns.       Medicare annual wellness visit, subsequent    I have personally reviewed the Medicare Annual Wellness questionnaire and have noted 1. The patient's medical and social history 2. Their use of alcohol, tobacco or illicit drugs 3. Their current medications and supplements 4. The patient's functional ability including ADL's, fall risks, home safety risks and hearing or visual impairment. Cognitive function has been assessed and addressed as indicated.  5. Diet and physical activity 6. Evidence for depression or mood disorders The patients weight, height, BMI have been recorded in the chart. I have made referrals, counseling and provided education to  the patient based on review of the above and I have provided the pt with a written personalized care plan for preventive services. Provider list updated.. See scanned questionairre as needed for further documentation. Reviewed preventative protocols and updated unless pt declined.       HLD (hyperlipidemia)    Chronic, stable off statin. Will not start at this time.  The ASCVD Risk score Mikey Bussing DC Jr., et al., 2013) failed to calculate for the following reasons:   The 2013 ASCVD risk score is only valid for ages 67 to 63       High frequency hearing loss - Primary    Planning to return to audiologist due to poor functioning hearing aides.       Health maintenance examination    Preventative protocols reviewed and updated unless pt declined. Discussed healthy diet and lifestyle.       Essential tremor    Stable period on primidone by neurology       Essential hypertension    Chronic, again elevated today. Encouraged increased water intake. Pt declines starting additional medication at this time. Will return in 2-3 months for BP check. H/o ACEI induced hyperkalemia.       CKD (chronic kidney disease) stage 3, GFR 30-59 ml/min (HCC)    Again reviewed CKD diagnosis. Encouraged increased water intake - he does not tend to do well with this. Already avoids NSAIDs.       Benign prostatic hyperplasia    Stable period on proscar/doxazosin. Sleeps the night through. Continue doxazosin given elevated blood pressures.           Meds ordered this encounter  Medications  . Cholecalciferol (VITAMIN D3) 25 MCG (1000 UT) CAPS    Sig: Take 1 capsule (1,000 Units total) by mouth daily.    Dispense:  30 capsule   No orders of the defined types were placed in this encounter.  Patient instructions: Start vitamin D 1000 units daily over the counter. Increase water intake to keep kidneys hydrated.  Continue watching blood pressures, return in 2-3 months for follow up visit on this.    Follow up plan: Return in about 3 months (around 11/25/2018) for follow up visit.  Ria Bush, MD

## 2018-08-25 NOTE — Assessment & Plan Note (Addendum)
Planning to return to audiologist due to poor functioning hearing aides.

## 2018-08-25 NOTE — Assessment & Plan Note (Signed)
Preventative protocols reviewed and updated unless pt declined. Discussed healthy diet and lifestyle.  

## 2018-08-25 NOTE — Assessment & Plan Note (Signed)
Chronic, stable off statin. Will not start at this time.  The ASCVD Risk score Shawn Bussing DC Jr., Shawn al., Shawn Andrews) failed to calculate for the following reasons:   The Shawn Andrews ASCVD risk score is only valid for ages 88 to 33

## 2018-08-25 NOTE — Assessment & Plan Note (Addendum)
Stable period on proscar/doxazosin. Sleeps the night through. Continue doxazosin given elevated blood pressures.

## 2018-08-25 NOTE — Assessment & Plan Note (Signed)

## 2018-08-25 NOTE — Assessment & Plan Note (Signed)
Chronic, again elevated today. Encouraged increased water intake. Pt declines starting additional medication at this time. Will return in 2-3 months for BP check. H/o ACEI induced hyperkalemia.

## 2018-08-25 NOTE — Assessment & Plan Note (Signed)
Stable period on primidone by neurology

## 2018-08-25 NOTE — Assessment & Plan Note (Addendum)
Again noted today with trouble with recall and calculation and identification of president. Will recommend formal MMSE at f/u visit. Pt denies memory concerns.

## 2018-08-25 NOTE — Assessment & Plan Note (Signed)
rec start 1000 IU daily.  

## 2018-08-25 NOTE — Assessment & Plan Note (Signed)
Again reviewed CKD diagnosis. Encouraged increased water intake - he does not tend to do well with this. Already avoids NSAIDs.

## 2018-09-24 DIAGNOSIS — D1801 Hemangioma of skin and subcutaneous tissue: Secondary | ICD-10-CM | POA: Diagnosis not present

## 2018-09-24 DIAGNOSIS — L819 Disorder of pigmentation, unspecified: Secondary | ICD-10-CM | POA: Diagnosis not present

## 2018-09-24 DIAGNOSIS — L570 Actinic keratosis: Secondary | ICD-10-CM | POA: Diagnosis not present

## 2018-09-24 DIAGNOSIS — L821 Other seborrheic keratosis: Secondary | ICD-10-CM | POA: Diagnosis not present

## 2018-09-24 DIAGNOSIS — C44722 Squamous cell carcinoma of skin of right lower limb, including hip: Secondary | ICD-10-CM | POA: Diagnosis not present

## 2018-09-24 DIAGNOSIS — L989 Disorder of the skin and subcutaneous tissue, unspecified: Secondary | ICD-10-CM | POA: Diagnosis not present

## 2018-09-24 DIAGNOSIS — D229 Melanocytic nevi, unspecified: Secondary | ICD-10-CM | POA: Diagnosis not present

## 2018-09-24 DIAGNOSIS — L280 Lichen simplex chronicus: Secondary | ICD-10-CM | POA: Diagnosis not present

## 2018-09-24 DIAGNOSIS — D485 Neoplasm of uncertain behavior of skin: Secondary | ICD-10-CM | POA: Diagnosis not present

## 2018-10-26 DIAGNOSIS — H02834 Dermatochalasis of left upper eyelid: Secondary | ICD-10-CM | POA: Diagnosis not present

## 2018-10-26 DIAGNOSIS — H26491 Other secondary cataract, right eye: Secondary | ICD-10-CM | POA: Diagnosis not present

## 2018-10-26 DIAGNOSIS — H52203 Unspecified astigmatism, bilateral: Secondary | ICD-10-CM | POA: Diagnosis not present

## 2018-10-26 DIAGNOSIS — H02831 Dermatochalasis of right upper eyelid: Secondary | ICD-10-CM | POA: Diagnosis not present

## 2018-10-27 ENCOUNTER — Other Ambulatory Visit: Payer: Self-pay | Admitting: Family Medicine

## 2018-10-27 DIAGNOSIS — C44629 Squamous cell carcinoma of skin of left upper limb, including shoulder: Secondary | ICD-10-CM | POA: Diagnosis not present

## 2018-10-27 DIAGNOSIS — C44722 Squamous cell carcinoma of skin of right lower limb, including hip: Secondary | ICD-10-CM | POA: Diagnosis not present

## 2018-11-10 DIAGNOSIS — L700 Acne vulgaris: Secondary | ICD-10-CM | POA: Diagnosis not present

## 2018-11-23 DIAGNOSIS — R251 Tremor, unspecified: Secondary | ICD-10-CM | POA: Diagnosis not present

## 2018-11-27 ENCOUNTER — Encounter: Payer: Self-pay | Admitting: Family Medicine

## 2018-11-27 ENCOUNTER — Other Ambulatory Visit: Payer: Self-pay

## 2018-11-27 ENCOUNTER — Ambulatory Visit (INDEPENDENT_AMBULATORY_CARE_PROVIDER_SITE_OTHER): Payer: Medicare Other | Admitting: Family Medicine

## 2018-11-27 DIAGNOSIS — I10 Essential (primary) hypertension: Secondary | ICD-10-CM | POA: Diagnosis not present

## 2018-11-27 DIAGNOSIS — G250 Essential tremor: Secondary | ICD-10-CM

## 2018-11-27 DIAGNOSIS — H9193 Unspecified hearing loss, bilateral: Secondary | ICD-10-CM

## 2018-11-27 DIAGNOSIS — R413 Other amnesia: Secondary | ICD-10-CM | POA: Diagnosis not present

## 2018-11-27 NOTE — Progress Notes (Signed)
This visit was conducted in person.  BP 132/80 (BP Location: Left Arm, Patient Position: Sitting, Cuff Size: Normal)   Pulse 60   Temp 98.2 F (36.8 C) (Temporal)   Ht 5' 9.25" (1.759 m)   Wt 196 lb 8 oz (89.1 kg)   SpO2 97%   BMI 28.81 kg/m    CC: 3 mo f/u visit Subjective:    Patient ID: Shawn Andrews, male    DOB: [birth year], 85 y.o.   MRN: [id number]  HPI: Shawn Andrews is a 85 y.o. male presenting on 11/27/2018 for Hypertension (Here for 3 mo f/u.)   Lives at home with wife.   New hearing aides - this has significantly helped his hearing.  HTN - Compliant with current antihypertensive regimen of amlodipine 10mg  daily, cardura 4mg  daily, metoprolol 50mg  bid. Does check blood pressures at home: [id number] systolic. Started walking 1 mile 3x/wk. No low blood pressure readings or symptoms of dizziness/syncope.  Denies HA, vision changes, CP/tightness, SOB, leg swelling.    Essential tremor - on primodone started 2019 followed by neurology Melrose Nakayama).   Geriatric Assessment: Activities of Daily Living:     Bathing- independent    Dressing- independent    Eating- independent    Toileting- independent     Transferring- independent    Continence- independent Overall Assessment: independent  No incontinence, no constipation  Instrumental Activities of Daily Living:     Transportation- independent    Meal/Food Preparation- independent    Shopping Errands- independent    Housekeeping/Chores- independent    Money Management/Finances- dependent - wife manages this     Medication Management- dependent - wife manages this     Ability to Use Telephone- independent    Laundry- dependent - wife manages this  Overall Assessment: largely independent  Mental Status Exam: 24/30 (value/max value)     Clock Drawing Score: 2/4     Relevant past medical, surgical, family and social history reviewed and updated as indicated. Interim medical history since our last visit reviewed.  Allergies and medications reviewed and updated. Outpatient Medications Prior to Visit  Medication Sig Dispense Refill  . acetaminophen (TYLENOL 8 HOUR) 650 MG CR tablet Take 1 tablet (650 mg total) by mouth 2 (two) times a day.    . ALPRAZolam (XANAX) 0.25 MG tablet Take 1 tablet by mouth daily.    Marland Kitchen amLODipine (NORVASC) 10 MG tablet TAKE 1 TABLET BY MOUTH  DAILY 90 tablet 3  . Cholecalciferol (VITAMIN D3) 25 MCG (1000 UT) CAPS Take 1 capsule (1,000 Units total) by mouth daily. 30 capsule   . doxazosin (CARDURA) 4 MG tablet TAKE 1 TABLET BY MOUTH  DAILY 90 tablet 3  . finasteride (PROSCAR) 5 MG tablet TAKE 1 TABLET BY MOUTH  DAILY 90 tablet 3  . FLAXSEED, LINSEED, PO Take 1 tablet by mouth 2 (two) times daily.     Marland Kitchen loratadine (CLARITIN) 10 MG tablet Take 1 tablet (10 mg total) by mouth daily as needed for allergies. 30 tablet   . metoprolol tartrate (LOPRESSOR) 50 MG tablet TAKE 1 TABLET BY MOUTH  TWICE DAILY 180 tablet 3  . primidone (MYSOLINE) 50 MG tablet Take 50 mg by mouth 2 (two) times daily.     . TOLAK 4 % CREA Apply 1 application topically daily. Apply 1 small amount to affected area once daily.  Apply to arms from elbows down to back of hands and on biopsy site on right lower leg    .  vitamin B-12 (CYANOCOBALAMIN) 500 MCG tablet Take 500 mcg by mouth daily.     No facility-administered medications prior to visit.      Per HPI unless specifically indicated in ROS section below Review of Systems Objective:    BP 132/80 (BP Location: Left Arm, Patient Position: Sitting, Cuff Size: Normal)   Pulse 60   Temp 98.2 F (36.8 C) (Temporal)   Ht 5' 9.25" (1.759 m)   Wt 196 lb 8 oz (89.1 kg)   SpO2 97%   BMI 28.81 kg/m   Wt Readings from Last 3 Encounters:  11/27/18 196 lb 8 oz (89.1 kg)  08/25/18 197 lb 6 oz (89.5 kg)  06/24/18 190 lb (86.2 kg)    Physical Exam Vitals signs and nursing note reviewed.  Constitutional:      Appearance: Normal appearance. He is normal weight.  He is not ill-appearing.  HENT:     Right Ear: Decreased hearing noted.     Left Ear: Decreased hearing noted.     Ears:     Comments: Wears hearing aides Neurological:     Mental Status: He is alert.  Psychiatric:        Mood and Affect: Mood normal.        Behavior: Behavior normal.       Results for orders placed or performed in visit on 08/18/18  CBC with Differential/Platelet  Result Value Ref Range   WBC 6.3 4.0 - 10.5 K/uL   RBC 4.32 4.22 - 5.81 Mil/uL   Hemoglobin 13.0 13.0 - 17.0 g/dL   HCT 37.8 (L) 39.0 - 52.0 %   MCV 87.5 78.0 - 100.0 fl   MCHC 34.4 30.0 - 36.0 g/dL   RDW 13.9 11.5 - 15.5 %   Platelets 199.0 150.0 - 400.0 K/uL   Neutrophils Relative % 57.7 43.0 - 77.0 %   Lymphocytes Relative 29.5 12.0 - 46.0 %   Monocytes Relative 7.4 3.0 - 12.0 %   Eosinophils Relative 4.9 0.0 - 5.0 %   Basophils Relative 0.5 0.0 - 3.0 %   Neutro Abs 3.7 1.4 - 7.7 K/uL   Lymphs Abs 1.9 0.7 - 4.0 K/uL   Monocytes Absolute 0.5 0.1 - 1.0 K/uL   Eosinophils Absolute 0.3 0.0 - 0.7 K/uL   Basophils Absolute 0.0 0.0 - 0.1 K/uL  VITAMIN D 25 Hydroxy (Vit-D Deficiency, Fractures)  Result Value Ref Range   VITD 29.14 (L) 30.00 - 100.00 ng/mL  Lipid panel  Result Value Ref Range   Cholesterol 190 0 - 200 mg/dL   Triglycerides 88.0 0.0 - 149.0 mg/dL   HDL 43.50 >39.00 mg/dL   VLDL 17.6 0.0 - 40.0 mg/dL   LDL Cholesterol 129 (H) 0 - 99 mg/dL   Total CHOL/HDL Ratio 4    NonHDL 146.98   Comprehensive metabolic panel  Result Value Ref Range   Sodium 139 135 - 145 mEq/L   Potassium 4.6 3.5 - 5.1 mEq/L   Chloride 105 96 - 112 mEq/L   CO2 29 19 - 32 mEq/L   Glucose, Bld 113 (H) 70 - 99 mg/dL   BUN 29 (H) 6 - 23 mg/dL   Creatinine, Ser 1.29 0.40 - 1.50 mg/dL   Total Bilirubin 0.5 0.2 - 1.2 mg/dL   Alkaline Phosphatase 54 39 - 117 U/L   AST 14 0 - 37 U/L   ALT 11 0 - 53 U/L   Total Protein 5.8 (L) 6.0 - 8.3 g/dL   Albumin 4.1 3.5 -  5.2 g/dL   Calcium 9.1 8.4 - 10.5 mg/dL   GFR  52.53 (L) >60.00 mL/min   Assessment & Plan:   Problem List Items Addressed This Visit    Memory deficit    Reviewed concerns with decreased MMSE noted today. Suspicious for moderate degree of cognitive impairment ?senile dementia. He denies concerns. Encouraged regular exercise, nutritious diet, keeping mid active with reading and word puzzles, and social engagement. I asked him to return in 4-6 months with wife for further evaluation. Discussed road safety, recommend he always drive with someone.       High frequency hearing loss    Appreciate audiology care - new hearing aides working well.      Essential tremor    on primodone started 2019 followed by neurology Melrose Nakayama).       Essential hypertension    Chronic, improved. Continue current regimen.  He has started walking regularly.          No orders of the defined types were placed in this encounter.  No orders of the defined types were placed in this encounter.   Patient Instructions  Memory testing today raises some concern over memory trouble.  Make sure you eat a healthy diet, continue regular exercise routine, continue reading, memory exercises, puzzles, and continue social engagement.  Consider medication to help especially if noticing worsening memory trouble - we will hold off on this for now.   Follow up plan: Return in about 6 months (around 05/27/2019) for follow up visit.  Ria Bush, MD

## 2018-11-27 NOTE — Assessment & Plan Note (Signed)
on primodone started 2019 followed by neurology Melrose Nakayama).

## 2018-11-27 NOTE — Assessment & Plan Note (Addendum)
Chronic, improved. Continue current regimen.  He has started walking regularly.

## 2018-11-27 NOTE — Patient Instructions (Addendum)
Memory testing today raises some concern over memory trouble.  Make sure you eat a healthy diet, continue regular exercise routine, continue reading, memory exercises, puzzles, and continue social engagement.  Consider medication to help especially if noticing worsening memory trouble - we will hold off on this for now.

## 2018-11-27 NOTE — Assessment & Plan Note (Addendum)
Reviewed concerns with decreased MMSE noted today. Suspicious for moderate degree of cognitive impairment ?senile dementia. He denies concerns. Encouraged regular exercise, nutritious diet, keeping mid active with reading and word puzzles, and social engagement. I asked him to return in 4-6 months with wife for further evaluation. Discussed road safety, recommend he always drive with someone.

## 2018-11-27 NOTE — Assessment & Plan Note (Addendum)
Appreciate audiology care - new hearing aides working well.

## 2018-12-09 DIAGNOSIS — L905 Scar conditions and fibrosis of skin: Secondary | ICD-10-CM | POA: Diagnosis not present

## 2018-12-09 DIAGNOSIS — Z85828 Personal history of other malignant neoplasm of skin: Secondary | ICD-10-CM | POA: Diagnosis not present

## 2019-01-11 DIAGNOSIS — L819 Disorder of pigmentation, unspecified: Secondary | ICD-10-CM | POA: Diagnosis not present

## 2019-01-11 DIAGNOSIS — Z85828 Personal history of other malignant neoplasm of skin: Secondary | ICD-10-CM | POA: Diagnosis not present

## 2019-01-11 DIAGNOSIS — L570 Actinic keratosis: Secondary | ICD-10-CM | POA: Diagnosis not present

## 2019-04-26 ENCOUNTER — Ambulatory Visit: Payer: Medicare Other | Attending: Internal Medicine

## 2019-04-26 DIAGNOSIS — Z23 Encounter for immunization: Secondary | ICD-10-CM

## 2019-04-26 NOTE — Progress Notes (Signed)
   Covid-19 Vaccination Clinic  Name:  Shawn Andrews    MRN: [id number] DOB: Dec 27, 1928  04/26/2019  Shawn Andrews was observed post Covid-19 immunization for 15 minutes without incident. He was provided with Vaccine Information Sheet and instruction to access the V-Safe system.   Shawn Andrews was instructed to call 911 with any severe reactions post vaccine: Marland Kitchen Difficulty breathing  . Swelling of face and throat  . A fast heartbeat  . A bad rash all over body  . Dizziness and weakness   Immunizations Administered    Name Date Dose VIS Date Route   Pfizer COVID-19 Vaccine 04/26/2019  1:25 PM 0.3 mL 01/01/2019 Intramuscular   Manufacturer: [place]   Lot: [id number]   [place]: [id number]

## 2019-05-19 DIAGNOSIS — L819 Disorder of pigmentation, unspecified: Secondary | ICD-10-CM | POA: Diagnosis not present

## 2019-05-19 DIAGNOSIS — L821 Other seborrheic keratosis: Secondary | ICD-10-CM | POA: Diagnosis not present

## 2019-05-19 DIAGNOSIS — D485 Neoplasm of uncertain behavior of skin: Secondary | ICD-10-CM | POA: Diagnosis not present

## 2019-05-19 DIAGNOSIS — D1801 Hemangioma of skin and subcutaneous tissue: Secondary | ICD-10-CM | POA: Diagnosis not present

## 2019-05-19 DIAGNOSIS — B351 Tinea unguium: Secondary | ICD-10-CM | POA: Diagnosis not present

## 2019-05-19 DIAGNOSIS — L570 Actinic keratosis: Secondary | ICD-10-CM | POA: Diagnosis not present

## 2019-05-19 DIAGNOSIS — D229 Melanocytic nevi, unspecified: Secondary | ICD-10-CM | POA: Diagnosis not present

## 2019-05-19 DIAGNOSIS — B079 Viral wart, unspecified: Secondary | ICD-10-CM | POA: Diagnosis not present

## 2019-05-25 ENCOUNTER — Ambulatory Visit: Payer: Medicare Other | Attending: Internal Medicine

## 2019-05-25 DIAGNOSIS — Z23 Encounter for immunization: Secondary | ICD-10-CM

## 2019-05-25 NOTE — Progress Notes (Signed)
   Covid-19 Vaccination Clinic  Name:  Shawn Andrews    MRN: [id number] DOB: [birth year]  05/25/2019  Shawn Andrews was observed post Covid-19 immunization for 30 minutes based on pre-vaccination screening without incident. He was provided with Vaccine Information Sheet and instruction to access the V-Safe system.   Shawn Andrews was instructed to call 911 with any severe reactions post vaccine: Marland Kitchen Difficulty breathing  . Swelling of face and throat  . A fast heartbeat  . A bad rash all over body  . Dizziness and weakness   Immunizations Administered    Name Date Dose VIS Date Route   Pfizer COVID-19 Vaccine 05/25/2019  8:24 AM 0.3 mL 03/17/2018 Intramuscular   Manufacturer: [place]   Lot: [id number]   [place]: [id number]

## 2019-05-27 ENCOUNTER — Encounter: Payer: Self-pay | Admitting: Family Medicine

## 2019-05-27 ENCOUNTER — Other Ambulatory Visit: Payer: Self-pay

## 2019-05-27 ENCOUNTER — Ambulatory Visit (INDEPENDENT_AMBULATORY_CARE_PROVIDER_SITE_OTHER): Payer: Medicare Other | Admitting: Family Medicine

## 2019-05-27 VITALS — BP 186/86 | HR 65 | Temp 97.9°F | Ht 69.25 in | Wt 199.3 lb

## 2019-05-27 DIAGNOSIS — I10 Essential (primary) hypertension: Secondary | ICD-10-CM

## 2019-05-27 DIAGNOSIS — R413 Other amnesia: Secondary | ICD-10-CM | POA: Diagnosis not present

## 2019-05-27 DIAGNOSIS — N183 Chronic kidney disease, stage 3 unspecified: Secondary | ICD-10-CM

## 2019-05-27 MED ORDER — LISINOPRIL 5 MG PO TABS: 5.0000 mg | ORAL_TABLET | Freq: Every day | ORAL | 5 refills | Status: DC

## 2019-05-27 NOTE — Assessment & Plan Note (Addendum)
Chronic, deteriorated, unclear cause. Anticipate component of arteriosclerosis. Continue current regimen, start low dose lisinopril 5mg  daily, RTC 10 days for Cr and K check (h/o ACEI induced hyperkalemia 2016).  RTC 3 mo CPE, sooner if any concerns of persistently elevated readings at home.  Pt agrees with plan.

## 2019-05-27 NOTE — Patient Instructions (Addendum)
Blood pressures are staying too high.  Continue current medicines, add on another blood pressure medicine called lisinopril 5mg  daily.  Return in 10 days to check blood work (kidney function) Return in 3 months for physical/wellness visit

## 2019-05-27 NOTE — Assessment & Plan Note (Signed)
Overall stable period. Wife was unable to come today. Will reassess at 3 mo f/u visit.

## 2019-05-27 NOTE — Progress Notes (Signed)
This visit was conducted in person.  BP (!) 186/86 (BP Location: Right Arm, Patient Position: Sitting, Cuff Size: Normal)   Pulse 65   Temp 97.9 F (36.6 C) (Temporal)   Ht 5' 9.25" (1.759 m)   Wt 199 lb 5 oz (90.4 kg)   SpO2 96%   BMI 29.22 kg/m   Elevated on repeat CC: 6 mo f/u visit  Subjective:    Patient ID: Shawn Andrews, male    DOB: Nov 21, 1928, 86 y.o.   MRN: [id number]  HPI: Shawn Andrews is a 86 y.o. male presenting on 05/27/2019 for Follow-up (Here for 6 mo f/u.)   HTN - Compliant with current antihypertensive regimen of amlodipine 10mg  daily, cardura 4mg  daily, metoprolol 50mg  bid. Does check blood pressures at home: running high like today. Less walking recently - due to bad knee and bad hip. Tries to avoid salt. No low blood pressure readings or symptoms of dizziness/syncope. Denies HA, vision changes, CP/tightness, SOB, leg swelling.    Essential tremor managed by neurology on primodone (started 2019) with benefit.   Memory deficit - last visit MMSE 24/30, CDT 2/4 - thought moderate degree of cognitive impairment ?senile dementia. I asked him to return today with wife for recheck. She was unable to come as they currently have company from out of town.      Relevant past medical, surgical, family and social history reviewed and updated as indicated. Interim medical history since our last visit reviewed. Allergies and medications reviewed and updated. Outpatient Medications Prior to Visit  Medication Sig Dispense Refill  . acetaminophen (TYLENOL 8 HOUR) 650 MG CR tablet Take 1 tablet (650 mg total) by mouth 2 (two) times a day.    . ALPRAZolam (XANAX) 0.25 MG tablet Take 1 tablet by mouth daily.    Marland Kitchen amLODipine (NORVASC) 10 MG tablet TAKE 1 TABLET BY MOUTH  DAILY 90 tablet 3  . Cholecalciferol (VITAMIN D3) 25 MCG (1000 UT) CAPS Take 1 capsule (1,000 Units total) by mouth daily. 30 capsule   . doxazosin (CARDURA) 4 MG tablet TAKE 1 TABLET BY MOUTH  DAILY 90 tablet 3    . finasteride (PROSCAR) 5 MG tablet TAKE 1 TABLET BY MOUTH  DAILY 90 tablet 3  . FLAXSEED, LINSEED, PO Take 1 tablet by mouth 2 (two) times daily.     Marland Kitchen loratadine (CLARITIN) 10 MG tablet Take 1 tablet (10 mg total) by mouth daily as needed for allergies. 30 tablet   . metoprolol tartrate (LOPRESSOR) 50 MG tablet TAKE 1 TABLET BY MOUTH  TWICE DAILY 180 tablet 3  . primidone (MYSOLINE) 50 MG tablet Take 50 mg by mouth 2 (two) times daily.     . TOLAK 4 % CREA Apply 1 application topically daily. Apply 1 small amount to affected area once daily.  Apply to arms from elbows down to back of hands and on biopsy site on right lower leg    . vitamin B-12 (CYANOCOBALAMIN) 500 MCG tablet Take 500 mcg by mouth daily.     No facility-administered medications prior to visit.     Per HPI unless specifically indicated in ROS section below Review of Systems Objective:  BP (!) 186/86 (BP Location: Right Arm, Patient Position: Sitting, Cuff Size: Normal)   Pulse 65   Temp 97.9 F (36.6 C) (Temporal)   Ht 5' 9.25" (1.759 m)   Wt 199 lb 5 oz (90.4 kg)   SpO2 96%   BMI 29.22 kg/m   Wt  Readings from Last 3 Encounters:  05/27/19 199 lb 5 oz (90.4 kg)  11/27/18 196 lb 8 oz (89.1 kg)  08/25/18 197 lb 6 oz (89.5 kg)      Physical Exam Vitals and nursing note reviewed.  Constitutional:      Appearance: Normal appearance. He is not ill-appearing.  Eyes:     Extraocular Movements: Extraocular movements intact.     Pupils: Pupils are equal, round, and reactive to light.  Cardiovascular:     Rate and Rhythm: Normal rate and regular rhythm.     Pulses: Normal pulses.     Heart sounds: Normal heart sounds. No murmur.  Pulmonary:     Effort: Pulmonary effort is normal. No respiratory distress.     Breath sounds: Normal breath sounds. No wheezing, rhonchi or rales.  Musculoskeletal:     Right lower leg: No edema.     Left lower leg: No edema.  Neurological:     Mental Status: He is alert.   Psychiatric:        Mood and Affect: Mood normal.        Behavior: Behavior normal.       Results for orders placed or performed in visit on 08/18/18  CBC with Differential/Platelet  Result Value Ref Range   WBC 6.3 4.0 - 10.5 K/uL   RBC 4.32 4.22 - 5.81 Mil/uL   Hemoglobin 13.0 13.0 - 17.0 g/dL   HCT 37.8 (L) 39.0 - 52.0 %   MCV 87.5 78.0 - 100.0 fl   MCHC 34.4 30.0 - 36.0 g/dL   RDW 13.9 11.5 - 15.5 %   Platelets 199.0 150.0 - 400.0 K/uL   Neutrophils Relative % 57.7 43.0 - 77.0 %   Lymphocytes Relative 29.5 12.0 - 46.0 %   Monocytes Relative 7.4 3.0 - 12.0 %   Eosinophils Relative 4.9 0.0 - 5.0 %   Basophils Relative 0.5 0.0 - 3.0 %   Neutro Abs 3.7 1.4 - 7.7 K/uL   Lymphs Abs 1.9 0.7 - 4.0 K/uL   Monocytes Absolute 0.5 0.1 - 1.0 K/uL   Eosinophils Absolute 0.3 0.0 - 0.7 K/uL   Basophils Absolute 0.0 0.0 - 0.1 K/uL  VITAMIN D 25 Hydroxy (Vit-D Deficiency, Fractures)  Result Value Ref Range   VITD 29.14 (L) 30.00 - 100.00 ng/mL  Lipid panel  Result Value Ref Range   Cholesterol 190 0 - 200 mg/dL   Triglycerides 88.0 0.0 - 149.0 mg/dL   HDL 43.50 >39.00 mg/dL   VLDL 17.6 0.0 - 40.0 mg/dL   LDL Cholesterol 129 (H) 0 - 99 mg/dL   Total CHOL/HDL Ratio 4    NonHDL 146.98   Comprehensive metabolic panel  Result Value Ref Range   Sodium 139 135 - 145 mEq/L   Potassium 4.6 3.5 - 5.1 mEq/L   Chloride 105 96 - 112 mEq/L   CO2 29 19 - 32 mEq/L   Glucose, Bld 113 (H) 70 - 99 mg/dL   BUN 29 (H) 6 - 23 mg/dL   Creatinine, Ser 1.29 0.40 - 1.50 mg/dL   Total Bilirubin 0.5 0.2 - 1.2 mg/dL   Alkaline Phosphatase 54 39 - 117 U/L   AST 14 0 - 37 U/L   ALT 11 0 - 53 U/L   Total Protein 5.8 (L) 6.0 - 8.3 g/dL   Albumin 4.1 3.5 - 5.2 g/dL   Calcium 9.1 8.4 - 10.5 mg/dL   GFR 52.53 (L) >60.00 mL/min   Assessment & Plan:  This visit occurred during the SARS-CoV-2 public health emergency.  Safety protocols were in place, including screening questions prior to the visit, additional  usage of staff PPE, and extensive cleaning of exam room while observing appropriate contact time as indicated for disinfecting solutions.   Problem List Items Addressed This Visit    Memory deficit    Overall stable period. Wife was unable to come today. Will reassess at 3 mo f/u visit.       Essential hypertension - Primary    Chronic, deteriorated, unclear cause. Anticipate component of arteriosclerosis. Continue current regimen, start low dose lisinopril 5mg  daily, RTC 10 days for Cr and K check (h/o ACEI induced hyperkalemia 2016).  RTC 3 mo CPE, sooner if any concerns of persistently elevated readings at home.  Pt agrees with plan.       Relevant Medications   lisinopril (ZESTRIL) 5 MG tablet   Other Relevant Orders   Renal function panel   CKD (chronic kidney disease) stage 3, GFR 30-59 ml/min    Check kidneys next week.       Relevant Orders   Renal function panel       Meds ordered this encounter  Medications  . lisinopril (ZESTRIL) 5 MG tablet    Sig: Take 1 tablet (5 mg total) by mouth daily.    Dispense:  30 tablet    Refill:  5   Orders Placed This Encounter  Procedures  . Renal function panel    Standing Status:   Future    Standing Expiration Date:   05/26/2020    Patient Instructions  Blood pressures are staying too high.  Continue current medicines, add on another blood pressure medicine called lisinopril 5mg  daily.  Return in 10 days to check blood work (kidney function) Return in 3 months for physical/wellness visit    Follow up plan: Return in about 3 months (around 08/27/2019) for annual exam, prior fasting for blood work, medicare wellness visit.  Ria Bush, MD

## 2019-05-27 NOTE — Assessment & Plan Note (Signed)
Check kidneys next week.

## 2019-05-31 ENCOUNTER — Telehealth: Payer: Self-pay

## 2019-05-31 MED ORDER — LOSARTAN POTASSIUM 25 MG PO TABS: 12.5000 mg | ORAL_TABLET | Freq: Every day | ORAL | 3 refills | Status: DC

## 2019-05-31 NOTE — Addendum Note (Signed)
Addended by: Ria Bush on: 05/31/2019 01:57 PM   Modules accepted: Orders

## 2019-05-31 NOTE — Telephone Encounter (Signed)
Patient contacted the office stating that he was in the office on 5/6 and Dr. Darnell Level prescribed Lisinopril. Patient states he has been taking this and he has been having a nagging dry cough. Patient states he recalls a previous provider who has since retired had him on Lisinopril years ago, and he also had a cough when taking it then. Patient is wondering if there are any other medications he can try?

## 2019-05-31 NOTE — Telephone Encounter (Signed)
Spoke with pt relaying Dr. G's message. Pt verbalizes understanding.  

## 2019-05-31 NOTE — Telephone Encounter (Signed)
Thanks for the update.  Let's stop lisinopril and start losartan 25mg  1/2 tab daily.

## 2019-06-09 ENCOUNTER — Telehealth: Payer: Self-pay | Admitting: *Deleted

## 2019-06-09 ENCOUNTER — Other Ambulatory Visit (INDEPENDENT_AMBULATORY_CARE_PROVIDER_SITE_OTHER): Payer: Medicare Other

## 2019-06-09 ENCOUNTER — Other Ambulatory Visit: Payer: Self-pay

## 2019-06-09 ENCOUNTER — Other Ambulatory Visit: Payer: Medicare Other

## 2019-06-09 DIAGNOSIS — I10 Essential (primary) hypertension: Secondary | ICD-10-CM

## 2019-06-09 DIAGNOSIS — N183 Chronic kidney disease, stage 3 unspecified: Secondary | ICD-10-CM

## 2019-06-09 LAB — RENAL FUNCTION PANEL
Albumin: 4.2 g/dL (ref 3.5–5.2)
BUN: 32 mg/dL — ABNORMAL HIGH (ref 6–23)
CO2: 30 mEq/L (ref 19–32)
Calcium: 8.9 mg/dL (ref 8.4–10.5)
Chloride: 105 mEq/L (ref 96–112)
Creatinine, Ser: 1.28 mg/dL (ref 0.40–1.50)
GFR: 52.91 mL/min — ABNORMAL LOW (ref >60.00)
Glucose, Bld: 134 mg/dL — ABNORMAL HIGH (ref 70–99)
Phosphorus: 3.1 mg/dL (ref 2.3–4.6)
Potassium: 4.5 mEq/L (ref 3.5–5.1)
Sodium: 140 mEq/L (ref 135–145)

## 2019-06-09 NOTE — Telephone Encounter (Signed)
Thanks I will see him then

## 2019-06-09 NOTE — Telephone Encounter (Signed)
Patient's wife called stating that she is concerned about her husband's blood pressure. Mrs. Galiano stated that her husband was on Lisinopril and it was changed because it caused a cough. Patient's wife stated that patient was put on Losartan 25 mg 1/2 tablet daily on 05/31/19. Patient's wife stated that her husband went outside and did some yard work. Mrs. Maloney stated that her husband came in with a headache and she checked his blood pressure and it was 185/96 heart rate 76. Spoke to patient and was advised that his headache was getting a little better.    After speaking with Dr. Glori Bickers patient's wife was advised to go ahead and give him the other half of the Losartan now. Patient scheduled an appointment tomorrow 06/10/19 at 11:45 am with Dr. Glori Bickers.  Mrs. Parveen was advised that her husband needs to rest and drink plenty of fluids today. ER precautions given to patient's wife and she verbalized understanding. Advised Mrs. Schafer to bring their blood pressure kit to the appointment tomorrow so that it can be compared to the reading that we get in the office.

## 2019-06-10 ENCOUNTER — Encounter: Payer: Self-pay | Admitting: Family Medicine

## 2019-06-10 ENCOUNTER — Ambulatory Visit (INDEPENDENT_AMBULATORY_CARE_PROVIDER_SITE_OTHER): Payer: Medicare Other | Admitting: Family Medicine

## 2019-06-10 VITALS — BP 162/80 | HR 69 | Temp 97.0°F | Ht 69.25 in | Wt 194.4 lb

## 2019-06-10 DIAGNOSIS — I10 Essential (primary) hypertension: Secondary | ICD-10-CM

## 2019-06-10 NOTE — Progress Notes (Signed)
Subjective:    Patient ID: Shawn Andrews, male    DOB: 01-04-1929, 86 y.o.   MRN: [id number]  This visit occurred during the SARS-CoV-2 public health emergency.  Safety protocols were in place, including screening questions prior to the visit, additional usage of staff PPE, and extensive cleaning of exam room while observing appropriate contact time as indicated for disinfecting solutions.    HPI 86 yo pt of Dr Darnell Level presents with HTN/ blood pressure concerns   Wt Readings from Last 3 Encounters:  06/10/19 194 lb 6 oz (88.2 kg)  05/27/19 199 lb 5 oz (90.4 kg)  11/27/18 196 lb 8 oz (89.1 kg)   28.50 kg/m   Came in from yard work with a headache yesterday  bp was 185/96  With HR of 76  Headache did improve    (very seldom gets a headache)  BP Readings from Last 3 Encounters:  06/10/19 (!) 175/80  05/27/19 (!) 186/86  11/27/18 132/80   BP: (!) 162/80 our machine   166/71 pulse of 65    Repeat on his machine (arm cuff sized regular)    Pulse Readings from Last 3 Encounters:  06/10/19 69  05/27/19 65  11/27/18 60   [facility] - 15 min on treadmill and 15 on bicycle and then some weights      3-4 days per week   No change in diet  He eats a lot of vegetables  Avoids processed foods an sodium   Feels ok today  No headache   He has a hx of HTN as well as CKD 3 , hyperlipidemia and memory deficit   Last visit on 5/6 with Dr Darnell Level Noted HTN had deteriorated /suspect arteriosclerosis inst to start low dose lisinopril 5 mg daily with f/u in 10 d for labs  (h/o elevated K with ace in the past) -and it caused a cough so was changed to losartan   Lab Results  Component Value Date   CREATININE 1.28 06/09/2019   BUN 32 (H) 06/09/2019   NA 140 06/09/2019   K 4.5 06/09/2019   CL 105 06/09/2019   CO2 30 06/09/2019   He also takes amlodipine 10 mg  Doxazosin 4 mg  Losartan 12.5 mg daily -inst when called yesterday to inc to 1 pill daily  (did take  one pill this am)   Metoprolol 50 mg bid   Patient Active Problem List   Diagnosis Date Noted  . Skin lesion 07/18/2016  . Health maintenance examination 07/19/2015  . Advanced care planning/counseling discussion 07/05/2014  . Memory deficit 07/05/2014  . CKD (chronic kidney disease) stage 3, GFR 30-59 ml/min 06/23/2013  . Post-nasal drainage 12/22/2012  . Medicare annual wellness visit, subsequent 06/15/2012  . Lumbosacral radiculopathy at L5 06/17/2011  . High frequency hearing loss 10/18/2010  . Vitamin D deficiency 11/15/2008  . 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 Essential hypertension 05/22/2003  . HLD (hyperlipidemia) 01/21/2002  . Essential tremor 01/22/2000  . ELEVATED PROSTATE SPECIFIC ANTIGEN 01/22/2000  . Benign prostatic hyperplasia 01/21/1998  . OSTEOARTHRITIS 01/22/1988   Past Medical History:  Diagnosis Date  . Abnormal drug screen 06/2014   inapprop neg xanax rpt 3 mo (06/2014)  . BPH (benign prostatic hypertrophy) 01/21/98   has had 3 biopsies in past ([facility]) decided to stop PSA/DRE  . Hyperlipidemia 01/21/02  . Hypertension 05/22/03  . Left lumbar radiculopathy   . Osteoarthritis 01/22/88   knees, lumbar spondylosis  and listhesis   Past Surgical History:  Procedure Laterality Date  . CATARACT EXTRACTION  2013   bilateral  . ESI Left 08/2014, 11/2014, 08/2015   L S1, L L5/S1 transforaminal ESI; L4/5 L5/S1 zygapophysial injections, L S1 transforaminal ([facility])  . FINGER SURGERY     right middle, partial traumatic amputation  . KNEE ARTHROSCOPY  1990   right  . KNEE SURGERY  04/2006   right partial knee replacement in [place] - rec ppx abx for any invasive procedure   Social History   Tobacco Use  . Smoking status: Former Smoker    Packs/day: 0.00    Years: 0.00    Pack years: 0.00    Types: Pipe    Quit date: 12/26/2000    Years since quitting: 18.4  . Smokeless tobacco: Current User    Types: Chew  . Tobacco comment: quit over 30 years ago  Substance  Use Topics  . Alcohol use: Yes    Alcohol/week: 0.0 standard drinks    Comment: Occasional beer  . Drug use: No   Family History  Problem Relation Age of Onset  . Hypertension Mother   . Heart disease Brother 42       MI  . Diabetes Brother   . Cancer Daughter        melanoma  . Stroke Neg Hx    Allergies  Allergen Reactions  . Cialis [Tadalafil] Other (See Comments)    Indigestion and "felt like crap"  . Lipitor [Atorvastatin] Other (See Comments)    Severe myalgias  . Lisinopril Cough    Dry nagging cough  . Penicillins Swelling    Of tongue Has patient had a PCN reaction causing immediate rash, facial/tongue/throat swelling, SOB or lightheadedness with hypotension: Yes Has patient had a PCN reaction causing severe rash involving mucus membranes or skin necrosis: No Has patient had a PCN reaction that required hospitalization: No Has patient had a PCN reaction occurring within the last 10 years: No If all of the above answers are "NO", then may proceed with Cephalosporin use.   Current Outpatient Medications on File Prior to Visit  Medication Sig Dispense Refill  . acetaminophen (TYLENOL 8 HOUR) 650 MG CR tablet Take 1 tablet (650 mg total) by mouth 2 (two) times a day.    Marland Kitchen amLODipine (NORVASC) 10 MG tablet TAKE 1 TABLET BY MOUTH  DAILY 90 tablet 3  . Cholecalciferol (VITAMIN D3) 25 MCG (1000 UT) CAPS Take 1 capsule (1,000 Units total) by mouth daily. 30 capsule   . doxazosin (CARDURA) 4 MG tablet TAKE 1 TABLET BY MOUTH  DAILY 90 tablet 3  . finasteride (PROSCAR) 5 MG tablet TAKE 1 TABLET BY MOUTH  DAILY 90 tablet 3  . FLAXSEED, LINSEED, PO Take 1 tablet by mouth 2 (two) times daily.     Marland Kitchen loratadine (CLARITIN) 10 MG tablet Take 1 tablet (10 mg total) by mouth daily as needed for allergies. 30 tablet   . losartan (COZAAR) 25 MG tablet Take 0.5 tablets (12.5 mg total) by mouth daily. 30 tablet 3  . metoprolol tartrate (LOPRESSOR) 50 MG tablet TAKE 1 TABLET BY MOUTH  TWICE  DAILY 180 tablet 3  . primidone (MYSOLINE) 50 MG tablet Take 50 mg by mouth 2 (two) times daily.     . vitamin B-12 (CYANOCOBALAMIN) 500 MCG tablet Take 500 mcg by mouth daily.     No current facility-administered medications on file prior to visit.     Review of  Systems  Constitutional: Negative for activity change, appetite change, fatigue, fever and unexpected weight change.  HENT: Negative for congestion, rhinorrhea, sore throat and trouble swallowing.   Eyes: Negative for pain, redness, itching and visual disturbance.  Respiratory: Negative for cough, chest tightness, shortness of breath and wheezing.   Cardiovascular: Negative for chest pain and palpitations.  Gastrointestinal: Negative for abdominal pain, blood in stool, constipation, diarrhea and nausea.  Endocrine: Negative for cold intolerance, heat intolerance, polydipsia and polyuria.  Genitourinary: Negative for difficulty urinating, dysuria, frequency and urgency.  Musculoskeletal: Negative for arthralgias, joint swelling and myalgias.  Skin: Negative for pallor and rash.  Neurological: Negative for dizziness, tremors, weakness, numbness and headaches.       No longer has a headache   Hematological: Negative for adenopathy. Does not bruise/bleed easily.  Psychiatric/Behavioral: Negative for decreased concentration and dysphoric mood. The patient is not nervous/anxious.        Objective:   Physical Exam Constitutional:      General: He is not in acute distress.    Appearance: Normal appearance. He is well-developed and normal weight. He is not ill-appearing.  HENT:     Head: Normocephalic and atraumatic.  Eyes:     Conjunctiva/sclera: Conjunctivae normal.     Pupils: Pupils are equal, round, and reactive to light.  Neck:     Thyroid: No thyromegaly.     Vascular: No carotid bruit or JVD.  Cardiovascular:     Rate and Rhythm: Normal rate and regular rhythm.     Heart sounds: Normal heart sounds. No gallop.     Pulmonary:     Effort: Pulmonary effort is normal. No respiratory distress.     Breath sounds: Normal breath sounds. No wheezing or rales.  Abdominal:     General: Bowel sounds are normal. There is no distension or abdominal bruit.     Palpations: Abdomen is soft. There is no mass.     Tenderness: There is no abdominal tenderness.  Musculoskeletal:     Cervical back: Normal range of motion and neck supple.     Right lower leg: No edema.     Left lower leg: No edema.  Lymphadenopathy:     Cervical: No cervical adenopathy.  Skin:    General: Skin is warm and dry.     Findings: No rash.  Neurological:     Mental Status: He is alert.     Deep Tendon Reflexes: Reflexes are normal and symmetric.           Assessment & Plan:   Problem List Items Addressed This Visit      Cardiovascular and Mediastinum   Essential hypertension - Primary    bp is starting to come down more on losartan 25 mg  Unsure if we would be able to adv to 50 mg in light of past hyperkalemia BP: (!) 162/80 disc diet / need to watch sodium  Also exercise    Pt will continue to monitor tomorrow and over the weekend and then update [place] with readings  Adv to continue other medicines

## 2019-06-10 NOTE — Assessment & Plan Note (Addendum)
bp is starting to come down more on losartan 25 mg  Unsure if we would be able to adv to 50 mg in light of past hyperkalemia BP: (!) 162/80 disc diet / need to watch sodium  Also exercise    Pt will continue to monitor tomorrow and over the weekend and then update [place] with readings  Adv to continue other medicines

## 2019-06-10 NOTE — Patient Instructions (Signed)
When you work outdoors -be sure to drink more water   Stay on the losartan 25 mg (one pill)  Blood pressure is improving  Let's give it through the weekend to see if we can get blood pressure to get down more  At or under 140 on top/ at or under 90 on the bottom   Please call on Monday and let us know how your blood pressure is doing (let us know some readings)

## 2019-06-15 ENCOUNTER — Telehealth: Payer: Self-pay | Admitting: *Deleted

## 2019-06-15 MED ORDER — LOSARTAN POTASSIUM 25 MG PO TABS: 25.0000 mg | ORAL_TABLET | Freq: Every day | ORAL | 9 refills | Status: DC

## 2019-06-15 NOTE — Telephone Encounter (Signed)
Spoke with pt relaying Dr. G's message.  Verbalizes understanding.  

## 2019-06-15 NOTE — Telephone Encounter (Signed)
BP is improving but staying a bit high - continue losartan 25mg  daily, call us again in 2 wks for updated BP readings (want to give medicine more time to work). 25mg  dose with new instructions sent to pharmacy.

## 2019-06-15 NOTE — Telephone Encounter (Signed)
Patient's wife called wanting to know what Dr. Danise Mina has decided to do about her husband's blood pressure medication. Please see notes on lab results.

## 2019-07-01 ENCOUNTER — Telehealth: Payer: Self-pay

## 2019-07-01 NOTE — Telephone Encounter (Signed)
Overall good readings (especially this past week). Recommend continue current regimen. Let us know if BP trending up again.  Continue low salt diet, good water and fruits/vegetable intake.

## 2019-07-01 NOTE — Telephone Encounter (Signed)
Placed in Dr Synthia Innocent Inbox in his office.

## 2019-07-01 NOTE — Telephone Encounter (Signed)
Spoke to pt

## 2019-07-01 NOTE — Telephone Encounter (Signed)
Patient left a record of his blood pressure reading for Dr. Darnell Level

## 2019-08-02 ENCOUNTER — Emergency Department
Admission: EM | Admit: 2019-08-02 | Discharge: 2019-08-02 | Disposition: A | Discharge status: Home / Self Care | Payer: Medicare Other | Attending: Emergency Medicine | Admitting: Emergency Medicine

## 2019-08-02 ENCOUNTER — Other Ambulatory Visit: Payer: Self-pay

## 2019-08-02 ENCOUNTER — Encounter: Payer: Self-pay | Admitting: Emergency Medicine

## 2019-08-02 ENCOUNTER — Emergency Department: Payer: Medicare Other

## 2019-08-02 DIAGNOSIS — I82412 Acute embolism and thrombosis of left femoral vein: Secondary | ICD-10-CM

## 2019-08-02 DIAGNOSIS — Z79899 Other long term (current) drug therapy: Secondary | ICD-10-CM | POA: Insufficient documentation

## 2019-08-02 DIAGNOSIS — Z87891 Personal history of nicotine dependence: Secondary | ICD-10-CM | POA: Insufficient documentation

## 2019-08-02 DIAGNOSIS — R2242 Localized swelling, mass and lump, left lower limb: Secondary | ICD-10-CM | POA: Diagnosis present

## 2019-08-02 DIAGNOSIS — I82432 Acute embolism and thrombosis of left popliteal vein: Secondary | ICD-10-CM | POA: Diagnosis not present

## 2019-08-02 DIAGNOSIS — Z96651 Presence of right artificial knee joint: Secondary | ICD-10-CM | POA: Diagnosis not present

## 2019-08-02 DIAGNOSIS — N183 Chronic kidney disease, stage 3 unspecified: Secondary | ICD-10-CM | POA: Insufficient documentation

## 2019-08-02 DIAGNOSIS — I129 Hypertensive chronic kidney disease with stage 1 through stage 4 chronic kidney disease, or unspecified chronic kidney disease: Secondary | ICD-10-CM | POA: Insufficient documentation

## 2019-08-02 DIAGNOSIS — I824Z2 Acute embolism and thrombosis of unspecified deep veins of left distal lower extremity: Secondary | ICD-10-CM | POA: Diagnosis not present

## 2019-08-02 DIAGNOSIS — I82812 Embolism and thrombosis of superficial veins of left lower extremities: Secondary | ICD-10-CM | POA: Diagnosis not present

## 2019-08-02 LAB — BASIC METABOLIC PANEL
Anion gap: 7 (ref 5–15)
BUN: 29 mg/dL — ABNORMAL HIGH (ref 8–23)
CO2: 26 mmol/L (ref 22–32)
Calcium: 9 mg/dL (ref 8.9–10.3)
Chloride: 105 mmol/L (ref 98–111)
Creatinine, Ser: 1.17 mg/dL (ref 0.61–1.24)
GFR calc Af Amer: >60 mL/min (ref >60)
GFR calc non Af Amer: 55 mL/min — ABNORMAL LOW (ref >60)
Glucose, Bld: 108 mg/dL — ABNORMAL HIGH (ref 70–99)
Potassium: 4.9 mmol/L (ref 3.5–5.1)
Sodium: 138 mmol/L (ref 135–145)

## 2019-08-02 LAB — CBC WITH DIFFERENTIAL/PLATELET
Abs Immature Granulocytes: 0.02 10*3/uL (ref 0.00–0.07)
Basophils Absolute: 0 10*3/uL (ref 0.0–0.1)
Basophils Relative: 0 %
Eosinophils Absolute: 0.3 10*3/uL (ref 0.0–0.5)
Eosinophils Relative: 3 %
HCT: 35.2 % — ABNORMAL LOW (ref 39.0–52.0)
Hemoglobin: 12.1 g/dL — ABNORMAL LOW (ref 13.0–17.0)
Immature Granulocytes: 0 %
Lymphocytes Relative: 24 %
Lymphs Abs: 1.7 10*3/uL (ref 0.7–4.0)
MCH: 30 pg (ref 26.0–34.0)
MCHC: 34.4 g/dL (ref 30.0–36.0)
MCV: 87.1 fL (ref 80.0–100.0)
Monocytes Absolute: 0.6 10*3/uL (ref 0.1–1.0)
Monocytes Relative: 8 %
Neutro Abs: 4.7 10*3/uL (ref 1.7–7.7)
Neutrophils Relative %: 65 %
Platelets: 157 10*3/uL (ref 150–400)
RBC: 4.04 MIL/uL — ABNORMAL LOW (ref 4.22–5.81)
RDW: 12.9 % (ref 11.5–15.5)
WBC: 7.3 10*3/uL (ref 4.0–10.5)
nRBC: 0 % (ref 0.0–0.2)

## 2019-08-02 LAB — PROTIME-INR
INR: 1.1 (ref 0.8–1.2)
Prothrombin Time: 13.8 seconds (ref 11.4–15.2)

## 2019-08-02 MED ORDER — APIXABAN 5 MG PO TABS
10.0000 mg | ORAL_TABLET | Freq: Once | ORAL | Status: AC
  Administered 2019-08-02: 10 mg via ORAL
  Filled 2019-08-02: qty 2

## 2019-08-02 MED ORDER — APIXABAN (ELIQUIS) VTE STARTER PACK (10MG AND 5MG)
ORAL_TABLET | ORAL | 0 refills | Status: DC
Start: 2019-08-02 — End: 2019-08-25

## 2019-08-02 NOTE — ED Triage Notes (Signed)
Arrives from [facility] for ED evaluation of left lower leg swelling and pain.  Patient states swelling started Sunday.  Recent car trip to [place].

## 2019-08-02 NOTE — ED Notes (Signed)
Pt continues to rest calmly in bed. EDP Isaacs updated pt.

## 2019-08-02 NOTE — ED Notes (Signed)
Wife being screened to come back to bedside.

## 2019-08-02 NOTE — Discharge Instructions (Addendum)
Take the blood thinner as prescribed, starting tonight  FOR NOW, please stop taking your Primidone and call your primary doctor to discuss alternatives  It is OK to stay active. I'd recommend wearing a LONG, COMPRESSION STOCKING or SOCK on both legs to help with blood flow and swelling  Call Dr. Bunnie Domino office as above

## 2019-08-02 NOTE — ED Notes (Signed)
Pt up to restroom.

## 2019-08-02 NOTE — ED Provider Notes (Signed)
[facility] Emergency Department Provider Note  ____________________________________________   First MD Initiated Contact with Patient 08/02/19 1132     (approximate)  I have reviewed the triage vital signs and the nursing notes.   HISTORY  Chief Complaint Leg Swelling    HPI Shawn Andrews is a 86 y.o. male with past medical history as below here with left leg swelling.  The patient recently traveled to [place].  He drove in the car.  He states that upon returning yesterday, he noticed largely painless swelling of his left leg.  This is new for him.  He states that he was fine while in [place].  He does state that he traveled most of the day yesterday with his leg bent in the same position.  He went to his PCP today who sent him here for further evaluation.  He denies any chest pain or shortness of breath.  Denies any history of DVT.  No history of PE.  No recent orthopedic or other injuries.  No other acute complaints.  Denies any history of prior GI bleeds or history of bleeding diatheses.        Past Medical History:  Diagnosis Date  . Abnormal drug screen 06/2014   inapprop neg xanax rpt 3 mo (06/2014)  . BPH (benign prostatic hypertrophy) 01/21/98   has had 3 biopsies in past ([facility]) decided to stop PSA/DRE  . Hyperlipidemia 01/21/02  . Hypertension 05/22/03  . Left lumbar radiculopathy   . Osteoarthritis 01/22/88   knees, lumbar spondylosis and listhesis    Patient Active Problem List   Diagnosis Date Noted  . Skin lesion 07/18/2016  . Health maintenance examination 07/19/2015  . Advanced care planning/counseling discussion 07/05/2014  . Memory deficit 07/05/2014  . CKD (chronic kidney disease) stage 3, GFR 30-59 ml/min 06/23/2013  . Post-nasal drainage 12/22/2012  . Medicare annual wellness visit, subsequent 06/15/2012  . Lumbosacral radiculopathy at L5 06/17/2011  . High frequency hearing loss 10/18/2010  . Vitamin D deficiency 11/15/2008  .  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 Essential hypertension 05/22/2003  . HLD (hyperlipidemia) 01/21/2002  . Essential tremor 01/22/2000  . ELEVATED PROSTATE SPECIFIC ANTIGEN 01/22/2000  . Benign prostatic hyperplasia 01/21/1998  . OSTEOARTHRITIS 01/22/1988    Past Surgical History:  Procedure Laterality Date  . CATARACT EXTRACTION  2013   bilateral  . ESI Left 08/2014, 11/2014, 08/2015   L S1, L L5/S1 transforaminal ESI; L4/5 L5/S1 zygapophysial injections, L S1 transforaminal ([facility])  . FINGER SURGERY     right middle, partial traumatic amputation  . KNEE ARTHROSCOPY  1990   right  . KNEE SURGERY  04/2006   right partial knee replacement in [place] - rec ppx abx for any invasive procedure    Prior to Admission medications   Medication Sig Start Date End Date Taking? Authorizing Provider  amLODipine (NORVASC) 10 MG tablet TAKE 1 TABLET BY MOUTH  DAILY Patient taking differently: Take 10 mg by mouth daily.  10/28/18  Yes Ria Bush, MD  Cholecalciferol (VITAMIN D3) 25 MCG (1000 UT) CAPS Take 1 capsule (1,000 Units total) by mouth daily. 08/25/18  Yes Ria Bush, MD  doxazosin (CARDURA) 4 MG tablet TAKE 1 TABLET BY MOUTH  DAILY Patient taking differently: Take 4 mg by mouth daily. Take 1 tablet by mouth  daily 10/28/18  Yes Ria Bush, MD  finasteride (PROSCAR) 5 MG tablet TAKE 1 TABLET BY MOUTH  DAILY Patient taking differently: Take 5 mg by mouth daily.  10/28/18  Yes Ria Bush, MD  FLAXSEED, LINSEED, PO Take 1 tablet by mouth 2 (two) times daily.    Yes [provider]  losartan (COZAAR) 25 MG tablet Take 1 tablet (25 mg total) by mouth daily. 06/15/19  Yes Ria Bush, MD  metoprolol tartrate (LOPRESSOR) 50 MG tablet TAKE 1 TABLET BY MOUTH  TWICE DAILY Patient taking differently: Take 50 mg by mouth 2 (two) times daily.  10/28/18  Yes Ria Bush, MD  vitamin B-12 (CYANOCOBALAMIN) 500 MCG tablet Take 500 mcg by mouth daily.   Yes [provider]  acetaminophen (TYLENOL 8 HOUR) 650 MG CR tablet Take 1 tablet (650 mg total) by mouth 2 (two) times a day. 06/24/18   Ria Bush, MD  APIXABAN [place]ne [place]) VTE STARTER PACK (10MG  AND 5MG ) Take as directed on package: start with two-5mg  tablets twice daily for 7 days. On day 8, switch to one-5mg  tablet twice daily. 08/02/19   Duffy Bruce, MD  clindamycin (CLEOCIN) 150 MG capsule Take 4 capsules by mouth as directed. 06/08/19   [provider]  loratadine (CLARITIN) 10 MG tablet Take 1 tablet (10 mg total) by mouth daily as needed for allergies. 12/22/12   Ria Bush, MD    Allergies Penicillins, Cialis [tadalafil], Lipitor [atorvastatin], and Lisinopril  Family History  Problem Relation Age of Onset  . Hypertension Mother   . Heart disease Brother 14       MI  . Diabetes Brother   . Cancer Daughter        melanoma  . Stroke Neg Hx     Social History Social History   Tobacco Use  . Smoking status: Former Smoker    Packs/day: 0.00    Years: 0.00    Pack years: 0.00    Types: Pipe    Quit date: 12/26/2000    Years since quitting: 18.6  . Smokeless tobacco: Current User    Types: Chew  . Tobacco comment: quit over 30 years ago  Vaping Use  . Vaping Use: Never used  Substance Use Topics  . Alcohol use: Yes    Alcohol/week: 0.0 standard drinks    Comment: Occasional beer  . Drug use: No    Review of Systems  Review of Systems  Constitutional: Negative for chills, fatigue and fever.  HENT: Negative for sore throat.   Respiratory: Negative for shortness of breath.   Cardiovascular: Positive for leg swelling. Negative for chest pain.  Gastrointestinal: Negative for abdominal pain.  Genitourinary: Negative for flank pain.  Musculoskeletal: Negative for neck pain.  Skin: Negative for rash and wound.  Allergic/Immunologic: Negative for immunocompromised state.  Neurological: Negative for weakness and numbness.  Hematological: Does not  bruise/bleed easily.  All other systems reviewed and are negative.    ____________________________________________  PHYSICAL EXAM:      VITAL SIGNS: ED Triage Vitals  Enc Vitals Group     BP 08/02/19 0820 129/62     Pulse Rate 08/02/19 0820 63     Resp 08/02/19 0820 16     Temp 08/02/19 0820 (!) 97.2 F (36.2 C)     Temp Source 08/02/19 0820 Oral     SpO2 08/02/19 0820 98 %     Weight 08/02/19 0818 194 lb 7.1 oz (88.2 kg)     Height 08/02/19 0818 5' 9.25" (1.759 m)     Head Circumference --      Peak Flow --      Pain Score 08/02/19 0818 0  Pain Loc --      Pain Edu? --      Excl. in [place]? --      Physical Exam Vitals and nursing note reviewed.  Constitutional:      General: He is not in acute distress.    Appearance: He is well-developed.  HENT:     Head: Normocephalic and atraumatic.  Eyes:     Conjunctiva/sclera: Conjunctivae normal.  Cardiovascular:     Rate and Rhythm: Normal rate and regular rhythm.     Heart sounds: Normal heart sounds. No murmur heard.  No friction rub.  Pulmonary:     Effort: Pulmonary effort is normal. No respiratory distress.     Breath sounds: Normal breath sounds. No wheezing or rales.  Abdominal:     General: There is no distension.     Palpations: Abdomen is soft.     Tenderness: There is no abdominal tenderness.  Musculoskeletal:     Cervical back: Neck supple.     Left lower leg: Edema (Mild asymmetric pitting edema) present.     Comments: 2+ distal DP and PT pulses.  Toes warm and well perfused.  No cyanosis.  Skin:    General: Skin is warm.     Capillary Refill: Capillary refill takes less than 2 seconds.  Neurological:     Mental Status: He is alert and oriented to person, place, and time.     Motor: No abnormal muscle tone.       ____________________________________________   LABS (all labs ordered are listed, but only abnormal results are displayed)  Labs Reviewed  CBC WITH DIFFERENTIAL/PLATELET - Abnormal;  Notable for the following components:      Result Value   RBC 4.04 (*)    Hemoglobin 12.1 (*)    HCT 35.2 (*)    All other components within normal limits  BASIC METABOLIC PANEL - Abnormal; Notable for the following components:   Glucose, Bld 108 (*)    BUN 29 (*)    GFR calc non Af Amer 55 (*)    All other components within normal limits  PROTIME-INR    ____________________________________________  EKG:  ________________________________________  RADIOLOGY All imaging, including plain films, CT scans, and ultrasounds, independently reviewed by me, and interpretations confirmed via formal radiology reads.  ED MD interpretation:   Ultrasound: DVT of the left femoral popliteal and calf veins  Official radiology report(s): US Venous Img Lower Unilateral Left  Result Date: 08/02/2019 CLINICAL DATA:  Lower extremity edema EXAM: LEFT LOWER EXTREMITY VENOUS DUPLEX ULTRASOUND TECHNIQUE: Gray-scale sonography with graded compression, as well as color Doppler and duplex ultrasound were performed to evaluate the left lower extremity deep venous system from the level of the common femoral vein and including the common femoral, femoral, profunda femoral, popliteal and calf veins including the posterior tibial, peroneal and gastrocnemius veins when visible. The superficial great saphenous vein was also interrogated. Spectral Doppler was utilized to evaluate flow at rest and with distal augmentation maneuvers in the common femoral, femoral and popliteal veins. COMPARISON:  None. November 10, 2017 FINDINGS: Contralateral Common Femoral Vein: Respiratory phasicity is normal and symmetric with the symptomatic side. No evidence of thrombus. Normal compressibility. Common Femoral Vein: No evident thrombus. Normal compressibility and color flow on Doppler imaging. Saphenofemoral Junction: There is incompletely obstructing thrombus at the left saphenofemoral junction with diminished Doppler signal in this area  and diminished compression and augmentation. Profunda Femoral Vein: No evidence of thrombus. Normal compressibility and flow on color  Doppler imaging. Femoral Vein: There is acute appearing thrombus throughout the left femoral vein without appreciable augmentation or compression. Significantly diminished flow in this vessel. Popliteal Vein: There is acute appearing thrombus throughout the popliteal vein without appreciable augmentation or compression. Virtually no appreciable Doppler signal in this vessel. Calf Veins: Acute appearing thrombus noted throughout the calf vein regions with absent compression augmentation and diminished flow in these vessels. Superficial Great Saphenous Vein: No evidence of thrombus. Normal compressibility. Venous Reflux:  None. Other Findings:  None. IMPRESSION: Extensive acute appearing deep venous thrombosis in the left femoral vein, popliteal vein, and calf vein regions. There is incompletely obstructing thrombus at the left saphenofemoral junction as well. Right common femoral vein patent. Electronically Signed   By: Lowella Grip III M.D.   On: 08/02/2019 09:11    ____________________________________________  PROCEDURES   Procedure(s) performed (including Critical Care):  Procedures  ____________________________________________  INITIAL IMPRESSION / MDM / [place] / ED COURSE  As part of my medical decision making, I reviewed the following data within the [place] notes reviewed and incorporated, Old chart reviewed, Notes from prior ED visits, and [place] Controlled Substance Database       *Neko Boyajian Degollado was evaluated in Emergency Department on 08/02/2019 for the symptoms described in the history of present illness. He was evaluated in the context of the global COVID-19 pandemic, which necessitated consideration that the patient might be at risk for infection with the SARS-CoV-2 virus that causes COVID-19. Institutional  protocols and algorithms that pertain to the evaluation of patients at risk for COVID-19 are in a state of rapid change based on information released by regulatory bodies including the CDC and federal and state organizations. These policies and algorithms were followed during the patient's care in the ED.  Some ED evaluations and interventions may be delayed as a result of limited staffing during the pandemic.*     Medical Decision Making: 86 year old male here with left lower extremity DVT secondary to recent immobilization during trip to [place].  He has fully intact distal neuro vasculature and no evidence of phlegmasia.  No chest pain, shortness of breath, tachycardia, hypoxia, or evidence to suggest PE.  He has no history of GI bleeds or other high risk features for anticoagulation.  Discussed imaging and case with Dr. Lucky Cowboy, who recommends anticoagulation with Eliquis and outpatient follow-up.  Patient given first dose here and will discharge with prescription.  Return precautions given.  ____________________________________________  FINAL CLINICAL IMPRESSION(S) / ED DIAGNOSES  Final diagnoses:  Acute deep vein thrombosis (DVT) of femoral vein of left lower extremity ([place])     MEDICATIONS GIVEN DURING THIS VISIT:  Medications  apixaban (ELIQUIS) tablet 10 mg (10 mg Oral Given 08/02/19 1334)     ED Discharge Orders         Ordered    APIXABAN (ELIQUIS) VTE STARTER PACK (10MG  AND 5MG )     Discontinue  Reprint     08/02/19 1341           Note:  This document was prepared using Dragon voice recognition software and may include unintentional dictation errors.   Duffy Bruce, MD 08/02/19 [phone]

## 2019-08-02 NOTE — ED Notes (Addendum)
Pt in with swollen L leg post trip from [place]. Foot cool, ankle warm & swollen, lower leg hot and swollen but remains appropriate color. Pt has baseline movement in L leg. Slight pitting. Pt resp reg/unlabored. Pulse 1+; will use doppler soon.

## 2019-08-02 NOTE — ED Notes (Signed)
Deneise Lever RN at bedside.

## 2019-08-02 NOTE — ED Notes (Signed)
Hold on d/c per Deneise Lever RN as EDP [place] speaking with primary doc now.

## 2019-08-05 DIAGNOSIS — R251 Tremor, unspecified: Secondary | ICD-10-CM | POA: Diagnosis not present

## 2019-08-06 ENCOUNTER — Other Ambulatory Visit: Payer: Self-pay

## 2019-08-09 ENCOUNTER — Ambulatory Visit (INDEPENDENT_AMBULATORY_CARE_PROVIDER_SITE_OTHER): Payer: Medicare Other | Admitting: Family Medicine

## 2019-08-09 ENCOUNTER — Telehealth: Payer: Self-pay | Admitting: Family Medicine

## 2019-08-09 ENCOUNTER — Other Ambulatory Visit: Payer: Self-pay

## 2019-08-09 ENCOUNTER — Encounter: Payer: Self-pay | Admitting: Family Medicine

## 2019-08-09 VITALS — BP 162/72 | HR 57 | Temp 97.8°F | Ht 69.25 in | Wt 195.0 lb

## 2019-08-09 DIAGNOSIS — G250 Essential tremor: Secondary | ICD-10-CM | POA: Diagnosis not present

## 2019-08-09 DIAGNOSIS — I10 Essential (primary) hypertension: Secondary | ICD-10-CM

## 2019-08-09 DIAGNOSIS — I82402 Acute embolism and thrombosis of unspecified deep veins of left lower extremity: Secondary | ICD-10-CM | POA: Diagnosis not present

## 2019-08-09 DIAGNOSIS — R413 Other amnesia: Secondary | ICD-10-CM | POA: Diagnosis not present

## 2019-08-09 NOTE — Assessment & Plan Note (Signed)
Stable period.  

## 2019-08-09 NOTE — Progress Notes (Signed)
This visit was conducted in person.  BP (!) 162/72 (BP Location: Right Arm, Cuff Size: Normal)    Pulse (!) 57    Temp 97.8 F (36.6 C) (Temporal)    Ht 5' 9.25" (1.759 m)    Wt 195 lb (88.5 kg)    SpO2 97%    BMI 28.59 kg/m   BP Readings from Last 3 Encounters:  08/09/19 (!) 162/72  08/02/19 133/70  06/10/19 (!) 162/80   CC: ER f/u visit - DVT Subjective:    Patient ID: Shawn Andrews, male    DOB: 09-26-1928, 86 y.o.   MRN: [id number]  HPI: Shawn Andrews is a 86 y.o. male presenting on 08/09/2019 for Hospitalization Follow-up (Seen at [facility] ED on 08/02/19.  Pt accompanied by wife, [name]- temp 97.6.)   Seen at ER on 08/02/2019 for LLE swelling, dx with extensive acute DVT of L femoral vein, popliteal vein, calf vein regions, incompletely obstructing thrombus at L saphenofemoral junction as well - after car ride to [place], started on eliquis. Primodone for tremors was stopped as it interacts with eliquis - saw [facility] and started on Requip 0.5mg  TID with close f/u planned. requip caused insomnia so he's now trying int BID (breakfast and lunch)  Has started using compression stockings.  He's been walking more regularly since DVT  Given extent of DVT, he does have VVS f/u scheduled for later this week.   Denies chest pain, dyspnea, cough.   HTN - Compliant with current antihypertensive regimen of amlodipine 10mg  daily, cardura 4mg  daily, metoprolol 50mg  bid. Last visit we started lisinopril 5mg  daily in addition to other regimen - this worsened cough -> switched to losartan 25mg . Does check blood pressures at home: home readings 409W systolic. No low blood pressure readings or symptoms of dizziness/syncope.  Denies HA, vision changes, CP/tightness, SOB, leg swelling. Does note return of dry cough since being on ACEI.   No personal or family history of blood clots.  1st provoked blood clot.  Upcoming trip to [place] this weekend - they ask about this.   He and wife endorse no  memory concerns for patient.      Relevant past medical, surgical, family and social history reviewed and updated as indicated. Interim medical history since our last visit reviewed. Allergies and medications reviewed and updated. Outpatient Medications Prior to Visit  Medication Sig Dispense Refill   acetaminophen (TYLENOL 8 HOUR) 650 MG CR tablet Take 1 tablet (650 mg total) by mouth 2 (two) times a day.     amLODipine (NORVASC) 10 MG tablet TAKE 1 TABLET BY MOUTH  DAILY (Patient taking differently: Take 10 mg by mouth daily. ) 90 tablet 3   APIXABAN (ELIQUIS) VTE STARTER PACK (10MG  AND 5MG ) Take as directed on package: start with two-5mg  tablets twice daily for 7 days. On day 8, switch to one-5mg  tablet twice daily. 1 each 0   Cholecalciferol (VITAMIN D3) 25 MCG (1000 UT) CAPS Take 1 capsule (1,000 Units total) by mouth daily. 30 capsule    clindamycin (CLEOCIN) 150 MG capsule Take 4 capsules by mouth as directed.     doxazosin (CARDURA) 4 MG tablet TAKE 1 TABLET BY MOUTH  DAILY (Patient taking differently: Take 4 mg by mouth daily. Take 1 tablet by mouth  daily) 90 tablet 3   finasteride (PROSCAR) 5 MG tablet TAKE 1 TABLET BY MOUTH  DAILY (Patient taking differently: Take 5 mg by mouth daily. ) 90 tablet 3   FLAXSEED,  LINSEED, PO Take 1 tablet by mouth 2 (two) times daily.      loratadine (CLARITIN) 10 MG tablet Take 1 tablet (10 mg total) by mouth daily as needed for allergies. 30 tablet    losartan (COZAAR) 25 MG tablet Take 1 tablet (25 mg total) by mouth daily. 30 tablet 9   metoprolol tartrate (LOPRESSOR) 50 MG tablet TAKE 1 TABLET BY MOUTH  TWICE DAILY (Patient taking differently: Take 50 mg by mouth 2 (two) times daily. ) 180 tablet 3   vitamin B-12 (CYANOCOBALAMIN) 500 MCG tablet Take 500 mcg by mouth daily.     No facility-administered medications prior to visit.     Per HPI unless specifically indicated in ROS section below Review of Systems Objective:  BP (!)  162/72 (BP Location: Right Arm, Cuff Size: Normal)    Pulse (!) 57    Temp 97.8 F (36.6 C) (Temporal)    Ht 5' 9.25" (1.759 m)    Wt 195 lb (88.5 kg)    SpO2 97%    BMI 28.59 kg/m   Wt Readings from Last 3 Encounters:  08/09/19 195 lb (88.5 kg)  08/02/19 194 lb 7.1 oz (88.2 kg)  06/10/19 194 lb 6 oz (88.2 kg)      Physical Exam Vitals and nursing note reviewed.  Constitutional:      Appearance: Normal appearance. He is not ill-appearing.  Cardiovascular:     Rate and Rhythm: Normal rate and regular rhythm.     Pulses: Normal pulses.     Heart sounds: Normal heart sounds. No murmur heard.   Pulmonary:     Effort: Pulmonary effort is normal. No respiratory distress.     Breath sounds: Normal breath sounds. No wheezing, rhonchi or rales.  Musculoskeletal:     Right lower leg: No edema.     Left lower leg: Edema present.     Comments: LLE swelling compared to right but improving, compression stockings in place  Skin:    General: Skin is warm and dry.     Findings: No rash.  Neurological:     Mental Status: He is alert.  Psychiatric:        Mood and Affect: Mood normal.        Behavior: Behavior normal.       Results for orders placed or performed during the hospital encounter of 08/02/19  CBC with Differential  Result Value Ref Range   WBC 7.3 4.0 - 10.5 K/uL   RBC 4.04 (L) 4.22 - 5.81 MIL/uL   Hemoglobin 12.1 (L) 13.0 - 17.0 g/dL   HCT 35.2 (L) 39 - 52 %   MCV 87.1 80.0 - 100.0 fL   MCH 30.0 26.0 - 34.0 pg   MCHC 34.4 30.0 - 36.0 g/dL   RDW 12.9 11.5 - 15.5 %   Platelets 157 150 - 400 K/uL   nRBC 0.0 0.0 - 0.2 %   Neutrophils Relative % 65 %   Neutro Abs 4.7 1.7 - 7.7 K/uL   Lymphocytes Relative 24 %   Lymphs Abs 1.7 0.7 - 4.0 K/uL   Monocytes Relative 8 %   Monocytes Absolute 0.6 0 - 1 K/uL   Eosinophils Relative 3 %   Eosinophils Absolute 0.3 0 - 0 K/uL   Basophils Relative 0 %   Basophils Absolute 0.0 0 - 0 K/uL   Immature Granulocytes 0 %   Abs Immature  Granulocytes 0.02 0.00 - 0.07 K/uL  Basic metabolic panel  Result Value  Ref Range   Sodium 138 135 - 145 mmol/L   Potassium 4.9 3.5 - 5.1 mmol/L   Chloride 105 98 - 111 mmol/L   CO2 26 22 - 32 mmol/L   Glucose, Bld 108 (H) 70 - 99 mg/dL   BUN 29 (H) 8 - 23 mg/dL   Creatinine, Ser 1.17 0.61 - 1.24 mg/dL   Calcium 9.0 8.9 - 10.3 mg/dL   GFR calc non Af Amer 55 (L) >60 mL/min   GFR calc Af Amer >60 >60 mL/min   Anion gap 7 5 - 15  Protime-INR  Result Value Ref Range   Prothrombin Time 13.8 11.4 - 15.2 seconds   INR 1.1 0.8 - 1.2   US Venous Img Lower Unilateral Left CLINICAL DATA:  Lower extremity edema  EXAM: LEFT LOWER EXTREMITY VENOUS DUPLEX ULTRASOUND  TECHNIQUE: Gray-scale sonography with graded compression, as well as color Doppler and duplex ultrasound were performed to evaluate the left lower extremity deep venous system from the level of the common femoral vein and including the common femoral, femoral, profunda femoral, popliteal and calf veins including the posterior tibial, peroneal and gastrocnemius veins when visible. The superficial great saphenous vein was also interrogated. Spectral Doppler was utilized to evaluate flow at rest and with distal augmentation maneuvers in the common femoral, femoral and popliteal veins.  COMPARISON:  None.  November 10, 2017  FINDINGS: Contralateral Common Femoral Vein: Respiratory phasicity is normal and symmetric with the symptomatic side. No evidence of thrombus. Normal compressibility.  Common Femoral Vein: No evident thrombus. Normal compressibility and color flow on Doppler imaging.  Saphenofemoral Junction: There is incompletely obstructing thrombus at the left saphenofemoral junction with diminished Doppler signal in this area and diminished compression and augmentation.  Profunda Femoral Vein: No evidence of thrombus. Normal compressibility and flow on color Doppler imaging.  Femoral Vein: There is acute  appearing thrombus throughout the left femoral vein without appreciable augmentation or compression. Significantly diminished flow in this vessel.  Popliteal Vein: There is acute appearing thrombus throughout the popliteal vein without appreciable augmentation or compression. Virtually no appreciable Doppler signal in this vessel.  Calf Veins: Acute appearing thrombus noted throughout the calf vein regions with absent compression augmentation and diminished flow in these vessels.  Superficial Great Saphenous Vein: No evidence of thrombus. Normal compressibility.  Venous Reflux:  None.  Other Findings:  None.  IMPRESSION: Extensive acute appearing deep venous thrombosis in the left femoral vein, popliteal vein, and calf vein regions. There is incompletely obstructing thrombus at the left saphenofemoral junction as well.  Right common femoral vein patent.  Electronically Signed   By: Lowella Grip III M.D.   On: 08/02/2019 09:11  Assessment & Plan:  This visit occurred during the SARS-CoV-2 public health emergency.  Safety protocols were in place, including screening questions prior to the visit, additional usage of staff PPE, and extensive cleaning of exam room while observing appropriate contact time as indicated for disinfecting solutions.   Problem List Items Addressed This Visit    Memory deficit    Stable period.       Leg DVT (deep venous thromboembolism), acute, left (HCC) - Primary    Extensive DVT of left leg, both proximal and distally. Now on eliquis and has VVS f/u planned for later this week. Has started using compression stockings regularly. Discussed risk factors for DVT - and need to take frequent breaks during long car or plane rides from now on. Anticipate will need 3-6 months of  anticoagulation for this first provoked DVT. Will defer to VVS recs.  Will watch mild anemia noted at ER.       Essential tremor    Appreciate [facility] - now trying  requip 0.5mg  BID in place of primodone due to eliquis interaction.       Essential hypertension    Persistently elevated in office today however they endorse improved readings at home - continue current regimen, start monitoring BP more closely at home, let me know if consistently >174 systolic otherwise reassess at 2 wk f/u.  They note return of mild nagging cough even on losartan - but will continue this for now.           No orders of the defined types were placed in this encounter.  No orders of the defined types were placed in this encounter.  Patient Instructions  Continue current medicines including eliquis and losartan. We will watch dry cough for now - may be side effect to losartan (but should be better than on lisinopril).  Keep appointment with Dr Lucky Cowboy.  We will see you in 2 weeks.  Continue watching blood pressure - staying elevated today. Goal is <150/90.     Follow up plan: No follow-ups on file.  Ria Bush, MD  .

## 2019-08-09 NOTE — Assessment & Plan Note (Addendum)
Extensive DVT of left leg, both proximal and distally. Now on eliquis and has VVS f/u planned for later this week. Has started using compression stockings regularly. Discussed risk factors for DVT - and need to take frequent breaks during long car or plane rides from now on. Anticipate will need 3-6 months of anticoagulation for this first provoked DVT. Will defer to VVS recs.  Will watch mild anemia noted at ER.

## 2019-08-09 NOTE — Assessment & Plan Note (Signed)
Appreciate [facility] - now trying requip 0.5mg  BID in place of primodone due to eliquis interaction.

## 2019-08-09 NOTE — Assessment & Plan Note (Addendum)
Persistently elevated in office today however they endorse improved readings at home - continue current regimen, start monitoring BP more closely at home, let me know if consistently >456 systolic otherwise reassess at 2 wk f/u.  They note return of mild nagging cough even on losartan - but will continue this for now.

## 2019-08-09 NOTE — Telephone Encounter (Signed)
plz call patient- saw he was seen at ER last week - dx with DVT started on eliquis.  primodone for tremors was stopped, I see he saw neurology and started on requip.  plz ensure doing on regarding tremor as well as tolerating eliquis and leg is doing well.  Keep appt early August if doing well.   Actually - I see he has appt today - will touch base at that time.

## 2019-08-09 NOTE — Patient Instructions (Addendum)
Continue current medicines including eliquis and losartan. We will watch dry cough for now - may be side effect to losartan (but should be better than on lisinopril).  Keep appointment with Dr Lucky Cowboy.  We will see you in 2 weeks.  Continue watching blood pressure - staying elevated today. Goal is <150/90.

## 2019-08-11 ENCOUNTER — Other Ambulatory Visit (INDEPENDENT_AMBULATORY_CARE_PROVIDER_SITE_OTHER): Payer: Self-pay | Admitting: Vascular Surgery

## 2019-08-11 DIAGNOSIS — I82412 Acute embolism and thrombosis of left femoral vein: Secondary | ICD-10-CM

## 2019-08-12 ENCOUNTER — Other Ambulatory Visit: Payer: Self-pay

## 2019-08-12 ENCOUNTER — Encounter (INDEPENDENT_AMBULATORY_CARE_PROVIDER_SITE_OTHER): Payer: Self-pay | Admitting: Nurse Practitioner

## 2019-08-12 ENCOUNTER — Ambulatory Visit (INDEPENDENT_AMBULATORY_CARE_PROVIDER_SITE_OTHER): Payer: Medicare Other

## 2019-08-12 ENCOUNTER — Ambulatory Visit (INDEPENDENT_AMBULATORY_CARE_PROVIDER_SITE_OTHER): Payer: Medicare Other | Admitting: Nurse Practitioner

## 2019-08-12 VITALS — BP 152/69 | HR 76 | Resp 16 | Wt 192.6 lb

## 2019-08-12 DIAGNOSIS — E785 Hyperlipidemia, unspecified: Secondary | ICD-10-CM

## 2019-08-12 DIAGNOSIS — I82412 Acute embolism and thrombosis of left femoral vein: Secondary | ICD-10-CM | POA: Diagnosis not present

## 2019-08-12 DIAGNOSIS — I10 Essential (primary) hypertension: Secondary | ICD-10-CM

## 2019-08-12 DIAGNOSIS — I82402 Acute embolism and thrombosis of unspecified deep veins of left lower extremity: Secondary | ICD-10-CM | POA: Diagnosis not present

## 2019-08-16 ENCOUNTER — Encounter (INDEPENDENT_AMBULATORY_CARE_PROVIDER_SITE_OTHER): Payer: Self-pay | Admitting: Nurse Practitioner

## 2019-08-16 MED ORDER — APIXABAN 5 MG PO TABS: 5.0000 mg | ORAL_TABLET | Freq: Two times a day (BID) | ORAL | 3 refills | Status: DC

## 2019-08-16 NOTE — Progress Notes (Signed)
Subjective:    Patient ID: Shawn Andrews, male    DOB: [birth year], 86 y.o.   MRN: [id number] No chief complaint on file.   The patient presents today after initially having a DVT diagnosed on 08/02/2019.  The patient returned home from a trip to [place] notes that his leg was suddenly swollen the next morning.  The patient drove all the way to [facility] and was a passenger on the return trip.  The next day his calf was swollen and painful.  This is the patient's first DVT.  However he has not truly had any issues with pain since the initial diagnosis.  He the patient started Eliquis without any issue.  The biggest issue is had is because he had to stop his primidone and changed to Requip and so his sleep is affected.  He denies any chest pain or shortness of breath.  Denies any TIA or amaurosis fugax.  He denies any fever, chills, nausea, vomiting or diarrhea.  The study today shows findings consistent with an acute DVT in the left popliteal vein and distal femoral vein.  This is actually improved from the previous studies on 08/02/2019.  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   Review of Systems  Cardiovascular: Positive for leg swelling.  All other systems reviewed and are negative.      Objective:   Physical Exam Vitals reviewed.  HENT:     Head: Normocephalic.  Cardiovascular:     Rate and Rhythm: Normal rate and regular rhythm.     Pulses: Normal pulses.  Pulmonary:     Effort: Pulmonary effort is normal.  Musculoskeletal:     Left lower leg: Edema present.  Skin:    General: Skin is warm and dry.  Neurological:     Mental Status: He is alert.  Psychiatric:        Mood and Affect: Mood normal.        Behavior: Behavior normal.        Thought Content: Thought content normal.        Judgment: Judgment normal.     BP (!) 152/69 (BP Location: Right Arm)   Pulse 76   Resp 16   Wt 192 lb 9.6 oz (87.4 kg)   BMI 28.24 kg/m   Past  Medical History:  Diagnosis Date  . Abnormal drug screen 06/2014   inapprop neg xanax rpt 3 mo (06/2014)  . BPH (benign prostatic hypertrophy) 01/21/98   has had 3 biopsies in past ([facility]) decided to stop PSA/DRE  . Hyperlipidemia 01/21/02  . Hypertension 05/22/03  . Left lumbar radiculopathy   . Osteoarthritis 01/22/88   knees, lumbar spondylosis and listhesis    Social History   Socioeconomic History  . Marital status: Married    Spouse name: Not on file  . Number of children: Not on file  . Years of education: Not on file  . Highest education level: Not on file  Occupational History  . Occupation: retired Event organiser, Facilities manager, owns lawn care business  Tobacco Use  . Smoking status: Former Smoker    Packs/day: 0.00    Years: 0.00    Pack years: 0.00    Types: Pipe    Quit date: 12/26/2000    Years since quitting: 18.6  . Smokeless tobacco: Current User    Types: Chew  . Tobacco comment: quit over 30 years ago  Vaping Use  . Vaping Use: Never  used  Substance and Sexual Activity  . Alcohol use: Yes    Alcohol/week: 0.0 standard drinks    Comment: Occasional beer  . Drug use: No  . Sexual activity: Not on file  Other Topics Concern  . Not on file  Social History Narrative   Widowed, remarried 07/26/07.   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 Difficulty of Paying Living Expenses:   Food Insecurity:   . Worried About Charity fundraiser in the Last Year:   . Arboriculturist in the Last Year:   Transportation Needs:   . Film/video editor (Medical):   Marland Kitchen Lack of Transportation (Non-Medical):   Physical Activity:   . Days of Exercise per Week:   . Minutes of Exercise per Session:   Stress:   . Feeling of Stress  :   Social Connections:   . Frequency of Communication with Friends and Family:   . Frequency of Social Gatherings with Friends and Family:   . Attends Religious Services:   . Active Member of Clubs or Organizations:   . Attends Archivist Meetings:   Marland Kitchen Marital Status:   Intimate Partner Violence:   . Fear of Current or Ex-Partner:   . Emotionally Abused:   Marland Kitchen Physically Abused:   . Sexually Abused:     Past Surgical History:  Procedure Laterality Date  . CATARACT EXTRACTION  2013   bilateral  . ESI Left 08/2014, 11/2014, 08/2015   L S1, L L5/S1 transforaminal ESI; L4/5 L5/S1 zygapophysial injections, L S1 transforaminal ([facility])  . FINGER SURGERY     right middle, partial traumatic amputation  . KNEE ARTHROSCOPY  1990   right  . KNEE SURGERY  04/2006   right partial knee replacement in [place] - rec ppx abx for any invasive procedure    Family History  Problem Relation Age of Onset  . Hypertension Mother   . Heart disease Brother 64       MI  . Diabetes Brother   . Cancer Daughter        melanoma  . Stroke Neg Hx     Allergies  Allergen Reactions  . Penicillins Swelling    Of tongue Has patient had a PCN reaction causing immediate rash, facial/tongue/throat swelling, SOB or lightheadedness with hypotension: Yes Has patient had a PCN reaction causing severe rash involving mucus membranes or skin necrosis: No Has patient had a PCN reaction that required hospitalization: No Has patient had a PCN reaction occurring within the last 10 years: No If all of the above answers are "NO", then may proceed with Cephalosporin use.  . Cialis [Tadalafil] Other (See Comments)    Indigestion and "felt like crap"  . Lipitor [Atorvastatin] Other (See Comments)    Severe myalgias  . Lisinopril Cough    Dry nagging cough       Assessment & Plan:   1. Leg DVT (deep venous thromboembolism), acute, left (HCC) Recommend:   No surgery or intervention at this point in time.   IVC filter is not indicated at present.  Patient's duplex ultrasound of the venous system shows DVT from the popliteal to the distal  femoral veins.  The patient is initiated on anticoagulation in the emergency  room.  The patient will continue with anticoagulation for 3 months as this was a clearly provoked DVT.  Elevation was stressed, use of a recliner was discussed.  I have had a long discussion with the patient regarding DVT and post phlebitic changes such as swelling and why it  causes symptoms such as pain.  The patient will wear graduated compression stockings class 1 (20-30 mmHg), beginning after three full days of anticoagulation, on a daily basis a prescription was given. The patient will  beginning wearing the stockings first thing in the morning and removing them in the evening. The patient is instructed specifically not to sleep in the stockings.  In addition, behavioral modification including elevation during the day and avoidance of prolonged dependency will be initiated.    The patient will continue anticoagulation for now as there have not been any problems or complications at this point.    2. Hyperlipidemia, unspecified hyperlipidemia type Continue statin as ordered and reviewed, no changes at this time   3. Essential hypertension Continue antihypertensive medications as already ordered, these medications have been reviewed and there are no changes at this time.    Current Outpatient Medications on File Prior to Visit  Medication Sig Dispense Refill  . acetaminophen (TYLENOL 8 HOUR) 650 MG CR tablet Take 1 tablet (650 mg total) by mouth 2 (two) times a day.    Marland Kitchen amLODipine (NORVASC) 10 MG tablet TAKE 1 TABLET BY MOUTH  DAILY (Patient taking differently: Take 10 mg by mouth daily. ) 90 tablet 3  . APIXABAN (ELIQUIS) VTE STARTER PACK (10MG  AND 5MG ) Take as directed on package: start with two-5mg  tablets twice daily for 7 days. On day 8, switch to one-5mg  tablet twice  daily. 1 each 0  . Cholecalciferol (VITAMIN D3) 25 MCG (1000 UT) CAPS Take 1 capsule (1,000 Units total) by mouth daily. 30 capsule   . clindamycin (CLEOCIN) 150 MG capsule Take 4 capsules by mouth as directed.    . doxazosin (CARDURA) 4 MG tablet TAKE 1 TABLET BY MOUTH  DAILY (Patient taking differently: Take 4 mg by mouth daily. Take 1 tablet by mouth  daily) 90 tablet 3  . finasteride (PROSCAR) 5 MG tablet TAKE 1 TABLET BY MOUTH  DAILY (Patient taking differently: Take 5 mg by mouth daily. ) 90 tablet 3  . FLAXSEED, LINSEED, PO Take 1 tablet by mouth 2 (two) times daily.     Marland Kitchen loratadine (CLARITIN) 10 MG tablet Take 1 tablet (10 mg total) by mouth daily as needed for allergies. 30 tablet   . losartan (COZAAR) 25 MG tablet Take 1 tablet (25 mg total) by mouth daily. 30 tablet 9  . metoprolol tartrate (LOPRESSOR) 50 MG tablet TAKE 1 TABLET BY MOUTH  TWICE DAILY (Patient taking differently: Take 50 mg by mouth 2 (two) times daily. ) 180 tablet 3  . rOPINIRole (REQUIP) 0.5 MG tablet Take 0.5 mg by mouth 3 (three) times daily.    . vitamin B-12 (CYANOCOBALAMIN) 500 MCG tablet Take 500 mcg by mouth daily.     No current facility-administered medications on file prior to visit.    There are no Patient Instructions on file for this visit. No follow-ups on file.   Kris Hartmann, NP

## 2019-08-25 ENCOUNTER — Ambulatory Visit (INDEPENDENT_AMBULATORY_CARE_PROVIDER_SITE_OTHER): Payer: Medicare Other | Admitting: Family Medicine

## 2019-08-25 ENCOUNTER — Encounter: Payer: Self-pay | Admitting: Family Medicine

## 2019-08-25 ENCOUNTER — Other Ambulatory Visit: Payer: Self-pay

## 2019-08-25 VITALS — BP 128/62 | HR 55 | Temp 97.6°F | Ht 69.25 in | Wt 189.0 lb

## 2019-08-25 DIAGNOSIS — Z0000 Encounter for general adult medical examination without abnormal findings: Secondary | ICD-10-CM

## 2019-08-25 DIAGNOSIS — H9193 Unspecified hearing loss, bilateral: Secondary | ICD-10-CM

## 2019-08-25 DIAGNOSIS — N1831 Chronic kidney disease, stage 3a: Secondary | ICD-10-CM | POA: Diagnosis not present

## 2019-08-25 DIAGNOSIS — E559 Vitamin D deficiency, unspecified: Secondary | ICD-10-CM | POA: Diagnosis not present

## 2019-08-25 DIAGNOSIS — R413 Other amnesia: Secondary | ICD-10-CM

## 2019-08-25 DIAGNOSIS — E785 Hyperlipidemia, unspecified: Secondary | ICD-10-CM | POA: Diagnosis not present

## 2019-08-25 DIAGNOSIS — G250 Essential tremor: Secondary | ICD-10-CM

## 2019-08-25 DIAGNOSIS — I82402 Acute embolism and thrombosis of unspecified deep veins of left lower extremity: Secondary | ICD-10-CM

## 2019-08-25 DIAGNOSIS — I10 Essential (primary) hypertension: Secondary | ICD-10-CM

## 2019-08-25 DIAGNOSIS — N400 Enlarged prostate without lower urinary tract symptoms: Secondary | ICD-10-CM

## 2019-08-25 DIAGNOSIS — D649 Anemia, unspecified: Secondary | ICD-10-CM

## 2019-08-25 LAB — TSH: TSH: 2.28 u[IU]/mL (ref 0.35–4.50)

## 2019-08-25 LAB — CBC WITH DIFFERENTIAL/PLATELET
Basophils Absolute: 0 10*3/uL (ref 0.0–0.1)
Basophils Relative: 0.3 % (ref 0.0–3.0)
Eosinophils Absolute: 0.3 10*3/uL (ref 0.0–0.7)
Eosinophils Relative: 2.5 % (ref 0.0–5.0)
HCT: 37.6 % — ABNORMAL LOW (ref 39.0–52.0)
Hemoglobin: 13 g/dL (ref 13.0–17.0)
Lymphocytes Relative: 18 % (ref 12.0–46.0)
Lymphs Abs: 2 10*3/uL (ref 0.7–4.0)
MCHC: 34.7 g/dL (ref 30.0–36.0)
MCV: 86.9 fl (ref 78.0–100.0)
Monocytes Absolute: 0.6 10*3/uL (ref 0.1–1.0)
Monocytes Relative: 5.5 % (ref 3.0–12.0)
Neutro Abs: 8 10*3/uL — ABNORMAL HIGH (ref 1.4–7.7)
Neutrophils Relative %: 73.7 % (ref 43.0–77.0)
Platelets: 226 10*3/uL (ref 150.0–400.0)
RBC: 4.32 Mil/uL (ref 4.22–5.81)
RDW: 13.6 % (ref 11.5–15.5)
WBC: 10.9 10*3/uL — ABNORMAL HIGH (ref 4.0–10.5)

## 2019-08-25 LAB — BASIC METABOLIC PANEL
BUN: 26 mg/dL — ABNORMAL HIGH (ref 6–23)
CO2: 29 mEq/L (ref 19–32)
Calcium: 9.6 mg/dL (ref 8.4–10.5)
Chloride: 103 mEq/L (ref 96–112)
Creatinine, Ser: 1.28 mg/dL (ref 0.40–1.50)
GFR: 52.88 mL/min — ABNORMAL LOW (ref >60.00)
Glucose, Bld: 126 mg/dL — ABNORMAL HIGH (ref 70–99)
Potassium: 4.7 mEq/L (ref 3.5–5.1)
Sodium: 138 mEq/L (ref 135–145)

## 2019-08-25 LAB — LIPID PANEL
Cholesterol: 202 mg/dL — ABNORMAL HIGH (ref 0–200)
HDL: 45.8 mg/dL (ref >39.00)
LDL Cholesterol: 134 mg/dL — ABNORMAL HIGH (ref 0–99)
NonHDL: 155.82
Total CHOL/HDL Ratio: 4
Triglycerides: 110 mg/dL (ref 0.0–149.0)
VLDL: 22 mg/dL (ref 0.0–40.0)

## 2019-08-25 LAB — HEPATIC FUNCTION PANEL
ALT: 9 U/L (ref 0–53)
AST: 12 U/L (ref 0–37)
Albumin: 4.4 g/dL (ref 3.5–5.2)
Alkaline Phosphatase: 62 U/L (ref 39–117)
Bilirubin, Direct: 0.1 mg/dL (ref 0.0–0.3)
Total Bilirubin: 0.7 mg/dL (ref 0.2–1.2)
Total Protein: 6.4 g/dL (ref 6.0–8.3)

## 2019-08-25 LAB — IBC PANEL
Iron: 64 ug/dL (ref 42–165)
Saturation Ratios: 19.5 % — ABNORMAL LOW (ref 20.0–50.0)
Transferrin: 234 mg/dL (ref 212.0–360.0)

## 2019-08-25 LAB — VITAMIN B12: Vitamin B-12: 1377 pg/mL — ABNORMAL HIGH (ref 211–911)

## 2019-08-25 LAB — FOLATE: Folate: 12.7 ng/mL (ref >5.9)

## 2019-08-25 LAB — FERRITIN: Ferritin: 149.2 ng/mL (ref 22.0–322.0)

## 2019-08-25 LAB — VITAMIN D 25 HYDROXY (VIT D DEFICIENCY, FRACTURES): VITD: 41.95 ng/mL (ref 30.00–100.00)

## 2019-08-25 NOTE — Assessment & Plan Note (Signed)

## 2019-08-25 NOTE — Assessment & Plan Note (Signed)
Update labs on 1000 IU replacement daily.

## 2019-08-25 NOTE — Progress Notes (Signed)
This visit was conducted in person.  BP 128/62 (BP Location: Left Arm, Patient Position: Sitting, Cuff Size: Normal)   Pulse (!) 55   Temp 97.6 F (36.4 C) (Temporal)   Ht 5' 9.25" (1.759 m)   Wt 189 lb (85.7 kg)   SpO2 95%   BMI 27.71 kg/m    CC: AMW/CPE Subjective:    Patient ID: Shawn Andrews, male    DOB: [birth year], 86 y.o.   MRN: [id number]  HPI: Shawn Andrews is a 86 y.o. male presenting on 08/25/2019 for Medicare Wellness   Did not see health advisor.              Hearing Screening   125Hz  250Hz  500Hz  1000Hz  2000Hz  3000Hz  4000Hz  6000Hz  8000Hz   Right ear:           Left ear:           Comments: Pt wears bilateral hearing aids.  Wearing at today's visit.         Visual Acuity Screening   Right eye Left eye Both eyes  Without correction:     With correction: 20/40 20/25 20/25       Office Visit from 08/25/2019 in [place] at [facility] Total Score 0      Fall Risk  08/25/2019 08/25/2018 07/29/2017 07/19/2016 07/10/2015  Falls in the past year? 0 0 No No No   New hearing aides.   Sees neurology for essential tremor. L handed. Primodone started2019- this has significantly helped.   Recent dx extensive LLE DVT (L femoral, popliteal, calf veins, incompletely obstructing thrombus at L saphenofemoral junction) at ER treated with eliquis. Primidone changed to requip given drug-drug interaction. Saw VVS given extent of DVT - no surgery or IVC filter recommended. Planned 3 months of anticoagulant for first episode of provoked DVT. rec regular compression stocking use - he is good with this, also walking 30 min several times a day. Worsening tremor off primodone - wants to restart once eliquis course completed.   Preventative: Colon cancer screening - aged out  Prostate screening - BPH - has had elevated PSA in past but nomal biopsies. On cardura and finasteride. Denies voiding difficulty. We decided to stop PSA/DRE checks 2015. Flu  shot yearly  Td 2003  Pneumovax 2012. Prevnar 2015  COVID vaccine - completed Pfizer series 05/2019 zostavax - discussed, declines. H/o shingles x2 in past. shingrix - discussed, declines  Advanced directives: scanned 07/2013 and in chart. Son is HCPOA Therapist, occupational [place]). Doesn't want prolonged life support.  Seat belt use discussed. Sunscreen use discussed. No changing moles on skin.Sees dermregularly.Fmhx melanoma. Ex smoker - quit 2002. Some chewing tobacco  Alcohol - seldom  Dentist - Q5mo  Eye exam - sees yearly Bowel - no constipation Bladder - no incontinence  Widowed, remarried Jul 17, 2007. Daughter deceased from metastatic melanoma  Lives at [facility] Activity: walks 1.5 miles 3d/wk, [place] [facility] cardio and weight training Diet: good water, fruits/vegetables daily     Relevant past medical, surgical, family and social history reviewed and updated as indicated. Interim medical history since our last visit reviewed. Allergies and medications reviewed and updated. Outpatient Medications Prior to Visit  Medication Sig Dispense Refill  . acetaminophen (TYLENOL 8 HOUR) 650 MG CR tablet Take 1 tablet (650 mg total) by mouth 2 (two) times a day.    Marland Kitchen amLODipine (NORVASC) 10 MG tablet TAKE 1 TABLET BY MOUTH  DAILY (Patient taking differently: Take 10  mg by mouth daily. ) 90 tablet 3  . apixaban (ELIQUIS) 5 MG TABS tablet Take 1 tablet (5 mg total) by mouth 2 (two) times daily. 60 tablet 3  . Cholecalciferol (VITAMIN D3) 25 MCG (1000 UT) CAPS Take 1 capsule (1,000 Units total) by mouth daily. 30 capsule   . clindamycin (CLEOCIN) 150 MG capsule Take 4 capsules by mouth as directed.    . doxazosin (CARDURA) 4 MG tablet TAKE 1 TABLET BY MOUTH  DAILY (Patient taking differently: Take 4 mg by mouth daily. Take 1 tablet by mouth  daily) 90 tablet 3  . finasteride (PROSCAR) 5 MG tablet TAKE 1 TABLET BY MOUTH  DAILY (Patient taking differently: Take 5 mg by mouth daily.  ) 90 tablet 3  . FLAXSEED, LINSEED, PO Take 1 tablet by mouth 2 (two) times daily.     Marland Kitchen loratadine (CLARITIN) 10 MG tablet Take 1 tablet (10 mg total) by mouth daily as needed for allergies. 30 tablet   . losartan (COZAAR) 25 MG tablet Take 1 tablet (25 mg total) by mouth daily. 30 tablet 9  . metoprolol tartrate (LOPRESSOR) 50 MG tablet TAKE 1 TABLET BY MOUTH  TWICE DAILY (Patient taking differently: Take 50 mg by mouth 2 (two) times daily. ) 180 tablet 3  . rOPINIRole (REQUIP) 0.5 MG tablet Take 1 tablet (0.5 mg total) by mouth in the morning and at bedtime.    . vitamin B-12 (CYANOCOBALAMIN) 500 MCG tablet Take 500 mcg by mouth daily.    Marland Kitchen rOPINIRole (REQUIP) 0.5 MG tablet Take 0.5 mg by mouth 3 (three) times daily. Takes 2 tablets twice a day.    . APIXABAN (ELIQUIS) VTE STARTER PACK (10MG  AND 5MG ) Take as directed on package: start with two-5mg  tablets twice daily for 7 days. On day 8, switch to one-5mg  tablet twice daily. 1 each 0   No facility-administered medications prior to visit.     Per HPI unless specifically indicated in ROS section below Review of Systems  Constitutional: Negative for activity change, appetite change, chills, fatigue, fever and unexpected weight change.  HENT: Negative for hearing loss.   Eyes: Negative for visual disturbance.  Respiratory: Negative for cough, chest tightness, shortness of breath and wheezing.   Cardiovascular: Positive for leg swelling (recent LLE DVT). Negative for chest pain and palpitations.  Gastrointestinal: Negative for abdominal distention, abdominal pain, blood in stool, constipation, diarrhea, nausea and vomiting.  Genitourinary: Negative for difficulty urinating and hematuria.  Musculoskeletal: Negative for arthralgias, myalgias and neck pain.  Skin: Negative for rash.  Neurological: Negative for dizziness, seizures, syncope and headaches.  Hematological: Negative for adenopathy. Bruises/bleeds easily (eliquis related).    Psychiatric/Behavioral: Negative for dysphoric mood. The patient is not nervous/anxious.    Objective:  BP 128/62 (BP Location: Left Arm, Patient Position: Sitting, Cuff Size: Normal)   Pulse (!) 55   Temp 97.6 F (36.4 C) (Temporal)   Ht 5' 9.25" (1.759 m)   Wt 189 lb (85.7 kg)   SpO2 95%   BMI 27.71 kg/m   Wt Readings from Last 3 Encounters:  08/25/19 189 lb (85.7 kg)  08/12/19 192 lb 9.6 oz (87.4 kg)  08/09/19 195 lb (88.5 kg)      Physical Exam Vitals and nursing note reviewed.  Constitutional:      General: He is not in acute distress.    Appearance: Normal appearance. He is well-developed. He is not ill-appearing.  HENT:     Head: Normocephalic and atraumatic.  Right Ear: Hearing, tympanic membrane, ear canal and external ear normal.     Left Ear: Hearing, tympanic membrane, ear canal and external ear normal.  Eyes:     General: No scleral icterus.    Extraocular Movements: Extraocular movements intact.     Conjunctiva/sclera: Conjunctivae normal.     Pupils: Pupils are equal, round, and reactive to light.  Neck:     Thyroid: No thyroid mass, thyromegaly or thyroid tenderness.     Vascular: No carotid bruit.  Cardiovascular:     Rate and Rhythm: Normal rate and regular rhythm.     Pulses: Normal pulses.          Radial pulses are 2+ on the right side and 2+ on the left side.     Heart sounds: Normal heart sounds. No murmur heard.   Pulmonary:     Effort: Pulmonary effort is normal. No respiratory distress.     Breath sounds: Normal breath sounds. No wheezing, rhonchi or rales.  Abdominal:     General: Abdomen is flat. Bowel sounds are normal. There is no distension.     Palpations: Abdomen is soft. There is no mass.     Tenderness: There is no abdominal tenderness. There is no guarding or rebound.     Hernia: No hernia is present.  Musculoskeletal:        General: Normal range of motion.     Cervical back: Normal range of motion and neck supple.      Right lower leg: No edema.     Left lower leg: No edema.  Lymphadenopathy:     Cervical: No cervical adenopathy.  Skin:    General: Skin is warm and dry.     Findings: No rash.  Neurological:     General: No focal deficit present.     Mental Status: He is alert and oriented to person, place, and time.     Comments:  CN grossly intact, station and gait intact Recall 1/3, 2/3 with cue Calculation 3/5 DLORW  Psychiatric:        Mood and Affect: Mood normal.        Behavior: Behavior normal.        Thought Content: Thought content normal.        Judgment: Judgment normal.       Results for orders placed or performed during the hospital encounter of 08/02/19  CBC with Differential  Result Value Ref Range   WBC 7.3 4.0 - 10.5 K/uL   RBC 4.04 (L) 4.22 - 5.81 MIL/uL   Hemoglobin 12.1 (L) 13.0 - 17.0 g/dL   HCT 35.2 (L) 39 - 52 %   MCV 87.1 80.0 - 100.0 fL   MCH 30.0 26.0 - 34.0 pg   MCHC 34.4 30.0 - 36.0 g/dL   RDW 12.9 11.5 - 15.5 %   Platelets 157 150 - 400 K/uL   nRBC 0.0 0.0 - 0.2 %   Neutrophils Relative % 65 %   Neutro Abs 4.7 1.7 - 7.7 K/uL   Lymphocytes Relative 24 %   Lymphs Abs 1.7 0.7 - 4.0 K/uL   Monocytes Relative 8 %   Monocytes Absolute 0.6 0 - 1 K/uL   Eosinophils Relative 3 %   Eosinophils Absolute 0.3 0 - 0 K/uL   Basophils Relative 0 %   Basophils Absolute 0.0 0 - 0 K/uL   Immature Granulocytes 0 %   Abs Immature Granulocytes 0.02 0.00 - 0.07 K/uL  Basic metabolic panel  Result Value Ref Range   Sodium 138 135 - 145 mmol/L   Potassium 4.9 3.5 - 5.1 mmol/L   Chloride 105 98 - 111 mmol/L   CO2 26 22 - 32 mmol/L   Glucose, Bld 108 (H) 70 - 99 mg/dL   BUN 29 (H) 8 - 23 mg/dL   Creatinine, Ser 1.17 0.61 - 1.24 mg/dL   Calcium 9.0 8.9 - 10.3 mg/dL   GFR calc non Af Amer 55 (L) >60 mL/min   GFR calc Af Amer >60 >60 mL/min   Anion gap 7 5 - 15  Protime-INR  Result Value Ref Range   Prothrombin Time 13.8 11.4 - 15.2 seconds   INR 1.1 0.8 - 1.2    Assessment & Plan:  This visit occurred during the SARS-CoV-2 public health emergency.  Safety protocols were in place, including screening questions prior to the visit, additional usage of staff PPE, and extensive cleaning of exam room while observing appropriate contact time as indicated for disinfecting solutions.   Problem List Items Addressed This Visit    Vitamin D deficiency    Update labs on 1000 IU replacement daily.      Relevant Orders   VITAMIN D 25 Hydroxy (Vit-D Deficiency, Fractures)   Memory deficit    Some trouble with memory noted today on limited testing. Encouraged healthy lifestyle/diet to keep active mind.       Medicare annual wellness visit, subsequent - Primary    I have personally reviewed the Medicare Annual Wellness questionnaire and have noted 1. The patient's medical and social history 2. Their use of alcohol, tobacco or illicit drugs 3. Their current medications and supplements 4. The patient's functional ability including ADL's, fall risks, home safety risks and hearing or visual impairment. Cognitive function has been assessed and addressed as indicated.  5. Diet and physical activity 6. Evidence for depression or mood disorders The patients weight, height, BMI have been recorded in the chart. I have made referrals, counseling and provided education to the patient based on review of the above and I have provided the pt with a written personalized care plan for preventive services. Provider list updated.. See scanned questionairre as needed for further documentation. Reviewed preventative protocols and updated unless pt declined.       Leg DVT (deep venous thromboembolism), acute, left (HCC)    Saw VVS, rec continue compression stocking use. Planned eliquis for 3 months total given first provoked DVT.       HLD (hyperlipidemia)    Chronic, stable off statin. Update FLP as he's fasting today.       Relevant Orders   Hepatic function panel    Lipid panel   TSH   High frequency hearing loss    Continues hearing aides.  Ear irrigation performed today due to cerumen impaction bilaterally.      Health maintenance examination    Preventative protocols reviewed and updated unless pt declined. Discussed healthy diet and lifestyle.       Essential tremor    Deteriorated off primodone and on requip - hopeful to restart primodone once he's completed 3 mo eliquis course.       Essential hypertension    Chronic, stable. Continue current regimen.       CKD (chronic kidney disease) stage 3, GFR 30-59 ml/min    Update labs.       Relevant Orders   TSH   CBC with Differential/Platelet   VITAMIN D 25 Hydroxy (Vit-D Deficiency, Fractures)  Basic metabolic panel   Benign prostatic hyperplasia    Stable period on proscar/doxazosin without endorsed orthostatic dizziness.        Other Visit Diagnoses    Anemia, unspecified type       Relevant Orders   CBC with Differential/Platelet   Vitamin B12   Ferritin   IBC panel   Folate       No orders of the defined types were placed in this encounter.  Orders Placed This Encounter  Procedures  . Hepatic function panel  . Lipid panel  . TSH  . CBC with Differential/Platelet  . Vitamin B12  . Ferritin  . IBC panel  . Folate  . VITAMIN D 25 Hydroxy (Vit-D Deficiency, Fractures)  . Basic metabolic panel    Patient instructions: Ear irrigation today  You are doing well today Continue healthy nutritious diet, regular aerobic exercise, regular reading and word and math puzzles, and social engagement to keep brain healthy.  Return as needed or in 1 year for next wellness visit/physical.   Follow up plan: Return in about 1 year (around 08/24/2020) for medicare wellness visit, annual exam, prior fasting for blood work.  Ria Bush, MD

## 2019-08-25 NOTE — Assessment & Plan Note (Signed)
Chronic, stable off statin. Update FLP as he's fasting today.

## 2019-08-25 NOTE — Assessment & Plan Note (Signed)
Saw VVS, rec continue compression stocking use. Planned eliquis for 3 months total given first provoked DVT.

## 2019-08-25 NOTE — Assessment & Plan Note (Signed)
Some trouble with memory noted today on limited testing. Encouraged healthy lifestyle/diet to keep active mind.

## 2019-08-25 NOTE — Assessment & Plan Note (Signed)
Deteriorated off primodone and on requip - hopeful to restart primodone once he's completed 3 mo eliquis course.

## 2019-08-25 NOTE — Assessment & Plan Note (Signed)
Update labs.  

## 2019-08-25 NOTE — Assessment & Plan Note (Signed)
Stable period on proscar/doxazosin without endorsed orthostatic dizziness.

## 2019-08-25 NOTE — Assessment & Plan Note (Signed)
Continues hearing aides.  Ear irrigation performed today due to cerumen impaction bilaterally.

## 2019-08-25 NOTE — Assessment & Plan Note (Signed)
Preventative protocols reviewed and updated unless pt declined. Discussed healthy diet and lifestyle.  

## 2019-08-25 NOTE — Patient Instructions (Addendum)
Ear irrigation today  You are doing well today Continue healthy nutritious diet, regular aerobic exercise, regular reading and word and math puzzles, and social engagement to keep brain healthy.  Return as needed or in 1 year for next wellness visit/physical.   Health Maintenance After Age 84 After age 55, you are at a higher risk for certain long-term diseases and infections as well as injuries from falls. Falls are a major cause of broken bones and head injuries in people who are older than age 84. Getting regular preventive care can help to keep you healthy and well. Preventive care includes getting regular testing and making lifestyle changes as recommended by your health care provider. Talk with your health care provider about:  Which screenings and tests you should have. A screening is a test that checks for a disease when you have no symptoms.  A diet and exercise plan that is right for you. What should I know about screenings and tests to prevent falls? Screening and testing are the best ways to find a health problem early. Early diagnosis and treatment give you the best chance of managing medical conditions that are common after age 4. Certain conditions and lifestyle choices may make you more likely to have a fall. Your health care provider may recommend:  Regular vision checks. Poor vision and conditions such as cataracts can make you more likely to have a fall. If you wear glasses, make sure to get your prescription updated if your vision changes.  Medicine review. Work with your health care provider to regularly review all of the medicines you are taking, including over-the-counter medicines. Ask your health care provider about any side effects that may make you more likely to have a fall. Tell your health care provider if any medicines that you take make you feel dizzy or sleepy.  Osteoporosis screening. Osteoporosis is a condition that causes the bones to get weaker. This can make  the bones weak and cause them to break more easily.  Blood pressure screening. Blood pressure changes and medicines to control blood pressure can make you feel dizzy.  Strength and balance checks. Your health care provider may recommend certain tests to check your strength and balance while standing, walking, or changing positions.  Foot health exam. Foot pain and numbness, as well as not wearing proper footwear, can make you more likely to have a fall.  Depression screening. You may be more likely to have a fall if you have a fear of falling, feel emotionally low, or feel unable to do activities that you used to do.  Alcohol use screening. Using too much alcohol can affect your balance and may make you more likely to have a fall. What actions can I take to lower my risk of falls? General instructions  Talk with your health care provider about your risks for falling. Tell your health care provider if: ? You fall. Be sure to tell your health care provider about all falls, even ones that seem minor. ? You feel dizzy, sleepy, or off-balance.  Take over-the-counter and prescription medicines only as told by your health care provider. These include any supplements.  Eat a healthy diet and maintain a healthy weight. A healthy diet includes low-fat dairy products, low-fat (lean) meats, and fiber from whole grains, beans, and lots of fruits and vegetables. Home safety  Remove any tripping hazards, such as rugs, cords, and clutter.  Install safety equipment such as grab bars in bathrooms and safety rails on stairs.  Keep rooms and walkways well-lit. Activity   Follow a regular exercise program to stay fit. This will help you maintain your balance. Ask your health care provider what types of exercise are appropriate for you.  If you need a cane or walker, use it as recommended by your health care provider.  Wear supportive shoes that have nonskid soles. Lifestyle  Do not drink alcohol if  your health care provider tells you not to drink.  If you drink alcohol, limit how much you have: ? 0-1 drink a day for women. ? 0-2 drinks a day for men.  Be aware of how much alcohol is in your drink. In the [place] one drink equals one typical bottle of beer (12 oz), one-half glass of wine (5 oz), or one shot of hard liquor (1 oz).  Do not use any products that contain nicotine or tobacco, such as cigarettes and e-cigarettes. If you need help quitting, ask your health care provider. Summary  Having a healthy lifestyle and getting preventive care can help to protect your health and wellness after age 21.  Screening and testing are the best way to find a health problem early and help you avoid having a fall. Early diagnosis and treatment give you the best chance for managing medical conditions that are more common for people who are older than age 60.  Falls are a major cause of broken bones and head injuries in people who are older than age 54. Take precautions to prevent a fall at home.  Work with your health care provider to learn what changes you can make to improve your health and wellness and to prevent falls. This information is not intended to replace advice given to you by your health care provider. Make sure you discuss any questions you have with your health care provider. Document Revised: 04/30/2018 Document Reviewed: 11/20/2016 Elsevier Patient Education  2020 [employer].

## 2019-08-25 NOTE — Assessment & Plan Note (Signed)
Chronic, stable. Continue current regimen. 

## 2019-09-05 ENCOUNTER — Other Ambulatory Visit: Payer: Self-pay | Admitting: Family Medicine

## 2019-09-05 MED ORDER — VITAMIN B-12 500 MCG PO TABS: 500.0000 ug | ORAL_TABLET | ORAL | Status: DC

## 2019-09-30 ENCOUNTER — Other Ambulatory Visit: Payer: Self-pay

## 2019-09-30 MED ORDER — LOSARTAN POTASSIUM 25 MG PO TABS: 25.0000 mg | ORAL_TABLET | Freq: Every day | ORAL | 3 refills | Status: DC

## 2019-09-30 NOTE — Telephone Encounter (Signed)
Received faxed refill request from [facility].  E-scribed refill.  

## 2019-10-28 DIAGNOSIS — H52203 Unspecified astigmatism, bilateral: Secondary | ICD-10-CM | POA: Diagnosis not present

## 2019-10-28 DIAGNOSIS — H26491 Other secondary cataract, right eye: Secondary | ICD-10-CM | POA: Diagnosis not present

## 2019-10-28 DIAGNOSIS — Z961 Presence of intraocular lens: Secondary | ICD-10-CM | POA: Diagnosis not present

## 2019-11-08 ENCOUNTER — Other Ambulatory Visit (INDEPENDENT_AMBULATORY_CARE_PROVIDER_SITE_OTHER): Payer: Self-pay | Admitting: Nurse Practitioner

## 2019-11-08 DIAGNOSIS — I82402 Acute embolism and thrombosis of unspecified deep veins of left lower extremity: Secondary | ICD-10-CM

## 2019-11-09 ENCOUNTER — Other Ambulatory Visit: Payer: Self-pay

## 2019-11-09 ENCOUNTER — Encounter (INDEPENDENT_AMBULATORY_CARE_PROVIDER_SITE_OTHER): Payer: Self-pay | Admitting: Vascular Surgery

## 2019-11-09 ENCOUNTER — Ambulatory Visit (INDEPENDENT_AMBULATORY_CARE_PROVIDER_SITE_OTHER): Payer: Medicare Other | Admitting: Vascular Surgery

## 2019-11-09 ENCOUNTER — Ambulatory Visit (INDEPENDENT_AMBULATORY_CARE_PROVIDER_SITE_OTHER): Payer: Medicare Other

## 2019-11-09 VITALS — BP 152/65 | HR 61 | Resp 16 | Wt 193.8 lb

## 2019-11-09 DIAGNOSIS — I82402 Acute embolism and thrombosis of unspecified deep veins of left lower extremity: Secondary | ICD-10-CM

## 2019-11-09 DIAGNOSIS — E785 Hyperlipidemia, unspecified: Secondary | ICD-10-CM

## 2019-11-09 DIAGNOSIS — I10 Essential (primary) hypertension: Secondary | ICD-10-CM | POA: Diagnosis not present

## 2019-11-09 NOTE — Progress Notes (Signed)
MRN : [id number]  Shawn Andrews is a 86 y.o. (30-Dec-1928) male who presents with chief complaint of  Chief Complaint  Patient presents with  . Follow-up    ultrasound follow up  .  History of Present Illness: Patient returns today in follow up of his DVT.  He had a provoked DVT about 3 months ago after a long trip with immobility.  This was a popliteal and distal femoral DVT.  He was treated appropriately with Eliquis which she has completed a 16-month course.  He has no leg pain or swelling currently.  He wears his compression stockings daily.  DVT study today shows resolution of his lower extremity DVT with no evidence of DVT in either lower extremity.  Current Outpatient Medications  Medication Sig Dispense Refill  . acetaminophen (TYLENOL 8 HOUR) 650 MG CR tablet Take 1 tablet (650 mg total) by mouth 2 (two) times a day.    Marland Kitchen amLODipine (NORVASC) 10 MG tablet TAKE 1 TABLET BY MOUTH  DAILY (Patient taking differently: Take 10 mg by mouth daily. ) 90 tablet 3  . apixaban (ELIQUIS) 5 MG TABS tablet Take 1 tablet (5 mg total) by mouth 2 (two) times daily. 60 tablet 3  . Cholecalciferol (VITAMIN D3) 25 MCG (1000 UT) CAPS Take 1 capsule (1,000 Units total) by mouth daily. 30 capsule   . clindamycin (CLEOCIN) 150 MG capsule Take 4 capsules by mouth as directed.    . doxazosin (CARDURA) 4 MG tablet TAKE 1 TABLET BY MOUTH  DAILY (Patient taking differently: Take 4 mg by mouth daily. Take 1 tablet by mouth  daily) 90 tablet 3  . finasteride (PROSCAR) 5 MG tablet TAKE 1 TABLET BY MOUTH  DAILY (Patient taking differently: Take 5 mg by mouth daily. ) 90 tablet 3  . FLAXSEED, LINSEED, PO Take 1 tablet by mouth 2 (two) times daily.     Marland Kitchen loratadine (CLARITIN) 10 MG tablet Take 1 tablet (10 mg total) by mouth daily as needed for allergies. 30 tablet   . losartan (COZAAR) 25 MG tablet Take 1 tablet (25 mg total) by mouth daily. 90 tablet 3  . metoprolol tartrate (LOPRESSOR) 50 MG tablet TAKE 1 TABLET BY  MOUTH  TWICE DAILY (Patient taking differently: Take 50 mg by mouth 2 (two) times daily. ) 180 tablet 3  . rOPINIRole (REQUIP) 0.5 MG tablet Take 1 tablet (0.5 mg total) by mouth in the morning and at bedtime.    . vitamin B-12 (CYANOCOBALAMIN) 500 MCG tablet Take 1 tablet (500 mcg total) by mouth every Monday, Wednesday, and Friday.     No current facility-administered medications for this visit.    Past Medical History:  Diagnosis Date  . Abnormal drug screen 06/2014   inapprop neg xanax rpt 3 mo (06/2014)  . BPH (benign prostatic hypertrophy) 01/21/98   has had 3 biopsies in past ([facility]) decided to stop PSA/DRE  . Hyperlipidemia 01/21/02  . Hypertension 05/22/03  . Left lumbar radiculopathy   . Osteoarthritis 01/22/88   knees, lumbar spondylosis and listhesis    Past Surgical History:  Procedure Laterality Date  . CATARACT EXTRACTION  2013   bilateral  . ESI Left 08/2014, 11/2014, 08/2015   L S1, L L5/S1 transforaminal ESI; L4/5 L5/S1 zygapophysial injections, L S1 transforaminal ([facility])  . FINGER SURGERY     right middle, partial traumatic amputation  . KNEE ARTHROSCOPY  1990   right  . KNEE SURGERY  04/2006   right partial  knee replacement in [place] - rec ppx abx for any invasive procedure     Social History   Tobacco Use  . Smoking status: Former Smoker    Packs/day: 0.00    Years: 0.00    Pack years: 0.00    Types: Pipe    Quit date: 12/26/2000    Years since quitting: 18.8  . Smokeless tobacco: Current User    Types: Chew  . Tobacco comment: quit over 30 years ago  Vaping Use  . Vaping Use: Never used  Substance Use Topics  . Alcohol use: Yes    Alcohol/week: 0.0 standard drinks    Comment: Occasional beer  . Drug use: No      Family History  Problem Relation Age of Onset  . Hypertension Mother   . Heart disease Brother 73       MI  . Diabetes Brother   . Cancer Daughter        melanoma  . Stroke Neg Hx      Allergies  Allergen Reactions  .  Penicillins Swelling    Of tongue Has patient had a PCN reaction causing immediate rash, facial/tongue/throat swelling, SOB or lightheadedness with hypotension: Yes Has patient had a PCN reaction causing severe rash involving mucus membranes or skin necrosis: No Has patient had a PCN reaction that required hospitalization: No Has patient had a PCN reaction occurring within the last 10 years: No If all of the above answers are "NO", then may proceed with Cephalosporin use.  . Cialis [Tadalafil] Other (See Comments)    Indigestion and "felt like crap"  . Lipitor [Atorvastatin] Other (See Comments)    Severe myalgias  . Lisinopril Cough    Dry nagging cough     REVIEW OF SYSTEMS (Negative unless checked)  Constitutional: [] Weight loss  [] Fever  [] Chills Cardiac: [] Chest pain   [] Chest pressure   [] Palpitations   [] Shortness of breath when laying flat   [] Shortness of breath at rest   [] Shortness of breath with exertion. Vascular:  [x] Pain in legs with walking   [] Pain in legs at rest   [] Pain in legs when laying flat   [] Claudication   [] Pain in feet when walking  [] Pain in feet at rest  [] Pain in feet when laying flat   [x] History of DVT   [x] Phlebitis   [x] Swelling in legs   [] Varicose veins   [] Non-healing ulcers Pulmonary:   [] Uses home oxygen   [] Productive cough   [] Hemoptysis   [] Wheeze  [] COPD   [] Asthma Neurologic:  [] Dizziness  [] Blackouts   [] Seizures   [] History of stroke   [] History of TIA  [] Aphasia   [] Temporary blindness   [] Dysphagia   [] Weakness or numbness in arms   [] Weakness or numbness in legs Musculoskeletal:  [] Arthritis   [] Joint swelling   [] Joint pain   [x] Low back pain Hematologic:  [] Easy bruising  [] Easy bleeding   [] Hypercoagulable state   [] Anemic   Gastrointestinal:  [] Blood in stool   [] Vomiting blood  [] Gastroesophageal reflux/heartburn   [] Abdominal pain Genitourinary:  [] Chronic kidney disease   [] Difficult urination  [x] Frequent urination  [] Burning with  urination   [] Hematuria Skin:  [] Rashes   [] Ulcers   [] Wounds Psychological:  [] History of anxiety   []  History of major depression.  Physical Examination  BP (!) 152/65 (BP Location: Right Arm)   Pulse 61   Resp 16   Wt 193 lb 12.8 oz (87.9 kg)   BMI 28.41 kg/m  Gen:  WD/WN, NAD.  Appears much younger than stated age Head: [place]/AT, No temporalis wasting. Ear/Nose/Throat: Hearing grossly intact, nares w/o erythema or drainage Eyes: Conjunctiva clear. Sclera non-icteric Neck: Supple.  Trachea midline Pulmonary:  Good air movement, no use of accessory muscles.  Cardiac: RRR, no JVD Vascular:  Vessel Right Left  Radial Palpable Palpable                          PT Palpable Palpable  DP Palpable Palpable    Musculoskeletal: M/S 5/5 throughout.  No deformity or atrophy.  No appreciable edema. Neurologic: Sensation grossly intact in extremities.  Symmetrical.  Speech is fluent.  Psychiatric: Judgment intact, Mood & affect appropriate for pt's clinical situation. Dermatologic: No rashes or ulcers noted.  No cellulitis or open wounds.       Labs Recent Results (from the past 2160 hour(s))  Hepatic function panel     Status: None   Collection Time: 08/25/19  9:57 AM  Result Value Ref Range   Total Bilirubin 0.7 0.2 - 1.2 mg/dL   Bilirubin, Direct 0.1 0.0 - 0.3 mg/dL   Alkaline Phosphatase 62 39 - 117 U/L   AST 12 0 - 37 U/L   ALT 9 0 - 53 U/L   Total Protein 6.4 6.0 - 8.3 g/dL   Albumin 4.4 3.5 - 5.2 g/dL  Lipid panel     Status: Abnormal   Collection Time: 08/25/19  9:57 AM  Result Value Ref Range   Cholesterol 202 (H) 0 - 200 mg/dL    Comment: ATP III Classification       Desirable:  < 200 mg/dL               Borderline High:  200 - 239 mg/dL          High:  > = 240 mg/dL   Triglycerides 110.0 0 - 149 mg/dL    Comment: Normal:  <150 mg/dLBorderline High:  150 - 199 mg/dL   HDL 45.80 >39.00 mg/dL   VLDL 22.0 0.0 - 40.0 mg/dL   LDL Cholesterol 134 (H) 0 - 99 mg/dL     Total CHOL/HDL Ratio 4     Comment:                Men          Women1/2 Average Risk     3.4          3.3Average Risk          5.0          4.42X Average Risk          9.6          7.13X Average Risk          15.0          11.0                       NonHDL 155.82     Comment: NOTE:  Non-HDL goal should be 30 mg/dL higher than patient's LDL goal (i.e. LDL goal of < 70 mg/dL, would have non-HDL goal of < 100 mg/dL)  TSH     Status: None   Collection Time: 08/25/19  9:57 AM  Result Value Ref Range   TSH 2.28 0.35 - 4.50 uIU/mL  CBC with Differential/Platelet     Status: Abnormal   Collection Time: 08/25/19  9:57 AM  Result Value Ref Range  WBC 10.9 (H) 4.0 - 10.5 K/uL   RBC 4.32 4.22 - 5.81 Mil/uL   Hemoglobin 13.0 13.0 - 17.0 g/dL   HCT 37.6 (L) 39 - 52 %   MCV 86.9 78.0 - 100.0 fl   MCHC 34.7 30.0 - 36.0 g/dL   RDW 13.6 11.5 - 15.5 %   Platelets 226.0 150 - 400 K/uL   Neutrophils Relative % 73.7 43 - 77 %   Lymphocytes Relative 18.0 12 - 46 %   Monocytes Relative 5.5 3 - 12 %   Eosinophils Relative 2.5 0 - 5 %   Basophils Relative 0.3 0 - 3 %   Neutro Abs 8.0 (H) 1.4 - 7.7 K/uL   Lymphs Abs 2.0 0.7 - 4.0 K/uL   Monocytes Absolute 0.6 0.1 - 1.0 K/uL   Eosinophils Absolute 0.3 0.0 - 0.7 K/uL   Basophils Absolute 0.0 0.0 - 0.1 K/uL  Vitamin B12     Status: Abnormal   Collection Time: 08/25/19  9:57 AM  Result Value Ref Range   Vitamin B-12 1,377 (H) 211 - 911 pg/mL  Ferritin     Status: None   Collection Time: 08/25/19  9:57 AM  Result Value Ref Range   Ferritin 149.2 22.0 - 322.0 ng/mL  IBC panel     Status: Abnormal   Collection Time: 08/25/19  9:57 AM  Result Value Ref Range   Iron 64 42 - 165 ug/dL   Transferrin 234.0 212.0 - 360.0 mg/dL   Saturation Ratios 19.5 (L) 20.0 - 50.0 %  Folate     Status: None   Collection Time: 08/25/19  9:57 AM  Result Value Ref Range   Folate 12.7 >5.9 ng/mL  VITAMIN D 25 Hydroxy (Vit-D Deficiency, Fractures)     Status: None    Collection Time: 08/25/19  9:57 AM  Result Value Ref Range   VITD 41.95 30.00 - 100.00 ng/mL  Basic metabolic panel     Status: Abnormal   Collection Time: 08/25/19  9:57 AM  Result Value Ref Range   Sodium 138 135 - 145 mEq/L   Potassium 4.7 3.5 - 5.1 mEq/L   Chloride 103 96 - 112 mEq/L   CO2 29 19 - 32 mEq/L   Glucose, Bld 126 (H) 70 - 99 mg/dL   BUN 26 (H) 6 - 23 mg/dL   Creatinine, Ser 1.28 0.40 - 1.50 mg/dL   GFR 52.88 (L) >60.00 mL/min   Calcium 9.6 8.4 - 10.5 mg/dL    Radiology No results found.  Assessment/Plan  Leg DVT (deep venous thromboembolism), acute, left (HCC) DVT study today shows resolution of his lower extremity DVT with no evidence of DVT in either lower extremity.  The patient is had a great response to conservative therapy with anticoagulation, compression stockings and elevation.  No intervention was performed.  His symptoms are currently minimal.  I am okay particular with his age stopping anticoagulation at this point and going to just an 81 mg aspirin daily.  He will continue to wear his compression stockings.  I will see him back as needed.  Essential hypertension blood pressure control important in reducing the progression of atherosclerotic disease. On appropriate oral medications.   HLD (hyperlipidemia) lipid control important in reducing the progression of atherosclerotic disease. Continue statin therapy     Leotis Pain, MD  11/09/2019 11:02 AM    This note was created with Dragon medical transcription system.  Any errors from dictation are purely unintentional

## 2019-11-09 NOTE — Assessment & Plan Note (Signed)
lipid control important in reducing the progression of atherosclerotic disease. Continue statin therapy  

## 2019-11-09 NOTE — Assessment & Plan Note (Signed)
blood pressure control important in reducing the progression of atherosclerotic disease. On appropriate oral medications.  

## 2019-11-09 NOTE — Assessment & Plan Note (Signed)
DVT study today shows resolution of his lower extremity DVT with no evidence of DVT in either lower extremity.  The patient is had a great response to conservative therapy with anticoagulation, compression stockings and elevation.  No intervention was performed.  His symptoms are currently minimal.  I am okay particular with his age stopping anticoagulation at this point and going to just an 81 mg aspirin daily.  He will continue to wear his compression stockings.  I will see him back as needed.

## 2019-11-17 ENCOUNTER — Other Ambulatory Visit: Payer: Self-pay | Admitting: Family Medicine

## 2019-12-06 DIAGNOSIS — R251 Tremor, unspecified: Secondary | ICD-10-CM | POA: Diagnosis not present

## 2020-01-04 DIAGNOSIS — L819 Disorder of pigmentation, unspecified: Secondary | ICD-10-CM | POA: Diagnosis not present

## 2020-01-04 DIAGNOSIS — D1801 Hemangioma of skin and subcutaneous tissue: Secondary | ICD-10-CM | POA: Diagnosis not present

## 2020-01-04 DIAGNOSIS — L570 Actinic keratosis: Secondary | ICD-10-CM | POA: Diagnosis not present

## 2020-01-04 DIAGNOSIS — L905 Scar conditions and fibrosis of skin: Secondary | ICD-10-CM | POA: Diagnosis not present

## 2020-01-04 DIAGNOSIS — Z85828 Personal history of other malignant neoplasm of skin: Secondary | ICD-10-CM | POA: Diagnosis not present

## 2020-05-23 DIAGNOSIS — J209 Acute bronchitis, unspecified: Secondary | ICD-10-CM | POA: Diagnosis not present

## 2020-05-23 DIAGNOSIS — W57XXXA Bitten or stung by nonvenomous insect and other nonvenomous arthropods, initial encounter: Secondary | ICD-10-CM | POA: Diagnosis not present

## 2020-06-15 DIAGNOSIS — C44629 Squamous cell carcinoma of skin of left upper limb, including shoulder: Secondary | ICD-10-CM | POA: Diagnosis not present

## 2020-06-15 DIAGNOSIS — L570 Actinic keratosis: Secondary | ICD-10-CM | POA: Diagnosis not present

## 2020-06-15 DIAGNOSIS — D485 Neoplasm of uncertain behavior of skin: Secondary | ICD-10-CM | POA: Diagnosis not present

## 2020-08-09 ENCOUNTER — Other Ambulatory Visit: Payer: Self-pay | Admitting: Family Medicine

## 2020-08-24 ENCOUNTER — Other Ambulatory Visit: Payer: Self-pay | Admitting: Family Medicine

## 2020-08-24 DIAGNOSIS — R413 Other amnesia: Secondary | ICD-10-CM

## 2020-08-24 DIAGNOSIS — N1831 Chronic kidney disease, stage 3a: Secondary | ICD-10-CM

## 2020-08-24 DIAGNOSIS — E559 Vitamin D deficiency, unspecified: Secondary | ICD-10-CM

## 2020-08-24 DIAGNOSIS — E785 Hyperlipidemia, unspecified: Secondary | ICD-10-CM

## 2020-08-25 ENCOUNTER — Other Ambulatory Visit (INDEPENDENT_AMBULATORY_CARE_PROVIDER_SITE_OTHER): Payer: Medicare Other

## 2020-08-25 ENCOUNTER — Ambulatory Visit: Payer: Medicare Other

## 2020-08-25 ENCOUNTER — Other Ambulatory Visit: Payer: Self-pay

## 2020-08-25 DIAGNOSIS — E785 Hyperlipidemia, unspecified: Secondary | ICD-10-CM | POA: Diagnosis not present

## 2020-08-25 DIAGNOSIS — N1831 Chronic kidney disease, stage 3a: Secondary | ICD-10-CM

## 2020-08-25 DIAGNOSIS — E559 Vitamin D deficiency, unspecified: Secondary | ICD-10-CM

## 2020-08-25 DIAGNOSIS — R413 Other amnesia: Secondary | ICD-10-CM

## 2020-08-25 LAB — COMPREHENSIVE METABOLIC PANEL
ALT: 11 U/L (ref 0–53)
AST: 15 U/L (ref 0–37)
Albumin: 4.2 g/dL (ref 3.5–5.2)
Alkaline Phosphatase: 60 U/L (ref 39–117)
BUN: 21 mg/dL (ref 6–23)
CO2: 29 mEq/L (ref 19–32)
Calcium: 9 mg/dL (ref 8.4–10.5)
Chloride: 103 mEq/L (ref 96–112)
Creatinine, Ser: 1.21 mg/dL (ref 0.40–1.50)
GFR: 52.79 mL/min — ABNORMAL LOW (ref >60.00)
Glucose, Bld: 111 mg/dL — ABNORMAL HIGH (ref 70–99)
Potassium: 4.6 mEq/L (ref 3.5–5.1)
Sodium: 138 mEq/L (ref 135–145)
Total Bilirubin: 0.5 mg/dL (ref 0.2–1.2)
Total Protein: 6.3 g/dL (ref 6.0–8.3)

## 2020-08-25 LAB — LIPID PANEL
Cholesterol: 212 mg/dL — ABNORMAL HIGH (ref 0–200)
HDL: 49.8 mg/dL (ref >39.00)
LDL Cholesterol: 138 mg/dL — ABNORMAL HIGH (ref 0–99)
NonHDL: 162.15
Total CHOL/HDL Ratio: 4
Triglycerides: 119 mg/dL (ref 0.0–149.0)
VLDL: 23.8 mg/dL (ref 0.0–40.0)

## 2020-08-25 LAB — CBC WITH DIFFERENTIAL/PLATELET
Basophils Absolute: 0 10*3/uL (ref 0.0–0.1)
Basophils Relative: 0.5 % (ref 0.0–3.0)
Eosinophils Absolute: 0.1 10*3/uL (ref 0.0–0.7)
Eosinophils Relative: 2.3 % (ref 0.0–5.0)
HCT: 38.9 % — ABNORMAL LOW (ref 39.0–52.0)
Hemoglobin: 13.4 g/dL (ref 13.0–17.0)
Lymphocytes Relative: 31 % (ref 12.0–46.0)
Lymphs Abs: 1.9 10*3/uL (ref 0.7–4.0)
MCHC: 34.6 g/dL (ref 30.0–36.0)
MCV: 86.7 fl (ref 78.0–100.0)
Monocytes Absolute: 0.4 10*3/uL (ref 0.1–1.0)
Monocytes Relative: 7.1 % (ref 3.0–12.0)
Neutro Abs: 3.7 10*3/uL (ref 1.4–7.7)
Neutrophils Relative %: 59.1 % (ref 43.0–77.0)
Platelets: 191 10*3/uL (ref 150.0–400.0)
RBC: 4.48 Mil/uL (ref 4.22–5.81)
RDW: 13.8 % (ref 11.5–15.5)
WBC: 6.2 10*3/uL (ref 4.0–10.5)

## 2020-08-25 LAB — VITAMIN B12: Vitamin B-12: >1550 pg/mL — ABNORMAL HIGH (ref 211–911)

## 2020-08-25 LAB — VITAMIN D 25 HYDROXY (VIT D DEFICIENCY, FRACTURES): VITD: 41.59 ng/mL (ref 30.00–100.00)

## 2020-08-29 DIAGNOSIS — L821 Other seborrheic keratosis: Secondary | ICD-10-CM | POA: Diagnosis not present

## 2020-08-29 DIAGNOSIS — D229 Melanocytic nevi, unspecified: Secondary | ICD-10-CM | POA: Diagnosis not present

## 2020-08-29 DIAGNOSIS — L819 Disorder of pigmentation, unspecified: Secondary | ICD-10-CM | POA: Diagnosis not present

## 2020-08-29 DIAGNOSIS — L905 Scar conditions and fibrosis of skin: Secondary | ICD-10-CM | POA: Diagnosis not present

## 2020-08-29 DIAGNOSIS — L814 Other melanin hyperpigmentation: Secondary | ICD-10-CM | POA: Diagnosis not present

## 2020-08-29 DIAGNOSIS — Z85828 Personal history of other malignant neoplasm of skin: Secondary | ICD-10-CM | POA: Diagnosis not present

## 2020-08-29 DIAGNOSIS — L812 Freckles: Secondary | ICD-10-CM | POA: Diagnosis not present

## 2020-08-29 DIAGNOSIS — L570 Actinic keratosis: Secondary | ICD-10-CM | POA: Diagnosis not present

## 2020-08-30 ENCOUNTER — Other Ambulatory Visit: Payer: Self-pay

## 2020-08-30 ENCOUNTER — Ambulatory Visit (INDEPENDENT_AMBULATORY_CARE_PROVIDER_SITE_OTHER): Payer: Medicare Other | Admitting: Family Medicine

## 2020-08-30 ENCOUNTER — Encounter: Payer: Self-pay | Admitting: Family Medicine

## 2020-08-30 VITALS — BP 132/80 | HR 61 | Temp 97.8°F | Ht 69.5 in | Wt 196.0 lb

## 2020-08-30 DIAGNOSIS — E7800 Pure hypercholesterolemia, unspecified: Secondary | ICD-10-CM

## 2020-08-30 DIAGNOSIS — N400 Enlarged prostate without lower urinary tract symptoms: Secondary | ICD-10-CM

## 2020-08-30 DIAGNOSIS — Z7189 Other specified counseling: Secondary | ICD-10-CM

## 2020-08-30 DIAGNOSIS — I82402 Acute embolism and thrombosis of unspecified deep veins of left lower extremity: Secondary | ICD-10-CM

## 2020-08-30 DIAGNOSIS — E559 Vitamin D deficiency, unspecified: Secondary | ICD-10-CM

## 2020-08-30 DIAGNOSIS — H9193 Unspecified hearing loss, bilateral: Secondary | ICD-10-CM

## 2020-08-30 DIAGNOSIS — R748 Abnormal levels of other serum enzymes: Secondary | ICD-10-CM

## 2020-08-30 DIAGNOSIS — Z0000 Encounter for general adult medical examination without abnormal findings: Secondary | ICD-10-CM | POA: Diagnosis not present

## 2020-08-30 DIAGNOSIS — G250 Essential tremor: Secondary | ICD-10-CM

## 2020-08-30 DIAGNOSIS — I10 Essential (primary) hypertension: Secondary | ICD-10-CM

## 2020-08-30 DIAGNOSIS — N1831 Chronic kidney disease, stage 3a: Secondary | ICD-10-CM

## 2020-08-30 MED ORDER — ASPIRIN 81 MG PO TBEC
81.0000 mg | DELAYED_RELEASE_TABLET | ORAL | Status: DC
Start: 2020-08-30 — End: 2021-03-04

## 2020-08-30 MED ORDER — VITAMIN B-12 500 MCG PO TABS: 500.0000 ug | ORAL_TABLET | ORAL | Status: DC

## 2020-08-30 MED ORDER — FAMOTIDINE 20 MG PO TABS: 20.0000 mg | ORAL_TABLET | Freq: Every day | ORAL | 1 refills | Status: DC

## 2020-08-30 NOTE — Patient Instructions (Addendum)
If interested, check with pharmacy about new 2 shot shingles series (shingrix).  Drop vitamin b12 dose to once weekly.  Good to see you today - you are doing well today.  Return as needed or in 1 year for next physical.   Health Maintenance After Age 85 After age 23, you are at a higher risk for certain long-term diseases and infections as well as injuries from falls. Falls are a major cause of broken bones and head injuries in people who are older than age 73. Getting regular preventive care can help to keep you healthy and well. Preventive care includes getting regular testing and making lifestyle changes as recommended by your health care provider. Talk with your health care provider about: Which screenings and tests you should have. A screening is a test that checks for a disease when you have no symptoms. A diet and exercise plan that is right for you. What should I know about screenings and tests to prevent falls? Screening and testing are the best ways to find a health problem early. Early diagnosis and treatment give you the best chance of managing medical conditions that are common after age 70. Certain conditions and lifestyle choices may make you more likely to have a fall. Your health care provider may recommend: Regular vision checks. Poor vision and conditions such as cataracts can make you more likely to have a fall. If you wear glasses, make sure to get your prescription updated if your vision changes. Medicine review. Work with your health care provider to regularly review all of the medicines you are taking, including over-the-counter medicines. Ask your health care provider about any side effects that may make you more likely to have a fall. Tell your health care provider if any medicines that you take make you feel dizzy or sleepy. Osteoporosis screening. Osteoporosis is a condition that causes the bones to get weaker. This can make the bones weak and cause them to break more  easily. Blood pressure screening. Blood pressure changes and medicines to control blood pressure can make you feel dizzy. Strength and balance checks. Your health care provider may recommend certain tests to check your strength and balance while standing, walking, or changing positions. Foot health exam. Foot pain and numbness, as well as not wearing proper footwear, can make you more likely to have a fall. Depression screening. You may be more likely to have a fall if you have a fear of falling, feel emotionally low, or feel unable to do activities that you used to do. Alcohol use screening. Using too much alcohol can affect your balance and may make you more likely to have a fall. What actions can I take to lower my risk of falls? General instructions Talk with your health care provider about your risks for falling. Tell your health care provider if: You fall. Be sure to tell your health care provider about all falls, even ones that seem minor. You feel dizzy, sleepy, or off-balance. Take over-the-counter and prescription medicines only as told by your health care provider. These include any supplements. Eat a healthy diet and maintain a healthy weight. A healthy diet includes low-fat dairy products, low-fat (lean) meats, and fiber from whole grains, beans, and lots of fruits and vegetables. Home safety Remove any tripping hazards, such as rugs, cords, and clutter. Install safety equipment such as grab bars in bathrooms and safety rails on stairs. Keep rooms and walkways well-lit. Activity  Follow a regular exercise program to stay fit. This  will help you maintain your balance. Ask your health care provider what types of exercise are appropriate for you. If you need a cane or walker, use it as recommended by your health care provider. Wear supportive shoes that have nonskid soles.  Lifestyle Do not drink alcohol if your health care provider tells you not to drink. If you drink alcohol,  limit how much you have: 0-1 drink a day for women. 0-2 drinks a day for men. Be aware of how much alcohol is in your drink. In the [place] one drink equals one typical bottle of beer (12 oz), one-half glass of wine (5 oz), or one shot of hard liquor (1 oz). Do not use any products that contain nicotine or tobacco, such as cigarettes and e-cigarettes. If you need help quitting, ask your health care provider. Summary Having a healthy lifestyle and getting preventive care can help to protect your health and wellness after age 16. Screening and testing are the best way to find a health problem early and help you avoid having a fall. Early diagnosis and treatment give you the best chance for managing medical conditions that are more common for people who are older than age 39. Falls are a major cause of broken bones and head injuries in people who are older than age 8. Take precautions to prevent a fall at home. Work with your health care provider to learn what changes you can make to improve your health and wellness and to prevent falls. This information is not intended to replace advice given to you by your health care provider. Make sure you discuss any questions you have with your healthcare provider. Document Revised: 12/24/2019 Document Reviewed: 12/24/2019 Elsevier Patient Education  2022 [employer].

## 2020-08-30 NOTE — Progress Notes (Signed)
Patient ID: Shawn Andrews, male    DOB: 1928/08/28, 87 y.o.   MRN: [id number]  This visit was conducted in person.  BP 132/80   Pulse 61   Temp 97.8 F (36.6 C) (Temporal)   Ht 5' 9.5" (1.765 m)   Wt 196 lb (88.9 kg)   SpO2 97%   BMI 28.53 kg/m    CC: AMW Subjective:   HPI: Shawn Andrews is a 87 y.o. male presenting on 08/30/2020 for Medicare Wellness (Wants to discuss aspirin and request rx for omeprazole 20 mg.)   Did not see health advisor.   Hearing Screening - Comments:: Wears bilateral hearing aids.  Wearing at today's OV.  Vision Screening - Comments:: Last eye exam, 10/2019.  [place] Visit from 08/30/2020 in [facility] at [facility]  PHQ-2 Total Score 0       Fall Risk  08/30/2020 08/25/2019 08/25/2018 07/29/2017 07/19/2016  Falls in the past year? 0 0 0 No No    Dx extensive LLE DVT last year placed on eliquis for 3 months, rpt US showed resolution - eliquis changed to aspirin '81mg'$  daily but he notes easy bruising with this. He does wear compression stockings. Saw [facility].   Preventative: Colon cancer screening - aged out Prostate screening - BPH - has had elevated PSA in past but nomal biopsies. On cardura and finasteride. Denies voiding difficulty. We decided to stop PSA/DRE checks 2015. Lung cancer screening - not eligible  Flu shot yearly Td 2003 Pneumovax-23 2012. [place] 04/2019, 05/2019 Shingrix - discussed, declines. H/o shingles x2 in past.  Advanced directives: scanned 07/2013 and in chart. Son is HCPOA Therapist, occupational [place]). Doesn't want prolonged life support. Seat belt use discussed.  Sunscreen use discussed. No changing moles on skin. Sees derm regularly. Fmhx melanoma.  Ex smoker - quit 2002. Some chewing tobacco Alcohol - seldom  Dentist - Q62moEye exam - sees yearly  Bowel - no constipation Bladder - no incontinence  Widowed, remarried [phone] Daughter deceased from metastatic melanoma   [facility] walks 1.5 miles 3d/wk, [facility] 3[facility] cardio and weight training Diet: good water, fruits/vegetables daily      Relevant past medical, surgical, family and social history reviewed and updated as indicated. Interim medical history since our last visit reviewed. Allergies and medications reviewed and updated. Outpatient Medications Prior to Visit  Medication Sig Dispense Refill   acetaminophen (TYLENOL 8 HOUR) 650 MG CR tablet Take 1 tablet (650 mg total) by mouth 2 (two) times a day.     amLODipine (NORVASC) 10 MG tablet TAKE 1 TABLET BY MOUTH  DAILY 90 tablet 3   Cholecalciferol (VITAMIN D3) 25 MCG (1000 UT) CAPS Take 1 capsule (1,000 Units total) by mouth daily. 30 capsule    clindamycin (CLEOCIN) 150 MG capsule Take 4 capsules by mouth as directed.     doxazosin (CARDURA) 4 MG tablet TAKE 1 TABLET BY MOUTH  DAILY 90 tablet 3   finasteride (PROSCAR) 5 MG tablet TAKE 1 TABLET BY MOUTH  DAILY 90 tablet 3   FLAXSEED, LINSEED, PO Take 1 tablet by mouth 2 (two) times daily.      loratadine (CLARITIN) 10 MG tablet Take 1 tablet (10 mg total) by mouth daily as needed for allergies. 30 tablet    losartan (COZAAR) 25 MG tablet TAKE 1 TABLET BY MOUTH  DAILY 90 tablet 0   metoprolol tartrate (LOPRESSOR)  50 MG tablet TAKE 1 TABLET BY MOUTH  TWICE DAILY 180 tablet 3   rOPINIRole (REQUIP) 0.5 MG tablet Take 1 tablet (0.5 mg total) by mouth in the morning and at bedtime.     ASPIRIN 81 PO Take by mouth daily.     vitamin B-12 (CYANOCOBALAMIN) 500 MCG tablet Take 1 tablet (500 mcg total) by mouth every Monday, Wednesday, and Friday.     apixaban (ELIQUIS) 5 MG TABS tablet Take 1 tablet (5 mg total) by mouth 2 (two) times daily. 60 tablet 3   No facility-administered medications prior to visit.     Per HPI unless specifically indicated in ROS section below Review of Systems  Constitutional:  Negative for activity change, appetite change, chills, fatigue, fever  and unexpected weight change.  HENT:  Negative for hearing loss.   Eyes:  Negative for visual disturbance.  Respiratory:  Positive for chest tightness. Negative for cough, shortness of breath and wheezing.   Cardiovascular:  Negative for chest pain, palpitations and leg swelling.  Gastrointestinal:  Negative for abdominal distention, abdominal pain, blood in stool, constipation, diarrhea, nausea and vomiting.  Genitourinary:  Negative for difficulty urinating and hematuria.  Musculoskeletal:  Positive for back pain. Negative for arthralgias, myalgias and neck pain.  Skin:  Negative for rash.  Neurological:  Negative for dizziness, seizures, syncope and headaches.  Hematological:  Negative for adenopathy. Bruises/bleeds easily.  Psychiatric/Behavioral:  Negative for dysphoric mood. The patient is not nervous/anxious.    Objective:  BP 132/80   Pulse 61   Temp 97.8 F (36.6 C) (Temporal)   Ht 5' 9.5" (1.765 m)   Wt 196 lb (88.9 kg)   SpO2 97%   BMI 28.53 kg/m   Wt Readings from Last 3 Encounters:  08/30/20 196 lb (88.9 kg)  11/09/19 193 lb 12.8 oz (87.9 kg)  08/25/19 189 lb (85.7 kg)      Physical Exam Vitals and nursing note reviewed.  Constitutional:      General: He is not in acute distress.    Appearance: Normal appearance. He is well-developed. He is not ill-appearing.  HENT:     Head: Normocephalic and atraumatic.     Right Ear: Hearing, tympanic membrane, ear canal and external ear normal.     Left Ear: Hearing, tympanic membrane, ear canal and external ear normal.  Eyes:     General: No scleral icterus.    Extraocular Movements: Extraocular movements intact.     Conjunctiva/sclera: Conjunctivae normal.     Pupils: Pupils are equal, round, and reactive to light.  Neck:     Thyroid: No thyroid mass or thyromegaly.     Vascular: No carotid bruit.  Cardiovascular:     Rate and Rhythm: Normal rate and regular rhythm.     Pulses: Normal pulses.          Radial pulses  are 2+ on the right side and 2+ on the left side.     Heart sounds: Normal heart sounds. No murmur heard. Pulmonary:     Effort: Pulmonary effort is normal. No respiratory distress.     Breath sounds: Normal breath sounds. No wheezing, rhonchi or rales.  Abdominal:     General: Bowel sounds are normal. There is no distension.     Palpations: Abdomen is soft. There is no mass.     Tenderness: There is no abdominal tenderness. There is no guarding or rebound.     Hernia: No hernia is present.  Musculoskeletal:  General: Normal range of motion.     Cervical back: Normal range of motion and neck supple.     Right lower leg: No edema.     Left lower leg: No edema.  Lymphadenopathy:     Cervical: No cervical adenopathy.  Skin:    General: Skin is warm and dry.     Findings: No rash.  Neurological:     General: No focal deficit present.     Mental Status: He is alert and oriented to person, place, and time.     Comments:  Recall 2/3, 3/3 with cue Calculation 3/5 DLORW  Psychiatric:        Mood and Affect: Mood normal.        Behavior: Behavior normal.        Thought Content: Thought content normal.        Judgment: Judgment normal.      Results for orders placed or performed in visit on 08/25/20  Vitamin B12  Result Value Ref Range   Vitamin B-12 >1550 (H) 211 - 911 pg/mL  CBC with Differential/Platelet  Result Value Ref Range   WBC 6.2 4.0 - 10.5 K/uL   RBC 4.48 4.22 - 5.81 Mil/uL   Hemoglobin 13.4 13.0 - 17.0 g/dL   HCT 38.9 (L) 39.0 - 52.0 %   MCV 86.7 78.0 - 100.0 fl   MCHC 34.6 30.0 - 36.0 g/dL   RDW 13.8 11.5 - 15.5 %   Platelets 191.0 150.0 - 400.0 K/uL   Neutrophils Relative % 59.1 43.0 - 77.0 %   Lymphocytes Relative 31.0 12.0 - 46.0 %   Monocytes Relative 7.1 3.0 - 12.0 %   Eosinophils Relative 2.3 0.0 - 5.0 %   Basophils Relative 0.5 0.0 - 3.0 %   Neutro Abs 3.7 1.4 - 7.7 K/uL   Lymphs Abs 1.9 0.7 - 4.0 K/uL   Monocytes Absolute 0.4 0.1 - 1.0 K/uL    Eosinophils Absolute 0.1 0.0 - 0.7 K/uL   Basophils Absolute 0.0 0.0 - 0.1 K/uL  Comprehensive metabolic panel  Result Value Ref Range   Sodium 138 135 - 145 mEq/L   Potassium 4.6 3.5 - 5.1 mEq/L   Chloride 103 96 - 112 mEq/L   CO2 29 19 - 32 mEq/L   Glucose, Bld 111 (H) 70 - 99 mg/dL   BUN 21 6 - 23 mg/dL   Creatinine, Ser 1.21 0.40 - 1.50 mg/dL   Total Bilirubin 0.5 0.2 - 1.2 mg/dL   Alkaline Phosphatase 60 39 - 117 U/L   AST 15 0 - 37 U/L   ALT 11 0 - 53 U/L   Total Protein 6.3 6.0 - 8.3 g/dL   Albumin 4.2 3.5 - 5.2 g/dL   GFR 52.79 (L) >60.00 mL/min   Calcium 9.0 8.4 - 10.5 mg/dL  Lipid panel  Result Value Ref Range   Cholesterol 212 (H) 0 - 200 mg/dL   Triglycerides 119.0 0.0 - 149.0 mg/dL   HDL 49.80 >39.00 mg/dL   VLDL 23.8 0.0 - 40.0 mg/dL   LDL Cholesterol 138 (H) 0 - 99 mg/dL   Total CHOL/HDL Ratio 4    NonHDL 162.15   VITAMIN D 25 Hydroxy (Vit-D Deficiency, Fractures)  Result Value Ref Range   VITD 41.59 30.00 - 100.00 ng/mL    Assessment & Plan:  This visit occurred during the SARS-CoV-2 public health emergency.  Safety protocols were in place, including screening questions prior to the visit, additional usage of staff PPE,  and extensive cleaning of exam room while observing appropriate contact time as indicated for disinfecting solutions.   Problem List Items Addressed This Visit     Medicare annual wellness visit, subsequent - Primary (Chronic)    I have personally reviewed the Medicare Annual Wellness questionnaire and have noted 1. The patient's medical and social history 2. Their use of alcohol, tobacco or illicit drugs 3. Their current medications and supplements 4. The patient's functional ability including ADL's, fall risks, home safety risks and hearing or visual impairment. Cognitive function has been assessed and addressed as indicated.  5. Diet and physical activity 6. Evidence for depression or mood disorders The patients weight, height, BMI have  been recorded in the chart. I have made referrals, counseling and provided education to the patient based on review of the above and I have provided the pt with a written personalized care plan for preventive services. Provider list updated.. See scanned questionairre as needed for further documentation. Reviewed preventative protocols and updated unless pt declined.       Advanced care planning/counseling discussion (Chronic)    Advanced directives: scanned 07/2013 and in chart. Son is HCPOA Therapist, occupational [place]). Doesn't want prolonged life support.      Health maintenance examination (Chronic)    Preventative protocols reviewed and updated unless pt declined. Discussed healthy diet and lifestyle.       Vitamin D deficiency    Continues vit D 1000 IU daily.       HLD (hyperlipidemia)    Chronic, a bit elevated, on flax seed tablets BID. Will not start statin at this time.  The ASCVD Risk score Mikey Bussing DC Jr., et al., 2013) failed to calculate for the following reasons:   The 2013 ASCVD risk score is only valid for ages 50 to 41       Relevant Medications   aspirin (ASPIRIN 81) 81 MG EC tablet   Essential hypertension    Chronic, stable. Continue amlodipine, cardura, losartan, metoprolol.       Relevant Medications   aspirin (ASPIRIN 81) 81 MG EC tablet   Benign prostatic hyperplasia    Continue proscar, doxazosin.       Essential tremor    No longer on primidone       High frequency hearing loss    Wears hearing aides.       CKD (chronic kidney disease) stage 3, GFR 30-59 ml/min (HCC)    Chronic, stable. Continue to monitor.       Leg DVT (deep venous thromboembolism), acute, left (HCC)    Completed 3 months eliquis, now on aspirin '81mg'$  daily. However noticing significant easy bruising - will drop aspirin to MWF.       Relevant Medications   aspirin (ASPIRIN 81) 81 MG EC tablet   Elevated vitamin B12 level    Elevated levels on B12 51mg MWF - will drop dose to once  weekly.         Meds ordered this encounter  Medications   vitamin B-12 (CYANOCOBALAMIN) 500 MCG tablet    Sig: Take 1 tablet (500 mcg total) by mouth once a week.   aspirin (ASPIRIN 81) 81 MG EC tablet    Sig: Take 1 tablet (81 mg total) by mouth every Monday, Wednesday, and Friday.   famotidine (PEPCID) 20 MG tablet    Sig: Take 1 tablet (20 mg total) by mouth at bedtime.    Dispense:  90 tablet    Refill:  1   No  orders of the defined types were placed in this encounter.    Patient instructions: If interested, check with pharmacy about new 2 shot shingles series (shingrix).  Drop vitamin b12 dose to once weekly.  Good to see you today - you are doing well today.  Return as needed or in 1 year for next physical.   Follow up plan: Return in about 1 year (around 08/30/2021) for annual exam, prior fasting for blood work, medicare wellness visit.  Ria Bush, MD

## 2020-08-31 DIAGNOSIS — R7989 Other specified abnormal findings of blood chemistry: Secondary | ICD-10-CM | POA: Insufficient documentation

## 2020-08-31 DIAGNOSIS — R748 Abnormal levels of other serum enzymes: Secondary | ICD-10-CM | POA: Insufficient documentation

## 2020-08-31 NOTE — Assessment & Plan Note (Signed)
Advanced directives: scanned 07/2013 and in chart. Son is HCPOA Therapist, occupational [place]). Doesn't want prolonged life support.

## 2020-08-31 NOTE — Assessment & Plan Note (Signed)
Chronic, stable. Continue amlodipine, cardura, losartan, metoprolol.

## 2020-08-31 NOTE — Assessment & Plan Note (Signed)
Elevated levels on B12 52mg MWF - will drop dose to once weekly.

## 2020-08-31 NOTE — Assessment & Plan Note (Signed)
Continue proscar, doxazosin.

## 2020-08-31 NOTE — Assessment & Plan Note (Signed)

## 2020-08-31 NOTE — Assessment & Plan Note (Signed)
Completed 3 months eliquis, now on aspirin '81mg'$  daily. However noticing significant easy bruising - will drop aspirin to MWF.

## 2020-08-31 NOTE — Assessment & Plan Note (Signed)
Chronic, a bit elevated, on flax seed tablets BID. Will not start statin at this time.  The ASCVD Risk score Mikey Bussing DC Jr., et al., 2013) failed to calculate for the following reasons:   The 2013 ASCVD risk score is only valid for ages 58 to 57

## 2020-08-31 NOTE — Assessment & Plan Note (Signed)
Wears hearing aides 

## 2020-08-31 NOTE — Assessment & Plan Note (Signed)
Continues vit D 1000 IU daily.

## 2020-08-31 NOTE — Assessment & Plan Note (Signed)
Preventative protocols reviewed and updated unless pt declined. Discussed healthy diet and lifestyle.  

## 2020-08-31 NOTE — Assessment & Plan Note (Signed)
No longer on primidone

## 2020-08-31 NOTE — Assessment & Plan Note (Signed)
Chronic, stable. Continue to monitor.  

## 2020-10-07 ENCOUNTER — Emergency Department: Admit: 2020-10-07 | Payer: MEDICARE | Primary: Family Medicine

## 2020-10-07 ENCOUNTER — Inpatient Hospital Stay
Admit: 2020-10-07 | Discharge: 2020-10-09 | Disposition: A | Discharge status: Home / Self Care | Payer: MEDICARE | Attending: Internal Medicine | Admitting: Internal Medicine

## 2020-10-07 ENCOUNTER — Emergency Department: Payer: MEDICARE | Primary: Family Medicine

## 2020-10-07 DIAGNOSIS — I214 Non-ST elevation (NSTEMI) myocardial infarction: Secondary | ICD-10-CM

## 2020-10-07 DIAGNOSIS — I129 Hypertensive chronic kidney disease with stage 1 through stage 4 chronic kidney disease, or unspecified chronic kidney disease: Secondary | ICD-10-CM | POA: Diagnosis not present

## 2020-10-07 DIAGNOSIS — I5022 Chronic systolic (congestive) heart failure: Secondary | ICD-10-CM | POA: Diagnosis not present

## 2020-10-07 DIAGNOSIS — Z299 Encounter for prophylactic measures, unspecified: Secondary | ICD-10-CM | POA: Diagnosis not present

## 2020-10-07 DIAGNOSIS — N1831 Chronic kidney disease, stage 3a: Secondary | ICD-10-CM | POA: Diagnosis not present

## 2020-10-07 DIAGNOSIS — I10 Essential (primary) hypertension: Secondary | ICD-10-CM | POA: Diagnosis not present

## 2020-10-07 DIAGNOSIS — N189 Chronic kidney disease, unspecified: Secondary | ICD-10-CM | POA: Diagnosis not present

## 2020-10-07 DIAGNOSIS — I130 Hypertensive heart and chronic kidney disease with heart failure and stage 1 through stage 4 chronic kidney disease, or unspecified chronic kidney disease: Secondary | ICD-10-CM | POA: Diagnosis not present

## 2020-10-07 DIAGNOSIS — Z86718 Personal history of other venous thrombosis and embolism: Secondary | ICD-10-CM | POA: Diagnosis not present

## 2020-10-07 DIAGNOSIS — R079 Chest pain, unspecified: Secondary | ICD-10-CM | POA: Diagnosis not present

## 2020-10-07 DIAGNOSIS — N179 Acute kidney failure, unspecified: Secondary | ICD-10-CM | POA: Diagnosis not present

## 2020-10-07 DIAGNOSIS — G250 Essential tremor: Secondary | ICD-10-CM | POA: Diagnosis not present

## 2020-10-07 DIAGNOSIS — I358 Other nonrheumatic aortic valve disorders: Secondary | ICD-10-CM | POA: Diagnosis not present

## 2020-10-07 DIAGNOSIS — I5020 Unspecified systolic (congestive) heart failure: Secondary | ICD-10-CM | POA: Diagnosis not present

## 2020-10-07 DIAGNOSIS — K219 Gastro-esophageal reflux disease without esophagitis: Secondary | ICD-10-CM | POA: Diagnosis not present

## 2020-10-07 LAB — CBC WITH AUTOMATED DIFF
ABS. BASOPHILS: 0 10*3/uL (ref 0.0–0.1)
ABS. EOSINOPHILS: 0.2 10*3/uL (ref 0.0–0.4)
ABS. IMM. GRANS.: 0 10*3/uL (ref 0.00–0.04)
ABS. LYMPHOCYTES: 2.4 10*3/uL (ref 0.8–3.5)
ABS. MONOCYTES: 0.8 10*3/uL (ref 0.0–1.0)
ABS. NEUTROPHILS: 6.3 10*3/uL (ref 1.8–8.0)
ABSOLUTE NRBC: 0 10*3/uL (ref 0.00–0.01)
BASOPHILS: 0 % (ref 0–1)
EOSINOPHILS: 2 % (ref 0–7)
HCT: 38.4 % (ref 36.6–50.3)
HGB: 13.4 g/dL (ref 12.1–17.0)
IMMATURE GRANULOCYTES: 0 % (ref 0.0–0.5)
LYMPHOCYTES: 25 % (ref 12–49)
MCH: 29.9 PG (ref 26.0–34.0)
MCHC: 34.9 g/dL (ref 30.0–36.5)
MCV: 85.7 FL (ref 80.0–99.0)
MONOCYTES: 8 % (ref 5–13)
MPV: 9.4 FL (ref 8.9–12.9)
NEUTROPHILS: 65 % (ref 32–75)
NRBC: 0 PER 100 WBC
PLATELET: 200 10*3/uL (ref 150–400)
RBC: 4.48 M/uL (ref 4.10–5.70)
RDW: 12.7 % (ref 11.5–14.5)
WBC: 9.7 10*3/uL (ref 4.1–11.1)

## 2020-10-07 LAB — POC ACTIVATED CLOTTING TIME
Activated Clotting Time (POC): 190 SECS — ABNORMAL HIGH (ref 79–138)
Activated Clotting Time (POC): 242 SECS — ABNORMAL HIGH (ref 79–138)
Activated Clotting Time (POC): 283 SECS — ABNORMAL HIGH (ref 79–138)
Activated Clotting Time (POC): 288 SECS — ABNORMAL HIGH (ref 79–138)
Activated Clotting Time: 190 SECS — ABNORMAL HIGH (ref 79–138)
Activated Clotting Time: 242 SECS — ABNORMAL HIGH (ref 79–138)
Activated Clotting Time: 283 SECS — ABNORMAL HIGH (ref 79–138)
Activated Clotting Time: 288 SECS — ABNORMAL HIGH (ref 79–138)

## 2020-10-07 LAB — NT-PRO BNP: NT pro-BNP: 4383 PG/ML — ABNORMAL HIGH (ref <450)

## 2020-10-07 LAB — METABOLIC PANEL, COMPREHENSIVE
A-G Ratio: 1.4 (ref 1.1–2.2)
ALT (SGPT): 26 U/L (ref 12–78)
AST (SGOT): 95 U/L — ABNORMAL HIGH (ref 15–37)
Albumin: 3.9 g/dL (ref 3.5–5.0)
Alk. phosphatase: 92 U/L (ref 45–117)
Anion gap: 6 mmol/L (ref 5–15)
BUN/Creatinine ratio: 15 (ref 12–20)
BUN: 18 MG/DL (ref 6–20)
Bilirubin, total: 0.5 MG/DL (ref 0.2–1.0)
CO2: 32 mmol/L (ref 21–32)
Calcium: 9.7 MG/DL (ref 8.5–10.1)
Chloride: 100 mmol/L (ref 97–108)
Creatinine: 1.24 MG/DL (ref 0.70–1.30)
GFR est AA: >60 mL/min/{1.73_m2} (ref >60)
GFR est non-AA: 55 mL/min/{1.73_m2} — ABNORMAL LOW (ref >60)
Globulin: 2.8 g/dL (ref 2.0–4.0)
Glucose: 142 mg/dL — ABNORMAL HIGH (ref 65–100)
Potassium: 4.4 mmol/L (ref 3.5–5.1)
Protein, total: 6.7 g/dL (ref 6.4–8.2)
Sodium: 138 mmol/L (ref 136–145)

## 2020-10-07 LAB — ECHO ADULT COMPLETE
AR Max Velocity PISA: 2.8 m/s
AR PHT: 639.4 millisecond
AV Area by Peak Velocity: 1.7 cm2
AV Area by VTI: 1.9 cm2
AV Mean Gradient: 6 mmHg
AV Mean Velocity: 1.2 m/s
AV Peak Gradient: 12 mmHg
AV Peak Velocity: 1.7 m/s
AV VTI: 32.9 cm
AV Velocity Ratio: 0.47
AVA/BSA Peak Velocity: 0.8 cm2/m2
AVA/BSA VTI: 0.9 cm2/m2
Ascending Aorta Index: 1.23 cm/m2
Ascending Aorta: 2.5 cm
Fractional Shortening 2D: 24 % (ref 28–44)
IVSd: 1.2 cm — AB (ref 0.6–1.0)
LA Diameter: 3.2 cm
LA Size Index: 1.57 cm/m2
LV EDV A4C: 128 mL
LV EDV Index A4C: 63 mL/m2
LV ESV A4C: 61 mL
LV ESV Index A4C: 30 mL/m2
LV Ejection Fraction A4C: 52 %
LV Mass 2D Index: 108 g/m2 (ref 49–115)
LV Mass 2D: 220.3 g (ref 88–224)
LV RWT Ratio: 0.44
LVIDd Index: 2.45 cm/m2
LVIDd: 5 cm (ref 4.2–5.9)
LVIDs Index: 1.86 cm/m2
LVIDs: 3.8 cm
LVOT Area: 3.5 cm2
LVOT Diameter: 2.1 cm
LVOT Mean Gradient: 2 mmHg
LVOT Peak Gradient: 3 mmHg
LVOT Peak Velocity: 0.8 m/s
LVOT SV: 61.6 ml
LVOT Stroke Volume Index: 30.2 mL/m2
LVOT VTI: 17.8 cm
LVOT:AV VTI Index: 0.54
LVPWd: 1.1 cm — AB (ref 0.6–1.0)
TAPSE: 1.7 cm (ref 1.7–?)
TR Max Velocity: 3.38 m/s
TR Peak Gradient: 46 mmHg

## 2020-10-07 LAB — TROPONIN-HIGH SENSITIVITY
Troponin-High Sensitivity: 11876 ng/L — CR (ref 0–76)
Troponin-High Sensitivity: 16107 ng/L — CR (ref 0–76)

## 2020-10-07 LAB — D DIMER: D-dimer: 0.24 mg/L FEU (ref 0.00–0.65)

## 2020-10-07 LAB — PTT: aPTT: 26.6 s (ref 22.1–31.0)

## 2020-10-07 LAB — COMPREHENSIVE METABOLIC PANEL
ALT: 26 U/L (ref 12–78)
AST: 95 U/L — ABNORMAL HIGH (ref 15–37)
Albumin/Globulin Ratio: 1.4 (ref 1.1–2.2)
Albumin: 3.9 g/dL (ref 3.5–5.0)
Alkaline Phosphatase: 92 U/L (ref 45–117)
Anion Gap: 6 mmol/L (ref 5–15)
BUN: 18 MG/DL (ref 6–20)
Bun/Cre Ratio: 15 (ref 12–20)
CO2: 32 mmol/L (ref 21–32)
Calcium: 9.7 MG/DL (ref 8.5–10.1)
Chloride: 100 mmol/L (ref 97–108)
Creatinine: 1.24 MG/DL (ref 0.70–1.30)
EGFR IF NonAfrican American: 55 mL/min/{1.73_m2} — ABNORMAL LOW (ref >60)
GFR African American: >60 mL/min/{1.73_m2} (ref >60)
Globulin: 2.8 g/dL (ref 2.0–4.0)
Glucose: 142 mg/dL — ABNORMAL HIGH (ref 65–100)
Potassium: 4.4 mmol/L (ref 3.5–5.1)
Sodium: 138 mmol/L (ref 136–145)
Total Bilirubin: 0.5 MG/DL (ref 0.2–1.0)
Total Protein: 6.7 g/dL (ref 6.4–8.2)

## 2020-10-07 LAB — TRANSTHORACIC ECHOCARDIOGRAM (TTE) COMPLETE (CONTRAST/BUBBLE/3D PRN)
AR Max Velocity PISA: 2.8 m/s
AR PHT: 639.4 millisecond
AV Area by Peak Velocity: 1.7 cm2
AV Area by VTI: 1.9 cm2
AV Mean Gradient: 6 mmHg
AV Mean Velocity: 1.2 m/s
AV Peak Gradient: 12 mmHg
AV Peak Velocity: 1.7 m/s
AV VTI: 32.9 cm
AV Velocity Ratio: 0.47
AVA/BSA Peak Velocity: 0.8 cm2/m2
AVA/BSA VTI: 0.9 cm2/m2
Ascending Aorta Index: 1.23 cm/m2
Ascending Aorta: 2.5 cm
Fractional Shortening 2D: 24 % (ref 28–44)
IVSd: 1.2 cm — AB (ref 0.6–1)
LA Diameter: 3.2 cm
LA Size Index: 1.57 cm/m2
LV EDV A4C: 128 mL
LV EDV Index A4C: 63 mL/m2
LV ESV A4C: 61 mL
LV ESV Index A4C: 30 mL/m2
LV Ejection Fraction A4C: 52 %
LV Mass 2D Index: 108 g/m2 (ref 49–115)
LV Mass 2D: 220.3 g (ref 88–224)
LV RWT Ratio: 0.44
LVIDd Index: 2.45 cm/m2
LVIDd: 5 cm (ref 4.2–5.9)
LVIDs Index: 1.86 cm/m2
LVIDs: 3.8 cm
LVOT Area: 3.5 cm2
LVOT Diameter: 2.1 cm
LVOT Mean Gradient: 2 mmHg
LVOT Peak Gradient: 3 mmHg
LVOT Peak Velocity: 0.8 m/s
LVOT SV: 61.6 ml
LVOT Stroke Volume Index: 30.2 mL/m2
LVOT VTI: 17.8 cm
LVOT:AV VTI Index: 0.54
LVPWd: 1.1 cm — AB (ref 0.6–1)
Left Ventricular Ejection Fraction: 38
TAPSE: 1.7 cm
TR Max Velocity: 3.38 m/s
TR Peak Gradient: 46 mmHg

## 2020-10-07 LAB — CBC WITH AUTO DIFFERENTIAL
Basophils %: 0 % (ref 0–1)
Basophils Absolute: 0 10*3/uL (ref 0.0–0.1)
Eosinophils %: 2 % (ref 0–7)
Eosinophils Absolute: 0.2 10*3/uL (ref 0.0–0.4)
Granulocyte Absolute Count: 0 10*3/uL (ref 0.00–0.04)
Hematocrit: 38.4 % (ref 36.6–50.3)
Hemoglobin: 13.4 g/dL (ref 12.1–17.0)
Immature Granulocytes: 0 % (ref 0.0–0.5)
Lymphocytes %: 25 % (ref 12–49)
Lymphocytes Absolute: 2.4 10*3/uL (ref 0.8–3.5)
MCH: 29.9 PG (ref 26.0–34.0)
MCHC: 34.9 g/dL (ref 30.0–36.5)
MCV: 85.7 FL (ref 80.0–99.0)
MPV: 9.4 FL (ref 8.9–12.9)
Monocytes %: 8 % (ref 5–13)
Monocytes Absolute: 0.8 10*3/uL (ref 0.0–1.0)
NRBC Absolute: 0 10*3/uL (ref 0.00–0.01)
Neutrophils %: 65 % (ref 32–75)
Neutrophils Absolute: 6.3 10*3/uL (ref 1.8–8.0)
Nucleated RBCs: 0 PER 100 WBC
Platelets: 200 10*3/uL (ref 150–400)
RBC: 4.48 M/uL (ref 4.10–5.70)
RDW: 12.7 % (ref 11.5–14.5)
WBC: 9.7 10*3/uL (ref 4.1–11.1)

## 2020-10-07 LAB — TROPONIN, HIGH SENSITIVITY
Troponin, High Sensitivity: 11876 ng/L (ref 0–76)
Troponin, High Sensitivity: 16107 ng/L (ref 0–76)

## 2020-10-07 LAB — APTT: aPTT: 26.6 s (ref 22.1–31.0)

## 2020-10-07 LAB — D-DIMER, QUANTITATIVE: D-Dimer, Quant: 0.24 mg/L FEU (ref 0.00–0.65)

## 2020-10-07 LAB — PROBNP, N-TERMINAL: BNP: 4383 PG/ML — ABNORMAL HIGH (ref <450)

## 2020-10-07 MED ORDER — CLOPIDOGREL 300 MG TAB
300 mg | ORAL | Status: AC
Start: 2020-10-07 — End: ?

## 2020-10-07 MED ORDER — HEPARIN (PORCINE) IN NS (PF) 1,000 UNIT/500 ML IV
1000 unit/500 mL | INTRAVENOUS | Status: AC | PRN
Start: 2020-10-07 — End: 2020-10-07
  Administered 2020-10-07: 16:00:00

## 2020-10-07 MED ORDER — IOPAMIDOL 76 % IV SOLN
370 mg iodine /mL (76 %) | INTRAVENOUS | Status: AC
Start: 2020-10-07 — End: ?

## 2020-10-07 MED ORDER — LIDOCAINE (PF) 10 MG/ML (1 %) IJ SOLN
10 mg/mL (1 %) | INTRAMUSCULAR | Status: DC | PRN
Start: 2020-10-07 — End: 2020-10-07
  Administered 2020-10-07: 16:00:00 via INTRADERMAL

## 2020-10-07 MED ORDER — NITROGLYCERIN 2 % TRANSDERMAL OINTMENT
2 % | Freq: Four times a day (QID) | TRANSDERMAL | Status: AC | PRN
Start: 2020-10-07 — End: 2020-10-09

## 2020-10-07 MED ORDER — METOPROLOL TARTRATE 25 MG TAB
25 mg | Freq: Two times a day (BID) | ORAL | Status: AC
Start: 2020-10-07 — End: 2020-10-08
  Administered 2020-10-07 – 2020-10-08 (×3): via ORAL

## 2020-10-07 MED ORDER — MORPHINE 2 MG/ML INJECTION
2 mg/mL | INTRAMUSCULAR | Status: AC | PRN
Start: 2020-10-07 — End: 2020-10-08

## 2020-10-07 MED ORDER — HEPARIN (PORCINE) 1,000 UNIT/ML IJ SOLN
1000 unit/mL | INTRAMUSCULAR | Status: AC
Start: 2020-10-07 — End: ?

## 2020-10-07 MED ORDER — SODIUM CHLORIDE 0.9 % IV
INTRAVENOUS | Status: DC
Start: 2020-10-07 — End: 2020-10-09
  Administered 2020-10-07 – 2020-10-08 (×4): via INTRAVENOUS

## 2020-10-07 MED ORDER — ISOSORBIDE MONONITRATE SR 30 MG 24 HR TAB
30 mg | Freq: Every day | ORAL | Status: AC
Start: 2020-10-07 — End: 2020-10-09
  Administered 2020-10-07 – 2020-10-09 (×3): via ORAL

## 2020-10-07 MED ORDER — HEPARIN (PORCINE) 1,000 UNIT/ML IJ SOLN
1000 unit/mL | Freq: Once | INTRAMUSCULAR | Status: AC
Start: 2020-10-07 — End: 2020-10-07
  Administered 2020-10-07: 12:00:00 via INTRAVENOUS

## 2020-10-07 MED ORDER — FENTANYL CITRATE (PF) 50 MCG/ML IJ SOLN
50 mcg/mL | INTRAMUSCULAR | Status: AC
Start: 2020-10-07 — End: ?

## 2020-10-07 MED ORDER — PERFLUTREN LIPID MICROSPHERES 1.1 MG/ML IV
1.1 mg/mL | Freq: Once | INTRAVENOUS | Status: AC
Start: 2020-10-07 — End: 2020-10-07
  Administered 2020-10-07: 22:00:00 via INTRAVENOUS

## 2020-10-07 MED ORDER — HEPARIN (PORCINE) IN D5W 25,000 UNIT/250 ML IV
25000 unit/250 mL(100 unit/mL) | INTRAVENOUS | Status: DC
Start: 2020-10-07 — End: 2020-10-07
  Administered 2020-10-07: 12:00:00 via INTRAVENOUS

## 2020-10-07 MED ORDER — VERAPAMIL 2.5 MG/ML IV
2.5 mg/mL | INTRAVENOUS | Status: DC | PRN
Start: 2020-10-07 — End: 2020-10-07
  Administered 2020-10-07: 16:00:00 via INTRA_ARTERIAL

## 2020-10-07 MED ORDER — HEPARIN (PORCINE) 1,000 UNIT/ML IJ SOLN
1000 unit/mL | INTRAMUSCULAR | Status: DC | PRN
Start: 2020-10-07 — End: 2020-10-07

## 2020-10-07 MED ORDER — ACETAMINOPHEN 325 MG TABLET
325 mg | Freq: Four times a day (QID) | ORAL | Status: DC | PRN
Start: 2020-10-07 — End: 2020-10-09
  Administered 2020-10-07 – 2020-10-09 (×4): via ORAL

## 2020-10-07 MED ORDER — MIDAZOLAM 1 MG/ML IJ SOLN
1 mg/mL | INTRAMUSCULAR | Status: DC | PRN
Start: 2020-10-07 — End: 2020-10-07
  Administered 2020-10-07 (×2): via INTRAVENOUS

## 2020-10-07 MED ORDER — HEPARIN (PORCINE) IN NS (PF) 1,000 UNIT/500 ML IV
1000 unit/500 mL | INTRAVENOUS | Status: AC
Start: 2020-10-07 — End: ?

## 2020-10-07 MED ORDER — IOPAMIDOL 76 % IV SOLN
76 % | INTRAVENOUS | Status: AC
Start: 2020-10-07 — End: ?

## 2020-10-07 MED ORDER — LIDOCAINE (PF) 10 MG/ML (1 %) IJ SOLN
10 mg/mL (1 %) | INTRAMUSCULAR | Status: AC
Start: 2020-10-07 — End: ?

## 2020-10-07 MED ORDER — HEPARIN (PORCINE) 1,000 UNIT/ML IJ SOLN
1000 unit/mL | INTRAMUSCULAR | Status: DC | PRN
Start: 2020-10-07 — End: 2020-10-07
  Administered 2020-10-07 (×5): via INTRAVENOUS

## 2020-10-07 MED ORDER — SODIUM CHLORIDE 0.9 % IJ SYRG
Freq: Three times a day (TID) | INTRAMUSCULAR | Status: AC
Start: 2020-10-07 — End: 2020-10-09
  Administered 2020-10-07 – 2020-10-09 (×7): via INTRAVENOUS

## 2020-10-07 MED ORDER — SODIUM CHLORIDE 0.9 % IJ SYRG
INTRAMUSCULAR | Status: AC | PRN
Start: 2020-10-07 — End: 2020-10-09

## 2020-10-07 MED ORDER — NITROGLYCERIN 2 % TRANSDERMAL OINTMENT
2 % | TRANSDERMAL | Status: AC
Start: 2020-10-07 — End: ?

## 2020-10-07 MED ORDER — NITROGLYCERIN 0.1 MG/ML (100 MCG/ML) D5W COMPOUNDED INJECTION
0.1 mg/mL | INTRAVENOUS | Status: AC
Start: 2020-10-07 — End: ?

## 2020-10-07 MED ORDER — ONDANSETRON (PF) 4 MG/2 ML INJECTION
4 mg/2 mL | Freq: Four times a day (QID) | INTRAMUSCULAR | Status: AC | PRN
Start: 2020-10-07 — End: 2020-10-09

## 2020-10-07 MED ORDER — ACETAMINOPHEN 650 MG RECTAL SUPPOSITORY
650 mg | Freq: Four times a day (QID) | RECTAL | Status: AC | PRN
Start: 2020-10-07 — End: 2020-10-09

## 2020-10-07 MED ORDER — NITROGLYCERIN 0.1 MG/ML (100 MCG/ML) D5W COMPOUNDED INJECTION
0.1 mg/mL | INTRAVENOUS | Status: DC | PRN
Start: 2020-10-07 — End: 2020-10-07
  Administered 2020-10-07 (×3): via INTRACORONARY

## 2020-10-07 MED ORDER — IOPAMIDOL 76 % IV SOLN
76 % | INTRAVENOUS | Status: DC | PRN
Start: 2020-10-07 — End: 2020-10-07
  Administered 2020-10-07 (×4)

## 2020-10-07 MED ORDER — POLYETHYLENE GLYCOL 3350 17 GRAM (100 %) ORAL POWDER PACKET
17 gram | Freq: Every day | ORAL | Status: AC | PRN
Start: 2020-10-07 — End: 2020-10-09

## 2020-10-07 MED ORDER — CLOPIDOGREL 75 MG TAB
75 mg | ORAL | Status: DC | PRN
Start: 2020-10-07 — End: 2020-10-07
  Administered 2020-10-07: 18:00:00 via ORAL

## 2020-10-07 MED ORDER — ONDANSETRON 4 MG TAB, RAPID DISSOLVE
4 mg | Freq: Three times a day (TID) | ORAL | Status: DC | PRN
Start: 2020-10-07 — End: 2020-10-09

## 2020-10-07 MED ORDER — MIDAZOLAM 1 MG/ML INJECTION (NDG ONLY)
1 mg/mL | INTRAMUSCULAR | Status: AC
Start: 2020-10-07 — End: ?

## 2020-10-07 MED ORDER — OXYCODONE 5 MG TAB
5 mg | ORAL | Status: AC | PRN
Start: 2020-10-07 — End: 2020-10-08
  Administered 2020-10-08: 13:00:00 via ORAL

## 2020-10-07 MED ORDER — ATORVASTATIN 20 MG TAB
20 mg | Freq: Every evening | ORAL | Status: DC
Start: 2020-10-07 — End: 2020-10-08
  Administered 2020-10-08 (×2): via ORAL

## 2020-10-07 MED ORDER — ASPIRIN 81 MG CHEWABLE TAB
81 mg | Freq: Every day | ORAL | Status: AC
Start: 2020-10-07 — End: 2020-10-09
  Administered 2020-10-07 – 2020-10-09 (×3): via ORAL

## 2020-10-07 MED ORDER — FENTANYL CITRATE (PF) 50 MCG/ML IJ SOLN
50 mcg/mL | INTRAMUSCULAR | Status: DC | PRN
Start: 2020-10-07 — End: 2020-10-07
  Administered 2020-10-07 (×7): via INTRAVENOUS

## 2020-10-07 MED ORDER — ASPIRIN 81 MG CHEWABLE TAB
81 mg | ORAL | Status: AC
Start: 2020-10-07 — End: 2020-10-07
  Administered 2020-10-07: 11:00:00 via ORAL

## 2020-10-07 MED ORDER — CLOPIDOGREL 75 MG TAB
75 mg | Freq: Every day | ORAL | Status: AC
Start: 2020-10-07 — End: 2020-10-09
  Administered 2020-10-08 – 2020-10-09 (×2): via ORAL

## 2020-10-07 MED ORDER — VERAPAMIL 2.5 MG/ML IV
2.5 mg/mL | INTRAVENOUS | Status: AC
Start: 2020-10-07 — End: ?

## 2020-10-07 MED ORDER — NITROGLYCERIN 2 % TRANSDERMAL OINTMENT
2 % | Freq: Four times a day (QID) | TRANSDERMAL | Status: DC
Start: 2020-10-07 — End: 2020-10-07
  Administered 2020-10-07 (×2): via TOPICAL

## 2020-10-07 MED ORDER — NITROGLYCERIN 2 % TRANSDERMAL OINTMENT
2 % | TRANSDERMAL | Status: DC | PRN
Start: 2020-10-07 — End: 2020-10-07
  Administered 2020-10-07: 18:00:00 via TOPICAL

## 2020-10-07 MED FILL — ISOVUE-370  76 % INTRAVENOUS SOLUTION: 370 mg iodine /mL (76 %) | INTRAVENOUS | Qty: 300

## 2020-10-07 MED FILL — METOPROLOL TARTRATE 25 MG TAB: 25 mg | ORAL | Qty: 1

## 2020-10-07 MED FILL — FENTANYL CITRATE (PF) 50 MCG/ML IJ SOLN: 50 mcg/mL | INTRAMUSCULAR | Qty: 2

## 2020-10-07 MED FILL — HEPARIN (PORCINE) 1,000 UNIT/ML IJ SOLN: 1000 unit/mL | INTRAMUSCULAR | Qty: 10

## 2020-10-07 MED FILL — ASPIRIN 81 MG CHEWABLE TAB: 81 mg | ORAL | Qty: 1

## 2020-10-07 MED FILL — NITRO-BID 2 % TRANSDERMAL OINTMENT: 2 % | TRANSDERMAL | Qty: 1

## 2020-10-07 MED FILL — VERAPAMIL 2.5 MG/ML IV: 2.5 mg/mL | INTRAVENOUS | Qty: 2

## 2020-10-07 MED FILL — MIDAZOLAM 1 MG/ML IJ SOLN: 1 mg/mL | INTRAMUSCULAR | Qty: 5

## 2020-10-07 MED FILL — SODIUM CHLORIDE 0.9 % IV: INTRAVENOUS | Qty: 1000

## 2020-10-07 MED FILL — LIDOCAINE (PF) 10 MG/ML (1 %) IJ SOLN: 10 mg/mL (1 %) | INTRAMUSCULAR | Qty: 5

## 2020-10-07 MED FILL — BD POSIFLUSH NORMAL SALINE 0.9 % INJECTION SYRINGE: INTRAMUSCULAR | Qty: 40

## 2020-10-07 MED FILL — CLOPIDOGREL 300 MG TAB: 300 mg | ORAL | Qty: 2

## 2020-10-07 MED FILL — ISOVUE-370  76 % INTRAVENOUS SOLUTION: 370 mg iodine /mL (76 %) | INTRAVENOUS | Qty: 200

## 2020-10-07 MED FILL — ACEPHEN 650 MG RECTAL SUPPOSITORY: 650 mg | RECTAL | Qty: 1

## 2020-10-07 MED FILL — ASPIRIN 81 MG CHEWABLE TAB: 81 mg | ORAL | Qty: 3

## 2020-10-07 MED FILL — HEPARIN (PORCINE) IN NS (PF) 1,000 UNIT/500 ML IV: 1000 unit/500 mL | INTRAVENOUS | Qty: 1500

## 2020-10-07 MED FILL — ISOSORBIDE MONONITRATE SR 30 MG 24 HR TAB: 30 mg | ORAL | Qty: 1

## 2020-10-07 MED FILL — ACETAMINOPHEN 650 MG RECTAL SUPPOSITORY: 650 mg | RECTAL | Qty: 1

## 2020-10-07 MED FILL — NITROGLYCERIN 0.1 MG/ML (100 MCG/ML) D5W COMPOUNDED INJECTION: 0.1 mg/mL | INTRAVENOUS | Qty: 10

## 2020-10-07 MED FILL — MAPAP (ACETAMINOPHEN) 325 MG TABLET: 325 mg | ORAL | Qty: 2

## 2020-10-07 MED FILL — HEPARIN (PORCINE) IN D5W 25,000 UNIT/250 ML IV: 25000 unit/250 mL(100 unit/mL) | INTRAVENOUS | Qty: 250

## 2020-10-07 NOTE — Progress Notes (Signed)
Problem: Falls - Risk of  Goal: *Absence of Falls  Description: Document John Blevins Fall Risk and appropriate interventions in the flowsheet.  Outcome: Progressing Towards Goal  Note: Fall Risk Interventions:            Medication Interventions: Assess postural VS orthostatic hypotension, Bed/chair exit alarm, Evaluate medications/consider consulting pharmacy, Patient to call before getting OOB, Teach patient to arise slowly                   Problem: Patient Education: Go to Patient Education Activity  Goal: Patient/Family Education  Outcome: Progressing Towards Goal     Problem: Pain  Goal: *Control of Pain  Outcome: Progressing Towards Goal  Goal: *PALLIATIVE CARE:  Alleviation of Pain  Outcome: Progressing Towards Goal     Problem: Patient Education: Go to Patient Education Activity  Goal: Patient/Family Education  Outcome: Progressing Towards Goal     Problem: Pressure Injury - Risk of  Goal: *Prevention of pressure injury  Description: Document Braden Scale and appropriate interventions in the flowsheet.  Outcome: Progressing Towards Goal  Note: Pressure Injury Interventions:            Activity Interventions: Increase time out of bed                               Problem: Patient Education: Go to Patient Education Activity  Goal: Patient/Family Education  Outcome: Progressing Towards Goal     Problem: Patient Education: Go to Patient Education Activity  Goal: Patient/Family Education  Outcome: Progressing Towards Goal     Problem: Cath Lab Procedures: Post-Cath Day of Procedure (Initiate SCIP Measures for Post-Op Care)  Goal: Off Pathway (Use only if patient is Off Pathway)  Outcome: Progressing Towards Goal  Goal: Activity/Safety  Outcome: Progressing Towards Goal  Goal: Consults, if ordered  Outcome: Progressing Towards Goal  Goal: Diagnostic Test/Procedures  Outcome: Progressing Towards Goal  Goal: Nutrition/Diet  Outcome: Progressing Towards Goal  Goal: Discharge Planning  Outcome: Progressing Towards  Goal  Goal: Medications  Outcome: Progressing Towards Goal  Goal: Respiratory  Outcome: Progressing Towards Goal  Goal: Treatments/Interventions/Procedures  Outcome: Progressing Towards Goal  Goal: Psychosocial  Outcome: Progressing Towards Goal  Goal: *Procedure site is without bleeding and signs of infection six hours post sheath removal  Outcome: Progressing Towards Goal  Goal: *Hemodynamically stable  Outcome: Progressing Towards Goal  Goal: *Optimal pain control at patient's stated goal  Outcome: Progressing Towards Goal

## 2020-10-07 NOTE — Progress Notes (Signed)
ECHO attempted, Pt in CCL. Will attempt again tomorrow.

## 2020-10-07 NOTE — ED Notes (Signed)
Lab called and stated to RN [place] troponin [place] 11186. Provider aware and at bedside

## 2020-10-07 NOTE — Progress Notes (Signed)
Pt seen   No chest pain   Says feels better, had chest pain going in the procedure but now it is gone .Marland Kitchen   Discussed about result of echocardiogram, EF 35-40%, apical / distal anteroseptal hypokinesis

## 2020-10-07 NOTE — Progress Notes (Signed)
Bedside shift change report given to Waree Panyason, RN (oncoming nurse) by Terry Cruggs, RN (offgoing nurse). Report included the following information SBAR, Kardex, Procedure Summary, Intake/Output, MAR, Accordion, Recent Results, Med Rec Status, Cardiac Rhythm NSR, and Alarm Parameters .

## 2020-10-07 NOTE — Progress Notes (Signed)
Pt. is restless and unable to sleep since yesterday night.   He is impulsive and trying to get out of bed without assistant. Pt. is A&O x4 with periods of forgetfulness and questionable dementia per observation.  RN notified NP Victorino Dike and received order for Melatonin 3 mg PO and Sitter order if needed.

## 2020-10-07 NOTE — ED Notes (Signed)
 Patient is being transferred to [facility] Interventional Cardiology, Room # 2157.  Report given to [facility] Nurse, RN on John Blevins for routine progression of care.  Report consisted of the following information SBAR, Kardex, ED Summary, and MAR.  Patient transferred to receiving unit by: Transport (RN or tech name).    Outstanding consults needed: No     Next labs due: No     The following personal items will be sent with the patient during transfer to the floor:    All valuables:    Cardiac monitoring ordered: Yes    The following CURRENT information was reported to the receiving RN:    Code status: Full Code at time of transfer    Last set of vital signs:  Vital Signs  Level of Consciousness: Alert (0) (10/07/20 0715)  Temp: 97.9 F (36.6 C) (10/07/20 0715)  Temp Source: Oral (10/07/20 0715)  Pulse (Heart Rate): 65 (10/07/20 0715)  Heart Rate Source: Monitor (10/07/20 0520)  Cardiac Rhythm: Sinus Rhythm (10/07/20 0532)  Resp Rate: 22 (10/07/20 0715)  BP: (!) 167/89 (10/07/20 0715)  MAP (Monitor): 111 (10/07/20 0715)  MAP (Calculated): 115 (10/07/20 0715)  BP 1 Location: Left upper arm (10/07/20 0520)  BP 1 Method: Automatic (10/07/20 0520)  BP Patient Position: At rest;Sitting (10/07/20 0520)  MEWS Score: 2 (10/07/20 0715)         Oxygen Therapy  O2 Sat (%): 97 % (10/07/20 0715)  Pulse via Oximetry: 65 beats per minute (10/07/20 0715)  O2 Device: None (Room air) (10/07/20 0531)      Last pain assessment:  Pain 1  Pain Scale 1: Numeric (0 - 10)  Pain Intensity 1: 5  Pain Location 1: Chest      Wounds: No     Urinary catheter: voiding  Is there a foley order: No     LDAs:       Peripheral IV 10/07/20 Left Antecubital (Active)   Site Assessment Clean, dry, & intact 10/07/20 0540   Phlebitis Assessment 0 10/07/20 0540   Infiltration Assessment 0 10/07/20 0540   Dressing Status Clean, dry, & intact 10/07/20 0540   Hub Color/Line Status Pink 10/07/20 0540       Peripheral IV 10/07/20 Right Antecubital (Active)          Opportunity for questions and clarification was provided.    Tinnie Alas, RN

## 2020-10-07 NOTE — ED Provider Notes (Signed)
ED Provider Notes by Lorenda Cahill, MD at 10/07/20 [phone]                Author: Lorenda Cahill, MD  Service: --  Author Type: Physician       Filed: 10/07/20 0635  Date of Service: 10/07/20 0611  Status: Signed          Editor: Lorenda Cahill, MD (Physician)               EMERGENCY DEPARTMENT HISTORY AND PHYSICAL EXAM           Date: 10/07/2020   Patient Name: John Blevins   Patient Age and Sex: 87 y.o. male         History of Presenting Illness          Chief Complaint       Patient presents with        ?  Chest Pain             Mid sternal CP since this afternoon. Denies N/V. Denies Cardiac Hx.            History Provided By: Patient      HPI: John Blevins is a 87 year old history hypertension, CKD, prior provoked DVT presenting with chest pain.  He reports past 3 weeks has had intermittent  chest pain, unable describe the nature of the pain substernal nonradiating.  He states when the pain is severe he feels it in his arms, radiates to his bilateral shoulders.  He denies any associated nausea vomiting dyspnea or diaphoresis with the pain.   He has been taking Pepcid which typically does resolve the pain.  Patient reports he is active at the [facility], rides bikes and lifts weights 3 times a week and has no chest pain with exercise.  He notes the pain was very severe yesterday afternoon and evening.   Pain is worse after eating, particularly yesterday he ate hot Cheetos.      Mild ongoing chest pain.      There are no other complaints, changes, or physical findings at this time.      PCP: Ria Bush, MD        No current facility-administered medications on file prior to encounter.          No current outpatient medications on file prior to encounter.             Past History        Past Medical History:   Provoked DVT, hypertension, CKD      Past Surgical History:   No prior cardiothoracic surgery      Family History:   Family history early ACS      Social History:   Chewing tobacco      Allergies:   No Known  Allergies           Review of Systems     Review of Systems    Constitutional:  Negative for chills, diaphoresis and fever.    HENT:  Negative for congestion and rhinorrhea.     Respiratory:  Negative for shortness of breath.     Cardiovascular:  Positive for chest pain. Negative for palpitations and leg swelling.    Gastrointestinal:  Positive for constipation. Negative for abdominal pain, diarrhea, nausea and vomiting.    Genitourinary:  Negative for dysuria and frequency.    Musculoskeletal:  Negative for myalgias.    Skin:  Negative for rash.    Neurological:  Negative for weakness  and numbness.    All other systems reviewed and are negative.         Physical Exam     Physical Exam   Vitals and nursing note reviewed.     HENT:       Head: Normocephalic and atraumatic.       Mouth/Throat:       Mouth: Mucous membranes are moist.    Eyes:       Conjunctiva/sclera: Conjunctivae normal.     Cardiovascular:       Rate and Rhythm: Normal rate and regular rhythm.       Heart sounds: Normal heart sounds. No murmur heard.      Comments: 2+ symmetric radial pulses, 2+ symmetric DP pulses   Pulmonary:       Effort: Pulmonary effort is normal.     Abdominal:       General: Abdomen is flat.       Palpations: Abdomen is soft.       Tenderness: There is no abdominal tenderness.     Musculoskeletal:          General: No deformity.       Right lower leg: No edema.       Left lower leg: No edema.       Comments: No reproducible chest pain    Skin:      General: Skin is warm and dry.    Neurological:       Mental Status: He is alert and oriented to person, place, and time. Mental status is at baseline.    Psychiatric:          Behavior: Behavior normal.          Thought Content: Thought content normal.             Diagnostic Study Results        Labs -         Recent Results (from the past 12 hour(s))     CBC WITH AUTOMATED DIFF          Collection Time: 10/07/20  5:32 AM         Result  Value  Ref Range            WBC  9.7  4.1 -  11.1 K/uL       RBC  4.48  4.10 - 5.70 M/uL       HGB  13.4  12.1 - 17.0 g/dL       HCT  38.4  36.6 - 50.3 %       MCV  85.7  80.0 - 99.0 FL       MCH  29.9  26.0 - 34.0 PG       MCHC  34.9  30.0 - 36.5 g/dL       RDW  12.7  11.5 - 14.5 %       PLATELET  200  150 - 400 K/uL       MPV  9.4  8.9 - 12.9 FL       NRBC  0.0  0 PER 100 WBC       ABSOLUTE NRBC  0.00  0.00 - 0.01 K/uL       NEUTROPHILS  65  32 - 75 %       LYMPHOCYTES  25  12 - 49 %       MONOCYTES  8  5 - 13 %  EOSINOPHILS  2  0 - 7 %       BASOPHILS  0  0 - 1 %       IMMATURE GRANULOCYTES  0  0.0 - 0.5 %       ABS. NEUTROPHILS  6.3  1.8 - 8.0 K/UL       ABS. LYMPHOCYTES  2.4  0.8 - 3.5 K/UL       ABS. MONOCYTES  0.8  0.0 - 1.0 K/UL       ABS. EOSINOPHILS  0.2  0.0 - 0.4 K/UL       ABS. BASOPHILS  0.0  0.0 - 0.1 K/UL       ABS. IMM. GRANS.  0.0  0.00 - 0.04 K/UL       DF  AUTOMATED          METABOLIC PANEL, COMPREHENSIVE          Collection Time: 10/07/20  5:32 AM         Result  Value  Ref Range            Sodium  138  136 - 145 mmol/L       Potassium  4.4  3.5 - 5.1 mmol/L       Chloride  100  97 - 108 mmol/L       CO2  32  21 - 32 mmol/L       Anion gap  6  5 - 15 mmol/L       Glucose  142 (H)  65 - 100 mg/dL       BUN  18  6 - 20 MG/DL       Creatinine  1.24  0.70 - 1.30 MG/DL       BUN/Creatinine ratio  15  12 - 20         GFR est AA  >60  >60 ml/min/1.9m       GFR est non-AA  55 (L)  >60 ml/min/1.789m      Calcium  9.7  8.5 - 10.1 MG/DL       Bilirubin, total  0.5  0.2 - 1.0 MG/DL       ALT (SGPT)  26  12 - 78 U/L       AST (SGOT)  95 (H)  15 - 37 U/L       Alk. phosphatase  92  45 - 117 U/L       Protein, total  6.7  6.4 - 8.2 g/dL       Albumin  3.9  3.5 - 5.0 g/dL       Globulin  2.8  2.0 - 4.0 g/dL       A-G Ratio  1.4  1.1 - 2.2         NT-PRO BNP          Collection Time: 10/07/20  5:32 AM         Result  Value  Ref Range            NT pro-BNP  4,383 (H)  <450 PG/ML       TROPONIN-HIGH SENSITIVITY          Collection Time: 10/07/20   5:32 AM         Result  Value  Ref Range            Troponin-High Sensitivity  11,876 (HH)  0 - 76 ng/L  Radiologic Studies -      XR CHEST PORT       Final Result     Shallow inspiration, no infiltrate.                 CT Results  (Last 48 hours)             None                    CXR Results  (Last 48 hours)                                       10/07/20 0539    XR CHEST PORT  Final result            Impression:    Shallow inspiration, no infiltrate.                       Narrative:    Clinical indication: Chest pain.             Frontal view of the chest obtained portable. The heart size is normal. There is      no acute infiltrate.                                            Medical Decision Making     I am the first provider for this patient.      I reviewed the vital signs, available nursing notes, past medical history, past surgical history, family history and social history.      Vital Signs-Reviewed the patient's vital signs.   Patient Vitals for the past 12 hrs:            Temp  Pulse  Resp  BP  SpO2            10/07/20 0615  --  60  22  (!) 175/84  96 %            10/07/20 0536  --  66  29  (!) 173/85  94 %     10/07/20 0530  --  71  20  --  --     10/07/20 0526  --  --  --  (!) 155/91  93 %            10/07/20 0520  97.6 ??F (36.4 ??C)  67  --  (!) 148/87  100 %           Records Reviewed: Nursing Notes and Old Medical Records      Provider Notes (Medical Decision Making):    DDx reflux, ACS      Will obtain EKG troponin to evaluate for ischemic changes.  CBC CMP.  Chest x-ray to evaluate for other acute cardiopulmonary pathology.  Aspirin ordered on arrival.      ED Course:    Initial assessment performed. The patients presenting problems have been discussed, and they are in agreement with the care plan formulated and outlined with them.  I have encouraged them to ask questions as they arise throughout their visit.        ED Course as of 10/07/20 0635       Sat Oct 07, 2020        0610  EKG  shows  normal sinus rhythm with an incomplete right bundle branch block.  There are biphasic T waves in V3 V4 V5 V6 as well as 2.  [WB]     [phone]  Patient is mildly tachypneic, off anticoagulation with prior DVT borderline O2 sat, denies dyspnea,  Low risk by Wells will obtain D-dimer  [WB]     0625  Hospitalist paged for admission, heparin drip ordered [WB]              ED Course User Index   [WB] Lorenda Cahill, MD        CRITICAL CARE NOTE :      6:34 AM      IMPENDING DETERIORATION -Cardiovascular   ASSOCIATED RISK FACTORS - Dysrhythmia   MANAGEMENT- Bedside Assessment and Supervision of Care   INTERPRETATION -  ECG   INTERVENTIONS - IV heparin, admission   CASE REVIEW - Hospitalist/Intensivist, Nursing, and Family   TREATMENT RESPONSE -Stable   PERFORMED BY - Self      NOTES   :   I have spent 45 minutes of critical care time involved in lab review, consultations with specialist, family decision- making, bedside attention and documentation.  This time excludes time spent in any separate billed procedures.  During this entire length of time I was immediately available to the patient .      Lorenda Cahill, MD      Disposition:   Admission Note:   Patient is being admitted to the hospital by Dr. Marvel Plan, Service: Hospitalist.  The results of their tests and reasons for their admission have been discussed with them and available family. They  convey agreement and understanding for the need to be admitted and for their admission diagnosis.            Diagnosis        Clinical Impression:       1.  NSTEMI (non-ST elevated myocardial infarction) (HCC)            Attestations:      Lorenda Cahill, M.D.              Please note that this dictation was completed with Dragon, the computer voice recognition software.  Quite often unanticipated grammatical, syntax, homophones, and other interpretive errors are inadvertently transcribed by the computer software.  Please  disregard these errors.  Please excuse any errors that have  escaped final proofreading.  Thank you.

## 2020-10-07 NOTE — H&P (Signed)
H&P by Joanna Hews, MD at 10/07/20 1007                Author: Joanna Hews, MD  Service: Internal Medicine  Author Type: Physician       Filed: 10/07/20 1608  Date of Service: 10/07/20 1007  Status: Signed          Editor: Joanna Hews, MD (Physician)                               Hospitalist Admission Note      NAME: John Blevins    DOB:  1928-09-26    MRN:  [id number]       Date/Time:  10/07/2020 10:07 AM      Patient PCP: Ria Bush, MD   ______________________________________________________________________   Given the patient's current clinical presentation, I have a high level of concern for decompensation if discharged from the emergency department.  Complex decision making was performed, which includes  reviewing the patient's available past medical records, laboratory results, and x-ray films.         My assessment of this patient's clinical condition and my plan of care is as follows.      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 Successful PCI of prox-mid LAD with two overlapping drug eluting stent. There was jailed D1 that was unable to wire even after multiple attempts, It had TIMI 2 flow and was  improved. Moderate disease of RCA not significant by IFR    Cont Asa   Started plavix   Started metoprolol    Statin. Check lipid panels   ECHO pending       Hypertension   History of DVT after prolonged trip in 2020, treated with anticoagulation   GERD   Essential tremor   BPH   Resume home meds       Code Status: full    Surrogate Decision Maker: wife    DVT Prophylaxis: lovenox    GI Prophylaxis: not indicated   Baseline: independent    EDD: 9/18, pending ECHO and cardiac clearance            Subjective:     CHIEF COMPLAINT: CP      HISTORY OF PRESENT ILLNESS:       John Blevins is a 87 y.o.  Caucasian male who presents with the above CC.   Pt presented with substernal chest pain intermittently for last 2 weeks.  Since yesterday it was more worse, he felt it could be acid reflux from eating Cheetos, he tried Pepcid and felt some relief.  He continued to have chest pain, so he came to the  hospital.  He denied any shortness of breath, nausea, vomiting.  pain radiates to b/l shoulders.     He goes to [facility] 3-5 days a week, rides bike, and lifts weight.  He walked half a mile yesterday without any significant symptoms.  He had 8 out of 10 chest pain, that improved to 2 out of 10.  His troponin was elevated at 11,000.  His EKG has  ST-T abnormality.      XR CHEST PORT  Result Date: 10/07/2020   Shallow inspiration, no infiltrate.     We were asked to admit for work up and evaluation of the above problems.       No past medical history on file.       No past surgical history on file.        Social History          Tobacco Use         ?  Smoking status:  Not on file     ?  Smokeless tobacco:  Not on file       Substance Use Topics         ?  Alcohol use:  Not on file            FHx:  No FH of premature CAD       No Known Allergies         Prior to Admission medications        Not on File           REVIEW OF SYSTEMS:      Total of 12 systems reviewed, negative except for the above.      I am not able to complete the review of systems because:        The patient is intubated and sedated       The patient has altered mental status due to his acute medical problems       The patient has baseline aphasia from prior stroke(s)       The patient has baseline dementia and is not reliable historian          The patient is in acute medical distress and unable to provide information                      POSITIVE= underlined text  Negative = text not underlined   General:  fever, chills, sweats, generalized weakness, weight loss/gain,       loss of appetite    Eyes:    blurred vision, eye pain,  loss of vision, double vision   ENT:    rhinorrhea, pharyngitis    Respiratory:   cough, sputum production, SOB, DOE, wheezing, pleuritic pain    Cardiology:   chest pain, palpitations,  orthopnea, PND, edema, syncope    Gastrointestinal:  abdominal pain , N/V, diarrhea, dysphagia, constipation, bleeding    Genitourinary:  frequency, urgency, dysuria, hematuria, incontinence    Muskuloskeletal :  arthralgia, myalgia, back pain   Hematology:  easy bruising, nose or gum bleeding, lymphadenopathy    Dermatological: rash, ulceration, pruritis, color change / jaundice   Endocrine:   hot flashes or polydipsia    Neurological:  headache, dizziness, confusion, focal weakness, paresthesia,      Speech difficulties, memory loss, gait difficulty   Psychological: Feelings of anxiety, depression, agitation        Objective:     VITALS:     Visit Vitals      BP  131/74     Pulse  69     Temp  98.1 ??F (36.7 ??C)     Resp  19     Ht  '5\' 10"'$  (1.778 m)     Wt  86.2 kg (190 lb)     SpO2  98%        BMI  27.26 kg/m??        No intake or output data in the 24  hours ending 10/07/20 1007      Wt Readings from Last 10 Encounters:        10/07/20  86.2 kg (190 lb)           PHYSICAL EXAM:      General:    Alert, cooperative, no distress, appears stated age.      HEENT: Atraumatic, anicteric sclerae, pink conjunctivae, MMM   Neck:  Supple, symmetrical   Lungs:   CTA. No Wheezing/Rhonchi. No rales. No tenderness  No Accessory muscle use.   CVS:   Regular rhythm. No murmur. No JVD    GI/GU:   Soft, NT. ND.  BS normal   Extremities: No edema. No cyanosis. No clubbing.    Skin:     Not pale. Not Jaundiced. No rashes    Psych:  Good insight. Not depressed. Not anxious or agitated.   Neurologic: Alert and oriented X 4. EOMs intact. No facial asymmetry. No slurred speech. Symmetrical strength, Sensation grossly intact.       _______________________________________________________________________   Care Plan discussed with:           Comments          Patient  x           Family   x           RN  x       Care Manager                            Consultant:          _______________________________________________________________________   Expected  Disposition:       Home with Family  x        HH/PT/OT/RN       SNF/LTC          SAHR       ________________________________________________________________________   TOTAL TIME:  93 Minutes      Critical Care Provided     Minutes non procedure based              Comments           x  Reviewed previous records         >50% of visit spent in counseling and coordination of care  x  Discussion with patient and/or family and questions answered           ________________________________________________________________________   Signed: Joanna Hews, MD      Procedures: see electronic medical records for all procedures/Xrays and details which were not copied into this note but were reviewed prior to creation of Plan.      LAB DATA REVIEWED:       Recent Results (from the past 24 hour(s))     EKG, 12 LEAD, INITIAL          Collection Time: 10/07/20  5:27 AM         Result  Value  Ref Range            Ventricular Rate  66  BPM       Atrial Rate  66  BPM       P-R Interval  152  ms       QRS Duration  112  ms       Q-T Interval  436  ms       QTC Calculation (Bezet)  457  ms       Calculated  R Axis  -29  degrees       Calculated T Axis  134  degrees       Diagnosis                 Normal sinus rhythm   Incomplete right bundle branch block   Septal infarct , age undetermined   T wave abnormality, consider inferior ischemia   T wave abnormality, consider anterolateral ischemia   No previous ECGs available          CBC WITH AUTOMATED DIFF          Collection Time: 10/07/20  5:32 AM         Result  Value  Ref Range            WBC  9.7  4.1 - 11.1 K/uL       RBC  4.48  4.10 - 5.70 M/uL       HGB  13.4  12.1 - 17.0 g/dL       HCT  38.4  36.6 - 50.3 %       MCV  85.7  80.0 - 99.0 FL       MCH  29.9  26.0 - 34.0 PG       MCHC   34.9  30.0 - 36.5 g/dL       RDW  12.7  11.5 - 14.5 %       PLATELET  200  150 - 400 K/uL       MPV  9.4  8.9 - 12.9 FL       NRBC  0.0  0 PER 100 WBC       ABSOLUTE NRBC  0.00  0.00 - 0.01 K/uL       NEUTROPHILS  65  32 - 75 %       LYMPHOCYTES  25  12 - 49 %       MONOCYTES  8  5 - 13 %       EOSINOPHILS  2  0 - 7 %       BASOPHILS  0  0 - 1 %       IMMATURE GRANULOCYTES  0  0.0 - 0.5 %       ABS. NEUTROPHILS  6.3  1.8 - 8.0 K/UL       ABS. LYMPHOCYTES  2.4  0.8 - 3.5 K/UL       ABS. MONOCYTES  0.8  0.0 - 1.0 K/UL       ABS. EOSINOPHILS  0.2  0.0 - 0.4 K/UL       ABS. BASOPHILS  0.0  0.0 - 0.1 K/UL       ABS. IMM. GRANS.  0.0  0.00 - 0.04 K/UL       DF  AUTOMATED          METABOLIC PANEL, COMPREHENSIVE          Collection Time: 10/07/20  5:32 AM         Result  Value  Ref Range            Sodium  138  136 - 145 mmol/L       Potassium  4.4  3.5 - 5.1 mmol/L       Chloride  100  97 - 108 mmol/L       CO2  32  21 - 32 mmol/L       Anion gap  6  5 - 15 mmol/L  Glucose  142 (H)  65 - 100 mg/dL       BUN  18  6 - 20 MG/DL       Creatinine  1.24  0.70 - 1.30 MG/DL       BUN/Creatinine ratio  15  12 - 20         GFR est AA  >60  >60 ml/min/1.13m       GFR est non-AA  55 (L)  >60 ml/min/1.787m      Calcium  9.7  8.5 - 10.1 MG/DL       Bilirubin, total  0.5  0.2 - 1.0 MG/DL       ALT (SGPT)  26  12 - 78 U/L       AST (SGOT)  95 (H)  15 - 37 U/L       Alk. phosphatase  92  45 - 117 U/L       Protein, total  6.7  6.4 - 8.2 g/dL       Albumin  3.9  3.5 - 5.0 g/dL       Globulin  2.8  2.0 - 4.0 g/dL       A-G Ratio  1.4  1.1 - 2.2         NT-PRO BNP          Collection Time: 10/07/20  5:32 AM         Result  Value  Ref Range            NT pro-BNP  4,383 (H)  <450 PG/ML       TROPONIN-HIGH SENSITIVITY          Collection Time: 10/07/20  5:32 AM         Result  Value  Ref Range            Troponin-High Sensitivity  11,876 (HH)  0 - 76 ng/L       D DIMER          Collection Time: 10/07/20  6:29 AM         Result  Value  Ref  Range            D-dimer  0.24  0.00 - 0.65 mg/L FEU       PTT          Collection Time: 10/07/20  6:29 AM         Result  Value  Ref Range            aPTT  26.6  22.1 - 31.0 sec            aPTT, therapeutic range       58.0 - 77.0 SECS        XR CHEST PORT      Result Date: 10/07/2020   Shallow inspiration, no infiltrate.              Current Medications:       Current Facility-Administered Medications:    ?  aspirin chewable tablet 81 mg, 81 mg, Oral, DAILY, Zanin, Tanja Z, MD, 243 mg at 10/07/20 [id number] ?  heparin 25,000 units in D5W 250 ml infusion, 12-25 Units/kg/hr, IntraVENous, TITRATE, BoLorenda CahillMD, Last Rate: 10.3 mL/hr at 10/07/20  0807, 12 Units/kg/hr at 10/07/20 0807   ?  heparin (porcine) 1,000 unit/mL injection 2,000 Units, 2,000 Units, IntraVENous, PRN **OR** heparin (porcine) 1,000 unit/mL injection 4,000  Units, 4,000 Units, IntraVENous, PRN, BoLorenda Cahill  MD   ?  nitroglycerin (NITROBID) 2 % ointment 1 Inch, 1 Inch, Topical, Q6H, Gildardo Griffes, MD, 1 Inch at 10/07/20 0919   ?  metoprolol tartrate (LOPRESSOR) tablet 12.5 mg, 12.5 mg, Oral, Q12H, Gildardo Griffes, MD   ?  atorvastatin (LIPITOR) tablet 20 mg, 20 mg, Oral, QHS, Gildardo Griffes, MD

## 2020-10-07 NOTE — ED Notes (Signed)
Called pharmacy to confirm heparin bolus and starting dose. Confirmed bolus as 4,000 units and starting dose at 12 units/kg/hr

## 2020-10-07 NOTE — Consults (Signed)
Consults  by Nona Dell, MD at 10/07/20 1134                Author: Nona Dell, MD  Service: Cardiology  Author Type: Physician       Filed: 10/07/20 1810  Date of Service: 10/07/20 1134  Status: Addendum          Editor: Nona Dell, MD (Physician)          Related Notes: Original Note by Nona Dell, MD (Physician) filed at 10/07/20 [phone]            Consult Orders        1. IP CONSULT TO CARDIOLOGY [[id number]] ordered by Chapman Fitch, DO at 10/07/20 [phone]                                          IP Cardiology Consult          Date of consult:  10/07/20   Date of admission: 10/07/2020   Primary Cardiologist: na   Physician Requesting consult: Dr. Senaida Ores           Assessment:      Problem list:    1.  Non-ST elevation myocardial infarction   2.  Hypertension   3.  History of DVT after prolonged trip in 2020, treated with anticoagulation   4.  GERD   5.  Essential tremor   6.  BPH      Lives with wife, considering age he is fairly active             Recommendations:      1.  Continue aspirin and heparin drip   2.  Given sublingual nitro, will start Nitropaste for chest pain    3.  Trend troponin   4.  Start Lipitor 40 mg daily, chest lipid panels    5.  Add metoprolol   6.  Check 2d echo   7.  Discussed about risk and benefit of left heart catheterization, if continue to have chest pain, then sooner      Thank you for this consult and allowing me to take part in this patients care.  Please call with questions.            [x]         High complexity decision making was performed         CC / Reason for consult: NSTEMI   History of the presenting illness:   John Blevins is a 87 y.o. male with past medical history of hypertension, CKD, hypertension presented with chest pain.  He reports having substernal chest pain intermittently for last 2 weeks.  Since  yesterday it was more worse, he felt it could be acid reflux, he tried Pepcid and felt some relief.  He continued to  have chest pain, so he came to the hospital.  He denied any shortness of breath, nausea, vomiting.  Considering his age he is fairly active.   He goes to [employer] bike lifts weight.  He walked half a mile yesterday without any significant symptoms.    He had 8 out of 10 chest pain, that improved to 2 out of 10.  His troponin was elevated at 11,000.  His EKG has ST-T abnormality, Q  wave involving V1,V2, TWI in anterolateral leads.      PMH      HTN  DVT   Tremors    BPH          Family history:    No FH of premature CAD         Social History          Socioeconomic History         ?  Marital status:  MARRIED              Spouse name:  Not on file         ?  Number of children:  Not on file     ?  Years of education:  Not on file     ?  Highest education level:  Not on file       Occupational History        ?  Not on file       Tobacco Use         ?  Smoking status:  Not on file     ?  Smokeless tobacco:  Not on file       Substance and Sexual Activity         ?  Alcohol use:  Not on file     ?  Drug use:  Not on file     ?  Sexual activity:  Not on file        Other Topics  Concern        ?  Not on file       Social History Narrative        ?  Not on file          Social Determinants of Health          Financial Resource Strain: Not on file     Food Insecurity: Not on file     Transportation Needs: Not on file     Physical Activity: Not on file     Stress: Not on file     Social Connections: Not on file     Intimate Partner Violence: Not on file       Housing Stability: Not on file              ROS       Total of 12 systems reviewed, all systems review was negative except Pertinent Positives included in HPI       Visit Vitals      BP  (!) 160/74     Pulse  61     Temp  98.1 ??F (36.7 ??C)     Resp  19     Ht  5\' 10"  (1.778 m)     Wt  86.2 kg (190 lb)     SpO2  98%        BMI  27.26 kg/m??           Physical Exam   Examination:       General: Alert + Oriented x3, no acute distress    HEENT: Normocephalic aromatic,  MMM    Neck: Supple, JVP- not well appreciated    RS: Non labored, clear    CVS: Regular rate and rhythm, S1S2, no murmur    Abd: Soft, non tender, non distended    Lower extremity: Warm to touch, Edema- None    Skin: Warm and dry, No significant bruises or rash    CNS: Oriented x3, no focal neuro deficit       Lab review:   BMP:  Lab Results         Component  Value  Date/Time            NA  138  10/07/2020 05:32 AM       K  4.4  10/07/2020 05:32 AM       CL  100  10/07/2020 05:32 AM       CO2  32  10/07/2020 05:32 AM       AGAP  6  10/07/2020 05:32 AM       GLU  142 (H)  10/07/2020 05:32 AM       BUN  18  10/07/2020 05:32 AM       CREA  1.24  10/07/2020 05:32 AM       GFRAA  >60  10/07/2020 05:32 AM            GFRNA  55 (L)  10/07/2020 05:32 AM            CBC:     Lab Results         Component  Value  Date/Time            WBC  9.7  10/07/2020 05:32 AM       HGB  13.4  10/07/2020 05:32 AM       HCT  38.4  10/07/2020 05:32 AM       PLATELET  200  10/07/2020 05:32 AM            MCV  85.7  10/07/2020 05:32 AM           All Cardiac Markers in the last 24 hours:       Lab Results         Component  Value  Date/Time            BNPNT  4,383 (H)  10/07/2020 05:32 AM           Data review:      Tele:   Nsr      EKG tracing personally reviewed:    NSR, PRWP, Q wave V1-V2, TWI anterolateral leads       Echocardiogram:      Other cardiac testing:      Other imaging:            Signed:   Izell Carolina MD   Interventional Cardiology   10/07/2020

## 2020-10-07 NOTE — Progress Notes (Signed)
Progress Notes by Juanell Fairly, NP at 10/07/20 2335                Author: Juanell Fairly, NP  Service: Hospitalist  Author Type: Nurse Practitioner       Filed: 10/07/20 2335  Date of Service: 10/07/20 2335  Status: Signed          Editor: Juanell Fairly, NP (Nurse Practitioner)                    Nocturnist NP Note             [phone]- Notified by nursing the patient is restless, unable to sleep, impulsive, try to get out of bed without assistance.  Nursing requesting order for medication to help him sleep.      Ordered as needed melatonin.  May use sitter if needed.                   Please note that this note was dictated using Training and development officer.  Quite often unanticipated grammatical, syntax, homophones, and  other interpretive errors are inadvertently transcribed by the computer software.  Please disregard these errors.  Please excuse any errors that have escaped final proofreading.

## 2020-10-07 NOTE — Progress Notes (Signed)
Brief Procedure Note:         Indication:   NSTEMI    Procedure:   LHC, Cors   PCI of LAD with DES x2   IFR of RCA    Complications: None   Blood loss: Minimal   Condition: Stable     Brief procedure Result:   Subtotally occluded LAD with collateral from RCA   Prox to mid LAD severely disease with calcification and mid to distal LAD small caliber vessel.  Successful PCI of prox-mid LAD with two overlapping drug eluting stent   There was jailed D1 that was unable to wire even after multiple attempts, It had TIMI 2 flow and was improved , patient reported chest pain was resolved   Moderate disease of RCA not significant by IFR     Recommendations:   Continue aspirin and plavix   Cont statin   Metoprolol and imdur   Echocardiogram       Full note and recommendations to follow. '

## 2020-10-08 DIAGNOSIS — I2510 Atherosclerotic heart disease of native coronary artery without angina pectoris: Secondary | ICD-10-CM | POA: Insufficient documentation

## 2020-10-08 LAB — EKG, 12 LEAD, INITIAL
Atrial Rate: 66 {beats}/min
Calculated R Axis: -29 degrees
Calculated T Axis: 134 degrees
Diagnosis: NORMAL
P-R Interval: 152 ms
Q-T Interval: 436 ms
QRS Duration: 112 ms
QTC Calculation (Bezet): 457 ms
Ventricular Rate: 66 {beats}/min

## 2020-10-08 LAB — METABOLIC PANEL, BASIC
Anion gap: 7 mmol/L (ref 5–15)
Anion gap: 8 mmol/L (ref 5–15)
BUN/Creatinine ratio: 15 (ref 12–20)
BUN/Creatinine ratio: 16 (ref 12–20)
BUN: 24 MG/DL — ABNORMAL HIGH (ref 6–20)
BUN: 29 MG/DL — ABNORMAL HIGH (ref 6–20)
CO2: 26 mmol/L (ref 21–32)
CO2: 27 mmol/L (ref 21–32)
Calcium: 8.5 MG/DL (ref 8.5–10.1)
Calcium: 8.3 MG/DL — ABNORMAL LOW (ref 8.5–10.1)
Chloride: 101 mmol/L (ref 97–108)
Chloride: 100 mmol/L (ref 97–108)
Creatinine: 1.58 MG/DL — ABNORMAL HIGH (ref 0.70–1.30)
Creatinine: 1.77 MG/DL — ABNORMAL HIGH (ref 0.70–1.30)
GFR est AA: 50 mL/min/{1.73_m2} — ABNORMAL LOW (ref >60)
GFR est AA: 44 mL/min/{1.73_m2} — ABNORMAL LOW (ref >60)
GFR est non-AA: 41 mL/min/{1.73_m2} — ABNORMAL LOW (ref >60)
GFR est non-AA: 36 mL/min/{1.73_m2} — ABNORMAL LOW (ref >60)
Glucose: 172 mg/dL — ABNORMAL HIGH (ref 65–100)
Glucose: 126 mg/dL — ABNORMAL HIGH (ref 65–100)
Potassium: 3.7 mmol/L (ref 3.5–5.1)
Potassium: 4.3 mmol/L (ref 3.5–5.1)
Sodium: 134 mmol/L — ABNORMAL LOW (ref 136–145)
Sodium: 135 mmol/L — ABNORMAL LOW (ref 136–145)

## 2020-10-08 LAB — LIPID PANEL
CHOL/HDL Ratio: 3.2 (ref 0.0–5.0)
Chol/HDL Ratio: 3.2 (ref 0.0–5.0)
Cholesterol, Total: 169 MG/DL (ref <200)
Cholesterol, total: 169 MG/DL (ref <200)
HDL Cholesterol: 53 MG/DL
HDL: 53 MG/DL
LDL Calculated: 102 MG/DL — ABNORMAL HIGH (ref 0–100)
LDL, calculated: 102 MG/DL — ABNORMAL HIGH (ref 0–100)
Triglyceride: 70 MG/DL (ref <150)
Triglycerides: 70 MG/DL (ref <150)
VLDL Cholesterol Calculated: 14 MG/DL
VLDL, calculated: 14 MG/DL

## 2020-10-08 LAB — CBC W/O DIFF
ABSOLUTE NRBC: 0 10*3/uL (ref 0.00–0.01)
HCT: 32.8 % — ABNORMAL LOW (ref 36.6–50.3)
HGB: 11.6 g/dL — ABNORMAL LOW (ref 12.1–17.0)
MCH: 30.4 PG (ref 26.0–34.0)
MCHC: 35.4 g/dL (ref 30.0–36.5)
MCV: 86.1 FL (ref 80.0–99.0)
MPV: 9.7 FL (ref 8.9–12.9)
NRBC: 0 PER 100 WBC
PLATELET: 180 10*3/uL (ref 150–400)
RBC: 3.81 M/uL — ABNORMAL LOW (ref 4.10–5.70)
RDW: 13 % (ref 11.5–14.5)
WBC: 11.4 10*3/uL — ABNORMAL HIGH (ref 4.1–11.1)

## 2020-10-08 LAB — EKG, 12 LEAD, SUBSEQUENT
Atrial Rate: 72 {beats}/min
Calculated P Axis: 46 degrees
Calculated R Axis: -28 degrees
Calculated T Axis: 125 degrees
P-R Interval: 168 ms
Q-T Interval: 442 ms
QRS Duration: 108 ms
QTC Calculation (Bezet): 483 ms
Ventricular Rate: 72 {beats}/min

## 2020-10-08 LAB — MAGNESIUM
Magnesium: 1.9 mg/dL (ref 1.6–2.4)
Magnesium: 1.9 mg/dL (ref 1.6–2.4)

## 2020-10-08 LAB — CBC
Hematocrit: 32.8 % — ABNORMAL LOW (ref 36.6–50.3)
Hemoglobin: 11.6 g/dL — ABNORMAL LOW (ref 12.1–17.0)
MCH: 30.4 PG (ref 26.0–34.0)
MCHC: 35.4 g/dL (ref 30.0–36.5)
MCV: 86.1 FL (ref 80.0–99.0)
MPV: 9.7 FL (ref 8.9–12.9)
NRBC Absolute: 0 10*3/uL (ref 0.00–0.01)
Nucleated RBCs: 0 PER 100 WBC
Platelets: 180 10*3/uL (ref 150–400)
RBC: 3.81 M/uL — ABNORMAL LOW (ref 4.10–5.70)
RDW: 13 % (ref 11.5–14.5)
WBC: 11.4 10*3/uL — ABNORMAL HIGH (ref 4.1–11.1)

## 2020-10-08 LAB — BASIC METABOLIC PANEL
Anion Gap: 7 mmol/L (ref 5–15)
Anion Gap: 8 mmol/L (ref 5–15)
BUN: 24 MG/DL — ABNORMAL HIGH (ref 6–20)
BUN: 29 MG/DL — ABNORMAL HIGH (ref 6–20)
Bun/Cre Ratio: 15 (ref 12–20)
Bun/Cre Ratio: 16 (ref 12–20)
CO2: 26 mmol/L (ref 21–32)
CO2: 27 mmol/L (ref 21–32)
Calcium: 8.5 MG/DL (ref 8.5–10.1)
Calcium: 8.3 MG/DL — ABNORMAL LOW (ref 8.5–10.1)
Chloride: 101 mmol/L (ref 97–108)
Chloride: 100 mmol/L (ref 97–108)
Creatinine: 1.58 MG/DL — ABNORMAL HIGH (ref 0.70–1.30)
Creatinine: 1.77 MG/DL — ABNORMAL HIGH (ref 0.70–1.30)
EGFR IF NonAfrican American: 41 mL/min/{1.73_m2} — ABNORMAL LOW (ref >60)
EGFR IF NonAfrican American: 36 mL/min/{1.73_m2} — ABNORMAL LOW (ref >60)
GFR African American: 50 mL/min/{1.73_m2} — ABNORMAL LOW (ref >60)
GFR African American: 44 mL/min/{1.73_m2} — ABNORMAL LOW (ref >60)
Glucose: 172 mg/dL — ABNORMAL HIGH (ref 65–100)
Glucose: 126 mg/dL — ABNORMAL HIGH (ref 65–100)
Potassium: 3.7 mmol/L (ref 3.5–5.1)
Potassium: 4.3 mmol/L (ref 3.5–5.1)
Sodium: 134 mmol/L — ABNORMAL LOW (ref 136–145)
Sodium: 135 mmol/L — ABNORMAL LOW (ref 136–145)

## 2020-10-08 LAB — EKG 12-LEAD
Atrial Rate: 66 {beats}/min
Atrial Rate: 72 {beats}/min
Diagnosis: NORMAL
P Axis: 46 degrees
P-R Interval: 152 ms
P-R Interval: 168 ms
Q-T Interval: 436 ms
Q-T Interval: 442 ms
QRS Duration: 108 ms
QRS Duration: 112 ms
QTc Calculation (Bazett): 457 ms
QTc Calculation (Bazett): 483 ms
R Axis: -28 degrees
R Axis: -29 degrees
T Axis: 125 degrees
T Axis: 134 degrees
Ventricular Rate: 66 {beats}/min
Ventricular Rate: 72 {beats}/min

## 2020-10-08 MED ORDER — FUROSEMIDE 20 MG TAB
20 mg | Freq: Every day | ORAL | Status: DC
Start: 2020-10-08 — End: 2020-10-08

## 2020-10-08 MED ORDER — ATORVASTATIN 40 MG TAB
40 mg | Freq: Every evening | ORAL | Status: AC
Start: 2020-10-08 — End: 2020-10-09
  Administered 2020-10-09 (×2): via ORAL

## 2020-10-08 MED ORDER — ENOXAPARIN 30 MG/0.3 ML SUB-Q SYRINGE
30 mg/0.3 mL | SUBCUTANEOUS | Status: AC
Start: 2020-10-08 — End: 2020-10-09
  Administered 2020-10-08 – 2020-10-09 (×2): via SUBCUTANEOUS

## 2020-10-08 MED ORDER — FUROSEMIDE 20 MG TAB
20 mg | Freq: Once | ORAL | Status: AC
Start: 2020-10-08 — End: 2020-10-08
  Administered 2020-10-08: 14:00:00 via ORAL

## 2020-10-08 MED ORDER — MELATONIN 3 MG TAB
3 mg | Freq: Every evening | ORAL | Status: AC | PRN
Start: 2020-10-08 — End: 2020-10-09
  Administered 2020-10-08 – 2020-10-09 (×2): via ORAL

## 2020-10-08 MED ORDER — SODIUM CHLORIDE 0.9 % IV
INTRAVENOUS | Status: DC
Start: 2020-10-08 — End: 2020-10-08
  Administered 2020-10-08: 13:00:00 via INTRAVENOUS

## 2020-10-08 MED ORDER — METOPROLOL SUCCINATE SR 25 MG 24 HR TAB
25 mg | Freq: Every day | ORAL | Status: AC
Start: 2020-10-08 — End: 2020-10-09
  Administered 2020-10-09: 13:00:00 via ORAL

## 2020-10-08 MED FILL — FUROSEMIDE 20 MG TAB: 20 mg | ORAL | Qty: 1

## 2020-10-08 MED FILL — METOPROLOL TARTRATE 25 MG TAB: 25 mg | ORAL | Qty: 1

## 2020-10-08 MED FILL — MAPAP (ACETAMINOPHEN) 325 MG TABLET: 325 mg | ORAL | Qty: 2

## 2020-10-08 MED FILL — MELATONIN 3 MG TAB: 3 mg | ORAL | Qty: 1

## 2020-10-08 MED FILL — SODIUM CHLORIDE 0.9 % IV: INTRAVENOUS | Qty: 1000

## 2020-10-08 MED FILL — ENOXAPARIN 30 MG/0.3 ML SUB-Q SYRINGE: 30 mg/0.3 mL | SUBCUTANEOUS | Qty: 0.3

## 2020-10-08 MED FILL — ISOSORBIDE MONONITRATE SR 30 MG 24 HR TAB: 30 mg | ORAL | Qty: 1

## 2020-10-08 MED FILL — ASPIRIN 81 MG CHEWABLE TAB: 81 mg | ORAL | Qty: 1

## 2020-10-08 MED FILL — LIPITOR 20 MG TABLET: 20 mg | ORAL | Qty: 1

## 2020-10-08 MED FILL — OXYCODONE 5 MG TAB: 5 mg | ORAL | Qty: 1

## 2020-10-08 MED FILL — CLOPIDOGREL 75 MG TAB: 75 mg | ORAL | Qty: 1

## 2020-10-08 NOTE — Progress Notes (Signed)
Bedside shift change report given to Waree Panyason, RN (oncoming nurse) by Terry Cruggs, RN (offgoing nurse). Report included the following information SBAR, Kardex, Procedure Summary, Intake/Output, MAR, Accordion, Recent Results, Med Rec Status, Cardiac Rhythm NSR, and Alarm Parameters .

## 2020-10-08 NOTE — Progress Notes (Signed)
Progress Notes by Minette Brine, MD at 10/08/20 1207                Author: Minette Brine, MD  Service: Internal Medicine  Author Type: Physician       Filed: 10/08/20 1219  Date of Service: 10/08/20 1207  Status: Signed          Editor: Minette Brine, MD (Physician)                       Hospitalist Progress Note      NAME: Quindon Geckler    DOB:  04-27-1928    MRN:  [id number]          Assessment / Plan:     NSTEMI   S/p Cardiac cath, Subtotally occluded LAD with collateral from RCA    Prox to mid LAD severely disease with calcification and small caliber vessel. Successful PCI of prox-mid LAD with two overlapping drug eluting stent. There was jailed D1 that was unable to wire even  after multiple attempts, It had TIMI 2 flow and was improved. Moderate disease of RCA not significant by IFR       Cont Asa, Plavix and statin   C/w  metoprolol    LDL 102   ECHO EF 35%       Acute kidney injury   Creatinine trending up from 1.24 to 1.58   Gentle hydration      Agitation overnight most likely due to hospital delirium   For RN notes, patient was restless, trying to get out of bed, was not able to sleep   Was ordered melatonin   Hypertension   History of DVT after prolonged trip in 2020, treated with anticoagulation   GERD   Essential tremor   BPH   Resume home meds        Code Status: full    Surrogate Decision Maker: wife    DVT Prophylaxis: lovenox    GI Prophylaxis: not indicated   Baseline: independent    EDD:9/19   Barriers: Improvement of creatinine   Updated wife at bedside      25.0 - 29.9 Overweight /  Body mass index is 27.33 kg/m??.      Estimated discharge date: September 19   Barriers:      Code status: Full   Prophylaxis: Lovenox   Recommended Disposition: Home w/Family          Subjective:        Chief Complaint / Reason for Physician Visit   Follow-up NSTEMI, overnight event noted.  Patient had a brief episode of confusion and agitation.  When seen was sleeping, discussed with wife at bedside  discussed with RN events overnight.       Review of Systems:           Symptom  Y/N  Comments    Symptom  Y/N  Comments             Fever/Chills        Chest Pain                 Poor Appetite        Edema                 Cough        Abdominal Pain         Sputum        Joint Pain  SOB/DOE        Pruritis/Rash         Nausea/vomit        Tolerating PT/OT         Diarrhea        Tolerating Diet                 Constipation        Other               Could NOT obtain due to:            Objective:        VITALS:    Last 24hrs VS reviewed since prior progress note. Most recent are:   Patient Vitals for the past 24 hrs:            Temp  Pulse  Resp  BP  SpO2            10/08/20 1130  --  71  21  (!) 113/50  99 %            10/08/20 0800  98.9 ??F (37.2 ??C)  87  19  123/60  98 %     10/08/20 0218  98.5 ??F (36.9 ??C)  (!) 101  20  136/76  98 %     10/08/20 0216  --  (!) 113  --  136/76  --     10/07/20 2300  98.7 ??F (37.1 ??C)  (!) 104  18  (!) 114/54  93 %     10/07/20 2030  --  85  --  138/63  --     10/07/20 2000  --  79  --  131/66  95 %     10/07/20 1930  --  94  --  (!) 161/68  98 %     10/07/20 1900  98.7 ??F (37.1 ??C)  74  18  133/69  96 %     10/07/20 1845  --  79  --  131/67  96 %     10/07/20 1830  --  73  --  (!) 129/45  96 %     10/07/20 1815  --  76  --  (!) 142/67  96 %     10/07/20 1800  --  76  --  136/71  96 %     10/07/20 1745  --  76  (!) 31  --  97 %     10/07/20 1739  --  73  21  136/65  97 %     10/07/20 1730  --  81  24  (!) 145/67  96 %     10/07/20 1715  --  72  22  134/71  96 %     10/07/20 1700  --  70  26  (!) 141/67  96 %     10/07/20 1654  --  --  --  136/71  --     10/07/20 1645  --  66  24  (!) 191/160  97 %     10/07/20 1630  --  75  25  134/62  96 %     10/07/20 1615  --  69  22  136/71  96 %     10/07/20 1600  --  68  21  124/74  96 %     10/07/20 1545  --  68  25  (!) 149/65  96 %  10/07/20 1530  --  68  22  106/89  95 %     10/07/20 1515  --  62  30  135/69  95 %     10/07/20  1500  --  65  20  123/69  96 %     10/07/20 1445  --  62  23  137/68  97 %     10/07/20 1430  --  65  21  (!) 145/64  96 %            10/07/20 1415  97.3 ??F (36.3 ??C)  63  15  (!) 146/69  95 %           Intake/Output Summary (Last 24 hours) at 10/08/2020 1207   Last data filed at 10/08/2020 0438     Gross per 24 hour        Intake  1141.67 ml        Output  1100 ml        Net  41.67 ml            I had a face to face encounter and independently examined this patient on 10/08/2020, as outlined below:   PHYSICAL EXAM:   General: WD, WN. Alert, cooperative, no acute distress     EENT:  EOMI. Anicteric sclerae. MMM   Resp:  CTA bilaterally, no wheezing or rales.  No accessory muscle use   CV:  Regular  rhythm,  No edema   GI:  Soft, Non distended, Non tender.  +Bowel sounds   Neurologic:  Alert and oriented X 3, normal speech,    Psych:   Unable to assess. Not anxious nor agitated   Skin:  No rashes.  No jaundice      Reviewed most current lab test results and cultures  YES   Reviewed most current radiology test results   YES   Review and summation of old records today    NO   Reviewed patient's current orders and MAR    YES   PMH/SH reviewed - no change compared to H&P   ________________________________________________________________________   Care Plan discussed with:           Comments         Patient             Family   x  Wife at bedside         RN  x       Care Manager             Consultant                                  Multidiciplinary team rounds were held today with case manager, nursing, pharmacist and clinical coordinator.  Patient's plan of care was discussed; medications  were reviewed and discharge planning was addressed.       ________________________________________________________________________   Total NON critical care TIME:  25   Minutes      Total CRITICAL CARE TIME Spent:   Minutes non procedure based              Comments         >50% of visit spent in counseling and coordination of care          ________________________________________________________________________   Minette Brine, MD       Procedures: see electronic medical records for  all procedures/Xrays and details which were not copied into this note but were reviewed prior to creation of Plan.        LABS:   I reviewed today's most current labs and imaging studies.   Pertinent labs include:     Recent Labs            10/08/20   0220  10/07/20   0532     WBC  11.4*  9.7     HGB  11.6*  13.4     HCT  32.8*  38.4         PLT  180  200          Recent Labs            10/08/20   0220  10/07/20   0532     NA  134*  138     K  3.7  4.4     CL  101  100     CO2  26  32     GLU  172*  142*     BUN  24*  18     CREA  1.58*  1.24     CA  8.5  9.7     MG  1.9   --      ALB   --   3.9     TBILI   --   0.5         ALT   --   26           Signed: Minette Brine, MD

## 2020-10-08 NOTE — Progress Notes (Signed)
Progress Notes by Nona Dell, MD at 10/08/20 0930                Author: Nona Dell, MD  Service: Cardiology  Author Type: Physician       Filed: 10/08/20 0941  Date of Service: 10/08/20 0930  Status: Signed          Editor: Nona Dell, MD (Physician)                          Progress Note         10/08/2020 9:30 AM   NAME: John Blevins    MRN:  [id number]    Admit Diagnosis: NSTEMI (non-ST elevated myocardial infarction) (HCC) [I21.4]                   Assessment:       Problem list:    1.  Non-ST elevation myocardial infarction troponin 11K > 16K    - LHC showed Severe diffuse LAD calcific disease, occlusion in mid segment with collaterals distally from RCA, mid to distal small caliber vessel, severe  ostial diagonal disease, 70% Lcx disease after OM branch takes off - best treated with medical therapy, intermediate RCA disease     - Successful PCI of proximal to mid LAD with DES x2, Jailed diagonal branch unable to wire even after multiple attempts, there was TIMI 2 flow and it was improved, patient chest pain free; RCA disease was not significant by IFR       2.  Systolic heart failure with EF 35-40%, apical and distal anteroseptal hypokinesis   3.  Hypertension   4.  History of DVT after prolonged trip in 2020, treated with anticoagulation   5.  GERD   6.  Essential tremor   7.  BPH   8.  AKI over CKD, creatinine 1.25 > 1.5        Lives with wife, considering age he is fairly active; lives in [place] and was visiting                 Recommendations:      Patient remains chest pain free       1.  Continue aspirin  and plavix    2.  Continue lipitor    3.  Cont metoprolol and imdur    4.  If BP tolerates will add low dose losartan once renal function stable, currently will hold as renal function trending up, consider as OP    5.  Will discharge on low dose lasix after renal function stable       Repeat labs later today, Ambulate, if does well with ambulation and renal function  remains stable then discharge later today or tomorrow        Thank you for this consult and allowing me to take part in this patients care.  Please call with questions.              [x]          High complexity decision making was performed            Subjective:        HPI:       On low dose IV hydration, 50 cc/hr    Has crackles    Will give dose of lasix    Ambulate    Renal function slightly worse    Repeat tomorrow  Objective:         Physical Exam:      Last 24hrs VS reviewed since prior progress note. Most recent are:      Visit Vitals      BP  123/60     Pulse  87     Temp  98.9 ??F (37.2 ??C)     Resp  19     Ht  5\' 10"  (1.778 m)     Wt  86.4 kg (190 lb 8 oz)     SpO2  98%        BMI  27.33 kg/m??           Intake/Output Summary (Last 24 hours) at 10/08/2020 0930   Last data filed at 10/08/2020 0438     Gross per 24 hour        Intake  1141.67 ml        Output  1100 ml        Net  41.67 ml             General: Alert and oriented x3, no acute distress    Neck: Supple    Respiratory: No respiratory distress, crackles    Cardiovascular: Regular rate rhythm, S1S2, no murmur    Abdomen: soft, non tender, non distended    Neuro: moves all extremities, oriented x3    Skin: warm and dry    Extremity: no edema, warm to touch        Data Review      Telemetry: normal sinus rhythm           Lab Data Personally Reviewed:        Recent Labs            10/08/20   0220  10/07/20   0532     WBC  11.4*  9.7     HGB  11.6*  13.4     HCT  32.8*  38.4         PLT  180  200          Recent Labs           10/07/20   0629        APTT  26.6           Recent Labs            10/08/20   0220  10/07/20   0532     NA  134*  138     K  3.7  4.4     CL  101  100     CO2  26  32     BUN  24*  18     CREA  1.58*  1.24     GLU  172*  142*     CA  8.5  9.7         MG  1.9   --         No results for input(s): CPK, CKNDX, TROIQ in the last 72 hours.      No lab exists for component: CPKMB   No results found for: CHOL, CHOLX, CHLST, CHOLV,  HDL, HDLP, LDL, LDLC, DLDLP, TGLX, TRIGL, TRIGP, CHHD, CHHDX        Recent Labs           10/07/20   0532     AP  92     TP  6.7     ALB  3.9  GLOB  2.8        No results for input(s): PH, PCO2, PO2 in the last 72 hours.      Medications Personally Reviewed:        Current Facility-Administered Medications          Medication  Dose  Route  Frequency           ?  0.9% sodium chloride infusion   75 mL/hr  IntraVENous  CONTINUOUS           ?  [START ON 10/09/2020] metoprolol succinate (TOPROL-XL) XL tablet 25 mg   25 mg  Oral  DAILY           ?  furosemide (LASIX) tablet 20 mg   20 mg  Oral  ONCE     ?  atorvastatin (LIPITOR) tablet 40 mg   40 mg  Oral  QHS     ?  enoxaparin (LOVENOX) injection 30 mg   30 mg  SubCUTAneous  Q24H     ?  aspirin chewable tablet 81 mg   81 mg  Oral  DAILY     ?  sodium chloride (NS) flush 5-40 mL   5-40 mL  IntraVENous  Q8H     ?  sodium chloride (NS) flush 5-40 mL   5-40 mL  IntraVENous  PRN     ?  acetaminophen (TYLENOL) tablet 650 mg   650 mg  Oral  Q6H PRN          Or           ?  acetaminophen (TYLENOL) suppository 650 mg   650 mg  Rectal  Q6H PRN     ?  polyethylene glycol (MIRALAX) packet 17 g   17 g  Oral  DAILY PRN     ?  ondansetron (ZOFRAN ODT) tablet 4 mg   4 mg  Oral  Q8H PRN          Or           ?  ondansetron (ZOFRAN) injection 4 mg   4 mg  IntraVENous  Q6H PRN     ?  morphine injection 1 mg   1 mg  IntraVENous  Q4H PRN     ?  oxyCODONE IR (ROXICODONE) tablet 5 mg   5 mg  Oral  Q4H PRN     ?  clopidogreL (PLAVIX) tablet 75 mg   75 mg  Oral  DAILY     ?  isosorbide mononitrate ER (IMDUR) tablet 30 mg   30 mg  Oral  DAILY     ?  0.9% sodium chloride infusion   50 mL/hr  IntraVENous  CONTINUOUS     ?  nitroglycerin (NITROBID) 2 % ointment 1 Inch   1 Inch  Topical  Q6H PRN           ?  melatonin tablet 3 mg   3 mg  Oral  QHS PRN                    Nona Dell, MD

## 2020-10-08 NOTE — Progress Notes (Signed)
Transition of Care Plan:    RUR: 7%  Disposition: Home with follow-up appts  Follow up appointments: To be scheduled prior to d/c   DME needed: N/A  Transportation at Discharge: The patient's wife will be transporting him home upon d/c   Keys or means to access home: Yes       IM Medicare Letter: To be given prior to d/c   Is patient a Veteran and connected with the [place]?  N/A              If yes, was [place] transfer form completed and VA notified?   Caregiver Contact: Jamse Arn, Wife, Phone: [phone]  Discharge Caregiver contacted prior to discharge? CM spoke with the patient's wife at [place] needed?: No    Reason for Admission:  Chest Pain                  RUR Score:  7%                   Plan for utilizing home health:  Unknown at this time, currently there are no PT/OT consults. At baseline, the patient is completely independent in his ADLs and IADLs        PCP: First and Last name:  Ria Bush, MD   Name of Practice:    Are you a current patient: Yes/No: Yes   Approximate date of last visit: 2-3 months ago    Can you participate in a virtual visit with your PCP: Yes                    Current Advanced Directive/Advance Care Plan: Full Code    Healthcare Decision Maker: Jamse Arn, Wife, Phone: [phone]                  Transition of Care Plan:        CM met with the patient and his wife at bedside to discuss dispo plan. The patient's wife reported that they reside in [place], [place] and were in [place] for one of their great-grandchildren's birthdays when the patient became ill. The patient and his wife live in a 1 level home with 1 small step to enter in a retirement community. Most of the patient's wife's family lives in [place], [place] and most of the patient's family resides in [place], [place]. Their families are very supportive. At baseline, the patient is completely independent in his ADLs and IADLs. He uses no assistive device when ambulating. His wife reported  that the patient is very active and regularly goes to the [facility] to exercise. The patient is able to drive and his wife will transport him home upon d/c. The patient uses [facility] mail order pharmacy for his medications or he uses [facility]. He has never had [facility] services in the past and he has never been to a SNF/IPR facility in the past. CM will continue to assist with dispo needs.     Care Management Interventions  PCP Verified by CM: Yes (The patient saw his PCP 2-3 months ago in July)  Mode of Transport at Discharge: Other (see comment) (The patient's wife will be transporting him home upon d/c )  Transition of Care Consult (CM Consult): Discharge Planning  MyChart Signup: No  Physical Therapy Consult: No  Occupational Therapy Consult: No  Speech Therapy Consult: No  Support Systems: Spouse/Significant Other, Child(ren), Other Family Member(s)  Confirm Follow Up Transport: Family  The  Patient and/or Patient Representative was Provided with a Choice of Provider and Agrees with the Discharge Plan?: Yes  Name of the Patient Representative Who was Provided with a Choice of Provider and Agrees with the Discharge Plan: Jamse Arn (Wife)  [place] Provided?: No  Discharge Location  Patient Expects to be Discharged to:: Home     Hattie [place], [place]

## 2020-10-08 NOTE — Progress Notes (Signed)
Bedside shift change report given to Salomon Mast, RN (oncoming nurse) by Elpidio Galea, RN (offgoing nurse). Report included the following information SBAR, Kardex, Procedure Summary, Intake/Output, MAR, Accordion, Recent Results, Med Rec Status, Cardiac Rhythm NSR, and Alarm Parameters .

## 2020-10-09 ENCOUNTER — Encounter: Payer: Self-pay | Admitting: Family Medicine

## 2020-10-09 LAB — METABOLIC PANEL, BASIC
Anion gap: 11 mmol/L (ref 5–15)
BUN/Creatinine ratio: 19 (ref 12–20)
BUN: 25 MG/DL — ABNORMAL HIGH (ref 6–20)
CO2: 23 mmol/L (ref 21–32)
Calcium: 8.1 MG/DL — ABNORMAL LOW (ref 8.5–10.1)
Chloride: 98 mmol/L (ref 97–108)
Creatinine: 1.34 MG/DL — ABNORMAL HIGH (ref 0.70–1.30)
GFR est AA: >60 mL/min/{1.73_m2} (ref >60)
GFR est non-AA: 50 mL/min/{1.73_m2} — ABNORMAL LOW (ref >60)
Glucose: 139 mg/dL — ABNORMAL HIGH (ref 65–100)
Potassium: 3.8 mmol/L (ref 3.5–5.1)
Sodium: 132 mmol/L — ABNORMAL LOW (ref 136–145)

## 2020-10-09 LAB — BASIC METABOLIC PANEL
Anion Gap: 11 mmol/L (ref 5–15)
BUN: 25 MG/DL — ABNORMAL HIGH (ref 6–20)
Bun/Cre Ratio: 19 (ref 12–20)
CO2: 23 mmol/L (ref 21–32)
Calcium: 8.1 MG/DL — ABNORMAL LOW (ref 8.5–10.1)
Chloride: 98 mmol/L (ref 97–108)
Creatinine: 1.34 MG/DL — ABNORMAL HIGH (ref 0.70–1.30)
EGFR IF NonAfrican American: 50 mL/min/{1.73_m2} — ABNORMAL LOW (ref >60)
GFR African American: >60 mL/min/{1.73_m2} (ref >60)
Glucose: 139 mg/dL — ABNORMAL HIGH (ref 65–100)
Potassium: 3.8 mmol/L (ref 3.5–5.1)
Sodium: 132 mmol/L — ABNORMAL LOW (ref 136–145)

## 2020-10-09 MED ORDER — ATORVASTATIN 40 MG TAB
40 mg | ORAL_TABLET | Freq: Every evening | ORAL | 3 refills | Status: AC
Start: 2020-10-09 — End: ?

## 2020-10-09 MED ORDER — ENOXAPARIN 40 MG/0.4 ML SUB-Q SYRINGE
40 mg/0.4 mL | SUBCUTANEOUS | Status: DC
Start: 2020-10-09 — End: 2020-10-09

## 2020-10-09 MED ORDER — METOPROLOL SUCCINATE SR 25 MG 24 HR TAB
25 mg | ORAL_TABLET | Freq: Every day | ORAL | 3 refills | Status: DC
Start: 2020-10-09 — End: 2020-10-09

## 2020-10-09 MED ORDER — METOPROLOL SUCCINATE SR 50 MG 24 HR TAB
50 mg | Freq: Every day | ORAL | Status: DC
Start: 2020-10-09 — End: 2020-10-09

## 2020-10-09 MED ORDER — NITROGLYCERIN 0.4 MG SUBLINGUAL TAB
0.4 mg | ORAL_TABLET | SUBLINGUAL | 0 refills | Status: AC | PRN
Start: 2020-10-09 — End: ?

## 2020-10-09 MED ORDER — METOPROLOL TARTRATE 50 MG TAB
50 mg | ORAL_TABLET | Freq: Two times a day (BID) | ORAL | 3 refills | Status: AC
Start: 2020-10-09 — End: ?

## 2020-10-09 MED ORDER — ISOSORBIDE MONONITRATE SR 30 MG 24 HR TAB
30 mg | ORAL_TABLET | Freq: Every day | ORAL | 3 refills | Status: AC
Start: 2020-10-09 — End: ?

## 2020-10-09 MED ORDER — FUROSEMIDE 20 MG TAB
20 mg | ORAL | Status: DC
Start: 2020-10-09 — End: 2020-10-09
  Administered 2020-10-09: 11:00:00 via ORAL

## 2020-10-09 MED ORDER — CLOPIDOGREL 75 MG TAB
75 mg | ORAL_TABLET | Freq: Every day | ORAL | 3 refills | Status: AC
Start: 2020-10-09 — End: ?

## 2020-10-09 MED ORDER — LOSARTAN 25 MG TAB
25 mg | ORAL_TABLET | Freq: Every day | ORAL | 3 refills | Status: DC
Start: 2020-10-09 — End: 2020-10-09

## 2020-10-09 MED ORDER — METOPROLOL TARTRATE 25 MG TAB
25 mg | Freq: Two times a day (BID) | ORAL | Status: DC
Start: 2020-10-09 — End: 2020-10-09

## 2020-10-09 MED ORDER — FUROSEMIDE 20 MG TAB
20 mg | ORAL_TABLET | ORAL | 3 refills | Status: AC
Start: 2020-10-09 — End: ?

## 2020-10-09 MED FILL — MELATONIN 3 MG TAB: 3 mg | ORAL | Qty: 1

## 2020-10-09 MED FILL — FUROSEMIDE 20 MG TAB: 20 mg | ORAL | Qty: 1

## 2020-10-09 MED FILL — METOPROLOL SUCCINATE SR 25 MG 24 HR TAB: 25 mg | ORAL | Qty: 1

## 2020-10-09 MED FILL — ISOSORBIDE MONONITRATE SR 30 MG 24 HR TAB: 30 mg | ORAL | Qty: 1

## 2020-10-09 MED FILL — ENOXAPARIN 30 MG/0.3 ML SUB-Q SYRINGE: 30 mg/0.3 mL | SUBCUTANEOUS | Qty: 0.3

## 2020-10-09 MED FILL — CLOPIDOGREL 75 MG TAB: 75 mg | ORAL | Qty: 1

## 2020-10-09 MED FILL — ATORVASTATIN 40 MG TAB: 40 mg | ORAL | Qty: 1

## 2020-10-09 MED FILL — ASPIRIN 81 MG CHEWABLE TAB: 81 mg | ORAL | Qty: 1

## 2020-10-09 MED FILL — MAPAP (ACETAMINOPHEN) 325 MG TABLET: 325 mg | ORAL | Qty: 2

## 2020-10-09 NOTE — Progress Notes (Signed)
Progress Notes by Nona Dell, MD at 10/09/20 [phone]                Author: Nona Dell, MD  Service: Cardiology  Author Type: Physician       Filed: 10/09/20 1002  Date of Service: 10/09/20 0811  Status: Addendum          Editor: Nona Dell, MD (Physician)          Related Notes: Original Note by Nona Dell, MD (Physician) filed at 10/09/20 0814                          Progress Note         10/09/2020 9:30 AM   NAME: John Blevins    MRN:  [id number]    Admit Diagnosis: NSTEMI (non-ST elevated myocardial infarction) (HCC) [I21.4]                   Assessment:       Problem list:    1.  Non-ST elevation myocardial infarction troponin 11K > 16K    - LHC showed Severe diffuse LAD calcific disease, occlusion in mid segment with collaterals distally from RCA, mid to distal small caliber vessel, severe  ostial diagonal disease, 70% Lcx disease after OM branch takes off - best treated with medical therapy, intermediate RCA disease     - Successful PCI of proximal to mid LAD with DES x2, Jailed diagonal branch unable to wire even after multiple attempts, there was TIMI 2 flow and it was improved, patient chest pain free; RCA disease was not significant by IFR       2.  Systolic heart failure with EF 35-40%, apical and distal anteroseptal hypokinesis   3.  Hypertension   4.  History of DVT after prolonged trip in 2020, treated with anticoagulation   5.  GERD   6.  Essential tremor   7.  BPH   8.  AKI over CKD, creatinine 1.25 > 1.5 >1.7 > 1.3       Lives with wife, considering age he is fairly active; lives in [place] and was visiting [place]                Recommendations:      Patient remains chest pain free    Renal function improved and close to baseline       1.  Continue aspirin  and plavix    2.  Continue lipitor    3.  Cont metoprolol and imdur    4.  Lasix 20 mg po q48 hr    5.  Start low dose losartan    6.  Repeat blood work with PCP in 1-2 wks        Thank you for this consult and  allowing me to take part in this patients care.  Please call with questions.              [x]          High complexity decision making was performed            Subjective:        HPI:       Np cp   Reports some chest tightness when he was peeing    But no chest pain or tightness when ambulated         Objective:         Physical Exam:  Last 24hrs VS reviewed since prior progress note. Most recent are:      Visit Vitals      BP  (!) 162/68 (BP 1 Location: Left upper arm, BP Patient Position: Sitting)     Pulse  86     Temp  98.4 ??F (36.9 ??C)     Resp  16     Ht  5\' 10"  (1.778 m)     Wt  86.9 kg (191 lb 8 oz)     SpO2  100%        BMI  27.48 kg/m??           Intake/Output Summary (Last 24 hours) at 10/09/2020 0812   Last data filed at 10/09/2020 0438     Gross per 24 hour        Intake  1710 ml        Output  700 ml        Net  1010 ml             General: Alert and oriented x3, no acute distress    Neck: Supple    Respiratory: No respiratory distress, crackles    Cardiovascular: Regular rate rhythm, S1S2, no murmur    Abdomen: soft, non tender, non distended    Neuro: moves all extremities, oriented x3    Skin: warm and dry    Extremity: no edema, warm to touch        Data Review      Telemetry: normal sinus rhythm           Lab Data Personally Reviewed:        Recent Labs            10/08/20   0220  10/07/20   0532     WBC  11.4*  9.7     HGB  11.6*  13.4     HCT  32.8*  38.4         PLT  180  200          Recent Labs           10/07/20   0629        APTT  26.6           Recent Labs             10/09/20   0306  10/08/20   1404  10/08/20   0220     NA  132*  135*  134*     K  3.8  4.3  3.7     CL  98  100  101     CO2  23  27  26      BUN  25*  29*  24*     CREA  1.34*  1.77*  1.58*     GLU  139*  126*  172*     CA  8.1*  8.3*  8.5          MG   --    --   1.9        No results for input(s): CPK, CKNDX, TROIQ in the last 72 hours.      No lab exists for component: CPKMB     Lab Results         Component  Value   Date/Time            Cholesterol, total  169  10/08/2020 02:20 AM  HDL Cholesterol  53  10/08/2020 02:20 AM       LDL, calculated  102 (H)  00/17/4944 02:20 AM       Triglyceride  70  10/08/2020 02:20 AM            CHOL/HDL Ratio  3.2  10/08/2020 02:20 AM             Recent Labs           10/07/20   0532     AP  92     TP  6.7     ALB  3.9        GLOB  2.8        No results for input(s): PH, PCO2, PO2 in the last 72 hours.      Medications Personally Reviewed:        Current Facility-Administered Medications          Medication  Dose  Route  Frequency           ?  furosemide (LASIX) tablet 20 mg   20 mg  Oral  EVERY OTHER DAY     ?  metoprolol succinate (TOPROL-XL) XL tablet 25 mg   25 mg  Oral  DAILY     ?  atorvastatin (LIPITOR) tablet 40 mg   40 mg  Oral  QHS     ?  enoxaparin (LOVENOX) injection 30 mg   30 mg  SubCUTAneous  Q24H     ?  aspirin chewable tablet 81 mg   81 mg  Oral  DAILY     ?  sodium chloride (NS) flush 5-40 mL   5-40 mL  IntraVENous  Q8H     ?  sodium chloride (NS) flush 5-40 mL   5-40 mL  IntraVENous  PRN     ?  acetaminophen (TYLENOL) tablet 650 mg   650 mg  Oral  Q6H PRN          Or           ?  acetaminophen (TYLENOL) suppository 650 mg   650 mg  Rectal  Q6H PRN     ?  polyethylene glycol (MIRALAX) packet 17 g   17 g  Oral  DAILY PRN     ?  ondansetron (ZOFRAN ODT) tablet 4 mg   4 mg  Oral  Q8H PRN          Or           ?  ondansetron (ZOFRAN) injection 4 mg   4 mg  IntraVENous  Q6H PRN     ?  clopidogreL (PLAVIX) tablet 75 mg   75 mg  Oral  DAILY     ?  isosorbide mononitrate ER (IMDUR) tablet 30 mg   30 mg  Oral  DAILY     ?  nitroglycerin (NITROBID) 2 % ointment 1 Inch   1 Inch  Topical  Q6H PRN           ?  melatonin tablet 3 mg   3 mg  Oral  QHS PRN                    Nona Dell, MD

## 2020-10-09 NOTE — Progress Notes (Signed)
Patient is ambulating at baseline and vitals stable. Right radial  site clean, dry, intact.   Discharge and follow-up care given.  Site care instructions and medication changes reviewed.  Patient discharged home with wife as driver.

## 2020-10-09 NOTE — Progress Notes (Signed)
0905 Pt and I ambulated down the hall, pt tolerated the walk well. Pt stated there was some chest pressure but it was better than before he came in. Pt was sinus tach while walking and returned to baseline upon rest. Pt stated no SOB, chest pain, or lightheadedness.

## 2020-10-09 NOTE — Discharge Summary (Signed)
Discharge Summary by Minette Brine, MD at 10/09/20 1425                Author: Minette Brine, MD  Service: Internal Medicine  Author Type: Physician       Filed: 10/13/20 2019  Date of Service: 10/09/20 1425  Status: Signed          Editor: Minette Brine, MD (Physician)                                       Hospitalist Discharge Summary        Patient ID:   John Blevins   [id number]   87 y.o.   10/11/1928   10/07/2020      PCP on record: Eustaquio Boyden, MD      Admit date: 10/07/2020   Discharge date and time: 10/13/2020      DISCHARGE DIAGNOSIS:   Nstemi    S/p cardiac cath with PCI collateral from RCA    Aki    Agitation   Htn    Dvt          CONSULTATIONS:   IP CONSULT TO CARDIOLOGY      Excerpted HPI from H&P of Chapman Fitch, DO:   CHIEF COMPLAINT: CP       HISTORY OF PRESENT ILLNESS:      John Blevins is a 87 y.o.  Caucasian male who presents with the above CC.   Pt presented with substernal chest pain intermittently for last 2 weeks.  Since yesterday it was more worse, he felt it could be acid reflux from  eating Cheetos, he tried Pepcid and felt some relief.  He continued to have chest pain, so he came to the hospital.  He denied any shortness  of breath, nausea, vomiting.  pain radiates to b/l shoulders.     He goes to [facility] 3-5 days a week, rides bike, and lifts weight.   He walked half a mile yesterday without any significant symptoms.  He had 8 out of 10 chest pain, that improved to 2 out of 10.  His troponin was elevated at 11,000.  His EKG has ST-T abnormality.       XR CHEST PORT       Result Date: 10/07/2020   Shallow inspiration, no infiltrate.     We were asked to admit for work up and evaluation of the above problems.        No past medical history on file.        No past surgical history on file.          ______________________________________________________________________   DISCHARGE SUMMARY/HOSPITAL COURSE:   for full details see H&P, daily progress notes, labs, consult notes.        NSTEMI   S/p Cardiac cath, Subtotally occluded LAD with collateral from RCA    Prox to mid LAD severely disease with calcification and small caliber vessel. Successful PCI of prox-mid LAD with two overlapping drug eluting stent. There was jailed D1 that was unable to wire  even after multiple attempts, It had TIMI 2 flow and was improved. Moderate disease of RCA not significant by IFR        Cont Asa, Plavix and statin   C/w  metoprolol    LDL 102   ECHO EF 35%       Acute kidney injury  Creatinine trending up from 1.24 to 1.58, trended down with IVF    Gentle hydration       Agitation most likely due to hospital delirium   For RN notes, patient was restless, trying to get out of bed, was not able to sleep   Was ordered melatonin   Calm , alert oriented on discharge day    Hypertension   History of DVT after prolonged trip in 2020, treated with anticoagulation   GERD   Essential tremor   BPH   Resume home meds             _______________________________________________________________________   Patient seen and examined by me on discharge day.   Pertinent Findings:   Gen:    Not in distress   Chest: Clear lungs   CVS:   Regular rhythm.  No edema   Abd:  Soft, not distended, not tender   Neuro:  Alert, orientedx3   _______________________________________________________________________   DISCHARGE MEDICATIONS:      Discharge Medication List as of 10/09/2020  2:06 PM                 START taking these medications          Details        atorvastatin (LIPITOR) 40 mg tablet  Take 1 Tablet by mouth nightly., Normal, Disp-90 Tablet, R-3               clopidogreL (PLAVIX) 75 mg tab  Take 1 Tablet by mouth daily., Normal, Disp-90 Tablet, R-3               furosemide (LASIX) 20 mg tablet  Take 1 Tablet by mouth every other day., Normal, Disp-45 Tablet, R-3               isosorbide mononitrate ER (IMDUR) 30 mg tablet  Take 1 Tablet by mouth daily., Normal, Disp-90 Tablet, R-3               nitroglycerin (NITROSTAT) 0.4  mg SL tablet  1 Tablet by SubLINGual route every five (5) minutes as needed for Chest Pain for up to 3 doses. Up to 3 doses., Normal, Disp-30 Tablet, R-0                        CONTINUE these medications which have CHANGED          Details        metoprolol tartrate (LOPRESSOR) 50 mg tablet  Take 0.5 Tablets by mouth two (2) times a day., Normal, Disp-90 Tablet, R-3                        CONTINUE these medications which have NOT CHANGED          Details        losartan (COZAAR) 25 mg tablet  Take 25 mg by mouth daily., Historical Med               primidone (MYSOLINE) 50 mg tablet  Take 50 mg by mouth three (3) times daily., Historical Med               doxazosin (CARDURA) 4 mg tablet  Take 4 mg by mouth daily., Historical Med               famotidine (PEPCID) 20 mg tablet  Take 20 mg by mouth two (2) times a day., Historical Med  finasteride (PROSCAR) 5 mg tablet  Take 5 mg by mouth daily., Historical Med               aspirin 81 mg chewable tablet  Take 81 mg by mouth daily., Historical Med                        STOP taking these medications                  metoprolol succinate (TOPROL-XL) 25 mg XL tablet  Comments:    Reason for Stopping:                      amLODIPine (NORVASC) 10 mg tablet  Comments:    Reason for Stopping:                                Patient Follow Up Instructions:    Activity: Activity as tolerated   Diet: Cardiac Diet   Wound Care: None needed           Follow-up Information                  Follow up With  Specialties  Details  Why  Contact Info              Eustaquio Boyden, MD  [facility]      [address] [place] [address]   [phone]                 Olena Heckle, MD  Cardiovascular Disease Physician  Follow up in 2 week(s)    [address]   [facility]   [place] [address]   [phone]                     ________________________________________________________________      Risk of deterioration: High       Condition at Discharge:  Stable   __________________________________________________________________      Disposition   Home with family and home health services      ____________________________________________________________________      Code Status: Full Code   ___________________________________________________________________         Total time in minutes spent coordinating this discharge (includes going over instructions, follow-up, prescriptions, and preparing report for sign off to her PCP) :  >30 minutes      Signed:   Minette Brine, MD

## 2020-10-09 NOTE — Progress Notes (Signed)
Bedside shift change report given to John Blevins/ John Seta, RN (oncoming nurse) by Elpidio Galea, RN (offgoing nurse). Report included the following information SBAR, Kardex, Procedure Summary, Intake/Output, MAR, Accordion, Recent Results, Med Rec Status, Cardiac Rhythm NSR/inverted T wave, and Alarm Parameters .

## 2020-10-09 NOTE — Progress Notes (Signed)
Physician Progress Note      PATIENT:               John Blevins, John Blevins  CSN #:                  [id number]  DOB:                       11/13/1928  ADMIT DATE:       10/07/2020 5:22 AM  DISCH DATE:        10/09/2020 2:25 PM  RESPONDING  PROVIDER #:        Minette Brine MD        QUERY TEXT:    Stage of Chronic Kidney Disease: Please provide further specificity, if known.    Clinical indicators include: ckd, creatinine, bun  Options provided:  -- Chronic kidney disease stage 1  -- Chronic kidney disease stage 2  -- Chronic kidney disease stage 3  -- Chronic kidney disease stage 3a  -- Chronic kidney disease stage 3b  -- Chronic kidney disease stage 4  -- Chronic kidney disease stage 5  -- Chronic kidney disease stage 5, requiring dialysis  -- End stage renal disease  -- Other - I will add my own diagnosis  -- Disagree - Not applicable / Not valid  -- Disagree - Clinically Unable to determine / Unknown        PROVIDER RESPONSE TEXT:    The patient has chronic kidney disease stage 3a.          QUERY TEXT:    Pt admitted with NSTEMI and has Systolic heart failure with EF 35-40% documented in Cardiology consult note dated 9/18. If possible, please document in progress notes and discharge summary further specificity regarding the acuity of CHF:    The medical record reflects the following:  Risk Factors: Hx: HTN; GERD; BPH; DVT  Clinical Indicators: hr: 69; rr: 19; bp: 131/74.....trop. hs: 11,876 ^ 16, 107; pBNP: 4,383.Marland KitchenMarland KitchenMarland KitchenMarland Kitchencxr: shallow respiration, no infiltrate.......Marland KitchenEcho: Left Ventricle: Moderately reduced left ventricular systolic function with a visually estimated EF of 35 - 40%. Left ventricle size is normal. Increased wall thickness. There are regional wall motion abnormalities.  Three is distal anterior, anteroseptal and apical severe hypokinesis.  Treatment: CXR; Trop. hs; pBNP; Cards consult; Echo; Cardiac Cath; Will discharge on low dose lasix after renal function stable    Thank you,    Charolette Child  CDI  Options  provided:  -- Acute on Chronic Systolic CHF/HFrEF  -- Acute Systolic CHF/HFrEF  -- Chronic Systolic CHF/HFrEF  -- Other - I will add my own diagnosis  -- Disagree - Not applicable / Not valid  -- Disagree - Clinically unable to determine / Unknown  -- Refer to Clinical Documentation Reviewer    PROVIDER RESPONSE TEXT:    This patient has chronic systolic CHF/HFrEF.    Query created by: Charolette Child on 10/12/2020 11:56 AM      Electronically signed by:  Minette Brine MD 10/14/2020 8:10 AM

## 2020-10-09 NOTE — Progress Notes (Signed)
Problem: Falls - Risk of  Goal: *Absence of Falls  Description: Document Bridgette Habermann Fall Risk and appropriate interventions in the flowsheet.  Outcome: Progressing Towards Goal  Note: Fall Risk Interventions:            Medication Interventions: Assess postural VS orthostatic hypotension, Bed/chair exit alarm, Evaluate medications/consider consulting pharmacy, Patient to call before getting OOB, Teach patient to arise slowly                   Problem: Patient Education: Go to Patient Education Activity  Goal: Patient/Family Education  Outcome: Progressing Towards Goal     Problem: Pain  Goal: *Control of Pain  Outcome: Progressing Towards Goal  Goal: *PALLIATIVE CARE:  Alleviation of Pain  Outcome: Progressing Towards Goal     Problem: Patient Education: Go to Patient Education Activity  Goal: Patient/Family Education  Outcome: Progressing Towards Goal     Problem: Pressure Injury - Risk of  Goal: *Prevention of pressure injury  Description: Document Braden Scale and appropriate interventions in the flowsheet.  Outcome: Progressing Towards Goal  Note: Pressure Injury Interventions:            Activity Interventions: Increase time out of bed    Mobility Interventions: Pressure redistribution bed/mattress (bed type), Turn and reposition approx. every two hours(pillow and wedges)                          Problem: Patient Education: Go to Patient Education Activity  Goal: Patient/Family Education  Outcome: Progressing Towards Goal     Problem: Patient Education: Go to Patient Education Activity  Goal: Patient/Family Education  Outcome: Progressing Towards Goal     Problem: Cath Lab Procedures: Post-Cath Day of Procedure (Initiate SCIP Measures for Post-Op Care)  Goal: Off Pathway (Use only if patient is Off Pathway)  Outcome: Progressing Towards Goal  Goal: Activity/Safety  Outcome: Progressing Towards Goal  Goal: Consults, if ordered  Outcome: Progressing Towards Goal  Goal: Diagnostic Test/Procedures  Outcome:  Progressing Towards Goal  Goal: Nutrition/Diet  Outcome: Progressing Towards Goal  Goal: Discharge Planning  Outcome: Progressing Towards Goal  Goal: Medications  Outcome: Progressing Towards Goal  Goal: Respiratory  Outcome: Progressing Towards Goal  Goal: Treatments/Interventions/Procedures  Outcome: Progressing Towards Goal  Goal: Psychosocial  Outcome: Progressing Towards Goal  Goal: *Procedure site is without bleeding and signs of infection six hours post sheath removal  Outcome: Progressing Towards Goal  Goal: *Hemodynamically stable  Outcome: Progressing Towards Goal  Goal: *Optimal pain control at patient's stated goal  Outcome: Progressing Towards Goal

## 2020-10-10 ENCOUNTER — Encounter

## 2020-10-10 ENCOUNTER — Other Ambulatory Visit: Payer: Self-pay | Admitting: Family Medicine

## 2020-10-10 DIAGNOSIS — I5020 Unspecified systolic (congestive) heart failure: Secondary | ICD-10-CM | POA: Diagnosis not present

## 2020-10-10 DIAGNOSIS — I214 Non-ST elevation (NSTEMI) myocardial infarction: Secondary | ICD-10-CM | POA: Diagnosis not present

## 2020-10-10 DIAGNOSIS — I10 Essential (primary) hypertension: Secondary | ICD-10-CM | POA: Diagnosis not present

## 2020-10-16 ENCOUNTER — Other Ambulatory Visit: Payer: Self-pay

## 2020-10-16 ENCOUNTER — Ambulatory Visit (INDEPENDENT_AMBULATORY_CARE_PROVIDER_SITE_OTHER): Payer: Medicare Other | Admitting: Family Medicine

## 2020-10-16 ENCOUNTER — Encounter: Payer: Self-pay | Admitting: Family Medicine

## 2020-10-16 VITALS — BP 136/72 | HR 78 | Temp 97.8°F | Ht 69.5 in | Wt 193.0 lb

## 2020-10-16 DIAGNOSIS — E7800 Pure hypercholesterolemia, unspecified: Secondary | ICD-10-CM

## 2020-10-16 DIAGNOSIS — I5020 Unspecified systolic (congestive) heart failure: Secondary | ICD-10-CM | POA: Diagnosis not present

## 2020-10-16 DIAGNOSIS — G250 Essential tremor: Secondary | ICD-10-CM | POA: Diagnosis not present

## 2020-10-16 DIAGNOSIS — I10 Essential (primary) hypertension: Secondary | ICD-10-CM

## 2020-10-16 DIAGNOSIS — I214 Non-ST elevation (NSTEMI) myocardial infarction: Secondary | ICD-10-CM

## 2020-10-16 DIAGNOSIS — I2510 Atherosclerotic heart disease of native coronary artery without angina pectoris: Secondary | ICD-10-CM | POA: Diagnosis not present

## 2020-10-16 DIAGNOSIS — N1831 Chronic kidney disease, stage 3a: Secondary | ICD-10-CM

## 2020-10-16 LAB — CBC WITH DIFFERENTIAL/PLATELET
Basophils Absolute: 0 10*3/uL (ref 0.0–0.1)
Basophils Relative: 0.6 % (ref 0.0–3.0)
Eosinophils Absolute: 0.1 10*3/uL (ref 0.0–0.7)
Eosinophils Relative: 1.6 % (ref 0.0–5.0)
HCT: 33.6 % — ABNORMAL LOW (ref 39.0–52.0)
Hemoglobin: 11.8 g/dL — ABNORMAL LOW (ref 13.0–17.0)
Lymphocytes Relative: 18.4 % (ref 12.0–46.0)
Lymphs Abs: 1.4 10*3/uL (ref 0.7–4.0)
MCHC: 35.1 g/dL (ref 30.0–36.0)
MCV: 85.6 fl (ref 78.0–100.0)
Monocytes Absolute: 0.6 10*3/uL (ref 0.1–1.0)
Monocytes Relative: 7.5 % (ref 3.0–12.0)
Neutro Abs: 5.4 10*3/uL (ref 1.4–7.7)
Neutrophils Relative %: 71.9 % (ref 43.0–77.0)
Platelets: 287 10*3/uL (ref 150.0–400.0)
RBC: 3.93 Mil/uL — ABNORMAL LOW (ref 4.22–5.81)
RDW: 14 % (ref 11.5–15.5)
WBC: 7.5 10*3/uL (ref 4.0–10.5)

## 2020-10-16 LAB — BASIC METABOLIC PANEL
BUN: 17 mg/dL (ref 6–23)
CO2: 28 mEq/L (ref 19–32)
Calcium: 9 mg/dL (ref 8.4–10.5)
Chloride: 100 mEq/L (ref 96–112)
Creatinine, Ser: 1.41 mg/dL (ref 0.40–1.50)
GFR: 43.89 mL/min — ABNORMAL LOW (ref >60.00)
Glucose, Bld: 124 mg/dL — ABNORMAL HIGH (ref 70–99)
Potassium: 5.2 mEq/L — ABNORMAL HIGH (ref 3.5–5.1)
Sodium: 135 mEq/L (ref 135–145)

## 2020-10-16 NOTE — Assessment & Plan Note (Signed)
Chronic, stable. Update Cr with recent new medications including lasix 20mg  QOD.

## 2020-10-16 NOTE — Assessment & Plan Note (Signed)
Now on atorvastatin 40mg  daily s/p MI, tolerating well.

## 2020-10-16 NOTE — Assessment & Plan Note (Signed)
BP well-controlled on current regimen.  

## 2020-10-16 NOTE — Assessment & Plan Note (Signed)
See above

## 2020-10-16 NOTE — Progress Notes (Signed)
Patient ID: Shawn Andrews, male    DOB: 1928-10-24, 87 y.o.   MRN: [id number]  This visit was conducted in person.  BP 136/72   Pulse 78   Temp 97.8 F (36.6 C) (Temporal)   Ht 5' 9.5" (1.765 m)   Wt 193 lb (87.5 kg)   SpO2 96%   BMI 28.09 kg/m    CC: hosp f/u visit  Subjective:   HPI: Shawn Andrews is a 87 y.o. male presenting on 10/16/2020 for Hospitalization Follow-up   Here with wife, [name].   Recent hospitalization in [place] for NSTEMI, LHC showed subtotally occluded LAD with collateral from RCA, treated with DES x2. Other CA blockages to be treated medically. Started on aspirin and plavix as well as isosorbide, lasix 20mg  QOD and lipitor. EF dropped to 35-40%. Planned continue metoprolol, losartan. Hospitalization complicated by agitation thought due to hospital delirium.   Since home, feeling well. Drinking plenty of water. Denies chest pain/tightness, dyspnea or dizziness. No chest tightness with exertion. Yesterday walked a long distance while shopping, without any trouble.  Continues seeing gym 3x/wk.  He's been using melatonin (only took one).   Upcoming appt with Dr Nehemiah Massed @ [facility] 10/3.    Admit date: 10/07/2020 Discharge date: 10/09/2020 TCM hosp f/u phone call not performed  Rec labwork 1-2 wks after hospitalization  DISCHARGE DIAGNOSIS: Nstemi  S/p cardiac cath with PCI collateral from RCA  Aki  Agitation Htn  Dvt      Relevant past medical, surgical, family and social history reviewed and updated as indicated. Interim medical history since our last visit reviewed. Allergies and medications reviewed and updated. Outpatient Medications Prior to Visit  Medication Sig Dispense Refill   atorvastatin (LIPITOR) 40 MG tablet Take 1 tablet by mouth at bedtime.     clopidogrel (PLAVIX) 75 MG tablet Take 1 tablet by mouth daily.     furosemide (LASIX) 20 MG tablet Take 1 tablet by mouth every other day.     isosorbide mononitrate  (IMDUR) 30 MG 24 hr tablet Take 1 tablet by mouth daily.     nitroGLYCERIN (NITROSTAT) 0.4 MG SL tablet Place under the tongue.     primidone (MYSOLINE) 50 MG tablet TAKE 2 TABLETS BY MOUTH IN  THE MORNING 1 TABLET BY  MOUTH IN THE AFTERNOON 2  TABLETS BY MOUTH AT BEDTIME     acetaminophen (TYLENOL 8 HOUR) 650 MG CR tablet Take 1 tablet (650 mg total) by mouth 2 (two) times a day.     aspirin (ASPIRIN 81) 81 MG EC tablet Take 1 tablet (81 mg total) by mouth every Monday, Wednesday, and Friday.     Cholecalciferol (VITAMIN D3) 25 MCG (1000 UT) CAPS Take 1 capsule (1,000 Units total) by mouth daily. 30 capsule    clindamycin (CLEOCIN) 150 MG capsule Take 4 capsules (600 mg total) by mouth as directed. Prior to dental procedures     doxazosin (CARDURA) 4 MG tablet TAKE 1 TABLET BY MOUTH  DAILY 90 tablet 3   famotidine (PEPCID) 20 MG tablet Take 1 tablet (20 mg total) by mouth at bedtime. 90 tablet 1   finasteride (PROSCAR) 5 MG tablet TAKE 1 TABLET BY MOUTH  DAILY 90 tablet 3   loratadine (CLARITIN) 10 MG tablet Take 1 tablet (10 mg total) by mouth daily as needed for allergies. 30 tablet    losartan (COZAAR) 25 MG tablet TAKE 1 TABLET BY MOUTH  DAILY 90 tablet 3  metoprolol tartrate (LOPRESSOR) 50 MG tablet Take 0.5 tablets (25 mg total) by mouth 2 (two) times daily.     primidone (MYSOLINE) 50 MG tablet Take 2 tablets (100 mg total) by mouth 3 (three) times daily.     rOPINIRole (REQUIP) 0.5 MG tablet Take 1 tablet (0.5 mg total) by mouth in the morning and at bedtime.     vitamin B-12 (CYANOCOBALAMIN) 500 MCG tablet Take 1 tablet (500 mcg total) by mouth once a week.     amLODipine (NORVASC) 10 MG tablet TAKE 1 TABLET BY MOUTH  DAILY 90 tablet 3   clindamycin (CLEOCIN) 150 MG capsule Take 4 capsules by mouth as directed.     FLAXSEED, LINSEED, PO Take 1 tablet by mouth 2 (two) times daily.      metoprolol tartrate (LOPRESSOR) 50 MG tablet TAKE 1 TABLET BY MOUTH  TWICE DAILY 180 tablet 3   No  facility-administered medications prior to visit.     Per HPI unless specifically indicated in ROS section below Review of Systems  Objective:  BP 136/72   Pulse 78   Temp 97.8 F (36.6 C) (Temporal)   Ht 5' 9.5" (1.765 m)   Wt 193 lb (87.5 kg)   SpO2 96%   BMI 28.09 kg/m   Wt Readings from Last 3 Encounters:  10/16/20 193 lb (87.5 kg)  08/30/20 196 lb (88.9 kg)  11/09/19 193 lb 12.8 oz (87.9 kg)      Physical Exam Vitals and nursing note reviewed.  Constitutional:      Appearance: Normal appearance. He is not ill-appearing.  Cardiovascular:     Rate and Rhythm: Normal rate and regular rhythm.     Pulses: Normal pulses.     Heart sounds: Normal heart sounds. No murmur heard. Pulmonary:     Effort: Pulmonary effort is normal. No respiratory distress.     Breath sounds: Normal breath sounds. No wheezing, rhonchi or rales.  Musculoskeletal:     Right lower leg: Edema (tr) present.     Left lower leg: Edema (tr) present.  Skin:    General: Skin is warm and dry.     Findings: No rash.  Neurological:     Mental Status: He is alert.  Psychiatric:        Mood and Affect: Mood normal.        Behavior: Behavior normal.      Results for orders placed or performed in visit on 08/25/20  Vitamin B12  Result Value Ref Range   Vitamin B-12 >1550 (H) 211 - 911 pg/mL  CBC with Differential/Platelet  Result Value Ref Range   WBC 6.2 4.0 - 10.5 K/uL   RBC 4.48 4.22 - 5.81 Mil/uL   Hemoglobin 13.4 13.0 - 17.0 g/dL   HCT 38.9 (L) 39.0 - 52.0 %   MCV 86.7 78.0 - 100.0 fl   MCHC 34.6 30.0 - 36.0 g/dL   RDW 13.8 11.5 - 15.5 %   Platelets 191.0 150.0 - 400.0 K/uL   Neutrophils Relative % 59.1 43.0 - 77.0 %   Lymphocytes Relative 31.0 12.0 - 46.0 %   Monocytes Relative 7.1 3.0 - 12.0 %   Eosinophils Relative 2.3 0.0 - 5.0 %   Basophils Relative 0.5 0.0 - 3.0 %   Neutro Abs 3.7 1.4 - 7.7 K/uL   Lymphs Abs 1.9 0.7 - 4.0 K/uL   Monocytes Absolute 0.4 0.1 - 1.0 K/uL   Eosinophils  Absolute 0.1 0.0 - 0.7 K/uL  Basophils Absolute 0.0 0.0 - 0.1 K/uL  Comprehensive metabolic panel  Result Value Ref Range   Sodium 138 135 - 145 mEq/L   Potassium 4.6 3.5 - 5.1 mEq/L   Chloride 103 96 - 112 mEq/L   CO2 29 19 - 32 mEq/L   Glucose, Bld 111 (H) 70 - 99 mg/dL   BUN 21 6 - 23 mg/dL   Creatinine, Ser 1.21 0.40 - 1.50 mg/dL   Total Bilirubin 0.5 0.2 - 1.2 mg/dL   Alkaline Phosphatase 60 39 - 117 U/L   AST 15 0 - 37 U/L   ALT 11 0 - 53 U/L   Total Protein 6.3 6.0 - 8.3 g/dL   Albumin 4.2 3.5 - 5.2 g/dL   GFR 52.79 (L) >60.00 mL/min   Calcium 9.0 8.4 - 10.5 mg/dL  Lipid panel  Result Value Ref Range   Cholesterol 212 (H) 0 - 200 mg/dL   Triglycerides 119.0 0.0 - 149.0 mg/dL   HDL 49.80 >39.00 mg/dL   VLDL 23.8 0.0 - 40.0 mg/dL   LDL Cholesterol 138 (H) 0 - 99 mg/dL   Total CHOL/HDL Ratio 4    NonHDL 162.15   VITAMIN D 25 Hydroxy (Vit-D Deficiency, Fractures)  Result Value Ref Range   VITD 41.59 30.00 - 100.00 ng/mL    Assessment & Plan:  This visit occurred during the SARS-CoV-2 public health emergency.  Safety protocols were in place, including screening questions prior to the visit, additional usage of staff PPE, and extensive cleaning of exam room while observing appropriate contact time as indicated for disinfecting solutions.   Problem List Items Addressed This Visit     HLD (hyperlipidemia)    Now on atorvastatin 40mg  daily s/p MI, tolerating well.       Relevant Medications   atorvastatin (LIPITOR) 40 MG tablet   furosemide (LASIX) 20 MG tablet   isosorbide mononitrate (IMDUR) 30 MG 24 hr tablet   nitroGLYCERIN (NITROSTAT) 0.4 MG SL tablet   metoprolol tartrate (LOPRESSOR) 50 MG tablet   Essential hypertension    BP well controlled on current regimen.       Relevant Medications   atorvastatin (LIPITOR) 40 MG tablet   furosemide (LASIX) 20 MG tablet   isosorbide mononitrate (IMDUR) 30 MG 24 hr tablet   nitroGLYCERIN (NITROSTAT) 0.4 MG SL tablet    metoprolol tartrate (LOPRESSOR) 50 MG tablet   Essential tremor    Stable period on primodone 100mg  TID      CKD (chronic kidney disease) stage 3, GFR 30-59 ml/min (HCC)    Chronic, stable. Update Cr with recent new medications including lasix 20mg  QOD.       CAD (coronary artery disease)    See above.       Relevant Medications   atorvastatin (LIPITOR) 40 MG tablet   furosemide (LASIX) 20 MG tablet   isosorbide mononitrate (IMDUR) 30 MG 24 hr tablet   nitroGLYCERIN (NITROSTAT) 0.4 MG SL tablet   metoprolol tartrate (LOPRESSOR) 50 MG tablet   NSTEMI (non-ST elevated myocardial infarction) ([place]) - Primary    Recent hospitalization in [place] for NSTEMI treated with 2 DES to LAD. He does have cardiology f/u planned for next week Nehemiah Massed).       Relevant Medications   atorvastatin (LIPITOR) 40 MG tablet   furosemide (LASIX) 20 MG tablet   isosorbide mononitrate (IMDUR) 30 MG 24 hr tablet   nitroGLYCERIN (NITROSTAT) 0.4 MG SL tablet   metoprolol tartrate (LOPRESSOR) 50 MG tablet  Other Relevant Orders   CBC with Differential/Platelet   Basic metabolic panel   HFrEF (heart failure with reduced ejection fraction) ([place])    Ischemic, initially noted after MI 09/2020.  Has cards f/u planned.       Relevant Medications   atorvastatin (LIPITOR) 40 MG tablet   furosemide (LASIX) 20 MG tablet   isosorbide mononitrate (IMDUR) 30 MG 24 hr tablet   nitroGLYCERIN (NITROSTAT) 0.4 MG SL tablet   metoprolol tartrate (LOPRESSOR) 50 MG tablet     No orders of the defined types were placed in this encounter.  Orders Placed This Encounter  Procedures   CBC with Differential/Platelet   Basic metabolic panel     Patient Instructions  Labs today  I'm glad you're seeing the heart doctor next week.  Return in 6 months for follow up visit.   Follow up plan: Return in about 6 months (around 04/15/2021) for follow up visit.  Ria Bush, MD

## 2020-10-16 NOTE — Assessment & Plan Note (Signed)
Stable period on primodone 100mg  TID

## 2020-10-16 NOTE — Assessment & Plan Note (Signed)
Ischemic, initially noted after MI 09/2020.  Has cards f/u planned.

## 2020-10-16 NOTE — Patient Instructions (Addendum)
Labs today  I'm glad you're seeing the heart doctor next week.  Return in 6 months for follow up visit.

## 2020-10-16 NOTE — Assessment & Plan Note (Signed)
Recent hospitalization in [place] for NSTEMI treated with 2 DES to LAD. He does have cardiology f/u planned for next week Nehemiah Massed).

## 2020-10-20 ENCOUNTER — Telehealth: Payer: Self-pay

## 2020-10-20 NOTE — Telephone Encounter (Signed)
Spoke with pt/pt's wife, [name], relaying results.  Verbalizes understanding.

## 2020-10-20 NOTE — Telephone Encounter (Signed)
Lvm asking pt to call back.  Need to relay results, which were sent via [place].  Labs: Your kidneys returned a bit more impaired than normal - try to increase water by 1 8oz glass per day, especially on days you take Lasix. Your potassium was slightly elevated - likely from worsened kidney function. I would hold any potassium you may be taking, and consider dropping Lasix to 2-3 times weekly, mainly if you note leg swelling develop.  You were mildly anemic - we will monitor this on new blood thinner.

## 2020-10-23 DIAGNOSIS — I2510 Atherosclerotic heart disease of native coronary artery without angina pectoris: Secondary | ICD-10-CM | POA: Diagnosis not present

## 2020-10-23 DIAGNOSIS — R9431 Abnormal electrocardiogram [ECG] [EKG]: Secondary | ICD-10-CM | POA: Diagnosis not present

## 2020-10-23 DIAGNOSIS — I10 Essential (primary) hypertension: Secondary | ICD-10-CM | POA: Diagnosis not present

## 2020-10-23 DIAGNOSIS — I5022 Chronic systolic (congestive) heart failure: Secondary | ICD-10-CM | POA: Diagnosis not present

## 2020-10-23 DIAGNOSIS — Z7689 Persons encountering health services in other specified circumstances: Secondary | ICD-10-CM | POA: Diagnosis not present

## 2020-10-23 DIAGNOSIS — E782 Mixed hyperlipidemia: Secondary | ICD-10-CM | POA: Diagnosis not present

## 2020-11-06 DIAGNOSIS — H52203 Unspecified astigmatism, bilateral: Secondary | ICD-10-CM | POA: Diagnosis not present

## 2020-11-06 DIAGNOSIS — H26491 Other secondary cataract, right eye: Secondary | ICD-10-CM | POA: Diagnosis not present

## 2020-11-14 DIAGNOSIS — I2510 Atherosclerotic heart disease of native coronary artery without angina pectoris: Secondary | ICD-10-CM | POA: Diagnosis not present

## 2020-11-14 DIAGNOSIS — I5022 Chronic systolic (congestive) heart failure: Secondary | ICD-10-CM | POA: Diagnosis not present

## 2020-11-17 ENCOUNTER — Telehealth: Payer: Self-pay | Admitting: Family Medicine

## 2020-11-17 DIAGNOSIS — N1831 Chronic kidney disease, stage 3a: Secondary | ICD-10-CM

## 2020-11-17 NOTE — Telephone Encounter (Signed)
Pt returning call

## 2020-11-17 NOTE — Telephone Encounter (Addendum)
Pt/pt's wife, [name], returning call.  States that pt is doing ok, but upset about diet pt is on due to potassium issues.  Frustrated about all the things he cannot have.   Mrs. Carrillo says pt has in notes from [facility] that pt needs to have kidney function and electrolytes checked by PCP in 1-3 wks after procedure at [facility].  I explained pt had hosp f/u with Dr. Darnell Level on 10/16/20, labs were done and I went over the labs with both of them on 10/20/20 on the phone.  Mrs. Gindlesperger insists that she was with pt the day of that visit and pt did not have any labs done.  The tone in her voice was changing and volume was increasing so I decided to end the conversation.  I informed her Dr. Darnell Level has left for the day but I will relay the info to him and ask about any additional labs that may need to be done.  And the labs would need to be done at [facility] because [place] is to far to drive.  Mrs. Cahue was satisfied with this and would like a call back at [phone].  Informed her I will try to call her back Monday.  She expresses her thanks.    By chance, do I have it wrong?

## 2020-11-17 NOTE — Telephone Encounter (Signed)
Lvm asking pt to call back.  Need an update on pt and what pt's recent potassium reading is.

## 2020-11-17 NOTE — Telephone Encounter (Signed)
Pt called again. Requesting blood work to be done to see if kidneys have improved. Want a follow up from the [place] appt. Wondering why has there not been a lab appt made yet.  Pt showing frustration due to not talking to anyone 4 times.

## 2020-11-17 NOTE — Telephone Encounter (Signed)
Pt wife called in stating that the pt potassium level in his blood went up in the urine. Pt wife stated that they saw Dr. Darnell Level on 9/26 and no blood work was done. They only did blood work in [place] and pt wife is concerned about blood work being done. Pt wife would like to be called back regarding this matter

## 2020-11-20 ENCOUNTER — Telehealth: Payer: Self-pay | Admitting: Family Medicine

## 2020-11-20 DIAGNOSIS — I10 Essential (primary) hypertension: Secondary | ICD-10-CM | POA: Diagnosis not present

## 2020-11-20 DIAGNOSIS — E782 Mixed hyperlipidemia: Secondary | ICD-10-CM | POA: Diagnosis not present

## 2020-11-20 DIAGNOSIS — I5022 Chronic systolic (congestive) heart failure: Secondary | ICD-10-CM | POA: Diagnosis not present

## 2020-11-20 DIAGNOSIS — I2510 Atherosclerotic heart disease of native coronary artery without angina pectoris: Secondary | ICD-10-CM | POA: Diagnosis not present

## 2020-11-20 NOTE — Progress Notes (Signed)
  Chronic Care Management   Note  11/20/2020 Name: Shawn Andrews MRN: [id number] DOB: February 26, 1928  Shawn Andrews is a 87 y.o. year old male who is a primary care patient of Ria Bush, MD. I reached out to Shawn Andrews by phone today in response to a referral sent by Shawn Andrews's PCP, Ria Bush, MD.   Shawn Andrews was given information about Chronic Care Management services today including:  CCM service includes personalized support from designated clinical staff supervised by his physician, including individualized plan of care and coordination with other care providers 24/7 contact phone numbers for assistance for urgent and routine care needs. Service will only be billed when office clinical staff spend 20 minutes or more in a month to coordinate care. Only one practitioner may furnish and bill the service in a calendar month. The patient may stop CCM services at any time (effective at the end of the month) by phone call to the office staff.   ETHEL Dowen / SPOUSE verbally agreed to assistance and services provided by embedded care coordination/care management team today.  Follow up plan:   Tatjana Secretary/administrator

## 2020-11-20 NOTE — Telephone Encounter (Signed)
Tried calling the patient and he did not answer. LVM for patient to call back.   

## 2020-11-20 NOTE — Telephone Encounter (Signed)
You are correct pt did have labs post- hospitalization at our office.  However reasonable to repeat lab work to see if he needs to continue low potassium diet or not - I have ordered.

## 2020-11-20 NOTE — Chronic Care Management (AMB) (Signed)
  Chronic Care Management   Outreach Note  11/20/2020 Name: Shawn Andrews MRN: [id number] DOB: 03-14-1928  Referred by: Ria Bush, MD Reason for referral : No chief complaint on file.   An unsuccessful telephone outreach was attempted today. The patient was referred to the pharmacist for assistance with care management and care coordination.   Follow Up Plan:   Tatjana Dellinger Upstream Scheduler

## 2020-11-20 NOTE — Addendum Note (Signed)
Addended by: Ria Bush on: 11/20/2020 07:35 AM   Modules accepted: Orders

## 2020-11-21 NOTE — Telephone Encounter (Signed)
Lvm asking pt/pt's wife, [name] (on dpr) to call back.  Need to schedule lab visit at [facility].

## 2020-11-22 ENCOUNTER — Other Ambulatory Visit (INDEPENDENT_AMBULATORY_CARE_PROVIDER_SITE_OTHER): Payer: Medicare Other

## 2020-11-22 ENCOUNTER — Other Ambulatory Visit: Payer: Self-pay

## 2020-11-22 DIAGNOSIS — N1831 Chronic kidney disease, stage 3a: Secondary | ICD-10-CM | POA: Diagnosis not present

## 2020-11-22 LAB — CBC WITH DIFFERENTIAL/PLATELET
Basophils Absolute: 0 10*3/uL (ref 0.0–0.1)
Basophils Relative: 0.4 % (ref 0.0–3.0)
Eosinophils Absolute: 0.1 10*3/uL (ref 0.0–0.7)
Eosinophils Relative: 1.3 % (ref 0.0–5.0)
HCT: 30.3 % — ABNORMAL LOW (ref 39.0–52.0)
Hemoglobin: 10.7 g/dL — ABNORMAL LOW (ref 13.0–17.0)
Lymphocytes Relative: 15 % (ref 12.0–46.0)
Lymphs Abs: 1.2 10*3/uL (ref 0.7–4.0)
MCHC: 35.5 g/dL (ref 30.0–36.0)
MCV: 86.1 fl (ref 78.0–100.0)
Monocytes Absolute: 0.6 10*3/uL (ref 0.1–1.0)
Monocytes Relative: 6.9 % (ref 3.0–12.0)
Neutro Abs: 6.2 10*3/uL (ref 1.4–7.7)
Neutrophils Relative %: 76.4 % (ref 43.0–77.0)
Platelets: 238 10*3/uL (ref 150.0–400.0)
RBC: 3.52 Mil/uL — ABNORMAL LOW (ref 4.22–5.81)
RDW: 14.9 % (ref 11.5–15.5)
WBC: 8.1 10*3/uL (ref 4.0–10.5)

## 2020-11-22 LAB — RENAL FUNCTION PANEL
Albumin: 4 g/dL (ref 3.5–5.2)
BUN: 23 mg/dL (ref 6–23)
CO2: 29 mEq/L (ref 19–32)
Calcium: 9.1 mg/dL (ref 8.4–10.5)
Chloride: 96 mEq/L (ref 96–112)
Creatinine, Ser: 1.65 mg/dL — ABNORMAL HIGH (ref 0.40–1.50)
GFR: 36.32 mL/min — ABNORMAL LOW (ref >60.00)
Glucose, Bld: 94 mg/dL (ref 70–99)
Phosphorus: 3.2 mg/dL (ref 2.3–4.6)
Potassium: 4.9 mEq/L (ref 3.5–5.1)
Sodium: 130 mEq/L — ABNORMAL LOW (ref 135–145)

## 2020-11-22 NOTE — Telephone Encounter (Signed)
Lvm asking pt/pt's wife, [name] (on dpr) to call back.  Need to schedule lab visit at [facility].

## 2020-11-22 NOTE — Telephone Encounter (Signed)
Pt's wife returning call.  States pt had labs done this AM.

## 2020-11-23 ENCOUNTER — Encounter: Payer: Self-pay | Admitting: Family Medicine

## 2020-11-24 ENCOUNTER — Encounter: Payer: Self-pay | Admitting: Family Medicine

## 2020-11-24 DIAGNOSIS — N1831 Chronic kidney disease, stage 3a: Secondary | ICD-10-CM

## 2020-11-29 ENCOUNTER — Other Ambulatory Visit: Payer: Self-pay

## 2020-11-29 ENCOUNTER — Other Ambulatory Visit (INDEPENDENT_AMBULATORY_CARE_PROVIDER_SITE_OTHER): Payer: Medicare Other

## 2020-11-29 DIAGNOSIS — N1831 Chronic kidney disease, stage 3a: Secondary | ICD-10-CM | POA: Diagnosis not present

## 2020-11-30 ENCOUNTER — Encounter: Payer: Self-pay | Admitting: Family Medicine

## 2020-11-30 LAB — RENAL FUNCTION PANEL
Albumin: 3.8 g/dL (ref 3.5–5.2)
BUN: 23 mg/dL (ref 6–23)
CO2: 26 mEq/L (ref 19–32)
Calcium: 8.3 mg/dL — ABNORMAL LOW (ref 8.4–10.5)
Chloride: 96 mEq/L (ref 96–112)
Creatinine, Ser: 1.51 mg/dL — ABNORMAL HIGH (ref 0.40–1.50)
GFR: 40.39 mL/min — ABNORMAL LOW (ref >60.00)
Glucose, Bld: 108 mg/dL — ABNORMAL HIGH (ref 70–99)
Phosphorus: 3.5 mg/dL (ref 2.3–4.6)
Potassium: 4.4 mEq/L (ref 3.5–5.1)
Sodium: 128 mEq/L — ABNORMAL LOW (ref 135–145)

## 2020-12-01 ENCOUNTER — Encounter: Payer: Self-pay | Admitting: Family Medicine

## 2020-12-01 NOTE — Telephone Encounter (Signed)
Replied via result note.

## 2020-12-04 DIAGNOSIS — R251 Tremor, unspecified: Secondary | ICD-10-CM | POA: Diagnosis not present

## 2020-12-05 DIAGNOSIS — I5022 Chronic systolic (congestive) heart failure: Secondary | ICD-10-CM | POA: Diagnosis not present

## 2020-12-05 DIAGNOSIS — I2510 Atherosclerotic heart disease of native coronary artery without angina pectoris: Secondary | ICD-10-CM | POA: Diagnosis not present

## 2020-12-05 DIAGNOSIS — I10 Essential (primary) hypertension: Secondary | ICD-10-CM | POA: Diagnosis not present

## 2020-12-05 DIAGNOSIS — E782 Mixed hyperlipidemia: Secondary | ICD-10-CM | POA: Diagnosis not present

## 2020-12-07 ENCOUNTER — Encounter: Payer: Self-pay | Admitting: Family Medicine

## 2020-12-07 DIAGNOSIS — U071 COVID-19: Secondary | ICD-10-CM | POA: Diagnosis not present

## 2020-12-07 DIAGNOSIS — R059 Cough, unspecified: Secondary | ICD-10-CM | POA: Diagnosis not present

## 2020-12-07 DIAGNOSIS — Z20822 Contact with and (suspected) exposure to covid-19: Secondary | ICD-10-CM | POA: Diagnosis not present

## 2020-12-08 ENCOUNTER — Other Ambulatory Visit: Payer: Self-pay

## 2020-12-08 ENCOUNTER — Encounter: Payer: Self-pay | Admitting: Family Medicine

## 2020-12-08 ENCOUNTER — Telehealth (INDEPENDENT_AMBULATORY_CARE_PROVIDER_SITE_OTHER): Payer: Medicare Other | Admitting: Family Medicine

## 2020-12-08 DIAGNOSIS — U071 COVID-19: Secondary | ICD-10-CM | POA: Diagnosis not present

## 2020-12-08 MED ORDER — MOLNUPIRAVIR EUA 200MG CAPSULE: 4.0000 | ORAL_CAPSULE | Freq: Two times a day (BID) | ORAL | 0 refills | Status: AC

## 2020-12-08 NOTE — Assessment & Plan Note (Signed)
Reviewed currently approved EUA treatments.  Reviewed expected course of illness, anticipated course of recovery, as well as red flags to suggest COVID pneumonia and/or to seek urgent in-person care. Reviewed latest CDC isolation/quarantine guidelines.  Encouraged fluids and rest. Reviewed further supportive care measures at home including vit C 500mg  bid, vit D 2000 IU daily, zinc 100mg  daily, tylenol PRN, pepcid 20mg  BID PRN.   Recommend:  Molnupiravir given kidney function and drug interactions.  Paxlovid drug interactions:  Atorvastatin   Clindamycin  Clopidogrel  Doxazosin  Primidone

## 2020-12-08 NOTE — Telephone Encounter (Signed)
Please schedule virtual for today at 4pm.  If we have a no show, would try to get him seen sooner.

## 2020-12-08 NOTE — Progress Notes (Signed)
Patient ID: Shawn Andrews, male    DOB: [birth year], 87 y.o.   MRN: [id number]  Virtual visit completed through [place], a video enabled telemedicine application. Andrews to national recommendations of social distancing Andrews to COVID-19, a virtual visit is felt to be most appropriate for this patient at this time. Reviewed limitations, risks, security and privacy concerns of performing a virtual visit and the availability of in person appointments. I also reviewed that there may be a patient responsible charge related to this service. The patient agreed to proceed.   Patient location: home Provider location: [place] at [facility], office Persons participating in this virtual visit: patient, provider, wife [name]  If any vitals were documented, they were collected by patient at home unless specified below.    BP 132/71   Pulse 72   Temp 97.8 F (36.6 C)   Ht 5' 9.5" (1.765 m)   Wt 184 lb (83.5 kg)   BMI 26.78 kg/m    CC: COVID  Subjective:   HPI: Shawn Andrews is a 87 y.o. male presenting on 12/08/2020 for Covid Positive (C/o cough and body aches.   Sxs started 12/05/20.  Seen at [place] on 12/05/20.  Tested pos for COVID, neg flu. )   First day of symptoms: 11/15 Tested COVID positive: 11/15  Current symptoms: cough largely improved, chest congestion and hoarseness.  Cough has largely improved. No: fevers/chills, headache, body aches, dyspnea, abd pain, diarrhea, loss of taste or smell, ST. Appetite remains.  Treatments to date: tylenol  Risk factors include: age, HTN, CKD Wife has been feeling well without symptoms.   [facility]  COVID vaccination status: all [place] 04/2019, 05/2019, booster 10/2019, 04/2020      Relevant past medical, surgical, family and social history reviewed and updated as indicated. Interim medical history since our last visit reviewed. Allergies and medications reviewed and updated. Outpatient Medications  Prior to Visit  Medication Sig Dispense Refill   acetaminophen (TYLENOL 8 HOUR) 650 MG CR tablet Take 1 tablet (650 mg total) by mouth 2 (two) times a day.     aspirin (ASPIRIN 81) 81 MG EC tablet Take 1 tablet (81 mg total) by mouth every Monday, Wednesday, and Friday.     atorvastatin (LIPITOR) 40 MG tablet Take 1 tablet by mouth at bedtime.     Cholecalciferol (VITAMIN D3) 25 MCG (1000 UT) CAPS Take 1 capsule (1,000 Units total) by mouth daily. 30 capsule    clindamycin (CLEOCIN) 150 MG capsule Take 4 capsules (600 mg total) by mouth as directed. Prior to dental procedures     clopidogrel (PLAVIX) 75 MG tablet Take 1 tablet by mouth daily.     doxazosin (CARDURA) 4 MG tablet TAKE 1 TABLET BY MOUTH  DAILY 90 tablet 3   famotidine (PEPCID) 20 MG tablet Take 1 tablet (20 mg total) by mouth at bedtime. 90 tablet 1   finasteride (PROSCAR) 5 MG tablet TAKE 1 TABLET BY MOUTH  DAILY 90 tablet 3   furosemide (LASIX) 20 MG tablet Take 1 tablet by mouth every other day.     isosorbide mononitrate (IMDUR) 30 MG 24 hr tablet Take 1 tablet by mouth daily.     loratadine (CLARITIN) 10 MG tablet Take 1 tablet (10 mg total) by mouth daily as needed for allergies. 30 tablet    losartan (COZAAR) 25 MG tablet TAKE 1 TABLET BY MOUTH  DAILY 90 tablet 3   metoprolol tartrate (  LOPRESSOR) 50 MG tablet Take 0.5 tablets (25 mg total) by mouth 2 (two) times daily.     nitroGLYCERIN (NITROSTAT) 0.4 MG SL tablet Place under the tongue.     primidone (MYSOLINE) 50 MG tablet Take 2 tablets (100 mg total) by mouth 3 (three) times daily.     rOPINIRole (REQUIP) 0.5 MG tablet Take 1 tablet (0.5 mg total) by mouth in the morning and at bedtime.     vitamin B-12 (CYANOCOBALAMIN) 500 MCG tablet Take 1 tablet (500 mcg total) by mouth once a week.     No facility-administered medications prior to visit.     Per HPI unless specifically indicated in ROS section below Review of Systems Objective:  BP 132/71   Pulse 72   Temp  97.8 F (36.6 C)   Ht 5' 9.5" (1.765 m)   Wt 184 lb (83.5 kg)   BMI 26.78 kg/m   Wt Readings from Last 3 Encounters:  12/08/20 184 lb (83.5 kg)  10/16/20 193 lb (87.5 kg)  08/30/20 196 lb (88.9 kg)       Physical exam: Gen: alert, NAD, not ill appearing Pulm: speaks in complete sentences without increased work of breathing Psych: normal mood, normal thought content      Results for orders placed or performed in visit on 11/29/20  Renal function panel  Result Value Ref Range   Sodium 128 (L) 135 - 145 mEq/L   Potassium 4.4 3.5 - 5.1 mEq/L   Chloride 96 96 - 112 mEq/L   CO2 26 19 - 32 mEq/L   Albumin 3.8 3.5 - 5.2 g/dL   BUN 23 6 - 23 mg/dL   Creatinine, Ser 1.51 (H) 0.40 - 1.50 mg/dL   Glucose, Bld 108 (H) 70 - 99 mg/dL   Phosphorus 3.5 2.3 - 4.6 mg/dL   GFR 40.39 (L) >60.00 mL/min   Calcium 8.3 (L) 8.4 - 10.5 mg/dL   Assessment & Plan:   Problem List Items Addressed This Visit     COVID-19 virus infection    Reviewed currently approved EUA treatments.  Reviewed expected course of illness, anticipated course of recovery, as well as red flags to suggest COVID pneumonia and/or to seek urgent in-person care. Reviewed latest CDC isolation/quarantine guidelines.  Encouraged fluids and rest. Reviewed further supportive care measures at home including vit C 500mg  bid, vit D 2000 IU daily, zinc 100mg  daily, tylenol PRN, pepcid 20mg  BID PRN.   Recommend:  Molnupiravir given kidney function and drug interactions.  Paxlovid drug interactions:  Atorvastatin   Clindamycin  Clopidogrel  Doxazosin  Primidone       Relevant Medications   molnupiravir EUA (LAGEVRIO) 200 mg CAPS capsule     Meds ordered this encounter  Medications   molnupiravir EUA (LAGEVRIO) 200 mg CAPS capsule    Sig: Take 4 capsules (800 mg total) by mouth 2 (two) times daily for 5 days.    Dispense:  40 capsule    Refill:  0   No orders of the defined types were placed in this encounter.   I  discussed the assessment and treatment plan with the patient. The patient was provided an opportunity to ask questions and all were answered. The patient agreed with the plan and demonstrated an understanding of the instructions. The patient was advised to call back or seek an in-person evaluation if the symptoms worsen or if the condition fails to improve as anticipated.  Follow up plan: No follow-ups on file.  Ria Bush,  MD

## 2020-12-08 NOTE — Telephone Encounter (Signed)
Pt has been scheduled.  °

## 2020-12-13 ENCOUNTER — Encounter: Payer: Self-pay | Admitting: Family Medicine

## 2020-12-13 ENCOUNTER — Ambulatory Visit (INDEPENDENT_AMBULATORY_CARE_PROVIDER_SITE_OTHER): Payer: Medicare Other | Admitting: Family Medicine

## 2020-12-13 ENCOUNTER — Other Ambulatory Visit: Payer: Self-pay

## 2020-12-13 VITALS — BP 138/62 | HR 68 | Temp 97.7°F | Ht 69.5 in | Wt 182.2 lb

## 2020-12-13 DIAGNOSIS — N1831 Chronic kidney disease, stage 3a: Secondary | ICD-10-CM

## 2020-12-13 DIAGNOSIS — I5020 Unspecified systolic (congestive) heart failure: Secondary | ICD-10-CM

## 2020-12-13 DIAGNOSIS — K5900 Constipation, unspecified: Secondary | ICD-10-CM | POA: Diagnosis not present

## 2020-12-13 DIAGNOSIS — E871 Hypo-osmolality and hyponatremia: Secondary | ICD-10-CM

## 2020-12-13 DIAGNOSIS — U071 COVID-19: Secondary | ICD-10-CM | POA: Diagnosis not present

## 2020-12-13 LAB — CBC WITH DIFFERENTIAL/PLATELET
Basophils Absolute: 0 10*3/uL (ref 0.0–0.1)
Basophils Relative: 0.3 % (ref 0.0–3.0)
Eosinophils Absolute: 0.2 10*3/uL (ref 0.0–0.7)
Eosinophils Relative: 2.5 % (ref 0.0–5.0)
HCT: 29.5 % — ABNORMAL LOW (ref 39.0–52.0)
Hemoglobin: 10.3 g/dL — ABNORMAL LOW (ref 13.0–17.0)
Lymphocytes Relative: 13.4 % (ref 12.0–46.0)
Lymphs Abs: 1.2 10*3/uL (ref 0.7–4.0)
MCHC: 35 g/dL (ref 30.0–36.0)
MCV: 85.7 fl (ref 78.0–100.0)
Monocytes Absolute: 0.5 10*3/uL (ref 0.1–1.0)
Monocytes Relative: 5.9 % (ref 3.0–12.0)
Neutro Abs: 6.9 10*3/uL (ref 1.4–7.7)
Neutrophils Relative %: 77.9 % — ABNORMAL HIGH (ref 43.0–77.0)
Platelets: 225 10*3/uL (ref 150.0–400.0)
RBC: 3.44 Mil/uL — ABNORMAL LOW (ref 4.22–5.81)
RDW: 15.4 % (ref 11.5–15.5)
WBC: 8.9 10*3/uL (ref 4.0–10.5)

## 2020-12-13 LAB — RENAL FUNCTION PANEL
Albumin: 4.1 g/dL (ref 3.5–5.2)
BUN: 25 mg/dL — ABNORMAL HIGH (ref 6–23)
CO2: 26 mEq/L (ref 19–32)
Calcium: 9.2 mg/dL (ref 8.4–10.5)
Chloride: 103 mEq/L (ref 96–112)
Creatinine, Ser: 1.38 mg/dL (ref 0.40–1.50)
GFR: 44.99 mL/min — ABNORMAL LOW (ref >60.00)
Glucose, Bld: 160 mg/dL — ABNORMAL HIGH (ref 70–99)
Phosphorus: 2.8 mg/dL (ref 2.3–4.6)
Potassium: 4.3 mEq/L (ref 3.5–5.1)
Sodium: 135 mEq/L (ref 135–145)

## 2020-12-13 MED ORDER — DOCUSATE SODIUM 100 MG PO CAPS: 100.0000 mg | ORAL_CAPSULE | Freq: Every day | ORAL | Status: DC

## 2020-12-13 MED ORDER — POLYETHYLENE GLYCOL 3350 17 GM/SCOOP PO POWD
8.5000 g | Freq: Every day | ORAL | 0 refills | Status: DC | PRN
Start: 2020-12-13 — End: 2022-12-13

## 2020-12-13 NOTE — Assessment & Plan Note (Signed)
Seems euvolemic. Continue current regimen including lasix QOD. Possible cause of hyponatremia. Update levels.

## 2020-12-13 NOTE — Assessment & Plan Note (Signed)
Discussed colace, water, fiber in diet, and PRN miralax (sent to pharmacy)

## 2020-12-13 NOTE — Progress Notes (Signed)
Patient ID: ASHELY JOSHUA, male    DOB: [birth year], 87 y.o.   MRN: [id number]  This visit was conducted in person.  BP 138/62   Pulse 68   Temp 97.7 F (36.5 C) (Temporal)   Ht 5' 9.5" (1.765 m)   Wt 182 lb 4 oz (82.7 kg)   SpO2 97%   BMI 26.53 kg/m    CC: 2 wk f/u visit  Subjective:   HPI: Shawn Andrews is a 87 y.o. male presenting on 12/13/2020 for Follow-up (Here for 2 wk f/u and labs.)   Seen by cardiology Dr Nehemiah Massed last week for HTN and chronic systolic CHF. S/p MI in 09/2020.  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   Recent dx COVID first day of symptoms was 12/05/2020. Seen virtually last week, treated with molnupiravir. Symptoms largely improved. He's been COVID vaccinated x4.   Here for follow up of kidney function as well as new hyponatremia.  He takes lasix 20mg  every other day for sCHF. Denies leg swelling, weight gain, dyspnea, chest pain.   He's started drinking more water since sodium level returned low.  10 lb weight loss in the past 3 months - he attributes to low sodium diet.  He doesn't check weights at home.   Notes increase in constipation since MI. Also notes polyuria without accidents. Nocturia x5. He's been taking colace daily with benefit.      Relevant past medical, surgical, family and social history reviewed and updated as indicated. Interim medical history since our last visit reviewed. Allergies and medications reviewed and updated. Outpatient Medications Prior to Visit  Medication Sig Dispense Refill   acetaminophen (TYLENOL 8 HOUR) 650 MG CR tablet Take 1 tablet (650 mg total) by mouth 2 (two) times a day.     aspirin (ASPIRIN 81) 81 MG EC tablet Take 1 tablet (81 mg total) by mouth every Monday, Wednesday, and Friday.     atorvastatin (LIPITOR) 40 MG tablet Take 1 tablet by mouth at bedtime.     Cholecalciferol (VITAMIN D3) 25 MCG (1000 UT) CAPS Take 1 capsule (1,000 Units total) by mouth daily. 30 capsule     clindamycin (CLEOCIN) 150 MG capsule Take 4 capsules (600 mg total) by mouth as directed. Prior to dental procedures     clopidogrel (PLAVIX) 75 MG tablet Take 1 tablet by mouth daily.     doxazosin (CARDURA) 4 MG tablet TAKE 1 TABLET BY MOUTH  DAILY 90 tablet 3   famotidine (PEPCID) 20 MG tablet Take 1 tablet (20 mg total) by mouth at bedtime. 90 tablet 1   finasteride (PROSCAR) 5 MG tablet TAKE 1 TABLET BY MOUTH  DAILY 90 tablet 3   furosemide (LASIX) 20 MG tablet Take 1 tablet by mouth every other day.     isosorbide mononitrate (IMDUR) 30 MG 24 hr tablet Take 1 tablet by mouth daily.     loratadine (CLARITIN) 10 MG tablet Take 1 tablet (10 mg total) by mouth daily as needed for allergies. 30 tablet    metoprolol tartrate (LOPRESSOR) 50 MG tablet Take 0.5 tablets (25 mg total) by mouth 2 (two) times daily.     molnupiravir EUA (LAGEVRIO) 200 mg CAPS capsule Take 4 capsules (800 mg total) by mouth 2 (two) times daily for 5 days. 40 capsule 0   nitroGLYCERIN (NITROSTAT) 0.4 MG SL tablet Place under the tongue.     primidone (MYSOLINE) 50 MG tablet Take 2 tablets (  100 mg total) by mouth 3 (three) times daily.     rOPINIRole (REQUIP) 0.5 MG tablet Take 1 tablet (0.5 mg total) by mouth in the morning and at bedtime.     vitamin B-12 (CYANOCOBALAMIN) 500 MCG tablet Take 1 tablet (500 mcg total) by mouth once a week.     losartan (COZAAR) 25 MG tablet TAKE 1 TABLET BY MOUTH  DAILY 90 tablet 3   losartan (COZAAR) 50 MG tablet Take 1 tablet (50 mg total) by mouth daily.     No facility-administered medications prior to visit.     Per HPI unless specifically indicated in ROS section below Review of Systems  Objective:  BP 138/62   Pulse 68   Temp 97.7 F (36.5 C) (Temporal)   Ht 5' 9.5" (1.765 m)   Wt 182 lb 4 oz (82.7 kg)   SpO2 97%   BMI 26.53 kg/m   Wt Readings from Last 3 Encounters:  12/13/20 182 lb 4 oz (82.7 kg)  12/08/20 184 lb (83.5 kg)  10/16/20 193 lb (87.5 kg)       Physical Exam Vitals and nursing note reviewed.  Constitutional:      Appearance: Normal appearance. He is not ill-appearing.  Cardiovascular:     Rate and Rhythm: Normal rate and regular rhythm.     Pulses: Normal pulses.     Heart sounds: Normal heart sounds. No murmur heard. Pulmonary:     Effort: Pulmonary effort is normal. No respiratory distress.     Breath sounds: Normal breath sounds. No wheezing, rhonchi or rales.  Abdominal:     General: Bowel sounds are normal. There is no distension.     Palpations: Abdomen is soft. There is no mass.     Tenderness: There is no abdominal tenderness. There is no guarding or rebound.     Hernia: No hernia is present.  Musculoskeletal:     Right lower leg: No edema.     Left lower leg: No edema.  Skin:    General: Skin is warm and dry.     Findings: No rash.  Neurological:     Mental Status: He is alert.  Psychiatric:        Mood and Affect: Mood normal.        Behavior: Behavior normal.      Results for orders placed or performed in visit on 11/29/20  Renal function panel  Result Value Ref Range   Sodium 128 (L) 135 - 145 mEq/L   Potassium 4.4 3.5 - 5.1 mEq/L   Chloride 96 96 - 112 mEq/L   CO2 26 19 - 32 mEq/L   Albumin 3.8 3.5 - 5.2 g/dL   BUN 23 6 - 23 mg/dL   Creatinine, Ser 1.51 (H) 0.40 - 1.50 mg/dL   Glucose, Bld 108 (H) 70 - 99 mg/dL   Phosphorus 3.5 2.3 - 4.6 mg/dL   GFR 40.39 (L) >60.00 mL/min   Calcium 8.3 (L) 8.4 - 10.5 mg/dL    Assessment & Plan:  This visit occurred during the SARS-CoV-2 public health emergency.  Safety protocols were in place, including screening questions prior to the visit, additional usage of staff PPE, and extensive cleaning of exam room while observing appropriate contact time as indicated for disinfecting solutions.   Problem List Items Addressed This Visit     CKD (chronic kidney disease) stage 3, GFR 30-59 ml/min (HCC) - Primary    Update labs today. Overall asxs.  Relevant  Orders   Renal function panel   CBC with Differential/Platelet   Parathyroid hormone, intact (no Ca)   Serum protein electrophoresis with reflex   HFrEF (heart failure with reduced ejection fraction) (HCC)    Seems euvolemic. Continue current regimen including lasix QOD. Possible cause of hyponatremia. Update levels.       Relevant Medications   losartan (COZAAR) 50 MG tablet   COVID-19 virus infection    Symptoms largely improved s/p molnupiravir course.       Constipation    Discussed colace, water, fiber in diet, and PRN miralax (sent to pharmacy)       Hyponatremia    Not on thiazide.  Update labs including Uosm.  ?CHF related, seems euvolemic.       Relevant Orders   Osmolality, urine     Meds ordered this encounter  Medications   docusate sodium (COLACE) 100 MG capsule    Sig: Take 1 capsule (100 mg total) by mouth daily.   polyethylene glycol powder (GLYCOLAX/MIRALAX) 17 GM/SCOOP powder    Sig: Take 8.5-17 g by mouth daily as needed for moderate constipation.    Dispense:  3350 g    Refill:  0   Orders Placed This Encounter  Procedures   Renal function panel   CBC with Differential/Platelet   Parathyroid hormone, intact (no Ca)   Serum protein electrophoresis with reflex   Osmolality, urine     Patient Instructions  For constipation - continue good water intake and increase fiber in the diet. Continue colace daily which should help. May add miralax 1/2-1 capful daily as needed.  Labs today - we will be in touch with results.  I'm glad you're feeling better from [place].  Happy Thanksgiving!  Follow up plan: Return if symptoms worsen or fail to improve.  Ria Bush, MD

## 2020-12-13 NOTE — Assessment & Plan Note (Signed)
Symptoms largely improved s/p molnupiravir course.

## 2020-12-13 NOTE — Assessment & Plan Note (Signed)
Not on thiazide.  Update labs including Uosm.  ?CHF related, seems euvolemic.

## 2020-12-13 NOTE — Patient Instructions (Addendum)
For constipation - continue good water intake and increase fiber in the diet. Continue colace daily which should help. May add miralax 1/2-1 capful daily as needed.  Labs today - we will be in touch with results.  I'm glad you're feeling better from [place].  Happy Thanksgiving!

## 2020-12-13 NOTE — Assessment & Plan Note (Signed)
Update labs today. Overall asxs.

## 2020-12-14 LAB — PARATHYROID HORMONE, INTACT (NO CA): PTH: 79 pg/mL — ABNORMAL HIGH (ref 16–77)

## 2020-12-15 ENCOUNTER — Encounter: Payer: Self-pay | Admitting: Family Medicine

## 2020-12-15 DIAGNOSIS — R3915 Urgency of urination: Secondary | ICD-10-CM

## 2020-12-16 LAB — OSMOLALITY, URINE: Osmolality, Ur: 370 mOsm/kg (ref 50–1200)

## 2020-12-20 LAB — PROTEIN ELECTROPHORESIS, SERUM, WITH REFLEX
Albumin ELP: 3.8 g/dL (ref 3.8–4.8)
Alpha 1: 0.4 g/dL — ABNORMAL HIGH (ref 0.2–0.3)
Alpha 2: 0.9 g/dL (ref 0.5–0.9)
Beta 2: 0.3 g/dL (ref 0.2–0.5)
Beta Globulin: 0.3 g/dL — ABNORMAL LOW (ref 0.4–0.6)
Gamma Globulin: 0.4 g/dL — ABNORMAL LOW (ref 0.8–1.7)
Total Protein: 6 g/dL — ABNORMAL LOW (ref 6.1–8.1)

## 2020-12-20 LAB — IFE INTERPRETATION: Immunofix Electr Int: NOT DETECTED

## 2020-12-21 ENCOUNTER — Telehealth: Payer: Self-pay | Admitting: Family Medicine

## 2020-12-21 MED ORDER — CO Q-10 100 MG PO CAPS: 1.0000 | ORAL_CAPSULE | Freq: Every day | ORAL | 0 refills | Status: DC

## 2020-12-21 NOTE — Telephone Encounter (Signed)
Plz schedule lab visit for urine collection at [facility] ordered UA to send out.

## 2020-12-21 NOTE — Telephone Encounter (Signed)
Pt scheduled a lab appointment @ [facility] on 12.5.22

## 2020-12-22 ENCOUNTER — Other Ambulatory Visit: Payer: Medicare Other

## 2020-12-22 ENCOUNTER — Encounter: Payer: Self-pay | Admitting: Family Medicine

## 2020-12-22 MED ORDER — NITROFURANTOIN MONOHYD MACRO 100 MG PO CAPS: 100.0000 mg | ORAL_CAPSULE | Freq: Two times a day (BID) | ORAL | 0 refills | Status: DC

## 2020-12-22 NOTE — Telephone Encounter (Signed)
Pt dropped off a urine specimen in the drop box at the [place] office.  This specimen was collected in a glass jar with a rusty lid & will be rejected.  Please contact patient to have someone pick up a sterile specimen container for recollection.

## 2020-12-22 NOTE — Telephone Encounter (Signed)
Spoke with patient.  Ongoing stinging and burning for months associated with polyuria and nocturia. Noted some trouble voiding in evenings for 2 wks.  He will collect urine specimen in sterile container when he goes for labs on Monday 4pm to [facility].  If worsening symptoms ie fever, urinary retention, flank pain or nausea/vomiting, advised seek urgent care over weekend.

## 2020-12-22 NOTE — Telephone Encounter (Signed)
Spoke with pt scheduling lab visit at 3:15 at [facility].  Pt is currently enroute to [facility].

## 2020-12-25 ENCOUNTER — Other Ambulatory Visit: Payer: Self-pay

## 2020-12-25 ENCOUNTER — Other Ambulatory Visit (INDEPENDENT_AMBULATORY_CARE_PROVIDER_SITE_OTHER): Payer: Medicare Other

## 2020-12-25 DIAGNOSIS — R3915 Urgency of urination: Secondary | ICD-10-CM

## 2020-12-26 ENCOUNTER — Encounter: Payer: Self-pay | Admitting: Family Medicine

## 2020-12-26 ENCOUNTER — Telehealth: Payer: Self-pay | Admitting: Family Medicine

## 2020-12-26 ENCOUNTER — Other Ambulatory Visit: Payer: Self-pay | Admitting: Family Medicine

## 2020-12-26 DIAGNOSIS — R3915 Urgency of urination: Secondary | ICD-10-CM

## 2020-12-26 LAB — URINALYSIS WITH CULTURE, IF INDICATED
Bilirubin Urine: NEGATIVE
Ketones, ur: NEGATIVE
Nitrite: NEGATIVE
Specific Gravity, Urine: 1.01 (ref 1.000–1.030)
Total Protein, Urine: 30 — AB
Urine Glucose: NEGATIVE
Urobilinogen, UA: 0.2 (ref 0.0–1.0)
pH: 5.5 (ref 5.0–8.0)

## 2020-12-26 MED ORDER — CIPROFLOXACIN HCL 250 MG PO TABS: 250.0000 mg | ORAL_TABLET | Freq: Two times a day (BID) | ORAL | 0 refills | Status: AC

## 2020-12-26 NOTE — Telephone Encounter (Signed)
Lvm asking pt to call back.  We have not received results yet.  As soon as Dr. Darnell Level receives/reviews results pt will be notified.

## 2020-12-26 NOTE — Telephone Encounter (Signed)
Pt wife called to follow up om labs from yesterday

## 2020-12-26 NOTE — Progress Notes (Signed)
UA abnormal suspicious for infection. See result note. Start cipro 250mg  bid x 7 days

## 2020-12-26 NOTE — Telephone Encounter (Signed)
Pt/pt's wife, Shawn Andrews rtn call.  I relayed info below about labs.  Pt verbalizes understanding and will watch for Dr. Synthia Innocent review of results.

## 2020-12-27 ENCOUNTER — Other Ambulatory Visit: Payer: Medicare Other

## 2020-12-27 ENCOUNTER — Telehealth: Payer: Self-pay | Admitting: Family Medicine

## 2020-12-27 DIAGNOSIS — R3915 Urgency of urination: Secondary | ICD-10-CM

## 2020-12-27 NOTE — Addendum Note (Signed)
Addended by: Brenton Grills on: 46/05/352 65:68 AM   Modules accepted: Orders

## 2020-12-27 NOTE — Progress Notes (Signed)
Thank you :)

## 2020-12-28 LAB — URINE CULTURE
MICRO NUMBER:: 12726101
Result:: NO GROWTH
SPECIMEN QUALITY:: ADEQUATE

## 2021-01-01 ENCOUNTER — Encounter: Payer: Self-pay | Admitting: Family Medicine

## 2021-01-03 ENCOUNTER — Encounter: Payer: Self-pay | Admitting: Family Medicine

## 2021-01-03 ENCOUNTER — Telehealth: Payer: Self-pay | Admitting: Family Medicine

## 2021-01-03 MED ORDER — CIPROFLOXACIN HCL 250 MG PO TABS: 250.0000 mg | ORAL_TABLET | Freq: Two times a day (BID) | ORAL | 0 refills | Status: AC

## 2021-01-03 NOTE — Telephone Encounter (Signed)
Returned pt's wife's, [name], call.  She wants to make Dr. Darnell Level know pt has finished abx and is better but is still having some sxs (see Pt Msg, 01/03/21).

## 2021-01-03 NOTE — Telephone Encounter (Signed)
Treating as prostatitis for total 14d course.

## 2021-01-03 NOTE — Telephone Encounter (Signed)
Pts wife [name] called to speak with Lattie Haw about Mr Demetrius's antibiotic.

## 2021-01-03 NOTE — Telephone Encounter (Signed)
See other mychart message.

## 2021-01-03 NOTE — Telephone Encounter (Signed)
See latest mychart message.  Treating with another week of cipro to complete 2 wks of cipro to treat possible prostatitis.

## 2021-01-05 NOTE — Telephone Encounter (Signed)
error 

## 2021-01-16 ENCOUNTER — Emergency Department: Payer: Medicare Other

## 2021-01-16 ENCOUNTER — Other Ambulatory Visit: Payer: Self-pay

## 2021-01-16 DIAGNOSIS — J90 Pleural effusion, not elsewhere classified: Secondary | ICD-10-CM | POA: Diagnosis not present

## 2021-01-16 DIAGNOSIS — I214 Non-ST elevation (NSTEMI) myocardial infarction: Secondary | ICD-10-CM | POA: Diagnosis not present

## 2021-01-16 DIAGNOSIS — J9811 Atelectasis: Secondary | ICD-10-CM | POA: Diagnosis not present

## 2021-01-16 DIAGNOSIS — I25118 Atherosclerotic heart disease of native coronary artery with other forms of angina pectoris: Secondary | ICD-10-CM | POA: Diagnosis not present

## 2021-01-16 DIAGNOSIS — N1831 Chronic kidney disease, stage 3a: Secondary | ICD-10-CM | POA: Diagnosis present

## 2021-01-16 DIAGNOSIS — Z86718 Personal history of other venous thrombosis and embolism: Secondary | ICD-10-CM

## 2021-01-16 DIAGNOSIS — I110 Hypertensive heart disease with heart failure: Secondary | ICD-10-CM | POA: Diagnosis not present

## 2021-01-16 DIAGNOSIS — Z888 Allergy status to other drugs, medicaments and biological substances status: Secondary | ICD-10-CM | POA: Diagnosis not present

## 2021-01-16 DIAGNOSIS — E871 Hypo-osmolality and hyponatremia: Secondary | ICD-10-CM | POA: Diagnosis not present

## 2021-01-16 DIAGNOSIS — Z7982 Long term (current) use of aspirin: Secondary | ICD-10-CM

## 2021-01-16 DIAGNOSIS — K219 Gastro-esophageal reflux disease without esophagitis: Secondary | ICD-10-CM | POA: Diagnosis not present

## 2021-01-16 DIAGNOSIS — Z880 Allergy status to penicillin: Secondary | ICD-10-CM

## 2021-01-16 DIAGNOSIS — R319 Hematuria, unspecified: Secondary | ICD-10-CM | POA: Diagnosis present

## 2021-01-16 DIAGNOSIS — Z79899 Other long term (current) drug therapy: Secondary | ICD-10-CM | POA: Diagnosis not present

## 2021-01-16 DIAGNOSIS — I130 Hypertensive heart and chronic kidney disease with heart failure and stage 1 through stage 4 chronic kidney disease, or unspecified chronic kidney disease: Secondary | ICD-10-CM | POA: Diagnosis not present

## 2021-01-16 DIAGNOSIS — Z7902 Long term (current) use of antithrombotics/antiplatelets: Secondary | ICD-10-CM

## 2021-01-16 DIAGNOSIS — I509 Heart failure, unspecified: Secondary | ICD-10-CM | POA: Diagnosis not present

## 2021-01-16 DIAGNOSIS — Z8616 Personal history of COVID-19: Secondary | ICD-10-CM | POA: Diagnosis not present

## 2021-01-16 DIAGNOSIS — G250 Essential tremor: Secondary | ICD-10-CM | POA: Diagnosis present

## 2021-01-16 DIAGNOSIS — E785 Hyperlipidemia, unspecified: Secondary | ICD-10-CM | POA: Diagnosis present

## 2021-01-16 DIAGNOSIS — I700 Atherosclerosis of aorta: Secondary | ICD-10-CM | POA: Diagnosis not present

## 2021-01-16 DIAGNOSIS — Z96651 Presence of right artificial knee joint: Secondary | ICD-10-CM | POA: Diagnosis not present

## 2021-01-16 DIAGNOSIS — Z66 Do not resuscitate: Secondary | ICD-10-CM | POA: Diagnosis present

## 2021-01-16 DIAGNOSIS — I5023 Acute on chronic systolic (congestive) heart failure: Secondary | ICD-10-CM | POA: Diagnosis present

## 2021-01-16 DIAGNOSIS — R079 Chest pain, unspecified: Secondary | ICD-10-CM | POA: Diagnosis not present

## 2021-01-16 DIAGNOSIS — F17220 Nicotine dependence, chewing tobacco, uncomplicated: Secondary | ICD-10-CM | POA: Diagnosis present

## 2021-01-16 DIAGNOSIS — Z20822 Contact with and (suspected) exposure to covid-19: Secondary | ICD-10-CM | POA: Diagnosis present

## 2021-01-16 DIAGNOSIS — N400 Enlarged prostate without lower urinary tract symptoms: Secondary | ICD-10-CM | POA: Diagnosis present

## 2021-01-16 DIAGNOSIS — R778 Other specified abnormalities of plasma proteins: Secondary | ICD-10-CM | POA: Diagnosis not present

## 2021-01-16 LAB — CBC
HCT: 31 % — ABNORMAL LOW (ref 39.0–52.0)
Hemoglobin: 10.4 g/dL — ABNORMAL LOW (ref 13.0–17.0)
MCH: 29.3 pg (ref 26.0–34.0)
MCHC: 33.5 g/dL (ref 30.0–36.0)
MCV: 87.3 fL (ref 80.0–100.0)
Platelets: 322 10*3/uL (ref 150–400)
RBC: 3.55 MIL/uL — ABNORMAL LOW (ref 4.22–5.81)
RDW: 14.4 % (ref 11.5–15.5)
WBC: 6.7 10*3/uL (ref 4.0–10.5)
nRBC: 0 % (ref 0.0–0.2)

## 2021-01-16 LAB — BASIC METABOLIC PANEL
Anion gap: 6 (ref 5–15)
BUN: 25 mg/dL — ABNORMAL HIGH (ref 8–23)
CO2: 26 mmol/L (ref 22–32)
Calcium: 8.7 mg/dL — ABNORMAL LOW (ref 8.9–10.3)
Chloride: 100 mmol/L (ref 98–111)
Creatinine, Ser: 1.24 mg/dL (ref 0.61–1.24)
GFR, Estimated: 55 mL/min — ABNORMAL LOW (ref >60)
Glucose, Bld: 120 mg/dL — ABNORMAL HIGH (ref 70–99)
Potassium: 4.6 mmol/L (ref 3.5–5.1)
Sodium: 132 mmol/L — ABNORMAL LOW (ref 135–145)

## 2021-01-16 LAB — TROPONIN I (HIGH SENSITIVITY): Troponin I (High Sensitivity): 65 ng/L — ABNORMAL HIGH (ref <18)

## 2021-01-16 NOTE — ED Triage Notes (Signed)
Pt to ED for chest tightness for the past couple weeks. States chest tightness worsened with activity today. Also reports shob that started at the same time.  Had MI with stents placed in sept

## 2021-01-17 ENCOUNTER — Inpatient Hospital Stay
Admission: EM | Admit: 2021-01-17 | Discharge: 2021-01-19 | DRG: 280 | Disposition: A | Discharge status: Home / Self Care | Payer: Medicare Other | Attending: Family Medicine | Admitting: Family Medicine

## 2021-01-17 ENCOUNTER — Emergency Department: Payer: Medicare Other

## 2021-01-17 DIAGNOSIS — I5023 Acute on chronic systolic (congestive) heart failure: Secondary | ICD-10-CM | POA: Diagnosis present

## 2021-01-17 DIAGNOSIS — Z880 Allergy status to penicillin: Secondary | ICD-10-CM | POA: Diagnosis not present

## 2021-01-17 DIAGNOSIS — R778 Other specified abnormalities of plasma proteins: Secondary | ICD-10-CM | POA: Diagnosis not present

## 2021-01-17 DIAGNOSIS — Z66 Do not resuscitate: Secondary | ICD-10-CM | POA: Diagnosis present

## 2021-01-17 DIAGNOSIS — I509 Heart failure, unspecified: Secondary | ICD-10-CM | POA: Diagnosis not present

## 2021-01-17 DIAGNOSIS — E785 Hyperlipidemia, unspecified: Secondary | ICD-10-CM | POA: Diagnosis not present

## 2021-01-17 DIAGNOSIS — K219 Gastro-esophageal reflux disease without esophagitis: Secondary | ICD-10-CM | POA: Diagnosis present

## 2021-01-17 DIAGNOSIS — I214 Non-ST elevation (NSTEMI) myocardial infarction: Secondary | ICD-10-CM | POA: Diagnosis not present

## 2021-01-17 DIAGNOSIS — Z8616 Personal history of COVID-19: Secondary | ICD-10-CM | POA: Diagnosis not present

## 2021-01-17 DIAGNOSIS — R319 Hematuria, unspecified: Secondary | ICD-10-CM | POA: Diagnosis present

## 2021-01-17 DIAGNOSIS — I10 Essential (primary) hypertension: Secondary | ICD-10-CM | POA: Diagnosis present

## 2021-01-17 DIAGNOSIS — Z7982 Long term (current) use of aspirin: Secondary | ICD-10-CM | POA: Diagnosis not present

## 2021-01-17 DIAGNOSIS — I25118 Atherosclerotic heart disease of native coronary artery with other forms of angina pectoris: Secondary | ICD-10-CM | POA: Diagnosis present

## 2021-01-17 DIAGNOSIS — N1831 Chronic kidney disease, stage 3a: Secondary | ICD-10-CM | POA: Diagnosis not present

## 2021-01-17 DIAGNOSIS — N183 Chronic kidney disease, stage 3 unspecified: Secondary | ICD-10-CM | POA: Diagnosis not present

## 2021-01-17 DIAGNOSIS — Z20822 Contact with and (suspected) exposure to covid-19: Secondary | ICD-10-CM | POA: Diagnosis present

## 2021-01-17 DIAGNOSIS — I5022 Chronic systolic (congestive) heart failure: Secondary | ICD-10-CM | POA: Diagnosis not present

## 2021-01-17 DIAGNOSIS — J90 Pleural effusion, not elsewhere classified: Secondary | ICD-10-CM | POA: Diagnosis not present

## 2021-01-17 DIAGNOSIS — Z888 Allergy status to other drugs, medicaments and biological substances status: Secondary | ICD-10-CM | POA: Diagnosis not present

## 2021-01-17 DIAGNOSIS — Z7902 Long term (current) use of antithrombotics/antiplatelets: Secondary | ICD-10-CM | POA: Diagnosis not present

## 2021-01-17 DIAGNOSIS — Z86718 Personal history of other venous thrombosis and embolism: Secondary | ICD-10-CM | POA: Diagnosis not present

## 2021-01-17 DIAGNOSIS — E871 Hypo-osmolality and hyponatremia: Secondary | ICD-10-CM | POA: Diagnosis present

## 2021-01-17 DIAGNOSIS — N400 Enlarged prostate without lower urinary tract symptoms: Secondary | ICD-10-CM | POA: Diagnosis not present

## 2021-01-17 DIAGNOSIS — I200 Unstable angina: Secondary | ICD-10-CM | POA: Diagnosis not present

## 2021-01-17 DIAGNOSIS — Z79899 Other long term (current) drug therapy: Secondary | ICD-10-CM | POA: Diagnosis not present

## 2021-01-17 DIAGNOSIS — G250 Essential tremor: Secondary | ICD-10-CM | POA: Diagnosis not present

## 2021-01-17 DIAGNOSIS — Z96651 Presence of right artificial knee joint: Secondary | ICD-10-CM | POA: Diagnosis present

## 2021-01-17 DIAGNOSIS — I700 Atherosclerosis of aorta: Secondary | ICD-10-CM | POA: Diagnosis not present

## 2021-01-17 DIAGNOSIS — I110 Hypertensive heart disease with heart failure: Secondary | ICD-10-CM | POA: Diagnosis not present

## 2021-01-17 DIAGNOSIS — R0789 Other chest pain: Secondary | ICD-10-CM

## 2021-01-17 DIAGNOSIS — I130 Hypertensive heart and chronic kidney disease with heart failure and stage 1 through stage 4 chronic kidney disease, or unspecified chronic kidney disease: Secondary | ICD-10-CM | POA: Diagnosis present

## 2021-01-17 DIAGNOSIS — J9811 Atelectasis: Secondary | ICD-10-CM | POA: Diagnosis not present

## 2021-01-17 DIAGNOSIS — I2510 Atherosclerotic heart disease of native coronary artery without angina pectoris: Secondary | ICD-10-CM | POA: Diagnosis present

## 2021-01-17 DIAGNOSIS — F17220 Nicotine dependence, chewing tobacco, uncomplicated: Secondary | ICD-10-CM | POA: Diagnosis present

## 2021-01-17 LAB — TROPONIN I (HIGH SENSITIVITY)
Troponin I (High Sensitivity): 81 ng/L — ABNORMAL HIGH (ref <18)
Troponin I (High Sensitivity): 146 ng/L (ref <18)
Troponin I (High Sensitivity): 162 ng/L (ref <18)

## 2021-01-17 LAB — PROCALCITONIN: Procalcitonin: <0.1 ng/mL

## 2021-01-17 LAB — PROTIME-INR
INR: 1.1 (ref 0.8–1.2)
Prothrombin Time: 14.6 seconds (ref 11.4–15.2)

## 2021-01-17 LAB — HEMOGLOBIN A1C
Hgb A1c MFr Bld: 5 % (ref 4.8–5.6)
Mean Plasma Glucose: 96.8 mg/dL

## 2021-01-17 LAB — RESP PANEL BY RT-PCR (FLU A&B, COVID) ARPGX2
Influenza A by PCR: NEGATIVE
Influenza B by PCR: NEGATIVE
SARS Coronavirus 2 by RT PCR: NEGATIVE

## 2021-01-17 LAB — HEPARIN LEVEL (UNFRACTIONATED)
Heparin Unfractionated: 0.13 IU/mL — ABNORMAL LOW (ref 0.30–0.70)
Heparin Unfractionated: 0.34 IU/mL (ref 0.30–0.70)

## 2021-01-17 LAB — APTT: aPTT: 31 seconds (ref 24–36)

## 2021-01-17 LAB — BRAIN NATRIURETIC PEPTIDE: B Natriuretic Peptide: 793 pg/mL — ABNORMAL HIGH (ref 0.0–100.0)

## 2021-01-17 LAB — GLUCOSE, CAPILLARY: Glucose-Capillary: 125 mg/dL — ABNORMAL HIGH (ref 70–99)

## 2021-01-17 MED ORDER — ISOSORBIDE MONONITRATE ER 30 MG PO TB24
30.0000 mg | ORAL_TABLET | Freq: Every day | ORAL | Status: DC
  Administered 2021-01-17 – 2021-01-19 (×3): 30 mg via ORAL
  Filled 2021-01-17 (×3): qty 1

## 2021-01-17 MED ORDER — NITROGLYCERIN 0.4 MG SL SUBL: 0.4000 mg | SUBLINGUAL_TABLET | SUBLINGUAL | Status: DC | PRN

## 2021-01-17 MED ORDER — FUROSEMIDE 10 MG/ML IJ SOLN
40.0000 mg | Freq: Once | INTRAMUSCULAR | Status: AC
  Administered 2021-01-17: 04:00:00 40 mg via INTRAVENOUS
  Filled 2021-01-17: qty 4

## 2021-01-17 MED ORDER — HEPARIN (PORCINE) 25000 UT/250ML-% IV SOLN
1400.0000 [IU]/h | INTRAVENOUS | Status: DC
  Administered 2021-01-17: 05:00:00 1100 [IU]/h via INTRAVENOUS
  Administered 2021-01-17: 20:00:00 1400 [IU]/h via INTRAVENOUS
  Filled 2021-01-17 (×2): qty 250

## 2021-01-17 MED ORDER — CO Q-10 100 MG PO CAPS: 1.0000 | ORAL_CAPSULE | Freq: Every day | ORAL | Status: DC

## 2021-01-17 MED ORDER — CYANOCOBALAMIN 500 MCG PO TABS: 500.0000 ug | ORAL_TABLET | ORAL | Status: DC

## 2021-01-17 MED ORDER — LOSARTAN POTASSIUM 50 MG PO TABS
50.0000 mg | ORAL_TABLET | Freq: Every day | ORAL | Status: DC
  Administered 2021-01-17 – 2021-01-19 (×3): 50 mg via ORAL
  Filled 2021-01-17 (×3): qty 1

## 2021-01-17 MED ORDER — ASPIRIN 81 MG PO CHEW
324.0000 mg | CHEWABLE_TABLET | Freq: Once | ORAL | Status: AC
  Administered 2021-01-17: 03:00:00 324 mg via ORAL
  Filled 2021-01-17: qty 4

## 2021-01-17 MED ORDER — POLYETHYLENE GLYCOL 3350 17 G PO PACK: 8.5000 g | PACK | Freq: Every day | ORAL | Status: DC | PRN

## 2021-01-17 MED ORDER — DM-GUAIFENESIN ER 30-600 MG PO TB12: 1.0000 | ORAL_TABLET | Freq: Two times a day (BID) | ORAL | Status: DC | PRN

## 2021-01-17 MED ORDER — VITAMIN D 25 MCG (1000 UNIT) PO TABS
1000.0000 [IU] | ORAL_TABLET | Freq: Every day | ORAL | Status: DC
  Administered 2021-01-17 – 2021-01-19 (×2): 1000 [IU] via ORAL
  Filled 2021-01-17 (×3): qty 1

## 2021-01-17 MED ORDER — PRIMIDONE 50 MG PO TABS
100.0000 mg | ORAL_TABLET | Freq: Three times a day (TID) | ORAL | Status: DC
  Administered 2021-01-17 – 2021-01-19 (×5): 100 mg via ORAL
  Filled 2021-01-17 (×8): qty 2

## 2021-01-17 MED ORDER — LORATADINE 10 MG PO TABS: 10.0000 mg | ORAL_TABLET | Freq: Every day | ORAL | Status: DC | PRN

## 2021-01-17 MED ORDER — CLOPIDOGREL BISULFATE 75 MG PO TABS
75.0000 mg | ORAL_TABLET | Freq: Every day | ORAL | Status: DC
  Administered 2021-01-17 – 2021-01-19 (×3): 75 mg via ORAL
  Filled 2021-01-17 (×3): qty 1

## 2021-01-17 MED ORDER — ONDANSETRON HCL 4 MG/2ML IJ SOLN: 4.0000 mg | Freq: Three times a day (TID) | INTRAMUSCULAR | Status: DC | PRN

## 2021-01-17 MED ORDER — MELATONIN 5 MG PO TABS
10.0000 mg | ORAL_TABLET | Freq: Every day | ORAL | Status: DC
  Administered 2021-01-17 – 2021-01-18 (×2): 10 mg via ORAL
  Filled 2021-01-17 (×2): qty 2

## 2021-01-17 MED ORDER — SODIUM CHLORIDE 0.9 % IV SOLN
1.0000 g | INTRAVENOUS | Status: AC
  Administered 2021-01-17: 04:00:00 1 g via INTRAVENOUS
  Filled 2021-01-17: qty 10

## 2021-01-17 MED ORDER — HEPARIN BOLUS VIA INFUSION
2500.0000 [IU] | Freq: Once | INTRAVENOUS | Status: AC
  Administered 2021-01-17: 14:00:00 2500 [IU] via INTRAVENOUS
  Filled 2021-01-17: qty 2500

## 2021-01-17 MED ORDER — ACETAMINOPHEN 325 MG PO TABS
650.0000 mg | ORAL_TABLET | Freq: Four times a day (QID) | ORAL | Status: DC | PRN
  Administered 2021-01-17 – 2021-01-19 (×2): 650 mg via ORAL
  Filled 2021-01-17 (×2): qty 2

## 2021-01-17 MED ORDER — HYDRALAZINE HCL 20 MG/ML IJ SOLN: 5.0000 mg | INTRAMUSCULAR | Status: DC | PRN

## 2021-01-17 MED ORDER — DOCUSATE SODIUM 100 MG PO CAPS: 100.0000 mg | ORAL_CAPSULE | Freq: Every day | ORAL | Status: DC | PRN

## 2021-01-17 MED ORDER — ASPIRIN EC 81 MG PO TBEC
81.0000 mg | DELAYED_RELEASE_TABLET | ORAL | Status: DC
  Administered 2021-01-17 – 2021-01-19 (×2): 81 mg via ORAL
  Filled 2021-01-17 (×2): qty 1

## 2021-01-17 MED ORDER — METOPROLOL TARTRATE 25 MG PO TABS
25.0000 mg | ORAL_TABLET | Freq: Two times a day (BID) | ORAL | Status: DC
  Administered 2021-01-17 – 2021-01-19 (×5): 25 mg via ORAL
  Filled 2021-01-17 (×5): qty 1

## 2021-01-17 MED ORDER — ALBUTEROL SULFATE (2.5 MG/3ML) 0.083% IN NEBU
3.0000 mL | INHALATION_SOLUTION | RESPIRATORY_TRACT | Status: DC | PRN
  Administered 2021-01-17: 18:00:00 3 mL via RESPIRATORY_TRACT
  Filled 2021-01-17: qty 3

## 2021-01-17 MED ORDER — FINASTERIDE 5 MG PO TABS
5.0000 mg | ORAL_TABLET | Freq: Every day | ORAL | Status: DC
  Administered 2021-01-17 – 2021-01-19 (×3): 5 mg via ORAL
  Filled 2021-01-17 (×4): qty 1

## 2021-01-17 MED ORDER — HEPARIN BOLUS VIA INFUSION
4000.0000 [IU] | Freq: Once | INTRAVENOUS | Status: AC
  Administered 2021-01-17: 05:00:00 4000 [IU] via INTRAVENOUS
  Filled 2021-01-17: qty 4000

## 2021-01-17 MED ORDER — FAMOTIDINE 20 MG PO TABS
20.0000 mg | ORAL_TABLET | Freq: Every day | ORAL | Status: DC
  Administered 2021-01-17 – 2021-01-18 (×2): 20 mg via ORAL
  Filled 2021-01-17 (×2): qty 1

## 2021-01-17 MED ORDER — SODIUM CHLORIDE 0.9 % IV SOLN
500.0000 mg | Freq: Once | INTRAVENOUS | Status: AC
  Administered 2021-01-17: 05:00:00 500 mg via INTRAVENOUS
  Filled 2021-01-17: qty 5

## 2021-01-17 MED ORDER — DOXAZOSIN MESYLATE 4 MG PO TABS
4.0000 mg | ORAL_TABLET | Freq: Every day | ORAL | Status: DC
  Administered 2021-01-17 – 2021-01-19 (×3): 4 mg via ORAL
  Filled 2021-01-17 (×3): qty 1

## 2021-01-17 MED ORDER — FUROSEMIDE 10 MG/ML IJ SOLN
40.0000 mg | Freq: Two times a day (BID) | INTRAMUSCULAR | Status: DC
  Administered 2021-01-17 – 2021-01-18 (×2): 40 mg via INTRAVENOUS
  Filled 2021-01-17 (×2): qty 4

## 2021-01-17 NOTE — ED Notes (Addendum)
MD Damita Dunnings updated on in and out cath results as well as confirmation that foley is not needed. Per Damita Dunnings, MD, "Inpatient criteria :straight cath x 3 then foley, so this is straight cath #1."

## 2021-01-17 NOTE — ED Notes (Signed)
Pt given warm blankets and pillow at this time. Pt denies any further needs at this time

## 2021-01-17 NOTE — H&P (Signed)
History and Physical    Shawn Andrews BZJ:[id number] DOB: 12-11-1928 DOA: 01/17/2021  Referring MD/NP/PA:   PCP: Ria Bush, MD   Patient coming from:  The patient is coming from home.  At baseline, pt is independent for most of ADL.        Chief Complaint: chest pressure and SOB  HPI: Shawn Andrews is a 87 y.o. male with medical history significant of sCHF with EF 40%, hypertension, hyperlipidemia, GERD, CAD, non-STEMI, BPH, CKD stage IIIa, essential tremor, left leg DVT not on anticoagulants, who presents with chest pressure and shortness of breath.  Patient states that he has been having chest pressure and shortness of breath for more than 1 week.  Her chest pressure is located in substernal area, dull, mild to moderate, nonradiating.  She does not have cough, fever or chills.  Her shortness of breath is exertional.  No nausea vomiting, diarrhea or abdominal pain.  No symptoms of UTI.   ED Course: pt was found to have BMP 793, troponin level 65, 81, procalcitonin <0.10, INR 1.1, PTT 31, negative COVID PCR, stable renal function, temperature normal, blood pressure 139/85, heart rate 105, 82, RR 20, oxygen saturation 96% on room air.  Patient is admitted to progressive bed as inpatient.  CT-chest: 1. Moderate left and small right pleural effusions with partial compressive atelectasis of the left lower lobe. 2. Patchy areas of nodular density and consolidative changes involving the left lower lobe and lingula most concerning for pneumonia or aspiration. 3. Aortic Atherosclerosis (ICD10-I70.0).   Review of Systems:   General: no fevers, chills, no body weight gain, has fatigue HEENT: no blurry vision, hearing changes or sore throat Respiratory: has dyspnea, no coughing, wheezing CV: has chest pressure, no palpitations GI: no nausea, vomiting, abdominal pain, diarrhea, constipation GU: no dysuria, burning on urination, increased urinary frequency, hematuria  Ext: has leg  edema Neuro: no unilateral weakness, numbness, or tingling, no vision change or hearing loss Skin: no rash, no skin tear. MSK: No muscle spasm, no deformity, no limitation of range of movement in spin Heme: No easy bruising.  Travel history: No recent long distant travel.  Allergy:  Allergies  Allergen Reactions   Penicillins Swelling    Of tongue Has patient had a PCN reaction causing immediate rash, facial/tongue/throat swelling, SOB or lightheadedness with hypotension: Yes Has patient had a PCN reaction causing severe rash involving mucus membranes or skin necrosis: No Has patient had a PCN reaction that required hospitalization: No Has patient had a PCN reaction occurring within the last 10 years: No If all of the above answers are "NO", then may proceed with Cephalosporin use.   Cialis [Tadalafil] Other (See Comments)    Indigestion and "felt like crap"   Lipitor [Atorvastatin] Other (See Comments)    Severe myalgias   Lisinopril Cough    Dry nagging cough    Past Medical History:  Diagnosis Date   Abnormal drug screen 06/2014   inapprop neg xanax rpt 3 mo (06/2014)   BPH (benign prostatic hypertrophy) 01/21/98   has had 3 biopsies in past ([facility]) decided to stop PSA/DRE   Hyperlipidemia 01/21/02   Hypertension 05/22/03   Left lumbar radiculopathy    Osteoarthritis 01/22/88   knees, lumbar spondylosis and listhesis    Past Surgical History:  Procedure Laterality Date   CATARACT EXTRACTION  2013   bilateral   ESI Left 08/2014, 11/2014, 08/2015   L S1, L L5/S1 transforaminal ESI; L4/5 L5/S1 zygapophysial injections,  L S1 transforaminal ([facility])   FINGER SURGERY     right middle, partial traumatic amputation   KNEE ARTHROSCOPY  1990   right   KNEE SURGERY  04/2006   right partial knee replacement in [place] - rec ppx abx for any invasive procedure    Social History:  reports that he quit smoking about 20 years ago. His smoking use included pipe and cigarettes. His  smokeless tobacco use includes chew. He reports current alcohol use. He reports that he does not use drugs.  Family History:  Family History  Problem Relation Age of Onset   Hypertension Mother    Heart disease Brother 5       MI   Diabetes Brother    Cancer Daughter        melanoma   Stroke Neg Hx      Prior to Admission medications   Medication Sig Start Date End Date Taking? Authorizing Provider  acetaminophen (TYLENOL 8 HOUR) 650 MG CR tablet Take 1 tablet (650 mg total) by mouth 2 (two) times a day. 06/24/18   Ria Bush, MD  aspirin (ASPIRIN 81) 81 MG EC tablet Take 1 tablet (81 mg total) by mouth every Monday, Wednesday, and Friday. 08/30/20   Ria Bush, MD  atorvastatin (LIPITOR) 40 MG tablet Take 1 tablet by mouth at bedtime. Patient not taking: Reported on 01/17/2021 10/09/20   [provider]  Cholecalciferol (VITAMIN D3) 25 MCG (1000 UT) CAPS Take 1 capsule (1,000 Units total) by mouth daily. 08/25/18   Ria Bush, MD  clindamycin (CLEOCIN) 150 MG capsule Take 4 capsules (600 mg total) by mouth as directed. Prior to dental procedures 10/16/20   Ria Bush, MD  clopidogrel (PLAVIX) 75 MG tablet Take 1 tablet by mouth daily. 10/10/20   [provider]  Coenzyme Q10 (CO Q-10) 100 MG CAPS Take 1 capsule by mouth daily. 12/21/20   Ria Bush, MD  docusate sodium (COLACE) 100 MG capsule Take 1 capsule (100 mg total) by mouth daily. 12/13/20   Ria Bush, MD  doxazosin (CARDURA) 4 MG tablet TAKE 1 TABLET BY MOUTH  DAILY 11/17/19   Ria Bush, MD  famotidine (PEPCID) 20 MG tablet Take 1 tablet (20 mg total) by mouth at bedtime. 08/30/20   Ria Bush, MD  finasteride (PROSCAR) 5 MG tablet TAKE 1 TABLET BY MOUTH  DAILY 11/17/19   Ria Bush, MD  furosemide (LASIX) 20 MG tablet Take 1 tablet by mouth every other day. 10/09/20   [provider]  isosorbide mononitrate (IMDUR) 30 MG 24 hr tablet Take 1 tablet  by mouth daily. 10/10/20   [provider]  loratadine (CLARITIN) 10 MG tablet Take 1 tablet (10 mg total) by mouth daily as needed for allergies. 12/22/12   Ria Bush, MD  losartan (COZAAR) 50 MG tablet Take 1 tablet (50 mg total) by mouth daily. 12/13/20   Ria Bush, MD  metoprolol tartrate (LOPRESSOR) 50 MG tablet Take 0.5 tablets (25 mg total) by mouth 2 (two) times daily. 10/16/20   Ria Bush, MD  nitroGLYCERIN (NITROSTAT) 0.4 MG SL tablet Place under the tongue. 10/09/20   [provider]  polyethylene glycol powder (GLYCOLAX/MIRALAX) 17 GM/SCOOP powder Take 8.5-17 g by mouth daily as needed for moderate constipation. 12/13/20   Ria Bush, MD  primidone (MYSOLINE) 50 MG tablet Take 2 tablets (100 mg total) by mouth 3 (three) times daily. 10/16/20   Ria Bush, MD  rOPINIRole (REQUIP) 0.5 MG tablet Take 1 tablet (  0.5 mg total) by mouth in the morning and at bedtime. 08/25/19   Ria Bush, MD  rosuvastatin (CRESTOR) 5 MG tablet Take 5 mg by mouth daily. 12/21/20 12/21/21  [provider]  vitamin B-12 (CYANOCOBALAMIN) 500 MCG tablet Take 1 tablet (500 mcg total) by mouth once a week. 08/30/20   Ria Bush, MD    Physical Exam: Vitals:   01/17/21 1215 01/17/21 1400 01/17/21 1415 01/17/21 1530  BP: (!) 127/103 (!) 140/54  134/73  Pulse: 73 61  93  Resp: 18   18  Temp:      TempSrc:      SpO2: 98% 91% 95% 95%  Weight:      Height:       General: Not in acute distress HEENT:       Eyes: PERRL, EOMI, no scleral icterus.       ENT: No discharge from the ears and nose, no pharynx injection, no tonsillar enlargement.        Neck: Has positive JVD, no bruit, no mass felt. Heme: No neck lymph node enlargement. Cardiac: S1/S2, RRR, No murmurs, No gallops or rubs. Respiratory: Has crackles bilaterally GI: Soft, nondistended, nontender, no rebound pain, no organomegaly, BS present. GU: No hematuria Ext: Has 1+ pitting leg  edema bilaterally. 1+DP/PT pulse bilaterally. Musculoskeletal: No joint deformities, No joint redness or warmth, no limitation of ROM in spin. Skin: No rashes.  Neuro: Alert, oriented X3, cranial nerves II-XII grossly intact, moves all extremities normally.  Psych: Patient is not psychotic, no suicidal or hemocidal ideation.  Labs on Admission: I have personally reviewed following labs and imaging studies  CBC: Recent Labs  Lab 01/16/21 1507  WBC 6.7  HGB 10.4*  HCT 31.0*  MCV 87.3  PLT 725   Basic Metabolic Panel: Recent Labs  Lab 01/16/21 1507  NA 132*  K 4.6  CL 100  CO2 26  GLUCOSE 120*  BUN 25*  CREATININE 1.24  CALCIUM 8.7*   GFR: Estimated Creatinine Clearance: 40.9 mL/min (by C-G formula based on SCr of 1.24 mg/dL). Liver Function Tests: No results for input(s): AST, ALT, ALKPHOS, BILITOT, PROT, ALBUMIN in the last 168 hours. No results for input(s): LIPASE, AMYLASE in the last 168 hours. No results for input(s): AMMONIA in the last 168 hours. Coagulation Profile: Recent Labs  Lab 01/17/21 0419  INR 1.1   Cardiac Enzymes: No results for input(s): CKTOTAL, CKMB, CKMBINDEX, TROPONINI in the last 168 hours. BNP (last 3 results) No results for input(s): PROBNP in the last 8760 hours. HbA1C: Recent Labs    01/17/21 1220  HGBA1C 5.0   CBG: No results for input(s): GLUCAP in the last 168 hours. Lipid Profile: No results for input(s): CHOL, HDL, LDLCALC, TRIG, CHOLHDL, LDLDIRECT in the last 72 hours. Thyroid Function Tests: No results for input(s): TSH, T4TOTAL, FREET4, T3FREE, THYROIDAB in the last 72 hours. Anemia Panel: No results for input(s): VITAMINB12, FOLATE, FERRITIN, TIBC, IRON, RETICCTPCT in the last 72 hours. Urine analysis:    Component Value Date/Time   COLORURINE YELLOW 12/25/2020 1556   APPEARANCEUR Cloudy (A) 12/25/2020 1556   LABSPEC 1.010 12/25/2020 1556   PHURINE 5.5 12/25/2020 1556   GLUCOSEU NEGATIVE 12/25/2020 1556   HGBUR  SMALL (A) 12/25/2020 1556   BILIRUBINUR NEGATIVE 12/25/2020 1556   BILIRUBINUR negative 07/29/2017 1005   KETONESUR NEGATIVE 12/25/2020 1556   PROTEINUR Positive (A) 07/29/2017 1005   UROBILINOGEN 0.2 12/25/2020 1556   NITRITE NEGATIVE 12/25/2020 1556   LEUKOCYTESUR LARGE (  A) 12/25/2020 1556   Sepsis Labs: @LABRCNTIP (procalcitonin:4,lacticidven:4) ) Recent Results (from the past 240 hour(s))  Resp Panel by RT-PCR (Flu A&B, Covid) Nasopharyngeal Swab     Status: None   Collection Time: 01/17/21 12:34 AM   Specimen: Nasopharyngeal Swab; Nasopharyngeal(NP) swabs in vial transport medium  Result Value Ref Range Status   SARS Coronavirus 2 by RT PCR NEGATIVE NEGATIVE Final    Comment: (NOTE) SARS-CoV-2 target nucleic acids are NOT DETECTED.  The SARS-CoV-2 RNA is generally detectable in upper respiratory specimens during the acute phase of infection. The lowest concentration of SARS-CoV-2 viral copies this assay can detect is 138 copies/mL. A negative result does not preclude SARS-Cov-2 infection and should not be used as the sole basis for treatment or other patient management decisions. A negative result may occur with  improper specimen collection/handling, submission of specimen other than nasopharyngeal swab, presence of viral mutation(s) within the areas targeted by this assay, and inadequate number of viral copies(<138 copies/mL). A negative result must be combined with clinical observations, patient history, and epidemiological information. The expected result is Negative.  Fact Sheet for Patients:  EntrepreneurPulse.com.au  Fact Sheet for Healthcare Providers:  IncredibleEmployment.be  This test is no t yet approved or cleared by the [place] FDA and  has been authorized for detection and/or diagnosis of SARS-CoV-2 by FDA under an Emergency Use Authorization (EUA). This EUA will remain  in effect (meaning this test can be used)  for the duration of the COVID-19 declaration under Section 564(b)(1) of the Act, 21 U.S.C.section 360bbb-3(b)(1), unless the authorization is terminated  or revoked sooner.       Influenza A by PCR NEGATIVE NEGATIVE Final   Influenza B by PCR NEGATIVE NEGATIVE Final    Comment: (NOTE) The Xpert Xpress SARS-CoV-2/FLU/RSV plus assay is intended as an aid in the diagnosis of influenza from Nasopharyngeal swab specimens and should not be used as a sole basis for treatment. Nasal washings and aspirates are unacceptable for Xpert Xpress SARS-CoV-2/FLU/RSV testing.  Fact Sheet for Patients: EntrepreneurPulse.com.au  Fact Sheet for Healthcare Providers: IncredibleEmployment.be  This test is not yet approved or cleared by the [place] FDA and has been authorized for detection and/or diagnosis of SARS-CoV-2 by FDA under an Emergency Use Authorization (EUA). This EUA will remain in effect (meaning this test can be used) for the duration of the COVID-19 declaration under Section 564(b)(1) of the Act, 21 U.S.C. section 360bbb-3(b)(1), unless the authorization is terminated or revoked.  Performed at [facility], [address] [place], [place] [address]      Radiological Exams on Admission: DG Chest 2 View  Result Date: 01/16/2021 CLINICAL DATA:  Chest pain for a few weeks worse with activity EXAM: CHEST - 2 VIEW COMPARISON:  November 10, 2017 FINDINGS: The heart size and mediastinal contours are within normal limits. Aortic atherosclerosis. Chronic bronchitic lung changes. Anterior left pleural thickening versus loculated effusion. Left basilar airspace opacity. Thoracic spondylosis. IMPRESSION: Anterior left pleural thickening versus loculated effusion with a left basilar airspace opacity which may reflect atelectasis or infiltrate. Electronically Signed   By: Dahlia Bailiff M.D.   On: 01/16/2021 17:33   CT Chest Wo Contrast  Result  Date: 01/17/2021 CLINICAL DATA:  Respiratory illness. EXAM: CT CHEST WITHOUT CONTRAST TECHNIQUE: Multidetector CT imaging of the chest was performed following the standard protocol without IV contrast. COMPARISON:  Chest radiograph dated 01/16/2021 and CT dated 11/10/2017. FINDINGS: Evaluation of this exam is limited in the absence of  intravenous contrast. Cardiovascular: Borderline cardiomegaly. No significant pericardial effusion. Advanced 3 vessel coronary vascular calcification. There is moderate atherosclerotic calcification of the thoracic aorta. No aneurysmal dilatation. The central pulmonary arteries are grossly unremarkable. Mediastinum/Nodes: No hilar or mediastinal adenopathy. The esophagus is grossly unremarkable. No mediastinal fluid collection. Lungs/Pleura: Moderate left and small right pleural effusions. There is partial compressive atelectasis of the left lower lobe. There are however patchy areas of nodular density and consolidative changes involving the left lower lobe and lingula most concerning for pneumonia or aspiration. Clinical correlation and follow-up to resolution recommended. No pneumothorax. The central airways are patent. Small calcified granuloma along the right minor fissure. Upper Abdomen: Diffuse colonic diverticulosis. Bilateral renal hypodense lesions, incompletely characterized, possibly cysts. Musculoskeletal: Osteopenia with degenerative changes of the spine. No acute osseous pathology. IMPRESSION: 1. Moderate left and small right pleural effusions with partial compressive atelectasis of the left lower lobe. 2. Patchy areas of nodular density and consolidative changes involving the left lower lobe and lingula most concerning for pneumonia or aspiration. 3. Aortic Atherosclerosis (ICD10-I70.0). Electronically Signed   By: Anner Crete M.D.   On: 01/17/2021 03:53     EKG: I have personally reviewed.  Sinus rhythm, QTC 429, LAD, PVC, poor R wave progression, ST depression  in the lead I/aVL  Assessment/Plan Principal Problem:   Acute on chronic systolic CHF (congestive heart failure) (HCC) Active Problems:   HLD (hyperlipidemia)   Essential hypertension   Benign prostatic hyperplasia   Essential tremor   CAD (coronary artery disease)   NSTEMI (non-ST elevated myocardial infarction) (HCC)   CKD (chronic kidney disease), stage IIIa   Pleural effusion  Acute on chronic systolic CHF (congestive heart failure) ([place]): 2D echo on 11/14/2020 showed EF of 40%.  Patient has shortness breath, 1+ leg edema, positive JVD, crackles on auscultation, elevated BNP 793, clinically consistent with CHF exacerbation.  CT scan of chest that showed some patchy infiltration, but the patient does not have fever or leukocytosis.  Clinically does not seem to have pneumonia.  Procalcitonin is normal.  Patient received 1 dose of Rocephin and azithromycin in ED.  Will hold off antibiotics now.  -Will admit to progressive unit as inpatient -Lasix 40 mg bid by IV - Daily weights -strict I/O's -Low salt diet -Fluid restriction -Obtain REDs Vest reading  History of CAD, chest pressure and NSTEMI (non-ST elevated myocardial infarction) [facility]): Patient has chest pressure, troponin is elevated at 65, 81, 146, treating up. -IV heparin started in ED, will continue now -Trend troponin -Check A1c, FLP -As needed nitroglycerin -Aspirin, Plavix, Imdur -Patient is allergic to statin  HLD (hyperlipidemia): Patient is allergic to Statin. Will get FLP, if LDL is still significantly elevated, may consider Zetia -f/u FLP  Essential hypertension -IV hydralazine as needed -Cozaar, metoprolol,  Benign prostatic hyperplasia -Cardura, Proscar  Essential tremor -Primidone  CKD (chronic kidney disease), stage IIIa: Stable -Follow-up with BMP  Pleural effusion: Likely due to CHF exacerbation -On IV Lasix         DVT ppx: on IV Heparin    Code Status: DNR per pt and his wife Family  Communication: yes, patient's  wife  at bed side.  Disposition Plan:  Anticipate discharge back to previous environment Consults called: Dr. Nehemiah Massed of cardiology Admission status and Level of care: Progressive:     as inpt     Status is: Inpatient  Remains inpatient appropriate because: Patient has multiple comorbidities, now presents with acute on chronic systolic CHF exacerbation  and non-STEMI.  Her presentation is highly complicated.  Patient is at high risk of deteriorating. Will need to be treated in hospital for at least 2 days.          Date of Service 01/17/2021    [facility]   If 7PM-7AM, please contact night-coverage [email or web address] 01/17/2021, 4:35 PM

## 2021-01-17 NOTE — ED Notes (Signed)
Pt to CT

## 2021-01-17 NOTE — Progress Notes (Signed)
[place] for heparin infusion Indication: NSTEMI w/ chest pressure  Allergies  Allergen Reactions   Penicillins Swelling    Of tongue Has patient had a PCN reaction causing immediate rash, facial/tongue/throat swelling, SOB or lightheadedness with hypotension: Yes Has patient had a PCN reaction causing severe rash involving mucus membranes or skin necrosis: No Has patient had a PCN reaction that required hospitalization: No Has patient had a PCN reaction occurring within the last 10 years: No If all of the above answers are "NO", then may proceed with Cephalosporin use.   Cialis [Tadalafil] Other (See Comments)    Indigestion and "felt like crap"   Lipitor [Atorvastatin] Other (See Comments)    Severe myalgias   Lisinopril Cough    Dry nagging cough    Patient Measurements: Height: 5\' 10"  (177.8 cm) Weight: 84.8 kg (187 lb) IBW/kg (Calculated) : 73 Heparin Dosing Weight: 84.8 kg  Vital Signs: BP: 127/103 (12/28 1215) Pulse Rate: 73 (12/28 1215)  Labs: Recent Labs    01/16/21 1507 01/17/21 0034 01/17/21 0419 01/17/21 1054 01/17/21 1220 01/17/21 1223  HGB 10.4*  --   --   --   --   --   HCT 31.0*  --   --   --   --   --   PLT 322  --   --   --   --   --   APTT  --   --  31  --   --   --   LABPROT  --   --  14.6  --   --   --   INR  --   --  1.1  --   --   --   HEPARINUNFRC  --   --   --   --   --  0.13*  CREATININE 1.24  --   --   --   --   --   TROPONINIHS 65* 81*  --  146* 162*  --      Estimated Creatinine Clearance: 40.9 mL/min (by C-G formula based on SCr of 1.24 mg/dL).   Medical History: Past Medical History:  Diagnosis Date   Abnormal drug screen 06/2014   inapprop neg xanax rpt 3 mo (06/2014)   BPH (benign prostatic hypertrophy) 01/21/98   has had 3 biopsies in past ([facility]) decided to stop PSA/DRE   Hyperlipidemia 01/21/02   Hypertension 05/22/03   Left lumbar radiculopathy    Osteoarthritis 01/22/88   knees,  lumbar spondylosis and listhesis   Assessment: Pt is 87 yo male w/ h/o CHF, presenting to ED c/o worsening SOB found with slightly elevated Troponin I, trending up slowly. Pharmacy has been consulted for heparin dosing/monitoring.  12/28 1223 HL 0.13, subtherapeutic  Goal of Therapy:  Heparin level 0.3-0.7 units/ml Monitor platelets by anticoagulation protocol: Yes   Plan:  HL subtherapeutic. Will give 2500 bolus and increase heparin infusion rate to 1400 units/hr Check HL in 8 hr after rate change CBC daily while on heparin  Sherilyn Banker, PharmD Clinical Pharmacist  01/17/2021 1:24 PM

## 2021-01-17 NOTE — Progress Notes (Signed)
PHARMACIST - PHYSICIAN ORDER COMMUNICATION  CONCERNING: P&T Medication Policy on Herbal Medications  DESCRIPTION:  This patient's order for:  Co-Q-10  has been noted.  This product(s) is classified as an "herbal" or natural product. Due to a lack of definitive safety studies or FDA approval, nonstandard manufacturing practices, plus the potential risk of unknown drug-drug interactions while on inpatient medications, the Pharmacy and Therapeutics Committee does not permit the use of "herbal" or natural products of this type within [facility].   ACTION TAKEN: The pharmacy department is unable to verify this order at this time. Please reevaluate patient's clinical condition at discharge and address if the herbal or natural product(s) should be resumed at that time.   

## 2021-01-17 NOTE — Progress Notes (Signed)
[place] for heparin infusion Indication: NSTEMI w/ chest pressure  Allergies  Allergen Reactions   Penicillins Swelling    Of tongue Has patient had a PCN reaction causing immediate rash, facial/tongue/throat swelling, SOB or lightheadedness with hypotension: Yes Has patient had a PCN reaction causing severe rash involving mucus membranes or skin necrosis: No Has patient had a PCN reaction that required hospitalization: No Has patient had a PCN reaction occurring within the last 10 years: No If all of the above answers are "NO", then may proceed with Cephalosporin use.   Cialis [Tadalafil] Other (See Comments)    Indigestion and "felt like crap"   Lipitor [Atorvastatin] Other (See Comments)    Severe myalgias   Lisinopril Cough    Dry nagging cough    Patient Measurements: Height: 5\' 10"  (177.8 cm) Weight: 84.8 kg (187 lb) IBW/kg (Calculated) : 73 Heparin Dosing Weight: 84.8 kg  Vital Signs: Temp: 97.8 F (36.6 C) (12/28 0033) Temp Source: Oral (12/28 0033) BP: 168/96 (12/28 0300) Pulse Rate: 93 (12/28 0300)  Labs: Recent Labs    01/16/21 1507 01/17/21 0034  HGB 10.4*  --   HCT 31.0*  --   PLT 322  --   CREATININE 1.24  --   TROPONINIHS 65* 81*    Estimated Creatinine Clearance: 40.9 mL/min (by C-G formula based on SCr of 1.24 mg/dL).   Medical History: Past Medical History:  Diagnosis Date   Abnormal drug screen 06/2014   inapprop neg xanax rpt 3 mo (06/2014)   BPH (benign prostatic hypertrophy) 01/21/98   has had 3 biopsies in past ([facility]) decided to stop PSA/DRE   Hyperlipidemia 01/21/02   Hypertension 05/22/03   Left lumbar radiculopathy    Osteoarthritis 01/22/88   knees, lumbar spondylosis and listhesis   Assessment: Pt is 87 yo male w/ h/o CHF, presenting to ED c/o worsening SOB found with slightly elevated Troponin I, trending up slowly.  Goal of Therapy:  Heparin level 0.3-0.7 units/ml Monitor platelets by  anticoagulation protocol: Yes   Plan:  Bolus 4000 units x 1 Start heparin infusion at 1100 units/hr Check HL in 8 hr after start of infusion CBC daily while on heparin.  Renda Rolls, PharmD, [facility] 01/17/2021 4:17 AM

## 2021-01-17 NOTE — ED Notes (Signed)
Pt ambulatory from triage to the room without assistance.

## 2021-01-17 NOTE — ED Provider Notes (Signed)
[facility] Emergency Department Provider Note  ____________________________________________   Event Date/Time   First MD Initiated Contact with Patient 01/17/21 [phone]     (approximate)  I have reviewed the triage vital signs and the nursing notes.   HISTORY  Chief Complaint Chest Pain    HPI Shawn Andrews is a 87 y.o. male   whose medical history most notably includes a prior MI status post stent and history of CHF with reduced ejection fraction.  Dr. Nehemiah Massed is his cardiologist.  He presents for evaluation of gradually worsening shortness of breath particularly with exertion over the last week.  Exertion makes his symptoms much worse and this is much more significant than previously.  He occasionally takes a fluid pill but he has not been taking it recently.  He claims that he does not believe he has gained weight but he has noticed increased swelling in his legs and his wife states that he has had increased abdominal swelling.  Starting today he had a heaviness to his chest which gets worse with exertion.  No sharp pain.  He denies nausea and vomiting.  No recent fever or chills.        Past Medical History:  Diagnosis Date   Abnormal drug screen 06/2014   inapprop neg xanax rpt 3 mo (06/2014)   BPH (benign prostatic hypertrophy) 01/21/98   has had 3 biopsies in past ([place]) decided to stop PSA/DRE   Hyperlipidemia 01/21/02   Hypertension 05/22/03   Left lumbar radiculopathy    Osteoarthritis 01/22/88   knees, lumbar spondylosis and listhesis    Patient Active Problem List   Diagnosis Date Noted   Constipation 12/13/2020   Hyponatremia 12/13/2020   COVID-19 virus infection 12/08/2020   NSTEMI (non-ST elevated myocardial infarction) ([place]) 10/16/2020   HFrEF (heart failure with reduced ejection fraction) ([place]) 10/16/2020   CAD (coronary artery disease) 10/08/2020   Elevated vitamin B12 level 08/31/2020   Leg DVT (deep venous thromboembolism),  acute, left ([place]) 08/09/2019   Skin lesion 07/18/2016   Health maintenance examination 07/19/2015   Advanced care planning/counseling discussion 07/05/2014   Memory deficit 07/05/2014   CKD (chronic kidney disease) stage 3, GFR 30-59 ml/min ([place]) 06/23/2013   Post-nasal drainage 12/22/2012   Medicare annual wellness visit, subsequent 06/15/2012   Lumbosacral radiculopathy at L5 06/17/2011   High frequency hearing loss 10/18/2010   Vitamin D deficiency 11/15/2008   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   Essential hypertension 05/22/2003   HLD (hyperlipidemia) 01/21/2002   Essential tremor 01/22/2000   ELEVATED PROSTATE SPECIFIC ANTIGEN 01/22/2000   Benign prostatic hyperplasia 01/21/1998   Osteoarthritis 01/22/1988    Past Surgical History:  Procedure Laterality Date   CATARACT EXTRACTION  2013   bilateral   ESI Left 08/2014, 11/2014, 08/2015   L S1, L L5/S1 transforaminal ESI; L4/5 L5/S1 zygapophysial injections, L S1 transforaminal ([place])   FINGER SURGERY     right middle, partial traumatic amputation   KNEE ARTHROSCOPY  1990   right   KNEE SURGERY  04/2006   right partial knee replacement in [place] - rec ppx abx for any invasive procedure    Prior to Admission medications   Medication Sig Start Date End Date Taking? Authorizing Provider  acetaminophen (TYLENOL 8 HOUR) 650 MG CR tablet Take 1 tablet (650 mg total) by mouth 2 (two) times a day. 06/24/18   Ria Bush, MD  aspirin (ASPIRIN 81) 81 MG EC tablet Take 1 tablet (81 mg total) by mouth  every Monday, Wednesday, and Friday. 08/30/20   Ria Bush, MD  atorvastatin (LIPITOR) 40 MG tablet Take 1 tablet by mouth at bedtime. 10/09/20   [provider]  Cholecalciferol (VITAMIN D3) 25 MCG (1000 UT) CAPS Take 1 capsule (1,000 Units total) by mouth daily. 08/25/18   Ria Bush, MD  clindamycin (CLEOCIN) 150 MG capsule Take 4 capsules (600 mg total) by mouth as directed. Prior to dental procedures 10/16/20    Ria Bush, MD  clopidogrel (PLAVIX) 75 MG tablet Take 1 tablet by mouth daily. 10/10/20   [provider]  Coenzyme Q10 (CO Q-10) 100 MG CAPS Take 1 capsule by mouth daily. 12/21/20   Ria Bush, MD  docusate sodium (COLACE) 100 MG capsule Take 1 capsule (100 mg total) by mouth daily. 12/13/20   Ria Bush, MD  doxazosin (CARDURA) 4 MG tablet TAKE 1 TABLET BY MOUTH  DAILY 11/17/19   Ria Bush, MD  famotidine (PEPCID) 20 MG tablet Take 1 tablet (20 mg total) by mouth at bedtime. 08/30/20   Ria Bush, MD  finasteride (PROSCAR) 5 MG tablet TAKE 1 TABLET BY MOUTH  DAILY 11/17/19   Ria Bush, MD  furosemide (LASIX) 20 MG tablet Take 1 tablet by mouth every other day. 10/09/20   [provider]  isosorbide mononitrate (IMDUR) 30 MG 24 hr tablet Take 1 tablet by mouth daily. 10/10/20   [provider]  loratadine (CLARITIN) 10 MG tablet Take 1 tablet (10 mg total) by mouth daily as needed for allergies. 12/22/12   Ria Bush, MD  losartan (COZAAR) 50 MG tablet Take 1 tablet (50 mg total) by mouth daily. 12/13/20   Ria Bush, MD  metoprolol tartrate (LOPRESSOR) 50 MG tablet Take 0.5 tablets (25 mg total) by mouth 2 (two) times daily. 10/16/20   Ria Bush, MD  nitroGLYCERIN (NITROSTAT) 0.4 MG SL tablet Place under the tongue. 10/09/20   [provider]  polyethylene glycol powder (GLYCOLAX/MIRALAX) 17 GM/SCOOP powder Take 8.5-17 g by mouth daily as needed for moderate constipation. 12/13/20   Ria Bush, MD  primidone (MYSOLINE) 50 MG tablet Take 2 tablets (100 mg total) by mouth 3 (three) times daily. 10/16/20   Ria Bush, MD  rOPINIRole (REQUIP) 0.5 MG tablet Take 1 tablet (0.5 mg total) by mouth in the morning and at bedtime. 08/25/19   Ria Bush, MD  vitamin B-12 (CYANOCOBALAMIN) 500 MCG tablet Take 1 tablet (500 mcg total) by mouth once a week. 08/30/20   Ria Bush, MD     Allergies Penicillins, Cialis [tadalafil], Lipitor [atorvastatin], and Lisinopril  Family History  Problem Relation Age of Onset   Hypertension Mother    Heart disease Brother 35       MI   Diabetes Brother    Cancer Daughter        melanoma   Stroke Neg Hx     Social History Social History   Tobacco Use   Smoking status: Former    Packs/day: 0.00    Years: 0.00    Pack years: 0.00    Types: Pipe, Cigarettes    Quit date: 12/26/2000    Years since quitting: 20.0   Smokeless tobacco: Current    Types: Chew   Tobacco comments:    quit over 30 years ago  Vaping Use   Vaping Use: Never used  Substance Use Topics   Alcohol use: Yes    Alcohol/week: 0.0 standard drinks    Comment: Occasional beer   Drug use: No  Review of Systems Constitutional: No fever/chills Eyes: No visual changes. ENT: No sore throat. Cardiovascular: Positive for chest pressure. Respiratory: Positive for dyspnea particularly with exertion that has become severe. Gastrointestinal: No abdominal pain.  No nausea, no vomiting.  No diarrhea.  No constipation. Genitourinary: Negative for dysuria. Musculoskeletal: Some swelling in his legs.  Negative for neck pain.  Negative for back pain. Integumentary: Negative for rash. Neurological: Negative for headaches, focal weakness or numbness.   ____________________________________________   PHYSICAL EXAM:  VITAL SIGNS: ED Triage Vitals  Enc Vitals Group     BP 01/16/21 1655 123/87     Pulse Rate 01/16/21 1655 91     Resp 01/16/21 1655 20     Temp 01/16/21 1655 (!) 97.5 F (36.4 C)     Temp Source 01/16/21 1655 Oral     SpO2 01/16/21 1655 92 %     Weight 01/16/21 1656 84.8 kg (187 lb)     Height 01/16/21 1656 1.778 m (5\' 10" )     Head Circumference --      Peak Flow --      Pain Score 01/16/21 1656 0     Pain Loc --      Pain Edu? --      Excl. in [place]? --     Constitutional: Alert and oriented.  Hard of hearing. Eyes: Conjunctivae  are normal.  Head: Atraumatic. Nose: No congestion/rhinnorhea. Mouth/Throat: Patient is wearing a mask. Neck: No stridor.  No meningeal signs.   Cardiovascular: Borderline tachycardia, regular rhythm. Good peripheral circulation. Respiratory: Increased respiratory effort and tachypnea with coarse breath sounds consistent with volume overload. Gastrointestinal: Soft and nontender. No distention.  Musculoskeletal: 1+ pitting edema in bilateral lower extremities.. No gross deformities of extremities. Neurologic:  Normal speech and language. No gross focal neurologic deficits are appreciated.  Skin:  Skin is warm, dry and intact. Psychiatric: Mood and affect are normal. Speech and behavior are normal.  ____________________________________________   LABS (all labs ordered are listed, but only abnormal results are displayed)  Labs Reviewed  BASIC METABOLIC PANEL - Abnormal; Notable for the following components:      Result Value   Sodium 132 (*)    Glucose, Bld 120 (*)    BUN 25 (*)    Calcium 8.7 (*)    GFR, Estimated 55 (*)    All other components within normal limits  CBC - Abnormal; Notable for the following components:   RBC 3.55 (*)    Hemoglobin 10.4 (*)    HCT 31.0 (*)    All other components within normal limits  TROPONIN I (HIGH SENSITIVITY) - Abnormal; Notable for the following components:   Troponin I (High Sensitivity) 65 (*)    All other components within normal limits  TROPONIN I (HIGH SENSITIVITY) - Abnormal; Notable for the following components:   Troponin I (High Sensitivity) 81 (*)    All other components within normal limits  RESP PANEL BY RT-PCR (FLU A&B, COVID) ARPGX2  BRAIN NATRIURETIC PEPTIDE  PROTIME-INR  APTT   ____________________________________________  EKG  ED ECG REPORT I, Hinda Kehr, the attending physician, personally viewed and interpreted this ECG.  Date: 01/16/2021 EKG Time: 17: 00 Rate: 81 Rhythm: Sinus rhythm with occasional PVC  and PAC QRS Axis: normal Intervals: normal ST/T Wave abnormalities: Non-specific ST segment / T-wave changes, but no clear evidence of acute ischemia. Narrative Interpretation: no definitive evidence of acute ischemia; does not meet STEMI criteria.  ____________________________________________  RADIOLOGY I, Hinda Kehr,  personally viewed and evaluated these images (plain radiographs) as part of my medical decision making, as well as reviewing the written report by the radiologist.  ED MD interpretation: Chest x-ray concerning for possible loculated effusion and questionable atelectasis or infiltrate left basilar airspace.  CT chest confirms pleural effusions bilaterally and compressive atelectasis of the left lower lobe, but the patient also has patchy areas of the left lower lobe and lingula concerning for pneumonia or aspiration.  Official radiology report(s): DG Chest 2 View  Result Date: 01/16/2021 CLINICAL DATA:  Chest pain for a few weeks worse with activity EXAM: CHEST - 2 VIEW COMPARISON:  November 10, 2017 FINDINGS: The heart size and mediastinal contours are within normal limits. Aortic atherosclerosis. Chronic bronchitic lung changes. Anterior left pleural thickening versus loculated effusion. Left basilar airspace opacity. Thoracic spondylosis. IMPRESSION: Anterior left pleural thickening versus loculated effusion with a left basilar airspace opacity which may reflect atelectasis or infiltrate. Electronically Signed   By: Dahlia Bailiff M.D.   On: 01/16/2021 17:33   CT Chest Wo Contrast  Result Date: 01/17/2021 CLINICAL DATA:  Respiratory illness. EXAM: CT CHEST WITHOUT CONTRAST TECHNIQUE: Multidetector CT imaging of the chest was performed following the standard protocol without IV contrast. COMPARISON:  Chest radiograph dated 01/16/2021 and CT dated 11/10/2017. FINDINGS: Evaluation of this exam is limited in the absence of intravenous contrast. Cardiovascular: Borderline  cardiomegaly. No significant pericardial effusion. Advanced 3 vessel coronary vascular calcification. There is moderate atherosclerotic calcification of the thoracic aorta. No aneurysmal dilatation. The central pulmonary arteries are grossly unremarkable. Mediastinum/Nodes: No hilar or mediastinal adenopathy. The esophagus is grossly unremarkable. No mediastinal fluid collection. Lungs/Pleura: Moderate left and small right pleural effusions. There is partial compressive atelectasis of the left lower lobe. There are however patchy areas of nodular density and consolidative changes involving the left lower lobe and lingula most concerning for pneumonia or aspiration. Clinical correlation and follow-up to resolution recommended. No pneumothorax. The central airways are patent. Small calcified granuloma along the right minor fissure. Upper Abdomen: Diffuse colonic diverticulosis. Bilateral renal hypodense lesions, incompletely characterized, possibly cysts. Musculoskeletal: Osteopenia with degenerative changes of the spine. No acute osseous pathology. IMPRESSION: 1. Moderate left and small right pleural effusions with partial compressive atelectasis of the left lower lobe. 2. Patchy areas of nodular density and consolidative changes involving the left lower lobe and lingula most concerning for pneumonia or aspiration. 3. Aortic Atherosclerosis (ICD10-I70.0). Electronically Signed   By: Anner Crete M.D.   On: 01/17/2021 03:53    ____________________________________________   PROCEDURES   Procedure(s) performed (including Critical Care):  .1-3 Lead EKG Interpretation Performed by: Hinda Kehr, MD Authorized by: Hinda Kehr, MD     Interpretation: normal     ECG rate:  95   ECG rate assessment: normal     Rhythm: sinus rhythm     Ectopy: PAC and PVCs     Conduction: normal   .Critical Care Performed by: Hinda Kehr, MD Authorized by: Hinda Kehr, MD   Critical care provider statement:     Critical care time (minutes):  45   Critical care time was exclusive of:  Separately billable procedures and treating other patients   Critical care was necessary to treat or prevent imminent or life-threatening deterioration of the following conditions:  Respiratory failure and cardiac failure   Critical care was time spent personally by me on the following activities:  Development of treatment plan with patient or surrogate, evaluation of patient's response to treatment, examination  of patient, obtaining history from patient or surrogate, ordering and performing treatments and interventions, ordering and review of laboratory studies, ordering and review of radiographic studies, pulse oximetry, re-evaluation of patient's condition and review of old charts   ____________________________________________   INITIAL IMPRESSION / MDM / [place] / ED COURSE  As part of my medical decision making, I reviewed the following data within the [place] History obtained from family, Nursing notes reviewed and incorporated, Labs reviewed , EKG interpreted , Old chart reviewed, Radiograph reviewed , Discussed with admitting physician , and Notes from prior ED visits   Differential diagnosis includes, but is not limited to, CHF exacerbation, ACS, pleural effusions, less likely PE.  Pneumonia is also possible.  The patient is on the cardiac monitor to evaluate for evidence of arrhythmia and/or significant heart rate changes.  Vitals are stable with no oxygen requirement.  However the patient is in at least mild if not moderate respiratory distress with increased work of breathing, accessory muscle usage, and tachypnea.  His presentation is most consistent with volume overload.  He becomes extremely short of breath with exertion to the point of near syncope.  He does not need BiPAP but it appears that he would benefit from diuresis and I ordered furosemide 40 mg IV.  I personally  reviewed the patient's imaging and agree with the radiologist's interpretation that there is a questionable area of atelectasis versus infection.  I will further assess with CT chest without contrast to evaluate the possibility of volume overload/effusion/edema/infection.  I am also concerned that the patient's troponin is trending up.  Initially was in the 60s and then went up to the 80s.  This is not substantial and likely represents some demand ischemia, but he is 87 years old with a history of ACS and prior MI.  I am starting him on heparin bolus plus infusion as a precaution in case he is having an NSTEMI as of the result of the demand on his heart from what appears to be a primarily CHF exacerbation.  His basic metabolic panel is essentially normal.  CBC is more or less normal without any substantial abnormalities.  Respiratory viral panel is normal.  BNP is in process and I ordered coagulation studies as well.      Clinical Course as of 01/17/21 0432  Wed Jan 17, 2021  0410 CT Chest Wo Contrast Chest CT notable for pleural effusions.  There is also an area that the radiologist is concerned may represent pneumonia.  I think it more likely that this is a loculated area of fluid but I will treat empirically with ceftriaxone 1 g IV and azithromycin 500 mg IV.  There is a nonspecific history of swelling associated with penicillins but I think that it is unlikely he will have a cross-reactivity with ceftriaxone and it is likely the best choice for him with low risk.  Proceeding with plan for admission and I consult the hospitalist service. [CF]  [phone] Discussed case by secure chat text with Dr. Damita Dunnings with the hospitalist service who will admit. [CF]    Clinical Course User Index [CF] Hinda Kehr, MD     ____________________________________________  FINAL CLINICAL IMPRESSION(S) / ED DIAGNOSES  Final diagnoses:  Acute on chronic congestive heart failure, unspecified heart failure type  ([place])  Pleural effusion  Elevated troponin level  Chest pressure     MEDICATIONS GIVEN DURING THIS VISIT:  Medications  cefTRIAXone (ROCEPHIN) 1 g in sodium chloride 0.9 %  100 mL IVPB (1 g Intravenous New Bag/Given 01/17/21 0418)  azithromycin (ZITHROMAX) 500 mg in sodium chloride 0.9 % 250 mL IVPB (has no administration in time range)  heparin bolus via infusion 4,000 Units (has no administration in time range)  heparin ADULT infusion 100 units/mL (25000 units/263mL) (has no administration in time range)  aspirin chewable tablet 324 mg (324 mg Oral Given 01/17/21 0238)  furosemide (LASIX) injection 40 mg (40 mg Intravenous Given 01/17/21 0346)     ED Discharge Orders     None        Note:  This document was prepared using Dragon voice recognition software and may include unintentional dictation errors.   Hinda Kehr, MD 01/17/21 [phone]

## 2021-01-17 NOTE — ED Notes (Signed)
Pt endorsing groin pain and urinary retention - this RN obtained a bladder scan and informed MD Damita Dunnings). View orders for intervention.

## 2021-01-17 NOTE — ED Notes (Signed)
Lab contacted about the add on.

## 2021-01-18 ENCOUNTER — Inpatient Hospital Stay: Payer: Medicare Other

## 2021-01-18 ENCOUNTER — Encounter: Payer: Self-pay | Admitting: Internal Medicine

## 2021-01-18 ENCOUNTER — Encounter
Admission: EM | Disposition: A | Discharge status: Home / Self Care | Payer: Self-pay | Source: Home / Self Care | Attending: Family Medicine

## 2021-01-18 DIAGNOSIS — I5023 Acute on chronic systolic (congestive) heart failure: Secondary | ICD-10-CM | POA: Diagnosis not present

## 2021-01-18 HISTORY — PX: LEFT HEART CATH AND CORONARY ANGIOGRAPHY: CATH118249

## 2021-01-18 LAB — CBC WITH DIFFERENTIAL/PLATELET
Abs Immature Granulocytes: 0.02 10*3/uL (ref 0.00–0.07)
Basophils Absolute: 0 10*3/uL (ref 0.0–0.1)
Basophils Relative: 0 %
Eosinophils Absolute: 0.2 10*3/uL (ref 0.0–0.5)
Eosinophils Relative: 2 %
HCT: 29.9 % — ABNORMAL LOW (ref 39.0–52.0)
Hemoglobin: 10.2 g/dL — ABNORMAL LOW (ref 13.0–17.0)
Immature Granulocytes: 0 %
Lymphocytes Relative: 17 %
Lymphs Abs: 1.2 10*3/uL (ref 0.7–4.0)
MCH: 28.8 pg (ref 26.0–34.0)
MCHC: 34.1 g/dL (ref 30.0–36.0)
MCV: 84.5 fL (ref 80.0–100.0)
Monocytes Absolute: 0.7 10*3/uL (ref 0.1–1.0)
Monocytes Relative: 9 %
Neutro Abs: 5 10*3/uL (ref 1.7–7.7)
Neutrophils Relative %: 72 %
Platelets: 272 10*3/uL (ref 150–400)
RBC: 3.54 MIL/uL — ABNORMAL LOW (ref 4.22–5.81)
RDW: 14.4 % (ref 11.5–15.5)
WBC: 7.1 10*3/uL (ref 4.0–10.5)
nRBC: 0 % (ref 0.0–0.2)

## 2021-01-18 LAB — NM MYOCAR MULTI W/SPECT W/WALL MOTION / EF
Angina Index: 2
Duke Treadmill Score: -6
Estimated workload: 4.6
Exercise duration (min): 2 min
Exercise duration (sec): 16 s
LV dias vol: 140 mL (ref 62–150)
LV sys vol: 89 mL
Nuc Stress EF: 36 %
Peak HR: 148 {beats}/min
Percent HR: 113 %
Rest Nuclear Isotope Dose: 10.7 mCi
SDS: 1
SRS: 5
SSS: 1
ST Depression (mm): 0 mm
Stress Nuclear Isotope Dose: 32.3 mCi
TID: 1.06

## 2021-01-18 LAB — COMPREHENSIVE METABOLIC PANEL
ALT: 13 U/L (ref 0–44)
AST: 18 U/L (ref 15–41)
Albumin: 3.2 g/dL — ABNORMAL LOW (ref 3.5–5.0)
Alkaline Phosphatase: 67 U/L (ref 38–126)
Anion gap: 6 (ref 5–15)
BUN: 23 mg/dL (ref 8–23)
CO2: 25 mmol/L (ref 22–32)
Calcium: 8.5 mg/dL — ABNORMAL LOW (ref 8.9–10.3)
Chloride: 99 mmol/L (ref 98–111)
Creatinine, Ser: 1.36 mg/dL — ABNORMAL HIGH (ref 0.61–1.24)
GFR, Estimated: 49 mL/min — ABNORMAL LOW (ref >60)
Glucose, Bld: 128 mg/dL — ABNORMAL HIGH (ref 70–99)
Potassium: 3.9 mmol/L (ref 3.5–5.1)
Sodium: 130 mmol/L — ABNORMAL LOW (ref 135–145)
Total Bilirubin: 0.8 mg/dL (ref 0.3–1.2)
Total Protein: 5.7 g/dL — ABNORMAL LOW (ref 6.5–8.1)

## 2021-01-18 LAB — MAGNESIUM: Magnesium: 2 mg/dL (ref 1.7–2.4)

## 2021-01-18 LAB — LIPID PANEL
Cholesterol: 157 mg/dL (ref 0–200)
HDL: 45 mg/dL (ref >40)
LDL Cholesterol: 99 mg/dL (ref 0–99)
Total CHOL/HDL Ratio: 3.5 RATIO
Triglycerides: 66 mg/dL (ref <150)
VLDL: 13 mg/dL (ref 0–40)

## 2021-01-18 LAB — HEPARIN LEVEL (UNFRACTIONATED): Heparin Unfractionated: 0.4 IU/mL (ref 0.30–0.70)

## 2021-01-18 SURGERY — LEFT HEART CATH AND CORONARY ANGIOGRAPHY
Anesthesia: Moderate Sedation

## 2021-01-18 MED ORDER — LIDOCAINE HCL 1 % IJ SOLN
INTRAMUSCULAR | Status: AC
  Filled 2021-01-18: qty 20

## 2021-01-18 MED ORDER — HEPARIN SODIUM (PORCINE) 1000 UNIT/ML IJ SOLN
INTRAMUSCULAR | Status: DC | PRN
  Administered 2021-01-18: 4000 [IU] via INTRAVENOUS

## 2021-01-18 MED ORDER — AMLODIPINE BESYLATE 5 MG PO TABS
2.5000 mg | ORAL_TABLET | Freq: Every day | ORAL | Status: DC
  Administered 2021-01-19: 09:00:00 2.5 mg via ORAL
  Filled 2021-01-18: qty 1

## 2021-01-18 MED ORDER — SODIUM CHLORIDE 0.9 % WEIGHT BASED INFUSION: 1.0000 mL/kg/h | INTRAVENOUS | Status: AC

## 2021-01-18 MED ORDER — HEPARIN SODIUM (PORCINE) 1000 UNIT/ML IJ SOLN
INTRAMUSCULAR | Status: AC
  Filled 2021-01-18: qty 10

## 2021-01-18 MED ORDER — HEPARIN (PORCINE) IN NACL 1000-0.9 UT/500ML-% IV SOLN
INTRAVENOUS | Status: AC
  Filled 2021-01-18: qty 1000

## 2021-01-18 MED ORDER — TECHNETIUM TO 99M ALBUMIN AGGREGATED
10.7000 | Freq: Once | INTRAVENOUS | Status: AC | PRN
  Administered 2021-01-18: 11:00:00 10.7 via INTRAVENOUS

## 2021-01-18 MED ORDER — SODIUM CHLORIDE 0.9 % IV SOLN: 250.0000 mL | INTRAVENOUS | Status: DC | PRN

## 2021-01-18 MED ORDER — MIDAZOLAM HCL 2 MG/2ML IJ SOLN
INTRAMUSCULAR | Status: AC
  Filled 2021-01-18: qty 2

## 2021-01-18 MED ORDER — IOHEXOL 300 MG/ML  SOLN
INTRAMUSCULAR | Status: DC | PRN
  Administered 2021-01-18: 17:00:00 72 mL

## 2021-01-18 MED ORDER — HEPARIN SODIUM (PORCINE) 5000 UNIT/ML IJ SOLN
5000.0000 [IU] | Freq: Three times a day (TID) | INTRAMUSCULAR | Status: DC
  Administered 2021-01-18 – 2021-01-19 (×2): 5000 [IU] via SUBCUTANEOUS
  Filled 2021-01-18 (×2): qty 1

## 2021-01-18 MED ORDER — SODIUM CHLORIDE 0.9% FLUSH: 3.0000 mL | INTRAVENOUS | Status: DC | PRN

## 2021-01-18 MED ORDER — ACETAMINOPHEN 325 MG PO TABS: 650.0000 mg | ORAL_TABLET | ORAL | Status: DC | PRN

## 2021-01-18 MED ORDER — VERAPAMIL HCL 2.5 MG/ML IV SOLN
INTRAVENOUS | Status: DC | PRN
  Administered 2021-01-18: 2.5 mg via INTRA_ARTERIAL

## 2021-01-18 MED ORDER — LABETALOL HCL 5 MG/ML IV SOLN: 10.0000 mg | INTRAVENOUS | Status: AC | PRN

## 2021-01-18 MED ORDER — HEPARIN BOLUS VIA INFUSION: 2500.0000 [IU] | Freq: Once | INTRAVENOUS | Status: DC

## 2021-01-18 MED ORDER — HYDRALAZINE HCL 20 MG/ML IJ SOLN: 10.0000 mg | INTRAMUSCULAR | Status: AC | PRN

## 2021-01-18 MED ORDER — ASPIRIN 81 MG PO CHEW
CHEWABLE_TABLET | ORAL | Status: AC
  Filled 2021-01-18: qty 1

## 2021-01-18 MED ORDER — SODIUM CHLORIDE 0.9 % WEIGHT BASED INFUSION
3.0000 mL/kg/h | INTRAVENOUS | Status: DC
  Administered 2021-01-18: 16:00:00 3 mL/kg/h via INTRAVENOUS

## 2021-01-18 MED ORDER — ONDANSETRON HCL 4 MG/2ML IJ SOLN: 4.0000 mg | Freq: Four times a day (QID) | INTRAMUSCULAR | Status: DC | PRN

## 2021-01-18 MED ORDER — FENTANYL CITRATE (PF) 100 MCG/2ML IJ SOLN
INTRAMUSCULAR | Status: AC
  Filled 2021-01-18: qty 2

## 2021-01-18 MED ORDER — LIDOCAINE HCL (PF) 1 % IJ SOLN
INTRAMUSCULAR | Status: DC | PRN
  Administered 2021-01-18: 2 mL

## 2021-01-18 MED ORDER — VERAPAMIL HCL 2.5 MG/ML IV SOLN
INTRAVENOUS | Status: AC
  Filled 2021-01-18: qty 2

## 2021-01-18 MED ORDER — SODIUM CHLORIDE 0.9 % WEIGHT BASED INFUSION: 1.0000 mL/kg/h | INTRAVENOUS | Status: DC

## 2021-01-18 MED ORDER — ASPIRIN 81 MG PO CHEW
CHEWABLE_TABLET | ORAL | Status: AC
  Administered 2021-01-18: 15:00:00 81 mg via ORAL
  Filled 2021-01-18: qty 1

## 2021-01-18 MED ORDER — SODIUM CHLORIDE 0.9% FLUSH
3.0000 mL | Freq: Two times a day (BID) | INTRAVENOUS | Status: DC
  Administered 2021-01-18 – 2021-01-19 (×3): 3 mL via INTRAVENOUS

## 2021-01-18 MED ORDER — TECHNETIUM TC 99M TETROFOSMIN IV KIT
30.0000 | PACK | Freq: Once | INTRAVENOUS | Status: AC | PRN
  Administered 2021-01-18: 12:00:00 32.3 via INTRAVENOUS

## 2021-01-18 MED ORDER — ASPIRIN 81 MG PO CHEW: 81.0000 mg | CHEWABLE_TABLET | ORAL | Status: AC

## 2021-01-18 MED ORDER — HEPARIN (PORCINE) IN NACL 1000-0.9 UT/500ML-% IV SOLN
INTRAVENOUS | Status: DC | PRN
  Administered 2021-01-18: 1000 mL

## 2021-01-18 MED ORDER — CHLORHEXIDINE GLUCONATE CLOTH 2 % EX PADS
6.0000 | MEDICATED_PAD | Freq: Every day | CUTANEOUS | Status: DC
  Administered 2021-01-18: 14:00:00 6 via TOPICAL

## 2021-01-18 SURGICAL SUPPLY — 11 items
CATH 5FR JL3.5 JR4 ANG PIG MP (CATHETERS) ×2 IMPLANT
DEVICE RAD TR BAND REGULAR (VASCULAR PRODUCTS) ×2 IMPLANT
DRAPE BRACHIAL (DRAPES) ×2 IMPLANT
GLIDESHEATH SLEND SS 6F .021 (SHEATH) ×2 IMPLANT
GUIDEWIRE INQWIRE 1.5J.035X260 (WIRE) IMPLANT
INQWIRE 1.5J .035X260CM (WIRE) ×3 IMPLANT
KIT SYRINGE INJ CVI SPIKEX1 (MISCELLANEOUS) ×2 IMPLANT
PACK CARDIAC CATH (CUSTOM PROCEDURE TRAY) ×3 IMPLANT
PROTECTION STATION PRESSURIZED (MISCELLANEOUS) ×3 IMPLANT
SET ATX SIMPLICITY (MISCELLANEOUS) ×2 IMPLANT
STATION PROTECTION PRESSURIZED (MISCELLANEOUS) IMPLANT

## 2021-01-18 NOTE — Progress Notes (Signed)
PROGRESS NOTE   Shawn Andrews  HYI:[id number] DOB: 1928/03/02 DOA: 01/17/2021 PCP: Ria Bush, MD  Brief Narrative:  87 year old white male HFrEF EF with NYHA class II-III symptoms 40% secondary to CAD--had recent cardiac cath 10/07/2020 PCI of proximal mid LAD with 2 overlapping DES CKD 3 AA Reflux HLD Essential tremor Left leg DVT not on anticoagulation Recent COVID 11/17 recovered well  Developed chest pain shortness of breath X 7 days BNP 793 troponin 65 and trending upwards CT chest moderate left and right effusions patchy areas of nodular density consolidation left lower lobe  Cardiology consulted-placed on Iv heparin per them  Patient started on diuretics although antibiotics were held from admit  Hospital-Problem based course  NSTEMI in the setting of prior CAD and LAD stenting 09/2020 Non specific EKG changes on admit, Troponin rise in a setting of self described "pressure" in chest x 3 weeks Myoview performed and pending For Cardiac Cath per cardiology today--it appears patient was on aspirin 81 3 times a week and not daily on admission--Plavix 75 resumed Rest as per cardiology-cont imdur 30, cozaar 50, metoprolol 25 bid, Crestor 5 daily [not allergic to statin?] Heparin GTT held as some hematuria Acute exacerbation HFrEF--from #1 Diuresing with Lasix 40 IV twice daily, daily weights, labs a.m., Reds vest readings Rest as per cardiology BPH with hematuria Some red-tinged urine earlier today-Foley removed as per cardiology Continue finasteride 5 daily, doxazosin 4 daily Essential tremor followed by neurology Continue primidone 100 mg 3 times daily Hyponatremia hypervolemic, CKD 3 Monitor with diuresis  DVT prophylaxis: Prophylactic Heparin Code Status: DNR Family Communication: Discussed with wife at bedside Disposition:  Status is: Inpatient  Remains inpatient appropriate because: Still sick       Consultants:  Cardiology  Procedures:  Myoview  test pending Cardiac cath pending  Antimicrobials:     Subjective: States feels fair no chest pain no pressure No shortness of breath Just back from Myoview  Objective: Vitals:   01/17/21 1800 01/17/21 2000 01/17/21 2201 01/18/21 0721  BP: (!) 134/48 130/69 (!) 145/54 129/70  Pulse: (!) 30 94 (!) 45 80  Resp: 20 19 20 18   Temp:  99 F (37.2 C) 98 F (36.7 C) 98.1 F (36.7 C)  TempSrc:  Oral Oral Oral  SpO2: 95% 93% 94% 93%  Weight:      Height:        Intake/Output Summary (Last 24 hours) at 01/18/2021 1328 Last data filed at 01/18/2021 0950 Gross per 24 hour  Intake 482.92 ml  Output 1500 ml  Net -1017.08 ml   Filed Weights   01/16/21 1656  Weight: 84.8 kg    Examination:  EOMI NCAT cannot appreciate JVD S1-S2 no murmur lung clear sherrill sinus rhythm but T wave inversions noted Abdomen soft no rebound no guarding ROM intact CTA B no rales no rhonchi Neck soft supple  Data Reviewed: personally reviewed   CBC    Component Value Date/Time   WBC 7.1 01/18/2021 0703   RBC 3.54 (L) 01/18/2021 0703   HGB 10.2 (L) 01/18/2021 0703   HCT 29.9 (L) 01/18/2021 0703   PLT 272 01/18/2021 0703   MCV 84.5 01/18/2021 0703   MCH 28.8 01/18/2021 0703   MCHC 34.1 01/18/2021 0703   RDW 14.4 01/18/2021 0703   LYMPHSABS 1.2 01/18/2021 0703   MONOABS 0.7 01/18/2021 0703   EOSABS 0.2 01/18/2021 0703   BASOSABS 0.0 01/18/2021 0703   CMP Latest Ref Rng & Units 01/18/2021 01/16/2021 12/13/2020  Glucose 70 - 99 mg/dL 128(H) 120(H) 160(H)  BUN 8 - 23 mg/dL 23 25(H) 25(H)  Creatinine 0.61 - 1.24 mg/dL 1.36(H) 1.24 1.38  Sodium 135 - 145 mmol/L 130(L) 132(L) 135  Potassium 3.5 - 5.1 mmol/L 3.9 4.6 4.3  Chloride 98 - 111 mmol/L 99 100 103  CO2 22 - 32 mmol/L 25 26 26   Calcium 8.9 - 10.3 mg/dL 8.5(L) 8.7(L) 9.2  Total Protein 6.5 - 8.1 g/dL 5.7(L) - 6.0(L)  Total Bilirubin 0.3 - 1.2 mg/dL 0.8 - -  Alkaline Phos 38 - 126 U/L 67 - -  AST 15 - 41 U/L 18 - -  ALT 0 - 44 U/L  13 - -     Radiology Studies: DG Chest 2 View  Result Date: 01/16/2021 CLINICAL DATA:  Chest pain for a few weeks worse with activity EXAM: CHEST - 2 VIEW COMPARISON:  November 10, 2017 FINDINGS: The heart size and mediastinal contours are within normal limits. Aortic atherosclerosis. Chronic bronchitic lung changes. Anterior left pleural thickening versus loculated effusion. Left basilar airspace opacity. Thoracic spondylosis. IMPRESSION: Anterior left pleural thickening versus loculated effusion with a left basilar airspace opacity which may reflect atelectasis or infiltrate. Electronically Signed   By: Dahlia Bailiff M.D.   On: 01/16/2021 17:33   CT Chest Wo Contrast  Result Date: 01/17/2021 CLINICAL DATA:  Respiratory illness. EXAM: CT CHEST WITHOUT CONTRAST TECHNIQUE: Multidetector CT imaging of the chest was performed following the standard protocol without IV contrast. COMPARISON:  Chest radiograph dated 01/16/2021 and CT dated 11/10/2017. FINDINGS: Evaluation of this exam is limited in the absence of intravenous contrast. Cardiovascular: Borderline cardiomegaly. No significant pericardial effusion. Advanced 3 vessel coronary vascular calcification. There is moderate atherosclerotic calcification of the thoracic aorta. No aneurysmal dilatation. The central pulmonary arteries are grossly unremarkable. Mediastinum/Nodes: No hilar or mediastinal adenopathy. The esophagus is grossly unremarkable. No mediastinal fluid collection. Lungs/Pleura: Moderate left and small right pleural effusions. There is partial compressive atelectasis of the left lower lobe. There are however patchy areas of nodular density and consolidative changes involving the left lower lobe and lingula most concerning for pneumonia or aspiration. Clinical correlation and follow-up to resolution recommended. No pneumothorax. The central airways are patent. Small calcified granuloma along the right minor fissure. Upper Abdomen: Diffuse  colonic diverticulosis. Bilateral renal hypodense lesions, incompletely characterized, possibly cysts. Musculoskeletal: Osteopenia with degenerative changes of the spine. No acute osseous pathology. IMPRESSION: 1. Moderate left and small right pleural effusions with partial compressive atelectasis of the left lower lobe. 2. Patchy areas of nodular density and consolidative changes involving the left lower lobe and lingula most concerning for pneumonia or aspiration. 3. Aortic Atherosclerosis (ICD10-I70.0). Electronically Signed   By: Anner Crete M.D.   On: 01/17/2021 03:53     Scheduled Meds:  aspirin EC  81 mg Oral Q M,W,F   Chlorhexidine Gluconate Cloth  6 each Topical Q0600   cholecalciferol  1,000 Units Oral Daily   clopidogrel  75 mg Oral Daily   doxazosin  4 mg Oral Daily   famotidine  20 mg Oral QHS   finasteride  5 mg Oral Daily   furosemide  40 mg Intravenous Q12H   isosorbide mononitrate  30 mg Oral Daily   losartan  50 mg Oral Daily   melatonin  10 mg Oral QHS   metoprolol tartrate  25 mg Oral BID   primidone  100 mg Oral TID   sodium chloride flush  3 mL Intravenous Q12H   [  START ON 01/23/2021] vitamin B-12  500 mcg Oral Weekly   Continuous Infusions:   LOS: 1 day   Time spent: [facility], MD [facility] To contact the attending provider between 7A-7P or the covering provider during after hours 7P-7A, please log into the web site [email or web address] and access using universal [place] password for that web site. If you do not have the password, please call the hospital operator.  01/18/2021, 1:28 PM

## 2021-01-18 NOTE — Consult Note (Signed)
[facility] Cardiology Consultation Note  Patient ID: Shawn Andrews, MRN: [id number], DOB/AGE: 85-Oct-1930 85 y.o. Admit date: 01/17/2021   Date of Consult: 01/18/2021 Primary Physician: Ria Bush, MD Primary Cardiologist: Nehemiah Massed  Chief Complaint:  Chief Complaint  Patient presents with   Chest Pain   Reason for Consult:  Chest pain  HPI: 87 y.o. male with known coronary artery disease status post PCI and stent placement of LAD earlier this year and some atherosclerosis of the left circumflex best treated medically by cardiac catheterization and some diffuse right coronary artery atherosclerosis.  The patient has had significant concerns recent progression of chest discomfort when he does physical activity.  This is progressive over the last several days possibly consistent with coronary artery atherosclerosis progression.  When seen in the emergency room the patient did have an EKG showing normal sinus rhythm left atrial enlargement with nonspecific ST and T wave changes.  He does have chronic kidney disease with a glomerular filtration rate of 55.  Troponin level has changed with a troponin of 81/146/162.  He has had appropriate treatment of his cardiovascular issues with appropriate medication management including hypertension and hyperlipidemia treatment.  Echocardiogram recently has shown that the patient had an ejection fraction of 40% with hypokinesis of the septal wall.  He has been without chest pain since admission but there is concerns that the patient has not had any activities.  Past Medical History:  Diagnosis Date   Abnormal drug screen 06/2014   inapprop neg xanax rpt 3 mo (06/2014)   BPH (benign prostatic hypertrophy) 01/21/98   has had 3 biopsies in past ([facility]) decided to stop PSA/DRE   Hyperlipidemia 01/21/02   Hypertension 05/22/03   Left lumbar radiculopathy    Osteoarthritis 01/22/88   knees, lumbar spondylosis and listhesis      Surgical History:  Past  Surgical History:  Procedure Laterality Date   CATARACT EXTRACTION  2013   bilateral   ESI Left 08/2014, 11/2014, 08/2015   L S1, L L5/S1 transforaminal ESI; L4/5 L5/S1 zygapophysial injections, L S1 transforaminal ([facility])   FINGER SURGERY     right middle, partial traumatic amputation   KNEE ARTHROSCOPY  1990   right   KNEE SURGERY  04/2006   right partial knee replacement in [place] - rec ppx abx for any invasive procedure     Home Meds: Prior to Admission medications   Medication Sig Start Date End Date Taking? Authorizing Provider  Cholecalciferol (VITAMIN D3) 25 MCG (1000 UT) CAPS Take 1 capsule (1,000 Units total) by mouth daily. 08/25/18  Yes Ria Bush, MD  clopidogrel (PLAVIX) 75 MG tablet Take 1 tablet by mouth daily. 10/10/20  Yes [provider]  Coenzyme Q10 (CO Q-10) 100 MG CAPS Take 1 capsule by mouth daily. 12/21/20  Yes Ria Bush, MD  furosemide (LASIX) 20 MG tablet Take 1 tablet by mouth every other day. 10/09/20  Yes [provider]  isosorbide mononitrate (IMDUR) 30 MG 24 hr tablet Take 1 tablet by mouth daily. 10/10/20  Yes [provider]  vitamin B-12 (CYANOCOBALAMIN) 500 MCG tablet Take 1 tablet (500 mcg total) by mouth once a week. 08/30/20  Yes Ria Bush, MD  acetaminophen (TYLENOL 8 HOUR) 650 MG CR tablet Take 1 tablet (650 mg total) by mouth 2 (two) times a day. 06/24/18   Ria Bush, MD  aspirin (ASPIRIN 81) 81 MG EC tablet Take 1 tablet (81 mg total) by mouth every Monday, Wednesday, and Friday. 08/30/20  Ria Bush, MD  atorvastatin (LIPITOR) 40 MG tablet Take 1 tablet by mouth at bedtime. Patient not taking: Reported on 01/17/2021 10/09/20   [provider]  clindamycin (CLEOCIN) 150 MG capsule Take 4 capsules (600 mg total) by mouth as directed. Prior to dental procedures 10/16/20   Ria Bush, MD  docusate sodium (COLACE) 100 MG capsule Take 1 capsule (100 mg total) by mouth daily.  12/13/20   Ria Bush, MD  doxazosin (CARDURA) 4 MG tablet TAKE 1 TABLET BY MOUTH  DAILY 11/17/19   Ria Bush, MD  famotidine (PEPCID) 20 MG tablet Take 1 tablet (20 mg total) by mouth at bedtime. 08/30/20   Ria Bush, MD  finasteride (PROSCAR) 5 MG tablet TAKE 1 TABLET BY MOUTH  DAILY 11/17/19   Ria Bush, MD  loratadine (CLARITIN) 10 MG tablet Take 1 tablet (10 mg total) by mouth daily as needed for allergies. 12/22/12   Ria Bush, MD  losartan (COZAAR) 50 MG tablet Take 1 tablet (50 mg total) by mouth daily. 12/13/20   Ria Bush, MD  metoprolol tartrate (LOPRESSOR) 50 MG tablet Take 0.5 tablets (25 mg total) by mouth 2 (two) times daily. 10/16/20   Ria Bush, MD  nitroGLYCERIN (NITROSTAT) 0.4 MG SL tablet Place under the tongue. 10/09/20   [provider]  polyethylene glycol powder (GLYCOLAX/MIRALAX) 17 GM/SCOOP powder Take 8.5-17 g by mouth daily as needed for moderate constipation. 12/13/20   Ria Bush, MD  primidone (MYSOLINE) 50 MG tablet Take 2 tablets (100 mg total) by mouth 3 (three) times daily. 10/16/20   Ria Bush, MD  rOPINIRole (REQUIP) 0.5 MG tablet Take 1 tablet (0.5 mg total) by mouth in the morning and at bedtime. Patient not taking: Reported on 01/17/2021 08/25/19   Ria Bush, MD  rosuvastatin (CRESTOR) 5 MG tablet Take 5 mg by mouth daily. Patient not taking: Reported on 01/17/2021 12/21/20 12/21/21  [provider]    Inpatient Medications:   aspirin EC  81 mg Oral Q M,W,F   Chlorhexidine Gluconate Cloth  6 each Topical Q0600   cholecalciferol  1,000 Units Oral Daily   clopidogrel  75 mg Oral Daily   doxazosin  4 mg Oral Daily   famotidine  20 mg Oral QHS   finasteride  5 mg Oral Daily   furosemide  40 mg Intravenous Q12H   isosorbide mononitrate  30 mg Oral Daily   losartan  50 mg Oral Daily   melatonin  10 mg Oral QHS   metoprolol tartrate  25 mg Oral BID   primidone  100 mg  Oral TID   [START ON 01/23/2021] vitamin B-12  500 mcg Oral Weekly    heparin 1,400 Units/hr (01/17/21 1943)    Allergies:  Allergies  Allergen Reactions   Penicillins Swelling    Of tongue Has patient had a PCN reaction causing immediate rash, facial/tongue/throat swelling, SOB or lightheadedness with hypotension: Yes Has patient had a PCN reaction causing severe rash involving mucus membranes or skin necrosis: No Has patient had a PCN reaction that required hospitalization: No Has patient had a PCN reaction occurring within the last 10 years: No If all of the above answers are "NO", then may proceed with Cephalosporin use.   Cialis [Tadalafil] Other (See Comments)    Indigestion and "felt like crap"   Lipitor [Atorvastatin] Other (See Comments)    Severe myalgias   Lisinopril Cough    Dry nagging cough    Social History   Socioeconomic History  Marital status: Married    Spouse name: Not on file   Number of children: Not on file   Years of education: Not on file   Highest education level: Not on file  Occupational History   Occupation: retired Event organiser, Facilities manager, owns lawn care business  Tobacco Use   Smoking status: Former    Packs/day: 0.00    Years: 0.00    Pack years: 0.00    Types: Pipe, Cigarettes    Quit date: 12/26/2000    Years since quitting: 20.0   Smokeless tobacco: Current    Types: Chew   Tobacco comments:    quit over 30 years ago  Vaping Use   Vaping Use: Never used  Substance and Sexual Activity   Alcohol use: Yes    Alcohol/week: 0.0 standard drinks    Comment: Occasional beer   Drug use: No   Sexual activity: Not on file  Other Topics Concern   Not on file  Social History Narrative   Widowed, remarried 07/27/2007.   Daughter deceased from metastatic melanoma   Lives at [facility]   Occupation: retired Event organiser then [employer]    Activity: walks 1.5 miles 3d/wk   Diet: good water, fruits/vegetables  daily   Social Determinants of Radio broadcast assistant Strain: Not on file  Food Insecurity: Not on file  Transportation Needs: Not on file  Physical Activity: Not on file  Stress: Not on file  Social Connections: Not on file  Intimate Partner Violence: Not on file     Family History  Problem Relation Age of Onset   Hypertension Mother    Heart disease Brother 26       MI   Diabetes Brother    Cancer Daughter        melanoma   Stroke Neg Hx      Review of Systems Positive for chest pain shortness of breath Negative for: General:  chills, fever, night sweats or weight changes.  Cardiovascular: PND orthopnea syncope dizziness  Dermatological skin lesions rashes Respiratory: Cough congestion Urologic: Frequent urination urination at night and hematuria Abdominal: negative for nausea, vomiting, diarrhea, bright red blood per rectum, melena, or hematemesis Neurologic: negative for visual changes, and/or hearing changes  All other systems reviewed and are otherwise negative except as noted above.  Labs: No results for input(s): CKTOTAL, CKMB, TROPONINI in the last 72 hours. Lab Results  Component Value Date   WBC 6.7 01/16/2021   HGB 10.4 (L) 01/16/2021   HCT 31.0 (L) 01/16/2021   MCV 87.3 01/16/2021   PLT 322 01/16/2021    Recent Labs  Lab 01/16/21 1507  NA 132*  K 4.6  CL 100  CO2 26  BUN 25*  CREATININE 1.24  CALCIUM 8.7*  GLUCOSE 120*   Lab Results  Component Value Date   CHOL 212 (H) 08/25/2020   HDL 49.80 08/25/2020   LDLCALC 138 (H) 08/25/2020   TRIG 119.0 08/25/2020   No results found for: DDIMER  Radiology/Studies:  DG Chest 2 View  Result Date: 01/16/2021 CLINICAL DATA:  Chest pain for a few weeks worse with activity EXAM: CHEST - 2 VIEW COMPARISON:  November 10, 2017 FINDINGS: The heart size and mediastinal contours are within normal limits. Aortic atherosclerosis. Chronic bronchitic lung changes. Anterior left pleural thickening versus  loculated effusion. Left basilar airspace opacity. Thoracic spondylosis. IMPRESSION: Anterior left pleural thickening versus loculated effusion with a left basilar airspace opacity which may reflect atelectasis or  infiltrate. Electronically Signed   By: Dahlia Bailiff M.D.   On: 01/16/2021 17:33   CT Chest Wo Contrast  Result Date: 01/17/2021 CLINICAL DATA:  Respiratory illness. EXAM: CT CHEST WITHOUT CONTRAST TECHNIQUE: Multidetector CT imaging of the chest was performed following the standard protocol without IV contrast. COMPARISON:  Chest radiograph dated 01/16/2021 and CT dated 11/10/2017. FINDINGS: Evaluation of this exam is limited in the absence of intravenous contrast. Cardiovascular: Borderline cardiomegaly. No significant pericardial effusion. Advanced 3 vessel coronary vascular calcification. There is moderate atherosclerotic calcification of the thoracic aorta. No aneurysmal dilatation. The central pulmonary arteries are grossly unremarkable. Mediastinum/Nodes: No hilar or mediastinal adenopathy. The esophagus is grossly unremarkable. No mediastinal fluid collection. Lungs/Pleura: Moderate left and small right pleural effusions. There is partial compressive atelectasis of the left lower lobe. There are however patchy areas of nodular density and consolidative changes involving the left lower lobe and lingula most concerning for pneumonia or aspiration. Clinical correlation and follow-up to resolution recommended. No pneumothorax. The central airways are patent. Small calcified granuloma along the right minor fissure. Upper Abdomen: Diffuse colonic diverticulosis. Bilateral renal hypodense lesions, incompletely characterized, possibly cysts. Musculoskeletal: Osteopenia with degenerative changes of the spine. No acute osseous pathology. IMPRESSION: 1. Moderate left and small right pleural effusions with partial compressive atelectasis of the left lower lobe. 2. Patchy areas of nodular density and  consolidative changes involving the left lower lobe and lingula most concerning for pneumonia or aspiration. 3. Aortic Atherosclerosis (ICD10-I70.0). Electronically Signed   By: Anner Crete M.D.   On: 01/17/2021 03:53    EKG: Normal sinus rhythm with left atrial enlargement and nonspecific ST changes  Weights: Filed Weights   01/16/21 1656  Weight: 84.8 kg     Physical Exam: Blood pressure (!) 145/54, pulse (!) 45, temperature 98 F (36.7 C), temperature source Oral, resp. rate 20, height 5\' 10"  (1.778 m), weight 84.8 kg, SpO2 94 %. Body mass index is 26.83 kg/m. General: Well developed, well nourished, in no acute distress. Head eyes ears nose throat: Normocephalic, atraumatic, sclera non-icteric, no xanthomas, nares are without discharge. No apparent thyromegaly and/or mass  Lungs: Normal respiratory effort.  no wheezes, no rales, no rhonchi.  Heart: RRR with normal S1 S2. no murmur gallop, no rub, PMI is normal size and placement, carotid upstroke normal without bruit, jugular venous pressure is normal Abdomen: Soft, non-tender, non-distended with normoactive bowel sounds. No hepatomegaly. No rebound/guarding. No obvious abdominal masses. Abdominal aorta is normal size without bruit Extremities: No edema. no cyanosis, no clubbing, no ulcers  Peripheral : 2+ bilateral upper extremity pulses, 2+ bilateral femoral pulses, 2+ bilateral dorsal pedal pulse Neuro: Alert and oriented. No facial asymmetry. No focal deficit. Moves all extremities spontaneously. Musculoskeletal: Normal muscle tone without kyphosis Psych:  Responds to questions appropriately with a normal affect.    Assessment: 87 year old male with known coronary artery disease status post PCI and stent placement of left anterior descending artery circumflex artery which was medically managed and right coronary artery of unknown significance having chest discomfort and slight elevation of troponin, possibly consistent with  unstable angina although at 87 years old may need risk management.  Plan: 1.  Continue current medication management for risk management of cardiovascular disease 2.  Nitrates for chest discomfort 3.  Plan for walking treadmill test to assess significance of chest discomfort.  If patient has significant chest discomfort even with ambulation will consider proceeding to cardiac catheterization to assess coronary anatomy and possible restenosis of  LAD stent.  Patient understands risk and benefits of cardiac catheterization.  This includes a possibility of death stroke heart attack infection bleeding or blood clot.  He is at low risk for conscious sedation  Signed, Corey Skains M.D. [facility] 01/18/2021, 6:48 AM

## 2021-01-18 NOTE — Progress Notes (Signed)
°  Transition of Care [facility]) Screening Note   Patient Details  Name: Shawn Andrews Date of Birth: [birth year]   Transition of Care [facility]) CM/SW Contact:    Alberteen Sam, LCSW Phone Number: 01/18/2021, 9:54 AM  TOC consulted for heart failure screen, messaged heart failure RN Jimsey to inform.  Transition of Care Department [facility]) has reviewed patient and no TOC needs have been identified at this time. We will continue to monitor patient advancement through interdisciplinary progression rounds. If new patient transition needs arise, please place a TOC consult.

## 2021-01-18 NOTE — Progress Notes (Signed)
[facility] for heparin infusion Indication: NSTEMI w/ chest pressure  Allergies  Allergen Reactions   Penicillins Swelling    Of tongue Has patient had a PCN reaction causing immediate rash, facial/tongue/throat swelling, SOB or lightheadedness with hypotension: Yes Has patient had a PCN reaction causing severe rash involving mucus membranes or skin necrosis: No Has patient had a PCN reaction that required hospitalization: No Has patient had a PCN reaction occurring within the last 10 years: No If all of the above answers are "NO", then may proceed with Cephalosporin use.   Cialis [Tadalafil] Other (See Comments)    Indigestion and "felt like crap"   Lipitor [Atorvastatin] Other (See Comments)    Severe myalgias   Lisinopril Cough    Dry nagging cough    Patient Measurements: Height: 5\' 10"  (177.8 cm) Weight: 84.8 kg (187 lb) IBW/kg (Calculated) : 73 Heparin Dosing Weight: 84.8 kg  Vital Signs: Temp: 98.1 F (36.7 C) (12/29 0721) Temp Source: Oral (12/29 0721) BP: 129/70 (12/29 0721) Pulse Rate: 80 (12/29 0721)  Labs: Recent Labs    01/16/21 1507 01/17/21 0034 01/17/21 0419 01/17/21 1054 01/17/21 1220 01/17/21 1223 01/17/21 2215 01/18/21 0703 01/18/21 0707  HGB 10.4*  --   --   --   --   --   --  10.2*  --   HCT 31.0*  --   --   --   --   --   --  29.9*  --   PLT 322  --   --   --   --   --   --  272  --   APTT  --   --  31  --   --   --   --   --   --   LABPROT  --   --  14.6  --   --   --   --   --   --   INR  --   --  1.1  --   --   --   --   --   --   HEPARINUNFRC  --   --   --   --   --  0.13* 0.34  --  0.40  CREATININE 1.24  --   --   --   --   --   --   --  1.36*  TROPONINIHS 65* 81*  --  146* 162*  --   --   --   --      Estimated Creatinine Clearance: 37.3 mL/min (A) (by C-G formula based on SCr of 1.36 mg/dL (H)).   Medical History: Past Medical History:  Diagnosis Date   Abnormal drug screen 06/2014    inapprop neg xanax rpt 3 mo (06/2014)   BPH (benign prostatic hypertrophy) 01/21/98   has had 3 biopsies in past ([facility]) decided to stop PSA/DRE   Hyperlipidemia 01/21/02   Hypertension 05/22/03   Left lumbar radiculopathy    Osteoarthritis 01/22/88   knees, lumbar spondylosis and listhesis   Assessment: Pt is 87 yo male w/ h/o CHF, presenting to ED c/o worsening SOB found with slightly elevated Troponin I, trending up slowly. Pharmacy has been consulted for heparin dosing/monitoring.  12/28 1223 HL 0.13, subtherapeutic 12/28 2215 HL 0.34, therapeutic x 1 12/29 0707 HL 0.4  Goal of Therapy:  Heparin level 0.3-0.7 units/ml Monitor platelets by anticoagulation protocol: Yes   Plan:  Heparin level is therapeutic. Will continue heparin  infusion at 1400 units/hr. Recheck heparin level and CBC with AM labs.    Eleonore Chiquito, PharmD, 01/18/2021 8:35 AM

## 2021-01-18 NOTE — Progress Notes (Signed)
[facility] Cardiology [facility] Encounter Note  Patient: Shawn Andrews / Admit Date: 01/17/2021 / Date of Encounter: 01/18/2021, 5:05 PM   Subjective: Patient overall did well from yesterday with no evidence of further chest discomfort without ambulation.  Troponin peaked at 162 without evidence of acute coronary syndrome.  Stress test shows shows isolated anterolateral myocardial perfusion defect consistent with mild ischemia  Cardiac catheterization showing patent stent of left anterior descending artery with moderate stenosis of second diagonal where appeared ischemia shows up on stress test Mild atherosclerosis of circumflex artery Diffuse disease of right coronary artery which appears not to be significantly changed from previous cardiac catheterization  Cardiology team discussion suggest medical management at this time and defer any other intervention unless further symptoms are significant  Review of Systems: Positive for: Shortness of breath Negative for: Vision change, hearing change, syncope, dizziness, nausea, vomiting,diarrhea, bloody stool, stomach pain, cough, congestion, diaphoresis, urinary frequency, urinary pain,skin lesions, skin rashes Others previously listed  Objective: Telemetry: Normal sinus rhythm Physical Exam: Blood pressure (!) 125/58, pulse (!) 40, temperature 98.1 F (36.7 C), temperature source Oral, resp. rate (!) 25, height 5\' 10"  (1.778 m), weight 84.8 kg, SpO2 95 %. Body mass index is 26.83 kg/m. General: Well developed, well nourished, in no acute distress. Head: Normocephalic, atraumatic, sclera non-icteric, no xanthomas, nares are without discharge. Neck: No apparent masses Lungs: Normal respirations with no wheezes, no rhonchi, no rales , no crackles   Heart: Regular rate and rhythm, normal S1 S2, no murmur, no rub, no gallop, PMI is normal size and placement, carotid upstroke normal without bruit, jugular venous pressure normal Abdomen: Soft,  non-tender, non-distended with normoactive bowel sounds. No hepatosplenomegaly. Abdominal aorta is normal size without bruit Extremities: No edema, no clubbing, no cyanosis, no ulcers,  Peripheral: 2+ radial, 2+ femoral, 2+ dorsal pedal pulses Neuro: Alert and oriented. Moves all extremities spontaneously. Psych:  Responds to questions appropriately with a normal affect.   Intake/Output Summary (Last 24 hours) at 01/18/2021 1705 Last data filed at 01/18/2021 0950 Gross per 24 hour  Intake 482.92 ml  Output 1500 ml  Net -1017.08 ml    Inpatient Medications:   amLODipine  2.5 mg Oral Daily   aspirin       [MAR Hold] aspirin EC  81 mg Oral Q M,W,F   [MAR Hold] Chlorhexidine Gluconate Cloth  6 each Topical Q0600   [MAR Hold] cholecalciferol  1,000 Units Oral Daily   [MAR Hold] clopidogrel  75 mg Oral Daily   [MAR Hold] doxazosin  4 mg Oral Daily   [MAR Hold] famotidine  20 mg Oral QHS   [MAR Hold] finasteride  5 mg Oral Daily   [MAR Hold] furosemide  40 mg Intravenous Q12H   [MAR Hold] heparin injection (subcutaneous)  5,000 Units Subcutaneous Q8H   [MAR Hold] isosorbide mononitrate  30 mg Oral Daily   [MAR Hold] losartan  50 mg Oral Daily   [MAR Hold] melatonin  10 mg Oral QHS   [MAR Hold] metoprolol tartrate  25 mg Oral BID   [MAR Hold] primidone  100 mg Oral TID   [MAR Hold] sodium chloride flush  3 mL Intravenous Q12H   [MAR Hold] vitamin B-12  500 mcg Oral Weekly   Infusions:   sodium chloride     [START ON 01/19/2021] sodium chloride 3 mL/kg/hr (01/18/21 1545)   Followed by   Derrill Memo ON 01/19/2021] sodium chloride      Labs: Recent Labs  01/16/21 1507 01/18/21 0707  NA 132* 130*  K 4.6 3.9  CL 100 99  CO2 26 25  GLUCOSE 120* 128*  BUN 25* 23  CREATININE 1.24 1.36*  CALCIUM 8.7* 8.5*  MG  --  2.0   Recent Labs    01/18/21 0707  AST 18  ALT 13  ALKPHOS 67  BILITOT 0.8  PROT 5.7*  ALBUMIN 3.2*   Recent Labs    01/16/21 1507 01/18/21 0703  WBC 6.7  7.1  NEUTROABS  --  5.0  HGB 10.4* 10.2*  HCT 31.0* 29.9*  MCV 87.3 84.5  PLT 322 272   No results for input(s): CKTOTAL, CKMB, TROPONINI in the last 72 hours. Invalid input(s): POCBNP Recent Labs    01/17/21 1220  HGBA1C 5.0     Weights: Filed Weights   01/16/21 1656  Weight: 84.8 kg     Radiology/Studies:  DG Chest 2 View  Result Date: 01/16/2021 CLINICAL DATA:  Chest pain for a few weeks worse with activity EXAM: CHEST - 2 VIEW COMPARISON:  November 10, 2017 FINDINGS: The heart size and mediastinal contours are within normal limits. Aortic atherosclerosis. Chronic bronchitic lung changes. Anterior left pleural thickening versus loculated effusion. Left basilar airspace opacity. Thoracic spondylosis. IMPRESSION: Anterior left pleural thickening versus loculated effusion with a left basilar airspace opacity which may reflect atelectasis or infiltrate. Electronically Signed   By: Dahlia Bailiff M.D.   On: 01/16/2021 17:33   CT Chest Wo Contrast  Result Date: 01/17/2021 CLINICAL DATA:  Respiratory illness. EXAM: CT CHEST WITHOUT CONTRAST TECHNIQUE: Multidetector CT imaging of the chest was performed following the standard protocol without IV contrast. COMPARISON:  Chest radiograph dated 01/16/2021 and CT dated 11/10/2017. FINDINGS: Evaluation of this exam is limited in the absence of intravenous contrast. Cardiovascular: Borderline cardiomegaly. No significant pericardial effusion. Advanced 3 vessel coronary vascular calcification. There is moderate atherosclerotic calcification of the thoracic aorta. No aneurysmal dilatation. The central pulmonary arteries are grossly unremarkable. Mediastinum/Nodes: No hilar or mediastinal adenopathy. The esophagus is grossly unremarkable. No mediastinal fluid collection. Lungs/Pleura: Moderate left and small right pleural effusions. There is partial compressive atelectasis of the left lower lobe. There are however patchy areas of nodular density and  consolidative changes involving the left lower lobe and lingula most concerning for pneumonia or aspiration. Clinical correlation and follow-up to resolution recommended. No pneumothorax. The central airways are patent. Small calcified granuloma along the right minor fissure. Upper Abdomen: Diffuse colonic diverticulosis. Bilateral renal hypodense lesions, incompletely characterized, possibly cysts. Musculoskeletal: Osteopenia with degenerative changes of the spine. No acute osseous pathology. IMPRESSION: 1. Moderate left and small right pleural effusions with partial compressive atelectasis of the left lower lobe. 2. Patchy areas of nodular density and consolidative changes involving the left lower lobe and lingula most concerning for pneumonia or aspiration. 3. Aortic Atherosclerosis (ICD10-I70.0). Electronically Signed   By: Anner Crete M.D.   On: 01/17/2021 03:53   CARDIAC CATHETERIZATION  Result Date: 01/18/2021   Dist RCA lesion is 80% stenosed.   Prox RCA lesion is 50% stenosed.   Ost Cx to Prox Cx lesion is 55% stenosed.   Mid Cx lesion is 50% stenosed.   RPDA lesion is 40% stenosed.   Ost LAD to Prox LAD lesion is 40% stenosed.   Prox LAD to Mid LAD lesion is 10% stenosed.   Mid LAD to Dist LAD lesion is 45% stenosed. 87 year old male with known coronary artery disease status post non-ST elevation myocardial infarction in  May earlier this year with a PCI and stent placement left anterior descending artery having progression of chest discomfort and minimal elevation of troponin Stress test shows significant symptoms with preventricular contractions and chest discomfort stopping at 2 minutes with an isolated reversible defect of the anterolateral wall consistent with possible diagonal 2 stenoses Left ventricle shows global LV systolic dysfunction ejection fraction of 40% by echocardiogram Patent stent to proximal to mid LAD with other diffuse disease in distal LAD and diagonal 2 Mild diffuse disease  of circumflex obtuse marginal Moderate to severe disease with calcification of proximal and distal right coronary artery Discussion with cardiology team suggesting medical management at this time due to above.  If further significant symptoms occur would consider the possibility of further assessment and intervention of distal right coronary artery Plan Continue dual antiplatelet therapy from previous PCI and stent placement earlier this year Continuation of high intensity cholesterol therapy Addition of nitrates calcium channel blocker for better hypertension control and anginal symptoms Cardiac rehabilitation   NM Myocar Multi W/Spect W/Wall Motion / EF  Result Date: 01/18/2021   Findings are consistent with ischemia. The study is high risk.   No ST deviation was noted.   Left ventricular function is normal. End diastolic cavity size is normal.   Prior study not available for comparison.     Assessment and Recommendation  87 y.o. male with known coronary artery disease hypertension hyperlipidemia on appropriate medications after a non-ST elevation myocardial infarction earlier this year with progressive anginal symptoms and no current evidence of acute coronary syndrome and a stress test suggesting isolated anterolateral myocardial perfusion defect consistent with diagonal artery which is small and not amenable to PCI needing further medical management 1.  Maximize medical management and anginal treatment for current possible isolated small diagonal ischemia and follow-up for improvements.  This includes beta-blocker nitrates and calcium channel blocker 2.  High intensity cholesterol therapy 3.  Dual antiplatelet therapy from previous non-STEMI earlier this year 4.  Cardiac rehabilitation 5.  If ambulating well in the morning doing well with no further significant symptoms okay for discharge home with further medication management as an outpatient  Signed, Serafina Royals M.D. FACC

## 2021-01-18 NOTE — Progress Notes (Signed)
[facility] for heparin infusion Indication: NSTEMI w/ chest pressure  Allergies  Allergen Reactions   Penicillins Swelling    Of tongue Has patient had a PCN reaction causing immediate rash, facial/tongue/throat swelling, SOB or lightheadedness with hypotension: Yes Has patient had a PCN reaction causing severe rash involving mucus membranes or skin necrosis: No Has patient had a PCN reaction that required hospitalization: No Has patient had a PCN reaction occurring within the last 10 years: No If all of the above answers are "NO", then may proceed with Cephalosporin use.   Cialis [Tadalafil] Other (See Comments)    Indigestion and "felt like crap"   Lipitor [Atorvastatin] Other (See Comments)    Severe myalgias   Lisinopril Cough    Dry nagging cough    Patient Measurements: Height: 5\' 10"  (177.8 cm) Weight: 84.8 kg (187 lb) IBW/kg (Calculated) : 73 Heparin Dosing Weight: 84.8 kg  Vital Signs: Temp: 98 F (36.7 C) (12/28 2201) Temp Source: Oral (12/28 2201) BP: 145/54 (12/28 2201) Pulse Rate: 45 (12/28 2201)  Labs: Recent Labs    01/16/21 1507 01/17/21 0034 01/17/21 0419 01/17/21 1054 01/17/21 1220 01/17/21 1223 01/17/21 2215  HGB 10.4*  --   --   --   --   --   --   HCT 31.0*  --   --   --   --   --   --   PLT 322  --   --   --   --   --   --   APTT  --   --  31  --   --   --   --   LABPROT  --   --  14.6  --   --   --   --   INR  --   --  1.1  --   --   --   --   HEPARINUNFRC  --   --   --   --   --  0.13* 0.34  CREATININE 1.24  --   --   --   --   --   --   TROPONINIHS 65* 81*  --  146* 162*  --   --      Estimated Creatinine Clearance: 40.9 mL/min (by C-G formula based on SCr of 1.24 mg/dL).   Medical History: Past Medical History:  Diagnosis Date   Abnormal drug screen 06/2014   inapprop neg xanax rpt 3 mo (06/2014)   BPH (benign prostatic hypertrophy) 01/21/98   has had 3 biopsies in past ([facility]) decided to  stop PSA/DRE   Hyperlipidemia 01/21/02   Hypertension 05/22/03   Left lumbar radiculopathy    Osteoarthritis 01/22/88   knees, lumbar spondylosis and listhesis   Assessment: Pt is 87 yo male w/ h/o CHF, presenting to ED c/o worsening SOB found with slightly elevated Troponin I, trending up slowly. Pharmacy has been consulted for heparin dosing/monitoring.  12/28 1223 HL 0.13, subtherapeutic 12/28 2215 HL 0.34, therapeutic x 1  Goal of Therapy:  Heparin level 0.3-0.7 units/ml Monitor platelets by anticoagulation protocol: Yes   Plan:  Continue heparin infusion rate at 1400 units/hr Recheck HL w/ AM labs to confirm CBC daily while on heparin  Renda Rolls, PharmD, [facility] 01/18/2021 12:55 AM

## 2021-01-18 NOTE — Consult Note (Addendum)
° °  Heart Failure Nurse Navigator Note  HFrEF 40%  He presented to the emergency room with complaints of chest pressure, shortness of breath, lower extremity edema and positive JVD.  Chest x-ray revealed moderate left pleural effusion and small right.  Comorbidities:  Coronary artery disease with stenting Hyperlipidemia Hypertension GERD Chronic kidney disease stage III Tremor  Medications:  Aspirin 81 mg daily Plavix 75 mg daily Cardura 4 mg daily Furosemide 40 mg IV every 12 Isosorbide mononitrate 30 daily Losartan 50 mg daily Metoprolol tartrate 25 mg 2 times a day  Labs:  Sodium 130, potassium 3.9, chloride 99, CO2 25, BUN 23, creatinine 1.36.  BNP on admission was 793.  Magnesium 2, total cholesterol 157, triglycerides 66, HDL 45, LDL 82, Intake 362 mL Output 2400 mL Weight not done today  Initial meeting with patient and wife was at the bedside.  Patient had just recently returned from stress testing ,they are going to proceed with left heart catheterization.  He states heart failure had not been explained to him before.  He lives at home with his wife of 15 years, they are active with friends.   Patient states that he does have a scale at home but admits to not weighing himself daily.  Explained the importance of first thing in the morning when he gets up to weigh himself, either in the nude or with the same amount of clothes and to record.  Also discussed  zone magnet and recording what zone he is and whether it is green-yellow or red.  Went over signs and symptoms to report also.  Explained his ejection fraction and how that correlates to a normal ejection fraction.  They voiced understanding.  Wife states that he had not been taking his Lasix on a daily basis.  Because he did not like having to pee all the time.  Discussed the different times that he could take the medication based on his activities.  Discussed eating healthy.  Wife states that they no longer eat  in restaurants.  They have removed the saltshaker from the table but that he still cooked with it.  Discussed eating healthy with fresh fruits, vegetables or frozen as opposed to canned and processed foods.  Also discussed using natural spices or Mrs. Dash.  Pricilla Riffle RN CHFN

## 2021-01-18 NOTE — Hospital Course (Addendum)
admission

## 2021-01-19 ENCOUNTER — Encounter: Payer: Self-pay | Admitting: Internal Medicine

## 2021-01-19 DIAGNOSIS — I5023 Acute on chronic systolic (congestive) heart failure: Secondary | ICD-10-CM | POA: Diagnosis not present

## 2021-01-19 LAB — CBC
HCT: 29.6 % — ABNORMAL LOW (ref 39.0–52.0)
Hemoglobin: 9.8 g/dL — ABNORMAL LOW (ref 13.0–17.0)
MCH: 28.3 pg (ref 26.0–34.0)
MCHC: 33.1 g/dL (ref 30.0–36.0)
MCV: 85.5 fL (ref 80.0–100.0)
Platelets: 276 10*3/uL (ref 150–400)
RBC: 3.46 MIL/uL — ABNORMAL LOW (ref 4.22–5.81)
RDW: 14.6 % (ref 11.5–15.5)
WBC: 5.6 10*3/uL (ref 4.0–10.5)
nRBC: 0 % (ref 0.0–0.2)

## 2021-01-19 LAB — COMPREHENSIVE METABOLIC PANEL
ALT: 12 U/L (ref 0–44)
AST: 16 U/L (ref 15–41)
Albumin: 3.1 g/dL — ABNORMAL LOW (ref 3.5–5.0)
Alkaline Phosphatase: 65 U/L (ref 38–126)
Anion gap: 9 (ref 5–15)
BUN: 30 mg/dL — ABNORMAL HIGH (ref 8–23)
CO2: 26 mmol/L (ref 22–32)
Calcium: 8.6 mg/dL — ABNORMAL LOW (ref 8.9–10.3)
Chloride: 101 mmol/L (ref 98–111)
Creatinine, Ser: 1.37 mg/dL — ABNORMAL HIGH (ref 0.61–1.24)
GFR, Estimated: 49 mL/min — ABNORMAL LOW (ref >60)
Glucose, Bld: 107 mg/dL — ABNORMAL HIGH (ref 70–99)
Potassium: 3.9 mmol/L (ref 3.5–5.1)
Sodium: 136 mmol/L (ref 135–145)
Total Bilirubin: 0.8 mg/dL (ref 0.3–1.2)
Total Protein: 5.5 g/dL — ABNORMAL LOW (ref 6.5–8.1)

## 2021-01-19 MED ORDER — PHENAZOPYRIDINE HCL 100 MG PO TABS
100.0000 mg | ORAL_TABLET | Freq: Once | ORAL | Status: AC
  Administered 2021-01-19: 03:00:00 100 mg via ORAL
  Filled 2021-01-19: qty 1

## 2021-01-19 MED ORDER — AMLODIPINE BESYLATE 2.5 MG PO TABS: 2.5000 mg | ORAL_TABLET | Freq: Every day | ORAL | 12 refills | Status: DC

## 2021-01-19 MED ORDER — FUROSEMIDE 10 MG/ML IJ SOLN: 40.0000 mg | Freq: Two times a day (BID) | INTRAMUSCULAR | Status: DC

## 2021-01-19 NOTE — Care Management Important Message (Signed)
Important Message  Patient Details  Name: Shawn Andrews MRN: [id number] Date of Birth: 1928/05/12   Medicare Important Message Given:  Yes     Dannette Barbara 01/19/2021, 11:44 AM

## 2021-01-19 NOTE — Progress Notes (Signed)
Discharge instructions explained to pt and pts family/ verbalized an understanding/ ivs and tele removed/ pt discharged with foley/ will f/u outpt with urology/  will transport off unit via wheelchair

## 2021-01-19 NOTE — Discharge Summary (Addendum)
Physician Discharge Summary  Shawn Andrews:[id number] DOB: [birth year] DOA: 01/17/2021  PCP: Ria Bush, MD  Admit date: 01/17/2021 Discharge date: 01/19/2021  Time spent: 35 minutes  Recommendations for Outpatient Follow-up:  Needs Chem-12 CBC in 1 week Outpatient close cardiology input Needs OP Urology input for retention  Discharge Diagnoses:  MAIN problem for hospitalization   NSTEMI  Please see below for itemized issues addressed in [place]- refer to other progress notes for clarity if needed  Discharge Condition: Improved  Diet recommendation: Heart healthy  Filed Weights   01/16/21 1656 01/19/21 0344  Weight: 84.8 kg 83.1 kg    History of present illness:  87 year old white male HFrEF EF with NYHA class II-III symptoms 40% secondary to CAD--had recent cardiac cath 10/07/2020 PCI of proximal mid LAD with 2 overlapping DES CKD 3 AA Reflux HLD Essential tremor Left leg DVT not on anticoagulation Recent COVID 11/17 recovered well   Developed chest pain shortness of breath X 7 days BNP 793 troponin 65 and trending upwards CT chest moderate left and right effusions patchy areas of nodular density consolidation left lower lobe   Cardiology consulted-placed on Iv heparin per them   Patient started on diuretics although antibiotics were held from admit  Hospital Course:  NSTEMI in the setting of prior CAD and LAD stenting 09/2020 Non specific EKG changes on admit, Troponin rise in a setting of self described "pressure" in chest x 3 weeks Myoview performed and showed some inferolateral ischemia however cath was deferred cont imdur 30, cozaar 50, metoprolol 25 bid, Crestor 5 daily [not allergic to statin?] Zotepine was added amlodipine added this admission admission this Heparin GTT held as some hematuria Acute exacerbation HFrEF--from #1 Diuresing with Lasix 40 IV twice daily, daily weights Weight came down to baseline we will continue Lasix 20 daily on  discharge BPH with hematuria Some red-tinged urine earlier today-Foley removed as per cardiology Continue finasteride 5 daily, doxazosin 4 daily Had some retention and may need to go home with Foley catheter and teaching will have to be done Essential tremor followed by neurology Continue primidone 100 mg 3 times daily Hyponatremia hypervolemic, CKD 3 Monitor with diuresis   Discharge Exam: Vitals:   01/19/21 0326 01/19/21 0729  BP: 111/67 125/83  Pulse: 76 75  Resp: 18 18  Temp: (!) 97.5 F (36.4 C) 97.8 F (36.6 C)  SpO2: 93% 91%    Subj on day of d/c   Awake coherent no distress  General Exam on discharge  EOMI NCAT no focal deficit No rales no rhonchi Quite hard of hearing Abdomen soft no rebound ROM intact Power 5/5  Discharge Instructions   Discharge Instructions     AMB Referral to Cardiac Rehabilitation - Phase II   Complete by: As directed    Diagnosis: Stable Angina   Diet - low sodium heart healthy   Complete by: As directed    Discharge instructions   Complete by: As directed    Make sure u look at your meds carefully--Amlodipine has been added Please take ur statin as this is necessary for u to prevent further heart issues You will need close follow-up with cardiology in the outpatient setting   Increase activity slowly   Complete by: As directed       Allergies as of 01/19/2021       Reactions   Penicillins Swelling   Of tongue Has patient had a PCN reaction causing immediate rash, facial/tongue/throat swelling, SOB or lightheadedness with hypotension:  Yes Has patient had a PCN reaction causing severe rash involving mucus membranes or skin necrosis: No Has patient had a PCN reaction that required hospitalization: No Has patient had a PCN reaction occurring within the last 10 years: No If all of the above answers are "NO", then may proceed with Cephalosporin use.   Cialis [tadalafil] Other (See Comments)   Indigestion and "felt like  crap"   Lipitor [atorvastatin] Other (See Comments)   Severe myalgias   Lisinopril Cough   Dry nagging cough        Medication List     STOP taking these medications    atorvastatin 40 MG tablet Commonly known as: LIPITOR   clindamycin 150 MG capsule Commonly known as: CLEOCIN   rOPINIRole 0.5 MG tablet Commonly known as: REQUIP       TAKE these medications    acetaminophen 650 MG CR tablet Commonly known as: Tylenol 8 Hour Take 1 tablet (650 mg total) by mouth 2 (two) times a day.   amLODipine 2.5 MG tablet Commonly known as: NORVASC Take 1 tablet (2.5 mg total) by mouth daily. Start taking on: January 20, 2021   aspirin 81 MG EC tablet Commonly known as: Aspirin 81 Take 1 tablet (81 mg total) by mouth every Monday, Wednesday, and Friday.   clopidogrel 75 MG tablet Commonly known as: PLAVIX Take 1 tablet by mouth daily.   Co Q-10 100 MG Caps Take 1 capsule by mouth daily.   docusate sodium 100 MG capsule Commonly known as: Colace Take 1 capsule (100 mg total) by mouth daily.   doxazosin 4 MG tablet Commonly known as: CARDURA TAKE 1 TABLET BY MOUTH  DAILY   famotidine 20 MG tablet Commonly known as: Pepcid Take 1 tablet (20 mg total) by mouth at bedtime.   finasteride 5 MG tablet Commonly known as: PROSCAR TAKE 1 TABLET BY MOUTH  DAILY   furosemide 20 MG tablet Commonly known as: LASIX Take 1 tablet by mouth every other day.   isosorbide mononitrate 30 MG 24 hr tablet Commonly known as: IMDUR Take 1 tablet by mouth daily.   loratadine 10 MG tablet Commonly known as: CLARITIN Take 1 tablet (10 mg total) by mouth daily as needed for allergies.   losartan 50 MG tablet Commonly known as: COZAAR Take 1 tablet (50 mg total) by mouth daily.   metoprolol tartrate 50 MG tablet Commonly known as: LOPRESSOR Take 0.5 tablets (25 mg total) by mouth 2 (two) times daily.   nitroGLYCERIN 0.4 MG SL tablet Commonly known as: NITROSTAT Place under  the tongue.   polyethylene glycol powder 17 GM/SCOOP powder Commonly known as: GLYCOLAX/MIRALAX Take 8.5-17 g by mouth daily as needed for moderate constipation.   primidone 50 MG tablet Commonly known as: MYSOLINE Take 2 tablets (100 mg total) by mouth 3 (three) times daily.   rosuvastatin 5 MG tablet Commonly known as: CRESTOR Take 5 mg by mouth daily.   vitamin B-12 500 MCG tablet Commonly known as: CYANOCOBALAMIN Take 1 tablet (500 mcg total) by mouth once a week.   Vitamin D3 25 MCG (1000 UT) Caps Take 1 capsule (1,000 Units total) by mouth daily.       Allergies  Allergen Reactions   Penicillins Swelling    Of tongue Has patient had a PCN reaction causing immediate rash, facial/tongue/throat swelling, SOB or lightheadedness with hypotension: Yes Has patient had a PCN reaction causing severe rash involving mucus membranes or skin necrosis: No Has patient had  a PCN reaction that required hospitalization: No Has patient had a PCN reaction occurring within the last 10 years: No If all of the above answers are "NO", then may proceed with Cephalosporin use.   Cialis [Tadalafil] Other (See Comments)    Indigestion and "felt like crap"   Lipitor [Atorvastatin] Other (See Comments)    Severe myalgias   Lisinopril Cough    Dry nagging cough    Follow-up Information     Corey Skains, MD Follow up in 1 week(s).   Specialty: Cardiology Contact information: [address] [facility] [place] [address] [phone]                  The results of significant diagnostics from this hospitalization (including imaging, microbiology, ancillary and laboratory) are listed below for reference.    Significant Diagnostic Studies: DG Chest 2 View  Result Date: 01/16/2021 CLINICAL DATA:  Chest pain for a few weeks worse with activity EXAM: CHEST - 2 VIEW COMPARISON:  November 10, 2017 FINDINGS: The heart size and mediastinal contours are  within normal limits. Aortic atherosclerosis. Chronic bronchitic lung changes. Anterior left pleural thickening versus loculated effusion. Left basilar airspace opacity. Thoracic spondylosis. IMPRESSION: Anterior left pleural thickening versus loculated effusion with a left basilar airspace opacity which may reflect atelectasis or infiltrate. Electronically Signed   By: Dahlia Bailiff M.D.   On: 01/16/2021 17:33   CT Chest Wo Contrast  Result Date: 01/17/2021 CLINICAL DATA:  Respiratory illness. EXAM: CT CHEST WITHOUT CONTRAST TECHNIQUE: Multidetector CT imaging of the chest was performed following the standard protocol without IV contrast. COMPARISON:  Chest radiograph dated 01/16/2021 and CT dated 11/10/2017. FINDINGS: Evaluation of this exam is limited in the absence of intravenous contrast. Cardiovascular: Borderline cardiomegaly. No significant pericardial effusion. Advanced 3 vessel coronary vascular calcification. There is moderate atherosclerotic calcification of the thoracic aorta. No aneurysmal dilatation. The central pulmonary arteries are grossly unremarkable. Mediastinum/Nodes: No hilar or mediastinal adenopathy. The esophagus is grossly unremarkable. No mediastinal fluid collection. Lungs/Pleura: Moderate left and small right pleural effusions. There is partial compressive atelectasis of the left lower lobe. There are however patchy areas of nodular density and consolidative changes involving the left lower lobe and lingula most concerning for pneumonia or aspiration. Clinical correlation and follow-up to resolution recommended. No pneumothorax. The central airways are patent. Small calcified granuloma along the right minor fissure. Upper Abdomen: Diffuse colonic diverticulosis. Bilateral renal hypodense lesions, incompletely characterized, possibly cysts. Musculoskeletal: Osteopenia with degenerative changes of the spine. No acute osseous pathology. IMPRESSION: 1. Moderate left and small right  pleural effusions with partial compressive atelectasis of the left lower lobe. 2. Patchy areas of nodular density and consolidative changes involving the left lower lobe and lingula most concerning for pneumonia or aspiration. 3. Aortic Atherosclerosis (ICD10-I70.0). Electronically Signed   By: Anner Crete M.D.   On: 01/17/2021 03:53   CARDIAC CATHETERIZATION  Result Date: 01/18/2021   Dist RCA lesion is 80% stenosed.   Prox RCA lesion is 50% stenosed.   Ost Cx to Prox Cx lesion is 55% stenosed.   Mid Cx lesion is 50% stenosed.   RPDA lesion is 40% stenosed.   Ost LAD to Prox LAD lesion is 40% stenosed.   Prox LAD to Mid LAD lesion is 10% stenosed.   Mid LAD to Dist LAD lesion is 45% stenosed. 87 year old male with known coronary artery disease status post non-ST elevation myocardial infarction in May earlier this year with a  PCI and stent placement left anterior descending artery having progression of chest discomfort and minimal elevation of troponin Stress test shows significant symptoms with preventricular contractions and chest discomfort stopping at 2 minutes with an isolated reversible defect of the anterolateral wall consistent with possible diagonal 2 stenoses Left ventricle shows global LV systolic dysfunction ejection fraction of 40% by echocardiogram Patent stent to proximal to mid LAD with other diffuse disease in distal LAD and diagonal 2 Mild diffuse disease of circumflex obtuse marginal Moderate to severe disease with calcification of proximal and distal right coronary artery Discussion with cardiology team suggesting medical management at this time due to above.  If further significant symptoms occur would consider the possibility of further assessment and intervention of distal right coronary artery Plan Continue dual antiplatelet therapy from previous PCI and stent placement earlier this year Continuation of high intensity cholesterol therapy Addition of nitrates calcium channel blocker  for better hypertension control and anginal symptoms Cardiac rehabilitation   NM Myocar Multi W/Spect W/Wall Motion / EF  Result Date: 01/18/2021   Findings are consistent with ischemia. The study is high risk.   No ST deviation was noted.   Left ventricular function is normal. End diastolic cavity size is normal.   Prior study not available for comparison.    Microbiology: Recent Results (from the past 240 hour(s))  Resp Panel by RT-PCR (Flu A&B, Covid) Nasopharyngeal Swab     Status: None   Collection Time: 01/17/21 12:34 AM   Specimen: Nasopharyngeal Swab; Nasopharyngeal(NP) swabs in vial transport medium  Result Value Ref Range Status   SARS Coronavirus 2 by RT PCR NEGATIVE NEGATIVE Final    Comment: (NOTE) SARS-CoV-2 target nucleic acids are NOT DETECTED.  The SARS-CoV-2 RNA is generally detectable in upper respiratory specimens during the acute phase of infection. The lowest concentration of SARS-CoV-2 viral copies this assay can detect is 138 copies/mL. A negative result does not preclude SARS-Cov-2 infection and should not be used as the sole basis for treatment or other patient management decisions. A negative result may occur with  improper specimen collection/handling, submission of specimen other than nasopharyngeal swab, presence of viral mutation(s) within the areas targeted by this assay, and inadequate number of viral copies(<138 copies/mL). A negative result must be combined with clinical observations, patient history, and epidemiological information. The expected result is Negative.  Fact Sheet for Patients:  EntrepreneurPulse.com.au  Fact Sheet for Healthcare Providers:  IncredibleEmployment.be  This test is no t yet approved or cleared by the [place] FDA and  has been authorized for detection and/or diagnosis of SARS-CoV-2 by FDA under an Emergency Use Authorization (EUA). This EUA will remain  in effect (meaning this  test can be used) for the duration of the COVID-19 declaration under Section 564(b)(1) of the Act, 21 U.S.C.section 360bbb-3(b)(1), unless the authorization is terminated  or revoked sooner.       Influenza A by PCR NEGATIVE NEGATIVE Final   Influenza B by PCR NEGATIVE NEGATIVE Final    Comment: (NOTE) The Xpert Xpress SARS-CoV-2/FLU/RSV plus assay is intended as an aid in the diagnosis of influenza from Nasopharyngeal swab specimens and should not be used as a sole basis for treatment. Nasal washings and aspirates are unacceptable for Xpert Xpress SARS-CoV-2/FLU/RSV testing.  Fact Sheet for Patients: EntrepreneurPulse.com.au  Fact Sheet for Healthcare Providers: IncredibleEmployment.be  This test is not yet approved or cleared by the [place] FDA and has been authorized for detection and/or diagnosis of SARS-CoV-2 by FDA  under an Emergency Use Authorization (EUA). This EUA will remain in effect (meaning this test can be used) for the duration of the COVID-19 declaration under Section 564(b)(1) of the Act, 21 U.S.C. section 360bbb-3(b)(1), unless the authorization is terminated or revoked.  Performed at [facility], [address] [place], [place] [address]      Labs: Basic Metabolic Panel: Recent Labs  Lab 01/16/21 1507 01/18/21 0707 01/19/21 0645  NA 132* 130* 136  K 4.6 3.9 3.9  CL 100 99 101  CO2 26 25 26   GLUCOSE 120* 128* 107*  BUN 25* 23 30*  CREATININE 1.24 1.36* 1.37*  CALCIUM 8.7* 8.5* 8.6*  MG  --  2.0  --    Liver Function Tests: Recent Labs  Lab 01/18/21 0707 01/19/21 0645  AST 18 16  ALT 13 12  ALKPHOS 67 65  BILITOT 0.8 0.8  PROT 5.7* 5.5*  ALBUMIN 3.2* 3.1*   No results for input(s): LIPASE, AMYLASE in the last 168 hours. No results for input(s): AMMONIA in the last 168 hours. CBC: Recent Labs  Lab 01/16/21 1507 01/18/21 0703 01/19/21 0645  WBC 6.7 7.1 5.6  NEUTROABS  --  5.0   --   HGB 10.4* 10.2* 9.8*  HCT 31.0* 29.9* 29.6*  MCV 87.3 84.5 85.5  PLT 322 272 276   Cardiac Enzymes: No results for input(s): CKTOTAL, CKMB, CKMBINDEX, TROPONINI in the last 168 hours. BNP: BNP (last 3 results) Recent Labs    01/16/21 1727  BNP 793.0*    ProBNP (last 3 results) No results for input(s): PROBNP in the last 8760 hours.  CBG: Recent Labs  Lab 01/17/21 2202  GLUCAP 125*       Signed:  Nita Sells MD   [facility] 01/19/2021, 10:48 AM

## 2021-01-19 NOTE — Progress Notes (Signed)
Cross Cover Patient with difficulty urinating and bladder spasms. He required in and out cath to drain bladder. Creatinine slightly rising with review of labs. Hematuria appears some improved, as blood is old and not frank. Pyridium for bladder spasms. Hold am lasix for decreased urine output

## 2021-01-19 NOTE — Progress Notes (Signed)
Pt unable to urinate/ failed voiding trail/ bladder scan 266mls/ MD aware/  orders to insert foley cath/ foley inserted/ tolerated well/ pts wife educated on care of foley cath/ verbalized an understanding

## 2021-01-21 ENCOUNTER — Other Ambulatory Visit: Payer: Self-pay | Admitting: Family Medicine

## 2021-01-23 NOTE — Progress Notes (Signed)
01/24/21 2:37 PM   Shawn Andrews 03-Feb-1928 [id number]  Referring provider:  Ria Bush, MD [address] [place],  [place] [address] Chief Complaint  Patient presents with   Urinary Retention     HPI: Shawn Andrews is a 88 y.o.male who presents today for further evaluation of groin pain and urinary retention.   He has a personal history of CKD stage IIIA and BPH.   During visit with Dr.Gutierrez on 12/22/2020 he was having ongoing stinging and burning during urination. He left a urine specimen that showed no growth urinalysis showed small Hgb, large leukocytes, 3-6 RBCs. He was prescribed ciprofloxacin and treated as prostatitis the dose was extended for 2 weeks for ongoing symptoms.   Associated urine culture was negative.  He did not necessarily improve on these antibiotics.  He is currently on chronic finasteride and Cardura 4 mg.  He was seen in the ED on 01/17/2021 for shortness of breath and chest pain. During this admission he developed urinary retention and had a foley with red tinged urine he was on heparin at the time. He foley was discharged with Foley after essentially failing a voiding trial.  He reports that during his infection his only symptom was that he only voided a little.   He has no personal history of retention prior to this.  It does look like he has a personal history of elevated PSA and underwent multiple biopsies in [place] in the past.  He since elected to stop PSA screening.  No recent PSAs.  He underwent a voiding trial here in our clinic this morning.  He was instilled with fluid but unable to void.  He was sent home, drink fluids throughout the day and return later in the afternoon.  He was still unable to void and thus ultimately elected to have his catheter replaced.   PMH: Past Medical History:  Diagnosis Date   Abnormal drug screen 06/2014   inapprop neg xanax rpt 3 mo (06/2014)   BPH (benign prostatic hypertrophy) 01/21/98    has had 3 biopsies in past ([facility]) decided to stop PSA/DRE   Hyperlipidemia 01/21/02   Hypertension 05/22/03   Left lumbar radiculopathy    Osteoarthritis 01/22/88   knees, lumbar spondylosis and listhesis    Surgical History: Past Surgical History:  Procedure Laterality Date   CATARACT EXTRACTION  2013   bilateral   ESI Left 08/2014, 11/2014, 08/2015   L S1, L L5/S1 transforaminal ESI; L4/5 L5/S1 zygapophysial injections, L S1 transforaminal ([facility])   FINGER SURGERY     right middle, partial traumatic amputation   KNEE ARTHROSCOPY  1990   right   KNEE SURGERY  04/2006   right partial knee replacement in [place] - rec ppx abx for any invasive procedure   LEFT HEART CATH AND CORONARY ANGIOGRAPHY N/A 01/18/2021   Procedure: LEFT HEART CATH AND CORONARY ANGIOGRAPHY;  Surgeon: Corey Skains, MD;  Location: [facility];  Service: Cardiovascular;  Laterality: N/A;    Home Medications:  Allergies as of 01/24/2021       Reactions   Penicillins Swelling   Of tongue Has patient had a PCN reaction causing immediate rash, facial/tongue/throat swelling, SOB or lightheadedness with hypotension: Yes Has patient had a PCN reaction causing severe rash involving mucus membranes or skin necrosis: No Has patient had a PCN reaction that required hospitalization: No Has patient had a PCN reaction occurring within the last 10 years: No If all of the  above answers are "NO", then may proceed with Cephalosporin use.   Cialis [tadalafil] Other (See Comments)   Indigestion and "felt like crap"   Lipitor [atorvastatin] Other (See Comments)   Severe myalgias   Lisinopril Cough   Dry nagging cough        Medication List        Accurate as of January 24, 2021  2:37 PM. If you have any questions, ask your nurse or doctor.          acetaminophen 650 MG CR tablet Commonly known as: Tylenol 8 Hour Take 1 tablet (650 mg total) by mouth 2 (two) times a day.   amLODipine 2.5 MG  tablet Commonly known as: NORVASC Take 1 tablet (2.5 mg total) by mouth daily.   aspirin 81 MG EC tablet Commonly known as: Aspirin 81 Take 1 tablet (81 mg total) by mouth every Monday, Wednesday, and Friday.   clopidogrel 75 MG tablet Commonly known as: PLAVIX Take 1 tablet by mouth daily.   Co Q-10 100 MG Caps Take 1 capsule by mouth daily.   docusate sodium 100 MG capsule Commonly known as: Colace Take 1 capsule (100 mg total) by mouth daily.   doxazosin 4 MG tablet Commonly known as: CARDURA TAKE 1 TABLET BY MOUTH  DAILY   famotidine 20 MG tablet Commonly known as: PEPCID TAKE 1 TABLET BY MOUTH AT  BEDTIME   finasteride 5 MG tablet Commonly known as: PROSCAR TAKE 1 TABLET BY MOUTH  DAILY   furosemide 20 MG tablet Commonly known as: LASIX Take 1 tablet by mouth every other day.   isosorbide mononitrate 30 MG 24 hr tablet Commonly known as: IMDUR Take 1 tablet by mouth daily.   loratadine 10 MG tablet Commonly known as: CLARITIN Take 1 tablet (10 mg total) by mouth daily as needed for allergies.   losartan 50 MG tablet Commonly known as: COZAAR Take 1 tablet (50 mg total) by mouth daily.   metoprolol tartrate 50 MG tablet Commonly known as: LOPRESSOR Take 0.5 tablets (25 mg total) by mouth 2 (two) times daily.   nitroGLYCERIN 0.4 MG SL tablet Commonly known as: NITROSTAT Place under the tongue.   polyethylene glycol powder 17 GM/SCOOP powder Commonly known as: GLYCOLAX/MIRALAX Take 8.5-17 g by mouth daily as needed for moderate constipation.   primidone 50 MG tablet Commonly known as: MYSOLINE Take 2 tablets (100 mg total) by mouth 3 (three) times daily.   rosuvastatin 5 MG tablet Commonly known as: CRESTOR Take 5 mg by mouth daily.   vitamin B-12 500 MCG tablet Commonly known as: CYANOCOBALAMIN Take 1 tablet (500 mcg total) by mouth once a week.   Vitamin D3 25 MCG (1000 UT) Caps Take 1 capsule (1,000 Units total) by mouth daily.         Allergies:  Allergies  Allergen Reactions   Penicillins Swelling    Of tongue Has patient had a PCN reaction causing immediate rash, facial/tongue/throat swelling, SOB or lightheadedness with hypotension: Yes Has patient had a PCN reaction causing severe rash involving mucus membranes or skin necrosis: No Has patient had a PCN reaction that required hospitalization: No Has patient had a PCN reaction occurring within the last 10 years: No If all of the above answers are "NO", then may proceed with Cephalosporin use.   Cialis [Tadalafil] Other (See Comments)    Indigestion and "felt like crap"   Lipitor [Atorvastatin] Other (See Comments)    Severe myalgias   Lisinopril Cough  Dry nagging cough    Family History: Family History  Problem Relation Age of Onset   Hypertension Mother    Heart disease Brother 54       MI   Diabetes Brother    Cancer Daughter        melanoma   Stroke Neg Hx     Social History:  reports that he quit smoking about 20 years ago. His smoking use included pipe and cigarettes. His smokeless tobacco use includes chew. He reports current alcohol use. He reports that he does not use drugs.   Physical Exam: BP 120/61    Pulse 80    Ht 5\' 10"  (1.778 m)    Wt 171 lb (77.6 kg)    BMI 24.54 kg/m   Constitutional:  Alert and oriented, No acute distress. HEENT: [place] AT, moist mucus membranes.  Trachea midline, no masses. Cardiovascular: No clubbing, cyanosis, or edema. Respiratory: Normal respiratory effort, no increased work of breathing. Skin: No rashes, bruises or suspicious lesions. Neurologic: Grossly intact, no focal deficits, moving all 4 extremities. Psychiatric: Normal mood and affect.  Laboratory Data:  Lab Results  Component Value Date   CREATININE 1.37 (H) 01/19/2021   Lab Results  Component Value Date   PSA 4.14 (H) 06/18/2013   PSA 4.71 (H) 06/12/2012   PSA 4.87 (H) 12/24/2010   Lab Results  Component Value Date   HGBA1C 5.0  01/17/2021   Pertinent Imaging: Results for orders placed or performed in visit on 01/24/21  Bladder Scan (Post Void Residual) in office  Result Value Ref Range   Scan Result 263    Assessment & Plan:    Gross hematuria - Recommend CT urogram and cystoscopy to further evaluate hematuria and the anatomy of the bladder. Will take PSA during cystoscopy  - CT urogram; Scheduled  - Cystoscopy; Scheduled   Urinary retention / BPH with outlet obstructoin - 150 cc put into bladder he was unable to void so he returned later today - He failed voiding trial today. Recommend he continue with Foley; does not think that he would be able to self cath secondary to hand tremors -We will evaluate his bladder and prostate at the time of cystoscopy for evaluation of the above and consideration of outlet procedure, repeat voiding trial at the time of cystoscopy -Continue Cardura and finasteride, currently optimized on pharmacotherapy - He was given 1 dose course of Bactrim   Return for Return to office at 2:30pm .  Gevena Mart Littlejohn,acting as a scribe for Hollice Espy, MD.,have documented all relevant documentation on the behalf of Hollice Espy, MD,as directed by  Hollice Espy, MD while in the presence of Hollice Espy, MD.  I have reviewed the above documentation for accuracy and completeness, and I agree with the above.   Hollice Espy, MD    [facility] [address] [place], [place] [address] [phone]

## 2021-01-24 ENCOUNTER — Ambulatory Visit: Payer: Medicare Other | Admitting: Urology

## 2021-01-24 ENCOUNTER — Other Ambulatory Visit: Payer: Self-pay

## 2021-01-24 ENCOUNTER — Encounter: Payer: Self-pay | Admitting: Urology

## 2021-01-24 VITALS — BP 120/61 | HR 80 | Ht 70.0 in | Wt 171.0 lb

## 2021-01-24 DIAGNOSIS — R339 Retention of urine, unspecified: Secondary | ICD-10-CM

## 2021-01-24 DIAGNOSIS — R319 Hematuria, unspecified: Secondary | ICD-10-CM | POA: Diagnosis not present

## 2021-01-24 LAB — BLADDER SCAN AMB NON-IMAGING: Scan Result: 263

## 2021-01-24 MED ORDER — SULFAMETHOXAZOLE-TRIMETHOPRIM 800-160 MG PO TABS
1.0000 | ORAL_TABLET | Freq: Once | ORAL | Status: AC
  Administered 2021-01-24: 1 via ORAL

## 2021-01-24 NOTE — Progress Notes (Signed)
Simple Catheter Placement  Due to urinary retention patient is present today for a foley cath placement.  Patient was cleaned and prepped in a sterile fashion with betadine. A 18 FR coude foley catheter was inserted, urine return was noted  219ml, urine was yellow in color.  The balloon was filled with 10cc of sterile water.  A night bag was attached for drainage. Patient was also given a night bag to take home and was given instruction on how to change from one bag to another.  Patient was given instruction on proper catheter care.  Patient tolerated well, no complications were noted   Performed by: Verlene Mayer, CMA  Additional notes/ Follow up: Cysto appointment

## 2021-01-24 NOTE — Progress Notes (Signed)
Fill and Pull Catheter Removal  Patient is present today for a catheter removal.  Patient was cleaned and prepped in a sterile fashion 126ml of sterile water/ saline was instilled into the bladder when the patient felt the urge to urinate. 73ml of water was then drained from the balloon.  A 16FR foley cath was removed from the bladder no complications were noted .  Patient as then given some time to void on their own.  Patient cannot void their own after some time.  Patient tolerated well.  Performed by: Blondell Reveal  Follow up/ Additional notes: Return this afternoon

## 2021-01-24 NOTE — Patient Instructions (Signed)

## 2021-01-26 ENCOUNTER — Telehealth: Payer: Self-pay | Admitting: *Deleted

## 2021-01-26 NOTE — Telephone Encounter (Signed)
Patient called office to report his drainage bag has been bloody. States catheter still draining properly. Denies clots or abdominal pain. Advised if any worsening symptoms to seek medical attention at the ER.

## 2021-01-29 DIAGNOSIS — E782 Mixed hyperlipidemia: Secondary | ICD-10-CM | POA: Diagnosis not present

## 2021-01-29 DIAGNOSIS — I25118 Atherosclerotic heart disease of native coronary artery with other forms of angina pectoris: Secondary | ICD-10-CM | POA: Diagnosis not present

## 2021-01-29 DIAGNOSIS — G250 Essential tremor: Secondary | ICD-10-CM | POA: Diagnosis not present

## 2021-01-29 DIAGNOSIS — Z7689 Persons encountering health services in other specified circumstances: Secondary | ICD-10-CM | POA: Diagnosis not present

## 2021-01-29 DIAGNOSIS — I5022 Chronic systolic (congestive) heart failure: Secondary | ICD-10-CM | POA: Diagnosis not present

## 2021-01-29 DIAGNOSIS — I10 Essential (primary) hypertension: Secondary | ICD-10-CM | POA: Diagnosis not present

## 2021-01-31 ENCOUNTER — Telehealth: Payer: Medicare Other

## 2021-01-31 ENCOUNTER — Ambulatory Visit: Payer: Self-pay | Admitting: Urology

## 2021-02-01 ENCOUNTER — Ambulatory Visit: Payer: Medicare Other | Attending: Family | Admitting: Family

## 2021-02-01 ENCOUNTER — Other Ambulatory Visit: Payer: Self-pay

## 2021-02-01 ENCOUNTER — Encounter: Payer: Self-pay | Admitting: Family

## 2021-02-01 VITALS — BP 132/63 | HR 79 | Resp 18 | Ht 70.0 in | Wt 175.2 lb

## 2021-02-01 DIAGNOSIS — I110 Hypertensive heart disease with heart failure: Secondary | ICD-10-CM | POA: Diagnosis not present

## 2021-02-01 DIAGNOSIS — Z7901 Long term (current) use of anticoagulants: Secondary | ICD-10-CM | POA: Diagnosis not present

## 2021-02-01 DIAGNOSIS — N400 Enlarged prostate without lower urinary tract symptoms: Secondary | ICD-10-CM | POA: Diagnosis not present

## 2021-02-01 DIAGNOSIS — Z87891 Personal history of nicotine dependence: Secondary | ICD-10-CM | POA: Insufficient documentation

## 2021-02-01 DIAGNOSIS — M1990 Unspecified osteoarthritis, unspecified site: Secondary | ICD-10-CM | POA: Diagnosis not present

## 2021-02-01 DIAGNOSIS — I2510 Atherosclerotic heart disease of native coronary artery without angina pectoris: Secondary | ICD-10-CM | POA: Diagnosis not present

## 2021-02-01 DIAGNOSIS — N401 Enlarged prostate with lower urinary tract symptoms: Secondary | ICD-10-CM

## 2021-02-01 DIAGNOSIS — I5022 Chronic systolic (congestive) heart failure: Secondary | ICD-10-CM | POA: Insufficient documentation

## 2021-02-01 DIAGNOSIS — I10 Essential (primary) hypertension: Secondary | ICD-10-CM

## 2021-02-01 DIAGNOSIS — Z955 Presence of coronary angioplasty implant and graft: Secondary | ICD-10-CM | POA: Insufficient documentation

## 2021-02-01 DIAGNOSIS — Z79899 Other long term (current) drug therapy: Secondary | ICD-10-CM | POA: Insufficient documentation

## 2021-02-01 DIAGNOSIS — E785 Hyperlipidemia, unspecified: Secondary | ICD-10-CM | POA: Diagnosis not present

## 2021-02-01 DIAGNOSIS — R338 Other retention of urine: Secondary | ICD-10-CM

## 2021-02-01 NOTE — Progress Notes (Signed)
Patient ID: Shawn Andrews, male    DOB: 07-21-1928, 88 y.o.   MRN: [id number]  HPI  Shawn Andrews is a 88 y/o male with a history of CAD, hyperlipidemia, HTN, BPH, osteoarthritis, previous tobacco use and chronic heart failure.   Echo report from 11/14/20 reviewed and showed an EF of 40% along with moderate LAE and moderate Shawn/TR.   LHC done 01/18/21 and showed: Dist RCA lesion is 80% stenosed.   Prox RCA lesion is 50% stenosed.   Ost Cx to Prox Cx lesion is 55% stenosed.   Mid Cx lesion is 50% stenosed.   RPDA lesion is 40% stenosed.   Ost LAD to Prox LAD lesion is 40% stenosed.   Prox LAD to Mid LAD lesion is 10% stenosed.   Mid LAD to Dist LAD lesion is 45% stenosed. Patent stent to proximal to mid LAD with other diffuse disease in distal LAD and diagonal 2 Mild diffuse disease of circumflex obtuse marginal Moderate to severe disease with calcification of proximal and distal right coronary artery  Admitted 01/17/21 due to chest pain/ shortness of breath. Chest CT obtained. Placed on IV heparin drip. LHC done. Cardiology consult obtained. Initially given IV lasix with transition to oral diuretics. Discharged after 2 days.   He presents today with a chief complaint of an initial visit. Has no other symptoms and specifically denies any fatigue, cough, shortness of breath, chest pain, pedal edema, palpitations, abdominal distention, difficulty sleeping, dizziness or weight gain.   He has a foley catheter in place. Says that he's scheduled for cystoscopy next week and is hoping to get the catheter removed after that.   Keeping daily fluid intake ~64 ounces/ day.   Planning on traveling to [place] in April to visit with friends.   Past Medical History:  Diagnosis Date   Abnormal drug screen 06/22/2014   inapprop neg xanax rpt 3 mo (06/2014)   BPH (benign prostatic hypertrophy) 01/21/1998   has had 3 biopsies in past ([facility]) decided to stop PSA/DRE   CHF (congestive heart failure)  ([facility])    Coronary artery disease    Hyperlipidemia 01/21/2002   Hypertension 05/22/2003   Left lumbar radiculopathy    Osteoarthritis 01/22/1988   knees, lumbar spondylosis and listhesis   Past Surgical History:  Procedure Laterality Date   CATARACT EXTRACTION  2013   bilateral   ESI Left 08/2014, 11/2014, 08/2015   L S1, L L5/S1 transforaminal ESI; L4/5 L5/S1 zygapophysial injections, L S1 transforaminal ([facility])   FINGER SURGERY     right middle, partial traumatic amputation   KNEE ARTHROSCOPY  1990   right   KNEE SURGERY  04/2006   right partial knee replacement in [place] - rec ppx abx for any invasive procedure   LEFT HEART CATH AND CORONARY ANGIOGRAPHY N/A 01/18/2021   Procedure: LEFT HEART CATH AND CORONARY ANGIOGRAPHY;  Surgeon: Corey Skains, MD;  Location: [facility];  Service: Cardiovascular;  Laterality: N/A;   Family History  Problem Relation Age of Onset   Hypertension Mother    Heart disease Brother 85       MI   Diabetes Brother    Cancer Daughter        melanoma   Stroke Neg Hx    Social History   Tobacco Use   Smoking status: Former    Packs/day: 0.00    Years: 0.00    Pack years: 0.00    Types: Pipe, Cigarettes    Quit  date: 12/26/2000    Years since quitting: 20.1   Smokeless tobacco: Current    Types: Chew   Tobacco comments:    quit over 30 years ago  Substance Use Topics   Alcohol use: Yes    Alcohol/week: 0.0 standard drinks    Comment: Occasional beer   Allergies  Allergen Reactions   Penicillins Swelling    Of tongue Has patient had a PCN reaction causing immediate rash, facial/tongue/throat swelling, SOB or lightheadedness with hypotension: Yes Has patient had a PCN reaction causing severe rash involving mucus membranes or skin necrosis: No Has patient had a PCN reaction that required hospitalization: No Has patient had a PCN reaction occurring within the last 10 years: No If all of the above answers are "NO", then may  proceed with Cephalosporin use.   Cialis [Tadalafil] Other (See Comments)    Indigestion and "felt like crap"   Lipitor [Atorvastatin] Other (See Comments)    Severe myalgias   Lisinopril Cough    Dry nagging cough   Prior to Admission medications   Medication Sig Start Date End Date Taking? Authorizing Provider  acetaminophen (TYLENOL 8 HOUR) 650 MG CR tablet Take 1 tablet (650 mg total) by mouth 2 (two) times a day. 06/24/18  Yes Ria Bush, MD  amLODipine (NORVASC) 2.5 MG tablet Take 1 tablet (2.5 mg total) by mouth daily. 01/20/21  Yes Nita Sells, MD  Cholecalciferol (VITAMIN D3) 25 MCG (1000 UT) CAPS Take 1 capsule (1,000 Units total) by mouth daily. 08/25/18  Yes Ria Bush, MD  clopidogrel (PLAVIX) 75 MG tablet Take 1 tablet by mouth daily. 10/10/20  Yes [provider]  docusate sodium (COLACE) 100 MG capsule Take 1 capsule (100 mg total) by mouth daily. 12/13/20  Yes Ria Bush, MD  doxazosin (CARDURA) 4 MG tablet TAKE 1 TABLET BY MOUTH  DAILY 11/17/19  Yes Ria Bush, MD  famotidine (PEPCID) 20 MG tablet TAKE 1 TABLET BY MOUTH AT  BEDTIME 01/23/21  Yes Ria Bush, MD  finasteride (PROSCAR) 5 MG tablet TAKE 1 TABLET BY MOUTH  DAILY 11/17/19  Yes Ria Bush, MD  furosemide (LASIX) 20 MG tablet Take 1 tablet by mouth every other day. 10/09/20  Yes [provider]  isosorbide mononitrate (IMDUR) 30 MG 24 hr tablet Take 1 tablet by mouth daily. 10/10/20  Yes [provider]  loratadine (CLARITIN) 10 MG tablet Take 1 tablet (10 mg total) by mouth daily as needed for allergies. 12/22/12  Yes Ria Bush, MD  losartan (COZAAR) 50 MG tablet Take 1 tablet (50 mg total) by mouth daily. 12/13/20  Yes Ria Bush, MD  metoprolol tartrate (LOPRESSOR) 50 MG tablet Take 0.5 tablets (25 mg total) by mouth 2 (two) times daily. 10/16/20  Yes Ria Bush, MD  nitroGLYCERIN (NITROSTAT) 0.4 MG SL tablet Place under the  tongue. 10/09/20  Yes [provider]  polyethylene glycol powder (GLYCOLAX/MIRALAX) 17 GM/SCOOP powder Take 8.5-17 g by mouth daily as needed for moderate constipation. 12/13/20  Yes Ria Bush, MD  primidone (MYSOLINE) 50 MG tablet Take 2 tablets (100 mg total) by mouth 3 (three) times daily. 10/16/20  Yes Ria Bush, MD  rosuvastatin (CRESTOR) 5 MG tablet Take 5 mg by mouth daily. 12/21/20 12/21/21 Yes [provider]  vitamin B-12 (CYANOCOBALAMIN) 500 MCG tablet Take 1 tablet (500 mcg total) by mouth once a week. 08/30/20  Yes Ria Bush, MD  aspirin (ASPIRIN 81) 81 MG EC tablet Take 1 tablet (81 mg total) by  mouth every Monday, Wednesday, and Friday. Patient not taking: Reported on 02/01/2021 08/30/20   Ria Bush, MD  Coenzyme Q10 (CO Q-10) 100 MG CAPS Take 1 capsule by mouth daily. Patient not taking: Reported on 02/01/2021 12/21/20   Ria Bush, MD    Review of Systems  Constitutional:  Negative for appetite change and fatigue.  HENT:  Negative for congestion, postnasal drip and sore throat.   Eyes: Negative.   Respiratory:  Negative for cough, chest tightness and shortness of breath.   Cardiovascular:  Negative for chest pain, palpitations and leg swelling.  Gastrointestinal:  Negative for abdominal distention and abdominal pain.  Endocrine: Negative.   Genitourinary:        Foley catheter in place  Musculoskeletal:  Negative for back pain and neck pain.  Allergic/Immunologic: Negative.   Neurological:  Negative for dizziness and light-headedness.  Hematological:  Negative for adenopathy. Does not bruise/bleed easily.  Psychiatric/Behavioral:  Negative for dysphoric mood and sleep disturbance (sleeping on 1 pillow). The patient is not nervous/anxious.    Vitals:   02/01/21 1150  BP: 132/63  Pulse: 79  Resp: 18  SpO2: 99%  Weight: 175 lb 4 oz (79.5 kg)  Height: 5\' 10"  (1.778 m)   Wt Readings from Last 3 Encounters:  02/01/21 175  lb 4 oz (79.5 kg)  01/24/21 171 lb (77.6 kg)  01/19/21 183 lb 3.2 oz (83.1 kg)   Lab Results  Component Value Date   CREATININE 1.37 (H) 01/19/2021   CREATININE 1.36 (H) 01/18/2021   CREATININE 1.24 01/16/2021    Physical Exam Vitals and nursing note reviewed. Exam conducted with a chaperone present (wife).  Constitutional:      Appearance: Normal appearance.  HENT:     Head: Normocephalic and atraumatic.  Cardiovascular:     Rate and Rhythm: Normal rate and regular rhythm.  Pulmonary:     Effort: Pulmonary effort is normal. No respiratory distress.     Breath sounds: No wheezing or rales.  Abdominal:     General: There is no distension.     Palpations: Abdomen is soft.  Musculoskeletal:        General: No tenderness.     Cervical back: Normal range of motion and neck supple.     Right lower leg: No edema.     Left lower leg: No edema.  Skin:    General: Skin is warm and dry.  Neurological:     General: No focal deficit present.     Mental Status: He is alert and oriented to person, place, and time.  Psychiatric:        Mood and Affect: Mood normal.        Behavior: Behavior normal.        Thought Content: Thought content normal.   Assessment & Plan:  1: Chronic heart failure with reduced ejection fraction- - NYHA class I - euvolemic today - weighing daily; reminded to call for an overnight weight gain of > 2 pounds or a weekly weight gain of >5 pounds - not adding salt and wife is reading food labels for sodium content; low sodium cookbook were provided - drinking ~ 64 ounces daily - on GDMT of losartan and metoprolol (although tartrate) - cough with lisinopril - consider adding spironolactone/ SGLT2 although he's currently asymptomatic - BNP 01/16/21 was 793.0  2: HTN- - BP looks good (132/63) - saw PCP Danise Mina) 12/13/20 - BMP 01/19/21 reviewed and showed sodium 136, potassium 3.9, creatinine 1.37 & GFR  79  3: CAD- - saw cardiology Nehemiah Massed) 01/29/21 -  on clopidogrel  4: BPH- - has foley in place - saw urology Erlene Quan) 01/24/21 - having cystoscopy next week   Medication list reviewed.   Return in 6 weeks, sooner if needed.

## 2021-02-01 NOTE — Patient Instructions (Signed)
Continue weighing daily and call for an overnight weight gain of 3 pounds or more or a weekly weight gain of more than 5 pounds.  °

## 2021-02-05 ENCOUNTER — Other Ambulatory Visit: Payer: Self-pay

## 2021-02-05 ENCOUNTER — Ambulatory Visit
Admission: RE | Admit: 2021-02-05 | Discharge: 2021-02-05 | Disposition: A | Discharge status: Home / Self Care | Payer: Medicare Other | Source: Ambulatory Visit | Attending: Urology | Admitting: Urology

## 2021-02-05 DIAGNOSIS — N281 Cyst of kidney, acquired: Secondary | ICD-10-CM | POA: Diagnosis not present

## 2021-02-05 DIAGNOSIS — N200 Calculus of kidney: Secondary | ICD-10-CM | POA: Diagnosis not present

## 2021-02-05 DIAGNOSIS — R319 Hematuria, unspecified: Secondary | ICD-10-CM | POA: Insufficient documentation

## 2021-02-05 DIAGNOSIS — K5730 Diverticulosis of large intestine without perforation or abscess without bleeding: Secondary | ICD-10-CM | POA: Diagnosis not present

## 2021-02-05 MED ORDER — IOHEXOL 300 MG/ML  SOLN
100.0000 mL | Freq: Once | INTRAMUSCULAR | Status: AC | PRN
  Administered 2021-02-05: 100 mL via INTRAVENOUS

## 2021-02-05 NOTE — Progress Notes (Signed)
02/06/2021 CC:  Chief Complaint  Patient presents with   Cysto     HPI: Shawn Andrews is a 88 y.o. male with a personal history of elevated PSA, groin pain, urinary retention, CKD stage IIIa , BPH, and gross hematuria , who presents today for cystoscopy and CT urogram results.   During visit with Dr.Gutierrez on 12/22/2020 he was having ongoing stinging and burning during urination. He left a urine specimen that showed no growth urinalysis showed small Hgb, large leukocytes, 3-6 RBCs. He was prescribed ciprofloxacin and treated as prostatitis the dose was extended for 2 weeks for ongoing symptoms.   Associated urine culture was negative.  He did not necessarily improve on these antibiotics.   He is currently on chronic finasteride and Cardura 4 mg.   He was seen in the ED on 01/17/2021 for shortness of breath and chest pain. During this admission he developed urinary retention and had a foley with red tinged urine he was on heparin at the time. He foley was discharged with Foley after essentially failing a voiding trial.  He is subsequently failed another voiding trial in our office.  He underwent multiple biopsies in [place] in the past.   CT urogram on 02/05/2021 revealed a punctate 1 mm nonobstructing stone lower pole left kidney. Bilateral renal cysts. No suspicious enhancing mass lesion identified in either kidney. No urothelial abnormality. Bladder wall trabeculation with multiple bladder diverticuli suggests underlying component of bladder outlet obstruction. Marked prostatomegaly.  He is accompanied by his wife and son today.   Vitals:   02/06/21 1125  BP: (!) 114/59  Pulse: 61  NED. A&Ox3.   No respiratory distress   Abd soft, NT, ND Normal phallus with bilateral descended testicles  Cystoscopy Procedure Note  Patient identification was confirmed, informed consent was obtained, and patient was prepped using Betadine solution.  Lidocaine jelly was administered per  urethral meatus.     Pre-Procedure: - Inspection reveals a normal caliber ureteral meatus.  Procedure: The flexible cystoscope was introduced without difficulty - No urethral strictures/lesions are present. - massively enlarged prostate with severe trilobar coaptation at least 7 cm length - Mild catheter cystitis heavely trbaeculate with divertivuli  - Bilateral ureteral orifices identified - Bladder mucosa  reveals no ulcers, tumors - No bladder stones - Heavily trabeculated with diverticula and saccules  Retroflexion shows massively enlarged prostate with intravesical protrusion   Post-Procedure: - Patient tolerated the procedure well  Assessment/ Plan:  BPH with outlet obstruction/ chronic retention - Cystoscopy shows NED other than significant prostamegaly and severely trabeculated bladder, end-stage appearance -We discussed the role of an outlet procedure but in the setting of his age and multiple medical comorbidities as well as likelihood of success, he is less interested in this.  If he did want to pursue this, he have to be cleared by cardiology and anesthesiology as well as undergo urodynamics to ensure that his bladder has enough function to warrant consideration of outlet procedure. - We discussed treatment option such as self catheterization versus surgical intervention versus SPT (suprapubic tube) .  -He is concerned today about his ability to be successful in this, has a mild tremor however I do think it is at least worth a trial given the decreased risk for infections and freedom from chronic indwelling Foley - He would like to proceed with self catheterization. Catheter was replaced today.  - Return for self cath teaching.  -We will continue finasteride and Cardura given he tolerates these medications  as well as possibly increase the likelihood of voiding spontaneously down the road  2. Hematuria  Cystoscopy was reassuring, negative other than severe prostatomegaly.    Return for CIC teaching with Zara Council, PA-C.   Gevena Mart Littlejohn,acting as a scribe for Hollice Espy, MD.,have documented all relevant documentation on the behalf of Hollice Espy, MD,as directed by  Hollice Espy, MD while in the presence of Hollice Espy, MD.  I have reviewed the above documentation for accuracy and completeness, and I agree with the above.   Hollice Espy, MD

## 2021-02-06 ENCOUNTER — Ambulatory Visit: Payer: Medicare Other | Admitting: Urology

## 2021-02-06 VITALS — BP 114/59 | HR 61 | Ht 70.0 in | Wt 174.0 lb

## 2021-02-06 DIAGNOSIS — R319 Hematuria, unspecified: Secondary | ICD-10-CM

## 2021-02-06 NOTE — Progress Notes (Signed)
Catheter Removal  Patient is present today for a catheter removal.  9 ml of water was drained from the balloon. A 18 FR Coude foley cath was removed from the bladder no complications were noted . Patient tolerated well. Dr. Erlene Quan instilled about 200 cc during cystoscopy. Patient was only able to void 50 ml on his own  Performed by: Kerman Passey, RMA  Follow up/ Additional notes: As discussed.

## 2021-02-06 NOTE — Addendum Note (Signed)
Addended by: Alvera Novel on: 02/06/2021 01:02 PM   Modules accepted: Orders

## 2021-02-06 NOTE — Progress Notes (Signed)
Simple Catheter Placement  Due to urinary retention patient is present today for a foley cath placement.  Patient was cleaned and prepped in a sterile fashion with betadine. A 18  FR coude foley catheter was inserted, urine return was noted  200 ml, urine was yellow in color.  The balloon was filled with 10cc of sterile water.  A leg bag was attached for drainage. Patient was also given a night bag to take home and was given instruction on how to change from one bag to another.  Patient was given instruction on proper catheter care.  Patient tolerated well, no complications were noted   Performed by: Kerman Passey, RMA  Additional notes/ Follow up: follow up with shannon on Monday in [place] for CIC teaching per dr Erlene Quan.

## 2021-02-08 ENCOUNTER — Encounter: Payer: Self-pay | Admitting: Urology

## 2021-02-08 ENCOUNTER — Telehealth: Payer: Self-pay | Admitting: *Deleted

## 2021-02-08 ENCOUNTER — Telehealth: Payer: Self-pay

## 2021-02-08 DIAGNOSIS — R339 Retention of urine, unspecified: Secondary | ICD-10-CM

## 2021-02-08 NOTE — Telephone Encounter (Signed)
Transition Care Management Unsuccessful Follow-up Telephone Call  Date of discharge and from where:  01/19/21 from [facility]  Attempts:  1st Attempt  Reason for unsuccessful TCM follow-up call:  Left voice message  Thea Silversmith, RN, MSN, BSN, [facility] Management Coordinator [phone]

## 2021-02-08 NOTE — Telephone Encounter (Signed)
Spoke with patient, he expressed his concerns with self catherization. Cancelled appointment for 02/12/21. Informed will send orders to IR for SPT placement.

## 2021-02-09 ENCOUNTER — Other Ambulatory Visit: Payer: Self-pay | Admitting: Family Medicine

## 2021-02-09 DIAGNOSIS — R339 Retention of urine, unspecified: Secondary | ICD-10-CM

## 2021-02-09 NOTE — Telephone Encounter (Signed)
See My chart message

## 2021-02-12 ENCOUNTER — Telehealth: Payer: Self-pay

## 2021-02-12 ENCOUNTER — Telehealth: Payer: Self-pay | Admitting: Family Medicine

## 2021-02-12 ENCOUNTER — Ambulatory Visit: Payer: Medicare Other | Admitting: Urology

## 2021-02-12 NOTE — Telephone Encounter (Signed)
Transition Care Management Unsuccessful Follow-up Telephone Call  Date of discharge and from where:  01/19/2021 [facility]  Attempts:  2nd Attempt  Reason for unsuccessful TCM follow-up call:  No answer/busy Tomasa Rand, RN, BSN, CEN [place] Coordinator [phone]

## 2021-02-12 NOTE — Telephone Encounter (Signed)
Patient notified he has been scheduled with IR to have the Suprapubic placement on Tues 1/31 at 10:30a and to arrive at 9:30a.  Dr. Nehemiah Massed has approved patient to stop Plavix 5 days prior. The IR Nurse will call him a couple of days prior to his procedure to go over his instructions. Per IR the last day to take his Plavix is Wed 1/25.  Patient voiced understanding.

## 2021-02-13 NOTE — Progress Notes (Signed)
Patient on schedule for SPT 02/20/2021, spoke with patient's wife on phone with pre procedure instructions given. Made aware to be here @ 0930, NPO after MN prior to procedure as well as driver post procedure/recovery/discharge. Also holding Plavix LD 02/14/2021, stated understanding.

## 2021-02-19 ENCOUNTER — Other Ambulatory Visit: Payer: Self-pay | Admitting: Radiology

## 2021-02-20 ENCOUNTER — Telehealth: Payer: Self-pay | Admitting: Family Medicine

## 2021-02-20 ENCOUNTER — Ambulatory Visit
Admission: RE | Admit: 2021-02-20 | Discharge: 2021-02-20 | Disposition: A | Discharge status: Home / Self Care | Payer: Medicare Other | Source: Ambulatory Visit | Attending: Urology | Admitting: Urology

## 2021-02-20 ENCOUNTER — Other Ambulatory Visit: Payer: Self-pay

## 2021-02-20 DIAGNOSIS — R339 Retention of urine, unspecified: Secondary | ICD-10-CM | POA: Insufficient documentation

## 2021-02-20 DIAGNOSIS — N401 Enlarged prostate with lower urinary tract symptoms: Secondary | ICD-10-CM | POA: Insufficient documentation

## 2021-02-20 DIAGNOSIS — Z7902 Long term (current) use of antithrombotics/antiplatelets: Secondary | ICD-10-CM | POA: Diagnosis not present

## 2021-02-20 DIAGNOSIS — N320 Bladder-neck obstruction: Secondary | ICD-10-CM | POA: Insufficient documentation

## 2021-02-20 LAB — CBC
HCT: 34 % — ABNORMAL LOW (ref 39.0–52.0)
Hemoglobin: 11.5 g/dL — ABNORMAL LOW (ref 13.0–17.0)
MCH: 28.8 pg (ref 26.0–34.0)
MCHC: 33.8 g/dL (ref 30.0–36.0)
MCV: 85.2 fL (ref 80.0–100.0)
Platelets: 192 10*3/uL (ref 150–400)
RBC: 3.99 MIL/uL — ABNORMAL LOW (ref 4.22–5.81)
RDW: 14 % (ref 11.5–15.5)
WBC: 7.1 10*3/uL (ref 4.0–10.5)
nRBC: 0 % (ref 0.0–0.2)

## 2021-02-20 LAB — PROTIME-INR
INR: 1.1 (ref 0.8–1.2)
Prothrombin Time: 14.4 seconds (ref 11.4–15.2)

## 2021-02-20 MED ORDER — MIDAZOLAM HCL 2 MG/2ML IJ SOLN
INTRAMUSCULAR | Status: AC | PRN
Start: 2021-02-20 — End: 2021-02-20
  Administered 2021-02-20: 1 mg via INTRAVENOUS

## 2021-02-20 MED ORDER — SODIUM CHLORIDE 0.9 % IV SOLN: INTRAVENOUS | Status: DC

## 2021-02-20 MED ORDER — FENTANYL CITRATE (PF) 100 MCG/2ML IJ SOLN
INTRAMUSCULAR | Status: AC | PRN
  Administered 2021-02-20: 50 ug via INTRAVENOUS

## 2021-02-20 MED ORDER — MIDAZOLAM HCL 2 MG/2ML IJ SOLN
INTRAMUSCULAR | Status: AC
  Filled 2021-02-20: qty 4

## 2021-02-20 MED ORDER — SODIUM CHLORIDE 0.9% FLUSH: 5.0000 mL | Freq: Three times a day (TID) | INTRAVENOUS | Status: DC

## 2021-02-20 MED ORDER — FENTANYL CITRATE (PF) 100 MCG/2ML IJ SOLN
INTRAMUSCULAR | Status: AC
  Filled 2021-02-20: qty 2

## 2021-02-20 NOTE — H&P (Signed)
Chief Complaint: Patient was seen in consultation today for urinary retention at the request of [place]  Referring Physician(s): Hollice Espy  Supervising Physician: Mir, Sharen Heck  Patient Status: [facility] - Out-pt  History of Present Illness: Shawn Andrews is a 88 y.o. male with BPH/urinary outlet obstruction and chronic urinary retention who has been seen by Urology on 1/17 and decision made for suprapubic catheter placement. Patient is here for his scheduled elective procedure and denies any changes since his Urology visit except he tried self-catheterization and wishes to proceed with SPT instead.   The patient has had a H&P performed within the last 30 days, all history, medications, and exam have been reviewed. The patient denies any interval changes since the H&P.  The patient denies any current chest pain or shortness of breath. He denies any current blood thinner use and has stopped his plavix as instructed. He has no known complications to sedation.    Past Medical History:  Diagnosis Date   Abnormal drug screen 06/22/2014   inapprop neg xanax rpt 3 mo (06/2014)   BPH (benign prostatic hypertrophy) 01/21/1998   has had 3 biopsies in past ([facility]) decided to stop PSA/DRE   CHF (congestive heart failure) (HCC)    Coronary artery disease    Hyperlipidemia 01/21/2002   Hypertension 05/22/2003   Left lumbar radiculopathy    Osteoarthritis 01/22/1988   knees, lumbar spondylosis and listhesis    Past Surgical History:  Procedure Laterality Date   CATARACT EXTRACTION  2013   bilateral   ESI Left 08/2014, 11/2014, 08/2015   L S1, L L5/S1 transforaminal ESI; L4/5 L5/S1 zygapophysial injections, L S1 transforaminal ([facility])   FINGER SURGERY     right middle, partial traumatic amputation   KNEE ARTHROSCOPY  1990   right   KNEE SURGERY  04/2006   right partial knee replacement in [place] - rec ppx abx for any invasive procedure   LEFT HEART CATH AND CORONARY  ANGIOGRAPHY N/A 01/18/2021   Procedure: LEFT HEART CATH AND CORONARY ANGIOGRAPHY;  Surgeon: Corey Skains, MD;  Location: [facility];  Service: Cardiovascular;  Laterality: N/A;    Allergies: Penicillins, Cialis [tadalafil], Lipitor [atorvastatin], and Lisinopril  Medications: Prior to Admission medications   Medication Sig Start Date End Date Taking? Authorizing Provider  acetaminophen (TYLENOL 8 HOUR) 650 MG CR tablet Take 1 tablet (650 mg total) by mouth 2 (two) times a day. 06/24/18  Yes Ria Bush, MD  amLODipine (NORVASC) 2.5 MG tablet Take 1 tablet (2.5 mg total) by mouth daily. 01/20/21  Yes Nita Sells, MD  Cholecalciferol (VITAMIN D3) 25 MCG (1000 UT) CAPS Take 1 capsule (1,000 Units total) by mouth daily. 08/25/18  Yes Ria Bush, MD  doxazosin (CARDURA) 4 MG tablet TAKE 1 TABLET BY MOUTH  DAILY 11/17/19  Yes Ria Bush, MD  famotidine (PEPCID) 20 MG tablet TAKE 1 TABLET BY MOUTH AT  BEDTIME 01/23/21  Yes Ria Bush, MD  finasteride (PROSCAR) 5 MG tablet TAKE 1 TABLET BY MOUTH  DAILY 11/17/19  Yes Ria Bush, MD  furosemide (LASIX) 20 MG tablet Take 1 tablet by mouth every other day. 10/09/20  Yes [provider]  isosorbide mononitrate (IMDUR) 30 MG 24 hr tablet Take 1 tablet by mouth daily. 10/10/20  Yes [provider]  loratadine (CLARITIN) 10 MG tablet Take 1 tablet (10 mg total) by mouth daily as needed for allergies. 12/22/12  Yes Ria Bush, MD  metoprolol tartrate (LOPRESSOR) 50 MG tablet Take 0.5  tablets (25 mg total) by mouth 2 (two) times daily. 10/16/20  Yes Ria Bush, MD  primidone (MYSOLINE) 50 MG tablet Take 2 tablets (100 mg total) by mouth 3 (three) times daily. 10/16/20  Yes Ria Bush, MD  rosuvastatin (CRESTOR) 5 MG tablet Take 5 mg by mouth daily. 12/21/20 12/21/21 Yes [provider]  aspirin (ASPIRIN 81) 81 MG EC tablet Take 1 tablet (81 mg total) by mouth every  Monday, Wednesday, and Friday. Patient not taking: Reported on 02/20/2021 08/30/20   Ria Bush, MD  clopidogrel (PLAVIX) 75 MG tablet Take 1 tablet by mouth daily. 10/10/20   [provider]  Coenzyme Q10 (CO Q-10) 100 MG CAPS Take 1 capsule by mouth daily. 12/21/20   Ria Bush, MD  docusate sodium (COLACE) 100 MG capsule Take 1 capsule (100 mg total) by mouth daily. 12/13/20   Ria Bush, MD  losartan (COZAAR) 50 MG tablet Take 1 tablet (50 mg total) by mouth daily. 12/13/20   Ria Bush, MD  nitroGLYCERIN (NITROSTAT) 0.4 MG SL tablet Place under the tongue. 10/09/20   [provider]  polyethylene glycol powder (GLYCOLAX/MIRALAX) 17 GM/SCOOP powder Take 8.5-17 g by mouth daily as needed for moderate constipation. 12/13/20   Ria Bush, MD  vitamin B-12 (CYANOCOBALAMIN) 500 MCG tablet Take 1 tablet (500 mcg total) by mouth once a week. 08/30/20   Ria Bush, MD     Family History  Problem Relation Age of Onset   Hypertension Mother    Heart disease Brother 72       MI   Diabetes Brother    Cancer Daughter        melanoma   Stroke Neg Hx     Social History   Socioeconomic History   Marital status: Married    Spouse name: Not on file   Number of children: Not on file   Years of education: Not on file   Highest education level: Not on file  Occupational History   Occupation: retired Event organiser, Facilities manager, owns lawn care business  Tobacco Use   Smoking status: Former    Packs/day: 0.00    Years: 0.00    Pack years: 0.00    Types: Pipe, Cigarettes    Quit date: 12/26/2000    Years since quitting: 20.1   Smokeless tobacco: Current    Types: Chew   Tobacco comments:    quit over 30 years ago  Vaping Use   Vaping Use: Never used  Substance and Sexual Activity   Alcohol use: Yes    Alcohol/week: 0.0 standard drinks    Comment: Occasional beer   Drug use: No   Sexual activity: Not on file  Other Topics  Concern   Not on file  Social History Narrative   Widowed, remarried 08-02-07.   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  All other systems are negative.  Review of Systems  Vital Signs: BP 132/64    Pulse Marland Kitchen)  58    Temp 97.7 F (36.5 C) (Oral)    Resp 19    Ht 5\' 10"  (1.778 m)    Wt 173 lb (78.5 kg)    BMI 24.82 kg/m   Physical Exam Constitutional:      Appearance: Normal appearance.  HENT:     Head: Normocephalic and atraumatic.  Cardiovascular:     Rate and Rhythm: Regular rhythm. Bradycardia present.  Pulmonary:     Effort: Pulmonary effort is normal. No respiratory distress.     Breath sounds: Normal breath sounds.  Skin:    General: Skin is warm and dry.  Neurological:     Mental Status: He is alert and oriented to person, place, and time.    Imaging: CT HEMATURIA WORKUP  Result Date: 02/05/2021 CLINICAL DATA:  Hematuria. EXAM: CT ABDOMEN AND PELVIS WITHOUT AND WITH CONTRAST TECHNIQUE: Multidetector CT imaging of the abdomen and pelvis was performed following the standard protocol before and following the bolus administration of intravenous contrast. RADIATION DOSE REDUCTION: This exam was performed according to the departmental dose-optimization program which includes automated exposure control, adjustment of the mA and/or kV according to patient size and/or use of iterative reconstruction technique. CONTRAST:  134mL OMNIPAQUE IOHEXOL 300 MG/ML  SOLN COMPARISON:  None.  FINDINGS: Lower chest: Centrilobular emphsyema noted. Collapse/consolidative opacity noted left lower lobe with moderate left pleural effusion. Hepatobiliary: No suspicious focal abnormality within the liver parenchyma. There is no evidence for gallstones, gallbladder wall thickening, or pericholecystic fluid. No intrahepatic or extrahepatic biliary dilation. Pancreas: No focal mass lesion. No dilatation of the main duct. No intraparenchymal cyst. No peripancreatic edema. Spleen: No splenomegaly. No focal mass lesion. Adrenals/Urinary Tract: No adrenal nodule or mass. Precontrast imaging shows no stones in the right kidney. Punctate 1 mm nonobstructing stone noted lower pole left kidney (image 40/series 2). No ureteral or bladder stones. Imaging After IV contrast administration shows no suspicious enhancing mass lesion in either kidney. Bilateral renal cysts evident, measuring up to 3.7 cm in the right kidney and 3.2 cm in the left kidney. Delayed post-contrast imaging shows no wall thickening or soft tissue filling defect in either intrarenal collecting system or renal pelvis. Neither ureter completely opacified but there is no evidence for focal hydroureter, ureteral wall thickening, or mass lesion along the length of either ureter. Bladder wall is irregular and trabeculated with multiple bladder diverticuli evident. Gas in the bladder lumen likely secondary to the presence of a Foley catheter. Stomach/Bowel: Stomach is unremarkable. No gastric wall thickening. No evidence of outlet obstruction. Duodenum is normally positioned as is the ligament of Treitz. No small bowel wall thickening. No small bowel dilatation. The terminal ileum is normal. The appendix is normal. Diverticuli are seen scattered along the entire length of the colon without CT findings of diverticulitis. Vascular/Lymphatic: There is moderate atherosclerotic calcification of the abdominal aorta without aneurysm. There is no gastrohepatic or  hepatoduodenal ligament lymphadenopathy. No retroperitoneal or mesenteric lymphadenopathy. No pelvic sidewall lymphadenopathy. Reproductive: Prostate gland is markedly enlarged. Other: No intraperitoneal free fluid. Musculoskeletal: No worrisome lytic or sclerotic osseous abnormality. Bilateral pars interarticularis defects noted at L5. Mild anterolisthesis of L5 on S1 evident. IMPRESSION: 1. Collapse/consolidative opacity left lower lobe with moderate left pleural effusion. 2. Pneumonia not excluded.Punctate 1 mm nonobstructing stone lower pole left kidney. 3. Bilateral renal cysts. No suspicious enhancing mass lesion identified in either kidney. No urothelial abnormality. 4. Bladder wall trabeculation with multiple bladder diverticuli suggests underlying component of bladder outlet obstruction.  5. Marked prostatomegaly. 6. Bilateral pars interarticularis defects at L5 with mild anterolisthesis of L5 on S1. 7. Aortic Atherosclerosis (ICD10-I70.0) and Emphysema (ICD10-J43.9). Electronically Signed   By: Misty Stanley M.D.   On: 02/05/2021 15:10    Labs:  CBC: Recent Labs    01/16/21 1507 01/18/21 0703 01/19/21 0645 02/20/21 1017  WBC 6.7 7.1 5.6 7.1  HGB 10.4* 10.2* 9.8* 11.5*  HCT 31.0* 29.9* 29.6* 34.0*  PLT 322 272 276 192    COAGS: Recent Labs    01/17/21 0419 02/20/21 1017  INR 1.1 1.1  APTT 31  --     BMP: Recent Labs    12/13/20 0821 01/16/21 1507 01/18/21 0707 01/19/21 0645  NA 135 132* 130* 136  K 4.3 4.6 3.9 3.9  CL 103 100 99 101  CO2 26 26 25 26   GLUCOSE 160* 120* 128* 107*  BUN 25* 25* 23 30*  CALCIUM 9.2 8.7* 8.5* 8.6*  CREATININE 1.38 1.24 1.36* 1.37*  GFRNONAA  --  55* 49* 49*    LIVER FUNCTION TESTS: Recent Labs    08/25/20 0814 11/22/20 1118 11/29/20 1353 12/13/20 0821 01/18/21 0707 01/19/21 0645  BILITOT 0.5  --   --   --  0.8 0.8  AST 15  --   --   --  18 16  ALT 11  --   --   --  13 12  ALKPHOS 60  --   --   --  67 65  PROT 6.3  --   --   6.0* 5.7* 5.5*  ALBUMIN 4.2   < > 3.8 4.1 3.2* 3.1*   < > = values in this interval not displayed.    Assessment and Plan: 88 year old male with BPH/urinary outlet obstruction and chronic urinary retention who has been seen by Urology on 1/17 and decision made for suprapubic catheter placement. Patient is here for his scheduled elective procedure and denies any changes since his Urology visit except he tried self-catheterization and wishes to proceed with SPT instead.   The patient has been NPO, no blood thinners taken- Plavix has been held, labs and vitals have been reviewed.  Risks and benefits of CT guided suprapubic catheter placement with moderate sedation was discussed with the patient and/or patient's family including, but not limited to bleeding, infection, damage to adjacent structures and requiring additional tests to upsize or exchange catheter.  All of the questions were answered and there is agreement to proceed.  Consent signed and in chart.  Thank you for this interesting consult.  I greatly enjoyed meeting Hanz F Zingale and look forward to participating in their care.  A copy of this report was sent to the requesting provider on this date.  Electronically Signed: Hedy Jacob, PA-C 02/20/2021, 11:04 AM   I spent a total of 15 Minutes in face to face in clinical consultation, greater than 50% of which was counseling/coordinating care for urinary retention.

## 2021-02-20 NOTE — Procedures (Signed)
Interventional Radiology Procedure Note  Procedure: CT guided Suprapubic catheter placement  Indication: BPH. Bladder outlet obstruction.  Findings: Please refer to procedural dictation for full description.  Complications: None  EBL: < 10 mL  Miachel Roux, MD [phone]

## 2021-02-20 NOTE — Telephone Encounter (Signed)
Patient had a 16 fr SPT placed in IR successfully. I have scheduled him for a 4 week SPT catheter exchange in office. Patient states he is doing well.

## 2021-02-25 ENCOUNTER — Other Ambulatory Visit: Payer: Self-pay

## 2021-02-25 ENCOUNTER — Inpatient Hospital Stay
Admission: EM | Admit: 2021-02-25 | Discharge: 2021-03-04 | DRG: 641 | Disposition: A | Discharge status: Home / Self Care | Payer: Medicare Other | Attending: Internal Medicine | Admitting: Internal Medicine

## 2021-02-25 ENCOUNTER — Emergency Department: Payer: Medicare Other

## 2021-02-25 DIAGNOSIS — F17220 Nicotine dependence, chewing tobacco, uncomplicated: Secondary | ICD-10-CM | POA: Diagnosis present

## 2021-02-25 DIAGNOSIS — E785 Hyperlipidemia, unspecified: Secondary | ICD-10-CM | POA: Diagnosis present

## 2021-02-25 DIAGNOSIS — Z79899 Other long term (current) drug therapy: Secondary | ICD-10-CM | POA: Diagnosis not present

## 2021-02-25 DIAGNOSIS — Z7902 Long term (current) use of antithrombotics/antiplatelets: Secondary | ICD-10-CM | POA: Diagnosis not present

## 2021-02-25 DIAGNOSIS — N3001 Acute cystitis with hematuria: Secondary | ICD-10-CM

## 2021-02-25 DIAGNOSIS — R338 Other retention of urine: Secondary | ICD-10-CM | POA: Diagnosis not present

## 2021-02-25 DIAGNOSIS — T83510S Infection and inflammatory reaction due to cystostomy catheter, sequela: Secondary | ICD-10-CM | POA: Diagnosis not present

## 2021-02-25 DIAGNOSIS — R3989 Other symptoms and signs involving the genitourinary system: Secondary | ICD-10-CM | POA: Diagnosis not present

## 2021-02-25 DIAGNOSIS — N1831 Chronic kidney disease, stage 3a: Secondary | ICD-10-CM | POA: Diagnosis present

## 2021-02-25 DIAGNOSIS — I10 Essential (primary) hypertension: Secondary | ICD-10-CM | POA: Diagnosis not present

## 2021-02-25 DIAGNOSIS — R1030 Lower abdominal pain, unspecified: Secondary | ICD-10-CM | POA: Diagnosis not present

## 2021-02-25 DIAGNOSIS — I5020 Unspecified systolic (congestive) heart failure: Secondary | ICD-10-CM | POA: Diagnosis not present

## 2021-02-25 DIAGNOSIS — Z955 Presence of coronary angioplasty implant and graft: Secondary | ICD-10-CM | POA: Diagnosis not present

## 2021-02-25 DIAGNOSIS — Z96651 Presence of right artificial knee joint: Secondary | ICD-10-CM | POA: Diagnosis present

## 2021-02-25 DIAGNOSIS — I130 Hypertensive heart and chronic kidney disease with heart failure and stage 1 through stage 4 chronic kidney disease, or unspecified chronic kidney disease: Secondary | ICD-10-CM | POA: Diagnosis present

## 2021-02-25 DIAGNOSIS — N1832 Chronic kidney disease, stage 3b: Secondary | ICD-10-CM | POA: Diagnosis present

## 2021-02-25 DIAGNOSIS — Z888 Allergy status to other drugs, medicaments and biological substances status: Secondary | ICD-10-CM | POA: Diagnosis not present

## 2021-02-25 DIAGNOSIS — N400 Enlarged prostate without lower urinary tract symptoms: Secondary | ICD-10-CM | POA: Diagnosis present

## 2021-02-25 DIAGNOSIS — Z7982 Long term (current) use of aspirin: Secondary | ICD-10-CM

## 2021-02-25 DIAGNOSIS — N3289 Other specified disorders of bladder: Secondary | ICD-10-CM | POA: Diagnosis present

## 2021-02-25 DIAGNOSIS — R1084 Generalized abdominal pain: Secondary | ICD-10-CM

## 2021-02-25 DIAGNOSIS — K5900 Constipation, unspecified: Secondary | ICD-10-CM | POA: Diagnosis present

## 2021-02-25 DIAGNOSIS — Z20822 Contact with and (suspected) exposure to covid-19: Secondary | ICD-10-CM | POA: Diagnosis present

## 2021-02-25 DIAGNOSIS — Z880 Allergy status to penicillin: Secondary | ICD-10-CM

## 2021-02-25 DIAGNOSIS — N390 Urinary tract infection, site not specified: Secondary | ICD-10-CM

## 2021-02-25 DIAGNOSIS — I252 Old myocardial infarction: Secondary | ICD-10-CM | POA: Diagnosis not present

## 2021-02-25 DIAGNOSIS — Z9359 Other cystostomy status: Secondary | ICD-10-CM | POA: Diagnosis not present

## 2021-02-25 DIAGNOSIS — N138 Other obstructive and reflux uropathy: Secondary | ICD-10-CM | POA: Diagnosis present

## 2021-02-25 DIAGNOSIS — R102 Pelvic and perineal pain: Secondary | ICD-10-CM | POA: Diagnosis not present

## 2021-02-25 DIAGNOSIS — N401 Enlarged prostate with lower urinary tract symptoms: Secondary | ICD-10-CM | POA: Diagnosis present

## 2021-02-25 DIAGNOSIS — I5022 Chronic systolic (congestive) heart failure: Secondary | ICD-10-CM | POA: Diagnosis present

## 2021-02-25 DIAGNOSIS — G250 Essential tremor: Secondary | ICD-10-CM | POA: Diagnosis present

## 2021-02-25 DIAGNOSIS — D631 Anemia in chronic kidney disease: Secondary | ICD-10-CM | POA: Diagnosis present

## 2021-02-25 DIAGNOSIS — I2510 Atherosclerotic heart disease of native coronary artery without angina pectoris: Secondary | ICD-10-CM | POA: Diagnosis present

## 2021-02-25 DIAGNOSIS — Z8249 Family history of ischemic heart disease and other diseases of the circulatory system: Secondary | ICD-10-CM | POA: Diagnosis not present

## 2021-02-25 DIAGNOSIS — E875 Hyperkalemia: Secondary | ICD-10-CM | POA: Diagnosis present

## 2021-02-25 DIAGNOSIS — N183 Chronic kidney disease, stage 3 unspecified: Secondary | ICD-10-CM | POA: Diagnosis present

## 2021-02-25 DIAGNOSIS — E871 Hypo-osmolality and hyponatremia: Secondary | ICD-10-CM | POA: Diagnosis present

## 2021-02-25 DIAGNOSIS — R109 Unspecified abdominal pain: Secondary | ICD-10-CM

## 2021-02-25 DIAGNOSIS — N4889 Other specified disorders of penis: Secondary | ICD-10-CM | POA: Diagnosis not present

## 2021-02-25 LAB — URINALYSIS, COMPLETE (UACMP) WITH MICROSCOPIC
Bacteria, UA: NONE SEEN
Bilirubin Urine: NEGATIVE
Glucose, UA: NEGATIVE mg/dL
Ketones, ur: NEGATIVE mg/dL
Nitrite: NEGATIVE
Protein, ur: >300 mg/dL — AB
RBC / HPF: >50 RBC/hpf — ABNORMAL HIGH (ref 0–5)
Specific Gravity, Urine: 1.02 (ref 1.005–1.030)
WBC, UA: >50 WBC/hpf — ABNORMAL HIGH (ref 0–5)
pH: 6 (ref 5.0–8.0)

## 2021-02-25 LAB — CBC
HCT: 30.6 % — ABNORMAL LOW (ref 39.0–52.0)
Hemoglobin: 10.9 g/dL — ABNORMAL LOW (ref 13.0–17.0)
MCH: 29.3 pg (ref 26.0–34.0)
MCHC: 35.6 g/dL (ref 30.0–36.0)
MCV: 82.3 fL (ref 80.0–100.0)
Platelets: 198 10*3/uL (ref 150–400)
RBC: 3.72 MIL/uL — ABNORMAL LOW (ref 4.22–5.81)
RDW: 14 % (ref 11.5–15.5)
WBC: 6.3 10*3/uL (ref 4.0–10.5)
nRBC: 0 % (ref 0.0–0.2)

## 2021-02-25 LAB — COMPREHENSIVE METABOLIC PANEL
ALT: 12 U/L (ref 0–44)
AST: 23 U/L (ref 15–41)
Albumin: 3.3 g/dL — ABNORMAL LOW (ref 3.5–5.0)
Alkaline Phosphatase: 56 U/L (ref 38–126)
Anion gap: 6 (ref 5–15)
BUN: 25 mg/dL — ABNORMAL HIGH (ref 8–23)
CO2: 23 mmol/L (ref 22–32)
Calcium: 8.7 mg/dL — ABNORMAL LOW (ref 8.9–10.3)
Chloride: 94 mmol/L — ABNORMAL LOW (ref 98–111)
Creatinine, Ser: 1.29 mg/dL — ABNORMAL HIGH (ref 0.61–1.24)
GFR, Estimated: 53 mL/min — ABNORMAL LOW (ref >60)
Glucose, Bld: 167 mg/dL — ABNORMAL HIGH (ref 70–99)
Potassium: 4.9 mmol/L (ref 3.5–5.1)
Sodium: 123 mmol/L — ABNORMAL LOW (ref 135–145)
Total Bilirubin: 0.9 mg/dL (ref 0.3–1.2)
Total Protein: 5.6 g/dL — ABNORMAL LOW (ref 6.5–8.1)

## 2021-02-25 LAB — RESP PANEL BY RT-PCR (FLU A&B, COVID) ARPGX2
Influenza A by PCR: NEGATIVE
Influenza B by PCR: NEGATIVE
SARS Coronavirus 2 by RT PCR: NEGATIVE

## 2021-02-25 LAB — SODIUM, URINE, RANDOM: Sodium, Ur: 52 mmol/L

## 2021-02-25 LAB — OSMOLALITY: Osmolality: 268 mOsm/kg — ABNORMAL LOW (ref 275–295)

## 2021-02-25 MED ORDER — SODIUM CHLORIDE 0.9 % IV SOLN
250.0000 mL | INTRAVENOUS | Status: DC | PRN
  Administered 2021-02-25 – 2021-03-01 (×2): 250 mL via INTRAVENOUS

## 2021-02-25 MED ORDER — DOCUSATE SODIUM 100 MG PO CAPS
100.0000 mg | ORAL_CAPSULE | Freq: Every day | ORAL | Status: DC
  Administered 2021-02-26 – 2021-03-04 (×6): 100 mg via ORAL
  Filled 2021-02-25 (×7): qty 1

## 2021-02-25 MED ORDER — FINASTERIDE 5 MG PO TABS
5.0000 mg | ORAL_TABLET | Freq: Every day | ORAL | Status: DC
  Administered 2021-02-25 – 2021-03-04 (×8): 5 mg via ORAL
  Filled 2021-02-25 (×8): qty 1

## 2021-02-25 MED ORDER — IOHEXOL 300 MG/ML  SOLN
100.0000 mL | Freq: Once | INTRAMUSCULAR | Status: AC | PRN
  Administered 2021-02-25: 100 mL via INTRAVENOUS
  Filled 2021-02-25: qty 100

## 2021-02-25 MED ORDER — ONDANSETRON HCL 4 MG/2ML IJ SOLN: 4.0000 mg | Freq: Four times a day (QID) | INTRAMUSCULAR | Status: DC | PRN

## 2021-02-25 MED ORDER — ROSUVASTATIN CALCIUM 5 MG PO TABS
5.0000 mg | ORAL_TABLET | Freq: Every day | ORAL | Status: DC
  Administered 2021-02-26 – 2021-03-04 (×7): 5 mg via ORAL
  Filled 2021-02-25 (×7): qty 1

## 2021-02-25 MED ORDER — FAMOTIDINE 20 MG PO TABS
20.0000 mg | ORAL_TABLET | Freq: Every day | ORAL | Status: DC
  Administered 2021-02-25 – 2021-03-03 (×7): 20 mg via ORAL
  Filled 2021-02-25 (×7): qty 1

## 2021-02-25 MED ORDER — ACETAMINOPHEN 650 MG RE SUPP: 650.0000 mg | Freq: Four times a day (QID) | RECTAL | Status: DC | PRN

## 2021-02-25 MED ORDER — HYDROCODONE-ACETAMINOPHEN 5-325 MG PO TABS
1.0000 | ORAL_TABLET | Freq: Four times a day (QID) | ORAL | Status: DC | PRN
  Administered 2021-02-26: 1 via ORAL
  Filled 2021-02-25 (×2): qty 1

## 2021-02-25 MED ORDER — SODIUM CHLORIDE 0.9 % IV SOLN
1.0000 g | INTRAVENOUS | Status: DC
  Administered 2021-02-25 – 2021-02-26 (×2): 1 g via INTRAVENOUS
  Filled 2021-02-25 (×3): qty 10

## 2021-02-25 MED ORDER — FUROSEMIDE 20 MG PO TABS: 20.0000 mg | ORAL_TABLET | ORAL | Status: DC

## 2021-02-25 MED ORDER — ENOXAPARIN SODIUM 40 MG/0.4ML IJ SOSY
40.0000 mg | PREFILLED_SYRINGE | INTRAMUSCULAR | Status: DC
  Administered 2021-02-25 – 2021-03-03 (×7): 40 mg via SUBCUTANEOUS
  Filled 2021-02-25 (×7): qty 0.4

## 2021-02-25 MED ORDER — FUROSEMIDE 20 MG PO TABS
20.0000 mg | ORAL_TABLET | ORAL | Status: DC
  Administered 2021-02-26 – 2021-03-04 (×5): 20 mg via ORAL
  Filled 2021-02-25 (×5): qty 1

## 2021-02-25 MED ORDER — MELATONIN 3 MG PO TABS
3.0000 mg | ORAL_TABLET | Freq: Every evening | ORAL | Status: DC | PRN
  Filled 2021-02-25: qty 1

## 2021-02-25 MED ORDER — CLOPIDOGREL BISULFATE 75 MG PO TABS
75.0000 mg | ORAL_TABLET | Freq: Every day | ORAL | Status: DC
  Administered 2021-02-26 – 2021-03-04 (×7): 75 mg via ORAL
  Filled 2021-02-25 (×7): qty 1

## 2021-02-25 MED ORDER — SODIUM CHLORIDE 0.9% FLUSH: 3.0000 mL | INTRAVENOUS | Status: DC | PRN

## 2021-02-25 MED ORDER — LOSARTAN POTASSIUM 50 MG PO TABS
50.0000 mg | ORAL_TABLET | Freq: Every day | ORAL | Status: DC
  Administered 2021-02-26 – 2021-03-01 (×4): 50 mg via ORAL
  Filled 2021-02-25 (×4): qty 1

## 2021-02-25 MED ORDER — DOXAZOSIN MESYLATE 4 MG PO TABS
4.0000 mg | ORAL_TABLET | Freq: Every day | ORAL | Status: DC
  Administered 2021-02-25 – 2021-03-04 (×8): 4 mg via ORAL
  Filled 2021-02-25 (×8): qty 1

## 2021-02-25 MED ORDER — SODIUM CHLORIDE 0.9% FLUSH
3.0000 mL | Freq: Two times a day (BID) | INTRAVENOUS | Status: DC
  Administered 2021-02-25 – 2021-03-04 (×13): 3 mL via INTRAVENOUS

## 2021-02-25 MED ORDER — ONDANSETRON HCL 4 MG PO TABS
4.0000 mg | ORAL_TABLET | Freq: Four times a day (QID) | ORAL | Status: DC | PRN
Start: 2021-02-25 — End: 2021-03-04

## 2021-02-25 MED ORDER — POLYETHYLENE GLYCOL 3350 17 G PO PACK
17.0000 g | PACK | Freq: Every day | ORAL | Status: DC | PRN
  Administered 2021-02-26 – 2021-02-28 (×2): 17 g via ORAL
  Filled 2021-02-25 (×2): qty 1

## 2021-02-25 MED ORDER — AMLODIPINE BESYLATE 5 MG PO TABS
2.5000 mg | ORAL_TABLET | Freq: Every day | ORAL | Status: DC
  Filled 2021-02-25: qty 1

## 2021-02-25 MED ORDER — PRIMIDONE 50 MG PO TABS
100.0000 mg | ORAL_TABLET | Freq: Three times a day (TID) | ORAL | Status: DC
  Administered 2021-02-25 – 2021-03-04 (×21): 100 mg via ORAL
  Filled 2021-02-25 (×24): qty 2

## 2021-02-25 MED ORDER — ACETAMINOPHEN 325 MG PO TABS: 650.0000 mg | ORAL_TABLET | Freq: Four times a day (QID) | ORAL | Status: DC | PRN

## 2021-02-25 MED ORDER — MELATONIN 5 MG PO TABS
5.0000 mg | ORAL_TABLET | Freq: Every evening | ORAL | Status: DC | PRN
  Administered 2021-02-25 – 2021-03-03 (×6): 5 mg via ORAL
  Filled 2021-02-25 (×7): qty 1

## 2021-02-25 MED ORDER — CIPROFLOXACIN IN D5W 400 MG/200ML IV SOLN
400.0000 mg | Freq: Once | INTRAVENOUS | Status: DC
  Filled 2021-02-25: qty 200

## 2021-02-25 MED ORDER — METOPROLOL TARTRATE 25 MG PO TABS
25.0000 mg | ORAL_TABLET | Freq: Two times a day (BID) | ORAL | Status: DC
  Administered 2021-02-25: 25 mg via ORAL
  Filled 2021-02-25 (×2): qty 1

## 2021-02-25 MED ORDER — ISOSORBIDE MONONITRATE ER 30 MG PO TB24
30.0000 mg | ORAL_TABLET | Freq: Every day | ORAL | Status: DC
  Administered 2021-02-26 – 2021-03-04 (×6): 30 mg via ORAL
  Filled 2021-02-25 (×7): qty 1

## 2021-02-25 NOTE — ED Triage Notes (Signed)
Pt comes pov from home with suprapubic catheter issues. Was inserted last Tuesday. Yesterday started having lower abd and penile pain. Also started urinating out of ureter as if he didn't have a catheter without control.

## 2021-02-25 NOTE — ED Notes (Signed)
See triage note. Pt to ED with ongoing pain for 3 days, pain in bilateral groin area, whole penis and testicles, and lower abdomen. Pt has enlarged prostate and recently had suprapubic catheter placed. States pain has gotten progressively worse and is currently 9/10.

## 2021-02-25 NOTE — H&P (Signed)
History and Physical    SAMSON RALPH NWG:[id number] DOB: [birth year] DOA: 02/25/2021  PCP: Leonel Ramsay, MD   Patient coming from:   Home  Chief Complaint: Lower abdominal pain  HPI: Shawn Andrews is a 88 y.o. male with medical history significant for CAD s/p MI in Sept 2022 with stents placed, HFrEF, BPH, HTN, HLD, essential tremor and urinary retention who had suprapubic catheter placed last week.  He presented to the emergency room at with abdominal pain that began this morning.  Patient reports lower abdominal and perineum pain that was so severe he had to walk around the house and could not lay down secondary to pain increasing.  Denies any flank pain.  He denies any fever or chills.  He has not had any nausea or vomiting.  Reports he has been making urine into the catheter bag and he had a small amount of urine from urethra last night.  Denies any injury or trauma to his abdomen.  He has not noticed any discoloration in the color of the urine and has not seen any blood in the urine.  Reports he was told to drink 64 ounces of fluid a day but a few days ago when his wife called their primary doctor to report he was not making as much urine in the catheter she was told that he is probably only drinking half of what he should and to double his water intake.  For the last few days he has been drinking 8-10 16.9 oz bottles of water a day.  Lives with his wife.  No tobacco, alcohol, illicit drug use  ED Course: Mr. Fell is been hemodynamically stable in the emergency room.  And to be hyponatremic in the emergency room.  He has been afebrile and has no leukocytosis.  Urinalysis was yellow and cloudy with moderate leukocytes but negative nitrites.  Elevated protein and hemoglobin present in the urine.  CBC is unremarkable.  Patient has a chronic anemia that is stable.  Sodium 123 potassium 4.9 chloride 94 bicarb 23 creatinine 1.29 BUN 25 glucose 167 calcium 8.7 albumin 3.3 alkaline phosphatase 56  AST 23 ALT is 12 bilirubin 0.9. Covid and influenza negative.  Patient was given dose of ciprofloxacin with his reported penicillin allergy in the emergency room.  Hospitalist service asked to admit for further management  Review of Systems:  General: Denies fever, chills, weight loss, night sweats.  Denies dizziness.  HENT: Denies head trauma, headache, denies change in hearing, tinnitus. Denies nasal bleeding. Denies sore throat.  Denies difficulty swallowing Eyes: Denies blurry vision, pain in eye, drainage.  Denies discoloration of eyes. Neck: Denies pain.  Denies swelling.  Denies pain with movement. Cardiovascular: Denies chest pain, palpitations. Denies edema.  Denies orthopnea Respiratory: Denies shortness of breath, cough. Denies wheezing.  Denies sputum production Gastrointestinal: Reports lower abdominal pain. Denies nausea, vomiting, diarrhea.  Denies melena.   Musculoskeletal: Denies limitation of movement. Denies deformity or swelling. Denies arthralgias or myalgias. Genitourinary: Reports pelvic pain.  Denies discoloration of urine and catheter Skin: Denies rash.  Denies petechiae, purpura, ecchymosis. Neurological: Denies syncope.  Denies seizure activity.  Denies paresthesia.  Denies slurred speech, drooping face. Denies visual change. Psychiatric: Denies depression, anxiety. Denies hallucinations.  Past Medical History:  Diagnosis Date   Abnormal drug screen 06/22/2014   inapprop neg xanax rpt 3 mo (06/2014)   BPH (benign prostatic hypertrophy) 01/21/1998   has had 3 biopsies in past ([facility]) decided to stop PSA/DRE  CHF (congestive heart failure) (HCC)    Coronary artery disease    Hyperlipidemia 01/21/2002   Hypertension 05/22/2003   Left lumbar radiculopathy    Osteoarthritis 01/22/1988   knees, lumbar spondylosis and listhesis    Past Surgical History:  Procedure Laterality Date   CATARACT EXTRACTION  2013   bilateral   ESI Left 08/2014, 11/2014, 08/2015   L  S1, L L5/S1 transforaminal ESI; L4/5 L5/S1 zygapophysial injections, L S1 transforaminal ([facility])   FINGER SURGERY     right middle, partial traumatic amputation   KNEE ARTHROSCOPY  1990   right   KNEE SURGERY  04/2006   right partial knee replacement in [place] - rec ppx abx for any invasive procedure   LEFT HEART CATH AND CORONARY ANGIOGRAPHY N/A 01/18/2021   Procedure: LEFT HEART CATH AND CORONARY ANGIOGRAPHY;  Surgeon: Corey Skains, MD;  Location: [facility];  Service: Cardiovascular;  Laterality: N/A;    Social History  reports that he quit smoking about 20 years ago. His smoking use included pipe and cigarettes. His smokeless tobacco use includes chew. He reports current alcohol use. He reports that he does not use drugs.  Allergies  Allergen Reactions   Penicillins Swelling    Of tongue Has patient had a PCN reaction causing immediate rash, facial/tongue/throat swelling, SOB or lightheadedness with hypotension: Yes Has patient had a PCN reaction causing severe rash involving mucus membranes or skin necrosis: No Has patient had a PCN reaction that required hospitalization: No Has patient had a PCN reaction occurring within the last 10 years: No If all of the above answers are "NO", then may proceed with Cephalosporin use.   Cialis [Tadalafil] Other (See Comments)    Indigestion and "felt like crap"   Lipitor [Atorvastatin] Other (See Comments)    Severe myalgias   Lisinopril Cough    Dry nagging cough    Family History  Problem Relation Age of Onset   Hypertension Mother    Heart disease Brother 34       MI   Diabetes Brother    Cancer Daughter        melanoma   Stroke Neg Hx      Prior to Admission medications   Medication Sig Start Date End Date Taking? Authorizing Provider  acetaminophen (TYLENOL 8 HOUR) 650 MG CR tablet Take 1 tablet (650 mg total) by mouth 2 (two) times a day. 06/24/18  Yes Ria Bush, MD  amLODipine (NORVASC) 2.5 MG tablet  Take 1 tablet (2.5 mg total) by mouth daily. 01/20/21  Yes Nita Sells, MD  atorvastatin (LIPITOR) 40 MG tablet Take 40 mg by mouth daily. 02/20/21  Yes [provider]  Cholecalciferol (VITAMIN D3) 25 MCG (1000 UT) CAPS Take 1 capsule (1,000 Units total) by mouth daily. 08/25/18  Yes Ria Bush, MD  clopidogrel (PLAVIX) 75 MG tablet Take 1 tablet by mouth daily. 10/10/20  Yes [provider]  docusate sodium (COLACE) 100 MG capsule Take 1 capsule (100 mg total) by mouth daily. 12/13/20  Yes Ria Bush, MD  doxazosin (CARDURA) 4 MG tablet TAKE 1 TABLET BY MOUTH  DAILY 11/17/19  Yes Ria Bush, MD  famotidine (PEPCID) 20 MG tablet TAKE 1 TABLET BY MOUTH AT  BEDTIME 01/23/21  Yes Ria Bush, MD  finasteride (PROSCAR) 5 MG tablet TAKE 1 TABLET BY MOUTH  DAILY 11/17/19  Yes Ria Bush, MD  furosemide (LASIX) 20 MG tablet Take 1 tablet by mouth every other day. 10/09/20  Yes [provider]  isosorbide mononitrate (IMDUR) 30 MG 24 hr tablet Take 1 tablet by mouth daily. 10/10/20  Yes [provider]  loratadine (CLARITIN) 10 MG tablet Take 1 tablet (10 mg total) by mouth daily as needed for allergies. 12/22/12  Yes Ria Bush, MD  losartan (COZAAR) 50 MG tablet Take 1 tablet (50 mg total) by mouth daily. 12/13/20  Yes Ria Bush, MD  metoprolol tartrate (LOPRESSOR) 50 MG tablet Take 0.5 tablets (25 mg total) by mouth 2 (two) times daily. 10/16/20  Yes Ria Bush, MD  primidone (MYSOLINE) 50 MG tablet Take 2 tablets (100 mg total) by mouth 3 (three) times daily. 10/16/20  Yes Ria Bush, MD  rosuvastatin (CRESTOR) 5 MG tablet Take 5 mg by mouth daily. 12/21/20 12/21/21 Yes [provider]  vitamin B-12 (CYANOCOBALAMIN) 500 MCG tablet Take 1 tablet (500 mcg total) by mouth once a week. 08/30/20  Yes Ria Bush, MD  aspirin (ASPIRIN 81) 81 MG EC tablet Take 1 tablet (81 mg total) by mouth every  Monday, Wednesday, and Friday. Patient not taking: Reported on 02/20/2021 08/30/20   Ria Bush, MD  Coenzyme Q10 (CO Q-10) 100 MG CAPS Take 1 capsule by mouth daily. Patient not taking: Reported on 02/25/2021 12/21/20   Ria Bush, MD  nitroGLYCERIN (NITROSTAT) 0.4 MG SL tablet Place under the tongue. 10/09/20   [provider]  polyethylene glycol powder (GLYCOLAX/MIRALAX) 17 GM/SCOOP powder Take 8.5-17 g by mouth daily as needed for moderate constipation. 12/13/20   Ria Bush, MD    Physical Exam: Vitals:   02/25/21 1126 02/25/21 1130 02/25/21 1429  BP:  99/68 133/66  Pulse:  70 64  Resp:  20 16  Temp:  97.8 F (36.6 C)   TempSrc:  Oral   SpO2:  98% 95%  Weight: 78 kg    Height: 5\' 10"  (1.778 m)      Constitutional: NAD, calm, comfortable Vitals:   02/25/21 1126 02/25/21 1130 02/25/21 1429  BP:  99/68 133/66  Pulse:  70 64  Resp:  20 16  Temp:  97.8 F (36.6 C)   TempSrc:  Oral   SpO2:  98% 95%  Weight: 78 kg    Height: 5\' 10"  (1.778 m)     General: WDWN, Alert and oriented x3.  Eyes: EOMI, PERRL, conjunctivae normal.  Sclera nonicteric HENT:  [place]/AT, external ears normal.  Nares patent without epistasis.  Mucous membranes are moist. Posterior pharynx clear of any exudate Neck: Soft, normal range of motion, supple, no masses, Trachea midline Respiratory: clear to auscultation bilaterally, no wheezing, no crackles. Normal respiratory effort. No accessory muscle use.  Cardiovascular: Regular rate and rhythm, no murmurs / rubs / gallops. No extremity edema. 2+ pedal pulses. Abdomen: Soft, lower abdominal tenderness, nondistended, no rebound or guarding.  Suprapubic catheter in place.  No masses palpated.  Bowel sounds normoactive Musculoskeletal: FROM. no cyanosis. No joint deformity upper and lower extremities. Normal muscle tone.  Skin: Warm, dry, intact no rashes, lesions, ulcers. No induration Neurologic: Normal speech. Strength 5/5 in all  extremities.   Psychiatric: Normal judgment and insight.  Normal mood.    Labs on Admission: I have personally reviewed following labs and imaging studies  CBC: Recent Labs  Lab 02/20/21 1017 02/25/21 1215  WBC 7.1 6.3  HGB 11.5* 10.9*  HCT 34.0* 30.6*  MCV 85.2 82.3  PLT 192 518    Basic Metabolic Panel: Recent Labs  Lab 02/25/21 1215  NA 123*  K 4.9  CL 94*  CO2 23  GLUCOSE 167*  BUN 25*  CREATININE 1.29*  CALCIUM 8.7*    GFR: Estimated Creatinine Clearance: 39.3 mL/min (A) (by C-G formula based on SCr of 1.29 mg/dL (H)).  Liver Function Tests: Recent Labs  Lab 02/25/21 1215  AST 23  ALT 12  ALKPHOS 56  BILITOT 0.9  PROT 5.6*  ALBUMIN 3.3*    Urine analysis:    Component Value Date/Time   COLORURINE YELLOW 02/25/2021 1156   APPEARANCEUR CLOUDY (A) 02/25/2021 1156   LABSPEC 1.020 02/25/2021 1156   PHURINE 6.0 02/25/2021 1156   GLUCOSEU NEGATIVE 02/25/2021 1156   GLUCOSEU NEGATIVE 12/25/2020 1556   HGBUR LARGE (A) 02/25/2021 1156   BILIRUBINUR NEGATIVE 02/25/2021 1156   BILIRUBINUR negative 07/29/2017 1005   KETONESUR NEGATIVE 02/25/2021 1156   PROTEINUR >300 (A) 02/25/2021 1156   UROBILINOGEN 0.2 12/25/2020 1556   NITRITE NEGATIVE 02/25/2021 1156   LEUKOCYTESUR MODERATE (A) 02/25/2021 1156    Radiological Exams on Admission: CT ABDOMEN PELVIS W CONTRAST  Result Date: 02/25/2021 CLINICAL DATA:  Abdominal pain.  Suprapubic tenderness. EXAM: CT ABDOMEN AND PELVIS WITH CONTRAST TECHNIQUE: Multidetector CT imaging of the abdomen and pelvis was performed using the standard protocol following bolus administration of intravenous contrast. RADIATION DOSE REDUCTION: This exam was performed according to the departmental dose-optimization program which includes automated exposure control, adjustment of the mA and/or kV according to patient size and/or use of iterative reconstruction technique. CONTRAST:  147mL OMNIPAQUE IOHEXOL 300 MG/ML  SOLN COMPARISON:   02/05/2021 there is a FINDINGS: Lower chest: Small to moderate left pleural effusion which is similar in volume to the previous exam. Hepatobiliary: No focal liver abnormality. Gallbladder appears unremarkable. No biliary ductal dilatation. Pancreas: Unremarkable. No pancreatic ductal dilatation or surrounding inflammatory changes. Spleen: Normal in size without focal abnormality. Adrenals/Urinary Tract: Normal adrenal glands. Bilateral kidney cysts are again noted. The largest arises off the lateral cortex of interpolar right kidney measuring 3.6 cm. No kidney stones identified bilaterally. New bilateral pelvocaliectasis compared with 02/05/2021. There is also mild bilateral hydroureter to the level of the urinary bladder. Suprapubic pigtail catheter is identified within the thick walled urinary bladder. Several small foci of gas within the wall of the bladder dome, similar to previous exam, image 66/5. Stomach/Bowel: Stomach appears normal. No bowel wall thickening, inflammation, or distension. Sigmoid diverticulosis without signs of acute diverticulitis. Vascular/Lymphatic: Aortic atherosclerosis. No enlarged abdominal or pelvic lymph nodes. Reproductive: The prostate gland appears enlarged measuring 6.2 by 5.1 by 5.9 cm (volume = 98 cm^3) Other: No free fluid or fluid collections. Musculoskeletal: No acute or significant osseous findings. Lumbar spondylosis. Chronic bilateral L5 pars defects noted with first degree anterolisthesis of L5 on S1. IMPRESSION: 1. Suprapubic pigtail catheter appears appropriately positioned within decompressed, thick walled urinary bladder. There is mild upstream ureterectasis and pelvocaliectasis. 2. Enlarged prostate gland. 3. Small to moderate left pleural effusion. 4. Chronic bilateral L5 pars defects with first degree anterolisthesis of L5 on S1. 5. Aortic Atherosclerosis (ICD10-I70.0). Electronically Signed   By: Kerby Moors M.D.   On: 02/25/2021 13:52     Assessment/Plan Principal Problem:   Hyponatremia Mr. Hayes is admitted to Med Surg floor.  Check urine sodium and serum osmolality levels.  Fluid restriction of 1200 mL for the next 24 hours. Recheck electrolytes and renal function in the morning Most likely is secondary to increased water intake over the last few days.  Wife reports that he has been drinking 8-10  bottles of water that are 16 ounces each a day after she called their PCP and was told to increase his fluid intake a few days ago  Active Problems:   UTI (urinary tract infection) Patient with suprapubic catheter with lower abdominal pain, suprapubic and perineum pain since this morning.  UA has WBCs but no bacteria is obtained from suprapubic catheter. Patient was given ciprofloxacin in the emergency room.  We will change to Rocephin due to the significant side effects of ciprofloxacin in the elderly.  Discussed with pharmacy and patient has received Rocephin in the past with the last time being at the end of January and tolerated well. Urine culture obtained in the ER and will be monitored    CKD (chronic kidney disease) stage 3, GFR 30-59 ml/min  Stable.  Continue to monitor renal function with labs.  Continue home medications    Essential hypertension Continue home medications of Norvasc, metoprolol, isosorbide, Cozaar.  Monitor blood pressure    CAD (coronary artery disease) Continue Crestor, metoprolol, Cozaar, isosorbide.    HFrEF (heart failure with reduced ejection fraction) Continue home medications as above.  Patient on Lasix every other day with next dose being due tomorrow morning.  Monitor I&Os    Benign prostatic hyperplasia Continue Proscar and Cardura.    Essential tremor Controlled on primidone.  Continue    Suprapubic catheter   DVT prophylaxis: Lovenox for DVT prophylaxis Code Status:   Full code Family Communication:  Diagnosis and plan discussed with patient and his wife and daughter who  are at bedside.  They verbalized understanding agree with plan.  Further recommendations to follow as clinical indicated Disposition Plan:   Patient is from:  Home  Anticipated DC to:  Home  Anticipated DC date:  Anticipate 2 midnight stay   Admission status:  Inpatient  Yevonne Aline Jai Bear MD [facility]  How to contact the [facility] Attending or Consulting provider [place] or covering provider during after hours [place], for this patient?   Check the care team in [facility] and look for a) attending/consulting [facility] provider listed and b) the [facility] team listed Log into [email or web address] and use [place]'s universal password to access. If you do not have the password, please contact the hospital operator. Locate the [facility] provider you are looking for under [facility] and page to a number that you can be directly reached. If you still have difficulty reaching the provider, please page the [facility] (Director on Call) for the Hospitalists listed on amion for assistance.  02/25/2021, 3:04 PM

## 2021-02-25 NOTE — ED Provider Notes (Signed)
[facility] Provider Note    Event Date/Time   First MD Initiated Contact with Patient 02/25/21 1135     (approximate)   History   catheter issues   HPI  BRANTLEE PENN is a 88 y.o. male with a history of prostatomegaly, CHF, CAD who presents with complaints of lower abdominal pain.  Patient had suprapubic tube placed 6 days ago by IR, had been doing well but over the last several days has developed increasing pain in his lower abdomen, right greater than left and some discomfort around his perineum.  No fevers or chills.  Last night he had significant pain and went to the bathroom and actually urinated via his penis which is the first time he has done that since the procedure.  He does still have urine in his suprapubic tube     Physical Exam   Triage Vital Signs: ED Triage Vitals  Enc Vitals Group     BP 02/25/21 1130 99/68     Pulse Rate 02/25/21 1130 70     Resp 02/25/21 1130 20     Temp 02/25/21 1130 97.8 F (36.6 C)     Temp Source 02/25/21 1130 Oral     SpO2 02/25/21 1130 98 %     Weight 02/25/21 1126 78 kg (171 lb 15.3 oz)     Height 02/25/21 1126 1.778 m (5\' 10" )     Head Circumference --      Peak Flow --      Pain Score 02/25/21 1126 9     Pain Loc --      Pain Edu? --      Excl. in [place]? --     Most recent vital signs: Vitals:   02/25/21 1130 02/25/21 1429  BP: 99/68 133/66  Pulse: 70 64  Resp: 20 16  Temp: 97.8 F (36.6 C)   SpO2: 98% 95%     General: Awake, no distress.  CV:  Good peripheral perfusion.  Resp:  Normal effort.  Abd:  No distention.  Suprapubic tube in place, yellow urine in tube, clean dry intact, no evidence of surrounding infection.  Scrotum and penis is normal, no tenderness to palpation.  Mild tenderness right lower quadrant     ED Results / Procedures / Treatments   Labs (all labs ordered are listed, but only abnormal results are displayed) Labs Reviewed  CBC - Abnormal; Notable for the following  components:      Result Value   RBC 3.72 (*)    Hemoglobin 10.9 (*)    HCT 30.6 (*)    All other components within normal limits  COMPREHENSIVE METABOLIC PANEL - Abnormal; Notable for the following components:   Sodium 123 (*)    Chloride 94 (*)    Glucose, Bld 167 (*)    BUN 25 (*)    Creatinine, Ser 1.29 (*)    Calcium 8.7 (*)    Total Protein 5.6 (*)    Albumin 3.3 (*)    GFR, Estimated 53 (*)    All other components within normal limits  URINALYSIS, COMPLETE (UACMP) WITH MICROSCOPIC - Abnormal; Notable for the following components:   APPearance CLOUDY (*)    Hgb urine dipstick LARGE (*)    Protein, ur >300 (*)    Leukocytes,Ua MODERATE (*)    RBC / HPF >50 (*)    WBC, UA >50 (*)    All other components within normal limits  RESP PANEL BY RT-PCR (FLU  A&B, COVID) ARPGX2  SODIUM, URINE, RANDOM  OSMOLALITY     EKG     RADIOLOGY CT abdomen pelvis reviewed by me, no acute abnormality per radiology    PROCEDURES:  Critical Care performed:   Procedures   MEDICATIONS ORDERED IN ED: Medications  iohexol (OMNIPAQUE) 300 MG/ML solution 100 mL (100 mLs Intravenous Contrast Given 02/25/21 1326)     IMPRESSION / MDM / ASSESSMENT AND PLAN / ED COURSE  I reviewed the triage vital signs and the nursing notes.  Patient presents with lower abdominal pain and perineal pain status post suprapubic catheter placement.  No signs of infection around catheter insertion site, no discharge.  Tenderness noted right lower quadrant, he does feel pain in his perineum as well although exam there is normal, no erythema  Differential includes infection, dislodgment of suprapubic catheter, UTI  Will obtain urine, blood work, CT abdomen pelvis to assess positioning  Lab work demonstrates significant hyponatremia with a sodium of 123 elevated BUN/creatinine, normal white blood cell count.  Urinalysis demonstrates clumps of white blood cells, no clear bacteria, given recent instrumentation  possibility of UTI especially given his development of pain over the last 3 days.  Will cover with antibiotics  Will require admission, have consulted the hospitalist who is seen the patient in the emergency department. Hospitalist requested holding off on cipro IV          FINAL CLINICAL IMPRESSION(S) / ED DIAGNOSES   Final diagnoses:  Acute cystitis with hematuria  Hyponatremia     Rx / DC Orders   ED Discharge Orders     None        Note:  This document was prepared using Dragon voice recognition software and may include unintentional dictation errors.   Lavonia Drafts, MD 02/25/21 [phone]

## 2021-02-25 NOTE — ED Notes (Signed)
[employer] printer not working, lab samples sent with chart labels. IT ticket entered.

## 2021-02-25 NOTE — Plan of Care (Signed)

## 2021-02-25 NOTE — ED Notes (Signed)
Secretary will put in transport.

## 2021-02-26 DIAGNOSIS — R102 Pelvic and perineal pain: Secondary | ICD-10-CM | POA: Diagnosis not present

## 2021-02-26 DIAGNOSIS — T83510S Infection and inflammatory reaction due to cystostomy catheter, sequela: Secondary | ICD-10-CM

## 2021-02-26 DIAGNOSIS — I5020 Unspecified systolic (congestive) heart failure: Secondary | ICD-10-CM

## 2021-02-26 DIAGNOSIS — E871 Hypo-osmolality and hyponatremia: Principal | ICD-10-CM

## 2021-02-26 DIAGNOSIS — R1030 Lower abdominal pain, unspecified: Secondary | ICD-10-CM

## 2021-02-26 DIAGNOSIS — N401 Enlarged prostate with lower urinary tract symptoms: Secondary | ICD-10-CM

## 2021-02-26 DIAGNOSIS — I10 Essential (primary) hypertension: Secondary | ICD-10-CM

## 2021-02-26 DIAGNOSIS — N390 Urinary tract infection, site not specified: Secondary | ICD-10-CM

## 2021-02-26 DIAGNOSIS — N1831 Chronic kidney disease, stage 3a: Secondary | ICD-10-CM

## 2021-02-26 DIAGNOSIS — R338 Other retention of urine: Secondary | ICD-10-CM

## 2021-02-26 LAB — BASIC METABOLIC PANEL
Anion gap: 3 — ABNORMAL LOW (ref 5–15)
BUN: 28 mg/dL — ABNORMAL HIGH (ref 8–23)
CO2: 26 mmol/L (ref 22–32)
Calcium: 8.7 mg/dL — ABNORMAL LOW (ref 8.9–10.3)
Chloride: 97 mmol/L — ABNORMAL LOW (ref 98–111)
Creatinine, Ser: 1.37 mg/dL — ABNORMAL HIGH (ref 0.61–1.24)
GFR, Estimated: 49 mL/min — ABNORMAL LOW (ref >60)
Glucose, Bld: 99 mg/dL (ref 70–99)
Potassium: 4.6 mmol/L (ref 3.5–5.1)
Sodium: 126 mmol/L — ABNORMAL LOW (ref 135–145)

## 2021-02-26 MED ORDER — HYDROCODONE-ACETAMINOPHEN 5-325 MG PO TABS
1.0000 | ORAL_TABLET | Freq: Four times a day (QID) | ORAL | Status: DC | PRN
  Administered 2021-02-26 – 2021-03-02 (×11): 1 via ORAL
  Filled 2021-02-26 (×13): qty 1

## 2021-02-26 MED ORDER — ACETAMINOPHEN 650 MG RE SUPP: 650.0000 mg | Freq: Four times a day (QID) | RECTAL | Status: DC | PRN

## 2021-02-26 MED ORDER — OXYBUTYNIN CHLORIDE 5 MG PO TABS
5.0000 mg | ORAL_TABLET | Freq: Three times a day (TID) | ORAL | Status: DC | PRN
  Administered 2021-02-26: 5 mg via ORAL
  Filled 2021-02-26 (×2): qty 1

## 2021-02-26 MED ORDER — OXYBUTYNIN CHLORIDE 5 MG PO TABS
5.0000 mg | ORAL_TABLET | Freq: Once | ORAL | Status: AC
  Administered 2021-02-26: 5 mg via ORAL
  Filled 2021-02-26: qty 1

## 2021-02-26 MED ORDER — MIRABEGRON ER 25 MG PO TB24
25.0000 mg | ORAL_TABLET | Freq: Every day | ORAL | Status: DC
  Administered 2021-02-26 – 2021-02-27 (×2): 25 mg via ORAL
  Filled 2021-02-26 (×2): qty 1

## 2021-02-26 MED ORDER — METOPROLOL TARTRATE 25 MG PO TABS
12.5000 mg | ORAL_TABLET | Freq: Two times a day (BID) | ORAL | Status: DC
  Administered 2021-02-26 – 2021-03-04 (×11): 12.5 mg via ORAL
  Filled 2021-02-26 (×12): qty 1

## 2021-02-26 MED ORDER — ACETAMINOPHEN 325 MG PO TABS
650.0000 mg | ORAL_TABLET | Freq: Four times a day (QID) | ORAL | Status: DC | PRN
  Administered 2021-02-27 – 2021-02-28 (×4): 650 mg via ORAL
  Filled 2021-02-26 (×4): qty 2

## 2021-02-26 NOTE — Assessment & Plan Note (Addendum)
Patient is on Lasix every other day.  I decreased dose of metoprolol down to 12.5 mg twice a day.  Patient also on Cozaar.  Discontinue Norvasc with blood pressure being on the lower side.  EF 35 to 40% on outpatient echocardiogram.

## 2021-02-26 NOTE — Progress Notes (Signed)
Progress Note   Patient: Shawn Andrews BJY:[id number] DOB: 03-03-1928 DOA: 02/25/2021     1 DOS: the patient was seen and examined on 02/26/2021     Assessment and Plan: * Hyponatremia- (present on admission) Patient's sodium was 123 on presentation up to 126 today.  We will put on fluid restriction.  Urine sodium up at 52.  Lower abdominal pain Patient having lower abdominal and penis pain.  I did prescribe Ditropan.  I spoke with urology and they did not recommending changing the suprapubic catheter because it was just placed.  Placed on empiric antibiotics but this likely could be colonization rather than urinary tract infection.  UTI (urinary tract infection) With suprapubic catheter.  Could be colonization but will wait for urine culture results.  HFrEF (heart failure with reduced ejection fraction) ([place])- (present on admission) Patient is on Lasix every other day.  We will hold on Norvasc and decrease dose of metoprolol down to 12.5 mg twice a day.  Patient also on Cozaar.  CKD (chronic kidney disease) stage 3, GFR 30-59 ml/min (HCC)- (present on admission) Creatinine up at 1.37 today patient has CKD stage IIIa.  Benign prostatic hyperplasia- (present on admission) Continue finasteride and Cardura  Essential hypertension- (present on admission) With blood pressure available on the lower side will decrease metoprolol dose and get rid of Norvasc.  Continue Cozaar.      Subjective: Patient having some lower abdominal pain and pain in his penis.  I prescribed some short acting Ditropan to help out with bladder spasms.  Patient had suprapubic catheter placed last week.  Patient also found to have a low sodium.  Physical Exam: Vitals:   02/25/21 2051 02/26/21 0325 02/26/21 0720 02/26/21 1135  BP: 121/77 (!) 111/44 (!) 118/54 (!) 120/54  Pulse: 64 69 (!) 58 78  Resp: 16 16 20    Temp: 98.3 F (36.8 C) 97.8 F (36.6 C) 98.1 F (36.7 C) 97.7 F (36.5 C)  TempSrc:   Oral Oral   SpO2: 94% 96%  95%  Weight:      Height:       Physical Exam HENT:     Head: Normocephalic.     Mouth/Throat:     Pharynx: No oropharyngeal exudate.  Eyes:     General: Lids are normal.     Conjunctiva/sclera: Conjunctivae normal.  Cardiovascular:     Rate and Rhythm: Normal rate and regular rhythm.     Heart sounds: Normal heart sounds, S1 normal and S2 normal.  Pulmonary:     Breath sounds: No decreased breath sounds, wheezing, rhonchi or rales.  Abdominal:     Palpations: Abdomen is soft.     Tenderness: There is abdominal tenderness in the suprapubic area.  Musculoskeletal:     Right lower leg: No swelling.     Left lower leg: No swelling.  Skin:    General: Skin is warm.     Findings: No rash.  Neurological:     Mental Status: He is alert and oriented to person, place, and time.    Data Reviewed: Laboratory and radiological data reviewed from this hospital stay.  Outpatient sodium was also reviewed which range between 132 and 135.  Family Communication: Spoke with patient's wife at the bedside  Disposition: Status is: Inpatient Remains inpatient appropriate because: Sodium is still too low for disposition.  Patient also having lower abdominal pain  Planned Discharge Destination: Home  Author: Loletha Grayer, MD 02/26/2021 12:47 PM  For on call review  [email or web address]

## 2021-02-26 NOTE — Plan of Care (Signed)
Patient sleeping between care. Aox4. Pain medication offered overnight but patient refused. No new changes in assessment. Call bell within reach.  PLAN OF CARE ONGOING Problem: Education: Goal: Knowledge of General Education information will improve Description: Including pain rating scale, medication(s)/side effects and non-pharmacologic comfort measures Outcome: Progressing   Problem: Health Behavior/Discharge Planning: Goal: Ability to manage health-related needs will improve Outcome: Progressing   Problem: Clinical Measurements: Goal: Ability to maintain clinical measurements within normal limits will improve Outcome: Progressing Goal: Will remain free from infection Outcome: Progressing Goal: Diagnostic test results will improve Outcome: Progressing Goal: Respiratory complications will improve Outcome: Progressing Goal: Cardiovascular complication will be avoided Outcome: Progressing   Problem: Activity: Goal: Risk for activity intolerance will decrease Outcome: Progressing   Problem: Nutrition: Goal: Adequate nutrition will be maintained Outcome: Progressing   Problem: Coping: Goal: Level of anxiety will decrease Outcome: Progressing   Problem: Elimination: Goal: Will not experience complications related to bowel motility Outcome: Progressing Goal: Will not experience complications related to urinary retention Outcome: Progressing   Problem: Pain Managment: Goal: General experience of comfort will improve Outcome: Progressing   Problem: Safety: Goal: Ability to remain free from injury will improve Outcome: Progressing   Problem: Skin Integrity: Goal: Risk for impaired skin integrity will decrease Outcome: Progressing

## 2021-02-26 NOTE — Assessment & Plan Note (Addendum)
Today's creatinine up at 1.49.  The patient has CKD stage IIIa.  We will give IV fluid bolus today.

## 2021-02-26 NOTE — Assessment & Plan Note (Addendum)
Patient having lower abdominal and penis pain.  I did prescribe Ditropan.  Urology changed over to [facility].  Patient still having pain this morning and review does be rechecked this morning.  Advised to ambulate.  Unfortunately belladonna suppository is not on formulary anymore secondary to shortage.  Urine culture growing multiple organisms so likely contaminant.  Antibiotics discontinued.  CT scan of the abdomen pelvis on admission showed the suprapubic pigtail catheter appears appropriately positioned and the patient has a large prostate gland.  Abdominal flat and upright today shows normal gas pattern.  Bladder scan today did not show any retention.

## 2021-02-26 NOTE — Plan of Care (Signed)

## 2021-02-26 NOTE — Consult Note (Signed)
Urology Consult  Requesting physician: Dr. Leslye Peer  Reason for consultation: Lower abdominal pain, SP tube  Chief Complaint: Abdominal pain  History of Present Illness: Shawn Andrews is a 88 y.o. male with chronic urinary retention followed by our practice.  He has failed voiding trials and did not think he could perform intermittent catheterization.  He elected placement of an SP tube which was performed by IR 02/20/2021.  Presented to the ED yesterday complaining of spasmodic lower abdominal pain radiating to the penis.  Mild urine leakage per urethra  No fever or chills.  UA showed >50 WBC/RBC  Past Medical History:  Diagnosis Date   Abnormal drug screen 06/22/2014   inapprop neg xanax rpt 3 mo (06/2014)   BPH (benign prostatic hypertrophy) 01/21/1998   has had 3 biopsies in past ([facility]) decided to stop PSA/DRE   CHF (congestive heart failure) (HCC)    Coronary artery disease    Hyperlipidemia 01/21/2002   Hypertension 05/22/2003   Left lumbar radiculopathy    Osteoarthritis 01/22/1988   knees, lumbar spondylosis and listhesis    Past Surgical History:  Procedure Laterality Date   CATARACT EXTRACTION  2013   bilateral   ESI Left 08/2014, 11/2014, 08/2015   L S1, L L5/S1 transforaminal ESI; L4/5 L5/S1 zygapophysial injections, L S1 transforaminal ([facility])   FINGER SURGERY     right middle, partial traumatic amputation   KNEE ARTHROSCOPY  1990   right   KNEE SURGERY  04/2006   right partial knee replacement in [place] - rec ppx abx for any invasive procedure   LEFT HEART CATH AND CORONARY ANGIOGRAPHY N/A 01/18/2021   Procedure: LEFT HEART CATH AND CORONARY ANGIOGRAPHY;  Surgeon: Corey Skains, MD;  Location: [facility];  Service: Cardiovascular;  Laterality: N/A;    Home Medications:  Current Meds  Medication Sig   acetaminophen (TYLENOL 8 HOUR) 650 MG CR tablet Take 1 tablet (650 mg total) by mouth 2 (two) times a day.   amLODipine (NORVASC) 2.5 MG  tablet Take 1 tablet (2.5 mg total) by mouth daily.   atorvastatin (LIPITOR) 40 MG tablet Take 40 mg by mouth daily.   Cholecalciferol (VITAMIN D3) 25 MCG (1000 UT) CAPS Take 1 capsule (1,000 Units total) by mouth daily.   clopidogrel (PLAVIX) 75 MG tablet Take 1 tablet by mouth daily.   docusate sodium (COLACE) 100 MG capsule Take 1 capsule (100 mg total) by mouth daily.   doxazosin (CARDURA) 4 MG tablet TAKE 1 TABLET BY MOUTH  DAILY   famotidine (PEPCID) 20 MG tablet TAKE 1 TABLET BY MOUTH AT  BEDTIME   finasteride (PROSCAR) 5 MG tablet TAKE 1 TABLET BY MOUTH  DAILY   furosemide (LASIX) 20 MG tablet Take 1 tablet by mouth every other day.   isosorbide mononitrate (IMDUR) 30 MG 24 hr tablet Take 1 tablet by mouth daily.   loratadine (CLARITIN) 10 MG tablet Take 1 tablet (10 mg total) by mouth daily as needed for allergies.   losartan (COZAAR) 50 MG tablet Take 1 tablet (50 mg total) by mouth daily.   metoprolol tartrate (LOPRESSOR) 50 MG tablet Take 0.5 tablets (25 mg total) by mouth 2 (two) times daily.   primidone (MYSOLINE) 50 MG tablet Take 2 tablets (100 mg total) by mouth 3 (three) times daily.   rosuvastatin (CRESTOR) 5 MG tablet Take 5 mg by mouth daily.   vitamin B-12 (CYANOCOBALAMIN) 500 MCG tablet Take 1 tablet (500 mcg total) by mouth once  a week.    Allergies:  Allergies  Allergen Reactions   Penicillins Swelling    Of tongue Has patient had a PCN reaction causing immediate rash, facial/tongue/throat swelling, SOB or lightheadedness with hypotension: Yes Has patient had a PCN reaction causing severe rash involving mucus membranes or skin necrosis: No Has patient had a PCN reaction that required hospitalization: No Has patient had a PCN reaction occurring within the last 10 years: No If all of the above answers are "NO", then may proceed with Cephalosporin use.   Cialis [Tadalafil] Other (See Comments)    Indigestion and "felt like crap"   Lipitor [Atorvastatin] Other (See  Comments)    Severe myalgias   Lisinopril Cough    Dry nagging cough    Family History  Problem Relation Age of Onset   Hypertension Mother    Heart disease Brother 46       MI   Diabetes Brother    Cancer Daughter        melanoma   Stroke Neg Hx     Social History:  reports that he quit smoking about 20 years ago. His smoking use included pipe and cigarettes. His smokeless tobacco use includes chew. He reports current alcohol use. He reports that he does not use drugs.  ROS: A complete review of systems was performed.  All systems are negative except for pertinent findings as noted.  Physical Exam:  Vital signs in last 24 hours: Temp:  [97.7 F (36.5 C)-98.3 F (36.8 C)] 98.2 F (36.8 C) (02/06 1510) Pulse Rate:  [58-78] 66 (02/06 1510) Resp:  [16-20] 18 (02/06 1510) BP: (111-122)/(44-77) 122/51 (02/06 1510) SpO2:  [94 %-98 %] 98 % (02/06 1510) Constitutional:  Alert and oriented, No acute distress GI: Abdomen is soft, nontender, nondistended, no abdominal masses GU: SP tube draining clear urine   Laboratory Data:  Recent Labs    02/25/21 1215  WBC 6.3  HGB 10.9*  HCT 30.6*   Recent Labs    02/25/21 1215 02/26/21 0608  NA 123* 126*  K 4.9 4.6  CL 94* 97*  CO2 23 26  GLUCOSE 167* 99  BUN 25* 28*  CREATININE 1.29* 1.37*  CALCIUM 8.7* 8.7*   No results for input(s): LABPT, INR in the last 72 hours. No results for input(s): LABURIN in the last 72 hours. Results for orders placed or performed during the hospital encounter of 02/25/21  Resp Panel by RT-PCR (Flu A&B, Covid) Nasopharyngeal Swab     Status: None   Collection Time: 02/25/21  2:30 PM   Specimen: Nasopharyngeal Swab; Nasopharyngeal(NP) swabs in vial transport medium  Result Value Ref Range Status   SARS Coronavirus 2 by RT PCR NEGATIVE NEGATIVE Final    Comment: (NOTE) SARS-CoV-2 target nucleic acids are NOT DETECTED.  The SARS-CoV-2 RNA is generally detectable in upper respiratory specimens  during the acute phase of infection. The lowest concentration of SARS-CoV-2 viral copies this assay can detect is 138 copies/mL. A negative result does not preclude SARS-Cov-2 infection and should not be used as the sole basis for treatment or other patient management decisions. A negative result may occur with  improper specimen collection/handling, submission of specimen other than nasopharyngeal swab, presence of viral mutation(s) within the areas targeted by this assay, and inadequate number of viral copies(<138 copies/mL). A negative result must be combined with clinical observations, patient history, and epidemiological information. The expected result is Negative.  Fact Sheet for Patients:  EntrepreneurPulse.com.au  Fact Sheet for  Healthcare Providers:  IncredibleEmployment.be  This test is no t yet approved or cleared by the [place] and  has been authorized for detection and/or diagnosis of SARS-CoV-2 by FDA under an Emergency Use Authorization (EUA). This EUA will remain  in effect (meaning this test can be used) for the duration of the COVID-19 declaration under Section 564(b)(1) of the Act, 21 U.S.C.section 360bbb-3(b)(1), unless the authorization is terminated  or revoked sooner.       Influenza A by PCR NEGATIVE NEGATIVE Final   Influenza B by PCR NEGATIVE NEGATIVE Final    Comment: (NOTE) The Xpert Xpress SARS-CoV-2/FLU/RSV plus assay is intended as an aid in the diagnosis of influenza from Nasopharyngeal swab specimens and should not be used as a sole basis for treatment. Nasal washings and aspirates are unacceptable for Xpert Xpress SARS-CoV-2/FLU/RSV testing.  Fact Sheet for Patients: EntrepreneurPulse.com.au  Fact Sheet for Healthcare Providers: IncredibleEmployment.be  This test is not yet approved or cleared by the [place] FDA and has been authorized for detection  and/or diagnosis of SARS-CoV-2 by FDA under an Emergency Use Authorization (EUA). This EUA will remain in effect (meaning this test can be used) for the duration of the COVID-19 declaration under Section 564(b)(1) of the Act, 21 U.S.C. section 360bbb-3(b)(1), unless the authorization is terminated or revoked.  Performed at [facility], [address] [place], [place] [address]      Radiologic Imaging: CT images were personally reviewed.  SP tube is not in prostatic urethra.  Bladder decompressed  CT ABDOMEN PELVIS W CONTRAST  Result Date: 02/25/2021 CLINICAL DATA:  Abdominal pain.  Suprapubic tenderness. EXAM: CT ABDOMEN AND PELVIS WITH CONTRAST TECHNIQUE: Multidetector CT imaging of the abdomen and pelvis was performed using the standard protocol following bolus administration of intravenous contrast. RADIATION DOSE REDUCTION: This exam was performed according to the departmental dose-optimization program which includes automated exposure control, adjustment of the mA and/or kV according to patient size and/or use of iterative reconstruction technique. CONTRAST:  142mL OMNIPAQUE IOHEXOL 300 MG/ML  SOLN COMPARISON:  02/05/2021 there is a FINDINGS: Lower chest: Small to moderate left pleural effusion which is similar in volume to the previous exam. Hepatobiliary: No focal liver abnormality. Gallbladder appears unremarkable. No biliary ductal dilatation. Pancreas: Unremarkable. No pancreatic ductal dilatation or surrounding inflammatory changes. Spleen: Normal in size without focal abnormality. Adrenals/Urinary Tract: Normal adrenal glands. Bilateral kidney cysts are again noted. The largest arises off the lateral cortex of interpolar right kidney measuring 3.6 cm. No kidney stones identified bilaterally. New bilateral pelvocaliectasis compared with 02/05/2021. There is also mild bilateral hydroureter to the level of the urinary bladder. Suprapubic pigtail catheter is identified within the  thick walled urinary bladder. Several small foci of gas within the wall of the bladder dome, similar to previous exam, image 66/5. Stomach/Bowel: Stomach appears normal. No bowel wall thickening, inflammation, or distension. Sigmoid diverticulosis without signs of acute diverticulitis. Vascular/Lymphatic: Aortic atherosclerosis. No enlarged abdominal or pelvic lymph nodes. Reproductive: The prostate gland appears enlarged measuring 6.2 by 5.1 by 5.9 cm (volume = 98 cm^3) Other: No free fluid or fluid collections. Musculoskeletal: No acute or significant osseous findings. Lumbar spondylosis. Chronic bilateral L5 pars defects noted with first degree anterolisthesis of L5 on S1. IMPRESSION: 1. Suprapubic pigtail catheter appears appropriately positioned within decompressed, thick walled urinary bladder. There is mild upstream ureterectasis and pelvocaliectasis. 2. Enlarged prostate gland. 3. Small to moderate left pleural effusion. 4. Chronic bilateral L5 pars defects with first degree  anterolisthesis of L5 on S1. 5. Aortic Atherosclerosis (ICD10-I70.0). Electronically Signed   By: Kerby Moors M.D.   On: 02/25/2021 13:52    Impression/Assessment:  Pelvic pain most likely bladder spasms secondary to indwelling SP tube Urinalysis abnormal however most likely inflammatory changes secondary to recent SP tube placement   Recommendation:  Due to cognitive side effects of oxybutynin in elderly patients recommend discontinuing Myrbetriq 25 mg ordered As an outpatient if Myrbetriq not covered would recommend trospium 20 mg twice daily as it does not cross the blood-brain barrier   02/26/2021, 8:27 PM  John Giovanni,  MD

## 2021-02-26 NOTE — TOC Progression Note (Signed)
Transition of Care [facility]) - Progression Note    Patient Details  Name: DAYTONA RETANA MRN: [id number] Date of Birth: [birth year]  Transition of Care [facility]) CM/SW [place], RN Phone Number: 02/26/2021, 1:35 PM  Clinical Narrative:    Transition of Care (TOC) Screening Note   Patient Details  Name: ZOLLIE ELLERY Date of Birth: 1928-05-15   Transition of Care [facility]) CM/SW Contact:    Conception Oms, RN Phone Number: 02/26/2021, 1:35 PM    Transition of Care Department [facility]) has reviewed patient and no TOC needs have been identified at this time. We will continue to monitor patient advancement through interdisciplinary progression rounds. If new patient transition needs arise, please place a TOC consult.           Expected Discharge Plan and Services                                                 Social Determinants of Health (SDOH) Interventions    Readmission Risk Interventions No flowsheet data found.

## 2021-02-26 NOTE — Assessment & Plan Note (Addendum)
With suprapubic catheter.  Likely colonization.  Discontinued antibiotics with multiple species seen on urine culture.  UTI ruled out.

## 2021-02-26 NOTE — Assessment & Plan Note (Addendum)
Patient's sodium was 123 on presentation up to 128 today.  On fluid restriction.  Urine sodium up at 52.  Urine osmolarity 268.

## 2021-02-26 NOTE — Plan of Care (Signed)
°  Problem: Education: Goal: Knowledge of General Education information will improve Description: Including pain rating scale, medication(s)/side effects and non-pharmacologic comfort measures Outcome: Progressing   Problem: Clinical Measurements: Goal: Ability to maintain clinical measurements within normal limits will improve Outcome: Progressing   Problem: Clinical Measurements: Goal: Will remain free from infection Outcome: Progressing   Problem: Clinical Measurements: Goal: Respiratory complications will improve Outcome: Progressing   Problem: Elimination: Goal: Will not experience complications related to urinary retention Outcome: Progressing   Problem: Pain Managment: Goal: General experience of comfort will improve Outcome: Progressing   Problem: Safety: Goal: Ability to remain free from injury will improve Outcome: Progressing

## 2021-02-26 NOTE — Assessment & Plan Note (Addendum)
With blood pressure available on the lower side will decrease metoprolol dose and get rid of Norvasc.  Continue Cozaar.

## 2021-02-26 NOTE — Assessment & Plan Note (Signed)
Continue finasteride and Cardura

## 2021-02-27 ENCOUNTER — Inpatient Hospital Stay: Payer: Medicare Other

## 2021-02-27 DIAGNOSIS — G250 Essential tremor: Secondary | ICD-10-CM

## 2021-02-27 DIAGNOSIS — R1084 Generalized abdominal pain: Secondary | ICD-10-CM

## 2021-02-27 LAB — BASIC METABOLIC PANEL
Anion gap: 5 (ref 5–15)
BUN: 34 mg/dL — ABNORMAL HIGH (ref 8–23)
CO2: 23 mmol/L (ref 22–32)
Calcium: 9 mg/dL (ref 8.9–10.3)
Chloride: 100 mmol/L (ref 98–111)
Creatinine, Ser: 1.49 mg/dL — ABNORMAL HIGH (ref 0.61–1.24)
GFR, Estimated: 44 mL/min — ABNORMAL LOW (ref >60)
Glucose, Bld: 115 mg/dL — ABNORMAL HIGH (ref 70–99)
Potassium: 4.9 mmol/L (ref 3.5–5.1)
Sodium: 128 mmol/L — ABNORMAL LOW (ref 135–145)

## 2021-02-27 LAB — URINE CULTURE

## 2021-02-27 LAB — HEMOGLOBIN: Hemoglobin: 11.6 g/dL — ABNORMAL LOW (ref 13.0–17.0)

## 2021-02-27 MED ORDER — SODIUM CHLORIDE 0.9 % IV BOLUS
250.0000 mL | Freq: Once | INTRAVENOUS | Status: AC
  Administered 2021-02-27: 250 mL via INTRAVENOUS

## 2021-02-27 MED ORDER — MIRABEGRON ER 25 MG PO TB24
25.0000 mg | ORAL_TABLET | Freq: Every day | ORAL | Status: DC
  Administered 2021-02-28: 25 mg via ORAL
  Filled 2021-02-27 (×2): qty 1

## 2021-02-27 MED ORDER — OXYCODONE HCL 5 MG PO TABS
2.5000 mg | ORAL_TABLET | Freq: Once | ORAL | Status: AC
  Administered 2021-02-27: 2.5 mg via ORAL
  Filled 2021-02-27: qty 1

## 2021-02-27 MED ORDER — BELLADONNA ALKALOIDS-OPIUM 16.2-30 MG RE SUPP: 1.0000 | Freq: Three times a day (TID) | RECTAL | Status: DC | PRN

## 2021-02-27 NOTE — Hospital Course (Signed)
88 year old man with past medical history of congestive heart failure with reduced ejection fraction, BPH, hyperlipidemia, hypertension, osteoarthritis.  He recently had a suprapubic catheter placed on 02/20/2021.  Patient coming in with a lot of lower abdominal pain and penis pain and initially started on antibiotics for suspected UTI.  I started the patient on short acting Ditropan for bladder spasms.  Dr. Bernardo Heater urology changed over to [facility] daily dosing.  Patient still having lower abdominal pain.

## 2021-02-27 NOTE — Progress Notes (Signed)
Progress Note   Patient: Shawn Andrews RDE:[id number] DOB: 22-Oct-1928 DOA: 02/25/2021     2 DOS: the patient was seen and examined on 02/27/2021   Brief hospital course: 88 year old man with past medical history of congestive heart failure with reduced ejection fraction, BPH, hyperlipidemia, hypertension, osteoarthritis.  He recently had a suprapubic catheter placed on 02/20/2021.  Patient coming in with a lot of lower abdominal pain and penis pain and initially started on antibiotics for suspected UTI.  I started the patient on short acting Ditropan for bladder spasms.  Dr. Bernardo Heater urology changed over to [facility] daily dosing.  Patient still having lower abdominal pain.  Assessment and Plan: * Hyponatremia- (present on admission) Patient's sodium was 123 on presentation up to 128 today.  On fluid restriction.  Urine sodium up at 52.  Urine osmolarity 268.  Lower abdominal pain Patient having lower abdominal and penis pain.  I did prescribe Ditropan.  Urology changed over to [facility].  Patient still having pain this morning and review does be rechecked this morning.  Advised to ambulate.  Unfortunately belladonna suppository is not on formulary anymore secondary to shortage.  Urine culture growing multiple organisms so likely contaminant.  Antibiotics discontinued.  CT scan of the abdomen pelvis on admission showed the suprapubic pigtail catheter appears appropriately positioned and the patient has a large prostate gland.  Abdominal flat and upright today shows normal gas pattern.  Bladder scan today did not show any retention.  UTI (urinary tract infection) With suprapubic catheter.  Likely colonization.  Discontinued antibiotics with multiple species seen on urine culture.  UTI ruled out.  HFrEF (heart failure with reduced ejection fraction) ([place])- (present on admission) Patient is on Lasix every other day.  I decreased dose of metoprolol down to 12.5 mg twice a day.  Patient also on Cozaar.   Discontinue Norvasc with blood pressure being on the lower side.  EF 35 to 40% on outpatient echocardiogram.  CKD (chronic kidney disease) stage 3, GFR 30-59 ml/min (HCC)- (present on admission) Today's creatinine up at 1.49.  The patient has CKD stage IIIa.  We will give IV fluid bolus today.  Benign prostatic hyperplasia- (present on admission) Continue finasteride and Cardura  Essential hypertension- (present on admission) With blood pressure available on the lower side will decrease metoprolol dose and get rid of Norvasc.  Continue Cozaar.        Subjective: Patient still having lower abdominal pain which spread to his upper abdomen.  Was in a lot of discomfort this morning.  Urology changed the antispasmodic to Myrbetriq.  Physical Exam: Vitals:   02/27/21 0401 02/27/21 0806 02/27/21 1121 02/27/21 1539  BP: 114/62 121/81 (!) 144/59 (!) 111/53  Pulse: 67 63 62 69  Resp: 18 16 18 18   Temp: (!) 97 F (36.1 C) 97.6 F (36.4 C) 97.9 F (36.6 C) 98.3 F (36.8 C)  TempSrc:      SpO2: 97% 95% 97% 97%  Weight:      Height:       Physical Exam HENT:     Head: Normocephalic.     Mouth/Throat:     Pharynx: No oropharyngeal exudate.  Eyes:     General: Lids are normal.     Conjunctiva/sclera: Conjunctivae normal.  Cardiovascular:     Rate and Rhythm: Normal rate and regular rhythm.     Heart sounds: Normal heart sounds, S1 normal and S2 normal.  Pulmonary:     Breath sounds: No decreased breath sounds, wheezing,  rhonchi or rales.  Abdominal:     Palpations: Abdomen is soft.     Tenderness: There is generalized abdominal tenderness.  Musculoskeletal:     Right lower leg: No swelling.     Left lower leg: No swelling.  Skin:    General: Skin is warm.     Findings: No rash.  Neurological:     Mental Status: He is alert and oriented to person, place, and time.     Data Reviewed: CT abdomen pelvis reviewed from admission, abdominal flat and upright reviewed.   Creatinine up at 1.49 today.  Sodium up a little bit to 128 today.  Hemoglobin 11.6   Family Communication: Spoke with wife at the bedside  Disposition: Status is: Inpatient Remains inpatient appropriate because: Still having quite a bit of abdominal pain  Planned Discharge Destination: Home  Author: Loletha Grayer, MD 02/27/2021 3:59 PM  For on call review [email or web address].

## 2021-02-28 ENCOUNTER — Telehealth: Payer: Self-pay | Admitting: Family Medicine

## 2021-02-28 DIAGNOSIS — E871 Hypo-osmolality and hyponatremia: Secondary | ICD-10-CM | POA: Diagnosis not present

## 2021-02-28 DIAGNOSIS — R1030 Lower abdominal pain, unspecified: Secondary | ICD-10-CM | POA: Diagnosis not present

## 2021-02-28 DIAGNOSIS — N3289 Other specified disorders of bladder: Secondary | ICD-10-CM | POA: Diagnosis not present

## 2021-02-28 DIAGNOSIS — N4889 Other specified disorders of penis: Secondary | ICD-10-CM

## 2021-02-28 LAB — BASIC METABOLIC PANEL
Anion gap: 7 (ref 5–15)
BUN: 41 mg/dL — ABNORMAL HIGH (ref 8–23)
CO2: 23 mmol/L (ref 22–32)
Calcium: 8.6 mg/dL — ABNORMAL LOW (ref 8.9–10.3)
Chloride: 98 mmol/L (ref 98–111)
Creatinine, Ser: 1.53 mg/dL — ABNORMAL HIGH (ref 0.61–1.24)
GFR, Estimated: 43 mL/min — ABNORMAL LOW (ref >60)
Glucose, Bld: 119 mg/dL — ABNORMAL HIGH (ref 70–99)
Potassium: 4.6 mmol/L (ref 3.5–5.1)
Sodium: 128 mmol/L — ABNORMAL LOW (ref 135–145)

## 2021-02-28 LAB — HEMOGLOBIN: Hemoglobin: 10.7 g/dL — ABNORMAL LOW (ref 13.0–17.0)

## 2021-02-28 MED ORDER — TROSPIUM CHLORIDE 20 MG PO TABS
20.0000 mg | ORAL_TABLET | Freq: Two times a day (BID) | ORAL | Status: DC
  Administered 2021-02-28 – 2021-03-04 (×8): 20 mg via ORAL
  Filled 2021-02-28 (×9): qty 1

## 2021-02-28 MED ORDER — MIRABEGRON ER 50 MG PO TB24
50.0000 mg | ORAL_TABLET | Freq: Every day | ORAL | Status: DC
  Administered 2021-03-01 – 2021-03-04 (×4): 50 mg via ORAL
  Filled 2021-02-28 (×4): qty 1

## 2021-02-28 MED ORDER — DIAZEPAM 5 MG/ML IJ SOLN
2.5000 mg | INTRAMUSCULAR | Status: DC | PRN
  Administered 2021-03-01 – 2021-03-02 (×3): 2.5 mg via INTRAVENOUS
  Filled 2021-02-28 (×3): qty 2

## 2021-02-28 MED ORDER — MORPHINE SULFATE (PF) 2 MG/ML IV SOLN
1.0000 mg | INTRAVENOUS | Status: DC | PRN
  Administered 2021-02-28 – 2021-03-01 (×5): 1 mg via INTRAVENOUS
  Filled 2021-02-28 (×6): qty 1

## 2021-02-28 MED ORDER — MIRABEGRON ER 25 MG PO TB24
25.0000 mg | ORAL_TABLET | Freq: Once | ORAL | Status: AC
  Administered 2021-02-28: 25 mg via ORAL
  Filled 2021-02-28: qty 1

## 2021-02-28 NOTE — Care Management Important Message (Signed)
Important Message  Patient Details  Name: SINAN TUCH MRN: [id number] Date of Birth: 11/19/1928   Medicare Important Message Given:  N/A - LOS <3 / Initial given by admissions     Dannette Barbara 02/28/2021, 8:50 AM

## 2021-02-28 NOTE — Progress Notes (Signed)
02/28/21  Subjective: Called by patient's family today in the office as well as contact with hospitalist that he continues to have severe episodes of pain starting in the lower abdomen radiating to his perineum and on his penis.  Last night was a terrible nightmare pending these getting worse.  He is currently on Myrbetriq 25 mg.  Objective:  Vitals and labs were reviewed Patient appears comfortable today, family at bedside somewhat agitated and concerned about communication issues Abdomen soft, nontender.  SP tube in place draining clear yellow urine.  Phallus was inspected, no evidence of induration, erythema or any penile physical exam abnormalities.  Perineum soft, nontender.  Assessment and plan:  1.  Severe bladder spasms-based on the patient's symptoms, most consistent with severe episodic bladder spasms.  CT scan was reviewed, no evidence of any intra-abdominal or bladder penile pathology.  Exam is also reassuring today.  We had a lengthy discussion today about various management options.  Unfortunately, BNO suppositories which are no longer available.  We did increase the dose of Myrbetriq to 50 mg starting tomorrow, a single one-time dose of 25 mg now.  I also spoke with pharmacy was able to approve Sanctura 20 mg twice daily emergent to minimize CNS effects to help with his bladder spasms.  I also have ordered IV Valium for overnight if he has extreme severe bladder spasms.  We discussed alternatives including removal of the SP tube and reverting back to Foley catheter.  He continues to believe that he has not been to be able to self cath due to tremulousness.  We will also tentatively plan to exchange his fairly rigid 16 French pigtail catheter which may be exacerbating his bladder spasms and discomfort to a softer 16 French catheter but this will need to be done in an office setting over a wire.  It may be premature to do this at this time we will try to move this to 1 to 2 weeks from  now rather than waiting a full 4 weeks as planned.  Apologized for communication issues and patient's frustration today.  Reassured them that we will continue to round him on a daily basis until we can hopefully get his bladder discomfort under control.  Hollice Espy, MD

## 2021-02-28 NOTE — Telephone Encounter (Signed)
Patient's wife called and states patient has been in the hospital since Sunday with penile pain, post SPT placement. She wants a Dealer to see the patient. I informed her that Dr. Bernardo Heater saw patient on Monday and Myrbetriq was recommended for the bladder spasms as Oxybutynin is not recommended for the patient's age. She states his penile pain has progressively gotten worse in the last couple days and she wants answers.

## 2021-02-28 NOTE — Progress Notes (Signed)
PROGRESS NOTE    Shawn Andrews  PZW:[id number] DOB: 05-Sep-1928 DOA: 02/25/2021 PCP: Leonel Ramsay, MD    Assessment & Plan:   Principal Problem:   Hyponatremia Active Problems:   Essential hypertension   Benign prostatic hyperplasia   Essential tremor   CKD (chronic kidney disease) stage 3, GFR 30-59 ml/min (HCC)   CAD (coronary artery disease)   HFrEF (heart failure with reduced ejection fraction) (HCC)   UTI (urinary tract infection)   Suprapubic catheter (HCC)   Lower abdominal pain   Generalized abdominal pain   Hyponatremia: stable from day prior. Continue w/ fluid restriction    Lower abdominal pain: w/ penis pain. Likely secondary bladder spasms. Urology consulted as per pt & pt's family request.    UTI: w/ suprapubic catheter.  Likely colonization.  Discontinued antibiotics with multiple species seen on urine culture.  UTI ruled out.   Chronic systolic CHF: continue on metoprolol, losartan, lasix. Monitor I/Os. Amlodipine was d/c as BP is on lower end of normal   CKDIIIa: Cr is trending up from day prior. Avoid nephrotoxic meds    BPH: continue on home dose of finasteride, cardura    HTN: continue on losartan, metoprolol (reduced dose),    DVT prophylaxis: lovenox  Code Status: full  Family Communication: discussed pt's care w/ pt's family at bedside and answered their questions  Disposition Plan: likely d/c back home   Level of care: Med-Surg  Status is: Inpatient Remains inpatient appropriate because: severity of illnes     Consultants:  Urology   Procedures:  Antimicrobials:   Subjective: Pt c/o pain at the base of his penis   Objective: Vitals:   02/27/21 1936 02/27/21 2043 02/28/21 0438 02/28/21 0732  BP: (!) 125/51 (!) 128/58 (!) 127/56 (!) 151/63  Pulse: 71 68 (!) 55 60  Resp: 18  18 18   Temp: 98.7 F (37.1 C)  97.6 F (36.4 C) 98.6 F (37 C)  TempSrc:      SpO2: 97%  95% 94%  Weight:      Height:        Intake/Output  Summary (Last 24 hours) at 02/28/2021 0904 Last data filed at 02/28/2021 0859 Gross per 24 hour  Intake --  Output 3850 ml  Net -3850 ml   Filed Weights   02/25/21 1126  Weight: 78 kg    Examination:  General exam: Appears calm but  uncomfortable  Respiratory system: Clear to auscultation. Respiratory effort normal. Cardiovascular system: S1 & S2 +. No rubs, gallops or clicks.  Gastrointestinal system: Abdomen is nondistended, soft and nontender. Normal bowel sounds heard. Central nervous system: Alert and oriented. Moves all extremities  Psychiatry: Judgement and insight appear normal. Flat mood and affect     Data Reviewed: I have personally reviewed following labs and imaging studies  CBC: Recent Labs  Lab 02/25/21 1215 02/27/21 0354 02/28/21 0317  WBC 6.3  --   --   HGB 10.9* 11.6* 10.7*  HCT 30.6*  --   --   MCV 82.3  --   --   PLT 198  --   --    Basic Metabolic Panel: Recent Labs  Lab 02/25/21 1215 02/26/21 0608 02/27/21 0354 02/28/21 0317  NA 123* 126* 128* 128*  K 4.9 4.6 4.9 4.6  CL 94* 97* 100 98  CO2 23 26 23 23   GLUCOSE 167* 99 115* 119*  BUN 25* 28* 34* 41*  CREATININE 1.29* 1.37* 1.49* 1.53*  CALCIUM 8.7* 8.7*  9.0 8.6*   GFR: Estimated Creatinine Clearance: 33.1 mL/min (A) (by C-G formula based on SCr of 1.53 mg/dL (H)). Liver Function Tests: Recent Labs  Lab 02/25/21 1215  AST 23  ALT 12  ALKPHOS 56  BILITOT 0.9  PROT 5.6*  ALBUMIN 3.3*   No results for input(s): LIPASE, AMYLASE in the last 168 hours. No results for input(s): AMMONIA in the last 168 hours. Coagulation Profile: No results for input(s): INR, PROTIME in the last 168 hours. Cardiac Enzymes: No results for input(s): CKTOTAL, CKMB, CKMBINDEX, TROPONINI in the last 168 hours. BNP (last 3 results) No results for input(s): PROBNP in the last 8760 hours. HbA1C: No results for input(s): HGBA1C in the last 72 hours. CBG: No results for input(s): GLUCAP in the last 168  hours. Lipid Profile: No results for input(s): CHOL, HDL, LDLCALC, TRIG, CHOLHDL, LDLDIRECT in the last 72 hours. Thyroid Function Tests: No results for input(s): TSH, T4TOTAL, FREET4, T3FREE, THYROIDAB in the last 72 hours. Anemia Panel: No results for input(s): VITAMINB12, FOLATE, FERRITIN, TIBC, IRON, RETICCTPCT in the last 72 hours. Sepsis Labs: No results for input(s): PROCALCITON, LATICACIDVEN in the last 168 hours.  Recent Results (from the past 240 hour(s))  Urine Culture     Status: Abnormal   Collection Time: 02/25/21 11:56 AM   Specimen: Urine, Suprapubic  Result Value Ref Range Status   Specimen Description   Final    URINE, SUPRAPUBIC Performed at [facility], [address], [place], [place] [address]    Special Requests   Final    NONE Performed at [facility], [address] [place], [place] [address]    Culture MULTIPLE SPECIES PRESENT, SUGGEST RECOLLECTION (A)  Final   Report Status 02/27/2021 FINAL  Final  Resp Panel by RT-PCR (Flu A&B, Covid) Nasopharyngeal Swab     Status: None   Collection Time: 02/25/21  2:30 PM   Specimen: Nasopharyngeal Swab; Nasopharyngeal(NP) swabs in vial transport medium  Result Value Ref Range Status   SARS Coronavirus 2 by RT PCR NEGATIVE NEGATIVE Final    Comment: (NOTE) SARS-CoV-2 target nucleic acids are NOT DETECTED.  The SARS-CoV-2 RNA is generally detectable in upper respiratory specimens during the acute phase of infection. The lowest concentration of SARS-CoV-2 viral copies this assay can detect is 138 copies/mL. A negative result does not preclude SARS-Cov-2 infection and should not be used as the sole basis for treatment or other patient management decisions. A negative result may occur with  improper specimen collection/handling, submission of specimen other than nasopharyngeal swab, presence of viral mutation(s) within the areas targeted by this assay, and inadequate number of  viral copies(<138 copies/mL). A negative result must be combined with clinical observations, patient history, and epidemiological information. The expected result is Negative.  Fact Sheet for Patients:  EntrepreneurPulse.com.au  Fact Sheet for Healthcare Providers:  IncredibleEmployment.be  This test is no t yet approved or cleared by the [place] FDA and  has been authorized for detection and/or diagnosis of SARS-CoV-2 by FDA under an Emergency Use Authorization (EUA). This EUA will remain  in effect (meaning this test can be used) for the duration of the COVID-19 declaration under Section 564(b)(1) of the Act, 21 U.S.C.section 360bbb-3(b)(1), unless the authorization is terminated  or revoked sooner.       Influenza A by PCR NEGATIVE NEGATIVE Final   Influenza B by PCR NEGATIVE NEGATIVE Final    Comment: (NOTE) The Xpert Xpress SARS-CoV-2/FLU/RSV plus assay is intended as  an aid in the diagnosis of influenza from Nasopharyngeal swab specimens and should not be used as a sole basis for treatment. Nasal washings and aspirates are unacceptable for Xpert Xpress SARS-CoV-2/FLU/RSV testing.  Fact Sheet for Patients: EntrepreneurPulse.com.au  Fact Sheet for Healthcare Providers: IncredibleEmployment.be  This test is not yet approved or cleared by the [place] FDA and has been authorized for detection and/or diagnosis of SARS-CoV-2 by FDA under an Emergency Use Authorization (EUA). This EUA will remain in effect (meaning this test can be used) for the duration of the COVID-19 declaration under Section 564(b)(1) of the Act, 21 U.S.C. section 360bbb-3(b)(1), unless the authorization is terminated or revoked.  Performed at [facility], [address], [place], [place] [address]          Radiology Studies: DG Abd 2 Views  Result Date: 02/27/2021 CLINICAL DATA:  Lower abdominal pain  EXAM: ABDOMEN - 2 VIEW COMPARISON:  CT done on 02/25/2021 FINDINGS: Bowel gas pattern is nonspecific. Moderate amount of stool is seen in the colon and rectum. Suprapubic cystostomy catheter is seen. There is breathing motion in the upright radiograph limiting evaluation for pneumoperitoneum. As far as seen, there is no definite demonstrable pneumoperitoneum. IMPRESSION: Nonspecific bowel gas pattern. Electronically Signed   By: Elmer Picker M.D.   On: 02/27/2021 10:22        Scheduled Meds:  clopidogrel  75 mg Oral Daily   docusate sodium  100 mg Oral Daily   doxazosin  4 mg Oral Daily   enoxaparin (LOVENOX) injection  40 mg Subcutaneous Q24H   famotidine  20 mg Oral QHS   finasteride  5 mg Oral Daily   furosemide  20 mg Oral QODAY   isosorbide mononitrate  30 mg Oral Daily   losartan  50 mg Oral Daily   metoprolol tartrate  12.5 mg Oral BID   mirabegron ER  25 mg Oral Daily   primidone  100 mg Oral TID   rosuvastatin  5 mg Oral Daily   sodium chloride flush  3 mL Intravenous Q12H   Continuous Infusions:  sodium chloride 250 mL (02/25/21 1732)     LOS: 3 days    Time spent: 35 mins     Wyvonnia Dusky, MD [facility] Pager [phone]  If 7PM-7AM, please contact night-coverage 02/28/2021, 9:04 AM

## 2021-03-01 DIAGNOSIS — N3289 Other specified disorders of bladder: Secondary | ICD-10-CM | POA: Diagnosis not present

## 2021-03-01 DIAGNOSIS — K5900 Constipation, unspecified: Secondary | ICD-10-CM

## 2021-03-01 DIAGNOSIS — E875 Hyperkalemia: Secondary | ICD-10-CM

## 2021-03-01 DIAGNOSIS — R1030 Lower abdominal pain, unspecified: Secondary | ICD-10-CM | POA: Diagnosis not present

## 2021-03-01 DIAGNOSIS — E871 Hypo-osmolality and hyponatremia: Secondary | ICD-10-CM | POA: Diagnosis not present

## 2021-03-01 LAB — CBC
HCT: 29.6 % — ABNORMAL LOW (ref 39.0–52.0)
Hemoglobin: 10.2 g/dL — ABNORMAL LOW (ref 13.0–17.0)
MCH: 28.4 pg (ref 26.0–34.0)
MCHC: 34.5 g/dL (ref 30.0–36.0)
MCV: 82.5 fL (ref 80.0–100.0)
Platelets: 212 10*3/uL (ref 150–400)
RBC: 3.59 MIL/uL — ABNORMAL LOW (ref 4.22–5.81)
RDW: 14.2 % (ref 11.5–15.5)
WBC: 6.4 10*3/uL (ref 4.0–10.5)
nRBC: 0 % (ref 0.0–0.2)

## 2021-03-01 LAB — BASIC METABOLIC PANEL
Anion gap: 6 (ref 5–15)
BUN: 37 mg/dL — ABNORMAL HIGH (ref 8–23)
CO2: 26 mmol/L (ref 22–32)
Calcium: 8.5 mg/dL — ABNORMAL LOW (ref 8.9–10.3)
Chloride: 98 mmol/L (ref 98–111)
Creatinine, Ser: 1.41 mg/dL — ABNORMAL HIGH (ref 0.61–1.24)
GFR, Estimated: 47 mL/min — ABNORMAL LOW (ref >60)
Glucose, Bld: 96 mg/dL (ref 70–99)
Potassium: 5.2 mmol/L — ABNORMAL HIGH (ref 3.5–5.1)
Sodium: 130 mmol/L — ABNORMAL LOW (ref 135–145)

## 2021-03-01 MED ORDER — SODIUM CHLORIDE 0.9 % IV BOLUS
1000.0000 mL | Freq: Once | INTRAVENOUS | Status: AC
  Administered 2021-03-01: 1000 mL via INTRAVENOUS

## 2021-03-01 MED ORDER — SODIUM ZIRCONIUM CYCLOSILICATE 10 G PO PACK
10.0000 g | PACK | Freq: Once | ORAL | Status: AC
  Administered 2021-03-01: 10 g via ORAL
  Filled 2021-03-01: qty 1

## 2021-03-01 MED ORDER — BISACODYL 5 MG PO TBEC
10.0000 mg | DELAYED_RELEASE_TABLET | Freq: Every day | ORAL | Status: DC
  Administered 2021-03-01 – 2021-03-04 (×3): 10 mg via ORAL
  Filled 2021-03-01 (×4): qty 2

## 2021-03-01 MED ORDER — LIDOCAINE HCL 1 % IJ SOLN
INTRAMUSCULAR | Status: AC
  Filled 2021-03-01: qty 20

## 2021-03-01 MED ORDER — POLYETHYLENE GLYCOL 3350 17 G PO PACK
17.0000 g | PACK | Freq: Every day | ORAL | Status: DC
  Administered 2021-03-02: 17 g via ORAL
  Filled 2021-03-01: qty 1

## 2021-03-01 NOTE — Evaluation (Addendum)
Occupational Therapy Evaluation Patient Details Name: Shawn Andrews MRN: [id number] DOB: 06-13-1928 Today's Date: 03/01/2021   History of Present Illness Pt is a 88 y/o M with PMH: CAD, HFrEF, BPH and chronic urine retention followed by [facility] (s/p suprapubic catheter placed on 02/20/21). Presented to ED d/t spasmodic lower abdominal pain radiating to the penis. In addition, found to be hyponatremic.   Clinical Impression   Pt seen for OT evaluation this date in setting of acute hospitalization d/t lower abdominal pain and hyponatremia. He reports being INDEP at baseline including driving and working out at [employer]. He presents this date with good functional strength, but very limited activity tolerance. HE is able to perform seated ADLs at his baseline, but requires CGA for standing ADLs for safety and balance. He is able to come to standing with no AD and SUPV only, but does require RW for fxl mobility for balance and safety. Within ~10-15' fxl mobility towards door of room, his knees start buckling and he has essentially no control and starts going towards the floor. OT prevents fall and RN an dpt's spouse assist in getting chair. Pt found to be orthostatic and is returned to bed end of session with MIN A. BP is as follows:  Sitting after walking: 86/52 (63) Seated after exercises to improve circulation: 92/44 (60) Standing: 69/48 (55) Sitting with legs elevated: 98/49 (60) Laying in bed: 97/49 (63)  RN and MD notified of findings. Will continue to follow. At this time recommending [facility] f/u once medically stable.      Recommendations for follow up therapy are one component of a multi-disciplinary discharge planning process, led by the attending physician.  Recommendations may be updated based on patient status, additional functional criteria and insurance authorization.   Follow Up Recommendations  Home health OT    Assistance Recommended at Discharge Frequent or  constant Supervision/Assistance  Patient can return home with the following A little help with bathing/dressing/bathroom;Assistance with cooking/housework;Assist for transportation;Help with stairs or ramp for entrance    Functional Status Assessment  Patient has had a recent decline in their functional status and demonstrates the ability to make significant improvements in function in a reasonable and predictable amount of time.  Equipment Recommendations  BSC/3in1;Other (comment) (2ww)    Recommendations for Other Services       Precautions / Restrictions Precautions Precautions: Fall Restrictions Weight Bearing Restrictions: No Other Position/Activity Restrictions: significant orthostatic hypotension.      Mobility Bed Mobility Overal bed mobility: Modified Independent                  Transfers Overall transfer level: Needs assistance Equipment used: None Transfers: Sit to/from Stand Sit to Stand: Supervision           General transfer comment: increased time, SBA for safety, RW used for fxl mobility      Balance Overall balance assessment: Mild deficits observed, not formally tested                                         ADL either performed or assessed with clinical judgement   ADL                                         General ADL Comments: SETUP for  seated UB ADLs, CGA for seated LB ADLs d/t dynamic nature of the task making pt BP drop. SUPV with no AD for transfers, CGA/MIN A with RW for safety wtih fxl mobility d/t asymptomatic orthostatic hypotension.     Vision Baseline Vision/History: 1 Wears glasses Patient Visual Report: No change from baseline       Perception     Praxis      Pertinent Vitals/Pain Pain Assessment Pain Assessment: Faces Faces Pain Scale: Hurts little more Pain Location: penis/pubic area Pain Descriptors / Indicators: Cramping, Spasm Pain Intervention(s): Monitored during  session, Repositioned     Hand Dominance     Extremity/Trunk Assessment Upper Extremity Assessment Upper Extremity Assessment: Overall WFL for tasks assessed (in great physical condition considering age)   Lower Extremity Assessment Lower Extremity Assessment: Overall WFL for tasks assessed       Communication Communication Communication: No difficulties   Cognition Arousal/Alertness: Awake/alert Behavior During Therapy: WFL for tasks assessed/performed Overall Cognitive Status: Within Functional Limits for tasks assessed                                 General Comments: appropriate, oriented, pleasant     General Comments  knee buckling does occur when pt's BP drops. He is unaware BP is dropping and has no c/o light-headedness. BPs taken as follows: After initial ~7-8' fxl mobility with RW, pt's knees buckle and he is placed in sitting (RN and pt's spouse help bring a chair as he has near-syncopal episode) 86/52 (63). Engaged pt in seated exercise with some improvement to 92/44 (60). Standing after 30 seconds: 69/48 (55). Sat back down, LEs elevated, ankle pumps completed with improvement to 98/49 (60). Pt chair scooted next to bed for safety and SPS transfer back to bed. 97/49 (63) once back to bed laying.    Exercises Other Exercises Other Exercises: OT ed with pt and spouse re: safety considerations and fall prevention including taking BP at home, sitting EOB when first getting up and starting ankle pumps, march-in-place to increase circulation, and standing before starting to walk to assess for dizziness,   Shoulder Instructions      Home Living Family/patient expects to be discharged to:: Private residence Living Arrangements: Spouse/significant other Available Help at Discharge: Family;Available 24 hours/day Type of Home: House       Home Layout: One level               Home Equipment: Cane - single point   Additional Comments: getting bathroom  remodeled to be handicap accessible including walk in shower with seat and grab bars, but has not yet taken place.      Prior Functioning/Environment Prior Level of Function : Independent/Modified Independent;Driving             Mobility Comments: no AD for fxl mobility at baseline, drives and works out at the [employer] ADLs Comments: INDEP for all I/ADLs        OT Problem List: Decreased activity tolerance;Impaired balance (sitting and/or standing);Cardiopulmonary status limiting activity      OT Treatment/Interventions: Self-care/ADL training;Therapeutic exercise;Therapeutic activities;Balance training;Patient/family education;DME and/or AE instruction    OT Goals(Current goals can be found in the care plan section) Acute Rehab OT Goals Patient Stated Goal: to go home OT Goal Formulation: With patient Time For Goal Achievement: 03/15/21 Potential to Achieve Goals: Good ADL Goals Pt Will Perform Lower Body Dressing: with modified independence;sit to/from stand  Pt Will Transfer to Toilet: with modified independence;ambulating Pt Will Perform Toileting - Clothing Manipulation and hygiene: with modified independence;sit to/from stand  OT Frequency: Min 2X/week    Co-evaluation              AM-PAC OT "6 Clicks" Daily Activity     Outcome Measure Help from another person eating meals?: None Help from another person taking care of personal grooming?: A Little Help from another person toileting, which includes using toliet, bedpan, or urinal?: A Little Help from another person bathing (including washing, rinsing, drying)?: A Little Help from another person to put on and taking off regular upper body clothing?: None Help from another person to put on and taking off regular lower body clothing?: A Little 6 Click Score: 20   End of Session Equipment Utilized During Treatment: Gait belt;Rolling walker (2 wheels) Nurse Communication: Mobility status  Activity Tolerance: Patient  tolerated treatment well Patient left: in bed;with call bell/phone within reach;with bed alarm set;with family/visitor present  OT Visit Diagnosis: Unsteadiness on feet (R26.81);Other abnormalities of gait and mobility (R26.89)                Time: 0375-4360 OT Time Calculation (min): 35 min Charges:  OT General Charges $OT Visit: 1 Visit OT Evaluation $OT Eval Moderate Complexity: 1 Mod OT Treatments $Self Care/Home Management : 8-22 mins $Therapeutic Activity: 8-22 mins  Gerrianne Scale, MS, OTR/L ascom [phone] 03/01/21, 4:57 PM

## 2021-03-01 NOTE — Progress Notes (Signed)
Urology Consult Follow Up  Subjective: Patient and wife are very happy this morning as he was able to have a good night sleep.  VSS afebrile  Serum creatinine 1.41 which is baseline.  WC count 6.4.  Urine output is good with clear yellow urine  Anti-infectives: Anti-infectives (From admission, onward)    Start     Dose/Rate Route Frequency Ordered Stop   02/25/21 1700  cefTRIAXone (ROCEPHIN) 1 g in sodium chloride 0.9 % 100 mL IVPB  Status:  Discontinued        1 g 200 mL/hr over 30 Minutes Intravenous Every 24 hours 02/25/21 1611 02/27/21 1543   02/25/21 1415  ciprofloxacin (CIPRO) IVPB 400 mg  Status:  Discontinued        400 mg 200 mL/hr over 60 Minutes Intravenous  Once 02/25/21 1408 02/25/21 1424       Current Facility-Administered Medications  Medication Dose Route Frequency Provider Last Rate Last Admin   0.9 %  sodium chloride infusion  250 mL Intravenous PRN Chotiner, Yevonne Aline, MD 10 mL/hr at 02/25/21 1732 250 mL at 02/25/21 1732   acetaminophen (TYLENOL) tablet 650 mg  650 mg Oral Q6H PRN Loletha Grayer, MD   650 mg at 02/28/21 2025   Or   acetaminophen (TYLENOL) suppository 650 mg  650 mg Rectal Q6H PRN Loletha Grayer, MD       clopidogrel (PLAVIX) tablet 75 mg  75 mg Oral Daily Chotiner, Yevonne Aline, MD   75 mg at 02/28/21 0848   diazepam (VALIUM) injection 2.5 mg  2.5 mg Intravenous Q4H PRN Hollice Espy, MD       docusate sodium (COLACE) capsule 100 mg  100 mg Oral Daily Chotiner, Yevonne Aline, MD   100 mg at 02/28/21 0847   doxazosin (CARDURA) tablet 4 mg  4 mg Oral Daily Chotiner, Yevonne Aline, MD   4 mg at 02/28/21 0848   enoxaparin (LOVENOX) injection 40 mg  40 mg Subcutaneous Q24H Chotiner, Yevonne Aline, MD   40 mg at 02/28/21 2018   famotidine (PEPCID) tablet 20 mg  20 mg Oral QHS Chotiner, Yevonne Aline, MD   20 mg at 02/28/21 2017   finasteride (PROSCAR) tablet 5 mg  5 mg Oral Daily Chotiner, Yevonne Aline, MD   5 mg at 02/28/21 0847   furosemide (LASIX) tablet 20 mg  20  mg Oral QODAY BeersShanon Brow, RPH   20 mg at 02/28/21 0848   HYDROcodone-acetaminophen (NORCO/VICODIN) 5-325 MG per tablet 1 tablet  1 tablet Oral Q6H PRN Loletha Grayer, MD   1 tablet at 03/01/21 7622   isosorbide mononitrate (IMDUR) 24 hr tablet 30 mg  30 mg Oral Daily Chotiner, Yevonne Aline, MD   30 mg at 02/28/21 0847   losartan (COZAAR) tablet 50 mg  50 mg Oral Daily Chotiner, Yevonne Aline, MD   50 mg at 02/28/21 0848   melatonin tablet 5 mg  5 mg Oral QHS PRN Chotiner, Yevonne Aline, MD   5 mg at 02/28/21 2228   metoprolol tartrate (LOPRESSOR) tablet 12.5 mg  12.5 mg Oral BID Loletha Grayer, MD   12.5 mg at 02/28/21 2018   mirabegron ER (MYRBETRIQ) tablet 50 mg  50 mg Oral Daily Hollice Espy, MD       morphine (PF) 2 MG/ML injection 1 mg  1 mg Intravenous Q3H PRN Wyvonnia Dusky, MD   1 mg at 02/28/21 2228   ondansetron (ZOFRAN) tablet 4 mg  4 mg Oral Q6H PRN Chotiner,  Yevonne Aline, MD       Or   ondansetron [facility]) injection 4 mg  4 mg Intravenous Q6H PRN Chotiner, Yevonne Aline, MD       polyethylene glycol (MIRALAX / GLYCOLAX) packet 17 g  17 g Oral Daily PRN Chotiner, Yevonne Aline, MD   17 g at 02/28/21 2032   primidone (MYSOLINE) tablet 100 mg  100 mg Oral TID Chotiner, Yevonne Aline, MD   100 mg at 02/28/21 2018   rosuvastatin (CRESTOR) tablet 5 mg  5 mg Oral Daily Chotiner, Yevonne Aline, MD   5 mg at 02/28/21 0848   sodium chloride flush (NS) 0.9 % injection 3 mL  3 mL Intravenous Q12H Chotiner, Yevonne Aline, MD   3 mL at 02/28/21 2020   sodium chloride flush (NS) 0.9 % injection 3 mL  3 mL Intravenous PRN Chotiner, Yevonne Aline, MD       trospium [facility]) tablet 20 mg  20 mg Oral BID Hollice Espy, MD   20 mg at 02/28/21 2020     Objective: Vital signs in last 24 hours: Temp:  [98 F (36.7 C)-98.5 F (36.9 C)] 98.3 F (36.8 C) (02/09 0309) Pulse Rate:  [55-62] 55 (02/09 0309) Resp:  [15-18] 15 (02/09 0309) BP: (109-135)/(51-67) 118/58 (02/09 0309) SpO2:  [96 %-99 %] 96 % (02/09  0309)  Intake/Output from previous day: 02/08 0701 - 02/09 0700 In: -  Out: 2500 [Urine:2500] Intake/Output this shift: No intake/output data recorded.   Physical Exam Constitutional:  Well nourished. Alert and oriented, No acute distress. HEENT: [place] AT, moist mucus membranes.  Trachea midline Cardiovascular: No clubbing, cyanosis, or edema. Respiratory: Normal respiratory effort, no increased work of breathing. GU: No CVA tenderness.  No bladder fullness or masses.  Suprapubic tube is in place, dressings are clean and dry and it is draining clear yellow urine . Neurologic: Grossly intact, no focal deficits, moving all 4 extremities. Psychiatric: Normal mood and affect.   Lab Results:  Recent Labs    02/28/21 0317 03/01/21 0349  WBC  --  6.4  HGB 10.7* 10.2*  HCT  --  29.6*  PLT  --  212   BMET Recent Labs    02/28/21 0317 03/01/21 0349  NA 128* 130*  K 4.6 5.2*  CL 98 98  CO2 23 26  GLUCOSE 119* 96  BUN 41* 37*  CREATININE 1.53* 1.41*  CALCIUM 8.6* 8.5*   PT/INR No results for input(s): LABPROT, INR in the last 72 hours. ABG No results for input(s): PHART, HCO3 in the last 72 hours.  Invalid input(s): PCO2, PO2  Studies/Results: DG Abd 2 Views  Result Date: 02/27/2021 CLINICAL DATA:  Lower abdominal pain EXAM: ABDOMEN - 2 VIEW COMPARISON:  CT done on 02/25/2021 FINDINGS: Bowel gas pattern is nonspecific. Moderate amount of stool is seen in the colon and rectum. Suprapubic cystostomy catheter is seen. There is breathing motion in the upright radiograph limiting evaluation for pneumoperitoneum. As far as seen, there is no definite demonstrable pneumoperitoneum. IMPRESSION: Nonspecific bowel gas pattern. Electronically Signed   By: Elmer Picker M.D.   On: 02/27/2021 10:22     Assessment: 88 year old male who presented to the ED for lower abdominal pain who was found to be hyponatremic secondary to increasing his water intake to 64 fluid ounces daily by his  primary care provider who continued to have severe bladder spasms associated with the suprapubic tube.  He was seen yesterday evening by Dr. Erlene Quan and it was  suggested to increase his Myrbetriq to 50 mg daily, add Sanctura 20 mg twice daily and to have IV Valium overnight for extreme bladder spasms.  Plan: -Patient and wife are quite pleased that pain was able to be controlled last night and he was able to sleep through the night -If the latter pain continues to be under control with the Myrbetriq 50 mg daily and the Sanctura 20 mg twice daily during the day, they would like to be discharged and follow-up as originally planned on March 1 for a routine suprapubic tube exchange -If the pain returns today and cannot be controlled, we will need to expedite the SPT exchange over a wire in our office next week     LOS: 4 days    [facility] 03/01/2021

## 2021-03-01 NOTE — Progress Notes (Signed)
PT Cancellation Note  Patient Details Name: Shawn Andrews MRN: [id number] DOB: 01-22-1928   Cancelled Treatment:    Reason Eval/Treat Not Completed: Pain limiting ability to participate;Other (comment) (patient getting bolus with blood pressures running on the lower side). He also reports pain worsening around the penis area this afternoon and wants pain medication.  Patient requesting to hold on PT at this time. Will continue with attempts as patient able to participate.   Minna Merritts, PT, MPT  Percell Locus 03/01/2021, 1:23 PM

## 2021-03-01 NOTE — Progress Notes (Signed)
IR progress note   Patient seen bedside for pain related to SPT placed Jan 31. CT shows correct anatomical placement, pain may be due to irritation to bladder trigone/prostate. SPT retracted 2 cm bedside, and re-secured. IR will follow in the AM.   Albin Felling, MD  Vascular and Interventional Radiology 03/01/2021 3:59 PM

## 2021-03-01 NOTE — Progress Notes (Signed)
PROGRESS NOTE    Shawn Andrews  WCB:[id number] DOB: 10/01/1928 DOA: 02/25/2021 PCP: Leonel Ramsay, MD    Assessment & Plan:   Principal Problem:   Hyponatremia Active Problems:   Essential hypertension   Benign prostatic hyperplasia   Essential tremor   CKD (chronic kidney disease) stage 3, GFR 30-59 ml/min (HCC)   CAD (coronary artery disease)   HFrEF (heart failure with reduced ejection fraction) (HCC)   UTI (urinary tract infection)   Suprapubic catheter (HCC)   Lower abdominal pain   Generalized abdominal pain   Hyponatremia: trending up from day prior. Continue w/ fluid restriction   Lower abdominal pain: w/ penis pain. Likely secondary bladder spasms. Improved penis pain overnight. Continue on norco, myrbetriq, & trospium as per urology. May need to go for suprapubic cath exchange if pain returns as per urology     Hyperkalemia: lokelma x 1. Hold home dose of losartan today  Constipation: continue on colace, miralax. Started on dulcolax    UTI: w/ suprapubic catheter.  Likely colonization.  Discontinued antibiotics with multiple species seen on urine culture.  UTI ruled out.   Chronic systolic CHF: continue on metoprolol, losartan, lasix. Monitor I/Os. Amlodipine was d/c as BP is on lower end of normal   CKDIIIa: Cr is labile. Avoid nephrotoxic meds    BPH: continue on home dose of cardura, finasteride    HTN: continue on losartan & reduced dose of metoprolol. Hold for MAP < 65 and/or HR <65   DVT prophylaxis: lovenox  Code Status: full  Family Communication: discussed pt's care w/ pt's family at bedside and answered their questions  Disposition Plan: likely d/c back home   Level of care: Med-Surg  Status is: Inpatient Remains inpatient appropriate because: severity of illness, orthostatic w/ therapy today     Consultants:  Urology   Procedures:  Antimicrobials:   Subjective: Pt c/o feeling like he had to pass out when he worked with therapy  today.   Objective: Vitals:   02/28/21 1945 02/28/21 2318 03/01/21 0309 03/01/21 0757  BP: (!) 124/59 109/67 (!) 118/58 135/64  Pulse: 62 62 (!) 55 (!) 59  Resp: 15 15 15 16   Temp: 98.5 F (36.9 C) 98 F (36.7 C) 98.3 F (36.8 C) 97.8 F (36.6 C)  TempSrc:      SpO2: 97% 98% 96% 93%  Weight:      Height:        Intake/Output Summary (Last 24 hours) at 03/01/2021 6160 Last data filed at 03/01/2021 7371 Gross per 24 hour  Intake --  Output 2500 ml  Net -2500 ml   Filed Weights   02/25/21 1126  Weight: 78 kg    Examination:  General exam: Appears uncomfortable but calm Respiratory system: clear breath sounds b/l  Cardiovascular system: S1/S2+. No rubs or clicks   Gastrointestinal system: Abd is soft, NT, ND & hypoactive bowel sounds  Central nervous system: Alert and oriented. Moves all extremities   Psychiatry: Judgement and insight appears normal. Flat mood and affect    Data Reviewed: I have personally reviewed following labs and imaging studies  CBC: Recent Labs  Lab 02/25/21 1215 02/27/21 0354 02/28/21 0317 03/01/21 0349  WBC 6.3  --   --  6.4  HGB 10.9* 11.6* 10.7* 10.2*  HCT 30.6*  --   --  29.6*  MCV 82.3  --   --  82.5  PLT 198  --   --  062   Basic Metabolic  Panel: Recent Labs  Lab 02/25/21 1215 02/26/21 0608 02/27/21 0354 02/28/21 0317 03/01/21 0349  NA 123* 126* 128* 128* 130*  K 4.9 4.6 4.9 4.6 5.2*  CL 94* 97* 100 98 98  CO2 23 26 23 23 26   GLUCOSE 167* 99 115* 119* 96  BUN 25* 28* 34* 41* 37*  CREATININE 1.29* 1.37* 1.49* 1.53* 1.41*  CALCIUM 8.7* 8.7* 9.0 8.6* 8.5*   GFR: Estimated Creatinine Clearance: 36 mL/min (A) (by C-G formula based on SCr of 1.41 mg/dL (H)). Liver Function Tests: Recent Labs  Lab 02/25/21 1215  AST 23  ALT 12  ALKPHOS 56  BILITOT 0.9  PROT 5.6*  ALBUMIN 3.3*   No results for input(s): LIPASE, AMYLASE in the last 168 hours. No results for input(s): AMMONIA in the last 168 hours. Coagulation  Profile: No results for input(s): INR, PROTIME in the last 168 hours. Cardiac Enzymes: No results for input(s): CKTOTAL, CKMB, CKMBINDEX, TROPONINI in the last 168 hours. BNP (last 3 results) No results for input(s): PROBNP in the last 8760 hours. HbA1C: No results for input(s): HGBA1C in the last 72 hours. CBG: No results for input(s): GLUCAP in the last 168 hours. Lipid Profile: No results for input(s): CHOL, HDL, LDLCALC, TRIG, CHOLHDL, LDLDIRECT in the last 72 hours. Thyroid Function Tests: No results for input(s): TSH, T4TOTAL, FREET4, T3FREE, THYROIDAB in the last 72 hours. Anemia Panel: No results for input(s): VITAMINB12, FOLATE, FERRITIN, TIBC, IRON, RETICCTPCT in the last 72 hours. Sepsis Labs: No results for input(s): PROCALCITON, LATICACIDVEN in the last 168 hours.  Recent Results (from the past 240 hour(s))  Urine Culture     Status: Abnormal   Collection Time: 02/25/21 11:56 AM   Specimen: Urine, Suprapubic  Result Value Ref Range Status   Specimen Description   Final    URINE, SUPRAPUBIC Performed at [facility], [address], [place], [place] [address]    Special Requests   Final    NONE Performed at [facility], [address] [place], [place] [address]    Culture MULTIPLE SPECIES PRESENT, SUGGEST RECOLLECTION (A)  Final   Report Status 02/27/2021 FINAL  Final  Resp Panel by RT-PCR (Flu A&B, Covid) Nasopharyngeal Swab     Status: None   Collection Time: 02/25/21  2:30 PM   Specimen: Nasopharyngeal Swab; Nasopharyngeal(NP) swabs in vial transport medium  Result Value Ref Range Status   SARS Coronavirus 2 by RT PCR NEGATIVE NEGATIVE Final    Comment: (NOTE) SARS-CoV-2 target nucleic acids are NOT DETECTED.  The SARS-CoV-2 RNA is generally detectable in upper respiratory specimens during the acute phase of infection. The lowest concentration of SARS-CoV-2 viral copies this assay can detect is 138 copies/mL. A negative result does not  preclude SARS-Cov-2 infection and should not be used as the sole basis for treatment or other patient management decisions. A negative result may occur with  improper specimen collection/handling, submission of specimen other than nasopharyngeal swab, presence of viral mutation(s) within the areas targeted by this assay, and inadequate number of viral copies(<138 copies/mL). A negative result must be combined with clinical observations, patient history, and epidemiological information. The expected result is Negative.  Fact Sheet for Patients:  EntrepreneurPulse.com.au  Fact Sheet for Healthcare Providers:  IncredibleEmployment.be  This test is no t yet approved or cleared by the [place] FDA and  has been authorized for detection and/or diagnosis of SARS-CoV-2 by FDA under an Emergency Use Authorization (EUA). This EUA will remain  in effect (meaning this test can be used) for the duration of the COVID-19 declaration under Section 564(b)(1) of the Act, 21 U.S.C.section 360bbb-3(b)(1), unless the authorization is terminated  or revoked sooner.       Influenza A by PCR NEGATIVE NEGATIVE Final   Influenza B by PCR NEGATIVE NEGATIVE Final    Comment: (NOTE) The Xpert Xpress SARS-CoV-2/FLU/RSV plus assay is intended as an aid in the diagnosis of influenza from Nasopharyngeal swab specimens and should not be used as a sole basis for treatment. Nasal washings and aspirates are unacceptable for Xpert Xpress SARS-CoV-2/FLU/RSV testing.  Fact Sheet for Patients: EntrepreneurPulse.com.au  Fact Sheet for Healthcare Providers: IncredibleEmployment.be  This test is not yet approved or cleared by the [place] FDA and has been authorized for detection and/or diagnosis of SARS-CoV-2 by FDA under an Emergency Use Authorization (EUA). This EUA will remain in effect (meaning this test can be used) for the  duration of the COVID-19 declaration under Section 564(b)(1) of the Act, 21 U.S.C. section 360bbb-3(b)(1), unless the authorization is terminated or revoked.  Performed at [facility], [address] [place],  [address]          Radiology Studies: DG Abd 2 Views  Result Date: 02/27/2021 CLINICAL DATA:  Lower abdominal pain EXAM: ABDOMEN - 2 VIEW COMPARISON:  CT done on 02/25/2021 FINDINGS: Bowel gas pattern is nonspecific. Moderate amount of stool is seen in the colon and rectum. Suprapubic cystostomy catheter is seen. There is breathing motion in the upright radiograph limiting evaluation for pneumoperitoneum. As far as seen, there is no definite demonstrable pneumoperitoneum. IMPRESSION: Nonspecific bowel gas pattern. Electronically Signed   By: Elmer Picker M.D.   On: 02/27/2021 10:22        Scheduled Meds:  clopidogrel  75 mg Oral Daily   docusate sodium  100 mg Oral Daily   doxazosin  4 mg Oral Daily   enoxaparin (LOVENOX) injection  40 mg Subcutaneous Q24H   famotidine  20 mg Oral QHS   finasteride  5 mg Oral Daily   furosemide  20 mg Oral QODAY   isosorbide mononitrate  30 mg Oral Daily   losartan  50 mg Oral Daily   metoprolol tartrate  12.5 mg Oral BID   mirabegron ER  50 mg Oral Daily   primidone  100 mg Oral TID   rosuvastatin  5 mg Oral Daily   sodium chloride flush  3 mL Intravenous Q12H   trospium  20 mg Oral BID   Continuous Infusions:  sodium chloride 250 mL (02/25/21 1732)     LOS: 4 days    Time spent: 33 mins     Wyvonnia Dusky, MD [facility] Pager [phone]  If 7PM-7AM, please contact night-coverage 03/01/2021, 8:32 AM

## 2021-03-01 NOTE — Progress Notes (Signed)
Mrs. Welles called the office this afternoon stating that her husband was in excruciating pain.  The nurse gave IV valium, Vicodin and he had Myrbetriq 50 mg and Sanctura 20 mg on board.  They then gave IV morphine to no avail.    I spoke with Dr. Denna Haggard regarding exchanging the pigtail for a council tip catheter.  As the SPT tract was new, it would be risky to exchange at this time and we would risk losing the tract.  He then suggested to retract the SPT at bedside and it seems to have relieved the pain at this time.    I have made him NPO tonight in case we need to intervene further.  If the pain continues, it may be best to consider placing an urethral catheter and removing the SPT.  The family would like to keep the SPT if possible.

## 2021-03-02 ENCOUNTER — Inpatient Hospital Stay: Payer: Medicare Other | Admitting: Radiology

## 2021-03-02 DIAGNOSIS — Z9359 Other cystostomy status: Secondary | ICD-10-CM

## 2021-03-02 DIAGNOSIS — R3989 Other symptoms and signs involving the genitourinary system: Secondary | ICD-10-CM

## 2021-03-02 HISTORY — PX: IR CATHETER TUBE CHANGE: IMG717

## 2021-03-02 LAB — CBC
HCT: 31.5 % — ABNORMAL LOW (ref 39.0–52.0)
Hemoglobin: 10.7 g/dL — ABNORMAL LOW (ref 13.0–17.0)
MCH: 28.1 pg (ref 26.0–34.0)
MCHC: 34 g/dL (ref 30.0–36.0)
MCV: 82.7 fL (ref 80.0–100.0)
Platelets: 192 10*3/uL (ref 150–400)
RBC: 3.81 MIL/uL — ABNORMAL LOW (ref 4.22–5.81)
RDW: 14.2 % (ref 11.5–15.5)
WBC: 6.8 10*3/uL (ref 4.0–10.5)
nRBC: 0 % (ref 0.0–0.2)

## 2021-03-02 LAB — BASIC METABOLIC PANEL
Anion gap: 4 — ABNORMAL LOW (ref 5–15)
BUN: 32 mg/dL — ABNORMAL HIGH (ref 8–23)
CO2: 26 mmol/L (ref 22–32)
Calcium: 8.6 mg/dL — ABNORMAL LOW (ref 8.9–10.3)
Chloride: 102 mmol/L (ref 98–111)
Creatinine, Ser: 1.39 mg/dL — ABNORMAL HIGH (ref 0.61–1.24)
GFR, Estimated: 48 mL/min — ABNORMAL LOW (ref >60)
Glucose, Bld: 102 mg/dL — ABNORMAL HIGH (ref 70–99)
Potassium: 5.2 mmol/L — ABNORMAL HIGH (ref 3.5–5.1)
Sodium: 132 mmol/L — ABNORMAL LOW (ref 135–145)

## 2021-03-02 MED ORDER — LIDOCAINE 1% INJECTION FOR CIRCUMCISION
INJECTION | INTRAVENOUS | Status: DC | PRN
  Administered 2021-03-02: 8 mL via SUBCUTANEOUS

## 2021-03-02 MED ORDER — LACTULOSE 10 GM/15ML PO SOLN
30.0000 g | Freq: Two times a day (BID) | ORAL | Status: DC
  Administered 2021-03-02 (×2): 30 g via ORAL
  Filled 2021-03-02 (×3): qty 60

## 2021-03-02 MED ORDER — MAGNESIUM HYDROXIDE 400 MG/5ML PO SUSP: 30.0000 mL | Freq: Every day | ORAL | Status: DC

## 2021-03-02 MED ORDER — SODIUM CHLORIDE 0.9 % IV SOLN
INTRAVENOUS | Status: DC | PRN
  Administered 2021-03-02: 10 mL/h via INTRAVENOUS

## 2021-03-02 MED ORDER — IOHEXOL 350 MG/ML SOLN
10.0000 mL | Freq: Once | INTRAVENOUS | Status: AC | PRN
  Administered 2021-03-02: 10 mL

## 2021-03-02 NOTE — Progress Notes (Signed)
03/02/2021  Patient was given IV Valium and IV morphine over night for bladder spasms.  He does state that he slept well.  Unfortunately, his bladder pain returned after working with OT and PT this morning.  We reviewed the options with patient and his wife regarding removal of the SPT to and reverting back to the Foley catheter or attempt exchanged the rigid 16 French pigtail catheter to a softer 84 [place] council tip catheter to see if it is more tolerable.  They would like to see if the exchange over the wire to the council tip catheter could be scheduled for today and proceed with this option, but they also stated that if it was either unsuccessful in the exchange or the new consult tip catheter was also creating intolerable bladder spasms they would revert back to the Foley catheter.  He has been n.p.o. since midnight.  Dr. Denna Haggard will attempt exchange over the wire today in IR.  I spent 25 minutes on the day of the encounter to include pre-visit record review, face-to-face time with the patient, and post-visit ordering of tests.

## 2021-03-02 NOTE — Progress Notes (Signed)
Referring Physician(s): Bryna Colander  Supervising Physician: Juliet Rude  Patient Status:  [facility] - In-pt  Chief Complaint:  Bladder pain secondary to SP tube  Brief History:  Shawn Andrews is a 88 y.o. male with BPH/urinary outlet obstruction and chronic urinary retention.  He was seen by Urology on 1/17 and decision made for suprapubic catheter placement.   He underwent placement on 02/20/21 by Dr. Dwaine Gale.  Ever since that time he has had excruciating pain and spasms.  The SP tube was pulled back by Dr. Denna Haggard yesterday, but this didn't help at all.  He is maxed out on pain medications and is quite sleepy during my visit.    Allergies: Penicillins, Cialis [tadalafil], Lipitor [atorvastatin], and Lisinopril  Medications: Prior to Admission medications   Medication Sig Start Date End Date Taking? Authorizing Provider  acetaminophen (TYLENOL 8 HOUR) 650 MG CR tablet Take 1 tablet (650 mg total) by mouth 2 (two) times a day. 06/24/18  Yes Ria Bush, MD  amLODipine (NORVASC) 2.5 MG tablet Take 1 tablet (2.5 mg total) by mouth daily. 01/20/21  Yes Nita Sells, MD  atorvastatin (LIPITOR) 40 MG tablet Take 40 mg by mouth daily. 02/20/21  Yes [provider]  Cholecalciferol (VITAMIN D3) 25 MCG (1000 UT) CAPS Take 1 capsule (1,000 Units total) by mouth daily. 08/25/18  Yes Ria Bush, MD  clopidogrel (PLAVIX) 75 MG tablet Take 1 tablet by mouth daily. 10/10/20  Yes [provider]  docusate sodium (COLACE) 100 MG capsule Take 1 capsule (100 mg total) by mouth daily. 12/13/20  Yes Ria Bush, MD  doxazosin (CARDURA) 4 MG tablet TAKE 1 TABLET BY MOUTH  DAILY 11/17/19  Yes Ria Bush, MD  famotidine (PEPCID) 20 MG tablet TAKE 1 TABLET BY MOUTH AT  BEDTIME 01/23/21  Yes Ria Bush, MD  finasteride (PROSCAR) 5 MG tablet TAKE 1 TABLET BY MOUTH  DAILY 11/17/19  Yes Ria Bush, MD  furosemide (LASIX) 20 MG tablet Take 1  tablet by mouth every other day. 10/09/20  Yes [provider]  isosorbide mononitrate (IMDUR) 30 MG 24 hr tablet Take 1 tablet by mouth daily. 10/10/20  Yes [provider]  loratadine (CLARITIN) 10 MG tablet Take 1 tablet (10 mg total) by mouth daily as needed for allergies. 12/22/12  Yes Ria Bush, MD  losartan (COZAAR) 50 MG tablet Take 1 tablet (50 mg total) by mouth daily. 12/13/20  Yes Ria Bush, MD  metoprolol tartrate (LOPRESSOR) 50 MG tablet Take 0.5 tablets (25 mg total) by mouth 2 (two) times daily. 10/16/20  Yes Ria Bush, MD  primidone (MYSOLINE) 50 MG tablet Take 2 tablets (100 mg total) by mouth 3 (three) times daily. 10/16/20  Yes Ria Bush, MD  rosuvastatin (CRESTOR) 5 MG tablet Take 5 mg by mouth daily. 12/21/20 12/21/21 Yes [provider]  vitamin B-12 (CYANOCOBALAMIN) 500 MCG tablet Take 1 tablet (500 mcg total) by mouth once a week. 08/30/20  Yes Ria Bush, MD  aspirin (ASPIRIN 81) 81 MG EC tablet Take 1 tablet (81 mg total) by mouth every Monday, Wednesday, and Friday. Patient not taking: Reported on 02/20/2021 08/30/20   Ria Bush, MD  Coenzyme Q10 (CO Q-10) 100 MG CAPS Take 1 capsule by mouth daily. Patient not taking: Reported on 02/25/2021 12/21/20   Ria Bush, MD  nitroGLYCERIN (NITROSTAT) 0.4 MG SL tablet Place under the tongue. 10/09/20   [provider]  polyethylene glycol powder (GLYCOLAX/MIRALAX) 17 GM/SCOOP powder Take 8.5-17  g by mouth daily as needed for moderate constipation. 12/13/20   Ria Bush, MD     Vital Signs: BP (!) 95/52    Pulse 90    Temp 98 F (36.7 C)    Resp 15    Ht 5\' 10"  (1.778 m)    Wt 171 lb 15.3 oz (78 kg)    SpO2 94%    BMI 24.67 kg/m   Physical Exam Constitutional:      Comments: Somnolent  HENT:     Head: Normocephalic and atraumatic.  Cardiovascular:     Rate and Rhythm: Normal rate and regular rhythm.  Pulmonary:     Effort: Pulmonary  effort is normal. No respiratory distress.  Skin:    General: Skin is warm and dry.  Neurological:     General: No focal deficit present.    Imaging: DG Abd 2 Views  Result Date: 02/27/2021 CLINICAL DATA:  Lower abdominal pain EXAM: ABDOMEN - 2 VIEW COMPARISON:  CT done on 02/25/2021 FINDINGS: Bowel gas pattern is nonspecific. Moderate amount of stool is seen in the colon and rectum. Suprapubic cystostomy catheter is seen. There is breathing motion in the upright radiograph limiting evaluation for pneumoperitoneum. As far as seen, there is no definite demonstrable pneumoperitoneum. IMPRESSION: Nonspecific bowel gas pattern. Electronically Signed   By: Elmer Picker M.D.   On: 02/27/2021 10:22    Labs:  CBC: Recent Labs    02/20/21 1017 02/25/21 1215 02/27/21 0354 02/28/21 0317 03/01/21 0349 03/02/21 0649  WBC 7.1 6.3  --   --  6.4 6.8  HGB 11.5* 10.9* 11.6* 10.7* 10.2* 10.7*  HCT 34.0* 30.6*  --   --  29.6* 31.5*  PLT 192 198  --   --  212 192    COAGS: Recent Labs    01/17/21 0419 02/20/21 1017  INR 1.1 1.1  APTT 31  --     BMP: Recent Labs    02/27/21 0354 02/28/21 0317 03/01/21 0349 03/02/21 0649  NA 128* 128* 130* 132*  K 4.9 4.6 5.2* 5.2*  CL 100 98 98 102  CO2 23 23 26 26   GLUCOSE 115* 119* 96 102*  BUN 34* 41* 37* 32*  CALCIUM 9.0 8.6* 8.5* 8.6*  CREATININE 1.49* 1.53* 1.41* 1.39*  GFRNONAA 44* 43* 47* 48*    LIVER FUNCTION TESTS: Recent Labs    08/25/20 0814 11/22/20 1118 12/13/20 0821 01/18/21 0707 01/19/21 0645 02/25/21 1215  BILITOT 0.5  --   --  0.8 0.8 0.9  AST 15  --   --  18 16 23   ALT 11  --   --  13 12 12   ALKPHOS 60  --   --  67 65 56  PROT 6.3  --  6.0* 5.7* 5.5* 5.6*  ALBUMIN 4.2   < > 4.1 3.2* 3.1* 3.3*   < > = values in this interval not displayed.    Assessment and Plan:  BPH/urinary outlet obstruction and chronic urinary retention, s/p SPT on 02/20/21.  Continue spasm and pain.  Will proceed with exchange  today by Dr. Denna Haggard.  Risks and benefits discussed with the patient's wife including bleeding, infection, damage to adjacent structures, urine leakage, continued pain, and tube malfunction.  All  questions were answered, agreeable to proceed. Consent signed and in chart.   Electronically Signed: Murrell Redden, PA-C 03/02/2021, 12:47 PM    I spent a total of 15 Minutes at the the patient's bedside AND on the patient's  hospital floor or unit, greater than 50% of which was counseling/coordinating care for SP tube exchange.

## 2021-03-02 NOTE — Progress Notes (Signed)
Occupational Therapy Treatment Patient Details Name: Shawn Andrews MRN: [id number] DOB: 04-Sep-1928 Today's Date: 03/02/2021   History of present illness Pt is a 88 y/o M with PMH: CAD, HFrEF, BPH and chronic urine retention followed by [facility] (s/p suprapubic catheter placed on 02/20/21). Presented to ED d/t spasmodic lower abdominal pain radiating to the penis. In addition, found to be hyponatremic.  MD assessment includes: UTI, hyperkalemia, and hyponatremia.   OT comments  Pt seen for OT tx this date to f/u re: safety with ADLs/ADL mobility. He is able to perform standing g/h tasks as well as UB bathing sink-side with G balance with SETUP/close SUPV. He requires MIN A for standing posterior LB bathing-thorough completion and CGA to MIN A for LB dressing-to thread catheter through R pant leg. Orthostatics taken again today and much improved. Pt able to perform fxl mobility around nursing unit twice this session with VS monitored by OT throughout. Pt with less knee buckling as well.  Orthostatic  Supine 150/78 (97) Sitting 125/71 (86)  Standing 129/71 (79) Walking after 2 minutes 102/60 (71)  Pt able to safely returned to room and LPN present for med pass. Will continue to follow acutely. Continue to recommend [place] f/u.    Recommendations for follow up therapy are one component of a multi-disciplinary discharge planning process, led by the attending physician.  Recommendations may be updated based on patient status, additional functional criteria and insurance authorization.    Follow Up Recommendations  Home health OT    Assistance Recommended at Discharge    Patient can return home with the following  A little help with bathing/dressing/bathroom;Assistance with cooking/housework;Assist for transportation;Help with stairs or ramp for entrance   Equipment Recommendations  BSC/3in1;Other (comment)    Recommendations for Other Services      Precautions /  Restrictions Precautions Precautions: Fall Restrictions Other Position/Activity Restrictions: significant orthostatic hypotension.       Mobility Bed Mobility Overal bed mobility: Modified Independent             General bed mobility comments: Extra time and effort    Transfers Overall transfer level: Needs assistance Equipment used: None Transfers: Sit to/from Stand Sit to Stand: Supervision           General transfer comment: good control, minimal cues for safe sequence for use of RW     Balance Overall balance assessment: Needs assistance   Sitting balance-Leahy Scale: Normal     Standing balance support: During functional activity, Bilateral upper extremity supported Standing balance-Leahy Scale: Good Standing balance comment: RW for fxl mobility, no AD for static standing tasks.                           ADL either performed or assessed with clinical judgement   ADL Overall ADL's : Needs assistance/impaired     Grooming: Wash/dry hands;Wash/dry face;Oral care;Supervision/safety;Min guard;Standing   Upper Body Bathing: Set up;Supervision/ safety;Standing   Lower Body Bathing: Minimal assistance;Sit to/from stand Lower Body Bathing Details (indicate cue type and reason): MIN A for thorough completion of posterior LB bathing/peri are. Spouse does endorses having to help him thoroughly complete at times. Upper Body Dressing : Set up;Sitting   Lower Body Dressing: Minimal assistance;Sit to/from stand Lower Body Dressing Details (indicate cue type and reason): MIN A to thread catheter through R pant leg             Functional mobility during ADLs: Supervision/safety;Min guard;Rolling  walker (2 wheels) (2 laps around nursing unit)      Extremity/Trunk Assessment              Vision       Perception     Praxis      Cognition Arousal/Alertness: Awake/alert Behavior During Therapy: WFL for tasks assessed/performed Overall  Cognitive Status: Within Functional Limits for tasks assessed                                 General Comments: appropriate, oriented, pleasant        Exercises Other Exercises Other Exercises: OT re-took orthostatic VS with improvement today, RN and MD notified    Shoulder Instructions       General Comments      Pertinent Vitals/ Pain       Pain Assessment Pain Assessment: Faces Pain Score: 6  Faces Pain Scale: Hurts little more Pain Location: penis/pubic area Pain Descriptors / Indicators: Cramping, Spasm Pain Intervention(s): Limited activity within patient's tolerance, Monitored during session  Home Living                                          Prior Functioning/Environment              Frequency  Min 2X/week        Progress Toward Goals  OT Goals(current goals can now be found in the care plan section)  Progress towards OT goals: Progressing toward goals  Acute Rehab OT Goals Patient Stated Goal: go home OT Goal Formulation: With patient Time For Goal Achievement: 03/15/21 Potential to Achieve Goals: Good  Plan Discharge plan remains appropriate    Co-evaluation                 AM-PAC OT "6 Clicks" Daily Activity     Outcome Measure   Help from another person eating meals?: None Help from another person taking care of personal grooming?: A Little Help from another person toileting, which includes using toliet, bedpan, or urinal?: A Little Help from another person bathing (including washing, rinsing, drying)?: A Little Help from another person to put on and taking off regular upper body clothing?: None Help from another person to put on and taking off regular lower body clothing?: A Little 6 Click Score: 20    End of Session Equipment Utilized During Treatment: Gait belt;Rolling walker (2 wheels)  OT Visit Diagnosis: Unsteadiness on feet (R26.81);Other abnormalities of gait and mobility (R26.89)    Activity Tolerance Patient tolerated treatment well   Patient Left Other (comment);with family/visitor present (seated EOB with LPN in room, awaiting PT)   Nurse Communication Mobility status        Time: 0034-9179 OT Time Calculation (min): 39 min  Charges: OT General Charges $OT Visit: 1 Visit OT Treatments $Self Care/Home Management : 23-37 mins $Therapeutic Activity: 8-22 mins  Gerrianne Scale, [place], OTR/L ascom [phone] 03/02/21, 6:03 PM

## 2021-03-02 NOTE — Plan of Care (Signed)

## 2021-03-02 NOTE — Care Management Important Message (Signed)
Important Message  Patient Details  Name: Shawn Andrews MRN: [id number] Date of Birth: 1928/02/06   Medicare Important Message Given:  Yes     Juliann Pulse A Alletta Mattos 03/02/2021, 11:27 AM

## 2021-03-02 NOTE — Progress Notes (Signed)
PROGRESS NOTE    Shawn Andrews  QAS:[id number] DOB: Sep 19, 1928 DOA: 02/25/2021 PCP: Leonel Ramsay, MD    Assessment & Plan:   Principal Problem:   Hyponatremia Active Problems:   Essential hypertension   Benign prostatic hyperplasia   Essential tremor   CKD (chronic kidney disease) stage 3, GFR 30-59 ml/min (HCC)   CAD (coronary artery disease)   HFrEF (heart failure with reduced ejection fraction) (HCC)   UTI (urinary tract infection)   Suprapubic catheter (HCC)   Lower abdominal pain   Generalized abdominal pain   Hyponatremia: continues to trend up daily. Continue w/ fluid restriction   Lower abdominal pain: w/ penis pain. Likely secondary bladder spasms. Still w/ penis pain this morning but is s/p suprapubic cath exchange by IR 03/02/21 this afternoon.  Continue on norco, myrbetriq, & trospium as per urology.  Hyperkalemia: lokelma x 1 again today. Continue to hold ACE-I   Constipation: continue on colace, miralax and started on lactulose    UTI: w/ suprapubic catheter.  Likely colonization.  Discontinued antibiotics with multiple species seen on urine culture.  UTI ruled out.   Chronic systolic CHF: continue on metoprolol, lasix. Hold losartan. Monitor I/Os  CKDIIIa: Cr is trending down today. Avoid nephrotoxic meds    BPH: continue on home dose of finasteride, cardura     HTN: continue on reduced dose of metoprolol. Continue to hold losartan. Hold for MAP < 65 and/or HR < 65   DVT prophylaxis: lovenox  Code Status: full  Family Communication: discussed pt's care w/ pt's family at bedside and answered their questions  Disposition Plan: likely d/c back home   Level of care: Med-Surg  Status is: Inpatient Remains inpatient appropriate because: severity of illness, orthostatic w/ therapy today     Consultants:  Urology   Procedures:  Antimicrobials:   Subjective: Pt c/o penis pain this morning   Objective: Vitals:   03/02/21 1330 03/02/21  1335 03/02/21 1340 03/02/21 1401  BP: 140/67 (!) 150/97 (!) 144/69 130/65  Pulse: 60 97 75 83  Resp: 16 (!) 23 14   Temp:    97.6 F (36.4 C)  TempSrc:      SpO2: 97% 96% 98% 100%  Weight:      Height:        Intake/Output Summary (Last 24 hours) at 03/02/2021 1535 Last data filed at 03/02/2021 1117 Gross per 24 hour  Intake --  Output 3300 ml  Net -3300 ml   Filed Weights   02/25/21 1126  Weight: 78 kg    Examination:  General exam: Appears calm but slightly uncomfortable  Respiratory system: clear breath sounds b/l  Cardiovascular system: S1 & S2+. No rubs or clicks   Gastrointestinal system: Abd is soft, NT, ND & hypoactive bowel sounds  Central nervous system: alert and oriented. Moves all extremities   Psychiatry: Judgement and insight appears normal. Flat mood and affect    Data Reviewed: I have personally reviewed following labs and imaging studies  CBC: Recent Labs  Lab 02/25/21 1215 02/27/21 0354 02/28/21 0317 03/01/21 0349 03/02/21 0649  WBC 6.3  --   --  6.4 6.8  HGB 10.9* 11.6* 10.7* 10.2* 10.7*  HCT 30.6*  --   --  29.6* 31.5*  MCV 82.3  --   --  82.5 82.7  PLT 198  --   --  212 798   Basic Metabolic Panel: Recent Labs  Lab 02/26/21 0608 02/27/21 0354 02/28/21 0317 03/01/21 0349 03/02/21  0649  NA 126* 128* 128* 130* 132*  K 4.6 4.9 4.6 5.2* 5.2*  CL 97* 100 98 98 102  CO2 26 23 23 26 26   GLUCOSE 99 115* 119* 96 102*  BUN 28* 34* 41* 37* 32*  CREATININE 1.37* 1.49* 1.53* 1.41* 1.39*  CALCIUM 8.7* 9.0 8.6* 8.5* 8.6*   GFR: Estimated Creatinine Clearance: 36.5 mL/min (A) (by C-G formula based on SCr of 1.39 mg/dL (H)). Liver Function Tests: Recent Labs  Lab 02/25/21 1215  AST 23  ALT 12  ALKPHOS 56  BILITOT 0.9  PROT 5.6*  ALBUMIN 3.3*   No results for input(s): LIPASE, AMYLASE in the last 168 hours. No results for input(s): AMMONIA in the last 168 hours. Coagulation Profile: No results for input(s): INR, PROTIME in the last  168 hours. Cardiac Enzymes: No results for input(s): CKTOTAL, CKMB, CKMBINDEX, TROPONINI in the last 168 hours. BNP (last 3 results) No results for input(s): PROBNP in the last 8760 hours. HbA1C: No results for input(s): HGBA1C in the last 72 hours. CBG: No results for input(s): GLUCAP in the last 168 hours. Lipid Profile: No results for input(s): CHOL, HDL, LDLCALC, TRIG, CHOLHDL, LDLDIRECT in the last 72 hours. Thyroid Function Tests: No results for input(s): TSH, T4TOTAL, FREET4, T3FREE, THYROIDAB in the last 72 hours. Anemia Panel: No results for input(s): VITAMINB12, FOLATE, FERRITIN, TIBC, IRON, RETICCTPCT in the last 72 hours. Sepsis Labs: No results for input(s): PROCALCITON, LATICACIDVEN in the last 168 hours.  Recent Results (from the past 240 hour(s))  Urine Culture     Status: Abnormal   Collection Time: 02/25/21 11:56 AM   Specimen: Urine, Suprapubic  Result Value Ref Range Status   Specimen Description   Final    URINE, SUPRAPUBIC Performed at [facility], [address], [place], [place] [address]    Special Requests   Final    NONE Performed at [facility], [address] [place], [place] [address]    Culture MULTIPLE SPECIES PRESENT, SUGGEST RECOLLECTION (A)  Final   Report Status 02/27/2021 FINAL  Final  Resp Panel by RT-PCR (Flu A&B, Covid) Nasopharyngeal Swab     Status: None   Collection Time: 02/25/21  2:30 PM   Specimen: Nasopharyngeal Swab; Nasopharyngeal(NP) swabs in vial transport medium  Result Value Ref Range Status   SARS Coronavirus 2 by RT PCR NEGATIVE NEGATIVE Final    Comment: (NOTE) SARS-CoV-2 target nucleic acids are NOT DETECTED.  The SARS-CoV-2 RNA is generally detectable in upper respiratory specimens during the acute phase of infection. The lowest concentration of SARS-CoV-2 viral copies this assay can detect is 138 copies/mL. A negative result does not preclude SARS-Cov-2 infection and should not be used as  the sole basis for treatment or other patient management decisions. A negative result may occur with  improper specimen collection/handling, submission of specimen other than nasopharyngeal swab, presence of viral mutation(s) within the areas targeted by this assay, and inadequate number of viral copies(<138 copies/mL). A negative result must be combined with clinical observations, patient history, and epidemiological information. The expected result is Negative.  Fact Sheet for Patients:  EntrepreneurPulse.com.au  Fact Sheet for Healthcare Providers:  IncredibleEmployment.be  This test is no t yet approved or cleared by the [place] FDA and  has been authorized for detection and/or diagnosis of SARS-CoV-2 by FDA under an Emergency Use Authorization (EUA). This EUA will remain  in effect (meaning this test can be used) for the duration of the COVID-19  declaration under Section 564(b)(1) of the Act, 21 U.S.C.section 360bbb-3(b)(1), unless the authorization is terminated  or revoked sooner.       Influenza A by PCR NEGATIVE NEGATIVE Final   Influenza B by PCR NEGATIVE NEGATIVE Final    Comment: (NOTE) The Xpert Xpress SARS-CoV-2/FLU/RSV plus assay is intended as an aid in the diagnosis of influenza from Nasopharyngeal swab specimens and should not be used as a sole basis for treatment. Nasal washings and aspirates are unacceptable for Xpert Xpress SARS-CoV-2/FLU/RSV testing.  Fact Sheet for Patients: EntrepreneurPulse.com.au  Fact Sheet for Healthcare Providers: IncredibleEmployment.be  This test is not yet approved or cleared by the [place] FDA and has been authorized for detection and/or diagnosis of SARS-CoV-2 by FDA under an Emergency Use Authorization (EUA). This EUA will remain in effect (meaning this test can be used) for the duration of the COVID-19 declaration under Section 564(b)(1) of  the Act, 21 U.S.C. section 360bbb-3(b)(1), unless the authorization is terminated or revoked.  Performed at [facility], [address] [place], [place] [address]          Radiology Studies: IR Catheter Tube Change  Result Date: 03/02/2021 INDICATION: Continued irritation from suprapubic catheter, refractory to conservative therapies EXAM: Exchange and conversion of suprapubic catheter using fluoroscopic guidance COMPARISON:  None. MEDICATIONS: Per EMR ANESTHESIA/SEDATION: Local analgesia CONTRAST:  10 mL-administered into the collecting system(s) FLUOROSCOPY: Radiation Exposure Index (as provided by the fluoroscopic device): 0.8 minutes (5 mGy) COMPLICATIONS: None immediate. PROCEDURE: Informed written consent was obtained from the patient after a thorough discussion of the procedural risks, benefits and alternatives. All questions were addressed. Maximal Sterile Barrier Technique was utilized including caps, mask, sterile gowns, sterile gloves, sterile drape, hand hygiene and skin antiseptic. A timeout was performed prior to the initiation of the procedure. The patient was placed supine on the exam table. The lower abdomen was prepped and draped in the standard sterile fashion with inclusion of the existing suprapubic catheter within the sterile field. Injection of the existing suprapubic catheter demonstrated appropriate location within the urinary bladder. The locking loop was released, and over an Amplatz wire, the existing catheter was removed. Over this wire, a new 75 French balloon Foley catheter was advanced into the bladder lumen. The retention balloon was inflated with 10 mL of dilute contrast material and withdrawn to the anterior urinary bladder wall. Injection of the new Foley catheter demonstrated appropriate location within the urinary bladder. It was secured to the skin using silk suture. A clean dressing was placed. It was attached to bag drainage. The patient tolerated  the procedure well without immediate complication. IMPRESSION: Successful conversion of existing suprapubic catheter to a 16 French balloon Foley catheter. Electronically Signed   By: Albin Felling M.D.   On: 03/02/2021 14:26        Scheduled Meds:  bisacodyl  10 mg Oral Daily   clopidogrel  75 mg Oral Daily   docusate sodium  100 mg Oral Daily   doxazosin  4 mg Oral Daily   enoxaparin (LOVENOX) injection  40 mg Subcutaneous Q24H   famotidine  20 mg Oral QHS   finasteride  5 mg Oral Daily   furosemide  20 mg Oral QODAY   isosorbide mononitrate  30 mg Oral Daily   lactulose  30 g Oral BID   metoprolol tartrate  12.5 mg Oral BID   mirabegron ER  50 mg Oral Daily   primidone  100 mg Oral TID   rosuvastatin  5 mg Oral Daily   sodium chloride flush  3 mL Intravenous Q12H   trospium  20 mg Oral BID   Continuous Infusions:  sodium chloride 250 mL (03/01/21 1047)   sodium chloride 10 mL/hr (03/02/21 1327)     LOS: 5 days    Time spent: 25 mins     Wyvonnia Dusky, MD [facility] Pager [phone]  If 7PM-7AM, please contact night-coverage 03/02/2021, 3:35 PM

## 2021-03-02 NOTE — Sedation Documentation (Signed)
Report called to 1C K[place]ie no sedation just local anesthesia.

## 2021-03-02 NOTE — Evaluation (Signed)
Physical Therapy Evaluation Patient Details Name: Shawn Andrews MRN: [id number] DOB: 02-15-1928 Today's Date: 03/02/2021  History of Present Illness  Pt is a 88 y/o M with PMH: CAD, HFrEF, BPH and chronic urine retention followed by [facility] (s/p suprapubic catheter placed on 02/20/21). Presented to ED d/t spasmodic lower abdominal pain radiating to the penis. In addition, found to be hyponatremic.  MD assessment includes: UTI, hyperkalemia, and hyponatremia.   Clinical Impression  Pt was pleasant and motivated to participate during the session and put forth good effort throughout. Pt required no physical assistance during the session and reported no adverse symptoms other than pain in the groin area.  Pt's standing BP during the session was 97/64 with SpO2 and HR WNL. Pt may progress to not requiring any PT services upon discharge but will continue to follow while in acute care and modify discharge recommendations as appropriate.  Pt will benefit from HHPT upon discharge to safely address deficits listed in patient problem list for decreased caregiver assistance and eventual return to PLOF.         Recommendations for follow up therapy are one component of a multi-disciplinary discharge planning process, led by the attending physician.  Recommendations may be updated based on patient status, additional functional criteria and insurance authorization.  Follow Up Recommendations Home health PT    Assistance Recommended at Discharge Intermittent Supervision/Assistance  Patient can return home with the following  Assist for transportation    Equipment Recommendations None recommended by PT  Recommendations for Other Services       Functional Status Assessment Patient has had a recent decline in their functional status and demonstrates the ability to make significant improvements in function in a reasonable and predictable amount of time.     Precautions /  Restrictions Precautions Precautions: Fall Restrictions Weight Bearing Restrictions: No Other Position/Activity Restrictions: significant orthostatic hypotension.      Mobility  Bed Mobility Overal bed mobility: Modified Independent             General bed mobility comments: Extra time and effort    Transfers Overall transfer level: Needs assistance Equipment used: None Transfers: Sit to/from Stand Sit to Stand: Supervision           General transfer comment: Good eccentric and concentric control    Ambulation/Gait Ambulation/Gait assistance: Supervision Gait Distance (Feet): 150 Feet Assistive device: None Gait Pattern/deviations: Step-through pattern, Decreased step length - right, Decreased step length - left       General Gait Details: Pt steady with ambulation without an AD with only mildly reduced cadencd; no adverse symptoms during the session with standing BP 97/64 after ambulation  Stairs            Wheelchair Mobility    Modified Rankin (Stroke Patients Only)       Balance Overall balance assessment: Needs assistance   Sitting balance-Leahy Scale: Normal     Standing balance support: During functional activity, No upper extremity supported Standing balance-Leahy Scale: Good                               Pertinent Vitals/Pain Pain Assessment Pain Assessment: 0-10 Pain Score: 6  Pain Location: penis/pubic area Pain Descriptors / Indicators: Cramping, Spasm Pain Intervention(s): Repositioned, Monitored during session, Patient requesting pain meds-RN notified    Home Living Family/patient expects to be discharged to:: Private residence Living Arrangements: Spouse/significant other Available Help at Discharge:  Family;Available 24 hours/day Type of Home: House Home Access: Level entry       Home Layout: One level Home Equipment: Cane - single point      Prior Function Prior Level of Function :  Independent/Modified Independent;Driving             Mobility Comments: Ind amb community distances without an AD, no fall history, exercises at [facility] 3-5x/wk doing both cardio and strength training ADLs Comments: Ind with ADLs     Hand Dominance        Extremity/Trunk Assessment   Upper Extremity Assessment Upper Extremity Assessment: Overall WFL for tasks assessed    Lower Extremity Assessment Lower Extremity Assessment: Overall WFL for tasks assessed       Communication   Communication: HOH  Cognition Arousal/Alertness: Awake/alert Behavior During Therapy: WFL for tasks assessed/performed Overall Cognitive Status: Within Functional Limits for tasks assessed                                          General Comments      Exercises Total Joint Exercises Ankle Circles/Pumps: AROM, Strengthening, Both, 10 reps Quad Sets: Strengthening, Both, 10 reps Gluteal Sets: Strengthening, Both, 10 reps Long Arc Quad: Strengthening, Both, 10 reps Knee Flexion: Strengthening, 10 reps, Both Other Exercises Other Exercises: Pt/spouse review on sitting EOB when first getting up and starting LAQs or seated march-in-place prior to standing and then standing marching before starting to walk to assess for dizziness Other Exercises: HEP education for BLE APs, QS, GS, and LAQs x 10 each 5-6x/day   Assessment/Plan    PT Assessment Patient needs continued PT services  PT Problem List Decreased activity tolerance       PT Treatment Interventions DME instruction;Gait training;Therapeutic activities;Functional mobility training;Therapeutic exercise;Balance training;Patient/family education    PT Goals (Current goals can be found in the Care Plan section)  Acute Rehab PT Goals Patient Stated Goal: No pain with mobility PT Goal Formulation: With patient Time For Goal Achievement: 03/15/21 Potential to Achieve Goals: Good    Frequency Min 2X/week     Co-evaluation                AM-PAC PT "6 Clicks" Mobility  Outcome Measure Help needed turning from your back to your side while in a flat bed without using bedrails?: None Help needed moving from lying on your back to sitting on the side of a flat bed without using bedrails?: A Little Help needed moving to and from a bed to a chair (including a wheelchair)?: A Little Help needed standing up from a chair using your arms (e.g., wheelchair or bedside chair)?: A Little Help needed to walk in hospital room?: A Little Help needed climbing 3-5 steps with a railing? : A Little 6 Click Score: 19    End of Session Equipment Utilized During Treatment: Gait belt Activity Tolerance: Patient tolerated treatment well Patient left: in bed;with call bell/phone within reach;with bed alarm set;with family/visitor present Nurse Communication: Mobility status PT Visit Diagnosis: Difficulty in walking, not elsewhere classified (R26.2)    Time: 2536-6440 PT Time Calculation (min) (ACUTE ONLY): 28 min   Charges:   PT Evaluation $PT Eval Moderate Complexity: 1 Mod PT Treatments $Therapeutic Exercise: 8-22 mins       D. Scott Meliyah Simon PT, DPT 03/02/21, 11:23 AM

## 2021-03-03 LAB — CBC
HCT: 32.9 % — ABNORMAL LOW (ref 39.0–52.0)
Hemoglobin: 11 g/dL — ABNORMAL LOW (ref 13.0–17.0)
MCH: 27.9 pg (ref 26.0–34.0)
MCHC: 33.4 g/dL (ref 30.0–36.0)
MCV: 83.5 fL (ref 80.0–100.0)
Platelets: 212 10*3/uL (ref 150–400)
RBC: 3.94 MIL/uL — ABNORMAL LOW (ref 4.22–5.81)
RDW: 14.2 % (ref 11.5–15.5)
WBC: 9.2 10*3/uL (ref 4.0–10.5)
nRBC: 0 % (ref 0.0–0.2)

## 2021-03-03 LAB — BASIC METABOLIC PANEL
Anion gap: 8 (ref 5–15)
BUN: 38 mg/dL — ABNORMAL HIGH (ref 8–23)
CO2: 23 mmol/L (ref 22–32)
Calcium: 8.8 mg/dL — ABNORMAL LOW (ref 8.9–10.3)
Chloride: 103 mmol/L (ref 98–111)
Creatinine, Ser: 1.29 mg/dL — ABNORMAL HIGH (ref 0.61–1.24)
GFR, Estimated: 53 mL/min — ABNORMAL LOW (ref >60)
Glucose, Bld: 104 mg/dL — ABNORMAL HIGH (ref 70–99)
Potassium: 4.6 mmol/L (ref 3.5–5.1)
Sodium: 134 mmol/L — ABNORMAL LOW (ref 135–145)

## 2021-03-03 NOTE — Progress Notes (Signed)
° °  89 year old male who developed Foley dependent urinary retention in late December 2022, was seen by Dr. Erlene Quan in clinic in early January and failed a voiding trial.  He was not able to perform CIC, and ultimately has been admitted this week with suprapubic tube and severe bladder spasms/bladder pain.  The suprapubic pigtail catheter was changed yesterday afternoon by interventional radiology to a [address] [employer], and his pain is completely resolved since that time.  Urine is clear yellow this morning and abdominal exam benign.  He denies any problems this morning and feels ready for discharge.  I reviewed his CT urogram that shows a 90 g prostate with thickened and trabeculated bladder, no other worrisome etiology for prior gross hematuria.  We discussed options for his BPH and urinary retention at length including intermittent catheterizations(he feels uncomfortable with this moving forward secondary to his age and tremor), monthly changes of a suprapubic tube indefinitely, or consideration of an outlet procedure with HOLEP. We discussed the risks and benefits of HoLEP at length.  The procedure requires general anesthesia and takes 1 to 2 hours, and a holmium laser is used to enucleate the prostate and push this tissue into the bladder.  A morcellator is then used to remove this tissue, which is sent for pathology.  The vast majority(>95%) of patients are able to discharge the same day with a catheter in place for 2 to 3 days, and will follow-up in clinic for a voiding trial.  We specifically discussed the risks of bleeding, infection, retrograde ejaculation, temporary urgency and urge incontinence, very low risk of long-term incontinence, urethral stricture/bladder neck contracture, pathologic evaluation of prostate tissue and possible detection of prostate cancer or other malignancy, and possible need for additional procedures.  We discussed the higher risk of incontinence with his age and  long-term outlet obstruction, and I would anticipate at least 2 to 4 months of incontinence with need for pads/depends, but less than 5% risk of long-term incontinence.  They would like to continue the suprapubic tube for now and see how he tolerates this over the next few weeks to months.  I think this is very reasonable.  Recommend continuing trospium 20 mg twice daily as outpatient Okay for discharge from urology perspective We will coordinate outpatient follow-up in 1 month for suprapubic change which in [place]   A total of 30 minutes was spent on the floor with greater than 50% spent in counseling and coordination of care with the patient and his family regarding BPH and urinary retention and treatment options including CIC, chronic suprapubic tube with monthly changes, or outlet procedure with HOLEP.   Billey Co, MD

## 2021-03-03 NOTE — Progress Notes (Signed)
PROGRESS NOTE    Shawn Andrews  JHE:[id number] DOB: 09/10/1928 DOA: 02/25/2021 PCP: Leonel Ramsay, MD    Assessment & Plan:   Principal Problem:   Hyponatremia Active Problems:   Essential hypertension   Benign prostatic hyperplasia   Essential tremor   CKD (chronic kidney disease) stage 3, GFR 30-59 ml/min (HCC)   CAD (coronary artery disease)   HFrEF (heart failure with reduced ejection fraction) (HCC)   UTI (urinary tract infection)   Suprapubic catheter (HCC)   Lower abdominal pain   Generalized abdominal pain   Hyponatremia: almost WNL. Continue w/ fluid restriction   Lower abdominal pain: w/ penis pain. Likely secondary bladder spasms. S/p suprapubic cath exchange by IR 03/02/21, with no penis pain currently. Continue on norco, myrbetriq & trospium as per urology   Hyperkalemia: resolved   Constipation: resolved    UTI: w/ suprapubic catheter.  Likely colonization.  Discontinued antibiotics with multiple species seen on urine culture.  UTI ruled out.   Chronic systolic CHF: continue on lasix, metoprolol. Continue to hold losartan. Monitor I/Os   CKDIIIa: Cr continues to trend down.    BPH: continue on cardura, finasteride    HTN: continue on reduced dose of metoprolol. Continue to hold losartan. Hold for MAP < 65 and/or HR < 65   DVT prophylaxis: lovenox  Code Status: full  Family Communication: discussed pt's care w/ pt's family at bedside and answered their questions  Disposition Plan: likely d/c back home tomorrow    Level of care: Med-Surg  Status is: Inpatient Remains inpatient appropriate because: severity of illness, likely d/c home tomorrow      Consultants:  Urology   Procedures:  Antimicrobials:   Subjective: Pt denies any penis pain currently   Objective: Vitals:   03/02/21 2012 03/02/21 2012 03/03/21 0536 03/03/21 0757  BP: 140/60 140/60 129/64 120/69  Pulse:  88 74 70  Resp:  18 18 18   Temp:  97.9 F (36.6 C) 97.9 F  (36.6 C) 97.7 F (36.5 C)  TempSrc:      SpO2:  95% 96% 95%  Weight:      Height:        Intake/Output Summary (Last 24 hours) at 03/03/2021 0817 Last data filed at 03/03/2021 0538 Gross per 24 hour  Intake 955.9 ml  Output 4200 ml  Net -3244.1 ml   Filed Weights   02/25/21 1126  Weight: 78 kg    Examination:  General exam: appears calm & comfortable   Respiratory system: clear breath sounds b/l  Cardiovascular system: S1&S2+. No rubs or gallops  Gastrointestinal system: abd is soft, NT, ND & normal bowel sounds  Central nervous system: alert and oriented. Moves all extremities    Psychiatry: Judgement and insight appears normal. Flat mood and affect     Data Reviewed: I have personally reviewed following labs and imaging studies  CBC: Recent Labs  Lab 02/25/21 1215 02/27/21 0354 02/28/21 0317 03/01/21 0349 03/02/21 0649 03/03/21 0530  WBC 6.3  --   --  6.4 6.8 9.2  HGB 10.9* 11.6* 10.7* 10.2* 10.7* 11.0*  HCT 30.6*  --   --  29.6* 31.5* 32.9*  MCV 82.3  --   --  82.5 82.7 83.5  PLT 198  --   --  212 192 185   Basic Metabolic Panel: Recent Labs  Lab 02/27/21 0354 02/28/21 0317 03/01/21 0349 03/02/21 0649 03/03/21 0530  NA 128* 128* 130* 132* 134*  K 4.9 4.6 5.2* 5.2*  4.6  CL 100 98 98 102 103  CO2 23 23 26 26 23   GLUCOSE 115* 119* 96 102* 104*  BUN 34* 41* 37* 32* 38*  CREATININE 1.49* 1.53* 1.41* 1.39* 1.29*  CALCIUM 9.0 8.6* 8.5* 8.6* 8.8*   GFR: Estimated Creatinine Clearance: 39.3 mL/min (A) (by C-G formula based on SCr of 1.29 mg/dL (H)). Liver Function Tests: Recent Labs  Lab 02/25/21 1215  AST 23  ALT 12  ALKPHOS 56  BILITOT 0.9  PROT 5.6*  ALBUMIN 3.3*   No results for input(s): LIPASE, AMYLASE in the last 168 hours. No results for input(s): AMMONIA in the last 168 hours. Coagulation Profile: No results for input(s): INR, PROTIME in the last 168 hours. Cardiac Enzymes: No results for input(s): CKTOTAL, CKMB, CKMBINDEX,  TROPONINI in the last 168 hours. BNP (last 3 results) No results for input(s): PROBNP in the last 8760 hours. HbA1C: No results for input(s): HGBA1C in the last 72 hours. CBG: No results for input(s): GLUCAP in the last 168 hours. Lipid Profile: No results for input(s): CHOL, HDL, LDLCALC, TRIG, CHOLHDL, LDLDIRECT in the last 72 hours. Thyroid Function Tests: No results for input(s): TSH, T4TOTAL, FREET4, T3FREE, THYROIDAB in the last 72 hours. Anemia Panel: No results for input(s): VITAMINB12, FOLATE, FERRITIN, TIBC, IRON, RETICCTPCT in the last 72 hours. Sepsis Labs: No results for input(s): PROCALCITON, LATICACIDVEN in the last 168 hours.  Recent Results (from the past 240 hour(s))  Urine Culture     Status: Abnormal   Collection Time: 02/25/21 11:56 AM   Specimen: Urine, Suprapubic  Result Value Ref Range Status   Specimen Description   Final    URINE, SUPRAPUBIC Performed at [facility], [address], [place], [place] [address]    Special Requests   Final    NONE Performed at [facility], [address] [place], [place] [address]    Culture MULTIPLE SPECIES PRESENT, SUGGEST RECOLLECTION (A)  Final   Report Status 02/27/2021 FINAL  Final  Resp Panel by RT-PCR (Flu A&B, Covid) Nasopharyngeal Swab     Status: None   Collection Time: 02/25/21  2:30 PM   Specimen: Nasopharyngeal Swab; Nasopharyngeal(NP) swabs in vial transport medium  Result Value Ref Range Status   SARS Coronavirus 2 by RT PCR NEGATIVE NEGATIVE Final    Comment: (NOTE) SARS-CoV-2 target nucleic acids are NOT DETECTED.  The SARS-CoV-2 RNA is generally detectable in upper respiratory specimens during the acute phase of infection. The lowest concentration of SARS-CoV-2 viral copies this assay can detect is 138 copies/mL. A negative result does not preclude SARS-Cov-2 infection and should not be used as the sole basis for treatment or other patient management decisions. A negative  result may occur with  improper specimen collection/handling, submission of specimen other than nasopharyngeal swab, presence of viral mutation(s) within the areas targeted by this assay, and inadequate number of viral copies(<138 copies/mL). A negative result must be combined with clinical observations, patient history, and epidemiological information. The expected result is Negative.  Fact Sheet for Patients:  EntrepreneurPulse.com.au  Fact Sheet for Healthcare Providers:  IncredibleEmployment.be  This test is no t yet approved or cleared by the [place] FDA and  has been authorized for detection and/or diagnosis of SARS-CoV-2 by FDA under an Emergency Use Authorization (EUA). This EUA will remain  in effect (meaning this test can be used) for the duration of the COVID-19 declaration under Section 564(b)(1) of the Act, 21 U.S.C.section 360bbb-3(b)(1), unless the authorization is  terminated  or revoked sooner.       Influenza A by PCR NEGATIVE NEGATIVE Final   Influenza B by PCR NEGATIVE NEGATIVE Final    Comment: (NOTE) The Xpert Xpress SARS-CoV-2/FLU/RSV plus assay is intended as an aid in the diagnosis of influenza from Nasopharyngeal swab specimens and should not be used as a sole basis for treatment. Nasal washings and aspirates are unacceptable for Xpert Xpress SARS-CoV-2/FLU/RSV testing.  Fact Sheet for Patients: EntrepreneurPulse.com.au  Fact Sheet for Healthcare Providers: IncredibleEmployment.be  This test is not yet approved or cleared by the [place] FDA and has been authorized for detection and/or diagnosis of SARS-CoV-2 by FDA under an Emergency Use Authorization (EUA). This EUA will remain in effect (meaning this test can be used) for the duration of the COVID-19 declaration under Section 564(b)(1) of the Act, 21 U.S.C. section 360bbb-3(b)(1), unless the authorization is  terminated or revoked.  Performed at [facility], [address] [place], [place] [address]          Radiology Studies: IR Catheter Tube Change  Result Date: 03/02/2021 INDICATION: Continued irritation from suprapubic catheter, refractory to conservative therapies EXAM: Exchange and conversion of suprapubic catheter using fluoroscopic guidance COMPARISON:  None. MEDICATIONS: Per EMR ANESTHESIA/SEDATION: Local analgesia CONTRAST:  10 mL-administered into the collecting system(s) FLUOROSCOPY: Radiation Exposure Index (as provided by the fluoroscopic device): 0.8 minutes (5 mGy) COMPLICATIONS: None immediate. PROCEDURE: Informed written consent was obtained from the patient after a thorough discussion of the procedural risks, benefits and alternatives. All questions were addressed. Maximal Sterile Barrier Technique was utilized including caps, mask, sterile gowns, sterile gloves, sterile drape, hand hygiene and skin antiseptic. A timeout was performed prior to the initiation of the procedure. The patient was placed supine on the exam table. The lower abdomen was prepped and draped in the standard sterile fashion with inclusion of the existing suprapubic catheter within the sterile field. Injection of the existing suprapubic catheter demonstrated appropriate location within the urinary bladder. The locking loop was released, and over an Amplatz wire, the existing catheter was removed. Over this wire, a new 80 French balloon Foley catheter was advanced into the bladder lumen. The retention balloon was inflated with 10 mL of dilute contrast material and withdrawn to the anterior urinary bladder wall. Injection of the new Foley catheter demonstrated appropriate location within the urinary bladder. It was secured to the skin using silk suture. A clean dressing was placed. It was attached to bag drainage. The patient tolerated the procedure well without immediate complication. IMPRESSION:  Successful conversion of existing suprapubic catheter to a 16 French balloon Foley catheter. Electronically Signed   By: Albin Felling M.D.   On: 03/02/2021 14:26        Scheduled Meds:  bisacodyl  10 mg Oral Daily   clopidogrel  75 mg Oral Daily   docusate sodium  100 mg Oral Daily   doxazosin  4 mg Oral Daily   enoxaparin (LOVENOX) injection  40 mg Subcutaneous Q24H   famotidine  20 mg Oral QHS   finasteride  5 mg Oral Daily   furosemide  20 mg Oral QODAY   isosorbide mononitrate  30 mg Oral Daily   lactulose  30 g Oral BID   metoprolol tartrate  12.5 mg Oral BID   mirabegron ER  50 mg Oral Daily   primidone  100 mg Oral TID   rosuvastatin  5 mg Oral Daily   sodium chloride flush  3 mL Intravenous Q12H  trospium  20 mg Oral BID   Continuous Infusions:  sodium chloride Stopped (03/01/21 1053)   sodium chloride 10 mL/hr (03/02/21 1327)     LOS: 6 days    Time spent: 20 mins     Wyvonnia Dusky, MD [facility] Pager [phone]  If 7PM-7AM, please contact night-coverage 03/03/2021, 8:17 AM

## 2021-03-03 NOTE — Discharge Instructions (Signed)

## 2021-03-04 LAB — BASIC METABOLIC PANEL
Anion gap: 7 (ref 5–15)
BUN: 41 mg/dL — ABNORMAL HIGH (ref 8–23)
CO2: 24 mmol/L (ref 22–32)
Calcium: 8.6 mg/dL — ABNORMAL LOW (ref 8.9–10.3)
Chloride: 102 mmol/L (ref 98–111)
Creatinine, Ser: 1.41 mg/dL — ABNORMAL HIGH (ref 0.61–1.24)
GFR, Estimated: 47 mL/min — ABNORMAL LOW (ref >60)
Glucose, Bld: 106 mg/dL — ABNORMAL HIGH (ref 70–99)
Potassium: 4.4 mmol/L (ref 3.5–5.1)
Sodium: 133 mmol/L — ABNORMAL LOW (ref 135–145)

## 2021-03-04 LAB — CBC
HCT: 30.7 % — ABNORMAL LOW (ref 39.0–52.0)
Hemoglobin: 10.4 g/dL — ABNORMAL LOW (ref 13.0–17.0)
MCH: 28.7 pg (ref 26.0–34.0)
MCHC: 33.9 g/dL (ref 30.0–36.0)
MCV: 84.6 fL (ref 80.0–100.0)
Platelets: 192 10*3/uL (ref 150–400)
RBC: 3.63 MIL/uL — ABNORMAL LOW (ref 4.22–5.81)
RDW: 14.2 % (ref 11.5–15.5)
WBC: 6.9 10*3/uL (ref 4.0–10.5)
nRBC: 0 % (ref 0.0–0.2)

## 2021-03-04 MED ORDER — HYDROCODONE-ACETAMINOPHEN 5-325 MG PO TABS: 1.0000 | ORAL_TABLET | Freq: Four times a day (QID) | ORAL | 0 refills | Status: AC | PRN

## 2021-03-04 MED ORDER — TROSPIUM CHLORIDE 20 MG PO TABS: 20.0000 mg | ORAL_TABLET | Freq: Two times a day (BID) | ORAL | 0 refills | Status: AC

## 2021-03-04 NOTE — TOC Transition Note (Signed)
Transition of Care [facility]) - CM/SW Discharge Note   Patient Details  Name: MARKELL SCIASCIA MRN: [id number] Date of Birth: 25-Jun-1928  [facility]) CM/SW Contact:  Izola Price, RN Phone Number: 03/04/2021, 12:11 PM   Clinical Narrative:  2/12: Discharge orders in for today. Patient has declined [facility] services and understands to reach out to PCP if that need changes. No DME recommended, none at home use pta, but also understands to reach out to PCP if that changes. Spouse drives and will transport patient home. No further TOC needs at this time for discharge plan. Simmie Davies RN CM     Final next level of care: Home/Self Care (Declines HH along with spouse) Barriers to Discharge: Barriers Resolved   Patient Goals and CMS Choice     Choice offered to / list presented to : Patient, Spouse (Regarding HH choices. Declined HH services.)  Discharge Placement                       Discharge Plan and Services   Discharge Planning Services: CM Consult Post Acute Care Choice: NA (Declined HH)          DME Arranged: N/A DME Agency: NA       HH Arranged: NA HH Agency: NA        Social Determinants of Health (SDOH) Interventions     Readmission Risk Interventions No flowsheet data found.

## 2021-03-04 NOTE — TOC Initial Note (Signed)
Transition of Care [facility]) - Initial/Assessment Note    Patient Details  Name: Shawn Andrews MRN: [id number] Date of Birth: 1928-12-01  Transition of Care [facility]) CM/SW Contact:    Izola Price, RN Phone Number: 03/04/2021, 12:05 PM  Clinical Narrative:  2/11: Spoke with spouse with patient listening in on conversation in patient's room. Patient lives with spouse. PCP and RX listed in Epic confirmed. No DME use at home. Chronic foley/suprapubic in place for urinary retention with self care/spouse supportive care in place PTA. No transportation issues or issues obtaining medications, getting to appointments or grocery store. No steps in house. PT/OT recommended, but patient and spouse do not feel needed at this time. [place] services declined. Patient was up and gave self am care without problems. Discussed how to reach out to PCP if needs change after discharge or any equipment might me helpful/needed. Verbalized understanding.  No other issue/question/concerns expressed discussing discharge planning. Simmie Davies RN CM                Expected Discharge Plan: [place] Barriers to Discharge: Barriers Resolved   Patient Goals and CMS Choice     Choice offered to / list presented to : Patient, Spouse (Regarding HH choices. Declined HH services.)  Expected Discharge Plan and Services Expected Discharge Plan: [place]   Discharge Planning Services: CM Consult Post Acute Care Choice: NA (Declined HH) Living arrangements for the past 2 months: Single Family Home Expected Discharge Date: 03/04/21               DME Arranged: N/A DME Agency: NA       HH Arranged: NA HH Agency: NA        Prior Living Arrangements/Services Living arrangements for the past 2 months: Single Family Home Lives with:: Spouse Patient language and need for interpreter reviewed:: No Do you feel safe going back to the place where you live?: Yes      Need for Family Participation  in Patient Care: Yes (Comment) Care giver support system in place?: Yes (comment)   Criminal Activity/Legal Involvement Pertinent to Current Situation/Hospitalization: No - Comment as needed  Activities of Daily Living Home Assistive Devices/Equipment: None ADL Screening (condition at time of admission) Patient's cognitive ability adequate to safely complete daily activities?: Yes Is the patient deaf or have difficulty hearing?: Yes Does the patient have difficulty seeing, even when wearing glasses/contacts?: Yes Does the patient have difficulty concentrating, remembering, or making decisions?: No Patient able to express need for assistance with ADLs?: Yes Does the patient have difficulty dressing or bathing?: No Independently performs ADLs?: Yes (appropriate for developmental age) Communication: Independent Does the patient have difficulty walking or climbing stairs?: No Weakness of Legs: None Weakness of Arms/Hands: None  Permission Sought/Granted Permission sought to share information with : Other (comment) (Not required, declined HH.)                Emotional Assessment Appearance:: Appears stated age Attitude/Demeanor/Rapport: Engaged Affect (typically observed): Accepting Orientation: : Oriented to Self, Oriented to  Time, Oriented to Situation, Oriented to Place Alcohol / Substance Use: Not Applicable Psych Involvement: No (comment)  Admission diagnosis:  Hyponatremia [E87.1] Acute cystitis with hematuria [N30.01] Patient Active Problem List   Diagnosis Date Noted   Generalized abdominal pain    Lower abdominal pain 02/26/2021   UTI (urinary tract infection) 02/25/2021   Suprapubic catheter ([place]) 02/25/2021   CKD (chronic kidney disease), stage IIIa  89/38/1017   Chronic systolic CHF (congestive heart failure) ([place]) 01/17/2021   Pleural effusion 01/17/2021   Acute on chronic systolic CHF (congestive heart failure) ([place]) 01/17/2021   Constipation 12/13/2020    Hyponatremia 12/13/2020   COVID-19 virus infection 12/08/2020   NSTEMI (non-ST elevated myocardial infarction) ([place]) 10/16/2020   HFrEF (heart failure with reduced ejection fraction) ([place]) 10/16/2020   CAD (coronary artery disease) 10/08/2020   Elevated vitamin B12 level 08/31/2020   Leg DVT (deep venous thromboembolism), acute, left ([place]) 08/09/2019   Skin lesion 07/18/2016   Health maintenance examination 07/19/2015   Advanced care planning/counseling discussion 07/05/2014   Memory deficit 07/05/2014   CKD (chronic kidney disease) stage 3, GFR 30-59 ml/min ([place]) 06/23/2013   Post-nasal drainage 12/22/2012   Medicare annual wellness visit, subsequent 06/15/2012   Lumbosacral radiculopathy at L5 06/17/2011   High frequency hearing loss 10/18/2010   Vitamin D deficiency 11/15/2008   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 [place] [address] [place] [address] Phone: [phone] Fax: [phone]  [facility] ([facility] ) - [place], [place] - [address] [place] [address] Phone: [phone] Fax: [phone]     Social Determinants of Health (SDOH) Interventions    Readmission Risk Interventions No flowsheet data found.

## 2021-03-04 NOTE — Discharge Summary (Addendum)
Physician Discharge Summary  Shawn Andrews MLY:[id number] DOB: 09-30-1928 DOA: 02/25/2021  PCP: Leonel Ramsay, MD  Admit date: 02/25/2021 Discharge date: 03/04/2021  Admitted From: home  Disposition: home   Recommendations for Outpatient Follow-up:  Follow up with PCP in 1-2 weeks F/u w/ urology, Dr. Erlene Quan, in 4 weeks  Home Health:  Equipment/Devices:  Discharge Condition: stable  CODE STATUS: Full  Diet recommendation: Heart Healthy  Brief/Interim Summary: HPI was taken from Dr. Tonie Griffith: Shawn Andrews is a 88 y.o. male with medical history significant for CAD s/p MI in Sept 2022 with stents placed, HFrEF, BPH, HTN, HLD, essential tremor and urinary retention who had suprapubic catheter placed last week.  He presented to the emergency room at with abdominal pain that began this morning.  Patient reports lower abdominal and perineum pain that was so severe he had to walk around the house and could not lay down secondary to pain increasing.  Denies any flank pain.  He denies any fever or chills.  He has not had any nausea or vomiting.  Reports he has been making urine into the catheter bag and he had a small amount of urine from urethra last night.  Denies any injury or trauma to his abdomen.  He has not noticed any discoloration in the color of the urine and has not seen any blood in the urine.  Reports he was told to drink 64 ounces of fluid a day but a few days ago when his wife called their primary doctor to report he was not making as much urine in the catheter she was told that he is probably only drinking half of what he should and to double his water intake.  For the last few days he has been drinking 8-10 16.9 oz bottles of water a day.  Lives with his wife.  No tobacco, alcohol, illicit drug use   ED Course: Shawn Andrews is been hemodynamically stable in the emergency room.  And to be hyponatremic in the emergency room.  He has been afebrile and has no leukocytosis.  Urinalysis was  yellow and cloudy with moderate leukocytes but negative nitrites.  Elevated protein and hemoglobin present in the urine.  CBC is unremarkable.  Patient has a chronic anemia that is stable.  Sodium 123 potassium 4.9 chloride 94 bicarb 23 creatinine 1.29 BUN 25 glucose 167 calcium 8.7 albumin 3.3 alkaline phosphatase 56 AST 23 ALT is 12 bilirubin 0.9. Covid and influenza negative.  Patient was given dose of ciprofloxacin with his reported penicillin allergy in the emergency room.  Hospitalist service asked to admit for further management  As per Dr. Leslye Peer: 89 year old man with past medical history of congestive heart failure with reduced ejection fraction, BPH, hyperlipidemia, hypertension, osteoarthritis.  He recently had a suprapubic catheter placed on 02/20/2021.  Patient coming in with a lot of lower abdominal pain and penis pain and initially started on antibiotics for suspected UTI.  I started the patient on short acting Ditropan for bladder spasms.  Dr. Bernardo Heater urology changed over to [facility] daily dosing.  Patient still having lower abdominal pain.  As per Dr. Jimmye Norman 2/8-2/12/23: Pt started to c/o pain at the base of his penis and pt requested that urology be consulted. The pain was initially thought to be secondary bladder spasms. Pt was put on myrbetriq and trospium as per uro. Pt's pain did initially improve but then came back so IR came to see the pt and the suprapubic cath was retracted 2cm  at bedside. The following day, pt was taken down to the procedure room to have the suprapubic cath exchanged by IR. After the procedure pt has had minimal to no pain. PT/OT evaluated the pt and recommended HH. For more information, please see previous progress/consult notes.    Discharge Diagnoses:  Principal Problem:   Hyponatremia Active Problems:   Essential hypertension   Benign prostatic hyperplasia   Essential tremor   CKD (chronic kidney disease) stage 3, GFR 30-59 ml/min (HCC)   CAD  (coronary artery disease)   HFrEF (heart failure with reduced ejection fraction) (HCC)   UTI (urinary tract infection)   Suprapubic catheter (HCC)   Lower abdominal pain   Generalized abdominal pain  Hyponatremia: almost WNL. Continue w/ fluid restriction    Lower abdominal pain: w/ penis pain. Likely secondary bladder spasms. S/p suprapubic cath exchange by IR 03/02/21, with no penis pain currently. Continue on norco, trospium as per urology    Hyperkalemia: resolved    Constipation: resolved    Suprapubic catheter.  Likely colonization.  Discontinued antibiotics with multiple species seen on urine culture.  UTI ruled out.   Chronic systolic CHF: continue on lasix, metoprolol. Continue to hold losartan. Monitor I/Os    CKDIIIa: Cr is labile    BPH: continue on cardura, finasteride    HTN: continue on reduced dose of metoprolol. Continue to hold losartan. Hold for MAP < 65 and/or HR < 65    Discharge Instructions  Discharge Instructions     Diet - low sodium heart healthy   Complete by: As directed    Discharge instructions   Complete by: As directed    F/u w/ PCP in 1-2 weeks. F/u w/ urology, Dr. Erlene Quan, in 4 weeks   Increase activity slowly   Complete by: As directed       Allergies as of 03/04/2021       Reactions   Penicillins Swelling   Of tongue Has patient had a PCN reaction causing immediate rash, facial/tongue/throat swelling, SOB or lightheadedness with hypotension: Yes Has patient had a PCN reaction causing severe rash involving mucus membranes or skin necrosis: No Has patient had a PCN reaction that required hospitalization: No Has patient had a PCN reaction occurring within the last 10 years: No If all of the above answers are "NO", then may proceed with Cephalosporin use.   Cialis [tadalafil] Other (See Comments)   Indigestion and "felt like crap"   Lipitor [atorvastatin] Other (See Comments)   Severe myalgias   Lisinopril Cough   Dry nagging cough         Medication List     STOP taking these medications    aspirin 81 MG EC tablet Commonly known as: Aspirin 81       TAKE these medications    acetaminophen 650 MG CR tablet Commonly known as: Tylenol 8 Hour Take 1 tablet (650 mg total) by mouth 2 (two) times a day.   amLODipine 2.5 MG tablet Commonly known as: NORVASC Take 1 tablet (2.5 mg total) by mouth daily.   atorvastatin 40 MG tablet Commonly known as: LIPITOR Take 40 mg by mouth daily.   clopidogrel 75 MG tablet Commonly known as: PLAVIX Take 1 tablet by mouth daily.   Co Q-10 100 MG Caps Take 1 capsule by mouth daily.   docusate sodium 100 MG capsule Commonly known as: Colace Take 1 capsule (100 mg total) by mouth daily.   doxazosin 4 MG tablet Commonly known as: CARDURA  TAKE 1 TABLET BY MOUTH  DAILY   famotidine 20 MG tablet Commonly known as: PEPCID TAKE 1 TABLET BY MOUTH AT  BEDTIME   finasteride 5 MG tablet Commonly known as: PROSCAR TAKE 1 TABLET BY MOUTH  DAILY   furosemide 20 MG tablet Commonly known as: LASIX Take 1 tablet by mouth every other day.   HYDROcodone-acetaminophen 5-325 MG tablet Commonly known as: NORCO/VICODIN Take 1 tablet by mouth every 6 (six) hours as needed for up to 3 days for severe pain or moderate pain.   isosorbide mononitrate 30 MG 24 hr tablet Commonly known as: IMDUR Take 1 tablet by mouth daily.   loratadine 10 MG tablet Commonly known as: CLARITIN Take 1 tablet (10 mg total) by mouth daily as needed for allergies.   losartan 50 MG tablet Commonly known as: COZAAR Take 1 tablet (50 mg total) by mouth daily.   metoprolol tartrate 50 MG tablet Commonly known as: LOPRESSOR Take 0.5 tablets (25 mg total) by mouth 2 (two) times daily.   nitroGLYCERIN 0.4 MG SL tablet Commonly known as: NITROSTAT Place under the tongue.   polyethylene glycol powder 17 GM/SCOOP powder Commonly known as: GLYCOLAX/MIRALAX Take 8.5-17 g by mouth daily as needed  for moderate constipation.   primidone 50 MG tablet Commonly known as: MYSOLINE Take 2 tablets (100 mg total) by mouth 3 (three) times daily.   rosuvastatin 5 MG tablet Commonly known as: CRESTOR Take 5 mg by mouth daily.   trospium 20 MG tablet Commonly known as: SANCTURA Take 1 tablet (20 mg total) by mouth 2 (two) times daily.   vitamin B-12 500 MCG tablet Commonly known as: CYANOCOBALAMIN Take 1 tablet (500 mcg total) by mouth once a week.   Vitamin D3 25 MCG (1000 UT) Caps Take 1 capsule (1,000 Units total) by mouth daily.        Allergies  Allergen Reactions   Penicillins Swelling    Of tongue Has patient had a PCN reaction causing immediate rash, facial/tongue/throat swelling, SOB or lightheadedness with hypotension: Yes Has patient had a PCN reaction causing severe rash involving mucus membranes or skin necrosis: No Has patient had a PCN reaction that required hospitalization: No Has patient had a PCN reaction occurring within the last 10 years: No If all of the above answers are "NO", then may proceed with Cephalosporin use.   Cialis [Tadalafil] Other (See Comments)    Indigestion and "felt like crap"   Lipitor [Atorvastatin] Other (See Comments)    Severe myalgias   Lisinopril Cough    Dry nagging cough    Consultations: Urology  IR    Procedures/Studies: CT ABDOMEN PELVIS W CONTRAST  Result Date: 02/25/2021 CLINICAL DATA:  Abdominal pain.  Suprapubic tenderness. EXAM: CT ABDOMEN AND PELVIS WITH CONTRAST TECHNIQUE: Multidetector CT imaging of the abdomen and pelvis was performed using the standard protocol following bolus administration of intravenous contrast. RADIATION DOSE REDUCTION: This exam was performed according to the departmental dose-optimization program which includes automated exposure control, adjustment of the mA and/or kV according to patient size and/or use of iterative reconstruction technique. CONTRAST:  167mL OMNIPAQUE IOHEXOL 300 MG/ML   SOLN COMPARISON:  02/05/2021 there is a FINDINGS: Lower chest: Small to moderate left pleural effusion which is similar in volume to the previous exam. Hepatobiliary: No focal liver abnormality. Gallbladder appears unremarkable. No biliary ductal dilatation. Pancreas: Unremarkable. No pancreatic ductal dilatation or surrounding inflammatory changes. Spleen: Normal in size without focal abnormality. Adrenals/Urinary Tract: Normal adrenal glands.  Bilateral kidney cysts are again noted. The largest arises off the lateral cortex of interpolar right kidney measuring 3.6 cm. No kidney stones identified bilaterally. New bilateral pelvocaliectasis compared with 02/05/2021. There is also mild bilateral hydroureter to the level of the urinary bladder. Suprapubic pigtail catheter is identified within the thick walled urinary bladder. Several small foci of gas within the wall of the bladder dome, similar to previous exam, image 66/5. Stomach/Bowel: Stomach appears normal. No bowel wall thickening, inflammation, or distension. Sigmoid diverticulosis without signs of acute diverticulitis. Vascular/Lymphatic: Aortic atherosclerosis. No enlarged abdominal or pelvic lymph nodes. Reproductive: The prostate gland appears enlarged measuring 6.2 by 5.1 by 5.9 cm (volume = 98 cm^3) Other: No free fluid or fluid collections. Musculoskeletal: No acute or significant osseous findings. Lumbar spondylosis. Chronic bilateral L5 pars defects noted with first degree anterolisthesis of L5 on S1. IMPRESSION: 1. Suprapubic pigtail catheter appears appropriately positioned within decompressed, thick walled urinary bladder. There is mild upstream ureterectasis and pelvocaliectasis. 2. Enlarged prostate gland. 3. Small to moderate left pleural effusion. 4. Chronic bilateral L5 pars defects with first degree anterolisthesis of L5 on S1. 5. Aortic Atherosclerosis (ICD10-I70.0). Electronically Signed   By: Kerby Moors M.D.   On: 02/25/2021 13:52    IR Catheter Tube Change  Result Date: 03/02/2021 INDICATION: Continued irritation from suprapubic catheter, refractory to conservative therapies EXAM: Exchange and conversion of suprapubic catheter using fluoroscopic guidance COMPARISON:  None. MEDICATIONS: Per EMR ANESTHESIA/SEDATION: Local analgesia CONTRAST:  10 mL-administered into the collecting system(s) FLUOROSCOPY: Radiation Exposure Index (as provided by the fluoroscopic device): 0.8 minutes (5 mGy) COMPLICATIONS: None immediate. PROCEDURE: Informed written consent was obtained from the patient after a thorough discussion of the procedural risks, benefits and alternatives. All questions were addressed. Maximal Sterile Barrier Technique was utilized including caps, mask, sterile gowns, sterile gloves, sterile drape, hand hygiene and skin antiseptic. A timeout was performed prior to the initiation of the procedure. The patient was placed supine on the exam table. The lower abdomen was prepped and draped in the standard sterile fashion with inclusion of the existing suprapubic catheter within the sterile field. Injection of the existing suprapubic catheter demonstrated appropriate location within the urinary bladder. The locking loop was released, and over an Amplatz wire, the existing catheter was removed. Over this wire, a new 54 French balloon Foley catheter was advanced into the bladder lumen. The retention balloon was inflated with 10 mL of dilute contrast material and withdrawn to the anterior urinary bladder wall. Injection of the new Foley catheter demonstrated appropriate location within the urinary bladder. It was secured to the skin using silk suture. A clean dressing was placed. It was attached to bag drainage. The patient tolerated the procedure well without immediate complication. IMPRESSION: Successful conversion of existing suprapubic catheter to a 16 French balloon Foley catheter. Electronically Signed   By: Albin Felling M.D.   On:  03/02/2021 14:26   DG Abd 2 Views  Result Date: 02/27/2021 CLINICAL DATA:  Lower abdominal pain EXAM: ABDOMEN - 2 VIEW COMPARISON:  CT done on 02/25/2021 FINDINGS: Bowel gas pattern is nonspecific. Moderate amount of stool is seen in the colon and rectum. Suprapubic cystostomy catheter is seen. There is breathing motion in the upright radiograph limiting evaluation for pneumoperitoneum. As far as seen, there is no definite demonstrable pneumoperitoneum. IMPRESSION: Nonspecific bowel gas pattern. Electronically Signed   By: Elmer Picker M.D.   On: 02/27/2021 10:22   CT HEMATURIA WORKUP  Result Date: 02/05/2021 CLINICAL DATA:  Hematuria. EXAM: CT ABDOMEN AND PELVIS WITHOUT AND WITH CONTRAST TECHNIQUE: Multidetector CT imaging of the abdomen and pelvis was performed following the standard protocol before and following the bolus administration of intravenous contrast. RADIATION DOSE REDUCTION: This exam was performed according to the departmental dose-optimization program which includes automated exposure control, adjustment of the mA and/or kV according to patient size and/or use of iterative reconstruction technique. CONTRAST:  157mL OMNIPAQUE IOHEXOL 300 MG/ML  SOLN COMPARISON:  None. FINDINGS: Lower chest: Centrilobular emphsyema noted. Collapse/consolidative opacity noted left lower lobe with moderate left pleural effusion. Hepatobiliary: No suspicious focal abnormality within the liver parenchyma. There is no evidence for gallstones, gallbladder wall thickening, or pericholecystic fluid. No intrahepatic or extrahepatic biliary dilation. Pancreas: No focal mass lesion. No dilatation of the main duct. No intraparenchymal cyst. No peripancreatic edema. Spleen: No splenomegaly. No focal mass lesion. Adrenals/Urinary Tract: No adrenal nodule or mass. Precontrast imaging shows no stones in the right kidney. Punctate 1 mm nonobstructing stone noted lower pole left kidney (image 40/series 2). No ureteral or  bladder stones. Imaging After IV contrast administration shows no suspicious enhancing mass lesion in either kidney. Bilateral renal cysts evident, measuring up to 3.7 cm in the right kidney and 3.2 cm in the left kidney. Delayed post-contrast imaging shows no wall thickening or soft tissue filling defect in either intrarenal collecting system or renal pelvis. Neither ureter completely opacified but there is no evidence for focal hydroureter, ureteral wall thickening, or mass lesion along the length of either ureter. Bladder wall is irregular and trabeculated with multiple bladder diverticuli evident. Gas in the bladder lumen likely secondary to the presence of a Foley catheter. Stomach/Bowel: Stomach is unremarkable. No gastric wall thickening. No evidence of outlet obstruction. Duodenum is normally positioned as is the ligament of Treitz. No small bowel wall thickening. No small bowel dilatation. The terminal ileum is normal. The appendix is normal. Diverticuli are seen scattered along the entire length of the colon without CT findings of diverticulitis. Vascular/Lymphatic: There is moderate atherosclerotic calcification of the abdominal aorta without aneurysm. There is no gastrohepatic or hepatoduodenal ligament lymphadenopathy. No retroperitoneal or mesenteric lymphadenopathy. No pelvic sidewall lymphadenopathy. Reproductive: Prostate gland is markedly enlarged. Other: No intraperitoneal free fluid. Musculoskeletal: No worrisome lytic or sclerotic osseous abnormality. Bilateral pars interarticularis defects noted at L5. Mild anterolisthesis of L5 on S1 evident. IMPRESSION: 1. Collapse/consolidative opacity left lower lobe with moderate left pleural effusion. 2. Pneumonia not excluded.Punctate 1 mm nonobstructing stone lower pole left kidney. 3. Bilateral renal cysts. No suspicious enhancing mass lesion identified in either kidney. No urothelial abnormality. 4. Bladder wall trabeculation with multiple bladder  diverticuli suggests underlying component of bladder outlet obstruction. 5. Marked prostatomegaly. 6. Bilateral pars interarticularis defects at L5 with mild anterolisthesis of L5 on S1. 7. Aortic Atherosclerosis (ICD10-I70.0) and Emphysema (ICD10-J43.9). Electronically Signed   By: Misty Stanley M.D.   On: 02/05/2021 15:10   CT IMAGE GUIDED DRAINAGE BY PERCUTANEOUS CATHETER  Result Date: 02/20/2021 INDICATION: 88 year old gentleman with BPH and bladder outlet obstruction presents to IR for suprapubic catheter placement. EXAM: CT-guided suprapubic catheter placement COMPARISON:  None. MEDICATIONS: None ANESTHESIA/SEDATION: Moderate (conscious) sedation was employed during this procedure. A total of Versed 1 mg and Fentanyl 50 mcg was administered intravenously by the radiology nurse. Total intra-service moderate Sedation Time: 12 minutes. The patient's level of consciousness and vital signs were monitored continuously by radiology nursing throughout the procedure under my direct supervision. CONTRAST:  None COMPLICATIONS: None immediate. PROCEDURE: Informed  written consent was obtained from the patient after a thorough discussion of the procedural risks, benefits and alternatives. All questions were addressed. Maximal Sterile Barrier Technique was utilized including caps, mask, sterile gowns, sterile gloves, sterile drape, hand hygiene and skin antiseptic. A timeout was performed prior to the initiation of the procedure. Patient positioned supine on the procedure table. The anterior pelvic wall skin prepped and draped in usual fashion. Following local administration, the bladder was accessed with a 19 gauge Yueh needle utilizing CT guidance. Urine return was noted. Guidewire was advanced through the Yueh needle without difficulty. Serial dilation was performed over the guidewire and 16 [place] multipurpose pigtail drain was inserted. Post insertion CT showed that the drain had formed external to the bladder. The  drain was removed. The bladder was further dilated by administering 100 mL of normal saline through the Foley catheter. Utilizing CT guidance the anterior bladder was again accessed with an 18 gauge trocar needle. 0.035 inch guidewire was looped within the bladder, confirmed with CT. Serial dilation was performed and 16 [place] multipurpose pigtail drain was inserted. The final position of the drain was confirmed with CT. IMPRESSION: Successful CT-guided insertion of 16 French suprapubic catheter. Electronically Signed   By: Miachel Roux M.D.   On: 02/20/2021 15:50   (Echo, Carotid, EGD, Colonoscopy, ERCP)    Subjective: Pt denies any pain.    Discharge Exam: Vitals:   03/04/21 0554 03/04/21 0745  BP: (!) 145/71 (!) 145/95  Pulse: 72 70  Resp: 20 16  Temp: 97.6 F (36.4 C) (!) 97.5 F (36.4 C)  SpO2: 98% 92%   Vitals:   03/03/21 1636 03/03/21 2046 03/04/21 0554 03/04/21 0745  BP: (!) 107/56 (!) 116/59 (!) 145/71 (!) 145/95  Pulse: 71 72 72 70  Resp: 16 16 20 16   Temp: 97.8 F (36.6 C) 98.7 F (37.1 C) 97.6 F (36.4 C) (!) 97.5 F (36.4 C)  TempSrc:      SpO2: 97% 96% 98% 92%  Weight:      Height:        General: Pt is alert, awake, not in acute distress Cardiovascular: S1/S2 +, no rubs, no gallops Respiratory: CTA bilaterally, no wheezing, no rhonchi Abdominal: Soft, NT, ND, bowel sounds + Extremities: no edema, no cyanosis    The results of significant diagnostics from this hospitalization (including imaging, microbiology, ancillary and laboratory) are listed below for reference.     Microbiology: Recent Results (from the past 240 hour(s))  Urine Culture     Status: Abnormal   Collection Time: 02/25/21 11:56 AM   Specimen: Urine, Suprapubic  Result Value Ref Range Status   Specimen Description   Final    URINE, SUPRAPUBIC Performed at [facility], [address], [place], [place] [address]    Special Requests   Final    NONE Performed at [facility], [address] [place], [place] [address]    Culture MULTIPLE SPECIES PRESENT, SUGGEST RECOLLECTION (A)  Final   Report Status 02/27/2021 FINAL  Final  Resp Panel by RT-PCR (Flu A&B, Covid) Nasopharyngeal Swab     Status: None   Collection Time: 02/25/21  2:30 PM   Specimen: Nasopharyngeal Swab; Nasopharyngeal(NP) swabs in vial transport medium  Result Value Ref Range Status   SARS Coronavirus 2 by RT PCR NEGATIVE NEGATIVE Final    Comment: (NOTE) SARS-CoV-2 target nucleic acids are NOT DETECTED.  The SARS-CoV-2 RNA is generally detectable in upper respiratory specimens during the acute  phase of infection. The lowest concentration of SARS-CoV-2 viral copies this assay can detect is 138 copies/mL. A negative result does not preclude SARS-Cov-2 infection and should not be used as the sole basis for treatment or other patient management decisions. A negative result may occur with  improper specimen collection/handling, submission of specimen other than nasopharyngeal swab, presence of viral mutation(s) within the areas targeted by this assay, and inadequate number of viral copies(<138 copies/mL). A negative result must be combined with clinical observations, patient history, and epidemiological information. The expected result is Negative.  Fact Sheet for Patients:  EntrepreneurPulse.com.au  Fact Sheet for Healthcare Providers:  IncredibleEmployment.be  This test is no t yet approved or cleared by the [place] FDA and  has been authorized for detection and/or diagnosis of SARS-CoV-2 by FDA under an Emergency Use Authorization (EUA). This EUA will remain  in effect (meaning this test can be used) for the duration of the COVID-19 declaration under Section 564(b)(1) of the Act, 21 U.S.C.section 360bbb-3(b)(1), unless the authorization is terminated  or revoked sooner.       Influenza A by PCR NEGATIVE NEGATIVE Final    Influenza B by PCR NEGATIVE NEGATIVE Final    Comment: (NOTE) The Xpert Xpress SARS-CoV-2/FLU/RSV plus assay is intended as an aid in the diagnosis of influenza from Nasopharyngeal swab specimens and should not be used as a sole basis for treatment. Nasal washings and aspirates are unacceptable for Xpert Xpress SARS-CoV-2/FLU/RSV testing.  Fact Sheet for Patients: EntrepreneurPulse.com.au  Fact Sheet for Healthcare Providers: IncredibleEmployment.be  This test is not yet approved or cleared by the [place] FDA and has been authorized for detection and/or diagnosis of SARS-CoV-2 by FDA under an Emergency Use Authorization (EUA). This EUA will remain in effect (meaning this test can be used) for the duration of the COVID-19 declaration under Section 564(b)(1) of the Act, 21 U.S.C. section 360bbb-3(b)(1), unless the authorization is terminated or revoked.  Performed at [facility] [facility], [address] [place], [place] [address]      Labs: BNP (last 3 results) Recent Labs    01/16/21 1727  BNP 875.6*   Basic Metabolic Panel: Recent Labs  Lab 02/28/21 0317 03/01/21 0349 03/02/21 0649 03/03/21 0530 03/04/21 0427  NA 128* 130* 132* 134* 133*  K 4.6 5.2* 5.2* 4.6 4.4  CL 98 98 102 103 102  CO2 23 26 26 23 24   GLUCOSE 119* 96 102* 104* 106*  BUN 41* 37* 32* 38* 41*  CREATININE 1.53* 1.41* 1.39* 1.29* 1.41*  CALCIUM 8.6* 8.5* 8.6* 8.8* 8.6*   Liver Function Tests: Recent Labs  Lab 02/25/21 1215  AST 23  ALT 12  ALKPHOS 56  BILITOT 0.9  PROT 5.6*  ALBUMIN 3.3*   No results for input(s): LIPASE, AMYLASE in the last 168 hours. No results for input(s): AMMONIA in the last 168 hours. CBC: Recent Labs  Lab 02/25/21 1215 02/27/21 0354 02/28/21 0317 03/01/21 0349 03/02/21 0649 03/03/21 0530 03/04/21 0427  WBC 6.3  --   --  6.4 6.8 9.2 6.9  HGB 10.9*   < > 10.7* 10.2* 10.7* 11.0* 10.4*  HCT 30.6*  --   --  29.6*  31.5* 32.9* 30.7*  MCV 82.3  --   --  82.5 82.7 83.5 84.6  PLT 198  --   --  212 192 212 192   < > = values in this interval not displayed.   Cardiac Enzymes: No results for input(s): CKTOTAL, CKMB, CKMBINDEX, TROPONINI  in the last 168 hours. BNP: Invalid input(s): POCBNP CBG: No results for input(s): GLUCAP in the last 168 hours. D-Dimer No results for input(s): DDIMER in the last 72 hours. Hgb A1c No results for input(s): HGBA1C in the last 72 hours. Lipid Profile No results for input(s): CHOL, HDL, LDLCALC, TRIG, CHOLHDL, LDLDIRECT in the last 72 hours. Thyroid function studies No results for input(s): TSH, T4TOTAL, T3FREE, THYROIDAB in the last 72 hours.  Invalid input(s): FREET3 Anemia work up No results for input(s): VITAMINB12, FOLATE, FERRITIN, TIBC, IRON, RETICCTPCT in the last 72 hours. Urinalysis    Component Value Date/Time   COLORURINE YELLOW 02/25/2021 1156   APPEARANCEUR CLOUDY (A) 02/25/2021 1156   LABSPEC 1.020 02/25/2021 1156   PHURINE 6.0 02/25/2021 1156   GLUCOSEU NEGATIVE 02/25/2021 1156   GLUCOSEU NEGATIVE 12/25/2020 1556   HGBUR LARGE (A) 02/25/2021 1156   BILIRUBINUR NEGATIVE 02/25/2021 1156   BILIRUBINUR negative 07/29/2017 1005   KETONESUR NEGATIVE 02/25/2021 1156   PROTEINUR >300 (A) 02/25/2021 1156   UROBILINOGEN 0.2 12/25/2020 1556   NITRITE NEGATIVE 02/25/2021 1156   LEUKOCYTESUR MODERATE (A) 02/25/2021 1156   Sepsis Labs Invalid input(s): PROCALCITONIN,  WBC,  LACTICIDVEN Microbiology Recent Results (from the past 240 hour(s))  Urine Culture     Status: Abnormal   Collection Time: 02/25/21 11:56 AM   Specimen: Urine, Suprapubic  Result Value Ref Range Status   Specimen Description   Final    URINE, SUPRAPUBIC Performed at [facility], [address] [place], [place] [address]    Special Requests   Final    NONE Performed at [facility], [address] [place], [place] [address]    Culture MULTIPLE SPECIES  PRESENT, SUGGEST RECOLLECTION (A)  Final   Report Status 02/27/2021 FINAL  Final  Resp Panel by RT-PCR (Flu A&B, Covid) Nasopharyngeal Swab     Status: None   Collection Time: 02/25/21  2:30 PM   Specimen: Nasopharyngeal Swab; Nasopharyngeal(NP) swabs in vial transport medium  Result Value Ref Range Status   SARS Coronavirus 2 by RT PCR NEGATIVE NEGATIVE Final    Comment: (NOTE) SARS-CoV-2 target nucleic acids are NOT DETECTED.  The SARS-CoV-2 RNA is generally detectable in upper respiratory specimens during the acute phase of infection. The lowest concentration of SARS-CoV-2 viral copies this assay can detect is 138 copies/mL. A negative result does not preclude SARS-Cov-2 infection and should not be used as the sole basis for treatment or other patient management decisions. A negative result may occur with  improper specimen collection/handling, submission of specimen other than nasopharyngeal swab, presence of viral mutation(s) within the areas targeted by this assay, and inadequate number of viral copies(<138 copies/mL). A negative result must be combined with clinical observations, patient history, and epidemiological information. The expected result is Negative.  Fact Sheet for Patients:  EntrepreneurPulse.com.au  Fact Sheet for Healthcare Providers:  IncredibleEmployment.be  This test is no t yet approved or cleared by the [place] FDA and  has been authorized for detection and/or diagnosis of SARS-CoV-2 by FDA under an Emergency Use Authorization (EUA). This EUA will remain  in effect (meaning this test can be used) for the duration of the COVID-19 declaration under Section 564(b)(1) of the Act, 21 U.S.C.section 360bbb-3(b)(1), unless the authorization is terminated  or revoked sooner.       Influenza A by PCR NEGATIVE NEGATIVE Final   Influenza B by PCR NEGATIVE NEGATIVE Final    Comment: (NOTE) The Xpert Xpress  SARS-CoV-2/FLU/RSV  plus assay is intended as an aid in the diagnosis of influenza from Nasopharyngeal swab specimens and should not be used as a sole basis for treatment. Nasal washings and aspirates are unacceptable for Xpert Xpress SARS-CoV-2/FLU/RSV testing.  Fact Sheet for Patients: EntrepreneurPulse.com.au  Fact Sheet for Healthcare Providers: IncredibleEmployment.be  This test is not yet approved or cleared by the [place] FDA and has been authorized for detection and/or diagnosis of SARS-CoV-2 by FDA under an Emergency Use Authorization (EUA). This EUA will remain in effect (meaning this test can be used) for the duration of the COVID-19 declaration under Section 564(b)(1) of the Act, 21 U.S.C. section 360bbb-3(b)(1), unless the authorization is terminated or revoked.  Performed at [facility], [address] [place], [place] [address]      Time coordinating discharge: Over 30 minutes  SIGNED:   Wyvonnia Dusky, MD  [facility] 03/04/2021, 10:53 AM Pager   If 7PM-7AM, please contact night-coverage

## 2021-03-06 ENCOUNTER — Telehealth: Payer: Self-pay

## 2021-03-06 NOTE — Telephone Encounter (Signed)
Initial CCM appt scheduled for 01/29/2021 with Charlene Brooke was cancelled by patient. We may reach out to see if he is still interested.  Canceled: CCM Consent 11/20/2020 1:27 PM Appt cancelled 01/24/2021 5:00 PM

## 2021-03-07 ENCOUNTER — Telehealth: Payer: Self-pay | Admitting: Family Medicine

## 2021-03-07 NOTE — Telephone Encounter (Signed)
Spoke to patient's wife and she states he is doing much better after having the SPT tube change in the hospital. An appointment has been made for exchange in office.

## 2021-03-07 NOTE — Telephone Encounter (Signed)
LMOM for patient to return call. Need to schedule a SPT exchange in office.

## 2021-03-12 ENCOUNTER — Ambulatory Visit (INDEPENDENT_AMBULATORY_CARE_PROVIDER_SITE_OTHER): Payer: Medicare Other | Admitting: Family Medicine

## 2021-03-12 ENCOUNTER — Other Ambulatory Visit: Payer: Self-pay

## 2021-03-12 DIAGNOSIS — R339 Retention of urine, unspecified: Secondary | ICD-10-CM

## 2021-03-12 NOTE — Progress Notes (Signed)
Patient presents today concerned that his SPT is not draining. There was urine in the bag and the catheter was in place. I adjusted the leg strap and informed him and his wife that the catheter needs to be loose. If the catheter has to much tension it will cause pain and the balloon in the catheter can block the catheter from draining. Patient felt better about the catheter and will keep the follow up appointment for his first SPT change.

## 2021-03-15 NOTE — Progress Notes (Signed)
Patient ID: Shawn Andrews, male    DOB: 1928/08/30, 88 y.o.   MRN: [id number]  HPI  Shawn Andrews is a 88 y/o male with a history of CAD, hyperlipidemia, HTN, BPH, osteoarthritis, previous tobacco use and chronic heart failure.   Echo report from 11/14/20 reviewed and showed an EF of 40% along with moderate LAE and moderate Shawn/TR.   LHC done 01/18/21 and showed: Dist RCA lesion is 80% stenosed.   Prox RCA lesion is 50% stenosed.   Ost Cx to Prox Cx lesion is 55% stenosed.   Mid Cx lesion is 50% stenosed.   RPDA lesion is 40% stenosed.   Ost LAD to Prox LAD lesion is 40% stenosed.   Prox LAD to Mid LAD lesion is 10% stenosed.   Mid LAD to Dist LAD lesion is 45% stenosed. Patent stent to proximal to mid LAD with other diffuse disease in distal LAD and diagonal 2 Mild diffuse disease of circumflex obtuse marginal Moderate to severe disease with calcification of proximal and distal right coronary artery  Admitted 02/25/21 due to abdominal pain. Had been instructed to increase his fluid intake. Was hyponatremic with a sodium of 125. Urology consult obtained. Suprapubic catheter exchanged. Antibiotics given. Discharged after 7 days. Admitted 01/17/21 due to chest pain/ shortness of breath. Chest CT obtained. Placed on IV heparin drip. LHC done. Cardiology consult obtained. Initially given IV lasix with transition to oral diuretics. Discharged after 2 days.   Shawn Andrews presents today with a chief complaint of a follow-up visit. Shawn Andrews currently has no complaints and specifically denies any difficulty sleeping, dizziness, abdominal distention, palpitations, pedal edema, chest pain, shortness of breath, cough, fatigue or weight gain.   Currently has a suprapubic catheter in place and adjusted well to is. Says that Shawn Andrews's not really interested in any further urology procedure to remove the catheter.     Past Medical History:  Diagnosis Date   Abnormal drug screen 06/22/2014   inapprop neg xanax rpt 3 mo (06/2014)    BPH (benign prostatic hypertrophy) 01/21/1998   has had 3 biopsies in past ([facility]) decided to stop PSA/DRE   CHF (congestive heart failure) (HCC)    Coronary artery disease    Hyperlipidemia 01/21/2002   Hypertension 05/22/2003   Left lumbar radiculopathy    Osteoarthritis 01/22/1988   knees, lumbar spondylosis and listhesis   Past Surgical History:  Procedure Laterality Date   CATARACT EXTRACTION  2013   bilateral   ESI Left 08/2014, 11/2014, 08/2015   L S1, L L5/S1 transforaminal ESI; L4/5 L5/S1 zygapophysial injections, L S1 transforaminal ([facility])   FINGER SURGERY     right middle, partial traumatic amputation   IR CATHETER TUBE CHANGE  03/02/2021   KNEE ARTHROSCOPY  1990   right   KNEE SURGERY  04/2006   right partial knee replacement in [place] - rec ppx abx for any invasive procedure   LEFT HEART CATH AND CORONARY ANGIOGRAPHY N/A 01/18/2021   Procedure: LEFT HEART CATH AND CORONARY ANGIOGRAPHY;  Surgeon: Corey Skains, MD;  Location: [facility];  Service: Cardiovascular;  Laterality: N/A;   Family History  Problem Relation Age of Onset   Hypertension Mother    Heart disease Brother 36       MI   Diabetes Brother    Cancer Daughter        melanoma   Stroke Neg Hx    Social History   Tobacco Use   Smoking status: Former  Packs/day: 0.00    Years: 0.00    Pack years: 0.00    Types: Pipe, Cigarettes    Quit date: 12/26/2000    Years since quitting: 20.2   Smokeless tobacco: Current    Types: Chew   Tobacco comments:    quit over 30 years ago  Substance Use Topics   Alcohol use: Yes    Alcohol/week: 0.0 standard drinks    Comment: Occasional beer   Allergies  Allergen Reactions   Penicillins Swelling    Of tongue Has patient had a PCN reaction causing immediate rash, facial/tongue/throat swelling, SOB or lightheadedness with hypotension: Yes Has patient had a PCN reaction causing severe rash involving mucus membranes or skin necrosis:  No Has patient had a PCN reaction that required hospitalization: No Has patient had a PCN reaction occurring within the last 10 years: No If all of the above answers are "NO", then may proceed with Cephalosporin use.   Cialis [Tadalafil] Other (See Comments)    Indigestion and "felt like crap"   Lipitor [Atorvastatin] Other (See Comments)    Severe myalgias   Lisinopril Cough    Dry nagging cough   Prior to Admission medications   Medication Sig Start Date End Date Taking? Authorizing Provider  acetaminophen (TYLENOL 8 HOUR) 650 MG CR tablet Take 1 tablet (650 mg total) by mouth 2 (two) times a day. 06/24/18  Yes Ria Bush, MD  Cholecalciferol (VITAMIN D3) 25 MCG (1000 UT) CAPS Take 1 capsule (1,000 Units total) by mouth daily. 08/25/18  Yes Ria Bush, MD  clopidogrel (PLAVIX) 75 MG tablet Take 1 tablet by mouth daily. 10/10/20  Yes [provider]  cyanocobalamin 1000 MCG tablet Take 1,000 mcg by mouth daily.   Yes [provider]  docusate sodium (COLACE) 100 MG capsule Take 1 capsule (100 mg total) by mouth daily. 12/13/20  Yes Ria Bush, MD  doxazosin (CARDURA) 4 MG tablet TAKE 1 TABLET BY MOUTH  DAILY 11/17/19  Yes Ria Bush, MD  famotidine (PEPCID) 20 MG tablet TAKE 1 TABLET BY MOUTH AT  BEDTIME 01/23/21  Yes Ria Bush, MD  finasteride (PROSCAR) 5 MG tablet TAKE 1 TABLET BY MOUTH  DAILY 11/17/19  Yes Ria Bush, MD  furosemide (LASIX) 20 MG tablet Take 1 tablet by mouth every other day. 10/09/20  Yes [provider]  isosorbide mononitrate (IMDUR) 30 MG 24 hr tablet Take 1 tablet by mouth daily. 10/10/20  Yes [provider]  loratadine (CLARITIN) 10 MG tablet Take 1 tablet (10 mg total) by mouth daily as needed for allergies. 12/22/12  Yes Ria Bush, MD  losartan (COZAAR) 50 MG tablet Take 25 mg by mouth daily. 12/13/20  Yes Ria Bush, MD  metoprolol tartrate (LOPRESSOR) 50 MG tablet Take 0.5  tablets (25 mg total) by mouth 2 (two) times daily. 10/16/20  Yes Ria Bush, MD  nitroGLYCERIN (NITROSTAT) 0.4 MG SL tablet Place under the tongue. 10/09/20  Yes [provider]  polyethylene glycol powder (GLYCOLAX/MIRALAX) 17 GM/SCOOP powder Take 8.5-17 g by mouth daily as needed for moderate constipation. 12/13/20  Yes Ria Bush, MD  primidone (MYSOLINE) 50 MG tablet Take 2 tablets (100 mg total) by mouth 3 (three) times daily. 10/16/20  Yes Ria Bush, MD  rosuvastatin (CRESTOR) 5 MG tablet Take 5 mg by mouth daily. 12/21/20 12/21/21 Yes [provider]  trospium (SANCTURA) 20 MG tablet Take 1 tablet (20 mg total) by mouth 2 (two) times daily. 03/04/21 04/03/21 Yes Eppie Gibson  M, MD  amLODipine (NORVASC) 2.5 MG tablet Take 1 tablet (2.5 mg total) by mouth daily. Patient not taking: Reported on 03/16/2021 01/20/21   Nita Sells, MD  atorvastatin (LIPITOR) 40 MG tablet Take 40 mg by mouth daily. Patient not taking: Reported on 03/16/2021 02/20/21   [provider]  Coenzyme Q10 (CO Q-10) 100 MG CAPS Take 1 capsule by mouth daily. Patient not taking: Reported on 02/25/2021 12/21/20   Ria Bush, MD  vitamin B-12 (CYANOCOBALAMIN) 500 MCG tablet Take 1 tablet (500 mcg total) by mouth once a week. 08/30/20   Ria Bush, MD    Review of Systems  Constitutional:  Negative for appetite change and fatigue.  HENT:  Negative for congestion, postnasal drip and sore throat.   Eyes: Negative.   Respiratory:  Negative for cough, chest tightness and shortness of breath.   Cardiovascular:  Negative for chest pain, palpitations and leg swelling.  Gastrointestinal:  Negative for abdominal distention and abdominal pain.  Endocrine: Negative.   Genitourinary:        Foley catheter in place  Musculoskeletal:  Negative for back pain and neck pain.  Allergic/Immunologic: Negative.   Neurological:  Negative for dizziness and light-headedness.   Hematological:  Negative for adenopathy. Does not bruise/bleed easily.  Psychiatric/Behavioral:  Negative for dysphoric mood and sleep disturbance (sleeping on 1 pillow). The patient is not nervous/anxious.    Vitals:   03/16/21 1000  BP: (!) 100/55  Pulse: 79  Resp: 14  SpO2: 99%  Weight: 177 lb 8 oz (80.5 kg)  Height: 5\' 10"  (1.778 m)   Wt Readings from Last 3 Encounters:  03/16/21 177 lb 8 oz (80.5 kg)  02/25/21 171 lb 15.3 oz (78 kg)  02/20/21 173 lb (78.5 kg)   Lab Results  Component Value Date   CREATININE 1.41 (H) 03/04/2021   CREATININE 1.29 (H) 03/03/2021   CREATININE 1.39 (H) 03/02/2021   Physical Exam Vitals and nursing note reviewed. Exam conducted with a chaperone present (wife).  Constitutional:      Appearance: Normal appearance.  HENT:     Head: Normocephalic and atraumatic.  Cardiovascular:     Rate and Rhythm: Normal rate and regular rhythm.  Pulmonary:     Effort: Pulmonary effort is normal. No respiratory distress.     Breath sounds: No wheezing or rales.  Abdominal:     General: There is no distension.     Palpations: Abdomen is soft.  Musculoskeletal:        General: No tenderness.     Cervical back: Normal range of motion and neck supple.     Right lower leg: No edema.     Left lower leg: No edema.  Skin:    General: Skin is warm and dry.  Neurological:     General: No focal deficit present.     Mental Status: Shawn Andrews is alert and oriented to person, place, and time.  Psychiatric:        Mood and Affect: Mood normal.        Behavior: Behavior normal.        Thought Content: Thought content normal.   Assessment & Plan:  1: Chronic heart failure with reduced ejection fraction- - NYHA class I - euvolemic today - weighing daily; reminded to call for an overnight weight gain of > 2 pounds or a weekly weight gain of >5 pounds - weight up 2 pounds from last visit here 6 weeks ago - not adding salt and wife  is reading food labels for sodium  content and adjusting meals based on sodium content - drinking ~ 64 ounces daily - on GDMT of losartan and metoprolol (although tartrate) - cough with lisinopril - current BP does not allow for entresto or SGLT2 - BNP 01/16/21 was 793.0  2: HTN- - BP soft today (100/55) - wife reports it was 87/49 earlier this week and cardiology decreased losartan and are holding amlodipine; today is his first day without the amlodipine - saw PCP Ola Spurr) 03/07/21; return 04/04/21 - BMP 03/04/21 reviewed and showed sodium 133, potassium 4.4, creatinine 1.41 & GFR 47  3: CAD- - saw cardiology Nehemiah Massed) 01/29/21; returns 04/10/21 - on clopidogrel  4: BPH- - has suprapubic catheter in place - saw urology Erlene Quan) 01/24/21; returns 04/05/21 - isn't interested in having his prostate bored as Shawn Andrews's adjusted well to the SP catheter   Medication list reviewed.   Due to his numerous other providers and HF stability will not make a return appointment for patient at this time. Advised patient to continue close f/u with his providers and that Shawn Andrews could call us back at anytime to make another appointment and they were comfortable with this plan.    Medication list reviewed.

## 2021-03-16 ENCOUNTER — Other Ambulatory Visit: Payer: Self-pay

## 2021-03-16 ENCOUNTER — Ambulatory Visit: Payer: Medicare Other | Attending: Family | Admitting: Family

## 2021-03-16 ENCOUNTER — Encounter: Payer: Self-pay | Admitting: Family

## 2021-03-16 VITALS — BP 100/55 | HR 79 | Resp 14 | Ht 70.0 in | Wt 177.5 lb

## 2021-03-16 DIAGNOSIS — Z955 Presence of coronary angioplasty implant and graft: Secondary | ICD-10-CM | POA: Diagnosis not present

## 2021-03-16 DIAGNOSIS — E871 Hypo-osmolality and hyponatremia: Secondary | ICD-10-CM | POA: Insufficient documentation

## 2021-03-16 DIAGNOSIS — R338 Other retention of urine: Secondary | ICD-10-CM

## 2021-03-16 DIAGNOSIS — I5022 Chronic systolic (congestive) heart failure: Secondary | ICD-10-CM | POA: Insufficient documentation

## 2021-03-16 DIAGNOSIS — M1990 Unspecified osteoarthritis, unspecified site: Secondary | ICD-10-CM | POA: Insufficient documentation

## 2021-03-16 DIAGNOSIS — E785 Hyperlipidemia, unspecified: Secondary | ICD-10-CM | POA: Insufficient documentation

## 2021-03-16 DIAGNOSIS — I110 Hypertensive heart disease with heart failure: Secondary | ICD-10-CM | POA: Insufficient documentation

## 2021-03-16 DIAGNOSIS — N401 Enlarged prostate with lower urinary tract symptoms: Secondary | ICD-10-CM | POA: Diagnosis not present

## 2021-03-16 DIAGNOSIS — N400 Enlarged prostate without lower urinary tract symptoms: Secondary | ICD-10-CM | POA: Diagnosis not present

## 2021-03-16 DIAGNOSIS — Z79899 Other long term (current) drug therapy: Secondary | ICD-10-CM | POA: Diagnosis not present

## 2021-03-16 DIAGNOSIS — Z87891 Personal history of nicotine dependence: Secondary | ICD-10-CM | POA: Insufficient documentation

## 2021-03-16 DIAGNOSIS — I10 Essential (primary) hypertension: Secondary | ICD-10-CM

## 2021-03-16 DIAGNOSIS — I2510 Atherosclerotic heart disease of native coronary artery without angina pectoris: Secondary | ICD-10-CM | POA: Diagnosis not present

## 2021-03-16 NOTE — Patient Instructions (Addendum)
Continue weighing daily and call for an overnight weight gain of 3 pounds or more or a weekly weight gain of more than 5 pounds.  ° ° ° °Call us in the future if you need us for anything °

## 2021-03-21 ENCOUNTER — Encounter: Payer: Self-pay | Admitting: Urology

## 2021-03-21 ENCOUNTER — Ambulatory Visit: Payer: Medicare Other | Admitting: Urology

## 2021-03-21 ENCOUNTER — Encounter: Payer: Medicare Other | Attending: Internal Medicine | Admitting: *Deleted

## 2021-03-21 ENCOUNTER — Other Ambulatory Visit: Payer: Self-pay

## 2021-03-21 DIAGNOSIS — I252 Old myocardial infarction: Secondary | ICD-10-CM | POA: Insufficient documentation

## 2021-03-21 DIAGNOSIS — Z955 Presence of coronary angioplasty implant and graft: Secondary | ICD-10-CM

## 2021-03-21 DIAGNOSIS — Z952 Presence of prosthetic heart valve: Secondary | ICD-10-CM | POA: Insufficient documentation

## 2021-03-21 DIAGNOSIS — I214 Non-ST elevation (NSTEMI) myocardial infarction: Secondary | ICD-10-CM

## 2021-03-21 NOTE — Telephone Encounter (Signed)
Spoke with patients wife. He had some mild drainage and discomfort. Advised pt wife the discomfort should subside soon. The patient denies any fever, chills. The tube is draining into the bag. Pt wife verbalized understanding and will keep follow up, pt aware if symptoms worsen to contact clinic. ?

## 2021-03-21 NOTE — Progress Notes (Signed)
Initial telephone orientation completed. Diagnosis can be found in [facility] 9/17. EP orientation scheduled for Tuesday 3/14 at 8am. ?

## 2021-04-03 ENCOUNTER — Other Ambulatory Visit: Payer: Self-pay

## 2021-04-03 VITALS — Ht 70.5 in | Wt 178.0 lb

## 2021-04-03 DIAGNOSIS — I252 Old myocardial infarction: Secondary | ICD-10-CM | POA: Diagnosis present

## 2021-04-03 DIAGNOSIS — Z952 Presence of prosthetic heart valve: Secondary | ICD-10-CM | POA: Diagnosis not present

## 2021-04-03 DIAGNOSIS — I214 Non-ST elevation (NSTEMI) myocardial infarction: Secondary | ICD-10-CM

## 2021-04-03 DIAGNOSIS — Z955 Presence of coronary angioplasty implant and graft: Secondary | ICD-10-CM

## 2021-04-03 NOTE — Progress Notes (Signed)
Cardiac Individual Treatment Plan ? ?Patient Details  ?Name: Shawn Andrews ?MRN: [id number] ?Date of Birth: 01-21-1929 ?Referring Provider:   ?Flowsheet Row Cardiac Rehab from 04/03/2021 in [facility] Cardiac and Pulmonary Rehab  ?Referring Provider Serafina Royals MD  ? ?  ? ? ?Initial Encounter Date:  ?Flowsheet Row Cardiac Rehab from 04/03/2021 in [facility] Cardiac and Pulmonary Rehab  ?Date 04/03/21  ? ?  ? ? ?Visit Diagnosis: NSTEMI (non-ST elevated myocardial infarction) ([place]) ? ?Status post coronary artery stent placement ? ?Patient's Home Medications on Admission: ? ?Current Outpatient Medications:  ?  acetaminophen (TYLENOL 8 HOUR) 650 MG CR tablet, Take 1 tablet (650 mg total) by mouth 2 (two) times a day., Disp: , Rfl:  ?  amLODipine (NORVASC) 2.5 MG tablet, Take 1 tablet (2.5 mg total) by mouth daily. (Patient not taking: Reported on 03/16/2021), Disp: 30 tablet, Rfl: 12 ?  atorvastatin (LIPITOR) 40 MG tablet, Take 40 mg by mouth daily. (Patient not taking: Reported on 03/16/2021), Disp: , Rfl:  ?  Cholecalciferol (VITAMIN D3) 25 MCG (1000 UT) CAPS, Take 1 capsule (1,000 Units total) by mouth daily., Disp: 30 capsule, Rfl:  ?  clopidogrel (PLAVIX) 75 MG tablet, Take 1 tablet by mouth daily., Disp: , Rfl:  ?  Coenzyme Q10 (CO Q-10) 100 MG CAPS, Take 1 capsule by mouth daily. (Patient not taking: Reported on 02/25/2021), Disp: , Rfl: 0 ?  cyanocobalamin 1000 MCG tablet, Take 1,000 mcg by mouth daily., Disp: , Rfl:  ?  docusate sodium (COLACE) 100 MG capsule, Take 1 capsule (100 mg total) by mouth daily., Disp: , Rfl:  ?  doxazosin (CARDURA) 4 MG tablet, TAKE 1 TABLET BY MOUTH  DAILY, Disp: 90 tablet, Rfl: 3 ?  famotidine (PEPCID) 20 MG tablet, TAKE 1 TABLET BY MOUTH AT  BEDTIME, Disp: 90 tablet, Rfl: 3 ?  finasteride (PROSCAR) 5 MG tablet, TAKE 1 TABLET BY MOUTH  DAILY, Disp: 90 tablet, Rfl: 3 ?  furosemide (LASIX) 20 MG tablet, Take 1 tablet by mouth every other day., Disp: , Rfl:  ?  isosorbide mononitrate (IMDUR) 30 MG 24  hr tablet, Take 1 tablet by mouth daily., Disp: , Rfl:  ?  loratadine (CLARITIN) 10 MG tablet, Take 1 tablet (10 mg total) by mouth daily as needed for allergies., Disp: 30 tablet, Rfl:  ?  losartan (COZAAR) 50 MG tablet, Take 25 mg by mouth daily., Disp: , Rfl:  ?  metoprolol tartrate (LOPRESSOR) 50 MG tablet, Take 0.5 tablets (25 mg total) by mouth 2 (two) times daily., Disp: , Rfl:  ?  nitroGLYCERIN (NITROSTAT) 0.4 MG SL tablet, Place under the tongue., Disp: , Rfl:  ?  polyethylene glycol powder (GLYCOLAX/MIRALAX) 17 GM/SCOOP powder, Take 8.5-17 g by mouth daily as needed for moderate constipation., Disp: 3350 g, Rfl: 0 ?  primidone (MYSOLINE) 50 MG tablet, Take 2 tablets (100 mg total) by mouth 3 (three) times daily., Disp: , Rfl:  ?  rosuvastatin (CRESTOR) 5 MG tablet, Take 5 mg by mouth daily., Disp: , Rfl:  ?  trospium (SANCTURA) 20 MG tablet, Take 1 tablet (20 mg total) by mouth 2 (two) times daily., Disp: 60 tablet, Rfl: 0 ?  vitamin B-12 (CYANOCOBALAMIN) 500 MCG tablet, Take 1 tablet (500 mcg total) by mouth once a week., Disp: , Rfl:  ? ?Past Medical History: ?Past Medical History:  ?Diagnosis Date  ? Abnormal drug screen 06/22/2014  ? inapprop neg xanax rpt 3 mo (06/2014)  ? BPH (benign prostatic hypertrophy)  01/21/1998  ? has had 3 biopsies in past ([facility]) decided to stop PSA/DRE  ? CHF (congestive heart failure) ([place])   ? Coronary artery disease   ? Hyperlipidemia 01/21/2002  ? Hypertension 05/22/2003  ? Left lumbar radiculopathy   ? Osteoarthritis 01/22/1988  ? knees, lumbar spondylosis and listhesis  ? ? ?Tobacco Use: ?Social History  ? ?Tobacco Use  ?Smoking Status Former  ? Packs/day: 0.00  ? Years: 0.00  ? Pack years: 0.00  ? Types: Pipe, Cigarettes  ? Quit date: 12/26/2000  ? Years since quitting: 20.2  ?Smokeless Tobacco Current  ? Types: Chew  ?Tobacco Comments  ? quit over 30 years ago  ? ? ?Labs: ?Recent Review Flowsheet Data   ? ? Labs for ITP Cardiac and Pulmonary Rehab Latest Ref Rng &  Units 08/18/2018 08/25/2019 08/25/2020 01/17/2021 01/18/2021  ? Cholestrol 0 - 200 mg/dL 190 202(H) 212(H) - 157  ? LDLCALC 0 - 99 mg/dL 129(H) 134(H) 138(H) - 99  ? LDLDIRECT mg/dL - - - - -  ? HDL >40 mg/dL 43.50 45.80 49.80 - 45  ? Trlycerides <150 mg/dL 88.0 110.0 119.0 - 66  ? Hemoglobin A1c 4.8 - 5.6 % - - - 5.0 -  ? ?  ? ? ? ?Exercise Target Goals: ?Exercise Program Goal: ?Individual exercise prescription set using results from initial 6 min walk test and THRR while considering  patient?s activity barriers and safety.  ? ?Exercise Prescription Goal: ?Initial exercise prescription builds to 30-45 minutes a day of aerobic activity, 2-3 days per week.  Home exercise guidelines will be given to patient during program as part of exercise prescription that the participant will acknowledge. ? ? ?Education: Aerobic Exercise: ?- Group verbal and visual presentation on the components of exercise prescription. Introduces F.I.T.T principle from ACSM for exercise prescriptions.  Reviews F.I.T.T. principles of aerobic exercise including progression. Written material given at graduation. ?Flowsheet Row Cardiac Rehab from 04/03/2021 in [facility] Cardiac and Pulmonary Rehab  ?Education need identified 04/03/21  ? ?  ? ? ?Education: Resistance Exercise: ?- Group verbal and visual presentation on the components of exercise prescription. Introduces F.I.T.T principle from ACSM for exercise prescriptions  Reviews F.I.T.T. principles of resistance exercise including progression. Written material given at graduation. ? ?  ?Education: Exercise & Equipment Safety: ?- Individual verbal instruction and demonstration of equipment use and safety with use of the equipment. ?Flowsheet Row Cardiac Rehab from 04/03/2021 in [facility] Cardiac and Pulmonary Rehab  ?Education need identified 04/03/21  ?Date 04/03/21  ?Educator KL  ?Instruction Review Code 1- Verbalizes Understanding  ? ?  ? ? ?Education: Exercise Physiology & General Exercise Guidelines: ?- Group  verbal and written instruction with models to review the exercise physiology of the cardiovascular system and associated critical values. Provides general exercise guidelines with specific guidelines to those with heart or lung disease.  ?Flowsheet Row Cardiac Rehab from 04/03/2021 in [facility] Cardiac and Pulmonary Rehab  ?Education need identified 04/03/21  ? ?  ? ? ?Education: Flexibility, Balance, Mind/Body Relaxation: ?- Group verbal and visual presentation with interactive activity on the components of exercise prescription. Introduces F.I.T.T principle from ACSM for exercise prescriptions. Reviews F.I.T.T. principles of flexibility and balance exercise training including progression. Also discusses the mind body connection.  Reviews various relaxation techniques to help reduce and manage stress (i.e. Deep breathing, progressive muscle relaxation, and visualization). Balance handout provided to take home. Written material given at graduation. ? ? ?Activity Barriers & Risk Stratification: ? Activity Barriers &  Cardiac Risk Stratification - 04/03/21 0943   ? ?  ? Activity Barriers & Cardiac Risk Stratification  ? Activity Barriers Deconditioning;Decreased Ventricular Function   ? Comments Suprapubic catheter- reports no limitation or problems with movement or activity   ? Cardiac Risk Stratification High   ? ?  ?  ? ?  ? ? ?6 Minute Walk: ? 6 Minute Walk   ? ? [place] Name 04/03/21 0944  ?  ?  ?  ? 6 Minute Walk  ? Phase Initial    ? Distance 1040 feet    ? Walk Time 6 minutes    ? # of Rest Breaks 0    ? MPH 1.96    ? METS 1.41    ? RPE 9    ? Perceived Dyspnea  0    ? VO2 Peak 4.93    ? Symptoms No    ? Resting HR 65 bpm    ? Resting BP 102/56    ? Resting Oxygen Saturation  98 %    ? Exercise Oxygen Saturation  during 6 min walk 94 %    ? Max Ex. HR 84 bpm    ? Max Ex. BP 132/62    ? 2 Minute Post BP 100/58    ? ?  ?  ? ?  ? ? ?Oxygen Initial Assessment: ? ? ?Oxygen Re-Evaluation: ? ? ?Oxygen Discharge (Final Oxygen  Re-Evaluation): ? ? ?Initial Exercise Prescription: ? Initial Exercise Prescription - 04/03/21 1400   ? ?  ? Date of Initial Exercise RX and Referring Provider  ? Date 04/03/21   ? Referring Provider K

## 2021-04-03 NOTE — Progress Notes (Signed)
Suprapubic Cath Change ? ?Patient is present today for a suprapubic catheter change due to urinary retention.  8 ml of water was drained from the balloon, a 16 council tip FR foley cath was removed from the tract with out difficulty.  Site was cleaned and prepped in a sterile fashion with betadine.  A 16 FR foley cath was replaced into the tract no complications were noted. Urine return was noted, 10 ml of sterile water was inflated into the balloon and a leg bag was attached for drainage.  Patient tolerated well. A night bag was given to patient and proper instruction was given on how to switch bags.   ? ?Performed by: Zara Council, PA-C  ? ?Follow up: Return in about 1 month (around 05/06/2021) for SPT exchange . ? ? ?I,SHANNON MCGOWAN,acting as a scribe for [employer], PA-C.,have documented all relevant documentation on the behalf of [place], PA-C,as directed by  [facility], PA-C while in the presence of [place], PA-C. ? ?I have reviewed the above documentation for accuracy and completeness, and I agree with the above.   ? ?Zara Council, PA-C  ?

## 2021-04-05 ENCOUNTER — Ambulatory Visit (INDEPENDENT_AMBULATORY_CARE_PROVIDER_SITE_OTHER): Payer: Medicare Other | Admitting: Urology

## 2021-04-05 ENCOUNTER — Other Ambulatory Visit: Payer: Self-pay

## 2021-04-05 DIAGNOSIS — R339 Retention of urine, unspecified: Secondary | ICD-10-CM | POA: Diagnosis not present

## 2021-04-11 ENCOUNTER — Encounter: Payer: Self-pay | Admitting: *Deleted

## 2021-04-11 DIAGNOSIS — Z955 Presence of coronary angioplasty implant and graft: Secondary | ICD-10-CM

## 2021-04-11 DIAGNOSIS — I214 Non-ST elevation (NSTEMI) myocardial infarction: Secondary | ICD-10-CM

## 2021-04-11 NOTE — Progress Notes (Signed)
Cardiac Individual Treatment Plan ? ?Patient Details  ?Name: Shawn Andrews ?MRN: [id number] ?Date of Birth: September 21, 1928 ?Referring Provider:   ?Flowsheet Row Cardiac Rehab from 04/03/2021 in [facility] Cardiac and Pulmonary Rehab  ?Referring Provider Serafina Royals MD  ? ?  ? ? ?Initial Encounter Date:  ?Flowsheet Row Cardiac Rehab from 04/03/2021 in [facility] Cardiac and Pulmonary Rehab  ?Date 04/03/21  ? ?  ? ? ?Visit Diagnosis: NSTEMI (non-ST elevated myocardial infarction) ([place]) ? ?Status post coronary artery stent placement ? ?Patient's Home Medications on Admission: ? ?Current Outpatient Medications:  ?  acetaminophen (TYLENOL 8 HOUR) 650 MG CR tablet, Take 1 tablet (650 mg total) by mouth 2 (two) times a day., Disp: , Rfl:  ?  amLODipine (NORVASC) 2.5 MG tablet, Take 1 tablet (2.5 mg total) by mouth daily. (Patient not taking: Reported on 03/16/2021), Disp: 30 tablet, Rfl: 12 ?  atorvastatin (LIPITOR) 40 MG tablet, Take 40 mg by mouth daily. (Patient not taking: Reported on 03/16/2021), Disp: , Rfl:  ?  Cholecalciferol (VITAMIN D3) 25 MCG (1000 UT) CAPS, Take 1 capsule (1,000 Units total) by mouth daily., Disp: 30 capsule, Rfl:  ?  clopidogrel (PLAVIX) 75 MG tablet, Take 1 tablet by mouth daily., Disp: , Rfl:  ?  Coenzyme Q10 (CO Q-10) 100 MG CAPS, Take 1 capsule by mouth daily. (Patient not taking: Reported on 02/25/2021), Disp: , Rfl: 0 ?  cyanocobalamin 1000 MCG tablet, Take 1,000 mcg by mouth daily., Disp: , Rfl:  ?  docusate sodium (COLACE) 100 MG capsule, Take 1 capsule (100 mg total) by mouth daily., Disp: , Rfl:  ?  doxazosin (CARDURA) 4 MG tablet, TAKE 1 TABLET BY MOUTH  DAILY, Disp: 90 tablet, Rfl: 3 ?  famotidine (PEPCID) 20 MG tablet, TAKE 1 TABLET BY MOUTH AT  BEDTIME, Disp: 90 tablet, Rfl: 3 ?  finasteride (PROSCAR) 5 MG tablet, TAKE 1 TABLET BY MOUTH  DAILY, Disp: 90 tablet, Rfl: 3 ?  furosemide (LASIX) 20 MG tablet, Take 1 tablet by mouth every other day., Disp: , Rfl:  ?  isosorbide mononitrate (IMDUR) 30 MG 24  hr tablet, Take 1 tablet by mouth daily., Disp: , Rfl:  ?  loratadine (CLARITIN) 10 MG tablet, Take 1 tablet (10 mg total) by mouth daily as needed for allergies., Disp: 30 tablet, Rfl:  ?  losartan (COZAAR) 50 MG tablet, Take 25 mg by mouth daily., Disp: , Rfl:  ?  metoprolol tartrate (LOPRESSOR) 50 MG tablet, Take 0.5 tablets (25 mg total) by mouth 2 (two) times daily., Disp: , Rfl:  ?  nitroGLYCERIN (NITROSTAT) 0.4 MG SL tablet, Place under the tongue., Disp: , Rfl:  ?  polyethylene glycol powder (GLYCOLAX/MIRALAX) 17 GM/SCOOP powder, Take 8.5-17 g by mouth daily as needed for moderate constipation., Disp: 3350 g, Rfl: 0 ?  primidone (MYSOLINE) 50 MG tablet, Take 2 tablets (100 mg total) by mouth 3 (three) times daily., Disp: , Rfl:  ?  rosuvastatin (CRESTOR) 5 MG tablet, Take 5 mg by mouth daily., Disp: , Rfl:  ?  vitamin B-12 (CYANOCOBALAMIN) 500 MCG tablet, Take 1 tablet (500 mcg total) by mouth once a week., Disp: , Rfl:  ? ?Past Medical History: ?Past Medical History:  ?Diagnosis Date  ? Abnormal drug screen 06/22/2014  ? inapprop neg xanax rpt 3 mo (06/2014)  ? BPH (benign prostatic hypertrophy) 01/21/1998  ? has had 3 biopsies in past ([facility]) decided to stop PSA/DRE  ? CHF (congestive heart failure) ([place])   ?  Coronary artery disease   ? Hyperlipidemia 01/21/2002  ? Hypertension 05/22/2003  ? Left lumbar radiculopathy   ? Osteoarthritis 01/22/1988  ? knees, lumbar spondylosis and listhesis  ? ? ?Tobacco Use: ?Social History  ? ?Tobacco Use  ?Smoking Status Former  ? Packs/day: 0.00  ? Years: 0.00  ? Pack years: 0.00  ? Types: Pipe, Cigarettes  ? Quit date: 12/26/2000  ? Years since quitting: 20.3  ?Smokeless Tobacco Current  ? Types: Chew  ?Tobacco Comments  ? quit over 30 years ago  ? ? ?Labs: ?Recent Review Flowsheet Data   ? ? Labs for ITP Cardiac and Pulmonary Rehab Latest Ref Rng & Units 08/18/2018 08/25/2019 08/25/2020 01/17/2021 01/18/2021  ? Cholestrol 0 - 200 mg/dL 190 202(H) 212(H) - 157  ? LDLCALC 0  - 99 mg/dL 129(H) 134(H) 138(H) - 99  ? LDLDIRECT mg/dL - - - - -  ? HDL >40 mg/dL 43.50 45.80 49.80 - 45  ? Trlycerides <150 mg/dL 88.0 110.0 119.0 - 66  ? Hemoglobin A1c 4.8 - 5.6 % - - - 5.0 -  ? ?  ? ? ? ?Exercise Target Goals: ?Exercise Program Goal: ?Individual exercise prescription set using results from initial 6 min walk test and THRR while considering  patient?s activity barriers and safety.  ? ?Exercise Prescription Goal: ?Initial exercise prescription builds to 30-45 minutes a day of aerobic activity, 2-3 days per week.  Home exercise guidelines will be given to patient during program as part of exercise prescription that the participant will acknowledge. ? ? ?Education: Aerobic Exercise: ?- Group verbal and visual presentation on the components of exercise prescription. Introduces F.I.T.T principle from ACSM for exercise prescriptions.  Reviews F.I.T.T. principles of aerobic exercise including progression. Written material given at graduation. ?Flowsheet Row Cardiac Rehab from 04/03/2021 in [facility] Cardiac and Pulmonary Rehab  ?Education need identified 04/03/21  ? ?  ? ? ?Education: Resistance Exercise: ?- Group verbal and visual presentation on the components of exercise prescription. Introduces F.I.T.T principle from ACSM for exercise prescriptions  Reviews F.I.T.T. principles of resistance exercise including progression. Written material given at graduation. ? ?  ?Education: Exercise & Equipment Safety: ?- Individual verbal instruction and demonstration of equipment use and safety with use of the equipment. ?Flowsheet Row Cardiac Rehab from 04/03/2021 in [facility] Cardiac and Pulmonary Rehab  ?Education need identified 04/03/21  ?Date 04/03/21  ?Educator KL  ?Instruction Review Code 1- Verbalizes Understanding  ? ?  ? ? ?Education: Exercise Physiology & General Exercise Guidelines: ?- Group verbal and written instruction with models to review the exercise physiology of the cardiovascular system and associated  critical values. Provides general exercise guidelines with specific guidelines to those with heart or lung disease.  ?Flowsheet Row Cardiac Rehab from 04/03/2021 in [facility] Cardiac and Pulmonary Rehab  ?Education need identified 04/03/21  ? ?  ? ? ?Education: Flexibility, Balance, Mind/Body Relaxation: ?- Group verbal and visual presentation with interactive activity on the components of exercise prescription. Introduces F.I.T.T principle from ACSM for exercise prescriptions. Reviews F.I.T.T. principles of flexibility and balance exercise training including progression. Also discusses the mind body connection.  Reviews various relaxation techniques to help reduce and manage stress (i.e. Deep breathing, progressive muscle relaxation, and visualization). Balance handout provided to take home. Written material given at graduation. ? ? ?Activity Barriers & Risk Stratification: ? Activity Barriers & Cardiac Risk Stratification - 04/03/21 0943   ? ?  ? Activity Barriers & Cardiac Risk Stratification  ? Activity Barriers Deconditioning;Decreased Ventricular  Function   ? Comments Suprapubic catheter- reports no limitation or problems with movement or activity   ? Cardiac Risk Stratification High   ? ?  ?  ? ?  ? ? ?6 Minute Walk: ? 6 Minute Walk   ? ? [place] Name 04/03/21 0944  ?  ?  ?  ? 6 Minute Walk  ? Phase Initial    ? Distance 1040 feet    ? Walk Time 6 minutes    ? # of Rest Breaks 0    ? MPH 1.96    ? METS 1.41    ? RPE 9    ? Perceived Dyspnea  0    ? VO2 Peak 4.93    ? Symptoms No    ? Resting HR 65 bpm    ? Resting BP 102/56    ? Resting Oxygen Saturation  98 %    ? Exercise Oxygen Saturation  during 6 min walk 94 %    ? Max Ex. HR 84 bpm    ? Max Ex. BP 132/62    ? 2 Minute Post BP 100/58    ? ?  ?  ? ?  ? ? ?Oxygen Initial Assessment: ? ? ?Oxygen Re-Evaluation: ? ? ?Oxygen Discharge (Final Oxygen Re-Evaluation): ? ? ?Initial Exercise Prescription: ? Initial Exercise Prescription - 04/03/21 1400   ? ?  ? Date of  Initial Exercise RX and Referring Provider  ? Date 04/03/21   ? Referring Provider Serafina Royals MD   ?  ? Oxygen  ? Maintain Oxygen Saturation 88% or higher   ?  ? Treadmill  ? MPH 1.8   ? Grade 0.5

## 2021-04-16 ENCOUNTER — Ambulatory Visit: Payer: Medicare Other | Admitting: Family Medicine

## 2021-04-16 ENCOUNTER — Telehealth: Payer: Medicare Other

## 2021-05-09 ENCOUNTER — Encounter: Payer: Self-pay | Admitting: *Deleted

## 2021-05-09 ENCOUNTER — Ambulatory Visit: Payer: Medicare Other | Admitting: Urology

## 2021-05-09 DIAGNOSIS — I214 Non-ST elevation (NSTEMI) myocardial infarction: Secondary | ICD-10-CM

## 2021-05-09 DIAGNOSIS — Z955 Presence of coronary angioplasty implant and graft: Secondary | ICD-10-CM

## 2021-05-09 NOTE — Progress Notes (Signed)
Cardiac Individual Treatment Plan ? ?Patient Details  ?Name: Shawn Andrews ?MRN: [id number] ?Date of Birth: December 27, 1928 ?Referring Provider:   ?Flowsheet Row Cardiac Rehab from 04/03/2021 in [facility] Cardiac and Pulmonary Rehab  ?Referring Provider Serafina Royals MD  ? ?  ? ? ?Initial Encounter Date:  ?Flowsheet Row Cardiac Rehab from 04/03/2021 in [facility] Cardiac and Pulmonary Rehab  ?Date 04/03/21  ? ?  ? ? ?Visit Diagnosis: NSTEMI (non-ST elevated myocardial infarction) ([place]) ? ?Status post coronary artery stent placement ? ?Patient's Home Medications on Admission: ? ?Current Outpatient Medications:  ?  acetaminophen (TYLENOL 8 HOUR) 650 MG CR tablet, Take 1 tablet (650 mg total) by mouth 2 (two) times a day., Disp: , Rfl:  ?  amLODipine (NORVASC) 2.5 MG tablet, Take 1 tablet (2.5 mg total) by mouth daily. (Patient not taking: Reported on 03/16/2021), Disp: 30 tablet, Rfl: 12 ?  atorvastatin (LIPITOR) 40 MG tablet, Take 40 mg by mouth daily. (Patient not taking: Reported on 03/16/2021), Disp: , Rfl:  ?  Cholecalciferol (VITAMIN D3) 25 MCG (1000 UT) CAPS, Take 1 capsule (1,000 Units total) by mouth daily., Disp: 30 capsule, Rfl:  ?  clopidogrel (PLAVIX) 75 MG tablet, Take 1 tablet by mouth daily., Disp: , Rfl:  ?  Coenzyme Q10 (CO Q-10) 100 MG CAPS, Take 1 capsule by mouth daily. (Patient not taking: Reported on 02/25/2021), Disp: , Rfl: 0 ?  cyanocobalamin 1000 MCG tablet, Take 1,000 mcg by mouth daily., Disp: , Rfl:  ?  docusate sodium (COLACE) 100 MG capsule, Take 1 capsule (100 mg total) by mouth daily., Disp: , Rfl:  ?  doxazosin (CARDURA) 4 MG tablet, TAKE 1 TABLET BY MOUTH  DAILY, Disp: 90 tablet, Rfl: 3 ?  famotidine (PEPCID) 20 MG tablet, TAKE 1 TABLET BY MOUTH AT  BEDTIME, Disp: 90 tablet, Rfl: 3 ?  finasteride (PROSCAR) 5 MG tablet, TAKE 1 TABLET BY MOUTH  DAILY, Disp: 90 tablet, Rfl: 3 ?  furosemide (LASIX) 20 MG tablet, Take 1 tablet by mouth every other day., Disp: , Rfl:  ?  isosorbide mononitrate (IMDUR) 30 MG 24  hr tablet, Take 1 tablet by mouth daily., Disp: , Rfl:  ?  loratadine (CLARITIN) 10 MG tablet, Take 1 tablet (10 mg total) by mouth daily as needed for allergies., Disp: 30 tablet, Rfl:  ?  losartan (COZAAR) 50 MG tablet, Take 25 mg by mouth daily., Disp: , Rfl:  ?  metoprolol tartrate (LOPRESSOR) 50 MG tablet, Take 0.5 tablets (25 mg total) by mouth 2 (two) times daily., Disp: , Rfl:  ?  nitroGLYCERIN (NITROSTAT) 0.4 MG SL tablet, Place under the tongue., Disp: , Rfl:  ?  polyethylene glycol powder (GLYCOLAX/MIRALAX) 17 GM/SCOOP powder, Take 8.5-17 g by mouth daily as needed for moderate constipation., Disp: 3350 g, Rfl: 0 ?  primidone (MYSOLINE) 50 MG tablet, Take 2 tablets (100 mg total) by mouth 3 (three) times daily., Disp: , Rfl:  ?  rosuvastatin (CRESTOR) 5 MG tablet, Take 5 mg by mouth daily., Disp: , Rfl:  ?  vitamin B-12 (CYANOCOBALAMIN) 500 MCG tablet, Take 1 tablet (500 mcg total) by mouth once a week., Disp: , Rfl:  ? ?Past Medical History: ?Past Medical History:  ?Diagnosis Date  ? Abnormal drug screen 06/22/2014  ? inapprop neg xanax rpt 3 mo (06/2014)  ? BPH (benign prostatic hypertrophy) 01/21/1998  ? has had 3 biopsies in past ([place]) decided to stop PSA/DRE  ? CHF (congestive heart failure) ([place])   ?  Coronary artery disease   ? Hyperlipidemia 01/21/2002  ? Hypertension 05/22/2003  ? Left lumbar radiculopathy   ? Osteoarthritis 01/22/1988  ? knees, lumbar spondylosis and listhesis  ? ? ?Tobacco Use: ?Social History  ? ?Tobacco Use  ?Smoking Status Former  ? Packs/day: 0.00  ? Years: 0.00  ? Pack years: 0.00  ? Types: Pipe, Cigarettes  ? Quit date: 12/26/2000  ? Years since quitting: 20.3  ?Smokeless Tobacco Current  ? Types: Chew  ?Tobacco Comments  ? quit over 30 years ago  ? ? ?Labs: ?Review Flowsheet   ? ?  ?  Latest Ref Rng & Units 08/18/2018 08/25/2019 08/25/2020 01/17/2021  ?Labs for ITP Cardiac and Pulmonary Rehab  ?Cholestrol 0 - 200 mg/dL 190   202   212     ?LDL (calc) 0 - 99 mg/dL 129   134    138     ?HDL-C >40 mg/dL 43.50   45.80   49.80     ?Trlycerides <150 mg/dL 88.0   110.0   119.0     ?Hemoglobin A1c 4.8 - 5.6 %    5.0    ? ?  01/18/2021  ?Labs for ITP Cardiac and Pulmonary Rehab  ?Cholestrol 157    ?LDL (calc) 99    ?HDL-C 45    ?Trlycerides 66    ?Hemoglobin A1c   ?  ? ? Multiple values from one day are sorted in reverse-chronological order  ?  ?  ? ? ? ?Exercise Target Goals: ?Exercise Program Goal: ?Individual exercise prescription set using results from initial 6 min walk test and THRR while considering  patient?s activity barriers and safety.  ? ?Exercise Prescription Goal: ?Initial exercise prescription builds to 30-45 minutes a day of aerobic activity, 2-3 days per week.  Home exercise guidelines will be given to patient during program as part of exercise prescription that the participant will acknowledge. ? ? ?Education: Aerobic Exercise: ?- Group verbal and visual presentation on the components of exercise prescription. Introduces F.I.T.T principle from ACSM for exercise prescriptions.  Reviews F.I.T.T. principles of aerobic exercise including progression. Written material given at graduation. ?Flowsheet Row Cardiac Rehab from 04/03/2021 in [facility] Cardiac and Pulmonary Rehab  ?Education need identified 04/03/21  ? ?  ? ? ?Education: Resistance Exercise: ?- Group verbal and visual presentation on the components of exercise prescription. Introduces F.I.T.T principle from ACSM for exercise prescriptions  Reviews F.I.T.T. principles of resistance exercise including progression. Written material given at graduation. ? ?  ?Education: Exercise & Equipment Safety: ?- Individual verbal instruction and demonstration of equipment use and safety with use of the equipment. ?Flowsheet Row Cardiac Rehab from 04/03/2021 in [facility] Cardiac and Pulmonary Rehab  ?Education need identified 04/03/21  ?Date 04/03/21  ?Educator KL  ?Instruction Review Code 1- Verbalizes Understanding  ? ?  ? ? ?Education: Exercise  Physiology & General Exercise Guidelines: ?- Group verbal and written instruction with models to review the exercise physiology of the cardiovascular system and associated critical values. Provides general exercise guidelines with specific guidelines to those with heart or lung disease.  ?Flowsheet Row Cardiac Rehab from 04/03/2021 in [facility] Cardiac and Pulmonary Rehab  ?Education need identified 04/03/21  ? ?  ? ? ?Education: Flexibility, Balance, Mind/Body Relaxation: ?- Group verbal and visual presentation with interactive activity on the components of exercise prescription. Introduces F.I.T.T principle from ACSM for exercise prescriptions. Reviews F.I.T.T. principles of flexibility and balance exercise training including progression. Also discusses the mind body connection.  Reviews  various relaxation techniques to help reduce and manage stress (i.e. Deep breathing, progressive muscle relaxation, and visualization). Balance handout provided to take home. Written material given at graduation. ? ? ?Activity Barriers & Risk Stratification: ? Activity Barriers & Cardiac Risk Stratification - 04/03/21 0943   ? ?  ? Activity Barriers & Cardiac Risk Stratification  ? Activity Barriers Deconditioning;Decreased Ventricular Function   ? Comments Suprapubic catheter- reports no limitation or problems with movement or activity   ? Cardiac Risk Stratification High   ? ?  ?  ? ?  ? ? ?6 Minute Walk: ? 6 Minute Walk   ? ? [place] Name 04/03/21 0944  ?  ?  ?  ? 6 Minute Walk  ? Phase Initial    ? Distance 1040 feet    ? Walk Time 6 minutes    ? # of Rest Breaks 0    ? MPH 1.96    ? METS 1.41    ? RPE 9    ? Perceived Dyspnea  0    ? VO2 Peak 4.93    ? Symptoms No    ? Resting HR 65 bpm    ? Resting BP 102/56    ? Resting Oxygen Saturation  98 %    ? Exercise Oxygen Saturation  during 6 min walk 94 %    ? Max Ex. HR 84 bpm    ? Max Ex. BP 132/62    ? 2 Minute Post BP 100/58    ? ?  ?  ? ?  ? ? ?Oxygen Initial Assessment: ? ? ?Oxygen  Re-Evaluation: ? ? ?Oxygen Discharge (Final Oxygen Re-Evaluation): ? ? ?Initial Exercise Prescription: ? Initial Exercise Prescription - 04/03/21 1400   ? ?  ? Date of Initial Exercise RX and Referring

## 2021-05-09 NOTE — Progress Notes (Signed)
30 Day review completed. Medical Director ITP review done, changes made as directed, and signed approval by Medical Director.  

## 2021-05-17 ENCOUNTER — Telehealth: Payer: Self-pay

## 2021-05-17 NOTE — Telephone Encounter (Signed)
Called patient to follow up on Cardiac Rehab appointments and to get scheduled. Orientation was completed 1 month ago.  Left voicemail asking for callback ?

## 2021-05-21 NOTE — Patient Instructions (Signed)
Patient Instructions ? ?Patient Details  ?Name: Shawn Andrews ?MRN: [id number] ?Date of Birth: [birth year] ?Referring Provider:  Corey Skains, MD ? ?Below are your personal goals for exercise, nutrition, and risk factors. Our goal is to help you stay on track towards obtaining and maintaining these goals. We will be discussing your progress on these goals with you throughout the program. ? ?Initial Exercise Prescription: ? Initial Exercise Prescription - 04/03/21 1400   ? ?  ? Date of Initial Exercise RX and Referring Provider  ? Date 04/03/21   ? Referring Provider Serafina Royals MD   ?  ? Oxygen  ? Maintain Oxygen Saturation 88% or higher   ?  ? Treadmill  ? MPH 1.8   ? Grade 0.5   ? Minutes 15   ? METs 2.5   ?  ? NuStep  ? Level 1   ? SPM 80   ? Minutes 15   ? METs 1.4   ?  ? REL-XR  ? Level 1   ? Speed 50   ? Minutes 15   ? METs 1.4   ?  ? Prescription Details  ? Frequency (times per week) 2-3   TBD  ? Duration Progress to 30 minutes of continuous aerobic without signs/symptoms of physical distress   ?  ? Intensity  ? THRR 40-80% of Max Heartrate 91-117   ? Ratings of Perceived Exertion 11-13   ? Perceived Dyspnea 0-4   ?  ? Progression  ? Progression Continue to progress workloads to maintain intensity without signs/symptoms of physical distress.   ?  ? Resistance Training  ? Training Prescription Yes   ? Weight 3 lb   ? Reps 10-15   ? ?  ?  ? ?  ? ? ?Exercise Goals: ?Frequency: Be able to perform aerobic exercise two to three times per week in program working toward 2-5 days per week of home exercise. ? ?Intensity: Work with a perceived exertion of 11 (fairly light) - 15 (hard) while following your exercise prescription.  We will make changes to your prescription with you as you progress through the program. ?  ?Duration: Be able to do 30 to 45 minutes of continuous aerobic exercise in addition to a 5 minute warm-up and a 5 minute cool-down routine. ?  ?Nutrition Goals: ?Your personal nutrition goals will  be established when you do your nutrition analysis with the dietician. ? ?The following are general nutrition guidelines to follow: ?Cholesterol < '200mg'$ /day ?Sodium < '1500mg'$ /day ?Fiber: Men over 50 yrs - 30 grams per day ? ?Personal Goals: ? Personal Goals and Risk Factors at Admission - 04/03/21 0945   ? ?  ? Core Components/Risk Factors/Patient Goals on Admission  ?  Weight Management Yes;Weight Maintenance   ? Intervention Weight Management: Develop a combined nutrition and exercise program designed to reach desired caloric intake, while maintaining appropriate intake of nutrient and fiber, sodium and fats, and appropriate energy expenditure required for the weight goal.;Weight Management: Provide education and appropriate resources to help participant work on and attain dietary goals.;Weight Management/Obesity: Establish reasonable short term and long term weight goals.   ? Admit Weight 178 lb (80.7 kg)   ? Goal Weight: Short Term 178 lb (80.7 kg)   ? Goal Weight: Long Term 178 lb (80.7 kg)   ? Expected Outcomes Short Term: Continue to assess and modify interventions until short term weight is achieved;Long Term: Adherence to nutrition and physical activity/exercise program aimed toward attainment of  established weight goal;Weight Maintenance: Understanding of the daily nutrition guidelines, which includes 25-35% calories from fat, 7% or less cal from saturated fats, less than '200mg'$  cholesterol, less than 1.5gm of sodium, & 5 or more servings of fruits and vegetables daily;Understanding recommendations for meals to include 15-35% energy as protein, 25-35% energy from fat, 35-60% energy from carbohydrates, less than '200mg'$  of dietary cholesterol, 20-35 gm of total fiber daily;Understanding of distribution of calorie intake throughout the day with the consumption of 4-5 meals/snacks   ? Heart Failure Yes   ? Intervention Provide a combined exercise and nutrition program that is supplemented with education, support  and counseling about heart failure. Directed toward relieving symptoms such as shortness of breath, decreased exercise tolerance, and extremity edema.   ? Expected Outcomes Improve functional capacity of life;Short term: Attendance in program 2-3 days a week with increased exercise capacity. Reported lower sodium intake. Reported increased fruit and vegetable intake. Reports medication compliance.;Long term: Adoption of self-care skills and reduction of barriers for early signs and symptoms recognition and intervention leading to self-care maintenance.;Short term: Daily weights obtained and reported for increase. Utilizing diuretic protocols set by physician.   ? Hypertension Yes   ? Intervention Provide education on lifestyle modifcations including regular physical activity/exercise, weight management, moderate sodium restriction and increased consumption of fresh fruit, vegetables, and low fat dairy, alcohol moderation, and smoking cessation.;Monitor prescription use compliance.   ? Expected Outcomes Short Term: Continued assessment and intervention until BP is < 140/79m HG in hypertensive participants. < 130/834mHG in hypertensive participants with diabetes, heart failure or chronic kidney disease.;Long Term: Maintenance of blood pressure at goal levels.   ? Lipids Yes   ? Intervention Provide education and support for participant on nutrition & aerobic/resistive exercise along with prescribed medications to achieve LDL '70mg'$ , HDL >'40mg'$ .   ? Expected Outcomes Short Term: Participant states understanding of desired cholesterol values and is compliant with medications prescribed. Participant is following exercise prescription and nutrition guidelines.;Long Term: Cholesterol controlled with medications as prescribed, with individualized exercise RX and with personalized nutrition plan. Value goals: LDL < '70mg'$ , HDL > 40 mg.   ? ?  ?  ? ?  ? ? ?Tobacco Use Initial Evaluation: ?Social History  ? ?Tobacco Use   ?Smoking Status Former  ? Packs/day: 0.00  ? Years: 0.00  ? Pack years: 0.00  ? Types: Pipe, Cigarettes  ? Quit date: 12/26/2000  ? Years since quitting: 20.4  ?Smokeless Tobacco Current  ? Types: Chew  ?Tobacco Comments  ? quit over 30 years ago  ? ? ?Exercise Goals and Review: ? Exercise Goals   ? ? [place] 04/03/21 0945  ?  ?  ?  ?  ?  ? Exercise Goals  ? Increase Physical Activity Yes      ? Intervention Provide advice, education, support and counseling about physical activity/exercise needs.;Develop an individualized exercise prescription for aerobic and resistive training based on initial evaluation findings, risk stratification, comorbidities and participant's personal goals.      ? Expected Outcomes Short Term: Attend rehab on a regular basis to increase amount of physical activity.;Long Term: Add in home exercise to make exercise part of routine and to increase amount of physical activity.;Long Term: Exercising regularly at least 3-5 days a week.      ? Increase Strength and Stamina Yes      ? Intervention Provide advice, education, support and counseling about physical activity/exercise needs.;Develop an individualized exercise prescription for aerobic and  resistive training based on initial evaluation findings, risk stratification, comorbidities and participant's personal goals.      ? Expected Outcomes Short Term: Increase workloads from initial exercise prescription for resistance, speed, and METs.;Short Term: Perform resistance training exercises routinely during rehab and add in resistance training at home;Long Term: Improve cardiorespiratory fitness, muscular endurance and strength as measured by increased METs and functional capacity (6MWT)      ? Able to understand and use rate of perceived exertion (RPE) scale Yes      ? Intervention Provide education and explanation on how to use RPE scale      ? Expected Outcomes Short Term: Able to use RPE daily in rehab to express subjective intensity  level;Long Term:  Able to use RPE to guide intensity level when exercising independently      ? Able to understand and use Dyspnea scale Yes      ? Intervention Provide education and explanation on how to use Dyspnea

## 2021-05-22 NOTE — Progress Notes (Signed)
Suprapubic Cath Change ? ?Patient is present today for a suprapubic catheter change due to urinary retention.  8 ml of water was drained from the balloon, a 16 FR foley cath was removed from the tract with out difficulty.  Site was cleaned and prepped in a sterile fashion with betadine.  A 16 FR foley cath was replaced into the tract no complications were noted. Urine return was noted, 10 ml of sterile water was inflated into the balloon and a leg bag was attached for drainage.  Patient tolerated well. A night bag was given to patient and proper instruction was given on how to switch bags.   ? ?Performed by: Zara Council, PA-C  ? ?Follow up: In 6 weeks for suprapubic tube exchange ?

## 2021-05-23 ENCOUNTER — Ambulatory Visit: Payer: Medicare Other | Admitting: Urology

## 2021-05-23 DIAGNOSIS — Z435 Encounter for attention to cystostomy: Secondary | ICD-10-CM | POA: Diagnosis not present

## 2021-05-23 DIAGNOSIS — R339 Retention of urine, unspecified: Secondary | ICD-10-CM

## 2021-05-29 ENCOUNTER — Encounter: Payer: Medicare Other | Attending: Internal Medicine

## 2021-05-29 DIAGNOSIS — I214 Non-ST elevation (NSTEMI) myocardial infarction: Secondary | ICD-10-CM

## 2021-05-29 DIAGNOSIS — Z955 Presence of coronary angioplasty implant and graft: Secondary | ICD-10-CM | POA: Diagnosis present

## 2021-05-29 DIAGNOSIS — Z48812 Encounter for surgical aftercare following surgery on the circulatory system: Secondary | ICD-10-CM | POA: Diagnosis not present

## 2021-05-29 DIAGNOSIS — I252 Old myocardial infarction: Secondary | ICD-10-CM | POA: Insufficient documentation

## 2021-05-29 NOTE — Progress Notes (Signed)
Daily Session Note ? ?Patient Details  ?Name: Shawn Andrews ?MRN: [id number] ?Date of Birth: 05/12/1928 ?Referring Provider:   ?Flowsheet Row Cardiac Rehab from 04/03/2021 in [facility] Cardiac and Pulmonary Rehab  ?Referring Provider Kowalski, Bruce MD  ? ?  ? ? ?Encounter Date: 05/29/2021 ? ?Check In: ? Session Check In - 05/29/21 0933   ? ?  ? Check-In  ? Supervising physician immediately available to respond to emergencies See telemetry face sheet for immediately available ER MD   ? Location [facility]-Cardiac & Pulmonary Rehab   ? Staff Present Kelly Bollinger, MPA, RN;Jessica Hawkins, MA, RCEP, CCRP, CCET;Kelly Hayes, BS, ACSM CEP, Exercise Physiologist;Kara Langdon, MS, ASCM CEP, Exercise Physiologist   ? Virtual Visit No   ? Medication changes reported     No   ? Fall or balance concerns reported    No   ? Warm-up and Cool-down Performed on first and last piece of equipment   ? Resistance Training Performed Yes   ? VAD Patient? No   ? PAD/SET Patient? No   ?  ? Pain Assessment  ? Currently in Pain? No/denies   ? ?  ?  ? ?  ? ? ? ? ? ?Social History  ? ?Tobacco Use  ?Smoking Status Former  ? Packs/day: 0.00  ? Years: 0.00  ? Pack years: 0.00  ? Types: Pipe, Cigarettes  ? Quit date: 12/26/2000  ? Years since quitting: 20.4  ?Smokeless Tobacco Current  ? Types: Chew  ?Tobacco Comments  ? quit over 30 years ago  ? ? ?Goals Met:  ?Independence with exercise equipment ?Exercise tolerated well ?No report of concerns or symptoms today ?Strength training completed today ? ?Goals Unmet:  ?Not Applicable ? ?Comments: First full day of exercise!  Patient was oriented to gym and equipment including functions, settings, policies, and procedures.  Patient's individual exercise prescription and treatment plan were reviewed.  All starting workloads were established based on the results of the 6 minute walk test done at initial orientation visit.  The plan for exercise progression was also introduced and progression will be customized based on  patient's performance and goals. ? ? ? ?Dr. Mark Miller is Medical Director for [facility].  ?Dr. Fuad Aleskerov is Medical Director for [facility]. ?

## 2021-05-31 ENCOUNTER — Encounter: Payer: Medicare Other | Admitting: *Deleted

## 2021-05-31 DIAGNOSIS — Z955 Presence of coronary angioplasty implant and graft: Secondary | ICD-10-CM | POA: Diagnosis not present

## 2021-05-31 DIAGNOSIS — I214 Non-ST elevation (NSTEMI) myocardial infarction: Secondary | ICD-10-CM

## 2021-05-31 NOTE — Progress Notes (Signed)
Daily Session Note ? ?Patient Details  ?Name: Shawn Andrews ?MRN: [id number] ?Date of Birth: 1928-04-20 ?Referring Provider:   ?Flowsheet Row Cardiac Rehab from 04/03/2021 in [facility] Cardiac and Pulmonary Rehab  ?Referring Provider Serafina Royals MD  ? ?  ? ? ?Encounter Date: 05/31/2021 ? ?Check In: ? Session Check In - 05/31/21 0938   ? ?  ? Check-In  ? Supervising physician immediately available to respond to emergencies See telemetry face sheet for immediately available ER MD   ? Location [facility]-Cardiac & Pulmonary Rehab   ? Staff Present Hope Budds, RDN, LDN;Joseph [place], RCP,RRT,BSRT;Heath Lark, RN, BSN, CCRP   ? Virtual Visit No   ? Medication changes reported     No   ? Fall or balance concerns reported    No   ? Warm-up and Cool-down Performed on first and last piece of equipment   ? Resistance Training Performed Yes   ? VAD Patient? No   ? PAD/SET Patient? No   ?  ? Pain Assessment  ? Currently in Pain? No/denies   ? ?  ?  ? ?  ? ? ? ? ? ?Social History  ? ?Tobacco Use  ?Smoking Status Former  ? Packs/day: 0.00  ? Years: 0.00  ? Pack years: 0.00  ? Types: Pipe, Cigarettes  ? Quit date: 12/26/2000  ? Years since quitting: 20.4  ?Smokeless Tobacco Current  ? Types: Chew  ?Tobacco Comments  ? quit over 30 years ago  ? ? ?Goals Met:  ?Exercise tolerated well ?No report of concerns or symptoms today ? ?Goals Unmet:  ?Not Applicable ? ?Comments: Pt able to follow exercise prescription today without complaint.  Will continue to monitor for progression. ? ? ? ?Dr. Emily Filbert is Medical Director for [facility].  ?Dr. Ottie Glazier is Medical Director for [facility] Pulmonary [facility]. ?

## 2021-06-05 DIAGNOSIS — Z955 Presence of coronary angioplasty implant and graft: Secondary | ICD-10-CM | POA: Diagnosis not present

## 2021-06-05 DIAGNOSIS — I214 Non-ST elevation (NSTEMI) myocardial infarction: Secondary | ICD-10-CM

## 2021-06-05 NOTE — Progress Notes (Signed)
Daily Session Note ? ?Patient Details  ?Name: Teaghan F Wiehe ?MRN: [id number] ?Date of Birth: 08/25/1928 ?Referring Provider:   ?Flowsheet Row Cardiac Rehab from 04/03/2021 in [facility] Cardiac and Pulmonary Rehab  ?Referring Provider Kowalski, Bruce MD  ? ?  ? ? ?Encounter Date: 06/05/2021 ? ?Check In: ? Session Check In - 06/05/21 0922   ? ?  ? Check-In  ? Supervising physician immediately available to respond to emergencies See telemetry face sheet for immediately available ER MD   ? Location [facility]-Cardiac & Pulmonary Rehab   ? Staff Present Kelly Bollinger, MPA, RN;Jessica Hawkins, MA, RCEP, CCRP, CCET;Kelly Hayes, BS, ACSM CEP, Exercise Physiologist   ? Virtual Visit No   ? Medication changes reported     No   ? Fall or balance concerns reported    No   ? Warm-up and Cool-down Performed on first and last piece of equipment   ? Resistance Training Performed Yes   ? VAD Patient? No   ? PAD/SET Patient? No   ?  ? Pain Assessment  ? Currently in Pain? No/denies   ? ?  ?  ? ?  ? ? ? ? ? ?Social History  ? ?Tobacco Use  ?Smoking Status Former  ? Packs/day: 0.00  ? Years: 0.00  ? Pack years: 0.00  ? Types: Pipe, Cigarettes  ? Quit date: 12/26/2000  ? Years since quitting: 20.4  ?Smokeless Tobacco Current  ? Types: Chew  ?Tobacco Comments  ? quit over 30 years ago  ? ? ?Goals Met:  ?Independence with exercise equipment ?Exercise tolerated well ?No report of concerns or symptoms today ?Strength training completed today ? ?Goals Unmet:  ?Not Applicable ? ?Comments: Pt able to follow exercise prescription today without complaint.  Will continue to monitor for progression. ? ? ? ?Dr. Mark Miller is Medical Director for [facility].  ?Dr. Fuad Aleskerov is Medical Director for [facility]. ?

## 2021-06-06 ENCOUNTER — Encounter: Payer: Self-pay | Admitting: *Deleted

## 2021-06-06 ENCOUNTER — Other Ambulatory Visit: Payer: Self-pay | Admitting: *Deleted

## 2021-06-06 DIAGNOSIS — Z955 Presence of coronary angioplasty implant and graft: Secondary | ICD-10-CM

## 2021-06-06 DIAGNOSIS — I214 Non-ST elevation (NSTEMI) myocardial infarction: Secondary | ICD-10-CM

## 2021-06-06 NOTE — Progress Notes (Signed)
30 Day review completed. Medical Director ITP review done, changes made as directed, and signed approval by Medical Director.  

## 2021-06-06 NOTE — Progress Notes (Signed)
Cardiac Individual Treatment Plan ? ?Patient Details  ?Name: Shawn Andrews ?MRN: [id number] ?Date of Birth: 1928-09-26 ?Referring Provider:   ?Flowsheet Row Cardiac Rehab from 04/03/2021 in [facility] Cardiac and Pulmonary Rehab  ?Referring Provider Serafina Royals MD  ? ?  ? ? ?Initial Encounter Date:  ?Flowsheet Row Cardiac Rehab from 04/03/2021 in [facility] Cardiac and Pulmonary Rehab  ?Date 04/03/21  ? ?  ? ? ?Visit Diagnosis: NSTEMI (non-ST elevated myocardial infarction) ([place]) ? ?Status post coronary artery stent placement ? ?Patient's Home Medications on Admission: ? ?Current Outpatient Medications:  ?  acetaminophen (TYLENOL 8 HOUR) 650 MG CR tablet, Take 1 tablet (650 mg total) by mouth 2 (two) times a day., Disp: , Rfl:  ?  amLODipine (NORVASC) 2.5 MG tablet, Take 1 tablet (2.5 mg total) by mouth daily. (Patient not taking: Reported on 03/16/2021), Disp: 30 tablet, Rfl: 12 ?  atorvastatin (LIPITOR) 40 MG tablet, Take 40 mg by mouth daily. (Patient not taking: Reported on 03/16/2021), Disp: , Rfl:  ?  Cholecalciferol (VITAMIN D3) 25 MCG (1000 UT) CAPS, Take 1 capsule (1,000 Units total) by mouth daily., Disp: 30 capsule, Rfl:  ?  clopidogrel (PLAVIX) 75 MG tablet, Take 1 tablet by mouth daily., Disp: , Rfl:  ?  Coenzyme Q10 (CO Q-10) 100 MG CAPS, Take 1 capsule by mouth daily. (Patient not taking: Reported on 02/25/2021), Disp: , Rfl: 0 ?  cyanocobalamin 1000 MCG tablet, Take 1,000 mcg by mouth daily., Disp: , Rfl:  ?  docusate sodium (COLACE) 100 MG capsule, Take 1 capsule (100 mg total) by mouth daily., Disp: , Rfl:  ?  doxazosin (CARDURA) 4 MG tablet, TAKE 1 TABLET BY MOUTH  DAILY, Disp: 90 tablet, Rfl: 3 ?  famotidine (PEPCID) 20 MG tablet, TAKE 1 TABLET BY MOUTH AT  BEDTIME, Disp: 90 tablet, Rfl: 3 ?  finasteride (PROSCAR) 5 MG tablet, TAKE 1 TABLET BY MOUTH  DAILY, Disp: 90 tablet, Rfl: 3 ?  furosemide (LASIX) 20 MG tablet, Take 1 tablet by mouth every other day., Disp: , Rfl:  ?  isosorbide mononitrate (IMDUR) 30 MG 24  hr tablet, Take 1 tablet by mouth daily., Disp: , Rfl:  ?  loratadine (CLARITIN) 10 MG tablet, Take 1 tablet (10 mg total) by mouth daily as needed for allergies., Disp: 30 tablet, Rfl:  ?  losartan (COZAAR) 50 MG tablet, Take 25 mg by mouth daily., Disp: , Rfl:  ?  metoprolol tartrate (LOPRESSOR) 50 MG tablet, Take 0.5 tablets (25 mg total) by mouth 2 (two) times daily., Disp: , Rfl:  ?  nitroGLYCERIN (NITROSTAT) 0.4 MG SL tablet, Place under the tongue., Disp: , Rfl:  ?  polyethylene glycol powder (GLYCOLAX/MIRALAX) 17 GM/SCOOP powder, Take 8.5-17 g by mouth daily as needed for moderate constipation., Disp: 3350 g, Rfl: 0 ?  primidone (MYSOLINE) 50 MG tablet, Take 2 tablets (100 mg total) by mouth 3 (three) times daily., Disp: , Rfl:  ?  rosuvastatin (CRESTOR) 5 MG tablet, Take 5 mg by mouth daily., Disp: , Rfl:  ?  vitamin B-12 (CYANOCOBALAMIN) 500 MCG tablet, Take 1 tablet (500 mcg total) by mouth once a week., Disp: , Rfl:  ? ?Past Medical History: ?Past Medical History:  ?Diagnosis Date  ? Abnormal drug screen 06/22/2014  ? inapprop neg xanax rpt 3 mo (06/2014)  ? BPH (benign prostatic hypertrophy) 01/21/1998  ? has had 3 biopsies in past ([facility]) decided to stop PSA/DRE  ? CHF (congestive heart failure) ([place])   ?  Coronary artery disease   ? Hyperlipidemia 01/21/2002  ? Hypertension 05/22/2003  ? Left lumbar radiculopathy   ? Osteoarthritis 01/22/1988  ? knees, lumbar spondylosis and listhesis  ? ? ?Tobacco Use: ?Social History  ? ?Tobacco Use  ?Smoking Status Former  ? Packs/day: 0.00  ? Years: 0.00  ? Pack years: 0.00  ? Types: Pipe, Cigarettes  ? Quit date: 12/26/2000  ? Years since quitting: 20.4  ?Smokeless Tobacco Current  ? Types: Chew  ?Tobacco Comments  ? quit over 30 years ago  ? ? ?Labs: ?Review Flowsheet   ? ?  ?  Latest Ref Rng & Units 08/18/2018 08/25/2019 08/25/2020 01/17/2021  ?Labs for ITP Cardiac and Pulmonary Rehab  ?Cholestrol 0 - 200 mg/dL 190   202   212     ?LDL (calc) 0 - 99 mg/dL 129   134    138     ?HDL-C >40 mg/dL 43.50   45.80   49.80     ?Trlycerides <150 mg/dL 88.0   110.0   119.0     ?Hemoglobin A1c 4.8 - 5.6 %    5.0    ? ?  01/18/2021  ?Labs for ITP Cardiac and Pulmonary Rehab  ?Cholestrol 157    ?LDL (calc) 99    ?HDL-C 45    ?Trlycerides 66    ?Hemoglobin A1c   ?  ?  ?  ? ? ? ?Exercise Target Goals: ?Exercise Program Goal: ?Individual exercise prescription set using results from initial 6 min walk test and THRR while considering  patient?s activity barriers and safety.  ? ?Exercise Prescription Goal: ?Initial exercise prescription builds to 30-45 minutes a day of aerobic activity, 2-3 days per week.  Home exercise guidelines will be given to patient during program as part of exercise prescription that the participant will acknowledge. ? ? ?Education: Aerobic Exercise: ?- Group verbal and visual presentation on the components of exercise prescription. Introduces F.I.T.T principle from ACSM for exercise prescriptions.  Reviews F.I.T.T. principles of aerobic exercise including progression. Written material given at graduation. ?Flowsheet Row Cardiac Rehab from 04/03/2021 in [facility] Cardiac and Pulmonary Rehab  ?Education need identified 04/03/21  ? ?  ? ? ?Education: Resistance Exercise: ?- Group verbal and visual presentation on the components of exercise prescription. Introduces F.I.T.T principle from ACSM for exercise prescriptions  Reviews F.I.T.T. principles of resistance exercise including progression. Written material given at graduation. ? ?  ?Education: Exercise & Equipment Safety: ?- Individual verbal instruction and demonstration of equipment use and safety with use of the equipment. ?Flowsheet Row Cardiac Rehab from 04/03/2021 in [facility] Cardiac and Pulmonary Rehab  ?Education need identified 04/03/21  ?Date 04/03/21  ?Educator KL  ?Instruction Review Code 1- Verbalizes Understanding  ? ?  ? ? ?Education: Exercise Physiology & General Exercise Guidelines: ?- Group verbal and written  instruction with models to review the exercise physiology of the cardiovascular system and associated critical values. Provides general exercise guidelines with specific guidelines to those with heart or lung disease.  ?Flowsheet Row Cardiac Rehab from 04/03/2021 in [facility] Cardiac and Pulmonary Rehab  ?Education need identified 04/03/21  ? ?  ? ? ?Education: Flexibility, Balance, Mind/Body Relaxation: ?- Group verbal and visual presentation with interactive activity on the components of exercise prescription. Introduces F.I.T.T principle from ACSM for exercise prescriptions. Reviews F.I.T.T. principles of flexibility and balance exercise training including progression. Also discusses the mind body connection.  Reviews various relaxation techniques to help reduce and manage stress (i.e. Deep breathing, progressive  muscle relaxation, and visualization). Balance handout provided to take home. Written material given at graduation. ? ? ?Activity Barriers & Risk Stratification: ? Activity Barriers & Cardiac Risk Stratification - 04/03/21 0943   ? ?  ? Activity Barriers & Cardiac Risk Stratification  ? Activity Barriers Deconditioning;Decreased Ventricular Function   ? Comments Suprapubic catheter- reports no limitation or problems with movement or activity   ? Cardiac Risk Stratification High   ? ?  ?  ? ?  ? ? ?6 Minute Walk: ? 6 Minute Walk   ? ? [place] Name 04/03/21 0944  ?  ?  ?  ? 6 Minute Walk  ? Phase Initial    ? Distance 1040 feet    ? Walk Time 6 minutes    ? # of Rest Breaks 0    ? MPH 1.96    ? METS 1.41    ? RPE 9    ? Perceived Dyspnea  0    ? VO2 Peak 4.93    ? Symptoms No    ? Resting HR 65 bpm    ? Resting BP 102/56    ? Resting Oxygen Saturation  98 %    ? Exercise Oxygen Saturation  during 6 min walk 94 %    ? Max Ex. HR 84 bpm    ? Max Ex. BP 132/62    ? 2 Minute Post BP 100/58    ? ?  ?  ? ?  ? ? ?Oxygen Initial Assessment: ? ? ?Oxygen Re-Evaluation: ? ? ?Oxygen Discharge (Final Oxygen  Re-Evaluation): ? ? ?Initial Exercise Prescription: ? Initial Exercise Prescription - 04/03/21 1400   ? ?  ? Date of Initial Exercise RX and Referring Provider  ? Date 04/03/21   ? Referring Provider Serafina Royals MD   ?  ? O

## 2021-06-07 ENCOUNTER — Encounter: Payer: Medicare Other | Admitting: *Deleted

## 2021-06-07 DIAGNOSIS — Z955 Presence of coronary angioplasty implant and graft: Secondary | ICD-10-CM | POA: Diagnosis not present

## 2021-06-07 DIAGNOSIS — I214 Non-ST elevation (NSTEMI) myocardial infarction: Secondary | ICD-10-CM

## 2021-06-07 NOTE — Progress Notes (Signed)
Daily Session Note  Patient Details  Name: Shawn Andrews MRN: [id number] Date of Birth: 23-Nov-1928 Referring Provider:   Flowsheet Row Cardiac Rehab from 04/03/2021 in [facility] Cardiac and Pulmonary Rehab  Referring Provider Serafina Royals MD       Encounter Date: 06/07/2021  Check In:  Session Check In - 06/07/21 1004       Check-In   Supervising physician immediately available to respond to emergencies See telemetry face sheet for immediately available ER MD    Location [facility]-Cardiac & Pulmonary Rehab    Staff Present Heath Lark, RN, BSN, CCRP;Kelly Bollinger, MPA, RN;Melissa [place], RDN, LDN    Virtual Visit No    Medication changes reported     No    Fall or balance concerns reported    No    Warm-up and Cool-down Performed on first and last piece of equipment    Resistance Training Performed Yes    VAD Patient? No    PAD/SET Patient? No      Pain Assessment   Currently in Pain? No/denies                Social History   Tobacco Use  Smoking Status Former   Packs/day: 0.00   Years: 0.00   Pack years: 0.00   Types: Pipe, Cigarettes   Quit date: 12/26/2000   Years since quitting: 20.4  Smokeless Tobacco Current   Types: Chew  Tobacco Comments   quit over 30 years ago    Goals Met:  Independence with exercise equipment Exercise tolerated well No report of concerns or symptoms today  Goals Unmet:  Not Applicable  Comments: Pt able to follow exercise prescription today without complaint.  Will continue to monitor for progression.    Dr. Emily Filbert is Medical Director for [place].  Dr. Ottie Glazier is Medical Director for [facility] Pulmonary Rehabilitation.

## 2021-06-12 DIAGNOSIS — Z955 Presence of coronary angioplasty implant and graft: Secondary | ICD-10-CM

## 2021-06-12 DIAGNOSIS — I214 Non-ST elevation (NSTEMI) myocardial infarction: Secondary | ICD-10-CM

## 2021-06-12 NOTE — Progress Notes (Signed)
Daily Session Note  Patient Details  Name: ZIGMOND TRELA MRN: [id number] Date of Birth: 01-23-1928 Referring Provider:   Flowsheet Row Cardiac Rehab from 04/03/2021 in [facility] Cardiac and Pulmonary Rehab  Referring Provider Serafina Royals MD       Encounter Date: 06/12/2021  Check In:  Session Check In - 06/12/21 0921       Check-In   Supervising physician immediately available to respond to emergencies See telemetry face sheet for immediately available ER MD    Location [facility]-Cardiac & Pulmonary Rehab    Staff Present Birdie Sons, MPA, RN;Jessica [place], MA, RCEP, CCRP, Rosalio Macadamia, BS, ACSM CEP, Exercise Physiologist    Virtual Visit No    Medication changes reported     No    Fall or balance concerns reported    No    Warm-up and Cool-down Performed on first and last piece of equipment    Resistance Training Performed Yes    VAD Patient? No    PAD/SET Patient? No      Pain Assessment   Currently in Pain? No/denies                Social History   Tobacco Use  Smoking Status Former   Packs/day: 0.00   Years: 0.00   Pack years: 0.00   Types: Pipe, Cigarettes   Quit date: 12/26/2000   Years since quitting: 20.4  Smokeless Tobacco Current   Types: Chew  Tobacco Comments   quit over 30 years ago    Goals Met:  Independence with exercise equipment Exercise tolerated well No report of concerns or symptoms today Strength training completed today  Goals Unmet:  Not Applicable  Comments: Pt able to follow exercise prescription today without complaint.  Will continue to monitor for progression.    Dr. Emily Filbert is Medical Director for [place].  Dr. Ottie Glazier is Medical Director for [facility] Pulmonary Rehabilitation.

## 2021-06-14 DIAGNOSIS — Z955 Presence of coronary angioplasty implant and graft: Secondary | ICD-10-CM | POA: Diagnosis not present

## 2021-06-14 DIAGNOSIS — I214 Non-ST elevation (NSTEMI) myocardial infarction: Secondary | ICD-10-CM

## 2021-06-14 NOTE — Progress Notes (Signed)
Daily Session Note  Patient Details  Name: Shawn Andrews MRN: [id number] Date of Birth: 08/04/1928 Referring Provider:   Flowsheet Row Cardiac Rehab from 04/03/2021 in [facility] Cardiac and Pulmonary Rehab  Referring Provider Kowalski, Bruce MD       Encounter Date: 06/14/2021  Check In:  Session Check In - 06/14/21 0917       Check-In   Supervising physician immediately available to respond to emergencies See telemetry face sheet for immediately available ER MD    Location [facility]-Cardiac & Pulmonary Rehab    Staff Present Kelly Bollinger, MPA, RN;Laureen Brown, BS, RRT, CPFT;Kara Langdon, MS, ASCM CEP, Exercise Physiologist    Virtual Visit No    Medication changes reported     No    Fall or balance concerns reported    No    Warm-up and Cool-down Performed on first and last piece of equipment    Resistance Training Performed Yes    VAD Patient? No    PAD/SET Patient? No      Pain Assessment   Currently in Pain? No/denies                Social History   Tobacco Use  Smoking Status Former   Packs/day: 0.00   Years: 0.00   Pack years: 0.00   Types: Pipe, Cigarettes   Quit date: 12/26/2000   Years since quitting: 20.4  Smokeless Tobacco Current   Types: Chew  Tobacco Comments   quit over 30 years ago    Goals Met:  Independence with exercise equipment Exercise tolerated well No report of concerns or symptoms today Strength training completed today  Goals Unmet:  Not Applicable  Comments: Pt able to follow exercise prescription today without complaint.  Will continue to monitor for progression.    Dr. Mark Miller is Medical Director for [facility].  Dr. Fuad Aleskerov is Medical Director for [facility] Pulmonary Rehabilitation. 

## 2021-06-19 DIAGNOSIS — I214 Non-ST elevation (NSTEMI) myocardial infarction: Secondary | ICD-10-CM

## 2021-06-19 DIAGNOSIS — Z955 Presence of coronary angioplasty implant and graft: Secondary | ICD-10-CM | POA: Diagnosis not present

## 2021-06-19 NOTE — Progress Notes (Signed)
Daily Session Note  Patient Details  Name: Shawn Andrews MRN: [id number] Date of Birth: 03/15/1928 Referring Provider:   Flowsheet Row Cardiac Rehab from 04/03/2021 in [facility] Cardiac and Pulmonary Rehab  Referring Provider Kowalski, Bruce MD       Encounter Date: 06/19/2021  Check In:  Session Check In - 06/19/21 0913       Check-In   Supervising physician immediately available to respond to emergencies See telemetry face sheet for immediately available ER MD    Location [facility]-Cardiac & Pulmonary Rehab    Staff Present Kelly Bollinger, MPA, RN;Jessica Hawkins, MA, RCEP, CCRP, CCET;Kelly Hayes, BS, ACSM CEP, Exercise Physiologist    Virtual Visit No    Medication changes reported     No    Fall or balance concerns reported    No    Warm-up and Cool-down Performed on first and last piece of equipment    Resistance Training Performed Yes    VAD Patient? No    PAD/SET Patient? No      Pain Assessment   Currently in Pain? No/denies                Social History   Tobacco Use  Smoking Status Former   Packs/day: 0.00   Years: 0.00   Pack years: 0.00   Types: Pipe, Cigarettes   Quit date: 12/26/2000   Years since quitting: 20.4  Smokeless Tobacco Current   Types: Chew  Tobacco Comments   quit over 30 years ago    Goals Met:  Independence with exercise equipment Exercise tolerated well Personal goals reviewed No report of concerns or symptoms today Strength training completed today  Goals Unmet:  Not Applicable  Comments: Pt able to follow exercise prescription today without complaint.  Will continue to monitor for progression.    Dr. Mark Miller is Medical Director for [facility].  Dr. Fuad Aleskerov is Medical Director for [facility]. 

## 2021-06-21 ENCOUNTER — Encounter: Payer: Medicare Other | Attending: Internal Medicine

## 2021-06-21 DIAGNOSIS — I214 Non-ST elevation (NSTEMI) myocardial infarction: Secondary | ICD-10-CM | POA: Diagnosis present

## 2021-06-21 DIAGNOSIS — Z955 Presence of coronary angioplasty implant and graft: Secondary | ICD-10-CM | POA: Diagnosis present

## 2021-06-21 NOTE — Progress Notes (Signed)
Daily Session Note  Patient Details  Name: Shawn Andrews MRN: [id number] Date of Birth: 12-23-1928 Referring Provider:   Flowsheet Row Cardiac Rehab from 04/03/2021 in [facility] Cardiac and Pulmonary Rehab  Referring Provider Serafina Royals MD       Encounter Date: 06/21/2021  Check In:  Session Check In - 06/21/21 0914       Check-In   Supervising physician immediately available to respond to emergencies See telemetry face sheet for immediately available ER MD    Location [facility]-Cardiac & Pulmonary Rehab    Staff Present Birdie Sons, MPA, Nino Glow, MS, ASCM CEP, Exercise Physiologist;Noah Tickle, BS, Exercise Physiologist;Laureen Owens Shark, BS, RRT, CPFT    Virtual Visit No    Medication changes reported     No    Fall or balance concerns reported    No    Warm-up and Cool-down Performed on first and last piece of equipment    Resistance Training Performed Yes    VAD Patient? No    PAD/SET Patient? No      Pain Assessment   Currently in Pain? No/denies                Social History   Tobacco Use  Smoking Status Former   Packs/day: 0.00   Years: 0.00   Pack years: 0.00   Types: Pipe, Cigarettes   Quit date: 12/26/2000   Years since quitting: 20.4  Smokeless Tobacco Current   Types: Chew  Tobacco Comments   quit over 30 years ago    Goals Met:  Independence with exercise equipment Exercise tolerated well No report of concerns or symptoms today Strength training completed today  Goals Unmet:  Not Applicable  Comments: Pt able to follow exercise prescription today without complaint.  Will continue to monitor for progression.    Dr. Emily Filbert is Medical Director for [place].  Dr. Ottie Glazier is Medical Director for [facility] Pulmonary Rehabilitation.

## 2021-06-26 ENCOUNTER — Encounter: Payer: Medicare Other | Admitting: *Deleted

## 2021-06-26 DIAGNOSIS — Z955 Presence of coronary angioplasty implant and graft: Secondary | ICD-10-CM

## 2021-06-26 DIAGNOSIS — I214 Non-ST elevation (NSTEMI) myocardial infarction: Secondary | ICD-10-CM

## 2021-06-26 NOTE — Progress Notes (Signed)
Daily Session Note  Patient Details  Name: Shawn Andrews MRN: [id number] Date of Birth: Nov 17, 1928 Referring Provider:   Flowsheet Row Cardiac Rehab from 04/03/2021 in [facility] Cardiac and Pulmonary Rehab  Referring Provider Serafina Royals MD       Encounter Date: 06/26/2021  Check In:  Session Check In - 06/26/21 0929       Check-In   Supervising physician immediately available to respond to emergencies See telemetry face sheet for immediately available ER MD    Location [facility]-Cardiac & Pulmonary Rehab    Staff Present Nyoka Cowden, RN, BSN, Jennye Moccasin, MPA, Mauricia Area, BS, ACSM CEP, Exercise Physiologist    Virtual Visit No    Medication changes reported     No    Fall or balance concerns reported    No    Tobacco Cessation No Change    Warm-up and Cool-down Performed on first and last piece of equipment    Resistance Training Performed Yes    VAD Patient? No    PAD/SET Patient? No      Pain Assessment   Currently in Pain? No/denies                Social History   Tobacco Use  Smoking Status Former   Packs/day: 0.00   Years: 0.00   Pack years: 0.00   Types: Pipe, Cigarettes   Quit date: 12/26/2000   Years since quitting: 20.5  Smokeless Tobacco Current   Types: Chew  Tobacco Comments   quit over 30 years ago    Goals Met:  Independence with exercise equipment Exercise tolerated well No report of concerns or symptoms today  Goals Unmet:  Not Applicable  Comments: Pt able to follow exercise prescription today without complaint.  Will continue to monitor for progression.    Dr. Emily Filbert is Medical Director for [place].  Dr. Ottie Glazier is Medical Director for [facility] Pulmonary Rehabilitation.

## 2021-06-28 DIAGNOSIS — I214 Non-ST elevation (NSTEMI) myocardial infarction: Secondary | ICD-10-CM

## 2021-06-28 DIAGNOSIS — Z955 Presence of coronary angioplasty implant and graft: Secondary | ICD-10-CM

## 2021-06-28 NOTE — Progress Notes (Signed)
Daily Session Note  Patient Details  Name: Shawn Andrews MRN: [id number] Date of Birth: [birth year] Referring Provider:   Flowsheet Row Cardiac Rehab from 04/03/2021 in [facility] Cardiac and Pulmonary Rehab  Referring Provider Serafina Royals MD       Encounter Date: 06/28/2021  Check In:  Session Check In - 06/28/21 0928       Check-In   Supervising physician immediately available to respond to emergencies See telemetry face sheet for immediately available ER MD    Location [facility]-Cardiac & Pulmonary Rehab    Staff Present Coralie Keens, MS, ASCM CEP, Exercise Physiologist;Gwenith Tschida Rosalia Hammers, MPA, Mauricia Area, BS, ACSM CEP, Exercise Physiologist    Virtual Visit No    Medication changes reported     No    Fall or balance concerns reported    No    Tobacco Cessation No Change    Warm-up and Cool-down Performed on first and last piece of equipment    Resistance Training Performed Yes    VAD Patient? No    PAD/SET Patient? No      Pain Assessment   Currently in Pain? No/denies                Social History   Tobacco Use  Smoking Status Former   Packs/day: 0.00   Years: 0.00   Total pack years: 0.00   Types: Pipe, Cigarettes   Quit date: 12/26/2000   Years since quitting: 20.5  Smokeless Tobacco Current   Types: Chew  Tobacco Comments   quit over 30 years ago    Goals Met:  Independence with exercise equipment Exercise tolerated well No report of concerns or symptoms today Strength training completed today  Goals Unmet:  Not Applicable  Comments: Pt able to follow exercise prescription today without complaint.  Will continue to monitor for progression.    Dr. Emily Filbert is Medical Director for [place].  Dr. Ottie Glazier is Medical Director for [facility] Pulmonary Rehabilitation.

## 2021-07-03 ENCOUNTER — Encounter: Payer: Medicare Other | Admitting: *Deleted

## 2021-07-03 DIAGNOSIS — Z955 Presence of coronary angioplasty implant and graft: Secondary | ICD-10-CM

## 2021-07-03 DIAGNOSIS — I214 Non-ST elevation (NSTEMI) myocardial infarction: Secondary | ICD-10-CM | POA: Diagnosis not present

## 2021-07-03 NOTE — Progress Notes (Unsigned)
Suprapubic Cath Change  Patient is present today for a suprapubic catheter change due to urinary retention.  8 ml of water was drained from the balloon, a 16 FR foley cath was removed from the tract with out difficulty.  Site was cleaned and prepped in a sterile fashion with betadine.  A 16 FR foley cath was replaced into the tract no complications were noted. Urine return was noted, 10 ml of sterile water was inflated into the balloon and a leg bag was attached for drainage.  Patient tolerated well. A night bag was given to patient and proper instruction was given on how to switch bags.    Performed by: Nevaya Nagele, PA-C   Follow up: 6 weeks for SPT exchange  

## 2021-07-03 NOTE — Progress Notes (Signed)
Daily Session Note  Patient Details  Name: Shawn Andrews MRN: [id number] Date of Birth: 1928-04-23 Referring Provider:   Flowsheet Row Cardiac Rehab from 04/03/2021 in [facility] Cardiac and Pulmonary Rehab  Referring Provider Serafina Royals MD       Encounter Date: 07/03/2021  Check In:  Session Check In - 07/03/21 0957       Check-In   Supervising physician immediately available to respond to emergencies See telemetry face sheet for immediately available ER MD    Location [facility]-Cardiac & Pulmonary Rehab    Staff Present Heath Lark, RN, BSN, CCRP;Melissa [place], RDN, Wilhelmina Mcardle, BS, ACSM CEP, Exercise Physiologist    Virtual Visit No    Medication changes reported     No    Fall or balance concerns reported    No    Warm-up and Cool-down Performed on first and last piece of equipment    Resistance Training Performed Yes    VAD Patient? No    PAD/SET Patient? No      Pain Assessment   Currently in Pain? No/denies                Social History   Tobacco Use  Smoking Status Former   Packs/day: 0.00   Years: 0.00   Total pack years: 0.00   Types: Pipe, Cigarettes   Quit date: 12/26/2000   Years since quitting: 20.5  Smokeless Tobacco Current   Types: Chew  Tobacco Comments   quit over 30 years ago    Goals Met:  Independence with exercise equipment Exercise tolerated well No report of concerns or symptoms today  Goals Unmet:  Not Applicable  Comments: Pt able to follow exercise prescription today without complaint.  Will continue to monitor for progression.    Dr. Emily Filbert is Medical Director for [place].  Dr. Ottie Glazier is Medical Director for [facility] Pulmonary Rehabilitation.

## 2021-07-04 ENCOUNTER — Ambulatory Visit: Payer: Medicare Other | Admitting: Urology

## 2021-07-04 ENCOUNTER — Encounter: Payer: Self-pay | Admitting: Urology

## 2021-07-04 ENCOUNTER — Encounter: Payer: Self-pay | Admitting: *Deleted

## 2021-07-04 VITALS — BP 94/50 | HR 67 | Temp 98.0°F | Ht 70.0 in | Wt 177.0 lb

## 2021-07-04 DIAGNOSIS — Z9359 Other cystostomy status: Secondary | ICD-10-CM | POA: Diagnosis not present

## 2021-07-04 DIAGNOSIS — Z955 Presence of coronary angioplasty implant and graft: Secondary | ICD-10-CM

## 2021-07-04 DIAGNOSIS — R339 Retention of urine, unspecified: Secondary | ICD-10-CM

## 2021-07-04 DIAGNOSIS — I214 Non-ST elevation (NSTEMI) myocardial infarction: Secondary | ICD-10-CM

## 2021-07-04 NOTE — Progress Notes (Signed)
Cardiac Individual Treatment Plan  Patient Details  Name: Shawn Andrews MRN: [id number] Date of Birth: Sep 29, 1928 Referring Provider:   Flowsheet Row Cardiac Rehab from 04/03/2021 in [facility] Cardiac and Pulmonary Rehab  Referring Provider Serafina Royals MD       Initial Encounter Date:  Flowsheet Row Cardiac Rehab from 04/03/2021 in [facility] Cardiac and Pulmonary Rehab  Date 04/03/21       Visit Diagnosis: NSTEMI (non-ST elevated myocardial infarction) [facility])  Status post coronary artery stent placement  Patient's Home Medications on Admission:  Current Outpatient Medications:    acetaminophen (TYLENOL 8 HOUR) 650 MG CR tablet, Take 1 tablet (650 mg total) by mouth 2 (two) times a day., Disp: , Rfl:    amLODipine (NORVASC) 2.5 MG tablet, Take 1 tablet (2.5 mg total) by mouth daily. (Patient not taking: Reported on 03/16/2021), Disp: 30 tablet, Rfl: 12   atorvastatin (LIPITOR) 40 MG tablet, Take 40 mg by mouth daily. (Patient not taking: Reported on 03/16/2021), Disp: , Rfl:    Cholecalciferol (VITAMIN D3) 25 MCG (1000 UT) CAPS, Take 1 capsule (1,000 Units total) by mouth daily., Disp: 30 capsule, Rfl:    clopidogrel (PLAVIX) 75 MG tablet, Take 1 tablet by mouth daily., Disp: , Rfl:    Coenzyme Q10 (CO Q-10) 100 MG CAPS, Take 1 capsule by mouth daily. (Patient not taking: Reported on 02/25/2021), Disp: , Rfl: 0   cyanocobalamin 1000 MCG tablet, Take 1,000 mcg by mouth daily., Disp: , Rfl:    docusate sodium (COLACE) 100 MG capsule, Take 1 capsule (100 mg total) by mouth daily., Disp: , Rfl:    doxazosin (CARDURA) 4 MG tablet, TAKE 1 TABLET BY MOUTH  DAILY, Disp: 90 tablet, Rfl: 3   famotidine (PEPCID) 20 MG tablet, TAKE 1 TABLET BY MOUTH AT  BEDTIME, Disp: 90 tablet, Rfl: 3   finasteride (PROSCAR) 5 MG tablet, TAKE 1 TABLET BY MOUTH  DAILY, Disp: 90 tablet, Rfl: 3   furosemide (LASIX) 20 MG tablet, Take 1 tablet by mouth every other day., Disp: , Rfl:    isosorbide mononitrate (IMDUR) 30 MG 24  hr tablet, Take 1 tablet by mouth daily., Disp: , Rfl:    loratadine (CLARITIN) 10 MG tablet, Take 1 tablet (10 mg total) by mouth daily as needed for allergies., Disp: 30 tablet, Rfl:    losartan (COZAAR) 50 MG tablet, Take 25 mg by mouth daily., Disp: , Rfl:    metoprolol tartrate (LOPRESSOR) 50 MG tablet, Take 0.5 tablets (25 mg total) by mouth 2 (two) times daily., Disp: , Rfl:    nitroGLYCERIN (NITROSTAT) 0.4 MG SL tablet, Place under the tongue., Disp: , Rfl:    polyethylene glycol powder (GLYCOLAX/MIRALAX) 17 GM/SCOOP powder, Take 8.5-17 g by mouth daily as needed for moderate constipation., Disp: 3350 g, Rfl: 0   primidone (MYSOLINE) 50 MG tablet, Take 2 tablets (100 mg total) by mouth 3 (three) times daily., Disp: , Rfl:    rosuvastatin (CRESTOR) 5 MG tablet, Take 5 mg by mouth daily., Disp: , Rfl:    vitamin B-12 (CYANOCOBALAMIN) 500 MCG tablet, Take 1 tablet (500 mcg total) by mouth once a week., Disp: , Rfl:   Past Medical History: Past Medical History:  Diagnosis Date   Abnormal drug screen 06/22/2014   inapprop neg xanax rpt 3 mo (06/2014)   BPH (benign prostatic hypertrophy) 01/21/1998   has had 3 biopsies in past ([place]) decided to stop PSA/DRE   CHF (congestive heart failure) ([place])  Coronary artery disease    Hyperlipidemia 01/21/2002   Hypertension 05/22/2003   Left lumbar radiculopathy    Osteoarthritis 01/22/1988   knees, lumbar spondylosis and listhesis    Tobacco Use: Social History   Tobacco Use  Smoking Status Former   Packs/day: 0.00   Years: 0.00   Total pack years: 0.00   Types: Pipe, Cigarettes   Quit date: 12/26/2000   Years since quitting: 20.5  Smokeless Tobacco Current   Types: Chew  Tobacco Comments   quit over 30 years ago    Labs: Review Flowsheet  More data exists      Latest Ref Rng & Units 08/18/2018 08/25/2019 08/25/2020 01/17/2021  Labs for ITP Cardiac and Pulmonary Rehab  Cholestrol 0 - 200 mg/dL 190  202  212  -  LDL (calc) 0 -  99 mg/dL 129  134  138  -  HDL-C >40 mg/dL 43.50  45.80  49.80  -  Trlycerides <150 mg/dL 88.0  110.0  119.0  -  Hemoglobin A1c 4.8 - 5.6 % - - - 5.0       01/18/2021  Labs for ITP Cardiac and Pulmonary Rehab  Cholestrol 157   LDL (calc) 99   HDL-C 45   Trlycerides 66   Hemoglobin A1c -     Exercise Target Goals: Exercise Program Goal: Individual exercise prescription set using results from initial 6 min walk test and THRR while considering  patient's activity barriers and safety.   Exercise Prescription Goal: Initial exercise prescription builds to 30-45 minutes a day of aerobic activity, 2-3 days per week.  Home exercise guidelines will be given to patient during program as part of exercise prescription that the participant will acknowledge.   Education: Aerobic Exercise: - Group verbal and visual presentation on the components of exercise prescription. Introduces F.I.T.T principle from ACSM for exercise prescriptions.  Reviews F.I.T.T. principles of aerobic exercise including progression. Written material given at graduation. Flowsheet Row Cardiac Rehab from 04/03/2021 in [facility] Cardiac and Pulmonary Rehab  Education need identified 04/03/21       Education: Resistance Exercise: - Group verbal and visual presentation on the components of exercise prescription. Introduces F.I.T.T principle from ACSM for exercise prescriptions  Reviews F.I.T.T. principles of resistance exercise including progression. Written material given at graduation.    Education: Exercise & Equipment Safety: - Individual verbal instruction and demonstration of equipment use and safety with use of the equipment. Flowsheet Row Cardiac Rehab from 04/03/2021 in [facility] Cardiac and Pulmonary Rehab  Education need identified 04/03/21  Date 04/03/21  Educator O[place]  Instruction Review Code 1- Verbalizes Understanding       Education: Exercise Physiology & General Exercise Guidelines: - Group verbal and written  instruction with models to review the exercise physiology of the cardiovascular system and associated critical values. Provides general exercise guidelines with specific guidelines to those with heart or lung disease.  Flowsheet Row Cardiac Rehab from 04/03/2021 in [facility] Cardiac and Pulmonary Rehab  Education need identified 04/03/21       Education: Flexibility, Balance, Mind/Body Relaxation: - Group verbal and visual presentation with interactive activity on the components of exercise prescription. Introduces F.I.T.T principle from ACSM for exercise prescriptions. Reviews F.I.T.T. principles of flexibility and balance exercise training including progression. Also discusses the mind body connection.  Reviews various relaxation techniques to help reduce and manage stress (i.e. Deep breathing, progressive muscle relaxation, and visualization). Balance handout provided to take home. Written material given at graduation.   Activity Barriers &  Risk Stratification:  Activity Barriers & Cardiac Risk Stratification - 04/03/21 0943       Activity Barriers & Cardiac Risk Stratification   Activity Barriers Deconditioning;Decreased Ventricular Function    Comments Suprapubic catheter- reports no limitation or problems with movement or activity    Cardiac Risk Stratification High             6 Minute Walk:  6 Minute Walk     Row Name 04/03/21 0944         6 Minute Walk   Phase Initial     Distance 1040 feet     Walk Time 6 minutes     # of Rest Breaks 0     MPH 1.96     METS 1.41     RPE 9     Perceived Dyspnea  0     VO2 Peak 4.93     Symptoms No     Resting HR 65 bpm     Resting BP 102/56     Resting Oxygen Saturation  98 %     Exercise Oxygen Saturation  during 6 min walk 94 %     Max Ex. HR 84 bpm     Max Ex. BP 132/62     2 Minute Post BP 100/58              Oxygen Initial Assessment:   Oxygen Re-Evaluation:   Oxygen Discharge (Final Oxygen  Re-Evaluation):   Initial Exercise Prescription:  Initial Exercise Prescription - 04/03/21 1400       Date of Initial Exercise RX and Referring Provider   Date 04/03/21    Referring Provider Serafina Royals MD      Oxygen   Maintain Oxygen Saturation 88% or higher      Treadmill   MPH 1.8    Grade 0.5    Minutes 15    METs 2.5      NuStep   Level 1    SPM 80    Minutes 15    METs 1.4      REL-XR   Level 1    Speed 50    Minutes 15    METs 1.4      Prescription Details   Frequency (times per week) 2-3   TBD   Duration Progress to 30 minutes of continuous aerobic without signs/symptoms of physical distress      Intensity   THRR 40-80% of Max Heartrate 91-117    Ratings of Perceived Exertion 11-13    Perceived Dyspnea 0-4      Progression   Progression Continue to progress workloads to maintain intensity without signs/symptoms of physical distress.      Resistance Training   Training Prescription Yes    Weight 3 lb    Reps 10-15             Perform Capillary Blood Glucose checks as needed.  Exercise Prescription Changes:   Exercise Prescription Changes     Row Name 04/03/21 1400 06/04/21 0800 06/05/21 0900 06/19/21 1600 07/02/21 1500     Response to Exercise   Blood Pressure (Admit) 102/56 112/58 -- 108/62 122/58   Blood Pressure (Exercise) 132/62 122/76 -- 124/60 --   Blood Pressure (Exit) 100/58 124/60 -- 126/64 112/60   Heart Rate (Admit) 65 bpm 75 bpm -- 63 bpm 71 bpm   Heart Rate (Exercise) 84 bpm 77 bpm -- 97 bpm 87 bpm   Heart Rate (Exit) 66 bpm 65 bpm --  68 bpm 69 bpm   Oxygen Saturation (Admit) 98 % -- -- -- --   Oxygen Saturation (Exercise) 94 % -- -- -- --   Oxygen Saturation (Exit) 96 % -- -- -- --   Rating of Perceived Exertion (Exercise) 9 13 -- 13 12   Perceived Dyspnea (Exercise) 0 -- -- -- --   Symptoms none none -- none none   Comments walk test results second full day of exercise -- -- --   Duration -- Progress to 30 minutes  of  aerobic without signs/symptoms of physical distress -- Continue with 30 min of aerobic exercise without signs/symptoms of physical distress. Continue with 30 min of aerobic exercise without signs/symptoms of physical distress.   Intensity -- THRR unchanged -- THRR unchanged THRR unchanged     Progression   Progression -- Continue to progress workloads to maintain intensity without signs/symptoms of physical distress. -- Continue to progress workloads to maintain intensity without signs/symptoms of physical distress. Continue to progress workloads to maintain intensity without signs/symptoms of physical distress.   Average METs -- 2.29 -- 2.86 3.26     Resistance Training   Training Prescription -- Yes -- Yes Yes   Weight -- 3 lb -- 3 lb 6 lb   Reps -- 10-15 -- 10-15 10-15     Interval Training   Interval Training -- No -- No No     Treadmill   MPH -- 1.9 -- 2.1 2.1   Grade -- 0.5 -- 0.5 0.5   Minutes -- 15 -- 15 15   METs -- 259 -- 2.75 2.75     Recumbant Bike   Level -- -- -- 1 3   Watts -- -- -- -- 27   Minutes -- -- -- 15 15   METs -- -- -- 1.9 2.98     Arm Ergometer   Level -- 1 -- -- --   Minutes -- 15 -- -- --     REL-XR   Level -- 3 -- -- 1   Minutes -- 15 -- -- 15   METs -- -- -- -- 5.2     T5 Nustep   Level -- -- -- 3 3   Minutes -- -- -- 15 15   METs -- -- -- 2.4 2.4     Biostep-RELP   Level -- -- -- 2 2   Minutes -- -- -- 15 15   METs -- -- -- 3 3     Track   Laps -- -- -- 18 --   Minutes -- 15 -- 15 --   METs -- -- -- 1.98 --     Home Exercise Plan   Plans to continue exercise at -- -- [employer] (comment)  [employer] (treadmill, bike, [employer] machines) Forensic scientist (comment)  [employer] (treadmill, bike, [employer]) Forensic scientist (comment)  [employer] (treadmill, bike, [employer])   Frequency -- -- Add 3 additional days to program exercise sessions. Add 3 additional days to program exercise sessions. Add 3 additional days to program  exercise sessions.   Initial Home Exercises Provided -- -- 06/05/21 06/05/21 06/05/21     Oxygen   Maintain Oxygen Saturation -- 88% or higher 88% or higher 88% or higher 88% or higher            Exercise Comments:   Exercise Comments     Row Name 05/29/21 0934           Exercise Comments First full day of  exercise!  Patient was oriented to gym and equipment including functions, settings, policies, and procedures.  Patient's individual exercise prescription and treatment plan were reviewed.  All starting workloads were established based on the results of the 6 minute walk test done at initial orientation visit.  The plan for exercise progression was also introduced and progression will be customized based on patient's performance and goals.                Exercise Goals and Review:   Exercise Goals     Row Name 04/03/21 0945             Exercise Goals   Increase Physical Activity Yes       Intervention Provide advice, education, support and counseling about physical activity/exercise needs.;Develop an individualized exercise prescription for aerobic and resistive training based on initial evaluation findings, risk stratification, comorbidities and participant's personal goals.       Expected Outcomes Short Term: Attend rehab on a regular basis to increase amount of physical activity.;Long Term: Add in home exercise to make exercise part of routine and to increase amount of physical activity.;Long Term: Exercising regularly at least 3-5 days a week.       Increase Strength and Stamina Yes       Intervention Provide advice, education, support and counseling about physical activity/exercise needs.;Develop an individualized exercise prescription for aerobic and resistive training based on initial evaluation findings, risk stratification, comorbidities and participant's personal goals.       Expected Outcomes Short Term: Increase workloads from initial exercise prescription  for resistance, speed, and METs.;Short Term: Perform resistance training exercises routinely during rehab and add in resistance training at home;Long Term: Improve cardiorespiratory fitness, muscular endurance and strength as measured by increased METs and functional capacity (6MWT)       Able to understand and use rate of perceived exertion (RPE) scale Yes       Intervention Provide education and explanation on how to use RPE scale       Expected Outcomes Short Term: Able to use RPE daily in rehab to express subjective intensity level;Long Term:  Able to use RPE to guide intensity level when exercising independently       Able to understand and use Dyspnea scale Yes       Intervention Provide education and explanation on how to use Dyspnea scale       Expected Outcomes Short Term: Able to use Dyspnea scale daily in rehab to express subjective sense of shortness of breath during exertion;Long Term: Able to use Dyspnea scale to guide intensity level when exercising independently       Knowledge and understanding of Target Heart Rate Range (THRR) Yes       Intervention Provide education and explanation of THRR including how the numbers were predicted and where they are located for reference       Expected Outcomes Short Term: Able to use daily as guideline for intensity in rehab;Short Term: Able to state/look up THRR;Long Term: Able to use THRR to govern intensity when exercising independently       Able to check pulse independently Yes       Intervention Provide education and demonstration on how to check pulse in carotid and radial arteries.;Review the importance of being able to check your own pulse for safety during independent exercise       Expected Outcomes Short Term: Able to explain why pulse checking is important during independent exercise;Long Term: Able to  check pulse independently and accurately       Understanding of Exercise Prescription Yes       Intervention Provide education,  explanation, and written materials on patient's individual exercise prescription       Expected Outcomes Short Term: Able to explain program exercise prescription;Long Term: Able to explain home exercise prescription to exercise independently                Exercise Goals Re-Evaluation :  Exercise Goals Re-Evaluation     Row Name 05/29/21 0934 06/04/21 0849 06/05/21 0949 06/19/21 0947 06/19/21 1631     Exercise Goal Re-Evaluation   Exercise Goals Review Increase Physical Activity;Able to understand and use rate of perceived exertion (RPE) scale;Understanding of Exercise Prescription;Knowledge and understanding of Target Heart Rate Range (THRR);Increase Strength and Stamina;Able to understand and use Dyspnea scale;Able to check pulse independently Increase Physical Activity;Increase Strength and Stamina;Understanding of Exercise Prescription Increase Physical Activity;Increase Strength and Stamina;Understanding of Exercise Prescription Increase Physical Activity;Increase Strength and Stamina;Understanding of Exercise Prescription Increase Physical Activity;Increase Strength and Stamina;Understanding of Exercise Prescription   Comments Reviewed RPE and dyspnea scales, THR and program prescription with pt today.  Pt voiced understanding and was given a copy of goals to take home. Waylyn is off to a good start in rehab.  He has completed his first two full days of rehab.  We will continue to montior his progress. Reviewed home exercise with pt today.  Pt plans to workout at the [facility] for exercise. He is already exercising on the bike, treadmill, and doing weight machines. Reviewed THR, pulse, RPE, sign and symptoms, pulse oximetery and when to call 911 or MD.  Also discussed weather considerations and indoor options.  Pt voiced understanding. Bashir is doing well in rehab.  He feels like his strength is starting to come back. He has been going to the [facility] for exercise on his off days.  He is also staying  active at home with chores his wifes has for him. He is able to do them now without running out of stamina. Starlin is continuing to do well in rehab. He has increased his TM speed to 2.1 mph and is already at level 3 on the T5 Nustep. All RPEs are in appropriate range. He would benefit from increasing his level on the recumbant bike. Will continue to monitor.   Expected Outcomes Short: Use RPE daily to regulate intensity. Long: Follow program prescription in THR. Short: Count laps on track and attend regularly  Long: Continue to follow program prescription Short: continue to monitor HR using THR Long: Continue to exercise at home independently Short; Continue to go to [facility] on off days Long: Continue to improve stamina Short: Increase level on RB Long: Continue to increase overall MET level    Row Name 07/02/21 1505             Exercise Goal Re-Evaluation   Exercise Goals Review Increase Physical Activity;Increase Strength and Stamina;Understanding of Exercise Prescription       Comments Kenaz is doing well in rehab.  He is up to 27 watts on the bike and using 6 lb hand weights.  We will continue to monitor his progress.       Expected Outcomes Short: Back up to level 4 on T5 Long; Conitnue to improve stamina                Discharge Exercise Prescription (Final Exercise Prescription Changes):  Exercise Prescription Changes - 07/02/21 1500  Response to Exercise   Blood Pressure (Admit) 122/58    Blood Pressure (Exit) 112/60    Heart Rate (Admit) 71 bpm    Heart Rate (Exercise) 87 bpm    Heart Rate (Exit) 69 bpm    Rating of Perceived Exertion (Exercise) 12    Symptoms none    Duration Continue with 30 min of aerobic exercise without signs/symptoms of physical distress.    Intensity THRR unchanged      Progression   Progression Continue to progress workloads to maintain intensity without signs/symptoms of physical distress.    Average METs 3.26      Resistance Training    Training Prescription Yes    Weight 6 lb    Reps 10-15      Interval Training   Interval Training No      Treadmill   MPH 2.1    Grade 0.5    Minutes 15    METs 2.75      Recumbant Bike   Level 3    Watts 27    Minutes 15    METs 2.98      REL-XR   Level 1    Minutes 15    METs 5.2      T5 Nustep   Level 3    Minutes 15    METs 2.4      Biostep-RELP   Level 2    Minutes 15    METs 3      Home Exercise Plan   Plans to continue exercise at [employer] (comment)   [employer] (treadmill, bike, weight machines)   Frequency Add 3 additional days to program exercise sessions.    Initial Home Exercises Provided 06/05/21      Oxygen   Maintain Oxygen Saturation 88% or higher             Nutrition:  Target Goals: Understanding of nutrition guidelines, daily intake of sodium <1575m, cholesterol <2075m calories 30% from fat and 7% or less from saturated fats, daily to have 5 or more servings of fruits and vegetables.  Education: All About Nutrition: -Group instruction provided by verbal, written material, interactive activities, discussions, models, and posters to present general guidelines for heart healthy nutrition including fat, fiber, MyPlate, the role of sodium in heart healthy nutrition, utilization of the nutrition label, and utilization of this knowledge for meal planning. Follow up email sent as well. Written material given at graduation. [facility]  Education need identified 04/03/21       Biometrics:  Pre Biometrics - 04/03/21 0943       Pre Biometrics   Height 5' 10.5" (1.791 m)    Weight 178 lb (80.7 kg)    BMI (Calculated) 25.17    Single Leg Stand 1.94 seconds              Nutrition Therapy Plan and Nutrition Goals:  Nutrition Therapy & Goals - 05/29/21 0955       Nutrition Therapy   RD appointment deferred Yes   Pt would not like to meet with RD as he feels his  wife has him on a good diet. Will continue to follow up.     Personal Nutrition Goals   Nutrition Goal Pt would not like to meet with RD as he feels his wife has him on a good diet. Will continue to follow up.  Nutrition Assessments:  MEDIFICTS Score Key: ?70 Need to make dietary changes  40-70 Heart Healthy Diet ? 40 Therapeutic Level Cholesterol Diet  Flowsheet Row Cardiac Rehab from 04/03/2021 in [facility] Cardiac and Pulmonary Rehab  Picture Your Plate Total Score on Admission 86      Picture Your Plate Scores: <68 Unhealthy dietary pattern with much room for improvement. 41-50 Dietary pattern unlikely to meet recommendations for good health and room for improvement. 51-60 More healthful dietary pattern, with some room for improvement.  >60 Healthy dietary pattern, although there may be some specific behaviors that could be improved.    Nutrition Goals Re-Evaluation:  Nutrition Goals Re-Evaluation     [place] Name 06/19/21 [id number]             Goals   Nutrition Goal Pt would not like to meet with RD as he feels his wife has him on a good diet. Will continue to follow up.       Comment Anmol is doing well in rehab.  He feels that he and his wife are following a good diet.  He says that she keeps him on a strict healthy diet to keep him good.  They stay away from salt and she has broken his habit of using salt.  He has backed off sugary sweets.  At one point, he was over 200 lb, but they have worked on it.  They eat a lot of chicken and vegetables.  They are enjoying the fruits coming into season now.       Expected Outcome Short: Continue to get a good variety Long; Continue to focus on heart healthy eating.                Nutrition Goals Discharge (Final Nutrition Goals Re-Evaluation):  Nutrition Goals Re-Evaluation - 06/19/21 0951       Goals   Nutrition Goal Pt would not like to meet with RD as he feels his wife has him on a good diet. Will continue to follow  up.    Comment Rydan is doing well in rehab.  He feels that he and his wife are following a good diet.  He says that she keeps him on a strict healthy diet to keep him good.  They stay away from salt and she has broken his habit of using salt.  He has backed off sugary sweets.  At one point, he was over 200 lb, but they have worked on it.  They eat a lot of chicken and vegetables.  They are enjoying the fruits coming into season now.    Expected Outcome Short: Continue to get a good variety Long; Continue to focus on heart healthy eating.             Psychosocial: Target Goals: Acknowledge presence or absence of significant depression and/or stress, maximize coping skills, provide positive support system. Participant is able to verbalize types and ability to use techniques and skills needed for reducing stress and depression.   Education: Stress, Anxiety, and Depression - Group verbal and visual presentation to define topics covered.  Reviews how body is impacted by stress, anxiety, and depression.  Also discusses healthy ways to reduce stress and to treat/manage anxiety and depression.  Written material given at graduation.   Education: Sleep Hygiene -Provides group verbal and written instruction about how sleep can affect your health.  Define sleep hygiene, discuss sleep cycles and impact of sleep habits. Review good sleep hygiene tips.    Initial  Review & Psychosocial Screening:  Initial Psych Review & Screening - 03/21/21 1412       Initial Review   Current issues with None Identified      Family Dynamics   Good Support System? Yes   wife     Barriers   Psychosocial barriers to participate in program There are no identifiable barriers or psychosocial needs.      Screening Interventions   Interventions Encouraged to exercise;To provide support and resources with identified psychosocial needs    Expected Outcomes Long Term Goal: Stressors or current issues are controlled or  eliminated.;Short Term goal: Utilizing psychosocial counselor, staff and physician to assist with identification of specific Stressors or current issues interfering with healing process. Setting desired goal for each stressor or current issue identified.;Short Term goal: Identification and review with participant of any Quality of Life or Depression concerns found by scoring the questionnaire.;Long Term goal: The participant improves quality of Life and PHQ9 Scores as seen by post scores and/or verbalization of changes             Quality of Life Scores:   Quality of Life - 04/03/21 0933       Quality of Life   Select Quality of Life      Quality of Life Scores   Health/Function Pre 26.57 %    Socioeconomic Pre 25.63 %    Psych/Spiritual Pre 29.64 %    Family Pre 28.8 %    GLOBAL Pre 27.29 %            Scores of 19 and below usually indicate a poorer quality of life in these areas.  A difference of  2-3 points is a clinically meaningful difference.  A difference of 2-3 points in the total score of the Quality of Life Index has been associated with significant improvement in overall quality of life, self-image, physical symptoms, and general health in studies assessing change in quality of life.  PHQ-9: Review Flowsheet  More data exists      04/03/2021 03/16/2021 02/01/2021 08/30/2020 08/25/2019  Depression screen PHQ 2/9  Decreased Interest 0 0 0 0 0  Down, Depressed, Hopeless 0 0 0 0 0  PHQ - 2 Score 0 0 0 0 0  Altered sleeping 1 - - - -  Tired, decreased energy 1 - - - -  Change in appetite 0 - - - -  Feeling bad or failure about yourself  0 - - - -  Trouble concentrating 0 - - - -  Moving slowly or fidgety/restless 0 - - - -  Suicidal thoughts 0 - - - -  PHQ-9 Score 2 - - - -  Difficult doing work/chores Not difficult at all - - - -   Interpretation of Total Score  Total Score Depression Severity:  1-4 = Minimal depression, 5-9 = Mild depression, 10-14 = Moderate  depression, 15-19 = Moderately severe depression, 20-27 = Severe depression   Psychosocial Evaluation and Intervention:  Psychosocial Evaluation - 03/21/21 1412       Psychosocial Evaluation & Interventions   Comments Mr. Rood wants to come in person to see the gym and to see if this is something he is interested in. [facility], and lift some weights. He is looking to get back into that routine but doesn't know if he needs this program specifically. He has had recent hospital admissions related to urinary retention and recently  had a suprapubic catheter placed per notes. He states he has been feeling well. His wife helps manage his health care.    Expected Outcomes Short: decide on whether to attend cardiac rehab or [facility]. Long: maintain positive self care habits.    Continue Psychosocial Services  Follow up required by staff             Psychosocial Re-Evaluation:  Psychosocial Re-Evaluation     [place] Name 06/19/21 [phone]             Psychosocial Re-Evaluation   Current issues with None Identified       Comments Tatem is doing well in rehba.  He feels good mentally.  His finances are in check and he does not feel like he has any major stressors currently.  He is sleeping good.  He occasionnaly will have rough nights, but overall sleeps good.  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  He exercises routinely for mental boost. He has enjoyed being supervised again to help move up more.       Expected Outcomes Short: Continue to exercise for mental boost Long: continue to stay positive       Interventions Encouraged to attend Cardiac Rehabilitation for the exercise       Continue Psychosocial Services  Follow up required by staff                Psychosocial Discharge (Final Psychosocial Re-Evaluation):  Psychosocial Re-Evaluation - 06/19/21 0948        Psychosocial Re-Evaluation   Current issues with None Identified    Comments Stephanos is doing well in [place].  He feels good mentally.  His finances are in check and he does not feel like he has any major stressors currently.  He is sleeping good.  He occasionnaly will have rough nights, but overall sleeps good.  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  He exercises routinely for mental boost. He has enjoyed being supervised again to help move up more.    Expected Outcomes Short: Continue to exercise for mental boost Long: continue to stay positive    Interventions Encouraged to attend Cardiac Rehabilitation for the exercise    Continue Psychosocial Services  Follow up required by staff             Vocational Rehabilitation: Provide vocational rehab assistance to qualifying candidates.   Vocational Rehab Evaluation & Intervention:  Vocational Rehab - 03/21/21 1412       Initial Vocational Rehab Evaluation & Intervention   Assessment shows need for Vocational Rehabilitation No             Education: Education Goals: Education classes will be provided on a variety of topics geared toward better understanding of heart health and risk factor modification. Participant will state understanding/return demonstration of topics presented as noted by education test scores.  Learning Barriers/Preferences:  Learning Barriers/Preferences - 03/21/21 1412       Learning Barriers/Preferences   Learning Preferences Individual Instruction             General Cardiac Education Topics:  AED/CPR: - Group verbal and written instruction with the use of models to demonstrate the basic use of the AED with the basic ABC's of resuscitation.   Anatomy and Cardiac Procedures: - Group verbal and  visual presentation and models provide information about basic cardiac anatomy and function. Reviews the testing methods  done to diagnose heart disease and the outcomes of the test results. Describes the treatment choices: Medical Management, Angioplasty, or Coronary Bypass Surgery for treating various heart conditions including Myocardial Infarction, Angina, Valve Disease, and Cardiac Arrhythmias.  Written material given at graduation. Flowsheet Row Cardiac Rehab from 04/03/2021 in [facility] Cardiac and Pulmonary Rehab  Education need identified 04/03/21       Medication Safety: - Group verbal and visual instruction to review commonly prescribed medications for heart and lung disease. Reviews the medication, class of the drug, and side effects. Includes the steps to properly store meds and maintain the prescription regimen.  Written material given at graduation.   Intimacy: - Group verbal instruction through game format to discuss how heart and lung disease can affect sexual intimacy. Written material given at graduation..   Know Your Numbers and Heart Failure: - Group verbal and visual instruction to discuss disease risk factors for cardiac and pulmonary disease and treatment options.  Reviews associated critical values for Overweight/Obesity, Hypertension, Cholesterol, and Diabetes.  Discusses basics of heart failure: signs/symptoms and treatments.  Introduces Heart Failure Zone chart for action plan for heart failure.  Written material given at graduation.   Infection Prevention: - Provides verbal and written material to individual with discussion of infection control including proper hand washing and proper equipment cleaning during exercise session. Flowsheet Row Cardiac Rehab from 04/03/2021 in [facility] Cardiac and Pulmonary Rehab  Education need identified 04/03/21  Date 04/03/21  Educator [place]  Instruction Review Code 1- Verbalizes Understanding       Falls Prevention: - Provides verbal and written material to individual with discussion of falls prevention and safety. Flowsheet Row Cardiac Rehab from  04/03/2021 in [facility] Cardiac and Pulmonary Rehab  Education need identified 04/03/21  Date 04/03/21  Educator [place]  Instruction Review Code 1- Verbalizes Understanding       Other: -Provides group and verbal instruction on various topics (see comments)   Knowledge Questionnaire Score:  Knowledge Questionnaire Score - 04/03/21 0935       Knowledge Questionnaire Score   Pre Score 22/26: Exercise, Nutrition, Angina, Nitro             Core Components/Risk Factors/Patient Goals at Admission:  Personal Goals and Risk Factors at Admission - 04/03/21 0945       Core Components/Risk Factors/Patient Goals on Admission    Weight Management Yes;Weight Maintenance    Intervention Weight Management: Develop a combined nutrition and exercise program designed to reach desired caloric intake, while maintaining appropriate intake of nutrient and fiber, sodium and fats, and appropriate energy expenditure required for the weight goal.;Weight Management: Provide education and appropriate resources to help participant work on and attain dietary goals.;Weight Management/Obesity: Establish reasonable short term and long term weight goals.    Admit Weight 178 lb (80.7 kg)    Goal Weight: Short Term 178 lb (80.7 kg)    Goal Weight: Long Term 178 lb (80.7 kg)    Expected Outcomes Short Term: Continue to assess and modify interventions until short term weight is achieved;Long Term: Adherence to nutrition and physical activity/exercise program aimed toward attainment of established weight goal;Weight Maintenance: Understanding of the daily nutrition guidelines, which includes 25-35% calories from fat, 7% or less cal from saturated fats, less than 246m cholesterol, less than 1.5gm of sodium, & 5 or more servings of fruits and vegetables daily;Understanding recommendations for meals to include  15-35% energy as protein, 25-35% energy from fat, 35-60% energy from carbohydrates, less than 275m of dietary cholesterol,  20-35 gm of total fiber daily;Understanding of distribution of calorie intake throughout the day with the consumption of 4-5 meals/snacks    Heart Failure Yes    Intervention Provide a combined exercise and nutrition program that is supplemented with education, support and counseling about heart failure. Directed toward relieving symptoms such as shortness of breath, decreased exercise tolerance, and extremity edema.    Expected Outcomes Improve functional capacity of life;Short term: Attendance in program 2-3 days a week with increased exercise capacity. Reported lower sodium intake. Reported increased fruit and vegetable intake. Reports medication compliance.;Long term: Adoption of self-care skills and reduction of barriers for early signs and symptoms recognition and intervention leading to self-care maintenance.;Short term: Daily weights obtained and reported for increase. Utilizing diuretic protocols set by physician.    Hypertension Yes    Intervention Provide education on lifestyle modifcations including regular physical activity/exercise, weight management, moderate sodium restriction and increased consumption of fresh fruit, vegetables, and low fat dairy, alcohol moderation, and smoking cessation.;Monitor prescription use compliance.    Expected Outcomes Short Term: Continued assessment and intervention until BP is < 140/964mHG in hypertensive participants. < 130/804mG in hypertensive participants with diabetes, heart failure or chronic kidney disease.;Long Term: Maintenance of blood pressure at goal levels.    Lipids Yes    Intervention Provide education and support for participant on nutrition & aerobic/resistive exercise along with prescribed medications to achieve LDL <68m48mDL >40mg63m Expected Outcomes Short Term: Participant states understanding of desired cholesterol values and is compliant with medications prescribed. Participant is following exercise prescription and nutrition  guidelines.;Long Term: Cholesterol controlled with medications as prescribed, with individualized exercise RX and with personalized nutrition plan. Value goals: LDL < 68mg,52m > 40 mg.             Education:Diabetes - Individual verbal and written instruction to review signs/symptoms of diabetes, desired ranges of glucose level fasting, after meals and with exercise. Acknowledge that pre and post exercise glucose checks will be done for 3 sessions at entry of program.   Core Components/Risk Factors/Patient Goals Review:   Goals and Risk Factor Review     Row Name 06/19/21 0953             Core Components/Risk Factors/Patient Goals Review   Personal Goals Review Weight Management/Obesity;Hypertension;Heart Failure       Review Jontae iGiovaniing well in rehab.  His weight is doing well as they continue to eat well. He has not had any heart failure symptoms.  His blood pressures have been doing well in class.  he does not check them at home, unless something is feeling off.       Expected Outcomes [place]: Continue to maintain weight Long: Continue to manage heart failure                Core Components/Risk Factors/Patient Goals at Discharge (Final Review):   Goals and Risk Factor Review - 06/19/21 0953       Core Components/Risk Factors/Patient Goals Review   Personal Goals Review Weight Management/Obesity;Hypertension;Heart Failure    Review Marquel iJasmonding well in rehab.  His weight is doing well as they continue to eat well. He has not had any heart failure symptoms.  His blood pressures have been doing well in class.  he does not check them at home, unless something is feeling off.  Expected Outcomes Shrot: Continue to maintain weight Long: Continue to manage heart failure             ITP Comments:  ITP Comments     Row Name 03/21/21 1411 04/03/21 0930 04/11/21 0727 05/09/21 1220 05/21/21 0841   ITP Comments Initial telephone orientation completed. Diagnosis can be  found in [facility] 9/17. EP orientation scheduled for Tuesday 3/14 at 8am. Completed 6MWT and gym orientation. Initial ITP created and sent for review to Dr. Emily Filbert, Medical Director. 30 Day review completed. Medical Director ITP review done, changes made as directed, and signed approval by Medical Director. 30 Day review completed. Medical Director ITP review done, changes made as directed, and signed approval by Medical Director. Mohsen has not started cardiac rehab yet as he was out for a month. His start date is projected  on 5/9.    [place] Name 05/29/21 [phone] 06/06/21 0804 07/04/21 0711       ITP Comments First full day of exercise!  Patient was oriented to gym and equipment including functions, settings, policies, and procedures.  Patient's individual exercise prescription and treatment plan were reviewed.  All starting workloads were established based on the results of the 6 minute walk test done at initial orientation visit.  The plan for exercise progression was also introduced and progression will be customized based on patient's performance and goals. 30 Day review completed. Medical Director ITP review done, changes made as directed, and signed approval by Medical Director.     New 30 Day review completed. Medical Director ITP review done, changes made as directed, and signed approval by Medical Director.              Comments:

## 2021-07-04 NOTE — Progress Notes (Signed)
30 Day review completed. Medical Director ITP review done, changes made as directed, and signed approval by Medical Director.  

## 2021-07-05 DIAGNOSIS — Z955 Presence of coronary angioplasty implant and graft: Secondary | ICD-10-CM

## 2021-07-05 DIAGNOSIS — I214 Non-ST elevation (NSTEMI) myocardial infarction: Secondary | ICD-10-CM

## 2021-07-05 NOTE — Progress Notes (Signed)
Daily Session Note  Patient Details  Name: Shawn Andrews MRN: [id number] Date of Birth: 1928/01/23 Referring Provider:   Flowsheet Row Cardiac Rehab from 04/03/2021 in [facility] Cardiac and Pulmonary Rehab  Referring Provider Serafina Royals MD       Encounter Date: 07/05/2021  Check In:  Session Check In - 07/05/21 1117       Check-In   Supervising physician immediately available to respond to emergencies See telemetry face sheet for immediately available ER MD    Location [facility]-Cardiac & Pulmonary Rehab    Staff Present Vida Rigger, RN, BSN;Joseph Austin, RCP,RRT,BSRT;Melissa Flint, RDN, LDN;Jessica Bowman, MA, RCEP, CCRP, [place], MS, ASCM CEP, Exercise Physiologist    Virtual Visit No    Medication changes reported     No    Fall or balance concerns reported    No    Tobacco Cessation No Change    Warm-up and Cool-down Performed on first and last piece of equipment    Resistance Training Performed Yes    VAD Patient? No    PAD/SET Patient? No      Pain Assessment   Currently in Pain? No/denies                Social History   Tobacco Use  Smoking Status Former   Packs/day: 0.00   Years: 0.00   Total pack years: 0.00   Types: Pipe, Cigarettes   Quit date: 12/26/2000   Years since quitting: 20.5  Smokeless Tobacco Current   Types: Chew  Tobacco Comments   quit over 30 years ago    Goals Met:  Proper associated with RPD/PD & O2 Sat Independence with exercise equipment Exercise tolerated well No report of concerns or symptoms today Strength training completed today  Goals Unmet:  Not Applicable  Comments: Pt able to follow exercise prescription today without complaint.  Will continue to monitor for progression.   Dr. Emily Filbert is Medical Director for [place].  Dr. Ottie Glazier is Medical Director for [facility] Pulmonary Rehabilitation.

## 2021-07-10 DIAGNOSIS — Z955 Presence of coronary angioplasty implant and graft: Secondary | ICD-10-CM

## 2021-07-10 DIAGNOSIS — I214 Non-ST elevation (NSTEMI) myocardial infarction: Secondary | ICD-10-CM | POA: Diagnosis not present

## 2021-07-10 NOTE — Progress Notes (Signed)
Daily Session Note  Patient Details  Name: Shawn Andrews MRN: [id number] Date of Birth: 19-May-1928 Referring Provider:   Flowsheet Row Cardiac Rehab from 04/03/2021 in [facility] Cardiac and Pulmonary Rehab  Referring Provider Serafina Royals MD       Encounter Date: 07/10/2021  Check In:  Session Check In - 07/10/21 0918       Check-In   Supervising physician immediately available to respond to emergencies See telemetry face sheet for immediately available ER MD    Location [facility]-Cardiac & Pulmonary Rehab    Staff Present Earlean Shawl, BS, ACSM CEP, Exercise Physiologist;Joniyah Mallinger Rosalia Hammers, MPA, RN;Melissa Caiola, RDN, LDN    Virtual Visit No    Medication changes reported     No    Fall or balance concerns reported    No    Tobacco Cessation No Change    Warm-up and Cool-down Performed on first and last piece of equipment    Resistance Training Performed Yes    VAD Patient? No    PAD/SET Patient? No      Pain Assessment   Currently in Pain? No/denies                Social History   Tobacco Use  Smoking Status Former   Packs/day: 0.00   Years: 0.00   Total pack years: 0.00   Types: Pipe, Cigarettes   Quit date: 12/26/2000   Years since quitting: 20.5  Smokeless Tobacco Current   Types: Chew  Tobacco Comments   quit over 30 years ago    Goals Met:  Independence with exercise equipment Exercise tolerated well No report of concerns or symptoms today Strength training completed today  Goals Unmet:  Not Applicable  Comments: Pt able to follow exercise prescription today without complaint.  Will continue to monitor for progression.    Dr. Emily Filbert is Medical Director for [place].  Dr. Ottie Glazier is Medical Director for [facility] Pulmonary Rehabilitation.

## 2021-07-12 DIAGNOSIS — I214 Non-ST elevation (NSTEMI) myocardial infarction: Secondary | ICD-10-CM

## 2021-07-12 DIAGNOSIS — Z955 Presence of coronary angioplasty implant and graft: Secondary | ICD-10-CM

## 2021-07-12 NOTE — Progress Notes (Signed)
Daily Session Note  Patient Details  Name: LELDON STEEGE MRN: [id number] Date of Birth: September 05, 1928 Referring Provider:   Flowsheet Row Cardiac Rehab from 04/03/2021 in [facility] Cardiac and Pulmonary Rehab  Referring Provider Serafina Royals MD       Encounter Date: 07/12/2021  Check In:  Session Check In - 07/12/21 0928       Check-In   Supervising physician immediately available to respond to emergencies See telemetry face sheet for immediately available ER MD    Location [facility]-Cardiac & Pulmonary Rehab    Staff Present Carson Myrtle, BS, RRT, CPFT;Pricila [place], MPA, RN;Joseph Tessie Fass, [place]    Virtual Visit No    Medication changes reported     No    Fall or balance concerns reported    No    Tobacco Cessation No Change    Warm-up and Cool-down Performed on first and last piece of equipment    Resistance Training Performed Yes    VAD Patient? No    PAD/SET Patient? No      Pain Assessment   Currently in Pain? No/denies                Social History   Tobacco Use  Smoking Status Former   Packs/day: 0.00   Years: 0.00   Total pack years: 0.00   Types: Pipe, Cigarettes   Quit date: 12/26/2000   Years since quitting: 20.5  Smokeless Tobacco Current   Types: Chew  Tobacco Comments   quit over 30 years ago    Goals Met:  Independence with exercise equipment Exercise tolerated well No report of concerns or symptoms today Strength training completed today  Goals Unmet:  Not Applicable  Comments: Pt able to follow exercise prescription today without complaint.  Will continue to monitor for progression.    Dr. Emily Filbert is Medical Director for [place].  Dr. Ottie Glazier is Medical Director for [facility] Pulmonary Rehabilitation.

## 2021-07-17 DIAGNOSIS — I214 Non-ST elevation (NSTEMI) myocardial infarction: Secondary | ICD-10-CM

## 2021-07-17 DIAGNOSIS — Z955 Presence of coronary angioplasty implant and graft: Secondary | ICD-10-CM

## 2021-07-19 DIAGNOSIS — Z955 Presence of coronary angioplasty implant and graft: Secondary | ICD-10-CM

## 2021-07-19 DIAGNOSIS — I214 Non-ST elevation (NSTEMI) myocardial infarction: Secondary | ICD-10-CM

## 2021-07-19 NOTE — Progress Notes (Signed)
Daily Session Note  Patient Details  Name: Shawn Andrews MRN: [id number] Date of Birth: 1928/04/02 Referring Provider:   Flowsheet Row Cardiac Rehab from 04/03/2021 in [facility] Cardiac and Pulmonary Rehab  Referring Provider Serafina Royals MD       Encounter Date: 07/19/2021  Check In:  Session Check In - 07/19/21 0904       Check-In   Supervising physician immediately available to respond to emergencies See telemetry face sheet for immediately available ER MD    Location [facility]-Cardiac & Pulmonary Rehab    Staff Present Justin Mend, RCP,RRT,BSRT;Sol Englert, MPA, RN;Melissa [place], RDN, LDN;Jessica [place], MA, RCEP, CCRP, CCET    Virtual Visit No    Medication changes reported     No    Fall or balance concerns reported    No    Tobacco Cessation No Change    Warm-up and Cool-down Performed on first and last piece of equipment    Resistance Training Performed Yes    VAD Patient? No    PAD/SET Patient? No      Pain Assessment   Currently in Pain? No/denies                Social History   Tobacco Use  Smoking Status Former   Packs/day: 0.00   Years: 0.00   Total pack years: 0.00   Types: Pipe, Cigarettes   Quit date: 12/26/2000   Years since quitting: 20.5  Smokeless Tobacco Current   Types: Chew  Tobacco Comments   quit over 30 years ago    Goals Met:  Independence with exercise equipment Exercise tolerated well No report of concerns or symptoms today Strength training completed today  Goals Unmet:  Not Applicable  Comments: Pt able to follow exercise prescription today without complaint.  Will continue to monitor for progression.    Dr. Emily Filbert is Medical Director for [place].  Dr. Ottie Glazier is Medical Director for [facility] Pulmonary Rehabilitation.

## 2021-07-26 ENCOUNTER — Encounter: Payer: Medicare Other | Attending: Internal Medicine

## 2021-07-26 DIAGNOSIS — Z48812 Encounter for surgical aftercare following surgery on the circulatory system: Secondary | ICD-10-CM | POA: Insufficient documentation

## 2021-07-26 DIAGNOSIS — I252 Old myocardial infarction: Secondary | ICD-10-CM | POA: Diagnosis present

## 2021-07-26 DIAGNOSIS — I214 Non-ST elevation (NSTEMI) myocardial infarction: Secondary | ICD-10-CM

## 2021-07-26 DIAGNOSIS — Z955 Presence of coronary angioplasty implant and graft: Secondary | ICD-10-CM | POA: Insufficient documentation

## 2021-07-26 NOTE — Progress Notes (Signed)
Daily Session Note  Patient Details  Name: Shawn Andrews MRN: [id number] Date of Birth: 12-31-1928 Referring Provider:   Flowsheet Row Cardiac Rehab from 04/03/2021 in [facility]  Referring Provider Serafina Royals MD       Encounter Date: 07/26/2021  Check In:  Session Check In - 07/26/21 0921       Check-In   Supervising physician immediately available to respond to emergencies See telemetry face sheet for immediately available ER MD    Location [facility]-Cardiac & Pulmonary Rehab    Staff Present Antionette Fairy, BS, Exercise Physiologist;Joseph Zenda, RCP,RRT,BSRT;Melissa [place], RDN, Luther Redo, MPA, RN    Virtual Visit No    Medication changes reported     No    Fall or balance concerns reported    No    Tobacco Cessation No Change    Warm-up and Cool-down Performed on first and last piece of equipment    Resistance Training Performed Yes    VAD Patient? No    PAD/SET Patient? No      Pain Assessment   Currently in Pain? No/denies    Multiple Pain Sites No                Social History   Tobacco Use  Smoking Status Former   Packs/day: 0.00   Years: 0.00   Total pack years: 0.00   Types: Pipe, Cigarettes   Quit date: 12/26/2000   Years since quitting: 20.5  Smokeless Tobacco Current   Types: Chew  Tobacco Comments   quit over 30 years ago    Goals Met:  Independence with exercise equipment Exercise tolerated well No report of concerns or symptoms today Strength training completed today  Goals Unmet:  Not Applicable  Comments: Pt able to follow exercise prescription today without complaint.  Will continue to monitor for progression.    Dr. Emily Filbert is Medical Director for [place].  Dr. Ottie Glazier is Medical Director for [facility].

## 2021-07-31 DIAGNOSIS — I214 Non-ST elevation (NSTEMI) myocardial infarction: Secondary | ICD-10-CM

## 2021-07-31 DIAGNOSIS — Z955 Presence of coronary angioplasty implant and graft: Secondary | ICD-10-CM

## 2021-07-31 DIAGNOSIS — Z48812 Encounter for surgical aftercare following surgery on the circulatory system: Secondary | ICD-10-CM | POA: Diagnosis not present

## 2021-07-31 NOTE — Progress Notes (Signed)
Daily Session Note  Patient Details  Name: Shawn Andrews MRN: [id number] Date of Birth: [birth year] Referring Provider:   Flowsheet Row Cardiac Rehab from 04/03/2021 in [facility] Card[facility] Re[facility]  Referring Provider Serafina Royals MD       Encounter Date: 07/31/2021  Check In:  Session Check In - 07/31/21 0919       Check-In   Supervising physician immediately available to respond to emergencies See telemetry face sheet for immediately available ER MD    Location [facility]    Staff Present Earlean Shawl, BS, ACSM CEP, Exercise Physiologist;Jessica Luan Pulling, MA, RCEP, CCRP, Kathaleen Maser, MPA, RN    Virtual Visit No    Medication changes reported     No    Fall or balance concerns reported    No    Tobacco Cessation No Change    Warm-up and Cool-down Performed on first and last piece of equipment    Resistance Training Performed Yes    VAD Patient? No    PAD/SET Patient? No      Pain Assessment   Currently in Pain? No/denies                Social History   Tobacco Use  Smoking Status Former   Packs/day: 0.00   Years: 0.00   Total pack years: 0.00   Types: Pipe, Cigarettes   Quit date: 12/26/2000   Years since quitting: 20.6  Smokeless Tobacco Current   Types: Chew  Tobacco Comments   quit over 30 years ago    Goals Met:  Independence with exercise equipment Exercise tolerated well No report of concerns or symptoms today Strength training completed today  Goals Unmet:  Not Applicable  Comments: Pt able to follow exercise prescription today without complaint.  Will continue to monitor for progression.    Dr. Emily Filbert is Medical Director for [place].  Dr. Ottie Glazier is Medical Director for [facility].

## 2021-08-01 ENCOUNTER — Encounter: Payer: Self-pay | Admitting: *Deleted

## 2021-08-01 DIAGNOSIS — I214 Non-ST elevation (NSTEMI) myocardial infarction: Secondary | ICD-10-CM

## 2021-08-01 DIAGNOSIS — Z955 Presence of coronary angioplasty implant and graft: Secondary | ICD-10-CM

## 2021-08-01 NOTE — Progress Notes (Signed)
Cardiac Individual Treatment Plan  Patient Details  Name: NATHANAL HERMIZ MRN: [id number] Date of Birth: 1928-12-08 Referring Provider:   Flowsheet Row Cardiac Rehab from 04/03/2021 in [facility] Cardiac and Pulmonary Rehab  Referring Provider Serafina Royals MD       Initial Encounter Date:  Flowsheet Row Cardiac Rehab from 04/03/2021 in [facility] Cardiac and Pulmonary Rehab  Date 04/03/21       Visit Diagnosis: NSTEMI (non-ST elevated myocardial infarction) [facility])  Status post coronary artery stent placement  Patient's Home Medications on Admission:  Current Outpatient Medications:    acetaminophen (TYLENOL 8 HOUR) 650 MG CR tablet, Take 1 tablet (650 mg total) by mouth 2 (two) times a day., Disp: , Rfl:    Cholecalciferol (VITAMIN D3) 25 MCG (1000 UT) CAPS, Take 1 capsule (1,000 Units total) by mouth daily., Disp: 30 capsule, Rfl:    clopidogrel (PLAVIX) 75 MG tablet, Take 1 tablet by mouth daily., Disp: , Rfl:    Coenzyme Q10 (CO Q-10) 100 MG CAPS, Take 1 capsule by mouth daily. (Patient not taking: Reported on 02/25/2021), Disp: , Rfl: 0   cyanocobalamin 1000 MCG tablet, Take 1,000 mcg by mouth daily., Disp: , Rfl:    docusate sodium (COLACE) 100 MG capsule, Take 1 capsule (100 mg total) by mouth daily., Disp: , Rfl:    doxazosin (CARDURA) 4 MG tablet, TAKE 1 TABLET BY MOUTH  DAILY, Disp: 90 tablet, Rfl: 3   famotidine (PEPCID) 20 MG tablet, TAKE 1 TABLET BY MOUTH AT  BEDTIME, Disp: 90 tablet, Rfl: 3   finasteride (PROSCAR) 5 MG tablet, TAKE 1 TABLET BY MOUTH  DAILY, Disp: 90 tablet, Rfl: 3   furosemide (LASIX) 20 MG tablet, Take 1 tablet by mouth every other day., Disp: , Rfl:    isosorbide mononitrate (IMDUR) 30 MG 24 hr tablet, Take 1 tablet by mouth daily., Disp: , Rfl:    loratadine (CLARITIN) 10 MG tablet, Take 1 tablet (10 mg total) by mouth daily as needed for allergies., Disp: 30 tablet, Rfl:    metoprolol tartrate (LOPRESSOR) 50 MG tablet, Take 0.5 tablets (25 mg total) by mouth 2  (two) times daily., Disp: , Rfl:    nitroGLYCERIN (NITROSTAT) 0.4 MG SL tablet, Place under the tongue., Disp: , Rfl:    polyethylene glycol powder (GLYCOLAX/MIRALAX) 17 GM/SCOOP powder, Take 8.5-17 g by mouth daily as needed for moderate constipation., Disp: 3350 g, Rfl: 0   primidone (MYSOLINE) 50 MG tablet, Take 2 tablets (100 mg total) by mouth 3 (three) times daily., Disp: , Rfl:    rosuvastatin (CRESTOR) 5 MG tablet, Take 10 mg by mouth daily., Disp: , Rfl:    vitamin B-12 (CYANOCOBALAMIN) 500 MCG tablet, Take 1 tablet (500 mcg total) by mouth once a week., Disp: , Rfl:   Past Medical History: Past Medical History:  Diagnosis Date   Abnormal drug screen 06/22/2014   inapprop neg xanax rpt 3 mo (06/2014)   BPH (benign prostatic hypertrophy) 01/21/1998   has had 3 biopsies in past ([facility]) decided to stop PSA/DRE   CHF (congestive heart failure) (HCC)    Coronary artery disease    Hyperlipidemia 01/21/2002   Hypertension 05/22/2003   Left lumbar radiculopathy    Osteoarthritis 01/22/1988   knees, lumbar spondylosis and listhesis    Tobacco Use: Social History   Tobacco Use  Smoking Status Former   Packs/day: 0.00   Years: 0.00   Total pack years: 0.00   Types: Pipe, Cigarettes   Quit date:  12/26/2000   Years since quitting: 20.6  Smokeless Tobacco Current   Types: Chew  Tobacco Comments   quit over 30 years ago    Labs: Review Flowsheet  More data exists      Latest Ref Rng & Units 08/18/2018 08/25/2019 08/25/2020 01/17/2021 01/18/2021  Labs for ITP Cardiac and Pulmonary Rehab  Cholestrol 0 - 200 mg/dL 190  202  212  - 157   LDL (calc) 0 - 99 mg/dL 129  134  138  - 99   HDL-C >40 mg/dL 43.50  45.80  49.80  - 45   Trlycerides <150 mg/dL 88.0  110.0  119.0  - 66   Hemoglobin A1c 4.8 - 5.6 % - - - 5.0  -     Exercise Target Goals: Exercise Program Goal: Individual exercise prescription set using results from initial 6 min walk test and THRR while considering   patient's activity barriers and safety.   Exercise Prescription Goal: Initial exercise prescription builds to 30-45 minutes a day of aerobic activity, 2-3 days per week.  Home exercise guidelines will be given to patient during program as part of exercise prescription that the participant will acknowledge.   Education: Aerobic Exercise: - Group verbal and visual presentation on the components of exercise prescription. Introduces F.I.T.T principle from ACSM for exercise prescriptions.  Reviews F.I.T.T. principles of aerobic exercise including progression. Written material given at graduation. Flowsheet Row Cardiac Rehab from 04/03/2021 in [facility] Cardiac and Pulmonary Rehab  Education need identified 04/03/21       Education: Resistance Exercise: - Group verbal and visual presentation on the components of exercise prescription. Introduces F.I.T.T principle from ACSM for exercise prescriptions  Reviews F.I.T.T. principles of resistance exercise including progression. Written material given at graduation.    Education: Exercise & Equipment Safety: - Individual verbal instruction and demonstration of equipment use and safety with use of the equipment. Flowsheet Row Cardiac Rehab from 04/03/2021 in [facility] Cardiac and Pulmonary Rehab  Education need identified 04/03/21  Date 04/03/21  Educator Marshall  Instruction Review Code 1- Verbalizes Understanding       Education: Exercise Physiology & General Exercise Guidelines: - Group verbal and written instruction with models to review the exercise physiology of the cardiovascular system and associated critical values. Provides general exercise guidelines with specific guidelines to those with heart or lung disease.  Flowsheet Row Cardiac Rehab from 04/03/2021 in [facility] Cardiac and Pulmonary Rehab  Education need identified 04/03/21       Education: Flexibility, Balance, Mind/Body Relaxation: - Group verbal and visual presentation with interactive  activity on the components of exercise prescription. Introduces F.I.T.T principle from ACSM for exercise prescriptions. Reviews F.I.T.T. principles of flexibility and balance exercise training including progression. Also discusses the mind body connection.  Reviews various relaxation techniques to help reduce and manage stress (i.e. Deep breathing, progressive muscle relaxation, and visualization). Balance handout provided to take home. Written material given at graduation.   Activity Barriers & Risk Stratification:  Activity Barriers & Cardiac Risk Stratification - 04/03/21 0943       Activity Barriers & Cardiac Risk Stratification   Activity Barriers Deconditioning;Decreased Ventricular Function    Comments Suprapubic catheter- reports no limitation or problems with movement or activity    Cardiac Risk Stratification High             6 Minute Walk:  6 Minute Walk     Row Name 04/03/21 0944         6 Minute Walk  Phase Initial     Distance 1040 feet     Walk Time 6 minutes     # of Rest Breaks 0     MPH 1.96     METS 1.41     RPE 9     Perceived Dyspnea  0     VO2 Peak 4.93     Symptoms No     Resting HR 65 bpm     Resting BP 102/56     Resting Oxygen Saturation  98 %     Exercise Oxygen Saturation  during 6 min walk 94 %     Max Ex. HR 84 bpm     Max Ex. BP 132/62     2 Minute Post BP 100/58              Oxygen Initial Assessment:   Oxygen Re-Evaluation:   Oxygen Discharge (Final Oxygen Re-Evaluation):   Initial Exercise Prescription:  Initial Exercise Prescription - 04/03/21 1400       Date of Initial Exercise RX and Referring Provider   Date 04/03/21    Referring Provider Serafina Royals MD      Oxygen   Maintain Oxygen Saturation 88% or higher      Treadmill   MPH 1.8    Grade 0.5    Minutes 15    METs 2.5      NuStep   Level 1    SPM 80    Minutes 15    METs 1.4      REL-XR   Level 1    Speed 50    Minutes 15    METs 1.4       Prescription Details   Frequency (times per week) 2-3   TBD   Duration Progress to 30 minutes of continuous aerobic without signs/symptoms of physical distress      Intensity   THRR 40-80% of Max Heartrate 91-117    Ratings of Perceived Exertion 11-13    Perceived Dyspnea 0-4      Progression   Progression Continue to progress workloads to maintain intensity without signs/symptoms of physical distress.      Resistance Training   Training Prescription Yes    Weight 3 lb    Reps 10-15             Perform Capillary Blood Glucose checks as needed.  Exercise Prescription Changes:   Exercise Prescription Changes     Row Name 04/03/21 1400 06/04/21 0800 06/05/21 0900 06/19/21 1600 07/02/21 1500     Response to Exercise   Blood Pressure (Admit) 102/56 112/58 -- 108/62 122/58   Blood Pressure (Exercise) 132/62 122/76 -- 124/60 --   Blood Pressure (Exit) 100/58 124/60 -- 126/64 112/60   Heart Rate (Admit) 65 bpm 75 bpm -- 63 bpm 71 bpm   Heart Rate (Exercise) 84 bpm 77 bpm -- 97 bpm 87 bpm   Heart Rate (Exit) 66 bpm 65 bpm -- 68 bpm 69 bpm   Oxygen Saturation (Admit) 98 % -- -- -- --   Oxygen Saturation (Exercise) 94 % -- -- -- --   Oxygen Saturation (Exit) 96 % -- -- -- --   Rating of Perceived Exertion (Exercise) 9 13 -- 13 12   Perceived Dyspnea (Exercise) 0 -- -- -- --   Symptoms none none -- none none   Comments walk test results second full day of exercise -- -- --   Duration -- Progress to 30 minutes of  aerobic  without signs/symptoms of physical distress -- Continue with 30 min of aerobic exercise without signs/symptoms of physical distress. Continue with 30 min of aerobic exercise without signs/symptoms of physical distress.   Intensity -- THRR unchanged -- THRR unchanged THRR unchanged     Progression   Progression -- Continue to progress workloads to maintain intensity without signs/symptoms of physical distress. -- Continue to progress workloads to maintain  intensity without signs/symptoms of physical distress. Continue to progress workloads to maintain intensity without signs/symptoms of physical distress.   Average METs -- 2.29 -- 2.86 3.26     Resistance Training   Training Prescription -- Yes -- Yes Yes   Weight -- 3 lb -- 3 lb 6 lb   Reps -- 10-15 -- 10-15 10-15     Interval Training   Interval Training -- No -- No No     Treadmill   MPH -- 1.9 -- 2.1 2.1   Grade -- 0.5 -- 0.5 0.5   Minutes -- 15 -- 15 15   METs -- 259 -- 2.75 2.75     Recumbant Bike   Level -- -- -- 1 3   Watts -- -- -- -- 27   Minutes -- -- -- 15 15   METs -- -- -- 1.9 2.98     Arm Ergometer   Level -- 1 -- -- --   Minutes -- 15 -- -- --     REL-XR   Level -- 3 -- -- 1   Minutes -- 15 -- -- 15   METs -- -- -- -- 5.2     T5 Nustep   Level -- -- -- 3 3   Minutes -- -- -- 15 15   METs -- -- -- 2.4 2.4     Biostep-RELP   Level -- -- -- 2 2   Minutes -- -- -- 15 15   METs -- -- -- 3 3     Track   Laps -- -- -- 18 --   Minutes -- 15 -- 15 --   METs -- -- -- 1.98 --     Home Exercise Plan   Plans to continue exercise at -- -- [employer] (comment)  [employer] (treadmill, bike, [employer] machines) Forensic scientist (comment)  [employer] (treadmill, bike, [employer]) Forensic scientist (comment)  [employer] (treadmill, bike, [employer])   Frequency -- -- Add 3 additional days to program exercise sessions. Add 3 additional days to program exercise sessions. Add 3 additional days to program exercise sessions.   Initial Home Exercises Provided -- -- 06/05/21 06/05/21 06/05/21     Oxygen   Maintain Oxygen Saturation -- 88% or higher 88% or higher 88% or higher 88% or higher    Row Name 07/16/21 0700 07/30/21 0800           Response to Exercise   Blood Pressure (Admit) 102/60 112/60      Blood Pressure (Exit) 100/56 124/62      Heart Rate (Admit) 59 bpm 58 bpm      Heart Rate (Exercise) 95 bpm 84 bpm      Heart Rate (Exit) 62 bpm 63 bpm       Rating of Perceived Exertion (Exercise) 13 12      Symptoms none none      Duration Continue with 30 min of aerobic exercise without signs/symptoms of physical distress. Continue with 30 min of aerobic exercise without signs/symptoms of physical distress.      Intensity THRR unchanged  THRR New  85-115        Progression   Progression Continue to progress workloads to maintain intensity without signs/symptoms of physical distress. Continue to progress workloads to maintain intensity without signs/symptoms of physical distress.      Average METs 2.62 2.67        Resistance Training   Training Prescription Yes Yes      Weight 6 lb 6 lb      Reps 10-15 10-15        Interval Training   Interval Training No No        Recumbant Bike   Level 4 4      Watts 26 25      Minutes 15 15      METs 2.96 2.96        NuStep   Level 1 2      Minutes 15 15      METs 2.6 --        T5 Nustep   Level 4 5      Minutes 15 15      METs 2.1 2.1        Biostep-RELP   Level 3 3      Minutes 15 15      METs 3 3        Track   Laps 30 30      Minutes 15 15      METs 2.63 2.63        Home Exercise Plan   Plans to continue exercise at [employer] (comment)  [employer] (treadmill, bike, weight machines) 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 Add 3 additional days to program exercise sessions.      Initial Home Exercises Provided 06/05/21 06/05/21        Oxygen   Maintain Oxygen Saturation 88% or higher 88% or higher               Exercise Comments:   Exercise Comments     Row Name 05/29/21 0934           Exercise Comments First full day of exercise!  Patient was oriented to gym and equipment including functions, settings, policies, and procedures.  Patient's individual exercise prescription and treatment plan were reviewed.  All starting workloads were established based on the results of  the 6 minute walk test done at initial orientation visit.  The plan for exercise progression was also introduced and progression will be customized based on patient's performance and goals.                Exercise Goals and Review:   Exercise Goals     Row Name 04/03/21 0945             Exercise Goals   Increase Physical Activity Yes       Intervention Provide advice, education, support and counseling about physical activity/exercise needs.;Develop an individualized exercise prescription for aerobic and resistive training based on initial evaluation findings, risk stratification, comorbidities and participant's personal goals.       Expected Outcomes Short Term: Attend rehab on a regular basis to increase amount of physical activity.;Long Term: Add in home exercise to make exercise part of routine and to increase amount of physical activity.;Long Term: Exercising regularly at least 3-5 days a week.       Increase Strength and Stamina Yes  Intervention Provide advice, education, support and counseling about physical activity/exercise needs.;Develop an individualized exercise prescription for aerobic and resistive training based on initial evaluation findings, risk stratification, comorbidities and participant's personal goals.       Expected Outcomes Short Term: Increase workloads from initial exercise prescription for resistance, speed, and METs.;Short Term: Perform resistance training exercises routinely during rehab and add in resistance training at home;Long Term: Improve cardiorespiratory fitness, muscular endurance and strength as measured by increased METs and functional capacity (6MWT)       Able to understand and use rate of perceived exertion (RPE) scale Yes       Intervention Provide education and explanation on how to use RPE scale       Expected Outcomes Short Term: Able to use RPE daily in rehab to express subjective intensity level;Long Term:  Able to use RPE to guide  intensity level when exercising independently       Able to understand and use Dyspnea scale Yes       Intervention Provide education and explanation on how to use Dyspnea scale       Expected Outcomes Short Term: Able to use Dyspnea scale daily in rehab to express subjective sense of shortness of breath during exertion;Long Term: Able to use Dyspnea scale to guide intensity level when exercising independently       Knowledge and understanding of Target Heart Rate Range (THRR) Yes       Intervention Provide education and explanation of THRR including how the numbers were predicted and where they are located for reference       Expected Outcomes Short Term: Able to use daily as guideline for intensity in rehab;Short Term: Able to state/look up THRR;Long Term: Able to use THRR to govern intensity when exercising independently       Able to check pulse independently Yes       Intervention Provide education and demonstration on how to check pulse in carotid and radial arteries.;Review the importance of being able to check your own pulse for safety during independent exercise       Expected Outcomes Short Term: Able to explain why pulse checking is important during independent exercise;Long Term: Able to check pulse independently and accurately       Understanding of Exercise Prescription Yes       Intervention Provide education, explanation, and written materials on patient's individual exercise prescription       Expected Outcomes Short Term: Able to explain program exercise prescription;Long Term: Able to explain home exercise prescription to exercise independently                Exercise Goals Re-Evaluation :  Exercise Goals Re-Evaluation     Row Name 05/29/21 0934 06/04/21 0849 06/05/21 0949 06/19/21 0947 06/19/21 1631     Exercise Goal Re-Evaluation   Exercise Goals Review Increase Physical Activity;Able to understand and use rate of perceived exertion (RPE) scale;Understanding of Exercise  Prescription;Knowledge and understanding of Target Heart Rate Range (THRR);Increase Strength and Stamina;Able to understand and use Dyspnea scale;Able to check pulse independently Increase Physical Activity;Increase Strength and Stamina;Understanding of Exercise Prescription Increase Physical Activity;Increase Strength and Stamina;Understanding of Exercise Prescription Increase Physical Activity;Increase Strength and Stamina;Understanding of Exercise Prescription Increase Physical Activity;Increase Strength and Stamina;Understanding of Exercise Prescription   Comments Reviewed RPE and dyspnea scales, THR and program prescription with pt today.  Pt voiced understanding and was given a copy of goals to take home. Alexei is off to a good start  in rehab.  He has completed his first two full days of rehab.  We will continue to montior his progress. Reviewed home exercise with pt today.  Pt plans to workout at the [facility] for exercise. He is already exercising on the bike, treadmill, and doing weight machines. Reviewed THR, pulse, RPE, sign and symptoms, pulse oximetery and when to call 911 or MD.  Also discussed weather considerations and indoor options.  Pt voiced understanding. Shigeru is doing well in rehab.  He feels like his strength is starting to come back. He has been going to the [facility] for exercise on his off days.  He is also staying active at home with chores his wifes has for him. He is able to do them now without running out of stamina. Keaston is continuing to do well in rehab. He has increased his TM speed to 2.1 mph and is already at level 3 on the T5 Nustep. All RPEs are in appropriate range. He would benefit from increasing his level on the recumbant bike. Will continue to monitor.   Expected Outcomes Short: Use RPE daily to regulate intensity. Long: Follow program prescription in THR. Short: Count laps on track and attend regularly  Long: Continue to follow program prescription Short: continue to monitor HR  using THR Long: Continue to exercise at home independently Short; Continue to go to [facility] on off days Long: Continue to improve stamina Short: Increase level on RB Long: Continue to increase overall MET level    Row Name 07/02/21 1505 07/16/21 0735 07/17/21 0928 07/30/21 0833       Exercise Goal Re-Evaluation   Exercise Goals Review Increase Physical Activity;Increase Strength and Stamina;Understanding of Exercise Prescription Increase Physical Activity;Increase Strength and Stamina;Understanding of Exercise Prescription Increase Physical Activity;Increase Strength and Stamina;Understanding of Exercise Prescription Increase Physical Activity;Increase Strength and Stamina;Understanding of Exercise Prescription    Comments Derik is doing well in rehab.  He is up to 27 watts on the bike and using 6 lb hand weights.  We will continue to monitor his progress. Elvis is doing well in rehab. He recently improved to level 4 on the T5 machine and level 3 on the Biostep. He increased to 30 laps on the track also. He has tolerated using 6 lb hand weights for resistance training as well. We will continue to monitor his progress in the program. Yida is doing well in rehab.  He is going to the [facility] on his off days for 30 min on the bike and treadmill.  He is starting to feel stronger and has some more stamina. He is about half way through the program already. Kristoff continues to do well in rehab.  He is up to using 6 lb handweights.  He likes to break up his time on the track and will do two rounds of 15 laps  (15 min) with the NuStep in the middle so as not to aggravate his hip as much.  We will continue to monitor his progress.    Expected Outcomes Short: Back up to level 4 on T5 Long; Conitnue to improve stamina Short: increase level on T4 Long: Continue to improve MET levels Short:Conitnue to go to [facility] on off days Long; Conintue to improve stamina Short: Continue to move up seated equipment Long: Continue to improve  stamina on the track             Discharge Exercise Prescription (Final Exercise Prescription Changes):  Exercise Prescription Changes - 07/30/21 0800  Response to Exercise   Blood Pressure (Admit) 112/60    Blood Pressure (Exit) 124/62    Heart Rate (Admit) 58 bpm    Heart Rate (Exercise) 84 bpm    Heart Rate (Exit) 63 bpm    Rating of Perceived Exertion (Exercise) 12    Symptoms none    Duration Continue with 30 min of aerobic exercise without signs/symptoms of physical distress.    Intensity THRR New   85-115     Progression   Progression Continue to progress workloads to maintain intensity without signs/symptoms of physical distress.    Average METs 2.67      Resistance Training   Training Prescription Yes    Weight 6 lb    Reps 10-15      Interval Training   Interval Training No      Recumbant Bike   Level 4    Watts 25    Minutes 15    METs 2.96      NuStep   Level 2    Minutes 15      T5 Nustep   Level 5    Minutes 15    METs 2.1      Biostep-RELP   Level 3    Minutes 15    METs 3      Track   Laps 30    Minutes 15    METs 2.63      Home Exercise Plan   Plans to continue exercise at [employer] (comment)   [employer] (treadmill, bike, weight machines)   Frequency Add 3 additional days to program exercise sessions.    Initial Home Exercises Provided 06/05/21      Oxygen   Maintain Oxygen Saturation 88% or higher             Nutrition:  Target Goals: Understanding of nutrition guidelines, daily intake of sodium '1500mg'$ , cholesterol '200mg'$ , calories 30% from fat and 7% or less from saturated fats, daily to have 5 or more servings of fruits and vegetables.  Education: All About Nutrition: -Group instruction provided by verbal, written material, interactive activities, discussions, models, and posters to present general guidelines for heart healthy nutrition including fat, fiber, MyPlate, the role of sodium in heart healthy  nutrition, utilization of the nutrition label, and utilization of this knowledge for meal planning. Follow up email sent as well. Written material given at graduation. Flowsheet Row Cardiac Rehab from 04/03/2021 in [facility] Cardiac and Pulmonary Rehab  Education need identified 04/03/21       Biometrics:  Pre Biometrics - 04/03/21 0943       Pre Biometrics   Height 5' 10.5" (1.791 m)    Weight 178 lb (80.7 kg)    BMI (Calculated) 25.17    Single Leg Stand 1.94 seconds              Nutrition Therapy Plan and Nutrition Goals:  Nutrition Therapy & Goals - 05/29/21 0955       Nutrition Therapy   RD appointment deferred Yes   Pt would not like to meet with RD as he feels his wife has him on a good diet. Will continue to follow up.     Personal Nutrition Goals   Nutrition Goal Pt would not like to meet with RD as he feels his wife has him on a good diet. Will continue to follow up.             Nutrition Assessments:  MEDIFICTS Score Key: ?27  Need to make dietary changes  40-70 Heart Healthy Diet ? 40 Therapeutic Level Cholesterol Diet  Flowsheet Row Cardiac Rehab from 04/03/2021 in [facility] Cardiac and Pulmonary Rehab  Picture Your Plate Total Score on Admission 86      Picture Your Plate Scores: <19 Unhealthy dietary pattern with much room for improvement. 41-50 Dietary pattern unlikely to meet recommendations for good health and room for improvement. 51-60 More healthful dietary pattern, with some room for improvement.  >60 Healthy dietary pattern, although there may be some specific behaviors that could be improved.    Nutrition Goals Re-Evaluation:  Nutrition Goals Re-Evaluation     [place] Name 06/19/21 [phone] 07/17/21 0931           Goals   Nutrition Goal Pt would not like to meet with RD as he feels his wife has him on a good diet. Will continue to follow up. Pt would not like to meet with RD as he feels his wife has him on a good diet. Will continue to follow up.       Comment Hameed is doing well in rehab.  He feels that he and his wife are following a good diet.  He says that she keeps him on a strict healthy diet to keep him good.  They stay away from salt and she has broken his habit of using salt.  He has backed off sugary sweets.  At one point, he was over 200 lb, but they have worked on it.  They eat a lot of chicken and vegetables.  They are enjoying the fruits coming into season now. Uel is doing well in rehab. He and his wife do well at watching his diet.  She keeps him eating healthy.      Expected Outcome Short: Continue to get a good variety Long; Continue to focus on heart healthy eating. Short: Continue to get a good variety Long; Continue to focus on heart healthy eating.               Nutrition Goals Discharge (Final Nutrition Goals Re-Evaluation):  Nutrition Goals Re-Evaluation - 07/17/21 0931       Goals   Nutrition Goal Pt would not like to meet with RD as he feels his wife has him on a good diet. Will continue to follow up.    Comment Benecio is doing well in rehab. He and his wife do well at watching his diet.  She keeps him eating healthy.    Expected Outcome Short: Continue to get a good variety Long; Continue to focus on heart healthy eating.             Psychosocial: Target Goals: Acknowledge presence or absence of significant depression and/or stress, maximize coping skills, provide positive support system. Participant is able to verbalize types and ability to use techniques and skills needed for reducing stress and depression.   Education: Stress, Anxiety, and Depression - Group verbal and visual presentation to define topics covered.  Reviews how body is impacted by stress, anxiety, and depression.  Also discusses healthy ways to reduce stress and to treat/manage anxiety and depression.  Written material given at graduation.   Education: Sleep Hygiene -Provides group verbal and written instruction about how sleep can  affect your health.  Define sleep hygiene, discuss sleep cycles and impact of sleep habits. Review good sleep hygiene tips.    Initial Review & Psychosocial Screening:  Initial Psych Review & Screening - 03/21/21 1412  Initial Review   Current issues with None Identified      Family Dynamics   Good Support System? Yes   wife     Barriers   Psychosocial barriers to participate in program There are no identifiable barriers or psychosocial needs.      Screening Interventions   Interventions Encouraged to exercise;To provide support and resources with identified psychosocial needs    Expected Outcomes Long Term Goal: Stressors or current issues are controlled or eliminated.;Short Term goal: Utilizing psychosocial counselor, staff and physician to assist with identification of specific Stressors or current issues interfering with healing process. Setting desired goal for each stressor or current issue identified.;Short Term goal: Identification and review with participant of any Quality of Life or Depression concerns found by scoring the questionnaire.;Long Term goal: The participant improves quality of Life and PHQ9 Scores as seen by post scores and/or verbalization of changes             Quality of Life Scores:   Quality of Life - 04/03/21 0933       Quality of Life   Select Quality of Life      Quality of Life Scores   Health/Function Pre 26.57 %    Socioeconomic Pre 25.63 %    Psych/Spiritual Pre 29.64 %    Family Pre 28.8 %    GLOBAL Pre 27.29 %            Scores of 19 and below usually indicate a poorer quality of life in these areas.  A difference of  2-3 points is a clinically meaningful difference.  A difference of 2-3 points in the total score of the Quality of Life Index has been associated with significant improvement in overall quality of life, self-image, physical symptoms, and general health in studies assessing change in quality of life.  PHQ-9: Review  Flowsheet  More data exists      04/03/2021 03/16/2021 02/01/2021 08/30/2020 08/25/2019  Depression screen PHQ 2/9  Decreased Interest 0 0 0 0 0  Down, Depressed, Hopeless 0 0 0 0 0  PHQ - 2 Score 0 0 0 0 0  Altered sleeping 1 - - - -  Tired, decreased energy 1 - - - -  Change in appetite 0 - - - -  Feeling bad or failure about yourself  0 - - - -  Trouble concentrating 0 - - - -  Moving slowly or fidgety/restless 0 - - - -  Suicidal thoughts 0 - - - -  PHQ-9 Score 2 - - - -  Difficult doing work/chores Not difficult at all - - - -   Interpretation of Total Score  Total Score Depression Severity:  1-4 = Minimal depression, 5-9 = Mild depression, 10-14 = Moderate depression, 15-19 = Moderately severe depression, 20-27 = Severe depression   Psychosocial Evaluation and Intervention:  Psychosocial Evaluation - 03/21/21 1412       Psychosocial Evaluation & Interventions   Comments Mr. Muscatello wants to come in person to see the gym and to see if this is something he is interested in. [facility], and lift some weights. He is looking to get back into that routine but doesn't know if he needs this program specifically. He has had recent hospital admissions related to urinary retention and recently had a suprapubic catheter placed per notes. He states he has been feeling well. His wife helps manage his health  care.    Expected Outcomes Short: decide on whether to attend cardiac rehab or [facility]. Long: maintain positive self care habits.    Continue Psychosocial Services  Follow up required by staff             Psychosocial Re-Evaluation:  Psychosocial Re-Evaluation     [place] Name 06/19/21 0948 07/17/21 0930           Psychosocial Re-Evaluation   Current issues with None Identified None Identified      Comments Constantino is doing well in [place].  He feels good mentally.  His finances are in check and he does not feel like he has any major  stressors currently.  He is sleeping good.  He occasionnaly will have rough nights, but overall sleeps good.  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  He exercises routinely for mental boost. He has enjoyed being supervised again to help move up more. Klaus is doing well in rehab.  He is good mentally and denies any major stressors.  He sleeps well for most part.  He is exercising at the [facility] on his off days to keep him boosted.  He is able to go and do what he wants.  His biggest limitation is his sciatic nerve pain.  As long as he keeps moving it helps.  He is also active in the [employer].      Expected Outcomes Short: Continue to exercise for mental boost Long: continue to stay positive Short; Continue to exercise for mental boost Long; Conitnue to keep moving.      Interventions Encouraged to attend Cardiac Rehabilitation for the exercise Encouraged to attend Cardiac Rehabilitation for the exercise      Continue Psychosocial Services  Follow up required by staff --               Psychosocial Discharge (Final Psychosocial Re-Evaluation):  Psychosocial Re-Evaluation - 07/17/21 0930       Psychosocial Re-Evaluation   Current issues with None Identified    Comments Jashad is doing well in rehab.  He is good mentally and denies any major stressors.  He sleeps well for most part.  He is exercising at the [facility] on his off days to keep him boosted.  He is able to go and do what he wants.  His biggest limitation is his sciatic nerve pain.  As long as he keeps moving it helps.  He is also active in the [employer].    Expected Outcomes Short; Continue to exercise for mental boost Long; Conitnue to keep moving.    Interventions Encouraged to attend Cardiac Rehabilitation for the exercise             Vocational Rehabilitation: Provide vocational rehab assistance to qualifying candidates.   Vocational Rehab Evaluation &  Intervention:  Vocational Rehab - 03/21/21 1412       Initial Vocational Rehab Evaluation & Intervention   Assessment shows need for Vocational Rehabilitation No             Education: Education Goals: Education classes will be provided on a variety of topics geared toward better understanding of heart health and risk factor modification. Participant will state understanding/return demonstration of topics presented as noted by education test scores.  Learning Barriers/Preferences:  Learning Barriers/Preferences - 03/21/21 1412       Learning Barriers/Preferences   Learning Preferences Individual Instruction  General Cardiac Education Topics:  AED/CPR: - Group verbal and written instruction with the use of models to demonstrate the basic use of the AED with the basic ABC's of resuscitation.   Anatomy and Cardiac Procedures: - Group verbal and visual presentation and models provide information about basic cardiac anatomy and function. Reviews the testing methods done to diagnose heart disease and the outcomes of the test results. Describes the treatment choices: Medical Management, Angioplasty, or Coronary Bypass Surgery for treating various heart conditions including Myocardial Infarction, Angina, Valve Disease, and Cardiac Arrhythmias.  Written material given at graduation. Flowsheet Row Cardiac Rehab from 04/03/2021 in [facility] Cardiac and Pulmonary Rehab  Education need identified 04/03/21       Medication Safety: - Group verbal and visual instruction to review commonly prescribed medications for heart and lung disease. Reviews the medication, class of the drug, and side effects. Includes the steps to properly store meds and maintain the prescription regimen.  Written material given at graduation.   Intimacy: - Group verbal instruction through game format to discuss how heart and lung disease can affect sexual intimacy. Written material given at  graduation..   Know Your Numbers and Heart Failure: - Group verbal and visual instruction to discuss disease risk factors for cardiac and pulmonary disease and treatment options.  Reviews associated critical values for Overweight/Obesity, Hypertension, Cholesterol, and Diabetes.  Discusses basics of heart failure: signs/symptoms and treatments.  Introduces Heart Failure Zone chart for action plan for heart failure.  Written material given at graduation.   Infection Prevention: - Provides verbal and written material to individual with discussion of infection control including proper hand washing and proper equipment cleaning during exercise session. Flowsheet Row Cardiac Rehab from 04/03/2021 in [facility] Cardiac and Pulmonary Rehab  Education need identified 04/03/21  Date 04/03/21  Educator [place]  Instruction Review Code 1- Verbalizes Understanding       Falls Prevention: - Provides verbal and written material to individual with discussion of falls prevention and safety. Flowsheet Row Cardiac Rehab from 04/03/2021 in [facility] Cardiac and Pulmonary Rehab  Education need identified 04/03/21  Date 04/03/21  Educator [place]  Instruction Review Code 1- Verbalizes Understanding       Other: -Provides group and verbal instruction on various topics (see comments)   Knowledge Questionnaire Score:  Knowledge Questionnaire Score - 04/03/21 0935       Knowledge Questionnaire Score   Pre Score 22/26: Exercise, Nutrition, Angina, Nitro             Core Components/Risk Factors/Patient Goals at Admission:  Personal Goals and Risk Factors at Admission - 04/03/21 0945       Core Components/Risk Factors/Patient Goals on Admission    Weight Management Yes;Weight Maintenance    Intervention Weight Management: Develop a combined nutrition and exercise program designed to reach desired caloric intake, while maintaining appropriate intake of nutrient and fiber, sodium and fats, and appropriate energy  expenditure required for the weight goal.;Weight Management: Provide education and appropriate resources to help participant work on and attain dietary goals.;Weight Management/Obesity: Establish reasonable short term and long term weight goals.    Admit Weight 178 lb (80.7 kg)    Goal Weight: Short Term 178 lb (80.7 kg)    Goal Weight: Long Term 178 lb (80.7 kg)    Expected Outcomes Short Term: Continue to assess and modify interventions until short term weight is achieved;Long Term: Adherence to nutrition and physical activity/exercise program aimed toward attainment of established weight goal;Weight Maintenance: Understanding of  the daily nutrition guidelines, which includes 25-35% calories from fat, 7% or less cal from saturated fats, less than $RemoveB'200mg'dJAZBWDD$  cholesterol, less than 1.5gm of sodium, & 5 or more servings of fruits and vegetables daily;Understanding recommendations for meals to include 15-35% energy as protein, 25-35% energy from fat, 35-60% energy from carbohydrates, less than $RemoveB'200mg'kZKlRZJv$  of dietary cholesterol, 20-35 gm of total fiber daily;Understanding of distribution of calorie intake throughout the day with the consumption of 4-5 meals/snacks    Heart Failure Yes    Intervention Provide a combined exercise and nutrition program that is supplemented with education, support and counseling about heart failure. Directed toward relieving symptoms such as shortness of breath, decreased exercise tolerance, and extremity edema.    Expected Outcomes Improve functional capacity of life;Short term: Attendance in program 2-3 days a week with increased exercise capacity. Reported lower sodium intake. Reported increased fruit and vegetable intake. Reports medication compliance.;Long term: Adoption of self-care skills and reduction of barriers for early signs and symptoms recognition and intervention leading to self-care maintenance.;Short term: Daily weights obtained and reported for increase. Utilizing diuretic  protocols set by physician.    Hypertension Yes    Intervention Provide education on lifestyle modifcations including regular physical activity/exercise, weight management, moderate sodium restriction and increased consumption of fresh fruit, vegetables, and low fat dairy, alcohol moderation, and smoking cessation.;Monitor prescription use compliance.    Expected Outcomes Short Term: Continued assessment and intervention until BP is < 140/36mm HG in hypertensive participants. < 130/4mm HG in hypertensive participants with diabetes, heart failure or chronic kidney disease.;Long Term: Maintenance of blood pressure at goal levels.    Lipids Yes    Intervention Provide education and support for participant on nutrition & aerobic/resistive exercise along with prescribed medications to achieve LDL '70mg'$ , HDL >$Remo'40mg'gPsfS$ .    Expected Outcomes Short Term: Participant states understanding of desired cholesterol values and is compliant with medications prescribed. Participant is following exercise prescription and nutrition guidelines.;Long Term: Cholesterol controlled with medications as prescribed, with individualized exercise RX and with personalized nutrition plan. Value goals: LDL < $Rem'70mg'lhLW$ , HDL > 40 mg.             Education:Diabetes - Individual verbal and written instruction to review signs/symptoms of diabetes, desired ranges of glucose level fasting, after meals and with exercise. Acknowledge that pre and post exercise glucose checks will be done for 3 sessions at entry of program.   Core Components/Risk Factors/Patient Goals Review:   Goals and Risk Factor Review     Row Name 06/19/21 0953 07/17/21 0933           Core Components/Risk Factors/Patient Goals Review   Personal Goals Review Weight Management/Obesity;Hypertension;Heart Failure Weight Management/Obesity;Hypertension;Heart Failure      Review Scott is doing well in rehab.  His weight is doing well as they continue to eat well. He has not  had any heart failure symptoms.  His blood pressures have been doing well in class.  he does not check them at home, unless something is feeling off. Flynn is doing well in rehab.  His weight is holding steady and his wife watches his diet.  He tries to stay active.  His pressures are doing well and he has been checking them at home along with his weight twice a day.  He has not noted any heart failure symptoms.      Expected Outcomes [place]rot: Continue to maintain weight Long: Continue to manage heart failure Short: COnitnue to track pressures Long: COnitnue to monitor  risk factors.               Core Components/Risk Factors/Patient Goals at Discharge (Final Review):   Goals and Risk Factor Review - 07/17/21 0933       Core Components/Risk Factors/Patient Goals Review   Personal Goals Review Weight Management/Obesity;Hypertension;Heart Failure    Review Oswin is doing well in rehab.  His weight is holding steady and his wife watches his diet.  He tries to stay active.  His pressures are doing well and he has been checking them at home along with his weight twice a day.  He has not noted any heart failure symptoms.    Expected Outcomes Short: COnitnue to track pressures Long: COnitnue to monitor risk factors.             ITP Comments:  ITP Comments     Row Name 03/21/21 1411 04/03/21 0930 04/11/21 0727 05/09/21 1220 05/21/21 0841   ITP Comments Initial telephone orientation completed. Diagnosis can be found in [facility] 9/17. EP orientation scheduled for Tuesday 3/14 at 8am. Completed 6MWT and gym orientation. Initial ITP created and sent for review to Dr. Emily Filbert, Medical Director. 30 Day review completed. Medical Director ITP review done, changes made as directed, and signed approval by Medical Director. 30 Day review completed. Medical Director ITP review done, changes made as directed, and signed approval by Medical Director. Lot has not started cardiac rehab yet as he was out for a month.  His start date is projected  on 5/9.    [place] Name 05/29/21 [phone] 06/06/21 0804 07/04/21 0711 08/01/21 0734     ITP Comments First full day of exercise!  Patient was oriented to gym and equipment including functions, settings, policies, and procedures.  Patient's individual exercise prescription and treatment plan were reviewed.  All starting workloads were established based on the results of the 6 minute walk test done at initial orientation visit.  The plan for exercise progression was also introduced and progression will be customized based on patient's performance and goals. 30 Day review completed. Medical Director ITP review done, changes made as directed, and signed approval by Medical Director.     New 30 Day review completed. Medical Director ITP review done, changes made as directed, and signed approval by Medical Director. 30 Day review completed. Medical Director ITP review done, changes made as directed, and signed approval by Medical Director.             Comments:

## 2021-08-02 DIAGNOSIS — Z48812 Encounter for surgical aftercare following surgery on the circulatory system: Secondary | ICD-10-CM | POA: Diagnosis not present

## 2021-08-02 DIAGNOSIS — Z955 Presence of coronary angioplasty implant and graft: Secondary | ICD-10-CM

## 2021-08-02 DIAGNOSIS — I214 Non-ST elevation (NSTEMI) myocardial infarction: Secondary | ICD-10-CM

## 2021-08-02 NOTE — Progress Notes (Signed)
Daily Session Note  Patient Details  Name: PURCELL JUNGBLUTH MRN: [id number] Date of Birth: 1929-01-12 Referring Provider:   Flowsheet Row Cardiac Rehab from 04/03/2021 in [facility] Cardiac and Pulmonary Rehab  Referring Provider Serafina Royals MD       Encounter Date: 08/02/2021  Check In:  Session Check In - 08/02/21 0918       Check-In   Supervising physician immediately available to respond to emergencies See telemetry face sheet for immediately available ER MD    Location [facility]-Cardiac & Pulmonary Rehab    Staff Present Justin Mend, RCP,RRT,BSRT;Undray Allman, MPA, RN;Melissa [place], RDN, LDN    Virtual Visit No    Medication changes reported     No    Fall or balance concerns reported    No    Tobacco Cessation No Change    Warm-up and Cool-down Performed on first and last piece of equipment    Resistance Training Performed Yes    VAD Patient? No    PAD/SET Patient? No      Pain Assessment   Currently in Pain? No/denies                Social History   Tobacco Use  Smoking Status Former   Packs/day: 0.00   Years: 0.00   Total pack years: 0.00   Types: Pipe, Cigarettes   Quit date: 12/26/2000   Years since quitting: 20.6  Smokeless Tobacco Current   Types: Chew  Tobacco Comments   quit over 30 years ago    Goals Met:  Independence with exercise equipment Exercise tolerated well No report of concerns or symptoms today Strength training completed today  Goals Unmet:  Not Applicable  Comments: Pt able to follow exercise prescription today without complaint.  Will continue to monitor for progression.    Dr. Emily Filbert is Medical Director for [place].  Dr. Ottie Glazier is Medical Director for [facility] Pulmonary Rehabilitation.

## 2021-08-07 DIAGNOSIS — I214 Non-ST elevation (NSTEMI) myocardial infarction: Secondary | ICD-10-CM

## 2021-08-07 DIAGNOSIS — Z48812 Encounter for surgical aftercare following surgery on the circulatory system: Secondary | ICD-10-CM | POA: Diagnosis not present

## 2021-08-07 DIAGNOSIS — Z955 Presence of coronary angioplasty implant and graft: Secondary | ICD-10-CM

## 2021-08-07 NOTE — Progress Notes (Signed)
Daily Session Note  Patient Details  Name: MALVERN KADLEC MRN: [id number] Date of Birth: November 28, 1928 Referring Provider:   Flowsheet Row Cardiac Rehab from 04/03/2021 in [facility] Cardiac and Pulmonary Rehab  Referring Provider Serafina Royals MD       Encounter Date: 08/07/2021  Check In:  Session Check In - 08/07/21 0924       Check-In   Supervising physician immediately available to respond to emergencies See telemetry face sheet for immediately available ER MD    Location [facility]-Cardiac & Pulmonary Rehab    Staff Present Will Bonnet, RN,BC,MSN;Kelly Amedeo Plenty, BS, ACSM CEP, Exercise Physiologist;Jessica Ernest, MA, RCEP, CCRP, CCET    Virtual Visit No    Medication changes reported     No    Fall or balance concerns reported    No    Tobacco Cessation No Change    Warm-up and Cool-down Performed on first and last piece of equipment    Resistance Training Performed Yes    VAD Patient? No    PAD/SET Patient? No      Pain Assessment   Currently in Pain? No/denies    Multiple Pain Sites No                Social History   Tobacco Use  Smoking Status Former   Packs/day: 0.00   Years: 0.00   Total pack years: 0.00   Types: Pipe, Cigarettes   Quit date: 12/26/2000   Years since quitting: 20.6  Smokeless Tobacco Current   Types: Chew  Tobacco Comments   quit over 30 years ago    Goals Met:  Independence with exercise equipment Exercise tolerated well No report of concerns or symptoms today  Goals Unmet:  Not Applicable  Comments: Pt able to follow exercise prescription today without complaint.  Will continue to monitor for progression.    Dr. Emily Filbert is Medical Director for [place].  Dr. Ottie Glazier is Medical Director for [facility] Pulmonary Rehabilitation.

## 2021-08-09 ENCOUNTER — Encounter: Payer: Medicare Other | Admitting: *Deleted

## 2021-08-09 DIAGNOSIS — Z48812 Encounter for surgical aftercare following surgery on the circulatory system: Secondary | ICD-10-CM | POA: Diagnosis not present

## 2021-08-09 DIAGNOSIS — I214 Non-ST elevation (NSTEMI) myocardial infarction: Secondary | ICD-10-CM

## 2021-08-09 DIAGNOSIS — Z955 Presence of coronary angioplasty implant and graft: Secondary | ICD-10-CM

## 2021-08-09 NOTE — Progress Notes (Signed)
Daily Session Note  Patient Details  Name: AREL TIPPEN MRN: [id number] Date of Birth: 1928/11/22 Referring Provider:   Flowsheet Row Cardiac Rehab from 04/03/2021 in [facility] Cardiac and Pulmonary Rehab  Referring Provider Serafina Royals MD       Encounter Date: 08/09/2021  Check In:  Session Check In - 08/09/21 0955       Check-In   Supervising physician immediately available to respond to emergencies See telemetry face sheet for immediately available ER MD    Location [facility]-Cardiac & Pulmonary Rehab    Staff Present Heath Lark, RN, BSN, CCRP;Kristen Coble, RN,BC,MSN;Melissa [place], RDN, LDN;Jessica [place], MA, RCEP, CCRP, CCET    Virtual Visit No    Medication changes reported     No    Fall or balance concerns reported    No    Warm-up and Cool-down Performed on first and last piece of equipment    Resistance Training Performed Yes    VAD Patient? No    PAD/SET Patient? No      Pain Assessment   Currently in Pain? No/denies                Social History   Tobacco Use  Smoking Status Former   Packs/day: 0.00   Years: 0.00   Total pack years: 0.00   Types: Pipe, Cigarettes   Quit date: 12/26/2000   Years since quitting: 20.6  Smokeless Tobacco Current   Types: Chew  Tobacco Comments   quit over 30 years ago    Goals Met:  Independence with exercise equipment Exercise tolerated well No report of concerns or symptoms today  Goals Unmet:  Not Applicable  Comments: Pt able to follow exercise prescription today without complaint.  Will continue to monitor for progression.    Dr. Emily Filbert is Medical Director for [place].  Dr. Ottie Glazier is Medical Director for [facility] Pulmonary Rehabilitation.

## 2021-08-14 DIAGNOSIS — I214 Non-ST elevation (NSTEMI) myocardial infarction: Secondary | ICD-10-CM

## 2021-08-14 DIAGNOSIS — Z48812 Encounter for surgical aftercare following surgery on the circulatory system: Secondary | ICD-10-CM | POA: Diagnosis not present

## 2021-08-14 DIAGNOSIS — Z955 Presence of coronary angioplasty implant and graft: Secondary | ICD-10-CM

## 2021-08-14 NOTE — Progress Notes (Unsigned)
Suprapubic Cath Change  Patient is present today for a suprapubic catheter change due to urinary retention.  8ml of water was drained from the balloon, a 16FR foley cath was removed from the tract with out difficulty.  Site was cleaned and prepped in a sterile fashion with betadine.  A 16FR foley cath was replaced into the tract no complications were noted. Urine return was noted, 10 ml of sterile water was inflated into the balloon and a leg bag was attached for drainage.  Patient tolerated well.    Performed by: Carman Ching, PA-C   Follow up: Return in about 6 weeks (around 09/26/2021) for SPT exchange.

## 2021-08-14 NOTE — Progress Notes (Signed)
Daily Session Note  Patient Details  Name: KAIREE ISA MRN: [id number] Date of Birth: July 18, 1928 Referring Provider:   Flowsheet Row Cardiac Rehab from 04/03/2021 in [facility] Cardiac and Pulmonary Rehab  Referring Provider Serafina Royals MD       Encounter Date: 08/14/2021  Check In:  Session Check In - 08/14/21 0917       Check-In   Supervising physician immediately available to respond to emergencies See telemetry face sheet for immediately available ER MD    Location [facility]-Cardiac & Pulmonary Rehab    Staff Present Earlean Shawl, BS, ACSM CEP, Exercise Physiologist;Nicolemarie Wooley, RN,BC,MSN;Jessica [place], MA, RCEP, CCRP, CCET;Melissa [place], RDN, LDN    Virtual Visit No    Medication changes reported     No    Fall or balance concerns reported    No    Tobacco Cessation No Change    Warm-up and Cool-down Performed on first and last piece of equipment    Resistance Training Performed Yes    VAD Patient? No    PAD/SET Patient? No      Pain Assessment   Currently in Pain? No/denies    Multiple Pain Sites No                Social History   Tobacco Use  Smoking Status Former   Packs/day: 0.00   Years: 0.00   Total pack years: 0.00   Types: Pipe, Cigarettes   Quit date: 12/26/2000   Years since quitting: 20.6  Smokeless Tobacco Current   Types: Chew  Tobacco Comments   quit over 30 years ago    Goals Met:  Independence with exercise equipment Exercise tolerated well No report of concerns or symptoms today Strength training completed today  Goals Unmet:  Not Applicable  Comments: Pt able to follow exercise prescription today without complaint.  Will continue to monitor for progression.    Dr. Emily Filbert is Medical Director for [place].  Dr. Ottie Glazier is Medical Director for [facility] Pulmonary Rehabilitation.

## 2021-08-15 ENCOUNTER — Ambulatory Visit: Payer: Medicare Other | Admitting: Physician Assistant

## 2021-08-15 DIAGNOSIS — Z9359 Other cystostomy status: Secondary | ICD-10-CM | POA: Diagnosis not present

## 2021-08-15 DIAGNOSIS — R339 Retention of urine, unspecified: Secondary | ICD-10-CM

## 2021-08-16 ENCOUNTER — Encounter: Payer: Medicare Other | Admitting: *Deleted

## 2021-08-16 DIAGNOSIS — Z955 Presence of coronary angioplasty implant and graft: Secondary | ICD-10-CM

## 2021-08-16 DIAGNOSIS — I214 Non-ST elevation (NSTEMI) myocardial infarction: Secondary | ICD-10-CM

## 2021-08-16 DIAGNOSIS — Z48812 Encounter for surgical aftercare following surgery on the circulatory system: Secondary | ICD-10-CM | POA: Diagnosis not present

## 2021-08-16 NOTE — Progress Notes (Signed)
Daily Session Note  Patient Details  Name: MAXTON NOREEN MRN: [id number] Date of Birth: 1928/08/25 Referring Provider:   Flowsheet Row Cardiac Rehab from 04/03/2021 in [facility] Cardiac and Pulmonary Rehab  Referring Provider Serafina Royals MD       Encounter Date: 08/16/2021  Check In:  Session Check In - 08/16/21 0955       Check-In   Supervising physician immediately available to respond to emergencies See telemetry face sheet for immediately available ER MD    Location [facility]-Cardiac & Pulmonary Rehab    Staff Present Heath Lark, RN, BSN, Jacklynn Bue, MS, ASCM CEP, Exercise Physiologist;Jessica Luan [place], MA, RCEP, CCRP, CCET    Virtual Visit No    Medication changes reported     No    Fall or balance concerns reported    No    Warm-up and Cool-down Performed on first and last piece of equipment    Resistance Training Performed Yes    VAD Patient? No    PAD/SET Patient? No      Pain Assessment   Currently in Pain? No/denies                Social History   Tobacco Use  Smoking Status Former   Packs/day: 0.00   Years: 0.00   Total pack years: 0.00   Types: Pipe, Cigarettes   Quit date: 12/26/2000   Years since quitting: 20.6  Smokeless Tobacco Current   Types: Chew  Tobacco Comments   quit over 30 years ago    Goals Met:  Independence with exercise equipment Exercise tolerated well No report of concerns or symptoms today  Goals Unmet:  Not Applicable  Comments: Pt able to follow exercise prescription today without complaint.  Will continue to monitor for progression.    Dr. Emily Filbert is Medical Director for [place].  Dr. Ottie Glazier is Medical Director for [facility] Pulmonary Rehabilitation.

## 2021-08-21 ENCOUNTER — Encounter: Payer: Medicare Other | Attending: Internal Medicine | Admitting: *Deleted

## 2021-08-21 DIAGNOSIS — Z955 Presence of coronary angioplasty implant and graft: Secondary | ICD-10-CM | POA: Insufficient documentation

## 2021-08-21 DIAGNOSIS — I214 Non-ST elevation (NSTEMI) myocardial infarction: Secondary | ICD-10-CM | POA: Insufficient documentation

## 2021-08-21 NOTE — Progress Notes (Signed)
Daily Session Note  Patient Details  Name: Shawn Andrews MRN: [id number] Date of Birth: September 03, 1928 Referring Provider:   Flowsheet Row Cardiac Rehab from 04/03/2021 in [facility] Cardiac and Pulmonary Rehab  Referring Provider Serafina Royals MD       Encounter Date: 08/21/2021  Check In:  Session Check In - 08/21/21 1039       Check-In   Supervising physician immediately available to respond to emergencies See telemetry face sheet for immediately available ER MD    Location [facility]-Cardiac & Pulmonary Rehab    Staff Present Heath Lark, RN, BSN, Laveda Norman, BS, ACSM CEP, Exercise Physiologist;Noah Tickle, BS, Exercise Physiologist    Virtual Visit No    Medication changes reported     No    Fall or balance concerns reported    No    Warm-up and Cool-down Performed on first and last piece of equipment    Resistance Training Performed Yes    VAD Patient? No    PAD/SET Patient? No      Pain Assessment   Currently in Pain? No/denies                Social History   Tobacco Use  Smoking Status Former   Packs/day: 0.00   Years: 0.00   Total pack years: 0.00   Types: Pipe, Cigarettes   Quit date: 12/26/2000   Years since quitting: 20.6  Smokeless Tobacco Current   Types: Chew  Tobacco Comments   quit over 30 years ago    Goals Met:  Independence with exercise equipment Exercise tolerated well No report of concerns or symptoms today  Goals Unmet:  Not Applicable  Comments: Pt able to follow exercise prescription today without complaint.  Will continue to monitor for progression.    Dr. Emily Filbert is Medical Director for [place].  Dr. Ottie Glazier is Medical Director for [facility] Pulmonary Rehabilitation.

## 2021-08-23 DIAGNOSIS — I214 Non-ST elevation (NSTEMI) myocardial infarction: Secondary | ICD-10-CM

## 2021-08-23 DIAGNOSIS — Z955 Presence of coronary angioplasty implant and graft: Secondary | ICD-10-CM

## 2021-08-23 NOTE — Progress Notes (Signed)
Daily Session Note  Patient Details  Name: Shawn Andrews MRN: [id number] Date of Birth: 04/05/1928 Referring Provider:   Flowsheet Row Cardiac Rehab from 04/03/2021 in [facility] Cardiac and Pulmonary Rehab  Referring Provider Serafina Royals MD       Encounter Date: 08/23/2021  Check In:  Session Check In - 08/23/21 0925       Check-In   Supervising physician immediately available to respond to emergencies See telemetry face sheet for immediately available ER MD    Location [facility]-Cardiac & Pulmonary Rehab    Staff Present Heath Lark, RN, BSN, CCRP;Dasan Hardman, BS, Exercise Physiologist;Melissa [place], RDN, Tawanna Solo, MS, ASCM CEP, Exercise Physiologist    Virtual Visit No    Medication changes reported     No    Fall or balance concerns reported    No    Warm-up and Cool-down Performed on first and last piece of equipment    Resistance Training Performed Yes    VAD Patient? No    PAD/SET Patient? No      Pain Assessment   Currently in Pain? No/denies    Multiple Pain Sites No                Social History   Tobacco Use  Smoking Status Former   Packs/day: 0.00   Years: 0.00   Total pack years: 0.00   Types: Pipe, Cigarettes   Quit date: 12/26/2000   Years since quitting: 20.6  Smokeless Tobacco Current   Types: Chew  Tobacco Comments   quit over 30 years ago    Goals Met:  Independence with exercise equipment Exercise tolerated well No report of concerns or symptoms today  Goals Unmet:  Not Applicable  Comments: Pt able to follow exercise prescription today without complaint.  Will continue to monitor for progression.    Dr. Emily Filbert is Medical Director for [place].  Dr. Ottie Glazier is Medical Director for [facility] Pulmonary Rehabilitation.

## 2021-08-24 ENCOUNTER — Other Ambulatory Visit: Payer: Medicare Other

## 2021-08-28 ENCOUNTER — Encounter: Payer: Medicare Other | Admitting: *Deleted

## 2021-08-28 DIAGNOSIS — I214 Non-ST elevation (NSTEMI) myocardial infarction: Secondary | ICD-10-CM

## 2021-08-28 DIAGNOSIS — Z955 Presence of coronary angioplasty implant and graft: Secondary | ICD-10-CM

## 2021-08-28 NOTE — Progress Notes (Signed)
Daily Session Note  Patient Details  Name: Shawn Andrews MRN: [id number] Date of Birth: [birth year] Referring Provider:   Flowsheet Row Cardiac Rehab from 04/03/2021 in [facility] Cardiac and Pulmonary Rehab  Referring Provider Serafina Royals MD       Encounter Date: 08/28/2021  Check In:      Social History   Tobacco Use  Smoking Status Former   Packs/day: 0.00   Years: 0.00   Total pack years: 0.00   Types: Pipe, Cigarettes   Quit date: 12/26/2000   Years since quitting: 20.6  Smokeless Tobacco Current   Types: Chew  Tobacco Comments   quit over 30 years ago    Goals Met:  Independence with exercise equipment Exercise tolerated well Strength training completed today  Goals Unmet:  Not Applicable  Comments: Pt able to follow exercise prescription today without complaint.  Will continue to monitor for progression.    Dr. Emily Filbert is Medical Director for [place].  Dr. Ottie Glazier is Medical Director for [facility] Pulmonary Rehabilitation.

## 2021-08-29 ENCOUNTER — Encounter: Payer: Self-pay | Admitting: *Deleted

## 2021-08-29 DIAGNOSIS — I214 Non-ST elevation (NSTEMI) myocardial infarction: Secondary | ICD-10-CM

## 2021-08-29 DIAGNOSIS — Z955 Presence of coronary angioplasty implant and graft: Secondary | ICD-10-CM

## 2021-08-29 NOTE — Progress Notes (Signed)
Cardiac Individual Treatment Plan  Patient Details  Name: Shawn Andrews MRN: [id number] Date of Birth: 1928-12-08 Referring Provider:   Flowsheet Row Cardiac Rehab from 04/03/2021 in [facility] Cardiac and Pulmonary Rehab  Referring Provider Serafina Royals MD       Initial Encounter Date:  Flowsheet Row Cardiac Rehab from 04/03/2021 in [facility] Cardiac and Pulmonary Rehab  Date 04/03/21       Visit Diagnosis: NSTEMI (non-ST elevated myocardial infarction) [facility])  Status post coronary artery stent placement  Patient's Home Medications on Admission:  Current Outpatient Medications:    acetaminophen (TYLENOL 8 HOUR) 650 MG CR tablet, Take 1 tablet (650 mg total) by mouth 2 (two) times a day., Disp: , Rfl:    Cholecalciferol (VITAMIN D3) 25 MCG (1000 UT) CAPS, Take 1 capsule (1,000 Units total) by mouth daily., Disp: 30 capsule, Rfl:    clopidogrel (PLAVIX) 75 MG tablet, Take 1 tablet by mouth daily., Disp: , Rfl:    Coenzyme Q10 (CO Q-10) 100 MG CAPS, Take 1 capsule by mouth daily. (Patient not taking: Reported on 02/25/2021), Disp: , Rfl: 0   cyanocobalamin 1000 MCG tablet, Take 1,000 mcg by mouth daily., Disp: , Rfl:    docusate sodium (COLACE) 100 MG capsule, Take 1 capsule (100 mg total) by mouth daily., Disp: , Rfl:    doxazosin (CARDURA) 4 MG tablet, TAKE 1 TABLET BY MOUTH  DAILY, Disp: 90 tablet, Rfl: 3   famotidine (PEPCID) 20 MG tablet, TAKE 1 TABLET BY MOUTH AT  BEDTIME, Disp: 90 tablet, Rfl: 3   finasteride (PROSCAR) 5 MG tablet, TAKE 1 TABLET BY MOUTH  DAILY, Disp: 90 tablet, Rfl: 3   furosemide (LASIX) 20 MG tablet, Take 1 tablet by mouth every other day., Disp: , Rfl:    isosorbide mononitrate (IMDUR) 30 MG 24 hr tablet, Take 1 tablet by mouth daily., Disp: , Rfl:    loratadine (CLARITIN) 10 MG tablet, Take 1 tablet (10 mg total) by mouth daily as needed for allergies., Disp: 30 tablet, Rfl:    metoprolol tartrate (LOPRESSOR) 50 MG tablet, Take 0.5 tablets (25 mg total) by mouth 2  (two) times daily., Disp: , Rfl:    nitroGLYCERIN (NITROSTAT) 0.4 MG SL tablet, Place under the tongue., Disp: , Rfl:    polyethylene glycol powder (GLYCOLAX/MIRALAX) 17 GM/SCOOP powder, Take 8.5-17 g by mouth daily as needed for moderate constipation., Disp: 3350 g, Rfl: 0   primidone (MYSOLINE) 50 MG tablet, Take 2 tablets (100 mg total) by mouth 3 (three) times daily., Disp: , Rfl:    rosuvastatin (CRESTOR) 5 MG tablet, Take 10 mg by mouth daily., Disp: , Rfl:    vitamin B-12 (CYANOCOBALAMIN) 500 MCG tablet, Take 1 tablet (500 mcg total) by mouth once a week., Disp: , Rfl:   Past Medical History: Past Medical History:  Diagnosis Date   Abnormal drug screen 06/22/2014   inapprop neg xanax rpt 3 mo (06/2014)   BPH (benign prostatic hypertrophy) 01/21/1998   has had 3 biopsies in past ([facility]) decided to stop PSA/DRE   CHF (congestive heart failure) (HCC)    Coronary artery disease    Hyperlipidemia 01/21/2002   Hypertension 05/22/2003   Left lumbar radiculopathy    Osteoarthritis 01/22/1988   knees, lumbar spondylosis and listhesis    Tobacco Use: Social History   Tobacco Use  Smoking Status Former   Packs/day: 0.00   Years: 0.00   Total pack years: 0.00   Types: Pipe, Cigarettes   Quit date:  12/26/2000   Years since quitting: 20.6  Smokeless Tobacco Current   Types: Chew  Tobacco Comments   quit over 30 years ago    Labs: Review Flowsheet  More data exists      Latest Ref Rng & Units 08/18/2018 08/25/2019 08/25/2020 01/17/2021 01/18/2021  Labs for ITP Cardiac and Pulmonary Rehab  Cholestrol 0 - 200 mg/dL 190  202  212  - 157   LDL (calc) 0 - 99 mg/dL 129  134  138  - 99   HDL-C >40 mg/dL 43.50  45.80  49.80  - 45   Trlycerides <150 mg/dL 88.0  110.0  119.0  - 66   Hemoglobin A1c 4.8 - 5.6 % - - - 5.0  -     Exercise Target Goals: Exercise Program Goal: Individual exercise prescription set using results from initial 6 min walk test and THRR while considering   patient's activity barriers and safety.   Exercise Prescription Goal: Initial exercise prescription builds to 30-45 minutes a day of aerobic activity, 2-3 days per week.  Home exercise guidelines will be given to patient during program as part of exercise prescription that the participant will acknowledge.   Education: Aerobic Exercise: - Group verbal and visual presentation on the components of exercise prescription. Introduces F.I.T.T principle from ACSM for exercise prescriptions.  Reviews F.I.T.T. principles of aerobic exercise including progression. Written material given at graduation. Flowsheet Row Cardiac Rehab from 08/09/2021 in [facility] Cardiac and Pulmonary Rehab  Education need identified 04/03/21       Education: Resistance Exercise: - Group verbal and visual presentation on the components of exercise prescription. Introduces F.I.T.T principle from ACSM for exercise prescriptions  Reviews F.I.T.T. principles of resistance exercise including progression. Written material given at graduation. Flowsheet Row Cardiac Rehab from 08/09/2021 in [facility] Cardiac and Pulmonary Rehab  Date 08/09/21  Educator [facility]  Instruction Review Code 1- Verbalizes Understanding        Education: Exercise & Equipment Safety: - Individual verbal instruction and demonstration of equipment use and safety with use of the equipment. Flowsheet Row Cardiac Rehab from 08/09/2021 in [facility] Cardiac and Pulmonary Rehab  Education need identified 04/03/21  Date 04/03/21  Educator [facility]  Instruction Review Code 1- Verbalizes Understanding       Education: Exercise Physiology & General Exercise Guidelines: - Group verbal and written instruction with models to review the exercise physiology of the cardiovascular system and associated critical values. Provides general exercise guidelines with specific guidelines to those with heart or lung disease.  Flowsheet Row Cardiac Rehab from 08/09/2021 in [facility] Cardiac and Pulmonary  Rehab  Education need identified 04/03/21       Education: Flexibility, Balance, Mind/Body Relaxation: - Group verbal and visual presentation with interactive activity on the components of exercise prescription. Introduces F.I.T.T principle from ACSM for exercise prescriptions. Reviews F.I.T.T. principles of flexibility and balance exercise training including progression. Also discusses the mind body connection.  Reviews various relaxation techniques to help reduce and manage stress (i.e. Deep breathing, progressive muscle relaxation, and visualization). Balance handout provided to take home. Written material given at graduation. Flowsheet Row Cardiac Rehab from 08/09/2021 in [facility] Cardiac and Pulmonary Rehab  Date 08/09/21  Educator [facility]  Instruction Review Code 1- Verbalizes Understanding       Activity Barriers & Risk Stratification:  Activity Barriers & Cardiac Risk Stratification - 04/03/21 0943       Activity Barriers & Cardiac Risk Stratification   Activity Barriers Deconditioning;Decreased Ventricular Function  Comments Suprapubic catheter- reports no limitation or problems with movement or activity    Cardiac Risk Stratification High             6 Minute Walk:  6 Minute Walk     Row Name 04/03/21 0944         6 Minute Walk   Phase Initial     Distance 1040 feet     Walk Time 6 minutes     # of Rest Breaks 0     MPH 1.96     METS 1.41     RPE 9     Perceived Dyspnea  0     VO2 Peak 4.93     Symptoms No     Resting HR 65 bpm     Resting BP 102/56     Resting Oxygen Saturation  98 %     Exercise Oxygen Saturation  during 6 min walk 94 %     Max Ex. HR 84 bpm     Max Ex. BP 132/62     2 Minute Post BP 100/58              Oxygen Initial Assessment:   Oxygen Re-Evaluation:   Oxygen Discharge (Final Oxygen Re-Evaluation):   Initial Exercise Prescription:  Initial Exercise Prescription - 04/03/21 1400       Date of Initial Exercise RX and  Referring Provider   Date 04/03/21    Referring Provider Serafina Royals MD      Oxygen   Maintain Oxygen Saturation 88% or higher      Treadmill   MPH 1.8    Grade 0.5    Minutes 15    METs 2.5      NuStep   Level 1    SPM 80    Minutes 15    METs 1.4      REL-XR   Level 1    Speed 50    Minutes 15    METs 1.4      Prescription Details   Frequency (times per week) 2-3   TBD   Duration Progress to 30 minutes of continuous aerobic without signs/symptoms of physical distress      Intensity   THRR 40-80% of Max Heartrate 91-117    Ratings of Perceived Exertion 11-13    Perceived Dyspnea 0-4      Progression   Progression Continue to progress workloads to maintain intensity without signs/symptoms of physical distress.      Resistance Training   Training Prescription Yes    Weight 3 lb    Reps 10-15             Perform Capillary Blood Glucose checks as needed.  Exercise Prescription Changes:   Exercise Prescription Changes     Row Name 04/03/21 1400 06/04/21 0800 06/05/21 0900 06/19/21 1600 07/02/21 1500     Response to Exercise   Blood Pressure (Admit) 102/56 112/58 -- 108/62 122/58   Blood Pressure (Exercise) 132/62 122/76 -- 124/60 --   Blood Pressure (Exit) 100/58 124/60 -- 126/64 112/60   Heart Rate (Admit) 65 bpm 75 bpm -- 63 bpm 71 bpm   Heart Rate (Exercise) 84 bpm 77 bpm -- 97 bpm 87 bpm   Heart Rate (Exit) 66 bpm 65 bpm -- 68 bpm 69 bpm   Oxygen Saturation (Admit) 98 % -- -- -- --   Oxygen Saturation (Exercise) 94 % -- -- -- --   Oxygen Saturation (Exit) 96 % -- -- -- --  Rating of Perceived Exertion (Exercise) 9 13 -- 13 12   Perceived Dyspnea (Exercise) 0 -- -- -- --   Symptoms none none -- none none   Comments walk test results second full day of exercise -- -- --   Duration -- Progress to 30 minutes of  aerobic without signs/symptoms of physical distress -- Continue with 30 min of aerobic exercise without signs/symptoms of physical  distress. Continue with 30 min of aerobic exercise without signs/symptoms of physical distress.   Intensity -- THRR unchanged -- THRR unchanged THRR unchanged     Progression   Progression -- Continue to progress workloads to maintain intensity without signs/symptoms of physical distress. -- Continue to progress workloads to maintain intensity without signs/symptoms of physical distress. Continue to progress workloads to maintain intensity without signs/symptoms of physical distress.   Average METs -- 2.29 -- 2.86 3.26     Resistance Training   Training Prescription -- Yes -- Yes Yes   Weight -- 3 lb -- 3 lb 6 lb   Reps -- 10-15 -- 10-15 10-15     Interval Training   Interval Training -- No -- No No     Treadmill   MPH -- 1.9 -- 2.1 2.1   Grade -- 0.5 -- 0.5 0.5   Minutes -- 15 -- 15 15   METs -- 259 -- 2.75 2.75     Recumbant Bike   Level -- -- -- 1 3   Watts -- -- -- -- 27   Minutes -- -- -- 15 15   METs -- -- -- 1.9 2.98     Arm Ergometer   Level -- 1 -- -- --   Minutes -- 15 -- -- --     REL-XR   Level -- 3 -- -- 1   Minutes -- 15 -- -- 15   METs -- -- -- -- 5.2     T5 Nustep   Level -- -- -- 3 3   Minutes -- -- -- 15 15   METs -- -- -- 2.4 2.4     Biostep-RELP   Level -- -- -- 2 2   Minutes -- -- -- 15 15   METs -- -- -- 3 3     Track   Laps -- -- -- 18 --   Minutes -- 15 -- 15 --   METs -- -- -- 1.98 --     Home Exercise Plan   Plans to continue exercise at -- -- [employer] (comment)  [employer] (treadmill, bike, [employer] machines) Forensic scientist (comment)  [employer] (treadmill, bike, [employer]) Forensic scientist (comment)  [employer] (treadmill, bike, [employer])   Frequency -- -- Add 3 additional days to program exercise sessions. Add 3 additional days to program exercise sessions. Add 3 additional days to program exercise sessions.   Initial Home Exercises Provided -- -- 06/05/21 06/05/21 06/05/21     Oxygen   Maintain Oxygen Saturation -- 88% or  higher 88% or higher 88% or higher 88% or higher    Row Name 07/16/21 0700 07/30/21 0800 08/13/21 0900 08/27/21 0800       Response to Exercise   Blood Pressure (Admit) 102/60 112/60 104/56 122/56    Blood Pressure (Exit) 100/56 124/62 112/60 114/60    Heart Rate (Admit) 59 bpm 58 bpm 57 bpm 54 bpm    Heart Rate (Exercise) 95 bpm 84 bpm 73 bpm 79 bpm    Heart Rate (Exit) 62 bpm 63 bpm 61  bpm 64 bpm    Rating of Perceived Exertion (Exercise) _0 Symptoms none none none none    Duration Continue with 30 min of aerobic exercise without signs/symptoms of physical distress. Continue with 30 min of aerobic exercise without signs/symptoms of physical distress. Continue with 30 min of aerobic exercise without signs/symptoms of physical distress. Continue with 30 min of aerobic exercise without signs/symptoms of physical distress.    Intensity THRR unchanged THRR New  85-115 THRR unchanged THRR unchanged      Progression   Progression Continue to progress workloads to maintain intensity without signs/symptoms of physical distress. Continue to progress workloads to maintain intensity without signs/symptoms of physical distress. Continue to progress workloads to maintain intensity without signs/symptoms of physical distress. Continue to progress workloads to maintain intensity without signs/symptoms of physical distress.    Average METs 2.62 2.67 2.41 2.32      Resistance Training   Training Prescription Yes Yes Yes Yes    Weight 6 lb 6 lb 6 lb 6 lb    Reps 10-15 10-15 10-15 10-15      Interval Training   Interval Training No No No No      Recumbant Bike   Level _1 Watts _2 --    Minutes _3 METs 2.96 2.96 -- 3.1      NuStep   Level _4 Minutes _5 METs 2.6 -- 3 --      T5 Nustep   Level _6 Minutes _7 --    METs 2.1 2.1 2 --      Biostep-RELP   Level _8 Minutes _9 METs _10 Track    Laps _11 Minutes _12 METs 2.63 2.63 2.63 2.63      Home Exercise Plan   Plans to continue exercise at [employer] (comment)  [employer] (treadmill, bike, [employer] machines) Forensic scientist (comment)  [employer] (treadmill, bike, [employer]) Forensic scientist (comment)  [employer] (treadmill, bike, [employer]) Forensic scientist (comment)  [employer] (treadmill, bike, [employer])    Frequency Add 3 additional days to program exercise sessions. Add 3 additional days to program exercise sessions. Add 3 additional days to program exercise sessions. Add 3 additional days to program exercise sessions.    Initial Home Exercises Provided 06/05/21 06/05/21 06/05/21 06/05/21      Oxygen   Maintain Oxygen Saturation 88% or higher 88% or higher 88% or higher 88% or higher             Exercise Comments:   Exercise Comments     Row Name 05/29/21 0934           Exercise Comments First full day of exercise!  Patient was oriented to gym and equipment including functions, settings, policies, and procedures.  Patient's individual exercise prescription and treatment plan were reviewed.  All starting workloads were established based on the results of the 6 minute walk test done at initial orientation visit.  The plan for exercise progression was also introduced and progression will be customized based on patient's performance and goals.  Exercise Goals and Review:   Exercise Goals     Row Name 04/03/21 0945             Exercise Goals   Increase Physical Activity Yes       Intervention Provide advice, education, support and counseling about physical activity/exercise needs.;Develop an individualized exercise prescription for aerobic and resistive training based on initial evaluation findings, risk stratification, comorbidities and participant's personal goals.       Expected Outcomes Short Term: Attend rehab on a regular basis to increase amount of  physical activity.;Long Term: Add in home exercise to make exercise part of routine and to increase amount of physical activity.;Long Term: Exercising regularly at least 3-5 days a week.       Increase Strength and Stamina Yes       Intervention Provide advice, education, support and counseling about physical activity/exercise needs.;Develop an individualized exercise prescription for aerobic and resistive training based on initial evaluation findings, risk stratification, comorbidities and participant's personal goals.       Expected Outcomes Short Term: Increase workloads from initial exercise prescription for resistance, speed, and METs.;Short Term: Perform resistance training exercises routinely during rehab and add in resistance training at home;Long Term: Improve cardiorespiratory fitness, muscular endurance and strength as measured by increased METs and functional capacity (6MWT)       Able to understand and use rate of perceived exertion (RPE) scale Yes       Intervention Provide education and explanation on how to use RPE scale       Expected Outcomes Short Term: Able to use RPE daily in rehab to express subjective intensity level;Long Term:  Able to use RPE to guide intensity level when exercising independently       Able to understand and use Dyspnea scale Yes       Intervention Provide education and explanation on how to use Dyspnea scale       Expected Outcomes Short Term: Able to use Dyspnea scale daily in rehab to express subjective sense of shortness of breath during exertion;Long Term: Able to use Dyspnea scale to guide intensity level when exercising independently       Knowledge and understanding of Target Heart Rate Range (THRR) Yes       Intervention Provide education and explanation of THRR including how the numbers were predicted and where they are located for reference       Expected Outcomes Short Term: Able to use daily as guideline for intensity in rehab;Short Term: Able to  state/look up THRR;Long Term: Able to use THRR to govern intensity when exercising independently       Able to check pulse independently Yes       Intervention Provide education and demonstration on how to check pulse in carotid and radial arteries.;Review the importance of being able to check your own pulse for safety during independent exercise       Expected Outcomes Short Term: Able to explain why pulse checking is important during independent exercise;Long Term: Able to check pulse independently and accurately       Understanding of Exercise Prescription Yes       Intervention Provide education, explanation, and written materials on patient's individual exercise prescription       Expected Outcomes Short Term: Able to explain program exercise prescription;Long Term: Able to explain home exercise prescription to exercise independently                Exercise Goals Re-Evaluation :  Exercise Goals Re-Evaluation     Row Name 05/29/21 0934 06/04/21 0849 06/05/21 0949 06/19/21 0947 06/19/21 1631     Exercise Goal Re-Evaluation   Exercise Goals Review Increase Physical Activity;Able to understand and use rate of perceived exertion (RPE) scale;Understanding of Exercise Prescription;Knowledge and understanding of Target Heart Rate Range (THRR);Increase Strength and Stamina;Able to understand and use Dyspnea scale;Able to check pulse independently Increase Physical Activity;Increase Strength and Stamina;Understanding of Exercise Prescription Increase Physical Activity;Increase Strength and Stamina;Understanding of Exercise Prescription Increase Physical Activity;Increase Strength and Stamina;Understanding of Exercise Prescription Increase Physical Activity;Increase Strength and Stamina;Understanding of Exercise Prescription   Comments Reviewed RPE and dyspnea scales, THR and program prescription with pt today.  Pt voiced understanding and was given a copy of goals to take home. Sherrel is off to a good  start in rehab.  He has completed his first two full days of rehab.  We will continue to montior his progress. Reviewed home exercise with pt today.  Pt plans to workout at the [facility] for exercise. He is already exercising on the bike, treadmill, and doing weight machines. Reviewed THR, pulse, RPE, sign and symptoms, pulse oximetery and when to call 911 or MD.  Also discussed weather considerations and indoor options.  Pt voiced understanding. Mykell is doing well in rehab.  He feels like his strength is starting to come back. He has been going to the [facility] for exercise on his off days.  He is also staying active at home with chores his wifes has for him. He is able to do them now without running out of stamina. Audric is continuing to do well in rehab. He has increased his TM speed to 2.1 mph and is already at level 3 on the T5 Nustep. All RPEs are in appropriate range. He would benefit from increasing his level on the recumbant bike. Will continue to monitor.   Expected Outcomes Short: Use RPE daily to regulate intensity. Long: Follow program prescription in THR. Short: Count laps on track and attend regularly  Long: Continue to follow program prescription Short: continue to monitor HR using THR Long: Continue to exercise at home independently Short; Continue to go to [facility] on off days Long: Continue to improve stamina Short: Increase level on RB Long: Continue to increase overall MET level    Row Name 07/02/21 1505 07/16/21 0735 07/17/21 0928 07/30/21 0833 08/09/21 0938     Exercise Goal Re-Evaluation   Exercise Goals Review Increase Physical Activity;Increase Strength and Stamina;Understanding of Exercise Prescription Increase Physical Activity;Increase Strength and Stamina;Understanding of Exercise Prescription Increase Physical Activity;Increase Strength and Stamina;Understanding of Exercise Prescription Increase Physical Activity;Increase Strength and Stamina;Understanding of Exercise Prescription Increase  Physical Activity;Increase Strength and Stamina;Understanding of Exercise Prescription   Comments Abdulaziz is doing well in rehab.  He is up to 27 watts on the bike and using 6 lb hand weights.  We will continue to monitor his progress. Ankith is doing well in rehab. He recently improved to level 4 on the T5 machine and level 3 on the Biostep. He increased to 30 laps on the track also. He has tolerated using 6 lb hand weights for resistance training as well. We will continue to monitor his progress in the program. Daschel is doing well in rehab.  He is going to the [facility] on his off days for 30 min on the bike and treadmill.  He is starting to feel stronger and has some more stamina. He is about half way through the program  already. Lorcan continues to do well in rehab.  He is up to using 6 lb handweights.  He likes to break up his time on the track and will do two rounds of 15 laps  (15 min) with the NuStep in the middle so as not to aggravate his hip as much.  We will continue to monitor his progress. Shawan reports working in the yard, sweeping the house. He goes to the [facility] for structured exercise (bike and treadmill - 1x/week for 30 minutes per session). Encouraged him to exercise at least 2x/week when not at rehab. He takes breaks while exercising here and at home when ealking due to sciatic pain.   Expected Outcomes Short: Back up to level 4 on T5 Long; Conitnue to improve stamina Short: increase level on T4 Long: Continue to improve MET levels Short:Conitnue to go to [facility] on off days Long; Conintue to improve stamina Short: Continue to move up seated equipment Long: Continue to improve stamina on the track Short:Conitnue to go to [facility] on off days Long; Conintue to improve stamina    Row Name 08/13/21 0903 08/27/21 0834           Exercise Goal Re-Evaluation   Exercise Goals Review Increase Physical Activity;Increase Strength and Stamina;Understanding of Exercise Prescription Increase Physical Activity;Increase  Strength and Stamina;Understanding of Exercise Prescription      Comments Nollie continues to do well. He has increased his levels on both the recumbent bike to 5 and T4 Nustep to 4. He worked up to 29 watts on the bike which has been his highest so far. His THR zone was re-calculated and we hope to see him reach that during his sessions. Will continue to monitor. Ewell continues to do well in rehab. He has improved to level 4 on the biostep. He has also improved to level 5 on the T4. Reiss has consistently been able to reach 30 laps on the track as well. He has tolerated 6 lb hand weights for resistance training as well. We will continue to monitor his progress in the program.      Expected Outcomes Short: Try to hit new THR Long: Continue to increase overall MET level Short: Continue to push for mor laps on track. Long: Continue to increase strength and stamina.               Discharge Exercise Prescription (Final Exercise Prescription Changes):  Exercise Prescription Changes - 08/27/21 0800       Response to Exercise   Blood Pressure (Admit) 122/56    Blood Pressure (Exit) 114/60    Heart Rate (Admit) 54 bpm    Heart Rate (Exercise) 79 bpm    Heart Rate (Exit) 64 bpm    Rating of Perceived Exertion (Exercise) 14    Symptoms none    Duration Continue with 30 min of aerobic exercise without signs/symptoms of physical distress.    Intensity THRR unchanged      Progression   Progression Continue to progress workloads to maintain intensity without signs/symptoms of physical distress.    Average METs 2.32      Resistance Training   Training Prescription Yes    Weight 6 lb    Reps 10-15      Interval Training   Interval Training No      Recumbant Bike   Level 5    Minutes 15    METs 3.1      NuStep   Level 5    Minutes 15  T5 Nustep   Level 4      Biostep-RELP   Level 4    Minutes 15    METs 2      Track   Laps 30    Minutes 15    METs 2.63      Home Exercise  Plan   Plans to continue exercise at [employer] (comment)   [employer] (treadmill, bike, weight machines)   Frequency Add 3 additional days to program exercise sessions.    Initial Home Exercises Provided 06/05/21      Oxygen   Maintain Oxygen Saturation 88% or higher             Nutrition:  Target Goals: Understanding of nutrition guidelines, daily intake of sodium <1514m, cholesterol <2037m calories 30% from fat and 7% or less from saturated fats, daily to have 5 or more servings of fruits and vegetables.  Education: All About Nutrition: -Group instruction provided by verbal, written material, interactive activities, discussions, models, and posters to present general guidelines for heart healthy nutrition including fat, fiber, MyPlate, the role of sodium in heart healthy nutrition, utilization of the nutrition label, and utilization of this knowledge for meal planning. Follow up email sent as well. Written material given at graduation. [facility]  Education need identified 04/03/21       Biometrics:  Pre Biometrics - 04/03/21 0943       Pre Biometrics   Height 5' 10.5" (1.791 m)    Weight 178 lb (80.7 kg)    BMI (Calculated) 25.17    Single Leg Stand 1.94 seconds              Nutrition Therapy Plan and Nutrition Goals:  Nutrition Therapy & Goals - 08/09/21 0933       Nutrition Therapy   RD appointment deferred Yes             Nutrition Assessments:  MEDIFICTS Score Key: ?70 Need to make dietary changes  40-70 Heart Healthy Diet ? 40 Therapeutic Level Cholesterol Diet  [facility]  Picture Your Plate Total Score on Admission 86      Picture Your Plate Scores: <4<00nhealthy dietary pattern with much room for improvement. 41-50 Dietary pattern unlikely to meet recommendations for good health and room for  improvement. 51-60 More healthful dietary pattern, with some room for improvement.  >60 Healthy dietary pattern, although there may be some specific behaviors that could be improved.    Nutrition Goals Re-Evaluation:  Nutrition Goals Re-Evaluation     [place] 06/19/21 [id number]/27/23 0931 08/09/21 0933         Goals   Nutrition Goal Pt would not like to meet with RD as he feels his wife has him on a good diet. Will continue to follow up. Pt would not like to meet with RD as he feels his wife has him on a good diet. Will continue to follow up. Pt would not like to meet with RD as he feels his wife has him on a good diet. He reports no issues at this time. Will continue to follow up.     Comment BaAvas doing well in rehab.  He feels that he and his wife are following a good diet.  He says that she keeps him on a strict healthy diet to keep him good.  They stay away from salt  and she has broken his habit of using salt.  He has backed off sugary sweets.  At one point, he was over 200 lb, but they have worked on it.  They eat a lot of chicken and vegetables.  They are enjoying the fruits coming into season now. Quintyn is doing well in rehab. He and his wife do well at watching his diet.  She keeps him eating healthy. --     Expected Outcome Short: Continue to get a good variety Long; Continue to focus on heart healthy eating. Short: Continue to get a good variety Long; Continue to focus on heart healthy eating. --              Nutrition Goals Discharge (Final Nutrition Goals Re-Evaluation):  Nutrition Goals Re-Evaluation - 08/09/21 0933       Goals   Nutrition Goal Pt would not like to meet with RD as he feels his wife has him on a good diet. He reports no issues at this time. Will continue to follow up.             Psychosocial: Target Goals: Acknowledge presence or absence of significant depression and/or stress, maximize coping skills, provide positive support system. Participant is  able to verbalize types and ability to use techniques and skills needed for reducing stress and depression.   Education: Stress, Anxiety, and Depression - Group verbal and visual presentation to define topics covered.  Reviews how body is impacted by stress, anxiety, and depression.  Also discusses healthy ways to reduce stress and to treat/manage anxiety and depression.  Written material given at graduation.   Education: Sleep Hygiene -Provides group verbal and written instruction about how sleep can affect your health.  Define sleep hygiene, discuss sleep cycles and impact of sleep habits. Review good sleep hygiene tips.    Initial Review & Psychosocial Screening:  Initial Psych Review & Screening - 03/21/21 1412       Initial Review   Current issues with None Identified      Family Dynamics   Good Support System? Yes   wife     Barriers   Psychosocial barriers to participate in program There are no identifiable barriers or psychosocial needs.      Screening Interventions   Interventions Encouraged to exercise;To provide support and resources with identified psychosocial needs    Expected Outcomes Long Term Goal: Stressors or current issues are controlled or eliminated.;Short Term goal: Utilizing psychosocial counselor, staff and physician to assist with identification of specific Stressors or current issues interfering with healing process. Setting desired goal for each stressor or current issue identified.;Short Term goal: Identification and review with participant of any Quality of Life or Depression concerns found by scoring the questionnaire.;Long Term goal: The participant improves quality of Life and PHQ9 Scores as seen by post scores and/or verbalization of changes             Quality of Life Scores:   Quality of Life - 04/03/21 0933       Quality of Life   Select Quality of Life      Quality of Life Scores   Health/Function Pre 26.57 %    Socioeconomic Pre 25.63 %     Psych/Spiritual Pre 29.64 %    Family Pre 28.8 %    GLOBAL Pre 27.29 %            Scores of 19 and below usually indicate a poorer quality of life in these areas.  A difference of  2-3 points is a clinically meaningful difference.  A difference of 2-3 points in the total score of the Quality of Life Index has been associated with significant improvement in overall quality of life, self-image, physical symptoms, and general health in studies assessing change in quality of life.  PHQ-9: Review Flowsheet  More data exists      04/03/2021 03/16/2021 02/01/2021 08/30/2020 08/25/2019  Depression screen PHQ 2/9  Decreased Interest 0 0 0 0 0  Down, Depressed, Hopeless 0 0 0 0 0  PHQ - 2 Score 0 0 0 0 0  Altered sleeping 1 - - - -  Tired, decreased energy 1 - - - -  Change in appetite 0 - - - -  Feeling bad or failure about yourself  0 - - - -  Trouble concentrating 0 - - - -  Moving slowly or fidgety/restless 0 - - - -  Suicidal thoughts 0 - - - -  PHQ-9 Score 2 - - - -  Difficult doing work/chores Not difficult at all - - - -   Interpretation of Total Score  Total Score Depression Severity:  1-4 = Minimal depression, 5-9 = Mild depression, 10-14 = Moderate depression, 15-19 = Moderately severe depression, 20-27 = Severe depression   Psychosocial Evaluation and Intervention:  Psychosocial Evaluation - 03/21/21 1412       Psychosocial Evaluation & Interventions   Comments Mr. Kempker wants to come in person to see the gym and to see if this is something he is interested in. [facility], and lift some weights. He is looking to get back into that routine but doesn't know if he needs this program specifically. He has had recent hospital admissions related to urinary retention and recently had a suprapubic catheter placed per notes. He states he has been feeling well. His wife helps manage his health care.    Expected Outcomes Short: decide on  whether to attend cardiac rehab [facility]. Long: maintain positive self care habits.    Continue Psychosocial Services  Follow up required by staff             Psychosocial Re-Evaluation:  Psychosocial Re-Evaluation     [place] Name 06/19/21 [id number] 07/17/21 0930 08/09/21 0939         Psychosocial Re-Evaluation   Current issues with None Identified None Identified None Identified     Comments Arden is doing well in [place].  He feels good mentally.  His finances are in check and he does not feel like he has any major stressors currently.  He is sleeping good.  He occasionnaly will have rough nights, but overall sleeps good.  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  He exercises routinely for mental boost. He has enjoyed being supervised again to help move up more. Lakendrick is doing well in rehab.  He is good mentally and denies any major stressors.  He sleeps well for most part.  He is exercising at the [facility] on his off days to keep him boosted.  He is able to go and do what he wants.  His biggest limitation is his sciatic nerve pain.  As long as he keeps moving it helps.  He is also active in the [employer]. Benuel continues to do well in rehab. He reports not  having any major stressors at this time. He enjoys watching baseball and sports in general. He relies on his wife for support and she has been helping with his diet. He reports sleeping well in general; usually goes to bed at 11pm-630 or 7am.     Expected Outcomes Short: Continue to exercise for mental boost Long: continue to stay positive Short; Continue to exercise for mental boost Long; Conitnue to keep moving. Short; Continue to exercise for mental boost Long; Conitnue to keep moving.     Interventions Encouraged to attend Cardiac Rehabilitation for the exercise Encouraged to attend Cardiac Rehabilitation for the exercise Encouraged to attend Cardiac Rehabilitation  for the exercise     Continue Psychosocial Services  Follow up required by staff -- Follow up required by staff              Psychosocial Discharge (Final Psychosocial Re-Evaluation):  Psychosocial Re-Evaluation - 08/09/21 0939       Psychosocial Re-Evaluation   Current issues with None Identified    Comments Jaydee continues to do well in rehab. He reports not having any major stressors at this time. He enjoys watching baseball and sports in general. He relies on his wife for support and she has been helping with his diet. He reports sleeping well in general; usually goes to bed at 11pm-630 or 7am.    Expected Outcomes Short; Continue to exercise for mental boost Long; Conitnue to keep moving.    Interventions Encouraged to attend Cardiac Rehabilitation for the exercise    Continue Psychosocial Services  Follow up required by staff             Vocational Rehabilitation: Provide vocational rehab assistance to qualifying candidates.   Vocational Rehab Evaluation & Intervention:  Vocational Rehab - 03/21/21 1412       Initial Vocational Rehab Evaluation & Intervention   Assessment shows need for Vocational Rehabilitation No             Education: Education Goals: Education classes will be provided on a variety of topics geared toward better understanding of heart health and risk factor modification. Participant will state understanding/return demonstration of topics presented as noted by education test scores.  Learning Barriers/Preferences:  Learning Barriers/Preferences - 03/21/21 1412       Learning Barriers/Preferences   Learning Preferences Individual Instruction             General Cardiac Education Topics:  AED/CPR: - Group verbal and written instruction with the use of models to demonstrate the basic use of the AED with the basic ABC's of resuscitation.   Anatomy and Cardiac Procedures: - Group verbal and visual presentation and models provide  information about basic cardiac anatomy and function. Reviews the testing methods done to diagnose heart disease and the outcomes of the test results. Describes the treatment choices: Medical Management, Angioplasty, or Coronary Bypass Surgery for treating various heart conditions including Myocardial Infarction, Angina, Valve Disease, and Cardiac Arrhythmias.  Written material given at graduation. Flowsheet Row Cardiac Rehab from 08/09/2021 in [facility] Cardiac [facility]  Education need identified 04/03/21       Medication Safety: - Group verbal and visual instruction to review commonly prescribed medications for heart and lung disease. Reviews the medication, class of the drug, and side effects. Includes the steps to properly store meds and maintain the prescription regimen.  Written material given at graduation.   Intimacy: - Group verbal instruction through game format to discuss how heart and lung disease  can affect sexual intimacy. Written material given at graduation..   Know Your Numbers and Heart Failure: - Group verbal and visual instruction to discuss disease risk factors for cardiac and pulmonary disease and treatment options.  Reviews associated critical values for Overweight/Obesity, Hypertension, Cholesterol, and Diabetes.  Discusses basics of heart failure: signs/symptoms and treatments.  Introduces Heart Failure Zone chart for action plan for heart failure.  Written material given at graduation.   Infection Prevention: - Provides verbal and written material to individual with discussion of infection control including proper hand washing and proper equipment cleaning during exercise session. Flowsheet Row Cardiac Rehab from 08/09/2021 in [facility] Cardiac and Pulmonary Rehab  Education need identified 04/03/21  Date 04/03/21  Educator [place]  Instruction Review Code 1- Verbalizes Understanding       Falls Prevention: - Provides verbal and written material to individual with  discussion of falls prevention and safety. Flowsheet Row Cardiac Rehab from 08/09/2021 in [facility] Cardiac and Pulmonary Rehab  Education need identified 04/03/21  Date 04/03/21  Educator [place]  Instruction Review Code 1- Verbalizes Understanding       Other: -Provides group and verbal instruction on various topics (see comments)   Knowledge Questionnaire Score:  Knowledge Questionnaire Score - 04/03/21 0935       Knowledge Questionnaire Score   Pre Score 22/26: Exercise, Nutrition, Angina, Nitro             Core Components/Risk Factors/Patient Goals at Admission:  Personal Goals and Risk Factors at Admission - 04/03/21 0945       Core Components/Risk Factors/Patient Goals on Admission    Weight Management Yes;Weight Maintenance    Intervention Weight Management: Develop a combined nutrition and exercise program designed to reach desired caloric intake, while maintaining appropriate intake of nutrient and fiber, sodium and fats, and appropriate energy expenditure required for the weight goal.;Weight Management: Provide education and appropriate resources to help participant work on and attain dietary goals.;Weight Management/Obesity: Establish reasonable short term and long term weight goals.    Admit Weight 178 lb (80.7 kg)    Goal Weight: Short Term 178 lb (80.7 kg)    Goal Weight: Long Term 178 lb (80.7 kg)    Expected Outcomes Short Term: Continue to assess and modify interventions until short term weight is achieved;Long Term: Adherence to nutrition and physical activity/exercise program aimed toward attainment of established weight goal;Weight Maintenance: Understanding of the daily nutrition guidelines, which includes 25-35% calories from fat, 7% or less cal from saturated fats, less than 227m cholesterol, less than 1.5gm of sodium, & 5 or more servings of fruits and vegetables daily;Understanding recommendations for meals to include 15-35% energy as protein, 25-35% energy from  fat, 35-60% energy from carbohydrates, less than 2060mof dietary cholesterol, 20-35 gm of total fiber daily;Understanding of distribution of calorie intake throughout the day with the consumption of 4-5 meals/snacks    Heart Failure Yes    Intervention Provide a combined exercise and nutrition program that is supplemented with education, support and counseling about heart failure. Directed toward relieving symptoms such as shortness of breath, decreased exercise tolerance, and extremity edema.    Expected Outcomes Improve functional capacity of life;Short term: Attendance in program 2-3 days a week with increased exercise capacity. Reported lower sodium intake. Reported increased fruit and vegetable intake. Reports medication compliance.;Long term: Adoption of self-care skills and reduction of barriers for early signs and symptoms recognition and intervention leading to self-care maintenance.;Short term: Daily weights obtained and reported for increase.  Utilizing diuretic protocols set by physician.    Hypertension Yes    Intervention Provide education on lifestyle modifcations including regular physical activity/exercise, weight management, moderate sodium restriction and increased consumption of fresh fruit, vegetables, and low fat dairy, alcohol moderation, and smoking cessation.;Monitor prescription use compliance.    Expected Outcomes Short Term: Continued assessment and intervention until BP is < 140/60m HG in hypertensive participants. < 130/874mHG in hypertensive participants with diabetes, heart failure or chronic kidney disease.;Long Term: Maintenance of blood pressure at goal levels.    Lipids Yes    Intervention Provide education and support for participant on nutrition & aerobic/resistive exercise along with prescribed medications to achieve LDL <7060mHDL >42m26m  Expected Outcomes Short Term: Participant states understanding of desired cholesterol values and is compliant with medications  prescribed. Participant is following exercise prescription and nutrition guidelines.;Long Term: Cholesterol controlled with medications as prescribed, with individualized exercise RX and with personalized nutrition plan. Value goals: LDL < 70mg75mL > 40 mg.             Education:Diabetes - Individual verbal and written instruction to review signs/symptoms of diabetes, desired ranges of glucose level fasting, after meals and with exercise. Acknowledge that pre and post exercise glucose checks will be done for 3 sessions at entry of program.   Core Components/Risk Factors/Patient Goals Review:   Goals and Risk Factor Review     Row Name 06/19/21 0953 07/17/21 0933 08/09/21 0937         Core Components/Risk Factors/Patient Goals Review   Personal Goals Review Weight Management/Obesity;Hypertension;Heart Failure Weight Management/Obesity;Hypertension;Heart Failure Weight Management/Obesity;Hypertension;Heart Failure     Review Jerman Baderoing well in rehab.  His weight is doing well as they continue to eat well. He has not had any heart failure symptoms.  His blood pressures have been doing well in class.  he does not check them at home, unless something is feeling off. Unknown Lajarvisoing well in rehab.  His weight is holding steady and his wife watches his diet.  He tries to stay active.  His pressures are doing well and he has been checking them at home along with his weight twice a day.  He has not noted any heart failure symptoms. Ryken Jaquilleinues to do well in rehab. His weight is holding steady and his wife watches his diet; he checks his weight daily as well. He tries to stay [facility] and in the yard. 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 He has not noted any heart failure symptoms. He is taking his medications as prescribed with no issues.     Expected Outcomes Shrot: Continue  to maintain weight Long: Continue to manage heart failure Short: COnitnue to track pressures Long: COnitnue to monitor risk factors. Short: Continue to check BP and weight at home Long: Conitnue to monitor risk factors.              Core Components/Risk Factors/Patient Goals at Discharge (Final Review):   Goals and Risk Factor Review - 08/09/21 0937       Core Components/Risk Factors/Patient Goals Review   Personal Goals Review Weight Management/Obesity;Hypertension;Heart Failure    Review Casper Sammieinues to do well in rehab. His weight is holding steady and his wife watches his diet; he checks his weight daily as well. He tries to stay active  going to the [facility] and will work around the house and in the yard. 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 He has not noted any heart failure symptoms. He is taking his medications as prescribed with no issues.    Expected Outcomes Short: Continue to check BP and weight at home Long: Conitnue to monitor risk factors.             ITP Comments:  ITP Comments     Row Name 03/21/21 1411 04/03/21 0930 04/11/21 0727 05/09/21 1220 05/21/21 0841   ITP Comments Initial telephone orientation completed. Diagnosis can be found in [facility] 9/17. EP orientation scheduled for Tuesday 3/14 at 8am. Completed 6MWT and gym orientation. Initial ITP created and sent for review to Dr. Emily Filbert, Medical Director. 30 Day review completed. Medical Director ITP review done, changes made as directed, and signed approval by Medical Director. 30 Day review completed. Medical Director ITP review done, changes made as directed, and signed approval by Medical Director. Eliga has not started cardiac rehab yet as he was out for a month. His start date is projected  on 5/9.    [place] Name 05/29/21 [id number] 06/06/21 0804 07/04/21 0711 08/01/21 0734 08/29/21 0635   ITP Comments First full day of exercise!  Patient was  oriented to gym and equipment including functions, settings, policies, and procedures.  Patient's individual exercise prescription and treatment plan were reviewed.  All starting workloads were established based on the results of the 6 minute walk test done at initial orientation visit.  The plan for exercise progression was also introduced and progression will be customized based on patient's performance and goals. 30 Day review completed. Medical Director ITP review done, changes made as directed, and signed approval by Medical Director.     New 30 Day review completed. Medical Director ITP review done, changes made as directed, and signed approval by Medical Director. 30 Day review completed. Medical Director ITP review done, changes made as directed, and signed approval by Medical Director. 30 Day review completed. Medical Director ITP review done, changes made as directed, and signed approval by Medical Director.            Comments:

## 2021-08-30 ENCOUNTER — Encounter: Payer: Medicare Other | Admitting: *Deleted

## 2021-08-30 DIAGNOSIS — I214 Non-ST elevation (NSTEMI) myocardial infarction: Secondary | ICD-10-CM

## 2021-08-30 DIAGNOSIS — Z955 Presence of coronary angioplasty implant and graft: Secondary | ICD-10-CM

## 2021-08-30 NOTE — Progress Notes (Signed)
Daily Session Note  Patient Details  Name: Shawn Andrews MRN: [id number] Date of Birth: [birth year] Referring Provider:   Flowsheet Row Cardiac Rehab from 04/03/2021 in [facility] Cardiac and Pulmonary Rehab  Referring Provider Serafina Royals MD       Encounter Date: 08/30/2021  Check In:  Session Check In - 08/30/21 1005       Check-In   Supervising physician immediately available to respond to emergencies See telemetry face sheet for immediately available ER MD    Location [facility]-Cardiac & Pulmonary Rehab    Staff Present Nyoka Cowden, RN, BSN, Ardeth Sportsman, RDN, Tawanna Solo, MS, ASCM CEP, Exercise Physiologist    Virtual Visit No    Medication changes reported     No    Fall or balance concerns reported    No    Tobacco Cessation No Change    Warm-up and Cool-down Performed on first and last piece of equipment    Resistance Training Performed Yes    VAD Patient? No      Pain Assessment   Currently in Pain? No/denies                Social History   Tobacco Use  Smoking Status Former   Packs/day: 0.00   Years: 0.00   Total pack years: 0.00   Types: Pipe, Cigarettes   Quit date: 12/26/2000   Years since quitting: 20.6  Smokeless Tobacco Current   Types: Chew  Tobacco Comments   quit over 30 years ago    Goals Met:  Independence with exercise equipment Exercise tolerated well No report of concerns or symptoms today  Goals Unmet:  Not Applicable  Comments: Pt able to follow exercise prescription today without complaint.  Will continue to monitor for progression.    Dr. Emily Filbert is Medical Director for [place].  Dr. Ottie Glazier is Medical Director for [facility] Pulmonary Rehabilitation.

## 2021-08-31 ENCOUNTER — Encounter: Payer: Medicare Other | Admitting: Family Medicine

## 2021-09-04 ENCOUNTER — Encounter: Payer: Medicare Other | Admitting: *Deleted

## 2021-09-04 DIAGNOSIS — I214 Non-ST elevation (NSTEMI) myocardial infarction: Secondary | ICD-10-CM

## 2021-09-04 DIAGNOSIS — Z955 Presence of coronary angioplasty implant and graft: Secondary | ICD-10-CM

## 2021-09-04 NOTE — Progress Notes (Signed)
Daily Session Note  Patient Details  Name: SAMEER TEEPLE MRN: [id number] Date of Birth: [birth year] Referring Provider:   Flowsheet Row Cardiac Rehab from 04/03/2021 in [facility] Cardiac and Pulmonary Rehab  Referring Provider Serafina Royals MD       Encounter Date: 09/04/2021  Check In:  Session Check In - 09/04/21 0939       Check-In   Supervising physician immediately available to respond to emergencies See telemetry face sheet for immediately available ER MD    Location [facility]-Cardiac & Pulmonary Rehab    Staff Present Heath Lark, RN, BSN, CCRP;Jessica [place], MA, RCEP, CCRP, [place], BS, ACSM CEP, Exercise Physiologist    Virtual Visit No    Medication changes reported     No    Fall or balance concerns reported    No    Warm-up and Cool-down Performed on first and last piece of equipment    Resistance Training Performed Yes    VAD Patient? No    PAD/SET Patient? No      Pain Assessment   Currently in Pain? No/denies                Social History   Tobacco Use  Smoking Status Former   Packs/day: 0.00   Years: 0.00   Total pack years: 0.00   Types: Pipe, Cigarettes   Quit date: 12/26/2000   Years since quitting: 20.7  Smokeless Tobacco Current   Types: Chew  Tobacco Comments   quit over 30 years ago    Goals Met:  Independence with exercise equipment Exercise tolerated well No report of concerns or symptoms today  Goals Unmet:  Not Applicable  Comments: Pt able to follow exercise prescription today without complaint.  Will continue to monitor for progression.    Dr. Emily Filbert is Medical Director for [place].  Dr. Ottie Glazier is Medical Director for [facility] Pulmonary Rehabilitation.

## 2021-09-06 ENCOUNTER — Encounter: Payer: Medicare Other | Admitting: *Deleted

## 2021-09-06 VITALS — Ht 70.5 in | Wt 184.2 lb

## 2021-09-06 DIAGNOSIS — I214 Non-ST elevation (NSTEMI) myocardial infarction: Secondary | ICD-10-CM | POA: Diagnosis not present

## 2021-09-06 DIAGNOSIS — Z955 Presence of coronary angioplasty implant and graft: Secondary | ICD-10-CM

## 2021-09-06 NOTE — Progress Notes (Signed)
Daily Session Note  Patient Details  Name: Shawn Andrews MRN: [id number] Date of Birth: [birth year] Referring Provider:   Flowsheet Row Cardiac Rehab from 04/03/2021 in [facility] Cardiac and Pulmonary Rehab  Referring Provider Serafina Royals MD       Encounter Date: 09/06/2021  Check In:  Session Check In - 09/06/21 0935       Check-In   Supervising physician immediately available to respond to emergencies See telemetry face sheet for immediately available ER MD    Location [facility]-Cardiac & Pulmonary Rehab    Staff Present Heath Lark, RN, BSN, CCRP;Melissa [place], RDN, LDN;Jessica [place], MA, RCEP, CCRP, CCET;Noah Tickle, BS, Exercise Physiologist    Virtual Visit No    Medication changes reported     No    Fall or balance concerns reported    No    Warm-up and Cool-down Performed on first and last piece of equipment    Resistance Training Performed Yes    VAD Patient? No    PAD/SET Patient? No      Pain Assessment   Currently in Pain? No/denies              6 Minute Walk     Row Name 04/03/21 0944 09/06/21 0939       6 Minute Walk   Phase Initial Discharge    Distance 1040 feet 990 feet    Distance % Change -- -5 %    Distance Feet Change -- -50 ft    Walk Time 6 minutes 6 minutes    # of Rest Breaks 0 0    MPH 1.96 1.88    METS 1.41 1.32    RPE 9 11    Perceived Dyspnea  0 0    VO2 Peak 4.93 4.63    Symptoms No No    Resting HR 65 bpm 55 bpm    Resting BP 102/56 116/62    Resting Oxygen Saturation  98 % 97 %    Exercise Oxygen Saturation  during 6 min walk 94 % 93 %    Max Ex. HR 84 bpm 84 bpm    Max Ex. BP 132/62 120/66    2 Minute Post BP 100/58 --               Social History   Tobacco Use  Smoking Status Former   Packs/day: 0.00   Years: 0.00   Total pack years: 0.00   Types: Pipe, Cigarettes   Quit date: 12/26/2000   Years since quitting: 20.7  Smokeless Tobacco Current   Types: Chew  Tobacco Comments   quit over 30 years ago     Goals Met:  Independence with exercise equipment Exercise tolerated well No report of concerns or symptoms today  Goals Unmet:  Not Applicable  Comments: Pt able to follow exercise prescription today without complaint.  Will continue to monitor for progression.    Dr. Emily Filbert is Medical Director for [place].  Dr. Ottie Glazier is Medical Director for [facility] Pulmonary Rehabilitation.

## 2021-09-06 NOTE — Patient Instructions (Signed)
Discharge Patient Instructions  Patient Details  Name: Shawn Andrews MRN: [id number] Date of Birth: Oct 28, 1928 Referring Provider:  Corey Skains, MD   Number of Visits: 36  Reason for Discharge:  Patient reached a stable level of exercise. Patient independent in their exercise. Patient has met program and personal goals.  Diagnosis:  NSTEMI (non-ST elevated myocardial infarction) [facility])  Status post coronary artery stent placement  Initial Exercise Prescription:  Initial Exercise Prescription - 04/03/21 1400       Date of Initial Exercise RX and Referring Provider   Date 04/03/21    Referring Provider Serafina Royals MD      Oxygen   Maintain Oxygen Saturation 88% or higher      Treadmill   MPH 1.8    Grade 0.5    Minutes 15    METs 2.5      NuStep   Level 1    SPM 80    Minutes 15    METs 1.4      REL-XR   Level 1    Speed 50    Minutes 15    METs 1.4      Prescription Details   Frequency (times per week) 2-3   TBD   Duration Progress to 30 minutes of continuous aerobic without signs/symptoms of physical distress      Intensity   THRR 40-80% of Max Heartrate 91-117    Ratings of Perceived Exertion 11-13    Perceived Dyspnea 0-4      Progression   Progression Continue to progress workloads to maintain intensity without signs/symptoms of physical distress.      Resistance Training   Training Prescription Yes    Weight 3 lb    Reps 10-15             Discharge Exercise Prescription (Final Exercise Prescription Changes):  Exercise Prescription Changes - 08/27/21 0800       Response to Exercise   Blood Pressure (Admit) 122/56    Blood Pressure (Exit) 114/60    Heart Rate (Admit) 54 bpm    Heart Rate (Exercise) 79 bpm    Heart Rate (Exit) 64 bpm    Rating of Perceived Exertion (Exercise) 14    Symptoms none    Duration Continue with 30 min of aerobic exercise without signs/symptoms of physical distress.    Intensity THRR unchanged       Progression   Progression Continue to progress workloads to maintain intensity without signs/symptoms of physical distress.    Average METs 2.32      Resistance Training   Training Prescription Yes    Weight 6 lb    Reps 10-15      Interval Training   Interval Training No      Recumbant Bike   Level 5    Minutes 15    METs 3.1      NuStep   Level 5    Minutes 15      T5 Nustep   Level 4      Biostep-RELP   Level 4    Minutes 15    METs 2      Track   Laps 30    Minutes 15    METs 2.63      Home Exercise Plan   Plans to continue exercise at [employer] (comment)   [employer] (treadmill, bike, weight machines)   Frequency Add 3 additional days to program exercise sessions.    Initial Home Exercises  Provided 06/05/21      Oxygen   Maintain Oxygen Saturation 88% or higher             Functional Capacity:  6 Minute Walk     Row Name 04/03/21 0944 09/06/21 0939       6 Minute Walk   Phase Initial Discharge    Distance 1040 feet 990 feet    Distance % Change -- -5 %    Distance Feet Change -- -50 ft    Walk Time 6 minutes 6 minutes    # of Rest Breaks 0 0    MPH 1.96 1.88    METS 1.41 1.32    RPE 9 11    Perceived Dyspnea  0 0    VO2 Peak 4.93 4.63    Symptoms No No    Resting HR 65 bpm 55 bpm    Resting BP 102/56 116/62    Resting Oxygen Saturation  98 % 97 %    Exercise Oxygen Saturation  during 6 min walk 94 % 93 %    Max Ex. HR 84 bpm 84 bpm    Max Ex. BP 132/62 120/66    2 Minute Post BP 100/58 --            Nutrition & Weight - Outcomes:  Pre Biometrics - 04/03/21 0943       Pre Biometrics   Height 5' 10.5" (1.791 m)    Weight 178 lb (80.7 kg)    BMI (Calculated) 25.17    Single Leg Stand 1.94 seconds             Post Biometrics - 09/06/21 0941        Post  Biometrics   Height 5' 10.5" (1.791 m)    Weight 184 lb 3.2 oz (83.6 kg)    BMI (Calculated) 26.05    Single Leg Stand 3.96 seconds             Nutrition:  Nutrition Therapy & Goals - 08/09/21 0933       Nutrition Therapy   RD appointment deferred Yes

## 2021-09-11 ENCOUNTER — Encounter: Payer: Medicare Other | Admitting: *Deleted

## 2021-09-11 DIAGNOSIS — I214 Non-ST elevation (NSTEMI) myocardial infarction: Secondary | ICD-10-CM | POA: Diagnosis not present

## 2021-09-11 DIAGNOSIS — Z955 Presence of coronary angioplasty implant and graft: Secondary | ICD-10-CM

## 2021-09-11 NOTE — Progress Notes (Signed)
Daily Session Note  Patient Details  Name: TELLER WAKEFIELD MRN: [id number] Date of Birth: 1928-04-12 Referring Provider:   Flowsheet Row Cardiac Rehab from 04/03/2021 in [facility] Cardiac and Pulmonary Rehab  Referring Provider Serafina Royals MD       Encounter Date: 09/11/2021  Check In:  Session Check In - 09/11/21 1008       Check-In   Supervising physician immediately available to respond to emergencies See telemetry face sheet for immediately available ER MD    Location [facility]-Cardiac & Pulmonary Rehab    Staff Present Heath Lark, RN, BSN, CCRP;Jessica Sta[place], MA, RCEP, CCRP, [place], BS, ACSM CEP, Exercise Physiologist    Virtual Visit No    Medication changes reported     No    Fall or balance concerns reported    No    Warm-up and Cool-down Performed on first and last piece of equipment    Resistance Training Performed Yes    VAD Patient? No    PAD/SET Patient? No      Pain Assessment   Currently in Pain? No/denies                Social History   Tobacco Use  Smoking Status Former   Packs/day: 0.00   Years: 0.00   Total pack years: 0.00   Types: Pipe, Cigarettes   Quit date: 12/26/2000   Years since quitting: 20.7  Smokeless Tobacco Current   Types: Chew  Tobacco Comments   quit over 30 years ago    Goals Met:  Independence with exercise equipment Exercise tolerated well No report of concerns or symptoms today  Goals Unmet:  Not Applicable  Comments: Pt able to follow exercise prescription today without complaint.  Will continue to monitor for progression.    Dr. Emily Filbert is Medical Director for [place].  Dr. Ottie Glazier is Medical Director for [facility] Pulmonary Rehabilitation.

## 2021-09-13 ENCOUNTER — Encounter: Payer: Medicare Other | Admitting: *Deleted

## 2021-09-13 DIAGNOSIS — I214 Non-ST elevation (NSTEMI) myocardial infarction: Secondary | ICD-10-CM

## 2021-09-13 DIAGNOSIS — Z955 Presence of coronary angioplasty implant and graft: Secondary | ICD-10-CM

## 2021-09-13 NOTE — Progress Notes (Signed)
Cardiac Individual Treatment Plan  Patient Details  Name: Shawn Andrews MRN: [id number] Date of Birth: 1928-12-08 Referring Provider:   Flowsheet Row Cardiac Rehab from 04/03/2021 in [facility] Cardiac and Pulmonary Rehab  Referring Provider Serafina Royals MD       Initial Encounter Date:  Flowsheet Row Cardiac Rehab from 04/03/2021 in [facility] Cardiac and Pulmonary Rehab  Date 04/03/21       Visit Diagnosis: NSTEMI (non-ST elevated myocardial infarction) [facility])  Status post coronary artery stent placement  Patient's Home Medications on Admission:  Current Outpatient Medications:    acetaminophen (TYLENOL 8 HOUR) 650 MG CR tablet, Take 1 tablet (650 mg total) by mouth 2 (two) times a day., Disp: , Rfl:    Cholecalciferol (VITAMIN D3) 25 MCG (1000 UT) CAPS, Take 1 capsule (1,000 Units total) by mouth daily., Disp: 30 capsule, Rfl:    clopidogrel (PLAVIX) 75 MG tablet, Take 1 tablet by mouth daily., Disp: , Rfl:    Coenzyme Q10 (CO Q-10) 100 MG CAPS, Take 1 capsule by mouth daily. (Patient not taking: Reported on 02/25/2021), Disp: , Rfl: 0   cyanocobalamin 1000 MCG tablet, Take 1,000 mcg by mouth daily., Disp: , Rfl:    docusate sodium (COLACE) 100 MG capsule, Take 1 capsule (100 mg total) by mouth daily., Disp: , Rfl:    doxazosin (CARDURA) 4 MG tablet, TAKE 1 TABLET BY MOUTH  DAILY, Disp: 90 tablet, Rfl: 3   famotidine (PEPCID) 20 MG tablet, TAKE 1 TABLET BY MOUTH AT  BEDTIME, Disp: 90 tablet, Rfl: 3   finasteride (PROSCAR) 5 MG tablet, TAKE 1 TABLET BY MOUTH  DAILY, Disp: 90 tablet, Rfl: 3   furosemide (LASIX) 20 MG tablet, Take 1 tablet by mouth every other day., Disp: , Rfl:    isosorbide mononitrate (IMDUR) 30 MG 24 hr tablet, Take 1 tablet by mouth daily., Disp: , Rfl:    loratadine (CLARITIN) 10 MG tablet, Take 1 tablet (10 mg total) by mouth daily as needed for allergies., Disp: 30 tablet, Rfl:    metoprolol tartrate (LOPRESSOR) 50 MG tablet, Take 0.5 tablets (25 mg total) by mouth 2  (two) times daily., Disp: , Rfl:    nitroGLYCERIN (NITROSTAT) 0.4 MG SL tablet, Place under the tongue., Disp: , Rfl:    polyethylene glycol powder (GLYCOLAX/MIRALAX) 17 GM/SCOOP powder, Take 8.5-17 g by mouth daily as needed for moderate constipation., Disp: 3350 g, Rfl: 0   primidone (MYSOLINE) 50 MG tablet, Take 2 tablets (100 mg total) by mouth 3 (three) times daily., Disp: , Rfl:    rosuvastatin (CRESTOR) 5 MG tablet, Take 10 mg by mouth daily., Disp: , Rfl:    vitamin B-12 (CYANOCOBALAMIN) 500 MCG tablet, Take 1 tablet (500 mcg total) by mouth once a week., Disp: , Rfl:   Past Medical History: Past Medical History:  Diagnosis Date   Abnormal drug screen 06/22/2014   inapprop neg xanax rpt 3 mo (06/2014)   BPH (benign prostatic hypertrophy) 01/21/1998   has had 3 biopsies in past ([facility]) decided to stop PSA/DRE   CHF (congestive heart failure) (HCC)    Coronary artery disease    Hyperlipidemia 01/21/2002   Hypertension 05/22/2003   Left lumbar radiculopathy    Osteoarthritis 01/22/1988   knees, lumbar spondylosis and listhesis    Tobacco Use: Social History   Tobacco Use  Smoking Status Former   Packs/day: 0.00   Years: 0.00   Total pack years: 0.00   Types: Pipe, Cigarettes   Quit date:  12/26/2000   Years since quitting: 20.7  Smokeless Tobacco Current   Types: Chew  Tobacco Comments   quit over 30 years ago    Labs: Review Flowsheet  More data exists      Latest Ref Rng & Units 08/18/2018 08/25/2019 08/25/2020 01/17/2021 01/18/2021  Labs for ITP Cardiac and Pulmonary Rehab  Cholestrol 0 - 200 mg/dL 190  202  212  - 157   LDL (calc) 0 - 99 mg/dL 129  134  138  - 99   HDL-C >40 mg/dL 43.50  45.80  49.80  - 45   Trlycerides <150 mg/dL 88.0  110.0  119.0  - 66   Hemoglobin A1c 4.8 - 5.6 % - - - 5.0  -     Exercise Target Goals: Exercise Program Goal: Individual exercise prescription set using results from initial 6 min walk test and THRR while considering   patient's activity barriers and safety.   Exercise Prescription Goal: Initial exercise prescription builds to 30-45 minutes a day of aerobic activity, 2-3 days per week.  Home exercise guidelines will be given to patient during program as part of exercise prescription that the participant will acknowledge.   Education: Aerobic Exercise: - Group verbal and visual presentation on the components of exercise prescription. Introduces F.I.T.T principle from ACSM for exercise prescriptions.  Reviews F.I.T.T. principles of aerobic exercise including progression. Written material given at graduation. Flowsheet Row Cardiac Rehab from 09/13/2021 in [facility] Cardiac and Pulmonary Rehab  Education need identified 04/03/21       Education: Resistance Exercise: - Group verbal and visual presentation on the components of exercise prescription. Introduces F.I.T.T principle from ACSM for exercise prescriptions  Reviews F.I.T.T. principles of resistance exercise including progression. Written material given at graduation. Flowsheet Row Cardiac Rehab from 09/13/2021 in [facility] Cardiac and Pulmonary Rehab  Date 08/09/21  Educator [facility]  Instruction Review Code 1- Verbalizes Understanding        Education: Exercise & Equipment Safety: - Individual verbal instruction and demonstration of equipment use and safety with use of the equipment. Flowsheet Row Cardiac Rehab from 09/13/2021 in [facility] Cardiac and Pulmonary Rehab  Education need identified 04/03/21  Date 04/03/21  Educator [place]  Instruction Review Code 1- Verbalizes Understanding       Education: Exercise Physiology & General Exercise Guidelines: - Group verbal and written instruction with models to review the exercise physiology of the cardiovascular system and associated critical values. Provides general exercise guidelines with specific guidelines to those with heart or lung disease.  Flowsheet Row Cardiac Rehab from 09/13/2021 in [facility] Cardiac and Pulmonary  Rehab  Education need identified 04/03/21       Education: Flexibility, Balance, Mind/Body Relaxation: - Group verbal and visual presentation with interactive activity on the components of exercise prescription. Introduces F.I.T.T principle from ACSM for exercise prescriptions. Reviews F.I.T.T. principles of flexibility and balance exercise training including progression. Also discusses the mind body connection.  Reviews various relaxation techniques to help reduce and manage stress (i.e. Deep breathing, progressive muscle relaxation, and visualization). Balance handout provided to take home. Written material given at graduation. Flowsheet Row Cardiac Rehab from 09/13/2021 in [facility] Cardiac and Pulmonary Rehab  Date 08/09/21  Educator [facility]  Instruction Review Code 1- Verbalizes Understanding       Activity Barriers & Risk Stratification:  Activity Barriers & Cardiac Risk Stratification - 04/03/21 0943       Activity Barriers & Cardiac Risk Stratification   Activity Barriers Deconditioning;Decreased Ventricular Function  Comments Suprapubic catheter- reports no limitation or problems with movement or activity    Cardiac Risk Stratification High             6 Minute Walk:  6 Minute Walk     Row Name 04/03/21 0944 09/06/21 0939       6 Minute Walk   Phase Initial Discharge    Distance 1040 feet 990 feet    Distance % Change -- -5 %    Distance Feet Change -- -50 ft    Walk Time 6 minutes 6 minutes    # of Rest Breaks 0 0    MPH 1.96 1.88    METS 1.41 1.32    RPE 9 11    Perceived Dyspnea  0 0    VO2 Peak 4.93 4.63    Symptoms No No    Resting HR 65 bpm 55 bpm    Resting BP 102/56 116/62    Resting Oxygen Saturation  98 % 97 %    Exercise Oxygen Saturation  during 6 min walk 94 % 93 %    Max Ex. HR 84 bpm 84 bpm    Max Ex. BP 132/62 120/66    2 Minute Post BP 100/58 --             Oxygen Initial Assessment:   Oxygen Re-Evaluation:   Oxygen Discharge  (Final Oxygen Re-Evaluation):   Initial Exercise Prescription:  Initial Exercise Prescription - 04/03/21 1400       Date of Initial Exercise RX and Referring Provider   Date 04/03/21    Referring Provider Serafina Royals MD      Oxygen   Maintain Oxygen Saturation 88% or higher      Treadmill   MPH 1.8    Grade 0.5    Minutes 15    METs 2.5      NuStep   Level 1    SPM 80    Minutes 15    METs 1.4      REL-XR   Level 1    Speed 50    Minutes 15    METs 1.4      Prescription Details   Frequency (times per week) 2-3   TBD   Duration Progress to 30 minutes of continuous aerobic without signs/symptoms of physical distress      Intensity   THRR 40-80% of Max Heartrate 91-117    Ratings of Perceived Exertion 11-13    Perceived Dyspnea 0-4      Progression   Progression Continue to progress workloads to maintain intensity without signs/symptoms of physical distress.      Resistance Training   Training Prescription Yes    Weight 3 lb    Reps 10-15             Perform Capillary Blood Glucose checks as needed.  Exercise Prescription Changes:   Exercise Prescription Changes     Row Name 04/03/21 1400 06/04/21 0800 06/05/21 0900 06/19/21 1600 07/02/21 1500     Response to Exercise   Blood Pressure (Admit) 102/56 112/58 -- 108/62 122/58   Blood Pressure (Exercise) 132/62 122/76 -- 124/60 --   Blood Pressure (Exit) 100/58 124/60 -- 126/64 112/60   Heart Rate (Admit) 65 bpm 75 bpm -- 63 bpm 71 bpm   Heart Rate (Exercise) 84 bpm 77 bpm -- 97 bpm 87 bpm   Heart Rate (Exit) 66 bpm 65 bpm -- 68 bpm 69 bpm   Oxygen Saturation (Admit)  98 % -- -- -- --   Oxygen Saturation (Exercise) 94 % -- -- -- --   Oxygen Saturation (Exit) 96 % -- -- -- --   Rating of Perceived Exertion (Exercise) 9 13 -- 13 12   Perceived Dyspnea (Exercise) 0 -- -- -- --   Symptoms none none -- none none   Comments walk test results second full day of exercise -- -- --   Duration -- Progress  to 30 minutes of  aerobic without signs/symptoms of physical distress -- Continue with 30 min of aerobic exercise without signs/symptoms of physical distress. Continue with 30 min of aerobic exercise without signs/symptoms of physical distress.   Intensity -- THRR unchanged -- THRR unchanged THRR unchanged     Progression   Progression -- Continue to progress workloads to maintain intensity without signs/symptoms of physical distress. -- Continue to progress workloads to maintain intensity without signs/symptoms of physical distress. Continue to progress workloads to maintain intensity without signs/symptoms of physical distress.   Average METs -- 2.29 -- 2.86 3.26     Resistance Training   Training Prescription -- Yes -- Yes Yes   Weight -- 3 lb -- 3 lb 6 lb   Reps -- 10-15 -- 10-15 10-15     Interval Training   Interval Training -- No -- No No     Treadmill   MPH -- 1.9 -- 2.1 2.1   Grade -- 0.5 -- 0.5 0.5   Minutes -- 15 -- 15 15   METs -- 259 -- 2.75 2.75     Recumbant Bike   Level -- -- -- 1 3   Watts -- -- -- -- 27   Minutes -- -- -- 15 15   METs -- -- -- 1.9 2.98     Arm Ergometer   Level -- 1 -- -- --   Minutes -- 15 -- -- --     REL-XR   Level -- 3 -- -- 1   Minutes -- 15 -- -- 15   METs -- -- -- -- 5.2     T5 Nustep   Level -- -- -- 3 3   Minutes -- -- -- 15 15   METs -- -- -- 2.4 2.4     Biostep-RELP   Level -- -- -- 2 2   Minutes -- -- -- 15 15   METs -- -- -- 3 3     Track   Laps -- -- -- 18 --   Minutes -- 15 -- 15 --   METs -- -- -- 1.98 --     Home Exercise Plan   Plans to continue exercise at -- -- [employer] (comment)  [employer] (treadmill, bike, [employer] machines) Forensic scientist (comment)  [employer] (treadmill, bike, [employer]) Forensic scientist (comment)  [employer] (treadmill, bike, [employer])   Frequency -- -- Add 3 additional days to program exercise sessions. Add 3 additional days to program exercise sessions. Add 3 additional days  to program exercise sessions.   Initial Home Exercises Provided -- -- 06/05/21 06/05/21 06/05/21     Oxygen   Maintain Oxygen Saturation -- 88% or higher 88% or higher 88% or higher 88% or higher    Row Name 07/16/21 0700 07/30/21 0800 08/13/21 0900 08/27/21 0800 09/10/21 0700     Response to Exercise   Blood Pressure (Admit) 102/60 112/60 104/56 122/56 116/62   Blood Pressure (Exit) 100/56 124/62 112/60 114/60 100/58   Heart Rate (Admit) 59 bpm 58  bpm 57 bpm 54 bpm 55 bpm   Heart Rate (Exercise) 95 bpm 84 bpm 73 bpm 79 bpm 84 bpm   Heart Rate (Exit) 62 bpm 63 bpm 61 bpm 64 bpm 65 bpm   Rating of Perceived Exertion (Exercise) 13 12 13 14 13    Symptoms none none none none none   Duration Continue with 30 min of aerobic exercise without signs/symptoms of physical distress. Continue with 30 min of aerobic exercise without signs/symptoms of physical distress. Continue with 30 min of aerobic exercise without signs/symptoms of physical distress. Continue with 30 min of aerobic exercise without signs/symptoms of physical distress. Continue with 30 min of aerobic exercise without signs/symptoms of physical distress.   Intensity THRR unchanged THRR New  85-115 THRR unchanged THRR unchanged THRR unchanged     Progression   Progression Continue to progress workloads to maintain intensity without signs/symptoms of physical distress. Continue to progress workloads to maintain intensity without signs/symptoms of physical distress. Continue to progress workloads to maintain intensity without signs/symptoms of physical distress. Continue to progress workloads to maintain intensity without signs/symptoms of physical distress. Continue to progress workloads to maintain intensity without signs/symptoms of physical distress.   Average METs 2.62 2.67 2.41 2.32 2.72     Resistance Training   Training Prescription Yes Yes Yes Yes Yes   Weight 6 lb 6 lb 6 lb 6 lb 6 lb   Reps 10-15 10-15 10-15 10-15 10-15      Interval Training   Interval Training No No No No No     Recumbant Bike   Level 4 4 5 5 4    Watts 26 25 29  -- 23   Minutes 15 15 15 15 15    METs 2.96 2.96 -- 3.1 3.09     NuStep   Level 1 2 4 5 3    Minutes 15 15 15 15 15    METs 2.6 -- 3 -- 3.3     T5 Nustep   Level 4 5 4 4 4    Minutes 15 15 15  -- 15   METs 2.1 2.1 2 -- 2.1     Biostep-RELP   Level 3 3 3 4 4    Minutes 15 15 15 15 15    METs 3 3 3 2  --     Track   Laps 30 30 30 30  --   Minutes 15 15 15 15  --   METs 2.63 2.63 2.63 2.63 --     Home Exercise Plan   Plans to continue exercise at [employer] (comment)  [employer] (treadmill, bike, weight machines) Forensic scientist (comment)  [employer] (treadmill, bike, [employer]) Forensic scientist (comment)  [employer] (treadmill, bike, [employer]) Forensic scientist (comment)  [employer] (treadmill, bike, [employer]) Forensic scientist (comment)  [employer] (treadmill, bike, [employer])   Frequency Add 3 additional days to program exercise sessions. Add 3 additional days to program exercise sessions. Add 3 additional days to program exercise sessions. Add 3 additional days to program exercise sessions. Add 3 additional days to program exercise sessions.   Initial Home Exercises Provided 06/05/21 06/05/21 06/05/21 06/05/21 06/05/21     Oxygen   Maintain Oxygen Saturation 88% or higher 88% or higher 88% or higher 88% or higher 88% or higher            Exercise Comments:   Exercise Comments     Row Name 05/29/21 0934 09/13/21 1007         Exercise Comments First full day of exercise!  Patient was oriented to gym and equipment including functions, settings, policies, and procedures.  Patient's individual exercise prescription and treatment plan were reviewed.  All starting workloads were established based on the results of the 6 minute walk test done at initial orientation visit.  The plan for exercise progression was also introduced and progression will be customized based  on patient's performance and goals. Shawn Andrews graduated today from  rehab with 36 sessions completed.  Details of the patient's exercise prescription and what He needs to do in order to continue the prescription and progress were discussed with patient.  Patient was given a copy of prescription and goals.  Patient verbalized understanding.  Shawn Andrews plans to continue to exercise by exercising at [facility] .               Exercise Goals and Review:   Exercise Goals     Row Name 04/03/21 0945             Exercise Goals   Increase Physical Activity Yes       Intervention Provide advice, education, support and counseling about physical activity/exercise needs.;Develop an individualized exercise prescription for aerobic and resistive training based on initial evaluation findings, risk stratification, comorbidities and participant's personal goals.       Expected Outcomes Short Term: Attend rehab on a regular basis to increase amount of physical activity.;Long Term: Add in home exercise to make exercise part of routine and to increase amount of physical activity.;Long Term: Exercising regularly at least 3-5 days a week.       Increase Strength and Stamina Yes       Intervention Provide advice, education, support and counseling about physical activity/exercise needs.;Develop an individualized exercise prescription for aerobic and resistive training based on initial evaluation findings, risk stratification, comorbidities and participant's personal goals.       Expected Outcomes Short Term: Increase workloads from initial exercise prescription for resistance, speed, and METs.;Short Term: Perform resistance training exercises routinely during rehab and add in resistance training at home;Long Term: Improve cardiorespiratory fitness, muscular endurance and strength as measured by increased METs and functional capacity (6MWT)       Able to understand and use rate of perceived exertion (RPE) scale Yes        Intervention Provide education and explanation on how to use RPE scale       Expected Outcomes Short Term: Able to use RPE daily in rehab to express subjective intensity level;Long Term:  Able to use RPE to guide intensity level when exercising independently       Able to understand and use Dyspnea scale Yes       Intervention Provide education and explanation on how to use Dyspnea scale       Expected Outcomes Short Term: Able to use Dyspnea scale daily in rehab to express subjective sense of shortness of breath during exertion;Long Term: Able to use Dyspnea scale to guide intensity level when exercising independently       Knowledge and understanding of Target Heart Rate Range (THRR) Yes       Intervention Provide education and explanation of THRR including how the numbers were predicted and where they are located for reference       Expected Outcomes Short Term: Able to use daily as guideline for intensity in rehab;Short Term: Able to state/look up THRR;Long Term: Able to use THRR to govern intensity when exercising independently       Able to check pulse independently Yes  Intervention Provide education and demonstration on how to check pulse in carotid and radial arteries.;Review the importance of being able to check your own pulse for safety during independent exercise       Expected Outcomes Short Term: Able to explain why pulse checking is important during independent exercise;Long Term: Able to check pulse independently and accurately       Understanding of Exercise Prescription Yes       Intervention Provide education, explanation, and written materials on patient's individual exercise prescription       Expected Outcomes Short Term: Able to explain program exercise prescription;Long Term: Able to explain home exercise prescription to exercise independently                Exercise Goals Re-Evaluation :  Exercise Goals Re-Evaluation     Row Name 05/29/21 0934 06/04/21 0849  06/05/21 0949 06/19/21 0947 06/19/21 1631     Exercise Goal Re-Evaluation   Exercise Goals Review Increase Physical Activity;Able to understand and use rate of perceived exertion (RPE) scale;Understanding of Exercise Prescription;Knowledge and understanding of Target Heart Rate Range (THRR);Increase Strength and Stamina;Able to understand and use Dyspnea scale;Able to check pulse independently Increase Physical Activity;Increase Strength and Stamina;Understanding of Exercise Prescription Increase Physical Activity;Increase Strength and Stamina;Understanding of Exercise Prescription Increase Physical Activity;Increase Strength and Stamina;Understanding of Exercise Prescription Increase Physical Activity;Increase Strength and Stamina;Understanding of Exercise Prescription   Comments Reviewed RPE and dyspnea scales, THR and program prescription with pt today.  Pt voiced understanding and was given a copy of goals to take home. Shawn Andrews is off to a good start in rehab.  He has completed his first two full days of rehab.  We will continue to montior his progress. Reviewed home exercise with pt today.  Pt plans to workout at the [facility] for exercise. He is already exercising on the bike, treadmill, and doing weight machines. Reviewed THR, pulse, RPE, sign and symptoms, pulse oximetery and when to call 911 or MD.  Also discussed weather considerations and indoor options.  Pt voiced understanding. Shawn Andrews is doing well in rehab.  He feels like his strength is starting to come back. He has been going to the [facility] for exercise on his off days.  He is also staying active at home with chores his wifes has for him. He is able to do them now without running out of stamina. Shawn Andrews is continuing to do well in rehab. He has increased his TM speed to 2.1 mph and is already at level 3 on the T5 Nustep. All RPEs are in appropriate range. He would benefit from increasing his level on the recumbant bike. Will continue to monitor.   Expected  Outcomes Short: Use RPE daily to regulate intensity. Long: Follow program prescription in THR. Short: Count laps on track and attend regularly  Long: Continue to follow program prescription Short: continue to monitor HR using THR Long: Continue to exercise at home independently Short; Continue to go to [facility] on off days Long: Continue to improve stamina Short: Increase level on RB Long: Continue to increase overall MET level    Row Name 07/02/21 1505 07/16/21 0735 07/17/21 0928 07/30/21 0833 08/09/21 0938     Exercise Goal Re-Evaluation   Exercise Goals Review Increase Physical Activity;Increase Strength and Stamina;Understanding of Exercise Prescription Increase Physical Activity;Increase Strength and Stamina;Understanding of Exercise Prescription Increase Physical Activity;Increase Strength and Stamina;Understanding of Exercise Prescription Increase Physical Activity;Increase Strength and Stamina;Understanding of Exercise Prescription Increase Physical Activity;Increase Strength and Stamina;Understanding of  Exercise Prescription   Comments Shawn Andrews is doing well in rehab.  He is up to 27 watts on the bike and using 6 lb hand weights.  We will continue to monitor his progress. Shawn Andrews is doing well in rehab. He recently improved to level 4 on the T5 machine and level 3 on the Biostep. He increased to 30 laps on the track also. He has tolerated using 6 lb hand weights for resistance training as well. We will continue to monitor his progress in the program. Shawn Andrews is doing well in rehab.  He is going to the [facility] on his off days for 30 min on the bike and treadmill.  He is starting to feel stronger and has some more stamina. He is about half way through the program already. Shawn Andrews continues to do well in rehab.  He is up to using 6 lb handweights.  He likes to break up his time on the track and will do two rounds of 15 laps  (15 min) with the NuStep in the middle so as not to aggravate his hip as much.  We will continue to  monitor his progress. Shawn Andrews reports working in the yard, sweeping the house. He goes to the [facility] for structured exercise (bike and treadmill - 1x/week for 30 minutes per session). Encouraged him to exercise at least 2x/week when not at rehab. He takes breaks while exercising here and at home when ealking due to sciatic pain.   Expected Outcomes Short: Back up to level 4 on T5 Long; Conitnue to improve stamina Short: increase level on T4 Long: Continue to improve MET levels Short:Conitnue to go to [facility] on off days Long; Conintue to improve stamina Short: Continue to move up seated equipment Long: Continue to improve stamina on the track Short:Conitnue to go to [facility] on off days Long; Conintue to improve stamina    Row Name 08/13/21 0903 08/27/21 0834 09/06/21 0941 09/10/21 0803       Exercise Goal Re-Evaluation   Exercise Goals Review Increase Physical Activity;Increase Strength and Stamina;Understanding of Exercise Prescription Increase Physical Activity;Increase Strength and Stamina;Understanding of Exercise Prescription Increase Physical Activity;Increase Strength and Stamina;Understanding of Exercise Prescription Increase Physical Activity;Increase Strength and Stamina;Understanding of Exercise Prescription    Comments Shawn Andrews continues to do well. He has increased his levels on both the recumbent bike to 5 and T4 Nustep to 4. He worked up to 29 watts on the bike which has been his highest so far. His THR zone was re-calculated and we hope to see him reach that during his sessions. Will continue to monitor. Shawn Andrews continues to do well in rehab. He has improved to level 4 on the biostep. He has also improved to level 5 on the T4. Shawn Andrews has consistently been able to reach 30 laps on the track as well. He has tolerated 6 lb hand weights for resistance training as well. We will continue to monitor his progress in the program. Shawn Andrews reports that he continues to stay active around the house and he is working in the  yard, sweeping the house. He goes to the [facility] for structured exercise (bike and treadmill - 1x/week for 30 minutes per session). He will be going 3x/week once he finishes rehab. He takes breaks while exercising here and at home when ealking due to sciatic pain. Shawn Andrews is doing well in rehab and is almost ready to graduate. He recently completed his post 6MWT. He also increased his overall average MET level to 2.72 METs. He  has tolerated using 6 lb hand weights for resistance training as well. We will continue to monitor his progress in the program until he graduates.    Expected Outcomes Short: Try to hit new THR Long: Continue to increase overall MET level Short: Continue to push for mor laps on track. Long: Continue to increase strength and stamina. Short: continue exercise after graduating. Long: Continue to increase strength and stamina. Short: Graduate. Long: Continue to exercise independently after graduation.             Discharge Exercise Prescription (Final Exercise Prescription Changes):  Exercise Prescription Changes - 09/10/21 0700       Response to Exercise   Blood Pressure (Admit) 116/62    Blood Pressure (Exit) 100/58    Heart Rate (Admit) 55 bpm    Heart Rate (Exercise) 84 bpm    Heart Rate (Exit) 65 bpm    Rating of Perceived Exertion (Exercise) 13    Symptoms none    Duration Continue with 30 min of aerobic exercise without signs/symptoms of physical distress.    Intensity THRR unchanged      Progression   Progression Continue to progress workloads to maintain intensity without signs/symptoms of physical distress.    Average METs 2.72      Resistance Training   Training Prescription Yes    Weight 6 lb    Reps 10-15      Interval Training   Interval Training No      Recumbant Bike   Level 4    Watts 23    Minutes 15    METs 3.09      NuStep   Level 3    Minutes 15    METs 3.3      T5 Nustep   Level 4    Minutes 15    METs 2.1      Biostep-RELP    Level 4    Minutes 15      Home Exercise Plan   Plans to continue exercise at [employer] (comment)   [facility] (treadmill, bike, weight machines)   Frequency Add 3 additional days to program exercise sessions.    Initial Home Exercises Provided 06/05/21      Oxygen   Maintain Oxygen Saturation 88% or higher             Nutrition:  Target Goals: Understanding of nutrition guidelines, daily intake of sodium '1500mg'$ , cholesterol '200mg'$ , calories 30% from fat and 7% or less from saturated fats, daily to have 5 or more servings of fruits and vegetables.  Education: All About Nutrition: -Group instruction provided by verbal, written material, interactive activities, discussions, models, and posters to present general guidelines for heart healthy nutrition including fat, fiber, MyPlate, the role of sodium in heart healthy nutrition, utilization of the nutrition label, and utilization of this knowledge for meal planning. Follow up email sent as well. Written material given at graduation. Flowsheet Row Cardiac Rehab from 09/13/2021 in [facility] Cardiac and Pulmonary Rehab  Education need identified 04/03/21       Biometrics:  Pre Biometrics - 04/03/21 0943       Pre Biometrics   Height 5' 10.5" (1.791 m)    Weight 178 lb (80.7 kg)    BMI (Calculated) 25.17    Single Leg Stand 1.94 seconds             Post Biometrics - 09/06/21 0941        Post  Biometrics   Height 5'  10.5" (1.791 m)    Weight 184 lb 3.2 oz (83.6 kg)    BMI (Calculated) 26.05    Single Leg Stand 3.96 seconds             Nutrition Therapy Plan and Nutrition Goals:  Nutrition Therapy & Goals - 08/09/21 0933       Nutrition Therapy   RD appointment deferred Yes             Nutrition Assessments:  MEDIFICTS Score Key: ?70 Need to make dietary changes  40-70 Heart Healthy Diet ? 40 Therapeutic Level Cholesterol Diet  Flowsheet Row Cardiac Rehab from 09/11/2021 in [facility] Cardiac and Pulmonary  Rehab  Picture Your Plate Total Score on Admission 86  Picture Your Plate Total Score on Discharge 75      Picture Your Plate Scores: <97 Unhealthy dietary pattern with much room for improvement. 41-50 Dietary pattern unlikely to meet recommendations for good health and room for improvement. 51-60 More healthful dietary pattern, with some room for improvement.  >60 Healthy dietary pattern, although there may be some specific behaviors that could be improved.    Nutrition Goals Re-Evaluation:  Nutrition Goals Re-Evaluation     [place] Name 06/19/21 [phone] 07/17/21 0931 08/09/21 0933 09/06/21 0940       Goals   Nutrition Goal Pt would not like to meet with RD as he feels his wife has him on a good diet. Will continue to follow up. Pt would not like to meet with RD as he feels his wife has him on a good diet. Will continue to follow up. Pt would not like to meet with RD as he feels his wife has him on a good diet. He reports no issues at this time. Will continue to follow up. Pt would not like to meet with RD as he feels his wife has him on a good diet. He reports no issues at this time and feels he has the tools he needs to continue after graduating.    Comment Shawn Andrews is doing well in rehab.  He feels that he and his wife are following a good diet.  He says that she keeps him on a strict healthy diet to keep him good.  They stay away from salt and she has broken his habit of using salt.  He has backed off sugary sweets.  At one point, he was over 200 lb, but they have worked on it.  They eat a lot of chicken and vegetables.  They are enjoying the fruits coming into season now. Shawn Andrews is doing well in rehab. He and his wife do well at watching his diet.  She keeps him eating healthy. -- --    Expected Outcome Short: Continue to get a good variety Long; Continue to focus on heart healthy eating. Short: Continue to get a good variety Long; Continue to focus on heart healthy eating. -- --              Nutrition Goals Discharge (Final Nutrition Goals Re-Evaluation):  Nutrition Goals Re-Evaluation - 09/06/21 0940       Goals   Nutrition Goal Pt would not like to meet with RD as he feels his wife has him on a good diet. He reports no issues at this time and feels he has the tools he needs to continue after graduating.             Psychosocial: Target Goals: Acknowledge presence or absence of significant depression and/or stress,  maximize coping skills, provide positive support system. Participant is able to verbalize types and ability to use techniques and skills needed for reducing stress and depression.   Education: Stress, Anxiety, and Depression - Group verbal and visual presentation to define topics covered.  Reviews how body is impacted by stress, anxiety, and depression.  Also discusses healthy ways to reduce stress and to treat/manage anxiety and depression.  Written material given at graduation. Flowsheet Row Cardiac Rehab from 09/13/2021 in [facility] Cardiac and Pulmonary Rehab  Date 09/13/21  Educator [facility]  Instruction Review Code 1- [employer] Understanding       Education: Sleep Hygiene -Provides group verbal and written instruction about how sleep can affect your health.  Define sleep hygiene, discuss sleep cycles and impact of sleep habits. Review good sleep hygiene tips.    Initial Review & Psychosocial Screening:  Initial Psych Review & Screening - 03/21/21 1412       Initial Review   Current issues with None Identified      Family Dynamics   Good Support System? Yes   wife     Barriers   Psychosocial barriers to participate in program There are no identifiable barriers or psychosocial needs.      Screening Interventions   Interventions Encouraged to exercise;To provide support and resources with identified psychosocial needs    Expected Outcomes Long Term Goal: Stressors or current issues are controlled or eliminated.;Short Term goal: Utilizing  psychosocial counselor, staff and physician to assist with identification of specific Stressors or current issues interfering with healing process. Setting desired goal for each stressor or current issue identified.;Short Term goal: Identification and review with participant of any Quality of Life or Depression concerns found by scoring the questionnaire.;Long Term goal: The participant improves quality of Life and PHQ9 Scores as seen by post scores and/or verbalization of changes             Quality of Life Scores:   Quality of Life - 09/11/21 1009       Quality of Life Scores   Health/Function Pre 26.57 %    Health/Function Post 24.8 %    Health/Function % Change -6.66 %    Socioeconomic Pre 25.63 %    Socioeconomic Post 27 %    Socioeconomic % Change  5.35 %    Psych/Spiritual Pre 29.64 %    Psych/Spiritual Post 28.29 %    Psych/Spiritual % Change -4.55 %    Family Pre 28.8 %    Family Post 28.8 %    Family % Change 0 %    GLOBAL Pre 27.29 %    GLOBAL Post 26.55 %    GLOBAL % Change -2.71 %            Scores of 19 and below usually indicate a poorer quality of life in these areas.  A difference of  2-3 points is a clinically meaningful difference.  A difference of 2-3 points in the total score of the Quality of Life Index has been associated with significant improvement in overall quality of life, self-image, physical symptoms, and general health in studies assessing change in quality of life.  PHQ-9: Review Flowsheet  More data exists      09/11/2021 04/03/2021 03/16/2021 02/01/2021 08/30/2020  Depression screen PHQ 2/9  Decreased Interest 0 0 0 0 0  Down, Depressed, Hopeless 0 0 0 0 0  PHQ - 2 Score 0 0 0 0 0  Altered sleeping 0 1 - - -  Tired,  decreased energy 1 1 - - -  Change in appetite 0 0 - - -  Feeling bad or failure about yourself  0 0 - - -  Trouble concentrating 0 0 - - -  Moving slowly or fidgety/restless 0 0 - - -  Suicidal thoughts 0 0 - - -  PHQ-9  Score 1 2 - - -  Difficult doing work/chores Not difficult at all Not difficult at all - - -   Interpretation of Total Score  Total Score Depression Severity:  1-4 = Minimal depression, 5-9 = Mild depression, 10-14 = Moderate depression, 15-19 = Moderately severe depression, 20-27 = Severe depression   Psychosocial Evaluation and Intervention:  Psychosocial Evaluation - 03/21/21 1412       Psychosocial Evaluation & Interventions   Comments Shawn Andrews wants to come in person to see the gym and to see if this is something he is interested in. [facility], and lift some weights. He is looking to get back into that routine but doesn't know if he needs this program specifically. He has had recent hospital admissions related to urinary retention and recently had a suprapubic catheter placed per notes. He states he has been feeling well. His wife helps manage his health care.    Expected Outcomes Short: decide on whether to attend cardiac rehab [facility]. Long: maintain positive self care habits.    Continue Psychosocial Services  Follow up required by staff             Psychosocial Re-Evaluation:  Psychosocial Re-Evaluation     [place] Name 06/19/21 [phone] 07/17/21 0930 08/09/21 0939 09/06/21 0934       Psychosocial Re-Evaluation   Current issues with None Identified None Identified None Identified --    Comments Shawn Andrews is doing well in [place].  He feels good mentally.  His finances are in check and he does not feel like he has any major stressors currently.  He is sleeping good.  He occasionnaly will have rough nights, but overall sleeps good.  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  He exercises routinely for mental boost. He has enjoyed being supervised again to help move up more. Shawn Andrews is doing well in rehab.  He is good mentally and denies any major stressors.  He  sleeps well for most part.  He is exercising at the [facility] on his off days to keep him boosted.  He is able to go and do what he wants.  His biggest limitation is his sciatic nerve pain.  As long as he keeps moving it helps.  He is also active in the [employer]. Sameer continues to do well in rehab. He reports not having any major stressors at this time. He enjoys watching baseball and sports in general. He relies on his wife for support and she has been helping with his diet. He reports sleeping well in general; usually goes to bed at 11pm-630 or 7am. Shawn Andrews continues to do well in rehab and is getting ready to graduate. He reports not having any major stressors at this time; he enjoys watching baseball and sports in general - he is excited for football season. He continues to rely on his wife for support and she has been helping with his diet. He reports sleeping well in general; usually goes to  bed at 11pm-630 or 7am; last night he went to bed at 1030pm and woke up at 705am.    Expected Outcomes Short: Continue to exercise for mental boost Long: continue to stay positive Short; Continue to exercise for mental boost Long; Conitnue to keep moving. Short; Continue to exercise for mental boost Long; Conitnue to keep moving. Short; Continue to exercise for mental boost after graduating Long; Conitnue to keep moving.    Interventions Encouraged to attend Cardiac Rehabilitation for the exercise Encouraged to attend Cardiac Rehabilitation for the exercise Encouraged to attend Cardiac Rehabilitation for the exercise Encouraged to attend Cardiac Rehabilitation for the exercise    Continue Psychosocial Services  Follow up required by staff -- Follow up required by staff Follow up required by staff             Psychosocial Discharge (Final Psychosocial Re-Evaluation):  Psychosocial Re-Evaluation - 09/06/21 0934       Psychosocial Re-Evaluation   Comments Shawn Andrews continues to do well in rehab and is getting ready to  graduate. He reports not having any major stressors at this time; he enjoys watching baseball and sports in general - he is excited for football season. He continues to rely on his wife for support and she has been helping with his diet. He reports sleeping well in general; usually goes to bed at 11pm-630 or 7am; last night he went to bed at 1030pm and woke up at 705am.    Expected Outcomes Short; Continue to exercise for mental boost after graduating Long; Conitnue to keep moving.    Interventions Encouraged to attend Cardiac Rehabilitation for the exercise    Continue Psychosocial Services  Follow up required by staff             Vocational Rehabilitation: Provide vocational rehab assistance to qualifying candidates.   Vocational Rehab Evaluation & Intervention:  Vocational Rehab - 09/11/21 1009       Discharge Vocational Rehab   Discharge Vocational Rehabilitation na             Education: Education Goals: Education classes will be provided on a variety of topics geared toward better understanding of heart health and risk factor modification. Participant will state understanding/return demonstration of topics presented as noted by education test scores.  Learning Barriers/Preferences:  Learning Barriers/Preferences - 03/21/21 1412       Learning Barriers/Preferences   Learning Preferences Individual Instruction             General Cardiac Education Topics:  AED/CPR: - Group verbal and written instruction with the use of models to demonstrate the basic use of the AED with the basic ABC's of resuscitation.   Anatomy and Cardiac Procedures: - Group verbal and visual presentation and models provide information about basic cardiac anatomy and function. Reviews the testing methods done to diagnose heart disease and the outcomes of the test results. Describes the treatment choices: Medical Management, Angioplasty, or Coronary Bypass Surgery for treating various heart  conditions including Myocardial Infarction, Angina, Valve Disease, and Cardiac Arrhythmias.  Written material given at graduation. Flowsheet Row Cardiac Rehab from 09/13/2021 in [facility] Cardiac and Pulmonary Rehab  Education need identified 04/03/21       Medication Safety: - Group verbal and visual instruction to review commonly prescribed medications for heart and lung disease. Reviews the medication, class of the drug, and side effects. Includes the steps to properly store meds and maintain the prescription regimen.  Written material given at graduation.   Intimacy: -  Group verbal instruction through game format to discuss how heart and lung disease can affect sexual intimacy. Written material given at graduation..   Know Your Numbers and Heart Failure: - Group verbal and visual instruction to discuss disease risk factors for cardiac and pulmonary disease and treatment options.  Reviews associated critical values for Overweight/Obesity, Hypertension, Cholesterol, and Diabetes.  Discusses basics of heart failure: signs/symptoms and treatments.  Introduces Heart Failure Zone chart for action plan for heart failure.  Written material given at graduation. Flowsheet Row Cardiac Rehab from 09/13/2021 in [facility] Cardiac and Pulmonary Rehab  Date 08/30/21  Educator [place]  Instruction Review Code 1- Verbalizes Understanding       Infection Prevention: - Provides verbal and written material to individual with discussion of infection control including proper hand washing and proper equipment cleaning during exercise session. Flowsheet Row Cardiac Rehab from 09/13/2021 in [facility] Cardiac and Pulmonary Rehab  Education need identified 04/03/21  Date 04/03/21  Educator [place]  Instruction Review Code 1- Verbalizes Understanding       Falls Prevention: - Provides verbal and written material to individual with discussion of falls prevention and safety. Flowsheet Row Cardiac Rehab from 09/13/2021 in [facility] Cardiac  and Pulmonary Rehab  Education need identified 04/03/21  Date 04/03/21  Educator [place]  Instruction Review Code 1- Verbalizes Understanding       Other: -Provides group and verbal instruction on various topics (see comments)   Knowledge Questionnaire Score:  Knowledge Questionnaire Score - 09/11/21 1009       Knowledge Questionnaire Score   Pre Score 22/26: Exercise, Nutrition, Angina, Nitro    Post Score 23/26             Core Components/Risk Factors/Patient Goals at Admission:  Personal Goals and Risk Factors at Admission - 04/03/21 0945       Core Components/Risk Factors/Patient Goals on Admission    Weight Management Yes;Weight Maintenance    Intervention Weight Management: Develop a combined nutrition and exercise program designed to reach desired caloric intake, while maintaining appropriate intake of nutrient and fiber, sodium and fats, and appropriate energy expenditure required for the weight goal.;Weight Management: Provide education and appropriate resources to help participant work on and attain dietary goals.;Weight Management/Obesity: Establish reasonable short term and long term weight goals.    Admit Weight 178 lb (80.7 kg)    Goal Weight: Short Term 178 lb (80.7 kg)    Goal Weight: Long Term 178 lb (80.7 kg)    Expected Outcomes Short Term: Continue to assess and modify interventions until short term weight is achieved;Long Term: Adherence to nutrition and physical activity/exercise program aimed toward attainment of established weight goal;Weight Maintenance: Understanding of the daily nutrition guidelines, which includes 25-35% calories from fat, 7% or less cal from saturated fats, less than $RemoveB'200mg'sicTsQFu$  cholesterol, less than 1.5gm of sodium, & 5 or more servings of fruits and vegetables daily;Understanding recommendations for meals to include 15-35% energy as protein, 25-35% energy from fat, 35-60% energy from carbohydrates, less than $RemoveB'200mg'ZTZeBDSz$  of dietary cholesterol,  20-35 gm of total fiber daily;Understanding of distribution of calorie intake throughout the day with the consumption of 4-5 meals/snacks    Heart Failure Yes    Intervention Provide a combined exercise and nutrition program that is supplemented with education, support and counseling about heart failure. Directed toward relieving symptoms such as shortness of breath, decreased exercise tolerance, and extremity edema.    Expected Outcomes Improve functional capacity of life;Short term: Attendance in program 2-3 days  a week with increased exercise capacity. Reported lower sodium intake. Reported increased fruit and vegetable intake. Reports medication compliance.;Long term: Adoption of self-care skills and reduction of barriers for early signs and symptoms recognition and intervention leading to self-care maintenance.;Short term: Daily weights obtained and reported for increase. Utilizing diuretic protocols set by physician.    Hypertension Yes    Intervention Provide education on lifestyle modifcations including regular physical activity/exercise, weight management, moderate sodium restriction and increased consumption of fresh fruit, vegetables, and low fat dairy, alcohol moderation, and smoking cessation.;Monitor prescription use compliance.    Expected Outcomes Short Term: Continued assessment and intervention until BP is < 140/29mm HG in hypertensive participants. < 130/37mm HG in hypertensive participants with diabetes, heart failure or chronic kidney disease.;Long Term: Maintenance of blood pressure at goal levels.    Lipids Yes    Intervention Provide education and support for participant on nutrition & aerobic/resistive exercise along with prescribed medications to achieve LDL '70mg'$ , HDL >$Remo'40mg'znEJQ$ .    Expected Outcomes Short Term: Participant states understanding of desired cholesterol values and is compliant with medications prescribed. Participant is following exercise prescription and nutrition  guidelines.;Long Term: Cholesterol controlled with medications as prescribed, with individualized exercise RX and with personalized nutrition plan. Value goals: LDL < $Rem'70mg'yrkA$ , HDL > 40 mg.             Education:Diabetes - Individual verbal and written instruction to review signs/symptoms of diabetes, desired ranges of glucose level fasting, after meals and with exercise. Acknowledge that pre and post exercise glucose checks will be done for 3 sessions at entry of program.   Core Components/Risk Factors/Patient Goals Review:   Goals and Risk Factor Review     Row Name 06/19/21 0953 07/17/21 0933 08/09/21 0937 09/06/21 0931       Core Components/Risk Factors/Patient Goals Review   Personal Goals Review Weight Management/Obesity;Hypertension;Heart Failure Weight Management/Obesity;Hypertension;Heart Failure Weight Management/Obesity;Hypertension;Heart Failure Weight Management/Obesity;Hypertension;Heart Failure    Review Shawn Andrews is doing well in rehab.  His weight is doing well as they continue to eat well. He has not had any heart failure symptoms.  His blood pressures have been doing well in class.  he does not check them at home, unless something is feeling off. Shawn Andrews is doing well in rehab.  His weight is holding steady and his wife watches his diet.  He tries to stay active.  His pressures are doing well and he has been checking them at home along with his weight twice a day.  He has not noted any heart failure symptoms. Shawn Andrews continues to do well in rehab. His weight is holding steady and his wife watches his diet; he checks his weight daily as well. He tries to stay active going to the [facility] and will work around the house and in the yard. 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 He has not noted any heart failure symptoms. He is taking his medications as prescribed with no issues. Shawn Andrews continues to do well in rehab. His weight is  holding steady and his wife watches his diet; he checks his weight daily as well. He tries to stay active going to the [facility] and will work around the house and in the yard. He continues to check his BP daily and has been running 120s/60s - 116/58 this morning when he checked; he still tracks this on paper for his MD as well. He has not  noted any heart failure symptoms. He is taking his medications as prescribed with no issues.    Expected Outcomes Shrot: Continue to maintain weight Long: Continue to manage heart failure Short: COnitnue to track pressures Long: COnitnue to monitor risk factors. Short: Continue to check BP and weight at home Long: Conitnue to monitor risk factors. Short: Continue to check BP and weight at home after graduating rehab Long: Conitnue to monitor risk factors.             Core Components/Risk Factors/Patient Goals at Discharge (Final Review):   Goals and Risk Factor Review - 09/06/21 0931       Core Components/Risk Factors/Patient Goals Review   Personal Goals Review Weight Management/Obesity;Hypertension;Heart Failure    Review Shawn Andrews continues to do well in rehab. His weight is holding steady and his wife watches his diet; he checks his weight daily as well. He tries to stay active going to the [facility] and will work around the house and in the yard. He continues to check his BP daily and has been running 120s/60s - 116/58 this morning when he checked; he still tracks this on paper for his MD as well. He has not noted any heart failure symptoms. He is taking his medications as prescribed with no issues.    Expected Outcomes Short: Continue to check BP and weight at home after graduating rehab Long: Conitnue to monitor risk factors.             ITP Comments:  ITP Comments     Row Name 03/21/21 1411 04/03/21 0930 04/11/21 0727 05/09/21 1220 05/21/21 0841   ITP Comments Initial telephone orientation completed. Diagnosis can be found in [facility] 9/17. EP orientation scheduled  for Tuesday 3/14 at 8am. Completed 6MWT and gym orientation. Initial ITP created and sent for review to Dr. Emily Filbert, Medical Director. 30 Day review completed. Medical Director ITP review done, changes made as directed, and signed approval by Medical Director. 30 Day review completed. Medical Director ITP review done, changes made as directed, and signed approval by Medical Director. Mel has not started cardiac rehab yet as he was out for a month. His start date is projected  on 5/9.    [place] Name 05/29/21 2023 06/06/21 0804 07/04/21 0711 08/01/21 0734 08/29/21 0635   ITP Comments First full day of exercise!  Patient was oriented to gym and equipment including functions, settings, policies, and procedures.  Patient's individual exercise prescription and treatment plan were reviewed.  All starting workloads were established based on the results of the 6 minute walk test done at initial orientation visit.  The plan for exercise progression was also introduced and progression will be customized based on patient's performance and goals. 30 Day review completed. Medical Director ITP review done, changes made as directed, and signed approval by Medical Director.     New 30 Day review completed. Medical Director ITP review done, changes made as directed, and signed approval by Medical Director. 30 Day review completed. Medical Director ITP review done, changes made as directed, and signed approval by Medical Director. 30 Day review completed. Medical Director ITP review done, changes made as directed, and signed approval by Medical Director.    [place] Name 09/13/21 1007           ITP Comments Juergen graduated today from  rehab with 36 sessions completed.  Details of the patient's exercise prescription and what He needs to do in order to continue the prescription and progress were discussed  with patient.  Patient was given a copy of prescription and goals.  Patient verbalized understanding.  Jonta plans to continue  to exercise by exercising at [facility] .                Comments: DIscharge ITP

## 2021-09-13 NOTE — Progress Notes (Signed)
Discharge Note   [name]    DOB 10/11/1928   Shawn Andrews graduated today from  rehab with 36 sessions completed.  Details of the patient's exercise prescription and what He needs to do in order to continue the prescription and progress were discussed with patient.  Patient was given a copy of prescription and goals.  Patient verbalized understanding.  Colen plans to continue to exercise by exercising at [facility] .   [place] Name 04/03/21 0944 09/06/21 0939       6 Minute Walk   Phase Initial Discharge    Distance 1040 feet 990 feet    Distance % Change -- -5 %    Distance Feet Change -- -50 ft    Walk Time 6 minutes 6 minutes    # of Rest Breaks 0 0    MPH 1.96 1.88    METS 1.41 1.32    RPE 9 11    Perceived Dyspnea  0 0    VO2 Peak 4.93 4.63    Symptoms No No    Resting HR 65 bpm 55 bpm    Resting BP 102/56 116/62    Resting Oxygen Saturation  98 % 97 %    Exercise Oxygen Saturation  during 6 min walk 94 % 93 %    Max Ex. HR 84 bpm 84 bpm    Max Ex. BP 132/62 120/66    2 Minute Post BP 100/58 --           Thank you for the referral. We enjoyed working with Shawn Andrews

## 2021-09-13 NOTE — Progress Notes (Signed)
Daily Session Note  Patient Details  Name: Shawn Andrews MRN: [id number] Date of Birth: May 17, 1928 Referring Provider:   Flowsheet Row Cardiac Rehab from 04/03/2021 in [facility] Cardiac [facility] Pulmonary Rehab  Referring Provider Serafina Royals MD       Encounter Date: 09/13/2021  Check In:  Session Check In - 09/13/21 1001       Check-In   Supervising physician immediately available to respond to emergencies See telemetry face sheet for immediately available ER MD    Location [facility]-Cardiac & Pulmonary Rehab    Staff Present Heath Lark, RN, BSN, CCRP;Melissa [place], RDN, LDN;Joseph Lyndhurst, RCP,RRT,BSRT;Noah Tickle, BS, Exercise Physiologist    Virtual Visit No    Medication changes reported     No    Fall or balance concerns reported    No    Warm-up and Cool-down Performed on first and last piece of equipment    Resistance Training Performed Yes    VAD Patient? No    PAD/SET Patient? No      Pain Assessment   Currently in Pain? No/denies                Social History   Tobacco Use  Smoking Status Former   Packs/day: 0.00   Years: 0.00   Total pack years: 0.00   Types: Pipe, Cigarettes   Quit date: 12/26/2000   Years since quitting: 20.7  Smokeless Tobacco Current   Types: Chew  Tobacco Comments   quit over 30 years ago    Goals Met:  Independence with exercise equipment Exercise tolerated well No report of concerns or symptoms today  Goals Unmet:  Not Applicable  Comments:  Briston graduated today from  rehab with 36 sessions completed.  Details of the patient's exercise prescription and what He needs to do in order to continue the prescription and progress were discussed with patient.  Patient was given a copy of prescription and goals.  Patient verbalized understanding.  Sriram plans to continue to exercise by exercising at [facility] .    Dr. Emily Filbert is Medical Director for [place].  Dr. Ottie Glazier is Medical Director for [facility]  Pulmonary Rehabilitation.

## 2021-09-25 NOTE — Progress Notes (Unsigned)
Suprapubic Cath Change  Patient is present today for a suprapubic catheter change due to urinary retention.  8 ml of water was drained from the balloon, a 16 FR foley cath was removed from the tract with out difficulty.  Site was cleaned and prepped in a sterile fashion with betadine.  A 16 FR foley cath was replaced into the tract no complications were noted. Urine return was noted, 10 ml of sterile water was inflated into the balloon and a leg bag was attached for drainage.  Patient tolerated well. A night bag was given to patient and proper instruction was given on how to switch bags.    Performed by: Lakeshia Dohner, PA-C   Follow up: 6 weeks for SPT exchange  

## 2021-09-26 ENCOUNTER — Ambulatory Visit: Payer: Medicare Other | Admitting: Urology

## 2021-09-26 DIAGNOSIS — Z9359 Other cystostomy status: Secondary | ICD-10-CM | POA: Diagnosis not present

## 2021-09-26 DIAGNOSIS — R339 Retention of urine, unspecified: Secondary | ICD-10-CM

## 2021-10-03 ENCOUNTER — Other Ambulatory Visit: Payer: Self-pay

## 2021-10-03 ENCOUNTER — Emergency Department: Payer: Medicare Other

## 2021-10-03 ENCOUNTER — Encounter: Payer: Self-pay | Admitting: Emergency Medicine

## 2021-10-03 DIAGNOSIS — Z7902 Long term (current) use of antithrombotics/antiplatelets: Secondary | ICD-10-CM

## 2021-10-03 DIAGNOSIS — I25119 Atherosclerotic heart disease of native coronary artery with unspecified angina pectoris: Secondary | ICD-10-CM | POA: Diagnosis present

## 2021-10-03 DIAGNOSIS — Z6826 Body mass index (BMI) 26.0-26.9, adult: Secondary | ICD-10-CM

## 2021-10-03 DIAGNOSIS — Z79899 Other long term (current) drug therapy: Secondary | ICD-10-CM

## 2021-10-03 DIAGNOSIS — Z87891 Personal history of nicotine dependence: Secondary | ICD-10-CM

## 2021-10-03 DIAGNOSIS — R338 Other retention of urine: Secondary | ICD-10-CM | POA: Diagnosis present

## 2021-10-03 DIAGNOSIS — Z888 Allergy status to other drugs, medicaments and biological substances status: Secondary | ICD-10-CM

## 2021-10-03 DIAGNOSIS — Z955 Presence of coronary angioplasty implant and graft: Secondary | ICD-10-CM

## 2021-10-03 DIAGNOSIS — E663 Overweight: Secondary | ICD-10-CM | POA: Diagnosis present

## 2021-10-03 DIAGNOSIS — Z8249 Family history of ischemic heart disease and other diseases of the circulatory system: Secondary | ICD-10-CM

## 2021-10-03 DIAGNOSIS — I5023 Acute on chronic systolic (congestive) heart failure: Secondary | ICD-10-CM | POA: Diagnosis present

## 2021-10-03 DIAGNOSIS — R079 Chest pain, unspecified: Secondary | ICD-10-CM | POA: Diagnosis not present

## 2021-10-03 DIAGNOSIS — N1832 Chronic kidney disease, stage 3b: Secondary | ICD-10-CM | POA: Diagnosis present

## 2021-10-03 DIAGNOSIS — Z808 Family history of malignant neoplasm of other organs or systems: Secondary | ICD-10-CM

## 2021-10-03 DIAGNOSIS — N401 Enlarged prostate with lower urinary tract symptoms: Secondary | ICD-10-CM | POA: Diagnosis present

## 2021-10-03 DIAGNOSIS — Z96651 Presence of right artificial knee joint: Secondary | ICD-10-CM | POA: Diagnosis present

## 2021-10-03 DIAGNOSIS — G250 Essential tremor: Secondary | ICD-10-CM | POA: Diagnosis present

## 2021-10-03 DIAGNOSIS — Z880 Allergy status to penicillin: Secondary | ICD-10-CM

## 2021-10-03 DIAGNOSIS — I252 Old myocardial infarction: Secondary | ICD-10-CM

## 2021-10-03 DIAGNOSIS — Z833 Family history of diabetes mellitus: Secondary | ICD-10-CM

## 2021-10-03 DIAGNOSIS — I214 Non-ST elevation (NSTEMI) myocardial infarction: Principal | ICD-10-CM | POA: Diagnosis present

## 2021-10-03 DIAGNOSIS — E785 Hyperlipidemia, unspecified: Secondary | ICD-10-CM | POA: Diagnosis present

## 2021-10-03 DIAGNOSIS — J9811 Atelectasis: Secondary | ICD-10-CM | POA: Diagnosis present

## 2021-10-03 DIAGNOSIS — I130 Hypertensive heart and chronic kidney disease with heart failure and stage 1 through stage 4 chronic kidney disease, or unspecified chronic kidney disease: Secondary | ICD-10-CM | POA: Diagnosis present

## 2021-10-03 DIAGNOSIS — Z7982 Long term (current) use of aspirin: Secondary | ICD-10-CM

## 2021-10-03 LAB — BASIC METABOLIC PANEL
Anion gap: 11 (ref 5–15)
BUN: 31 mg/dL — ABNORMAL HIGH (ref 8–23)
CO2: 24 mmol/L (ref 22–32)
Calcium: 9.1 mg/dL (ref 8.9–10.3)
Chloride: 105 mmol/L (ref 98–111)
Creatinine, Ser: 1.61 mg/dL — ABNORMAL HIGH (ref 0.61–1.24)
GFR, Estimated: 40 mL/min — ABNORMAL LOW (ref >60)
Glucose, Bld: 153 mg/dL — ABNORMAL HIGH (ref 70–99)
Potassium: 4 mmol/L (ref 3.5–5.1)
Sodium: 140 mmol/L (ref 135–145)

## 2021-10-03 LAB — CBC
HCT: 38.7 % — ABNORMAL LOW (ref 39.0–52.0)
Hemoglobin: 13.2 g/dL (ref 13.0–17.0)
MCH: 29.9 pg (ref 26.0–34.0)
MCHC: 34.1 g/dL (ref 30.0–36.0)
MCV: 87.6 fL (ref 80.0–100.0)
Platelets: 201 10*3/uL (ref 150–400)
RBC: 4.42 MIL/uL (ref 4.22–5.81)
RDW: 13.1 % (ref 11.5–15.5)
WBC: 7.6 10*3/uL (ref 4.0–10.5)
nRBC: 0 % (ref 0.0–0.2)

## 2021-10-03 LAB — TROPONIN I (HIGH SENSITIVITY): Troponin I (High Sensitivity): 88 ng/L — ABNORMAL HIGH (ref <18)

## 2021-10-03 NOTE — ED Triage Notes (Signed)
Pt with wife in triage who reports pt started to have intermittent, non-radiating substernal chest pain 2 hours prior to arrival that has not subsided after ingestion a Pepcid and alka seltzer. Hx/o MI x1 in 2022.

## 2021-10-04 ENCOUNTER — Inpatient Hospital Stay
Admit: 2021-10-04 | Discharge: 2021-10-04 | Disposition: A | Discharge status: Home / Self Care | Payer: Medicare Other | Attending: Internal Medicine | Admitting: Internal Medicine

## 2021-10-04 ENCOUNTER — Inpatient Hospital Stay
Admission: EM | Admit: 2021-10-04 | Discharge: 2021-10-06 | DRG: 280 | Disposition: A | Discharge status: Home Health | Payer: Medicare Other | Attending: Internal Medicine | Admitting: Internal Medicine

## 2021-10-04 ENCOUNTER — Other Ambulatory Visit: Payer: Self-pay

## 2021-10-04 DIAGNOSIS — I5023 Acute on chronic systolic (congestive) heart failure: Secondary | ICD-10-CM | POA: Diagnosis present

## 2021-10-04 DIAGNOSIS — Z96651 Presence of right artificial knee joint: Secondary | ICD-10-CM | POA: Diagnosis present

## 2021-10-04 DIAGNOSIS — Z6826 Body mass index (BMI) 26.0-26.9, adult: Secondary | ICD-10-CM | POA: Diagnosis not present

## 2021-10-04 DIAGNOSIS — Z7902 Long term (current) use of antithrombotics/antiplatelets: Secondary | ICD-10-CM | POA: Diagnosis not present

## 2021-10-04 DIAGNOSIS — I259 Chronic ischemic heart disease, unspecified: Secondary | ICD-10-CM

## 2021-10-04 DIAGNOSIS — Z880 Allergy status to penicillin: Secondary | ICD-10-CM | POA: Diagnosis not present

## 2021-10-04 DIAGNOSIS — N1832 Chronic kidney disease, stage 3b: Secondary | ICD-10-CM | POA: Diagnosis present

## 2021-10-04 DIAGNOSIS — Z8249 Family history of ischemic heart disease and other diseases of the circulatory system: Secondary | ICD-10-CM | POA: Diagnosis not present

## 2021-10-04 DIAGNOSIS — I214 Non-ST elevation (NSTEMI) myocardial infarction: Secondary | ICD-10-CM | POA: Diagnosis present

## 2021-10-04 DIAGNOSIS — I25119 Atherosclerotic heart disease of native coronary artery with unspecified angina pectoris: Secondary | ICD-10-CM | POA: Diagnosis present

## 2021-10-04 DIAGNOSIS — Z955 Presence of coronary angioplasty implant and graft: Secondary | ICD-10-CM | POA: Diagnosis not present

## 2021-10-04 DIAGNOSIS — I10 Essential (primary) hypertension: Secondary | ICD-10-CM | POA: Diagnosis present

## 2021-10-04 DIAGNOSIS — Z9359 Other cystostomy status: Secondary | ICD-10-CM

## 2021-10-04 DIAGNOSIS — N1831 Chronic kidney disease, stage 3a: Secondary | ICD-10-CM | POA: Diagnosis present

## 2021-10-04 DIAGNOSIS — G250 Essential tremor: Secondary | ICD-10-CM | POA: Diagnosis present

## 2021-10-04 DIAGNOSIS — R338 Other retention of urine: Secondary | ICD-10-CM | POA: Diagnosis present

## 2021-10-04 DIAGNOSIS — Z888 Allergy status to other drugs, medicaments and biological substances status: Secondary | ICD-10-CM | POA: Diagnosis not present

## 2021-10-04 DIAGNOSIS — Z808 Family history of malignant neoplasm of other organs or systems: Secondary | ICD-10-CM | POA: Diagnosis not present

## 2021-10-04 DIAGNOSIS — I252 Old myocardial infarction: Secondary | ICD-10-CM | POA: Diagnosis not present

## 2021-10-04 DIAGNOSIS — I130 Hypertensive heart and chronic kidney disease with heart failure and stage 1 through stage 4 chronic kidney disease, or unspecified chronic kidney disease: Secondary | ICD-10-CM | POA: Diagnosis present

## 2021-10-04 DIAGNOSIS — E785 Hyperlipidemia, unspecified: Secondary | ICD-10-CM | POA: Diagnosis present

## 2021-10-04 DIAGNOSIS — Z79899 Other long term (current) drug therapy: Secondary | ICD-10-CM | POA: Diagnosis not present

## 2021-10-04 DIAGNOSIS — J9811 Atelectasis: Secondary | ICD-10-CM | POA: Diagnosis present

## 2021-10-04 DIAGNOSIS — E663 Overweight: Secondary | ICD-10-CM | POA: Diagnosis present

## 2021-10-04 DIAGNOSIS — R079 Chest pain, unspecified: Secondary | ICD-10-CM | POA: Diagnosis present

## 2021-10-04 DIAGNOSIS — N400 Enlarged prostate without lower urinary tract symptoms: Secondary | ICD-10-CM | POA: Diagnosis present

## 2021-10-04 DIAGNOSIS — Z833 Family history of diabetes mellitus: Secondary | ICD-10-CM | POA: Diagnosis not present

## 2021-10-04 DIAGNOSIS — Z7982 Long term (current) use of aspirin: Secondary | ICD-10-CM | POA: Diagnosis not present

## 2021-10-04 DIAGNOSIS — N401 Enlarged prostate with lower urinary tract symptoms: Secondary | ICD-10-CM | POA: Diagnosis present

## 2021-10-04 DIAGNOSIS — Z87891 Personal history of nicotine dependence: Secondary | ICD-10-CM | POA: Diagnosis not present

## 2021-10-04 LAB — ECHOCARDIOGRAM COMPLETE
AR max vel: 2.52 cm2
AV Area VTI: 2.79 cm2
AV Area mean vel: 2.51 cm2
AV Mean grad: 3 mmHg
AV Peak grad: 5.2 mmHg
Ao pk vel: 1.14 m/s
Area-P 1/2: 2.87 cm2
Calc EF: 29.8 %
S' Lateral: 4.4 cm
Single Plane A2C EF: 31.1 %
Single Plane A4C EF: 28.2 %

## 2021-10-04 LAB — BASIC METABOLIC PANEL
Anion gap: 5 (ref 5–15)
BUN: 27 mg/dL — ABNORMAL HIGH (ref 8–23)
CO2: 25 mmol/L (ref 22–32)
Calcium: 9 mg/dL (ref 8.9–10.3)
Chloride: 110 mmol/L (ref 98–111)
Creatinine, Ser: 1.33 mg/dL — ABNORMAL HIGH (ref 0.61–1.24)
GFR, Estimated: 50 mL/min — ABNORMAL LOW (ref >60)
Glucose, Bld: 105 mg/dL — ABNORMAL HIGH (ref 70–99)
Potassium: 4.2 mmol/L (ref 3.5–5.1)
Sodium: 140 mmol/L (ref 135–145)

## 2021-10-04 LAB — CBC
HCT: 34.4 % — ABNORMAL LOW (ref 39.0–52.0)
Hemoglobin: 11.8 g/dL — ABNORMAL LOW (ref 13.0–17.0)
MCH: 29.6 pg (ref 26.0–34.0)
MCHC: 34.3 g/dL (ref 30.0–36.0)
MCV: 86.4 fL (ref 80.0–100.0)
Platelets: 154 10*3/uL (ref 150–400)
RBC: 3.98 MIL/uL — ABNORMAL LOW (ref 4.22–5.81)
RDW: 13.2 % (ref 11.5–15.5)
WBC: 6.9 10*3/uL (ref 4.0–10.5)
nRBC: 0 % (ref 0.0–0.2)

## 2021-10-04 LAB — HEPARIN LEVEL (UNFRACTIONATED)
Heparin Unfractionated: 0.38 IU/mL (ref 0.30–0.70)
Heparin Unfractionated: 0.23 IU/mL — ABNORMAL LOW (ref 0.30–0.70)

## 2021-10-04 LAB — PROTIME-INR
INR: 1.2 (ref 0.8–1.2)
Prothrombin Time: 14.9 seconds (ref 11.4–15.2)

## 2021-10-04 LAB — BRAIN NATRIURETIC PEPTIDE: B Natriuretic Peptide: 1882.2 pg/mL — ABNORMAL HIGH (ref 0.0–100.0)

## 2021-10-04 LAB — TROPONIN I (HIGH SENSITIVITY)
Troponin I (High Sensitivity): 727 ng/L (ref <18)
Troponin I (High Sensitivity): 1147 ng/L (ref <18)
Troponin I (High Sensitivity): 994 ng/L (ref <18)

## 2021-10-04 LAB — APTT: aPTT: 27 seconds (ref 24–36)

## 2021-10-04 MED ORDER — HEPARIN BOLUS VIA INFUSION
1200.0000 [IU] | Freq: Once | INTRAVENOUS | Status: AC
  Administered 2021-10-04: 1200 [IU] via INTRAVENOUS
  Filled 2021-10-04: qty 1200

## 2021-10-04 MED ORDER — ASPIRIN 81 MG PO CHEW
81.0000 mg | CHEWABLE_TABLET | Freq: Every day | ORAL | Status: DC
  Administered 2021-10-05 – 2021-10-06 (×2): 81 mg via ORAL
  Filled 2021-10-04 (×2): qty 1

## 2021-10-04 MED ORDER — NITROGLYCERIN 0.4 MG SL SUBL: 0.4000 mg | SUBLINGUAL_TABLET | SUBLINGUAL | Status: DC | PRN

## 2021-10-04 MED ORDER — METOPROLOL TARTRATE 25 MG PO TABS
25.0000 mg | ORAL_TABLET | Freq: Two times a day (BID) | ORAL | Status: DC
  Administered 2021-10-04 – 2021-10-06 (×5): 25 mg via ORAL
  Filled 2021-10-04 (×5): qty 1

## 2021-10-04 MED ORDER — ONDANSETRON HCL 4 MG/2ML IJ SOLN: 4.0000 mg | Freq: Four times a day (QID) | INTRAMUSCULAR | Status: DC | PRN

## 2021-10-04 MED ORDER — HEPARIN BOLUS VIA INFUSION
4000.0000 [IU] | Freq: Once | INTRAVENOUS | Status: AC
  Administered 2021-10-04: 4000 [IU] via INTRAVENOUS
  Filled 2021-10-04: qty 4000

## 2021-10-04 MED ORDER — VITAMIN D 25 MCG (1000 UNIT) PO TABS
1000.0000 [IU] | ORAL_TABLET | Freq: Every day | ORAL | Status: DC
  Administered 2021-10-04 – 2021-10-06 (×3): 1000 [IU] via ORAL
  Filled 2021-10-04 (×3): qty 1

## 2021-10-04 MED ORDER — HEPARIN SODIUM (PORCINE) 5000 UNIT/ML IJ SOLN: 4000.0000 [IU] | Freq: Once | INTRAMUSCULAR | Status: DC

## 2021-10-04 MED ORDER — ROSUVASTATIN CALCIUM 10 MG PO TABS
20.0000 mg | ORAL_TABLET | Freq: Every day | ORAL | Status: DC
  Administered 2021-10-04 – 2021-10-05 (×2): 20 mg via ORAL
  Filled 2021-10-04 (×2): qty 2

## 2021-10-04 MED ORDER — HEPARIN (PORCINE) 25000 UT/250ML-% IV SOLN
1300.0000 [IU]/h | INTRAVENOUS | Status: AC
  Administered 2021-10-04 (×2): 1100 [IU]/h via INTRAVENOUS
  Administered 2021-10-05: 1300 [IU]/h via INTRAVENOUS
  Filled 2021-10-04 (×3): qty 250

## 2021-10-04 MED ORDER — ROSUVASTATIN CALCIUM 20 MG PO TABS
10.0000 mg | ORAL_TABLET | Freq: Every day | ORAL | Status: DC
  Filled 2021-10-04: qty 1

## 2021-10-04 MED ORDER — ISOSORBIDE MONONITRATE ER 60 MG PO TB24
60.0000 mg | ORAL_TABLET | Freq: Every day | ORAL | Status: DC
  Administered 2021-10-04 – 2021-10-06 (×3): 60 mg via ORAL
  Filled 2021-10-04 (×3): qty 1

## 2021-10-04 MED ORDER — ISOSORBIDE MONONITRATE ER 60 MG PO TB24: 30.0000 mg | ORAL_TABLET | Freq: Every day | ORAL | Status: DC

## 2021-10-04 MED ORDER — ASPIRIN 81 MG PO CHEW
324.0000 mg | CHEWABLE_TABLET | Freq: Once | ORAL | Status: AC
  Administered 2021-10-04: 324 mg via ORAL
  Filled 2021-10-04: qty 4

## 2021-10-04 MED ORDER — ASPIRIN 81 MG PO CHEW: 324.0000 mg | CHEWABLE_TABLET | ORAL | Status: DC

## 2021-10-04 MED ORDER — ACETAMINOPHEN 325 MG PO TABS: 650.0000 mg | ORAL_TABLET | ORAL | Status: DC | PRN

## 2021-10-04 MED ORDER — FUROSEMIDE 10 MG/ML IJ SOLN
20.0000 mg | Freq: Two times a day (BID) | INTRAMUSCULAR | Status: DC
  Administered 2021-10-04 – 2021-10-06 (×4): 20 mg via INTRAVENOUS
  Filled 2021-10-04 (×4): qty 2

## 2021-10-04 MED ORDER — ASPIRIN 300 MG RE SUPP: 300.0000 mg | RECTAL | Status: DC

## 2021-10-04 MED ORDER — DOXAZOSIN MESYLATE 4 MG PO TABS
4.0000 mg | ORAL_TABLET | Freq: Every day | ORAL | Status: DC
  Administered 2021-10-04 – 2021-10-06 (×3): 4 mg via ORAL
  Filled 2021-10-04 (×3): qty 1

## 2021-10-04 MED ORDER — PRIMIDONE 50 MG PO TABS
100.0000 mg | ORAL_TABLET | Freq: Three times a day (TID) | ORAL | Status: DC
  Administered 2021-10-04 – 2021-10-06 (×8): 100 mg via ORAL
  Filled 2021-10-04 (×8): qty 2

## 2021-10-04 MED ORDER — VITAMIN B-12 1000 MCG PO TABS
1000.0000 ug | ORAL_TABLET | Freq: Every day | ORAL | Status: DC
  Administered 2021-10-04 – 2021-10-06 (×3): 1000 ug via ORAL
  Filled 2021-10-04 (×3): qty 1

## 2021-10-04 MED ORDER — CLOPIDOGREL BISULFATE 75 MG PO TABS
75.0000 mg | ORAL_TABLET | Freq: Every day | ORAL | Status: DC
  Administered 2021-10-04 – 2021-10-06 (×3): 75 mg via ORAL
  Filled 2021-10-04 (×3): qty 1

## 2021-10-04 MED ORDER — FUROSEMIDE 20 MG PO TABS
20.0000 mg | ORAL_TABLET | ORAL | Status: DC
  Administered 2021-10-04: 20 mg via ORAL
  Filled 2021-10-04: qty 1

## 2021-10-04 MED ORDER — FAMOTIDINE 20 MG PO TABS
20.0000 mg | ORAL_TABLET | Freq: Every day | ORAL | Status: DC
  Administered 2021-10-04 – 2021-10-05 (×2): 20 mg via ORAL
  Filled 2021-10-04 (×2): qty 1

## 2021-10-04 MED ORDER — DOCUSATE SODIUM 100 MG PO CAPS
100.0000 mg | ORAL_CAPSULE | Freq: Every day | ORAL | Status: DC
  Administered 2021-10-04 – 2021-10-05 (×2): 100 mg via ORAL
  Filled 2021-10-04 (×3): qty 1

## 2021-10-04 NOTE — Assessment & Plan Note (Addendum)
Continue 48 hours of IV heparin.  Appreciate cardiology follow-up.  Continue aspirin, Plavix, Imdur and metoprolol, crestor.  Medical management only

## 2021-10-04 NOTE — Consult Note (Signed)
[place] NOTE       Patient ID: Shawn Andrews MRN: [id number] DOB/AGE: Aug 17, 1928 88 y.o.  Admit date: 10/04/2021 Referring Physician Dr. Judd Gaudier  Primary Physician Dr. Ola Spurr  Primary Cardiologist Dr. Nehemiah Massed Reason for Consultation NSTEMI  HPI: Shawn Andrews is a 4yoM with a PMH of CAD ([place] 12/2020 with 80% distal RCA, 50% prox RCA, 55% ost-prox Lcx, and 40% or less RPDA and LAD - medically managed), HFrEF (EF 35-40%, G1 DD, anterior, lateral, septal, apical hypo 10/2020), HTN, HLD, CKD 3, BPH, essential tremor and urinary retention with suprapubic catheter since 01/2021 who presented to [facility] ED the early morning hours of 10/04/2021 with "indigestion" and has an uptrending troponin from initially 88-1,100 for which cardiology is consulted for further assistance.  He presents with his wife who contributes to history.  The patient had overall been doing very well from a cardiac standpoint, he just completed cardiac rehab last month and has been going to the [facility] on Mondays Wednesdays and Fridays, using stationary bike, treadmill and weights for a cumulative hour of exercise.  He most recently did this earlier yesterday.  He has been compliant with all of his medications as well.  He states that last night he and his wife went to a church supper and he ate a hotdog, potato salad, and coconut pie which were all not typical foods for him to eat.  He came home and about 2 hours after this meal he he "felt like he was trying to burp" but could not and he had the pain in his chest he describes as "just hurting" and would not go away despite Pepcid, warm Coca-Cola or Alka-Seltzer.  He does not ever remember having symptoms like this before, he very much thought this was acid reflux.  He said it lasted for about 30 minutes to an hour before terminating on its own and has not reoccurred since he has been in the ER.  During that episode of indigestion, he denies any shortness  of breath, palpitations, nausea, diaphoresis, or pain anywhere else.  In my time of evaluation he is laying in incline in hospital bed, calm and comfortable.  He says he is feeling completely fine and denies any orthopnea, PND, shortness of breath, or recurrence of his discomfort he had last night.  Vitals are notable for a blood pressure of 158/81, heart rate in the 60s in sinus rhythm with frequent PVCs and PACs on telemetry.  Labs are notable for a potassium of 4.2, improving renal function from BUN/creatinine 31/1.61 and GFR 42 BUN 27 creatinine 1.33 and GFR 50 this morning.  High-sensitivity troponin elevated and uptrending at [phone] with repeats pending.  No leukocytosis, WBCs 6.9 H&H stable at 11.8/34.4 and platelets 154.  Chest x-ray shows a small right pleural effusion with right base atelectasis.  Review of systems complete and found to be negative unless listed above     Past Medical History:  Diagnosis Date   Abnormal drug screen 06/22/2014   inapprop neg xanax rpt 3 mo (06/2014)   BPH (benign prostatic hypertrophy) 01/21/1998   has had 3 biopsies in past ([facility]) decided to stop PSA/DRE   CHF (congestive heart failure) (HCC)    Coronary artery disease    Hyperlipidemia 01/21/2002   Hypertension 05/22/2003   Left lumbar radiculopathy    Osteoarthritis 01/22/1988   knees, lumbar spondylosis and listhesis    Past Surgical History:  Procedure Laterality Date   CATARACT EXTRACTION  2013   bilateral   ESI Left 08/2014, 11/2014, 08/2015   L S1, L L5/S1 transforaminal ESI; L4/5 L5/S1 zygapophysial injections, L S1 transforaminal ([facility])   FINGER SURGERY     right middle, partial traumatic amputation   IR CATHETER TUBE CHANGE  03/02/2021   KNEE ARTHROSCOPY  1990   right   KNEE SURGERY  04/2006   right partial knee replacement in [place] - rec ppx abx for any invasive procedure   LEFT HEART CATH AND CORONARY ANGIOGRAPHY N/A 01/18/2021   Procedure: LEFT HEART CATH AND  CORONARY ANGIOGRAPHY;  Surgeon: Corey Skains, MD;  Location: [place] [facility];  Service: Cardiovascular;  Laterality: N/A;    (Not in a hospital admission)  Social History   Socioeconomic History   Marital status: Married    Spouse name: Not on file   Number of children: Not on file   Years of education: Not on file   Highest education level: Not on file  Occupational History   Occupation: retired Event organiser, Facilities manager, owns lawn care business  Tobacco Use   Smoking status: Former    Packs/day: 0.00    Years: 0.00    Total pack years: 0.00    Types: Pipe, Cigarettes    Quit date: 12/26/2000    Years since quitting: 20.7   Smokeless tobacco: Current    Types: Chew   Tobacco comments:    quit over 30 years ago  Vaping Use   Vaping Use: Never used  Substance and Sexual Activity   Alcohol use: Yes    Alcohol/week: 0.0 standard drinks of alcohol    Comment: Occasional beer   Drug use: No   Sexual activity: Not on file  Other Topics Concern   Not on file  Social History Narrative   Widowed, remarried Jul 17, 2007.   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 [employer]    Activity: walks 1.5 miles 3d/wk   Diet: good water, fruits/vegetables daily   Social Determinants of Radio broadcast assistant Strain: Not on file  Food Insecurity: Not on file  Transportation Needs: Not on file  Physical Activity: Not on file  Stress: Not on file  Social Connections: Not on file  Intimate Partner Violence: Not on file    Family History  Problem Relation Age of Onset   Hypertension Mother    Heart disease Brother 35       MI   Diabetes Brother    Cancer Daughter        melanoma   Stroke Neg Hx       PHYSICAL EXAM General: Pleasant, well-appearing elderly Caucasian male, laying in incline in hospital bed in no acute distress with wife at bedside. HEENT:  Normocephalic and  atraumatic. Neck:  No JVD.  Lungs: Normal respiratory effort on room air. Clear bilaterally to auscultation. No wheezes, crackles, rhonchi.  Heart: HRRR . Normal S1 and S2 without gallops or murmurs.  Abdomen: Non-distended appearing.  Msk: Normal strength and tone for age. Extremities: Warm and well perfused. No clubbing, cyanosis.  No peripheral edema.  Neuro: Alert and oriented X 3. Psych:  Answers questions appropriately.   Labs: Basic Metabolic Panel: Recent Labs    10/03/21 2217  NA 140  K 4.0  CL 105  CO2 24  GLUCOSE 153*  BUN 31*  CREATININE 1.61*  CALCIUM 9.1   Liver Function Tests: No results for input(s): "  AST", "ALT", "ALKPHOS", "BILITOT", "PROT", "ALBUMIN" in the last 72 hours. No results for input(s): "LIPASE", "AMYLASE" in the last 72 hours. CBC: Recent Labs    10/03/21 2217  WBC 7.6  HGB 13.2  HCT 38.7*  MCV 87.6  PLT 201   Cardiac Enzymes: Recent Labs    10/03/21 2217 10/04/21 0111  TROPONINIHS 88* 727*   BNP: Invalid input(s): "POCBNP" D-Dimer: No results for input(s): "DDIMER" in the last 72 hours. Hemoglobin A1C: No results for input(s): "HGBA1C" in the last 72 hours. Fasting Lipid Panel: No results for input(s): "CHOL", "HDL", "LDLCALC", "TRIG", "CHOLHDL", "LDLDIRECT" in the last 72 hours. Thyroid Function Tests: No results for input(s): "TSH", "T4TOTAL", "T3FREE", "THYROIDAB" in the last 72 hours.  Invalid input(s): "FREET3" Anemia Panel: No results for input(s): "VITAMINB12", "FOLATE", "FERRITIN", "TIBC", "IRON", "RETICCTPCT" in the last 72 hours.  DG Chest 2 View  Result Date: 10/03/2021 CLINICAL DATA:  Chest pain EXAM: CHEST - 2 VIEW COMPARISON:  01/16/2021 FINDINGS: Heart is normal size. Left lung clear. Right base atelectasis with small right pleural effusion. No acute bony abnormality. Scattered aortic atherosclerosis. IMPRESSION: Small right pleural effusion with right base atelectasis. Electronically Signed   By: Rolm Baptise  M.D.   On: 10/03/2021 23:05     Radiology: DG Chest 2 View  Result Date: 10/03/2021 CLINICAL DATA:  Chest pain EXAM: CHEST - 2 VIEW COMPARISON:  01/16/2021 FINDINGS: Heart is normal size. Left lung clear. Right base atelectasis with small right pleural effusion. No acute bony abnormality. Scattered aortic atherosclerosis. IMPRESSION: Small right pleural effusion with right base atelectasis. Electronically Signed   By: Rolm Baptise M.D.   On: 10/03/2021 23:05    ECHO 11/14/2020 ECHOCARDIOGRAPHIC MEASUREMENTS  2D DIMENSIONS  AORTA                  Values   Normal Range   MAIN PA         Values    Normal Range                Annulus: nm*          [2.3-2.9]         PA Main: nm*       [1.5-2.1]              Aorta Sin: 4.1 cm       [3.1-3.7]    RIGHT VENTRICLE            ST Junction: nm*          [2.6-3.2]         RV Base: 4.8 cm    [< 4.2]              Asc.Aorta: 3.4 cm       [2.6-3.4]          RV Mid: nm*       [<3.5]  LEFT VENTRICLE                                      RV Length: nm*       [<8.6]                  LVIDd: 6.5 cm       [4.2-5.9]    INFERIOR VENA CAVA                  LVIDs: 4.8 cm  Max. IVC: nm*       [<=2.1]                     FS: 26.1 %       [>25]            Min. IVC: nm*                    SWT: 1.1 cm       [0.6-1.0]    ------------------                    PWT: 1.1 cm       [0.6-1.0]    nm* - not measured  LEFT ATRIUM                LA Diam: 4.9 cm       [3.0-4.0]            LA A4C Area: nm*          [<20]              LA Volume: nm*          [18-58]  _________________________________________________________________________________________  ECHOCARDIOGRAPHIC DESCRIPTIONS  AORTIC ROOT                   Size: Normal             Dissection: INDETERM FOR DISSECTION  AORTIC VALVE               Leaflets: Tricuspid                   Morphology: MILDLY THICKENED               Mobility: Fully mobile  LEFT VENTRICLE                   Size: MILDLY ENLARGED                Anterior: HYPOCONTRACTILE            Contraction: REGIONALLY IMPAIRED            Lateral: HYPOCONTRACTILE             Closest EF: 40% (Estimated)                 Septal: HYPOCONTRACTILE              LV Masses: No Masses                       Apical: HYPOCONTRACTILE                    LVH: None                          Inferior: Normal                                                      Posterior: Normal           Dias.FxClass: (Grade 1) relaxation abnormal, E/A reversal  MITRAL VALVE               Leaflets: Normal  Mobility: Fully mobile             Morphology: Normal  LEFT ATRIUM                   Size: MODERATELY ENLARGED          LA Masses: No masses              IA Septum: Normal IAS  MAIN PA                   Size: Normal  PULMONIC VALVE             Morphology: Normal                        Mobility: Fully mobile  RIGHT VENTRICLE                   Size: MILDLY ENLARGED              Free Wall: Normal            Contraction: Normal                       RV Masses: No Masses  TRICUSPID VALVE               Leaflets: Normal                        Mobility: Fully mobile             Morphology: Normal  RIGHT ATRIUM                   Size: MODERATELY ENLARGED           RA Other: None                RA Mass: No masses  PERICARDIUM                  Fluid: No effusion  INFERIOR VENACAVA                   Size: Normal Normal respiratory collapse  _________________________________________________________________________________________   DOPPLER ECHO and OTHER SPECIAL PROCEDURES                 Aortic: No AR                      No AS                         118.3 cm/sec peak vel      5.6 mmHg peak grad                         2.8 mmHg mean grad         2.8 cm^2 by DOPPLER                 Mitral: MODERATE MR                No MS                         MV Inflow E Vel = 61.3 cm/sec      MV Annulus E'Vel = nm*  E/E'Ratio = nm*               Tricuspid: MODERATE TR                No TS                         373.3 cm/sec peak TR vel   58.7 mmHg peak RV pressure              Pulmonary: TRIVIAL PR                 No PS  _________________________________________________________________________________________  INTERPRETATION  MILD-TO-MODERATE SEGMENTAL LV SYSTOLIC DYSFUNCTION WITH AN ESTIMATED EF = 35-40 %  NORMAL RIGHT VENTRICULAR SYSTOLIC FUNCTION  MILD-TO-MODERATE TRICUSPID AND MITRAL VALVE INSUFFICIENCY  NO VALVULAR STENOSIS  MODERATE BIATRIAL ENLARGEMENT  MILD LV ENLARGEMENT  MILD RV ENLARGEMENT  MILDLY DILATED AORTIC ROOT MEASURING UP TO 4.1 CM  APICAL ANTERIOR, AND SEPTAL WALL HYPOKINESIS   TELEMETRY reviewed by me (LT) 10/04/2021 : Sinus rhythm frequent PACs and PVCs with rate 60s  EKG reviewed by me: NSR anterolateral TWI overall similar to prior form 12/2020  Data reviewed by me (LT) 10/04/2021: admission H&P, ED note, nursing notes, CBC, BMP, troponins, ordered tropnins BNP, cxr,   ASSESSMENT AND PLAN:  Shawn Andrews is a 25yoM with a PMH of CAD ([place] 12/2020 with 80% distal RCA, 50% prox RCA, 55% ost-prox Lcx, and 40% or less RPDA and LAD - medically managed), HFrEF (EF 35-40%, G1 DD, anterior, lateral, septal, apical hypo 10/2020), HTN, HLD, CKD 3, BPH, essential tremor and urinary retention with suprapubic catheter since 01/2021 who presented to [facility] ED the early morning hours of 10/04/2021 with "indigestion" and has an uptrending troponin from initially 88-1,100 for which cardiology is consulted for further assistance.  #NSTEMI #CAD (80% distal RCA, 50% proximal RCA, 55% ostial-prox LCx) Presents after eating a hot dog, potato salad, and coconut pie at a church supper which were somewhat unusual foods for him.  His main symptom was "indigestion" and a burning/hurting in the center of his chest with a sensation of needing to burp that lasted about 2 hours, unrelieved by Pepcid, warm Coca-Cola, or  Alka-Seltzer initially but eventually was self resolving in the ED.  He has overall been feeling well, exercising for an hour at a time at the [facility] 3 times weekly, most recently the morning of 9/13 without any sort of anginal equivalent and denies having symptoms of this quality or intensity in the past.  His troponin is elevated and uptrending with a current peak of 1100, but EKG looks overall similar, if not slightly improved compared to prior in December when he had his most recent LHC.  Since his symptoms have resolved entirely thus far and he feels overall well, he and his wife strongly prefer conservative medical management rather than pursuing another heart catheterization at this time, which is certainly reasonable. -Trend troponin until peak -S/p 325 mg aspirin, continue DAPT with 81 mg aspirin and Plavix 75 mg daily -Continue heparin drip for 48 hours (ending 9/16 at 0230) -Continue metoprolol 25 mg twice daily -Increase isosorbide from 30 mg to 60 mg once daily -Increase Crestor from 10 mg to 20 mg (myalgias with high dose atorvastatin) -Repeat echocardiogram complete  #HFrEF (EF 35-40%, G1 DD 10/2020) Appears compensated, on room air.  Chest x-ray with a small right pleural effusion/atelectasis.  Will check a BNP and repeat echo. -Continue GDMT  with metoprolol, Lasix 20 mg every other day (he is due for this today 9/14) defer lisinopril due to nagging cough, can consider low-dose losartan as his blood pressure allows.  #CKD 3 Baseline from earlier this year appears to be Cr. 1.5-1.7 and GFR 38-40). On admission creatinine 1.61 and GFR 40, actually improving to 1.33 and 50 today.   This patient's plan of care was discussed and created with Dr. Saralyn Pilar and he is in agreement.  Signed: Tristan Schroeder , PA-C 10/04/2021, 8:04 AM [facility] Cardiology

## 2021-10-04 NOTE — ED Provider Notes (Signed)
[facility] Provider Note    Event Date/Time   First MD Initiated Contact with Patient 10/04/21 0101     (approximate)   History   Chest Pain   HPI  Shawn Andrews is a 88 y.o. male who presents to the ED for evaluation of Chest Pain   I reviewed cardiology clinic visit from May.  History of CAD and CHF currently undergoing cardiac rehab.  On Plavix.  Last LHC was 12/2020, and showed moderately severe multifocal disease.  Patient reports having another cath 1 year ago, 09/2020, at an outside hospital and had a stent placed  He presents to the ED after a brief episode of since resolved "indigestion."  He reports around 9 PM, about 2 hours after eating dinner, he developed the substernal sensation of burning that he describes as indigestion.  Not improved with H2 blockers that he typically takes.  Last about 30 minutes before self resolving and has not recurred.  Feels fine now.  Denies any increased lower extremity edema, dyspnea, cough, fever, emesis, syncope or dizziness.   Physical Exam   Triage Vital Signs: ED Triage Vitals  Enc Vitals Group     BP 10/03/21 2212 (!) 167/96     Pulse Rate 10/03/21 2212 88     Resp 10/03/21 2212 17     Temp 10/03/21 2212 (!) 97.5 F (36.4 C)     Temp Source 10/03/21 2212 Oral     SpO2 10/03/21 2212 95 %     Weight 10/03/21 2213 185 lb 3 oz (84 kg)     Height 10/03/21 2213 '5\' 10"'$  (1.778 m)     Head Circumference --      Peak Flow --      Pain Score --      Pain Loc --      Pain Edu? --      Excl. in [place]? --     Most recent vital signs: Vitals:   10/03/21 2212 10/04/21 0112  BP: (!) 167/96 (!) 154/63  Pulse: 88 71  Resp: 17 19  Temp: (!) 97.5 F (36.4 C)   SpO2: 95% 97%    General: Awake, no distress.  Looks systemically well, pleasant and conversational. CV:  Good peripheral perfusion.  Resp:  Normal effort.  Abd:  No distention.  MSK:  No deformity noted.  Minimal sock lines, no other pitting  edema. Neuro:  No focal deficits appreciated. Other:     ED Results / Procedures / Treatments   Labs (all labs ordered are listed, but only abnormal results are displayed) Labs Reviewed  BASIC METABOLIC PANEL - Abnormal; Notable for the following components:      Result Value   Glucose, Bld 153 (*)    BUN 31 (*)    Creatinine, Ser 1.61 (*)    GFR, Estimated 40 (*)    All other components within normal limits  CBC - Abnormal; Notable for the following components:   HCT 38.7 (*)    All other components within normal limits  TROPONIN I (HIGH SENSITIVITY) - Abnormal; Notable for the following components:   Troponin I (High Sensitivity) 88 (*)    All other components within normal limits  TROPONIN I (HIGH SENSITIVITY) - Abnormal; Notable for the following components:   Troponin I (High Sensitivity) 727 (*)    All other components within normal limits  APTT  PROTIME-INR  HEPARIN LEVEL (UNFRACTIONATED)    EKG Sinus rhythm with a rate of  89 bpm.  Normal axis and intervals.  Nonspecific ST changes anteriorly.  aVL and lead I with flipped T waves.  No clear STEMI. Comparison from December has essentially same morphology  RADIOLOGY 2 view CXR interpreted by me with tiny right pleural effusion without infiltrate.  Official radiology report(s): DG Chest 2 View  Result Date: 10/03/2021 CLINICAL DATA:  Chest pain EXAM: CHEST - 2 VIEW COMPARISON:  01/16/2021 FINDINGS: Heart is normal size. Left lung clear. Right base atelectasis with small right pleural effusion. No acute bony abnormality. Scattered aortic atherosclerosis. IMPRESSION: Small right pleural effusion with right base atelectasis. Electronically Signed   By: Rolm Baptise M.D.   On: 10/03/2021 23:05    PROCEDURES and INTERVENTIONS:  .1-3 Lead EKG Interpretation  Performed by: Vladimir Crofts, MD Authorized by: Vladimir Crofts, MD     Interpretation: normal     ECG rate:  70   ECG rate assessment: normal     Rhythm: sinus  rhythm     Ectopy: none     Conduction: normal   .Critical Care  Performed by: Vladimir Crofts, MD Authorized by: Vladimir Crofts, MD   Critical care provider statement:    Critical care time (minutes):  30   Critical care time was exclusive of:  Separately billable procedures and treating other patients   Critical care was necessary to treat or prevent imminent or life-threatening deterioration of the following conditions:  Cardiac failure   Critical care was time spent personally by me on the following activities:  Development of treatment plan with patient or surrogate, discussions with consultants, evaluation of patient's response to treatment, examination of patient, ordering and review of laboratory studies, ordering and review of radiographic studies, ordering and performing treatments and interventions, pulse oximetry, re-evaluation of patient's condition and review of old charts   Medications  heparin ADULT infusion 100 units/mL (25000 units/266m) (1,100 Units/hr Intravenous New Bag/Given 10/04/21 0224)  aspirin chewable tablet 324 mg (324 mg Oral Given 10/04/21 0222)  heparin bolus via infusion 4,000 Units (4,000 Units Intravenous Bolus from Bag 10/04/21 0224)     IMPRESSION / MDM / [place]/ ED COURSE  I reviewed the triage vital signs and the nursing notes.  Differential diagnosis includes, but is not limited to, ACS, PTX, PNA, muscle strain/spasm, PE, dissection  {Patient presents with symptoms of an acute illness or injury that is potentially life-threatening.  Pleasant 88year old male with known CAD presents after a brief episode of "indigestion" with evidence of an NSTEMI requiring medical admission.  No current chest pain.  EKG with similar morphology as previous without STEMI.  He continues to be pain-free.  Troponins bump on repeat to the 700s.  Otherwise has CKD around baseline, no hematologic derangements on CBC.  CXR is clear.  He is provided aspirin, started on  heparin and we will consult medicine for admission.  Clinical Course as of 10/04/21 0310  Thu Oct 04, 2021  0207 Reassessed and confirmed that he does not have any current pain.  Discussed troponin.  Discussed NSTEMI, medical admission and plan of care.  He is in agreement. [DS]    Clinical Course User Index [DS] SVladimir Crofts MD     FINAL CLINICAL IMPRESSION(S) / ED DIAGNOSES   Final diagnoses:  NSTEMI (non-ST elevated myocardial infarction) ([place]  Chest pain due to myocardial ischemia, unspecified ischemic chest pain type     Rx / DC Orders   ED Discharge Orders     None  Note:  This document was prepared using Dragon voice recognition software and may include unintentional dictation errors.   Vladimir Crofts, MD 10/04/21 [phone]

## 2021-10-04 NOTE — Progress Notes (Signed)
ANTICOAGULATION CONSULT NOTE -   Pharmacy Consult for Heparin  Indication: chest pain/ACS  Allergies  Allergen Reactions   Penicillins Swelling    Of tongue Has patient had a PCN reaction causing immediate rash, facial/tongue/throat swelling, SOB or lightheadedness with hypotension: Yes Has patient had a PCN reaction causing severe rash involving mucus membranes or skin necrosis: No Has patient had a PCN reaction that required hospitalization: No Has patient had a PCN reaction occurring within the last 10 years: No If all of the above answers are "NO", then may proceed with Cephalosporin use.   Cialis [Tadalafil] Other (See Comments)    Indigestion and "felt like crap"   Lipitor [Atorvastatin] Other (See Comments)    Severe myalgias   Lisinopril Cough    Dry nagging cough    Patient Measurements: Height: '5\' 10"'$  (177.8 cm) Weight: 84 kg (185 lb 3 oz) IBW/kg (Calculated) : 73 Heparin Dosing Weight: 84 kg   Vital Signs: Temp: 98.4 F (36.9 C) (09/14 1956) Temp Source: Oral (09/14 1956) BP: 127/61 (09/14 1956) Pulse Rate: 61 (09/14 1956)  Labs: Recent Labs    10/03/21 2217 10/04/21 0111 10/04/21 0219 10/04/21 0922 10/04/21 1019 10/04/21 1211 10/04/21 1913  HGB 13.2  --   --  11.8*  --   --   --   HCT 38.7*  --   --  34.4*  --   --   --   PLT 201  --   --  154  --   --   --   APTT  --   --  27  --   --   --   --   LABPROT  --   --  14.9  --   --   --   --   INR  --   --  1.2  --   --   --   --   HEPARINUNFRC  --   --   --   --  0.38  --  0.23*  CREATININE 1.61*  --   --  1.33*  --   --   --   TROPONINIHS 88* 727*  --  1,147*  --  994*  --      Estimated Creatinine Clearance: 37.4 mL/min (A) (by C-G formula based on SCr of 1.33 mg/dL (H)).   Medical History: Past Medical History:  Diagnosis Date   Abnormal drug screen 06/22/2014   inapprop neg xanax rpt 3 mo (06/2014)   BPH (benign prostatic hypertrophy) 01/21/1998   has had 3 biopsies in past ([facility])  decided to stop PSA/DRE   CHF (congestive heart failure) (H[facility])    Coronary artery disease    Hyperlipidemia 01/21/2002   Hypertension 05/22/2003   Left lumbar radiculopathy    Osteoarthritis 01/22/1988   knees, lumbar spondylosis and listhesis    Medications:  Medications Prior to Admission  Medication Sig Dispense Refill Last Dose   acetaminophen (TYLENOL 8 HOUR) 650 MG CR tablet Take 1 tablet (650 mg total) by mouth 2 (two) times a day.   10/03/2021   aspirin 81 MG chewable tablet Chew 1 tablet by mouth daily.   10/03/2021   Cholecalciferol (VITAMIN D3) 25 MCG (1000 UT) CAPS Take 1 capsule (1,000 Units total) by mouth daily. 30 capsule  10/03/2021   clopidogrel (PLAVIX) 75 MG tablet Take 1 tablet by mouth daily.   10/03/2021   cyanocobalamin 1000 MCG tablet Take 1,000 mcg by mouth daily.   10/03/2021   docusate  sodium (COLACE) 100 MG capsule Take 1 capsule (100 mg total) by mouth daily.   10/03/2021   doxazosin (CARDURA) 4 MG tablet TAKE 1 TABLET BY MOUTH  DAILY 90 tablet 3 10/03/2021   famotidine (PEPCID) 20 MG tablet TAKE 1 TABLET BY MOUTH AT  BEDTIME 90 tablet 3 10/03/2021   finasteride (PROSCAR) 5 MG tablet TAKE 1 TABLET BY MOUTH  DAILY 90 tablet 3 10/03/2021   furosemide (LASIX) 20 MG tablet Take 1 tablet by mouth every other day.   Past Week   isosorbide mononitrate (IMDUR) 30 MG 24 hr tablet Take 1 tablet by mouth daily.   10/03/2021   metoprolol tartrate (LOPRESSOR) 50 MG tablet Take 0.5 tablets (25 mg total) by mouth 2 (two) times daily.   10/03/2021   primidone (MYSOLINE) 50 MG tablet Take 2 tablets (100 mg total) by mouth 3 (three) times daily.   10/03/2021   rosuvastatin (CRESTOR) 5 MG tablet Take 10 mg by mouth daily.   10/03/2021   atorvastatin (LIPITOR) 40 MG tablet Take 1 tablet by mouth at bedtime. (Patient not taking: Reported on 10/04/2021)   Not Taking   Coenzyme Q10 (CO Q-10) 100 MG CAPS Take 1 capsule by mouth daily. (Patient not taking: Reported on 02/25/2021)  0 Not Taking    loratadine (CLARITIN) 10 MG tablet Take 1 tablet (10 mg total) by mouth daily as needed for allergies. 30 tablet  prn at prn   nitroGLYCERIN (NITROSTAT) 0.4 MG SL tablet Place under the tongue.   prn at prn   polyethylene glycol powder (GLYCOLAX/MIRALAX) 17 GM/SCOOP powder Take 8.5-17 g by mouth daily as needed for moderate constipation. 3350 g 0 prn    Assessment: Pharmacy consulted to dose heparin in this 88 year old male admitted with ACS/NSTEMI.  No prior anticoag noted. Trops trending up.   9/14 1019 HL 0.38 9/14 1913 HL 0.23  Goal of Therapy:  Heparin level 0.3-0.7 units/ml Monitor platelets by anticoagulation protocol: Yes   Plan:  9/14 1913 HL 0.23 Heparin level is subtherapeutic.  Will order bolus of 1200 units and increase heparin infusion at 1300 units/hr. Recheck HL in 8 hours. CBC daily while on heparin. Plan to continue heparin infusion for 48 hours.   Sameer Teeple A, PharmD 10/04/2021,8:00 PM

## 2021-10-04 NOTE — Assessment & Plan Note (Signed)
Last changed 9/6

## 2021-10-04 NOTE — Hospital Course (Signed)
88 year old male with past medical history of CAD status post MI a year ago status post stents as well as systolic heart failure with ejection fraction of 40%, urinary retention with suprapubic catheter and hypertension presented to the emergency room on the night of 9/13 with complaints of chest pain .  Initial troponin of 88 and then started to trend back up and patient felt to have a non-STEMI.  Cardiology consulted and with EKG actually noting improvement from December, patient and cardiology prefer medical management.  Plan is for patient to be on heparin for 48 hours with adjustments in medication and echocardiogram.

## 2021-10-04 NOTE — Assessment & Plan Note (Signed)
Continue metoprolol. 

## 2021-10-04 NOTE — Assessment & Plan Note (Signed)
Continue primidone.  Followed by neurology 

## 2021-10-04 NOTE — ED Notes (Signed)
Pt brought to ED rm 15 at this time, this RN now assuming care.

## 2021-10-04 NOTE — Progress Notes (Signed)
ANTICOAGULATION CONSULT NOTE - Initial Consult  Pharmacy Consult for Heparin  Indication: chest pain/ACS  Allergies  Allergen Reactions   Penicillins Swelling    Of tongue Has patient had a PCN reaction causing immediate rash, facial/tongue/throat swelling, SOB or lightheadedness with hypotension: Yes Has patient had a PCN reaction causing severe rash involving mucus membranes or skin necrosis: No Has patient had a PCN reaction that required hospitalization: No Has patient had a PCN reaction occurring within the last 10 years: No If all of the above answers are "NO", then may proceed with Cephalosporin use.   Cialis [Tadalafil] Other (See Comments)    Indigestion and "felt like crap"   Lipitor [Atorvastatin] Other (See Comments)    Severe myalgias   Lisinopril Cough    Dry nagging cough    Patient Measurements: Height: '5\' 10"'$  (177.8 cm) Weight: 84 kg (185 lb 3 oz) IBW/kg (Calculated) : 73 Heparin Dosing Weight: 84 kg   Vital Signs: Temp: 97.5 F (36.4 C) (09/13 2212) Temp Source: Oral (09/13 2212) BP: 154/63 (09/14 0112) Pulse Rate: 71 (09/14 0112)  Labs: Recent Labs    10/03/21 2217 10/04/21 0111  HGB 13.2  --   HCT 38.7*  --   PLT 201  --   CREATININE 1.61*  --   TROPONINIHS 88* 727*    Estimated Creatinine Clearance: 30.9 mL/min (A) (by C-G formula based on SCr of 1.61 mg/dL (H)).   Medical History: Past Medical History:  Diagnosis Date   Abnormal drug screen 06/22/2014   inapprop neg xanax rpt 3 mo (06/2014)   BPH (benign prostatic hypertrophy) 01/21/1998   has had 3 biopsies in past ([facility]) decided to stop PSA/DRE   CHF (congestive heart failure) (HCC)    Coronary artery disease    Hyperlipidemia 01/21/2002   Hypertension 05/22/2003   Left lumbar radiculopathy    Osteoarthritis 01/22/1988   knees, lumbar spondylosis and listhesis    Medications:  (Not in a hospital admission)   Assessment: Pharmacy consulted to dose heparin in this 88 year  old male admitted with ACS/NSTEMI.  No prior anticoag noted.  CrCl = 30.9 ml/min  Goal of Therapy:  Heparin level 0.3-0.7 units/ml Monitor platelets by anticoagulation protocol: Yes   Plan:  Give 4000 units bolus x 1 Start heparin infusion at 1100 units/hr Check anti-Xa level in 8 hours and daily while on heparin Continue to monitor H&H and platelets  Nakaiya Beddow D 10/04/2021,2:17 AM

## 2021-10-04 NOTE — Assessment & Plan Note (Signed)
Elevated BNP of almost 1900.  Echocardiogram done 9/14 notes ejection fraction of 30 to 35%.  Have started IV Lasix.

## 2021-10-04 NOTE — Assessment & Plan Note (Signed)
Continue Proscar and Cardura

## 2021-10-04 NOTE — Assessment & Plan Note (Deleted)
Elevated BNP of almost 1900.  Echocardiogram done 9/14 notes ejection fraction of 30 to 35%.  Have started IV Lasix.

## 2021-10-04 NOTE — Assessment & Plan Note (Signed)
Meets criteria with BMI greater than 25 

## 2021-10-04 NOTE — Progress Notes (Signed)
*  PRELIMINARY RESULTS* Echocardiogram 2D Echocardiogram has been performed.  Sherrie Sport 10/04/2021, 10:58 AM

## 2021-10-04 NOTE — Progress Notes (Signed)
ANTICOAGULATION CONSULT NOTE - Initial Consult  Pharmacy Consult for Heparin  Indication: chest pain/ACS  Allergies  Allergen Reactions   Penicillins Swelling    Of tongue Has patient had a PCN reaction causing immediate rash, facial/tongue/throat swelling, SOB or lightheadedness with hypotension: Yes Has patient had a PCN reaction causing severe rash involving mucus membranes or skin necrosis: No Has patient had a PCN reaction that required hospitalization: No Has patient had a PCN reaction occurring within the last 10 years: No If all of the above answers are "NO", then may proceed with Cephalosporin use.   Cialis [Tadalafil] Other (See Comments)    Indigestion and "felt like crap"   Lipitor [Atorvastatin] Other (See Comments)    Severe myalgias   Lisinopril Cough    Dry nagging cough    Patient Measurements: Height: '5\' 10"'$  (177.8 cm) Weight: 84 kg (185 lb 3 oz) IBW/kg (Calculated) : 73 Heparin Dosing Weight: 84 kg   Vital Signs: Temp: 98.2 F (36.8 C) (09/14 1020) Temp Source: Oral (09/14 1020) BP: 111/67 (09/14 1030) Pulse Rate: 60 (09/14 1030)  Labs: Recent Labs    10/03/21 2217 10/04/21 0111 10/04/21 0219 10/04/21 0922 10/04/21 1019  HGB 13.2  --   --  11.8*  --   HCT 38.7*  --   --  34.4*  --   PLT 201  --   --  154  --   APTT  --   --  27  --   --   LABPROT  --   --  14.9  --   --   INR  --   --  1.2  --   --   HEPARINUNFRC  --   --   --   --  0.38  CREATININE 1.61*  --   --  1.33*  --   TROPONINIHS 88* 727*  --  1,147*  --      Estimated Creatinine Clearance: 37.4 mL/min (A) (by C-G formula based on SCr of 1.33 mg/dL (H)).   Medical History: Past Medical History:  Diagnosis Date   Abnormal drug screen 06/22/2014   inapprop neg xanax rpt 3 mo (06/2014)   BPH (benign prostatic hypertrophy) 01/21/1998   has had 3 biopsies in past ([facility]) decided to stop PSA/DRE   CHF (congestive heart failure) ([facility]CC)    Coronary artery disease     Hyperlipidemia 01/21/2002   Hypertension 05/22/2003   Left lumbar radiculopathy    Osteoarthritis 01/22/1988   knees, lumbar spondylosis and listhesis    Medications:  (Not in a hospital admission)  Assessment: Pharmacy consulted to dose heparin in this 88 year old male admitted with ACS/NSTEMI.  No prior anticoag noted. Trops trending up.   9/14 1019 HL 0.38  Goal of Therapy:  Heparin level 0.3-0.7 units/ml Monitor platelets by anticoagulation protocol: Yes   Plan:  Heparin level is therapeutic. Will continue heparin infusion at 1100 units/hr. Recheck HL in 8 hours. CBC daily while on heparin. Plan to continue heparin infusion for 48 hours.   Oswald Hillock, PharmD, BCPS 10/04/2021,11:52 AM

## 2021-10-04 NOTE — H&P (Signed)
History and Physical    Patient: Shawn Andrews ASN:[id number] DOB: 09-24-1928 DOA: 10/04/2021 DOS: the patient was seen and examined on 10/04/2021 PCP: Leonel Ramsay, MD  Patient coming from: Home  Chief Complaint:  Chief Complaint  Patient presents with   Chest Pain    HPI: Shawn Andrews is a 88 y.o. male with medical history significant for CAD s/p MI in Sept 2022 with stents placed, HFrEF, BPH, HTN, HLD, essential tremor and urinary retention with suprapubic catheter since January 2023 last changed 09/26/2021, who presents to the ED with substernal chest pain that started a couple hours after dinner not improved with H2 blockers which she took thinking it was indigestion.  It resolved after 30 minutes but then recurred.  He denied associated palpitations, shortness of breath, lightheadedness, nausea vomiting or diaphoresis. ED course and data review:BP 167/96 with otherwise normal vitals.  Labs: Troponin 88-7 27.  Creatinine at baseline at 1.61.  Labs otherwise unremarkable.  EKG personally viewed and interpreted showing NSR at 89 with no acute ST-T wave changes.  Chest x-ray showing small right pleural effusion. Patient given chewable aspirin 324 mg started on a heparin infusion and hospitalist consulted for admission.   Review of Systems: As mentioned in the history of present illness. All other systems reviewed and are negative.  Past Medical History:  Diagnosis Date   Abnormal drug screen 06/22/2014   inapprop neg xanax rpt 3 mo (06/2014)   BPH (benign prostatic hypertrophy) 01/21/1998   has had 3 biopsies in past ([facility]) decided to stop PSA/DRE   CHF (congestive heart failure) (H[facility])    Coronary artery disease    Hyperlipidemia 01/21/2002   Hypertension 05/22/2003   Left lumbar radiculopathy    Osteoarthritis 01/22/1988   knees, lumbar spondylosis and listhesis   Past Surgical History:  Procedure Laterality Date   CATARACT EXTRACTION  2013   bilateral   ESI Left  08/2014, 11/2014, 08/2015   L S1, L L5/S1 transforaminal ESI; L4/5 L5/S1 zygapophysial injections, L S1 transforaminal ([facility])   FINGER SURGERY     right middle, partial traumatic amputation   IR CATHETER TUBE CHANGE  03/02/2021   KNEE ARTHROSCOPY  1990   right   KNEE SURGERY  04/2006   right partial knee replacement in [place] - rec ppx abx for any invasive procedure   LEFT HEART CATH AND CORONARY ANGIOGRAPHY N/A 01/18/2021   Procedure: LEFT HEART CATH AND CORONARY ANGIOGRAPHY;  Surgeon: Corey Skains, MD;  Location: [facility];  Service: Cardiovascular;  Laterality: N/A;   Social History:  reports that he quit smoking about 20 years ago. His smoking use included pipe and cigarettes. His smokeless tobacco use includes chew. He reports current alcohol use. He reports that he does not use drugs.  Allergies  Allergen Reactions   Penicillins Swelling    Of tongue Has patient had a PCN reaction causing immediate rash, facial/tongue/throat swelling, SOB or lightheadedness with hypotension: Yes Has patient had a PCN reaction causing severe rash involving mucus membranes or skin necrosis: No Has patient had a PCN reaction that required hospitalization: No Has patient had a PCN reaction occurring within the last 10 years: No If all of the above answers are "NO", then may proceed with Cephalosporin use.   Cialis [Tadalafil] Other (See Comments)    Indigestion and "felt like crap"   Lipitor [Atorvastatin] Other (See Comments)    Severe myalgias   Lisinopril Cough    Dry nagging cough  Family History  Problem Relation Age of Onset   Hypertension Mother    Heart disease Brother 82       MI   Diabetes Brother    Cancer Daughter        melanoma   Stroke Neg Hx     Prior to Admission medications   Medication Sig Start Date End Date Taking? Authorizing Provider  acetaminophen (TYLENOL 8 HOUR) 650 MG CR tablet Take 1 tablet (650 mg total) by mouth 2 (two) times a day. 06/24/18   Yes Ria Bush, MD  aspirin 81 MG chewable tablet Chew 1 tablet by mouth daily.   Yes [provider]  Cholecalciferol (VITAMIN D3) 25 MCG (1000 UT) CAPS Take 1 capsule (1,000 Units total) by mouth daily. 08/25/18  Yes Ria Bush, MD  clopidogrel (PLAVIX) 75 MG tablet Take 1 tablet by mouth daily. 10/10/20  Yes [provider]  cyanocobalamin 1000 MCG tablet Take 1,000 mcg by mouth daily.   Yes [provider]  docusate sodium (COLACE) 100 MG capsule Take 1 capsule (100 mg total) by mouth daily. 12/13/20  Yes Ria Bush, MD  doxazosin (CARDURA) 4 MG tablet TAKE 1 TABLET BY MOUTH  DAILY 11/17/19  Yes Ria Bush, MD  famotidine (PEPCID) 20 MG tablet TAKE 1 TABLET BY MOUTH AT  BEDTIME 01/23/21  Yes Ria Bush, MD  finasteride (PROSCAR) 5 MG tablet TAKE 1 TABLET BY MOUTH  DAILY 11/17/19  Yes Ria Bush, MD  furosemide (LASIX) 20 MG tablet Take 1 tablet by mouth every other day. 10/09/20  Yes [provider]  isosorbide mononitrate (IMDUR) 30 MG 24 hr tablet Take 1 tablet by mouth daily. 10/10/20  Yes [provider]  metoprolol tartrate (LOPRESSOR) 50 MG tablet Take 0.5 tablets (25 mg total) by mouth 2 (two) times daily. 10/16/20  Yes Ria Bush, MD  primidone (MYSOLINE) 50 MG tablet Take 2 tablets (100 mg total) by mouth 3 (three) times daily. 10/16/20  Yes Ria Bush, MD  rosuvastatin (CRESTOR) 5 MG tablet Take 10 mg by mouth daily. 12/21/20 12/21/21 Yes [provider]  atorvastatin (LIPITOR) 40 MG tablet Take 1 tablet by mouth at bedtime. Patient not taking: Reported on 10/04/2021 10/09/20   [provider]  Coenzyme Q10 (CO Q-10) 100 MG CAPS Take 1 capsule by mouth daily. Patient not taking: Reported on 02/25/2021 12/21/20   Ria Bush, MD  loratadine (CLARITIN) 10 MG tablet Take 1 tablet (10 mg total) by mouth daily as needed for allergies. 12/22/12   Ria Bush, MD  nitroGLYCERIN  (NITROSTAT) 0.4 MG SL tablet Place under the tongue. 10/09/20   [provider]  polyethylene glycol powder (GLYCOLAX/MIRALAX) 17 GM/SCOOP powder Take 8.5-17 g by mouth daily as needed for moderate constipation. 12/13/20   Ria Bush, MD    Physical Exam: Vitals:   10/03/21 2213 10/04/21 0112 10/04/21 0200 10/04/21 0300  BP:  (!) 154/63 (!) 147/69 (!) 164/77  Pulse:  71 67 74  Resp:  19 (!) 22 (!) 22  Temp:      TempSrc:      SpO2:  97% 95% 95%  Weight: 84 kg     Height: '5\' 10"'$  (1.778 m)      Physical Exam Vitals and nursing note reviewed.  Constitutional:      General: He is not in acute distress. HENT:     Head: Normocephalic and atraumatic.  Cardiovascular:     Rate and Rhythm: Normal rate and regular rhythm.  Heart sounds: Normal heart sounds.  Pulmonary:     Effort: Pulmonary effort is normal.     Breath sounds: Normal breath sounds.  Abdominal:     Palpations: Abdomen is soft.     Tenderness: There is no abdominal tenderness.  Neurological:     Mental Status: Mental status is at baseline.     Labs on Admission: I have personally reviewed following labs and imaging studies  CBC: Recent Labs  Lab 10/03/21 2217  WBC 7.6  HGB 13.2  HCT 38.7*  MCV 87.6  PLT 299   Basic Metabolic Panel: Recent Labs  Lab 10/03/21 2217  NA 140  K 4.0  CL 105  CO2 24  GLUCOSE 153*  BUN 31*  CREATININE 1.61*  CALCIUM 9.1   GFR: Estimated Creatinine Clearance: 30.9 mL/min (A) (by C-G formula based on SCr of 1.61 mg/dL (H)). Liver Function Tests: No results for input(s): "AST", "ALT", "ALKPHOS", "BILITOT", "PROT", "ALBUMIN" in the last 168 hours. No results for input(s): "LIPASE", "AMYLASE" in the last 168 hours. No results for input(s): "AMMONIA" in the last 168 hours. Coagulation Profile: Recent Labs  Lab 10/04/21 0219  INR 1.2   Cardiac Enzymes: No results for input(s): "CKTOTAL", "CKMB", "CKMBINDEX", "TROPONINI" in the last 168 hours. BNP  (last 3 results) No results for input(s): "PROBNP" in the last 8760 hours. HbA1C: No results for input(s): "HGBA1C" in the last 72 hours. CBG: No results for input(s): "GLUCAP" in the last 168 hours. Lipid Profile: No results for input(s): "CHOL", "HDL", "LDLCALC", "TRIG", "CHOLHDL", "LDLDIRECT" in the last 72 hours. Thyroid Function Tests: No results for input(s): "TSH", "T4TOTAL", "FREET4", "T3FREE", "THYROIDAB" in the last 72 hours. Anemia Panel: No results for input(s): "VITAMINB12", "FOLATE", "FERRITIN", "TIBC", "IRON", "RETICCTPCT" in the last 72 hours. Urine analysis:    Component Value Date/Time   COLORURINE YELLOW 02/25/2021 1156   APPEARANCEUR CLOUDY (A) 02/25/2021 1156   LABSPEC 1.020 02/25/2021 1156   PHURINE 6.0 02/25/2021 1156   GLUCOSEU NEGATIVE 02/25/2021 1156   GLUCOSEU NEGATIVE 12/25/2020 1556   HGBUR LARGE (A) 02/25/2021 1156   BILIRUBINUR NEGATIVE 02/25/2021 1156   BILIRUBINUR negative 07/29/2017 1005   KETONESUR NEGATIVE 02/25/2021 1156   PROTEINUR >300 (A) 02/25/2021 1156   UROBILINOGEN 0.2 12/25/2020 1556   NITRITE NEGATIVE 02/25/2021 1156   LEUKOCYTESUR MODERATE (A) 02/25/2021 1156    Radiological Exams on Admission: DG Chest 2 View  Result Date: 10/03/2021 CLINICAL DATA:  Chest pain EXAM: CHEST - 2 VIEW COMPARISON:  01/16/2021 FINDINGS: Heart is normal size. Left lung clear. Right base atelectasis with small right pleural effusion. No acute bony abnormality. Scattered aortic atherosclerosis. IMPRESSION: Small right pleural effusion with right base atelectasis. Electronically Signed   By: Rolm Baptise M.D.   On: 10/03/2021 23:05     Data Reviewed: Relevant notes from primary care and specialist visits, past discharge summaries as available in EHR, including Care Everywhere. Prior diagnostic testing as pertinent to current admission diagnoses Updated medications and problem lists for reconciliation ED course, including vitals, labs, imaging, treatment  and response to treatment Triage notes, nursing and pharmacy notes and ED provider's notes Notable results as noted in HPI   Assessment and Plan: * NSTEMI (non-ST elevated myocardial infarction) [facility]) Troponin 88>727 New IV heparin -As needed nitroglycerin -Continue aspirin, Plavix, Imdur and metoprolol, crestor Cardiology consult  Suprapubic catheter ([facility]) Last changed 9/6  HFrEF (heart failure with reduced ejection fraction) ([place]) Compensated.  Continue furosemide and metoprolol  CKD (chronic kidney disease)  stage 3, GFR 30-59 ml/min (HCC) Renal function at baseline  Essential tremor Continue primidone.  Followed by neurology  Benign prostatic hyperplasia Continue Proscar and Cardura  Essential hypertension Continue metoprolol        DVT prophylaxis: Heparin infusion  Consults: Cardiology, [facility]  Advance Care Planning:   Code Status: Prior   Family Communication: none  Disposition Plan: Back to previous home environment  Severity of Illness: The appropriate patient status for this patient is INPATIENT. Inpatient status is judged to be reasonable and necessary in order to provide the required intensity of service to ensure the patient's safety. The patient's presenting symptoms, physical exam findings, and initial radiographic and laboratory data in the context of their chronic comorbidities is felt to place them at high risk for further clinical deterioration. Furthermore, it is not anticipated that the patient will be medically stable for discharge from the hospital within 2 midnights of admission.   * I certify that at the point of admission it is my clinical judgment that the patient will require inpatient hospital care spanning beyond 2 midnights from the point of admission due to high intensity of service, high risk for further deterioration and high frequency of surveillance required.*  Author: Athena Masse, MD 10/04/2021 3:35 AM  For on call review  [email or web address].

## 2021-10-04 NOTE — ED Notes (Signed)
ED Provider at bedside. 

## 2021-10-04 NOTE — Assessment & Plan Note (Signed)
Renal function closer to baseline following diuresis.  Creatinine currently at baseline

## 2021-10-04 NOTE — Progress Notes (Signed)
[facility] Progress Note  Patient: Shawn Andrews    HWT:[id number]  DOA: 10/04/2021    Date of Service: the patient was seen and examined on 10/04/2021  Brief hospital course: 88 year old male with past medical history of CAD status post MI a year ago status post stents as well as systolic heart failure with ejection fraction of 40%, urinary retention with suprapubic catheter and hypertension presented to the emergency room on the night of 9/13 with complaints of chest pain .  Initial troponin of 88 and then started to trend back up and patient felt to have a non-STEMI.  Cardiology consulted and with EKG actually noting improvement from December, patient and cardiology prefer medical management.  Plan is for patient to be on heparin for 48 hours with adjustments in medication and echocardiogram.  Assessment and Plan: Assessment and Plan: * NSTEMI (non-ST elevated myocardial infarction) (HCC) Continue 48 hours of IV heparin.  Appreciate cardiology follow-up.  Continue aspirin, Plavix, Imdur and metoprolol, crestor.  Medical management only  Acute on chronic systolic CHF (congestive heart failure) (HCC) Elevated BNP of almost 1900.  Echocardiogram done 9/14 notes ejection fraction of 30 to 35%.  Have started IV Lasix.  Chronic kidney disease, stage 3a (Shawn Andrews) Renal function at baseline  Suprapubic catheter ([name]) Last changed 9/6  Essential hypertension Continue metoprolol  Essential tremor Continue primidone.  Followed by neurology  Benign prostatic hyperplasia Continue Proscar and Cardura  Overweight (BMI 25.0-29.9) Meets criteria with BMI greater than 25       Body mass index is 26.57 kg/m.        Consultants: Cardiology  Procedures: Echocardiogram noting ejection fraction of 30 to 35%  Antimicrobials: None  Code Status: Full code   Subjective: Feeling better, denies any current chest pain  Objective: Vital signs were reviewed and unremarkable. Vitals:    10/04/21 1700 10/04/21 1814  BP: (!) 140/64 133/72  Pulse: 61 68  Resp: (!) 27 18  Temp:  98 F (36.7 C)  SpO2: 97% 98%    Intake/Output Summary (Last 24 hours) at 10/04/2021 1857 Last data filed at 10/04/2021 1814 Gross per 24 hour  Intake 480 ml  Output --  Net 480 ml   Filed Weights   10/03/21 2213  Weight: 84 kg   Body mass index is 26.57 kg/m.  Exam:  General: Alert and oriented x3, no acute distress HEENT: Normocephalic, atraumatic, mucous membranes are moist Cardiovascular: Regular rate and rhythm, S1-S2, 2 out of 6 systolic ejection murmur Respiratory: Clear to auscultation bilaterally Abdomen: Soft, nontender, nondistended, positive bowel sounds Musculoskeletal: No clubbing or cyanosis or edema Skin: No skin breaks, tears or lesions Psychiatry: Appropriate, no evidence of psychoses Neurology: Focal deficits  Data Reviewed: Elevated BNP of 1800  Disposition:  Status is: Inpatient Remains inpatient appropriate because:  -48 hours of heparin -Diuresing for CHF    Anticipated discharge date: 9/17  Family Communication: Left message for family DVT Prophylaxis:   Heparin infusion    Author: Annita Brod ,MD 10/04/2021 6:57 PM  To reach On-call, see care teams to locate the attending and reach out via [email or web address]. Between 7PM-7AM, please contact night-coverage If you still have difficulty reaching the attending provider, please page the [facility] (Director on Call) for [facility] on amion for assistance.

## 2021-10-05 DIAGNOSIS — E663 Overweight: Secondary | ICD-10-CM

## 2021-10-05 DIAGNOSIS — N1832 Chronic kidney disease, stage 3b: Secondary | ICD-10-CM

## 2021-10-05 DIAGNOSIS — I5023 Acute on chronic systolic (congestive) heart failure: Secondary | ICD-10-CM

## 2021-10-05 LAB — CBC
HCT: 33.6 % — ABNORMAL LOW (ref 39.0–52.0)
Hemoglobin: 11.6 g/dL — ABNORMAL LOW (ref 13.0–17.0)
MCH: 29.4 pg (ref 26.0–34.0)
MCHC: 34.5 g/dL (ref 30.0–36.0)
MCV: 85.1 fL (ref 80.0–100.0)
Platelets: 164 10*3/uL (ref 150–400)
RBC: 3.95 MIL/uL — ABNORMAL LOW (ref 4.22–5.81)
RDW: 13 % (ref 11.5–15.5)
WBC: 7.6 10*3/uL (ref 4.0–10.5)
nRBC: 0 % (ref 0.0–0.2)

## 2021-10-05 LAB — BASIC METABOLIC PANEL
Anion gap: 6 (ref 5–15)
BUN: 29 mg/dL — ABNORMAL HIGH (ref 8–23)
CO2: 26 mmol/L (ref 22–32)
Calcium: 9.1 mg/dL (ref 8.9–10.3)
Chloride: 105 mmol/L (ref 98–111)
Creatinine, Ser: 1.57 mg/dL — ABNORMAL HIGH (ref 0.61–1.24)
GFR, Estimated: 41 mL/min — ABNORMAL LOW (ref >60)
Glucose, Bld: 114 mg/dL — ABNORMAL HIGH (ref 70–99)
Potassium: 4.4 mmol/L (ref 3.5–5.1)
Sodium: 137 mmol/L (ref 135–145)

## 2021-10-05 LAB — LIPOPROTEIN A (LPA): Lipoprotein (a): 28.3 nmol/L (ref <75.0)

## 2021-10-05 LAB — HEPARIN LEVEL (UNFRACTIONATED)
Heparin Unfractionated: 0.57 IU/mL (ref 0.30–0.70)
Heparin Unfractionated: 0.5 IU/mL (ref 0.30–0.70)

## 2021-10-05 NOTE — Progress Notes (Signed)
[facility] Progress Note  Patient: Shawn Andrews    DXI:[id number]  DOA: 10/04/2021    Date of Service: the patient was seen and examined on 10/05/2021  Brief hospital course: 88 year old male with past medical history of CAD status post MI a year ago status post stents as well as systolic heart failure with ejection fraction of 40%, urinary retention with suprapubic catheter and hypertension presented to the emergency room on the night of 9/13 with complaints of chest pain .  Initial troponin of 88 and then started to trend back up and patient felt to have a non-STEMI.  Cardiology consulted and with EKG actually noting improvement from December, patient and cardiology prefer medical management.  Plan is for patient to be on heparin for 48 hours with adjustments in medication and echocardiogram.  Assessment and Plan: Assessment and Plan: * NSTEMI (non-ST elevated myocardial infarction) ([place]) Continue 48 hours of IV heparin, completes late tonight.  Appreciate cardiology follow-up.  Continue aspirin, Plavix, Imdur and metoprolol, crestor.  Medical management only  Acute on chronic systolic CHF (congestive heart failure) (HCC) Elevated BNP of almost 1900.  Echocardiogram done 9/14 notes ejection fraction of 30 to 35%.  Have started IV Lasix.  Patient has diuresed almost 5 L of fluid  Chronic kidney disease, stage 3b ([place]) Renal function closer to baseline following diuresis.  Creatinine currently 1.57 with GFR 41  Suprapubic catheter (HCC) Last changed 9/6  Essential hypertension Continue metoprolol  Essential tremor Continue primidone.  Followed by neurology  Benign prostatic hyperplasia Continue Proscar and Cardura  Overweight (BMI 25.0-29.9) Meets criteria with BMI greater than 25       Body mass index is 26.57 kg/m.        Consultants: Cardiology  Procedures: Echocardiogram noting ejection fraction of 30 to 35%  Antimicrobials: None  Code Status: Full  code   Subjective: Patient feels good, no complaints  Objective: Vital signs were reviewed and unremarkable. Vitals:   10/05/21 0755 10/05/21 1134  BP: (!) 144/67 (!) 103/59  Pulse: (!) 59 (!) 58  Resp: (!) 22   Temp: 97.9 F (36.6 C) 97.6 F (36.4 C)  SpO2: 97% 98%    Intake/Output Summary (Last 24 hours) at 10/05/2021 1546 Last data filed at 10/05/2021 1403 Gross per 24 hour  Intake 960 ml  Output 4850 ml  Net -3890 ml    Filed Weights   10/03/21 2213  Weight: 84 kg   Body mass index is 26.57 kg/m.  Exam:  General: Alert and oriented x3, no acute distress HEENT: Normocephalic, atraumatic, mucous membranes are moist Cardiovascular: Regular rate and rhythm, S1-S2, 2 out of 6 systolic ejection murmur Respiratory: Clear to auscultation bilaterally Abdomen: Soft, nontender, nondistended, positive bowel sounds Musculoskeletal: No clubbing or cyanosis or edema Skin: No skin breaks, tears or lesions Psychiatry: Appropriate, no evidence of psychoses Neurology: Focal deficits  Data Reviewed: Creatinine of 1.57 with GFR 41  Disposition:  Status is: Inpatient Remains inpatient appropriate because:  -48 hours of heparin -Diuresing for CHF    Anticipated discharge date: 9/16  Family Communication: Wife at the bedside DVT Prophylaxis:   Heparin infusion    Author: Annita Brod ,MD 10/05/2021 3:46 PM  To reach On-call, see care teams to locate the attending and reach out via [email or web address]. Between 7PM-7AM, please contact night-coverage If you still have difficulty reaching the attending provider, please page the [facility] (Director on Call) for [facility] on amion for assistance.

## 2021-10-05 NOTE — Progress Notes (Signed)
ANTICOAGULATION CONSULT NOTE -   Pharmacy Consult for Heparin  Indication: chest pain/ACS  Allergies  Allergen Reactions   Penicillins Swelling    Of tongue Has patient had a PCN reaction causing immediate rash, facial/tongue/throat swelling, SOB or lightheadedness with hypotension: Yes Has patient had a PCN reaction causing severe rash involving mucus membranes or skin necrosis: No Has patient had a PCN reaction that required hospitalization: No Has patient had a PCN reaction occurring within the last 10 years: No If all of the above answers are "NO", then may proceed with Cephalosporin use.   Cialis [Tadalafil] Other (See Comments)    Indigestion and "felt like crap"   Lipitor [Atorvastatin] Other (See Comments)    Severe myalgias   Lisinopril Cough    Dry nagging cough    Patient Measurements: Height: '5\' 10"'$  (177.8 cm) Weight: 84 kg (185 lb 3 oz) IBW/kg (Calculated) : 73 Heparin Dosing Weight: 84 kg   Vital Signs: Temp: 97.6 F (36.4 C) (09/15 1134) Temp Source: Oral (09/15 1134) BP: 103/59 (09/15 1134) Pulse Rate: 58 (09/15 1134)  Labs: Recent Labs    10/03/21 2217 10/04/21 0111 10/04/21 0219 10/04/21 0922 10/04/21 1019 10/04/21 1211 10/04/21 1913 10/05/21 0610 10/05/21 1406  HGB 13.2  --   --  11.8*  --   --   --  11.6*  --   HCT 38.7*  --   --  34.4*  --   --   --  33.6*  --   PLT 201  --   --  154  --   --   --  164  --   APTT  --   --  27  --   --   --   --   --   --   LABPROT  --   --  14.9  --   --   --   --   --   --   INR  --   --  1.2  --   --   --   --   --   --   HEPARINUNFRC  --   --   --   --    < >  --  0.23* 0.57 0.50  CREATININE 1.61*  --   --  1.33*  --   --   --  1.57*  --   TROPONINIHS 88* 727*  --  1,147*  --  994*  --   --   --    < > = values in this interval not displayed.     Estimated Creatinine Clearance: 31.6 mL/min (A) (by C-G formula based on SCr of 1.57 mg/dL (H)).   Medical History: Past Medical History:  Diagnosis  Date   Abnormal drug screen 06/22/2014   inapprop neg xanax rpt 3 mo (06/2014)   BPH (benign prostatic hypertrophy) 01/21/1998   has had 3 biopsies in past ([facility]) decided to stop PSA/DRE   CHF (congestive heart failure) (HCC)    Coronary artery disease    Hyperlipidemia 01/21/2002   Hypertension 05/22/2003   Left lumbar radiculopathy    Osteoarthritis 01/22/1988   knees, lumbar spondylosis and listhesis    Medications:  Medications Prior to Admission  Medication Sig Dispense Refill Last Dose   acetaminophen (TYLENOL 8 HOUR) 650 MG CR tablet Take 1 tablet (650 mg total) by mouth 2 (two) times a day.   10/03/2021   aspirin 81 MG chewable tablet Chew 1 tablet by mouth daily.  10/03/2021   Cholecalciferol (VITAMIN D3) 25 MCG (1000 UT) CAPS Take 1 capsule (1,000 Units total) by mouth daily. 30 capsule  10/03/2021   clopidogrel (PLAVIX) 75 MG tablet Take 1 tablet by mouth daily.   10/03/2021   cyanocobalamin 1000 MCG tablet Take 1,000 mcg by mouth daily.   10/03/2021   docusate sodium (COLACE) 100 MG capsule Take 1 capsule (100 mg total) by mouth daily.   10/03/2021   doxazosin (CARDURA) 4 MG tablet TAKE 1 TABLET BY MOUTH  DAILY 90 tablet 3 10/03/2021   famotidine (PEPCID) 20 MG tablet TAKE 1 TABLET BY MOUTH AT  BEDTIME 90 tablet 3 10/03/2021   finasteride (PROSCAR) 5 MG tablet TAKE 1 TABLET BY MOUTH  DAILY 90 tablet 3 10/03/2021   furosemide (LASIX) 20 MG tablet Take 1 tablet by mouth every other day.   Past Week   isosorbide mononitrate (IMDUR) 30 MG 24 hr tablet Take 1 tablet by mouth daily.   10/03/2021   metoprolol tartrate (LOPRESSOR) 50 MG tablet Take 0.5 tablets (25 mg total) by mouth 2 (two) times daily.   10/03/2021   primidone (MYSOLINE) 50 MG tablet Take 2 tablets (100 mg total) by mouth 3 (three) times daily.   10/03/2021   rosuvastatin (CRESTOR) 5 MG tablet Take 10 mg by mouth daily.   10/03/2021   atorvastatin (LIPITOR) 40 MG tablet Take 1 tablet by mouth at bedtime. (Patient not  taking: Reported on 10/04/2021)   Not Taking   Coenzyme Q10 (CO Q-10) 100 MG CAPS Take 1 capsule by mouth daily. (Patient not taking: Reported on 02/25/2021)  0 Not Taking   loratadine (CLARITIN) 10 MG tablet Take 1 tablet (10 mg total) by mouth daily as needed for allergies. 30 tablet  prn at prn   nitroGLYCERIN (NITROSTAT) 0.4 MG SL tablet Place under the tongue.   prn at prn   polyethylene glycol powder (GLYCOLAX/MIRALAX) 17 GM/SCOOP powder Take 8.5-17 g by mouth daily as needed for moderate constipation. 3350 g 0 prn    Assessment: Pharmacy consulted to dose heparin in this 88 year old male admitted with ACS/NSTEMI.  PMH includes CAD (12/2020), HFrEF (EF 30-35%), HTN, HLD, CKD3, BPH, essential tremor, and urinary retention w/ suprapubic catheter (01/2021). Pt had atypical symptoms of indigestion upon presentation, and cTns were noted to be trending up, from 88 >> 1,147 (peak). Pt denies angina, SOB, or other complaints. New ECHO revealed progression in CHF from HFmrEF to HFrEF with multiple WMAs. 48-hr heparin drip will complete 9/16 @ 0230 at which time it can be discontinued, per cards. Hgb, Hct, PLT all stable.  Date Time HL Rate/Comment  9/14 1019 0.38 Therapeutic x 1; 1100 un/hr 9/14 1913 0.23 SUBtherapeutic; 1100 >> 1300 un/hr 9/15 0610 0.57 Therapeutic x 1; 1300 un/hr 9/15 1406 0.50 Therapeutic x 2  Goal of Therapy:  Heparin level 0.3-0.7 units/ml Monitor platelets by anticoagulation protocol: Yes   Plan:  Continue heparin infusion at 1300 un/hr Stop heparin drip 9/16 @ 0230 No need for additional heparin-related labs before the drip stops  Dara Hoyer, PharmD PGY-1 Pharmacy Resident 10/05/2021 3:03 PM

## 2021-10-05 NOTE — Progress Notes (Signed)
ANTICOAGULATION CONSULT NOTE -   Pharmacy Consult for Heparin  Indication: chest pain/ACS  Allergies  Allergen Reactions   Penicillins Swelling    Of tongue Has patient had a PCN reaction causing immediate rash, facial/tongue/throat swelling, SOB or lightheadedness with hypotension: Yes Has patient had a PCN reaction causing severe rash involving mucus membranes or skin necrosis: No Has patient had a PCN reaction that required hospitalization: No Has patient had a PCN reaction occurring within the last 10 years: No If all of the above answers are "NO", then may proceed with Cephalosporin use.   Cialis [Tadalafil] Other (See Comments)    Indigestion and "felt like crap"   Lipitor [Atorvastatin] Other (See Comments)    Severe myalgias   Lisinopril Cough    Dry nagging cough    Patient Measurements: Height: '5\' 10"'$  (177.8 cm) Weight: 84 kg (185 lb 3 oz) IBW/kg (Calculated) : 73 Heparin Dosing Weight: 84 kg   Vital Signs: Temp: 97.5 F (36.4 C) (09/15 0406) Temp Source: Oral (09/14 1956) BP: 136/71 (09/15 0406) Pulse Rate: 58 (09/15 0406)  Labs: Recent Labs    10/03/21 2217 10/04/21 0111 10/04/21 0219 10/04/21 0922 10/04/21 1019 10/04/21 1211 10/04/21 1913 10/05/21 0610  HGB 13.2  --   --  11.8*  --   --   --  11.6*  HCT 38.7*  --   --  34.4*  --   --   --  33.6*  PLT 201  --   --  154  --   --   --  164  APTT  --   --  27  --   --   --   --   --   LABPROT  --   --  14.9  --   --   --   --   --   INR  --   --  1.2  --   --   --   --   --   HEPARINUNFRC  --   --   --   --  0.38  --  0.23* 0.57  CREATININE 1.61*  --   --  1.33*  --   --   --  1.57*  TROPONINIHS 88* 727*  --  1,147*  --  994*  --   --      Estimated Creatinine Clearance: 31.6 mL/min (A) (by C-G formula based on SCr of 1.57 mg/dL (H)).   Medical History: Past Medical History:  Diagnosis Date   Abnormal drug screen 06/22/2014   inapprop neg xanax rpt 3 mo (06/2014)   BPH (benign prostatic  hypertrophy) 01/21/1998   has had 3 biopsies in past ([facility]) decided to stop PSA/DRE   CHF (congestive heart failure) (HCC)    Coronary artery disease    Hyperlipidemia 01/21/2002   Hypertension 05/22/2003   Left lumbar radiculopathy    Osteoarthritis 01/22/1988   knees, lumbar spondylosis and listhesis    Medications:  Medications Prior to Admission  Medication Sig Dispense Refill Last Dose   acetaminophen (TYLENOL 8 HOUR) 650 MG CR tablet Take 1 tablet (650 mg total) by mouth 2 (two) times a day.   10/03/2021   aspirin 81 MG chewable tablet Chew 1 tablet by mouth daily.   10/03/2021   Cholecalciferol (VITAMIN D3) 25 MCG (1000 UT) CAPS Take 1 capsule (1,000 Units total) by mouth daily. 30 capsule  10/03/2021   clopidogrel (PLAVIX) 75 MG tablet Take 1 tablet by mouth daily.  10/03/2021   cyanocobalamin 1000 MCG tablet Take 1,000 mcg by mouth daily.   10/03/2021   docusate sodium (COLACE) 100 MG capsule Take 1 capsule (100 mg total) by mouth daily.   10/03/2021   doxazosin (CARDURA) 4 MG tablet TAKE 1 TABLET BY MOUTH  DAILY 90 tablet 3 10/03/2021   famotidine (PEPCID) 20 MG tablet TAKE 1 TABLET BY MOUTH AT  BEDTIME 90 tablet 3 10/03/2021   finasteride (PROSCAR) 5 MG tablet TAKE 1 TABLET BY MOUTH  DAILY 90 tablet 3 10/03/2021   furosemide (LASIX) 20 MG tablet Take 1 tablet by mouth every other day.   Past Week   isosorbide mononitrate (IMDUR) 30 MG 24 hr tablet Take 1 tablet by mouth daily.   10/03/2021   metoprolol tartrate (LOPRESSOR) 50 MG tablet Take 0.5 tablets (25 mg total) by mouth 2 (two) times daily.   10/03/2021   primidone (MYSOLINE) 50 MG tablet Take 2 tablets (100 mg total) by mouth 3 (three) times daily.   10/03/2021   rosuvastatin (CRESTOR) 5 MG tablet Take 10 mg by mouth daily.   10/03/2021   atorvastatin (LIPITOR) 40 MG tablet Take 1 tablet by mouth at bedtime. (Patient not taking: Reported on 10/04/2021)   Not Taking   Coenzyme Q10 (CO Q-10) 100 MG CAPS Take 1 capsule by mouth  daily. (Patient not taking: Reported on 02/25/2021)  0 Not Taking   loratadine (CLARITIN) 10 MG tablet Take 1 tablet (10 mg total) by mouth daily as needed for allergies. 30 tablet  prn at prn   nitroGLYCERIN (NITROSTAT) 0.4 MG SL tablet Place under the tongue.   prn at prn   polyethylene glycol powder (GLYCOLAX/MIRALAX) 17 GM/SCOOP powder Take 8.5-17 g by mouth daily as needed for moderate constipation. 3350 g 0 prn    Assessment: Pharmacy consulted to dose heparin in this 88 year old male admitted with ACS/NSTEMI.  No prior anticoag noted. [place] trending up.   9/14 1019 HL 0.38 9/14 1913 HL 0.23 9/15 0610 HL 0.57, therapeutic X 1   Goal of Therapy:  Heparin level 0.3-0.7 units/ml Monitor platelets by anticoagulation protocol: Yes   Plan:  9/15:  HL @ 0610 = 0.57, therapeutic X 1 Will continue pt on current rate and draw confirmation level on 9/15 @ 1400.   Renee Erb D, PharmD 10/05/2021,7:16 AM

## 2021-10-05 NOTE — Progress Notes (Addendum)
[place] NOTE       Patient ID: [name] MRN: [id number] DOB/AGE: [birth year] 88 y.o.  Admit date: 10/04/2021 Referring Physician Dr. Judd Gaudier  Primary Physician Dr. Ola Spurr  Primary Cardiologist Dr. Nehemiah Massed Reason for Consultation NSTEMI  HPI: Shawn Andrews. Shawn Andrews is a 88yoM with a PMH of CAD ([place] 12/2020 with 80% distal RCA, 50% prox RCA, 55% ost-prox Lcx, and 40% or less RPDA and LAD - medically managed), HFrEF (EF 30-35% 09/2021 with multiple WMA's, previously 35-40%, G1 DD with multiple WMA's 10/2020), HTN, HLD, CKD 3, BPH, essential tremor and urinary retention with suprapubic catheter since 01/2021 who presented to [facility] ED the early morning hours of 10/04/2021 with "indigestion" and has an uptrending troponin from initially 88-1,100 for which cardiology is consulted for further assistance.  Interval history: -No acute events. -Remains angina free, BNP was elevated at 1800 yesterday, so IV Lasix was started and he has diuresed well so far. -Denies shortness of breath or other complaints  Review of systems complete and found to be negative unless listed above   Past Medical History:  Diagnosis Date   Abnormal drug screen 06/22/2014   inapprop neg xanax rpt 3 mo (06/2014)   BPH (benign prostatic hypertrophy) 01/21/1998   has had 3 biopsies in past ([facility]) decided to stop PSA/DRE   CHF (congestive heart failure) (HCC)    Coronary artery disease    Hyperlipidemia 01/21/2002   Hypertension 05/22/2003   Left lumbar radiculopathy    Osteoarthritis 01/22/1988   knees, lumbar spondylosis and listhesis    Past Surgical History:  Procedure Laterality Date   CATARACT EXTRACTION  2013   bilateral   ESI Left 08/2014, 11/2014, 08/2015   L S1, L L5/S1 transforaminal ESI; L4/5 L5/S1 zygapophysial injections, L S1 transforaminal ([facility])   FINGER SURGERY     right middle, partial traumatic amputation   IR CATHETER TUBE CHANGE  03/02/2021   KNEE  ARTHROSCOPY  1990   right   KNEE SURGERY  04/2006   right partial knee replacement in [place] - rec ppx abx for any invasive procedure   LEFT HEART CATH AND CORONARY ANGIOGRAPHY N/A 01/18/2021   Procedure: LEFT HEART CATH AND CORONARY ANGIOGRAPHY;  Surgeon: Corey Skains, MD;  Location: [facility];  Service: Cardiovascular;  Laterality: N/A;    Medications Prior to Admission  Medication Sig Dispense Refill Last Dose   acetaminophen (TYLENOL 8 HOUR) 650 MG CR tablet Take 1 tablet (650 mg total) by mouth 2 (two) times a day.   10/03/2021   aspirin 81 MG chewable tablet Chew 1 tablet by mouth daily.   10/03/2021   Cholecalciferol (VITAMIN D3) 25 MCG (1000 UT) CAPS Take 1 capsule (1,000 Units total) by mouth daily. 30 capsule  10/03/2021   clopidogrel (PLAVIX) 75 MG tablet Take 1 tablet by mouth daily.   10/03/2021   cyanocobalamin 1000 MCG tablet Take 1,000 mcg by mouth daily.   10/03/2021   docusate sodium (COLACE) 100 MG capsule Take 1 capsule (100 mg total) by mouth daily.   10/03/2021   doxazosin (CARDURA) 4 MG tablet TAKE 1 TABLET BY MOUTH  DAILY 90 tablet 3 10/03/2021   famotidine (PEPCID) 20 MG tablet TAKE 1 TABLET BY MOUTH AT  BEDTIME 90 tablet 3 10/03/2021   finasteride (PROSCAR) 5 MG tablet TAKE 1 TABLET BY MOUTH  DAILY 90 tablet 3 10/03/2021   furosemide (LASIX) 20 MG tablet Take 1 tablet by mouth every other day.  Past Week   isosorbide mononitrate (IMDUR) 30 MG 24 hr tablet Take 1 tablet by mouth daily.   10/03/2021   metoprolol tartrate (LOPRESSOR) 50 MG tablet Take 0.5 tablets (25 mg total) by mouth 2 (two) times daily.   10/03/2021   primidone (MYSOLINE) 50 MG tablet Take 2 tablets (100 mg total) by mouth 3 (three) times daily.   10/03/2021   rosuvastatin (CRESTOR) 5 MG tablet Take 10 mg by mouth daily.   10/03/2021   atorvastatin (LIPITOR) 40 MG tablet Take 1 tablet by mouth at bedtime. (Patient not taking: Reported on 10/04/2021)   Not Taking   Coenzyme Q10 (CO Q-10) 100 MG  CAPS Take 1 capsule by mouth daily. (Patient not taking: Reported on 02/25/2021)  0 Not Taking   loratadine (CLARITIN) 10 MG tablet Take 1 tablet (10 mg total) by mouth daily as needed for allergies. 30 tablet  prn at prn   nitroGLYCERIN (NITROSTAT) 0.4 MG SL tablet Place under the tongue.   prn at prn   polyethylene glycol powder (GLYCOLAX/MIRALAX) 17 GM/SCOOP powder Take 8.5-17 g by mouth daily as needed for moderate constipation. 3350 g 0 prn    Social History   Socioeconomic History   Marital status: Married    Spouse name: Not on file   Number of children: Not on file   Years of education: Not on file   Highest education level: Not on file  Occupational History   Occupation: retired Event organiser, Facilities manager, owns lawn care business  Tobacco Use   Smoking status: Former    Packs/day: 0.00    Years: 0.00    Total pack years: 0.00    Types: Pipe, Cigarettes    Quit date: 12/26/2000    Years since quitting: 20.7   Smokeless tobacco: Current    Types: Chew   Tobacco comments:    quit over 30 years ago  Vaping Use   Vaping Use: Never used  Substance and Sexual Activity   Alcohol use: Yes    Alcohol/week: 0.0 standard drinks of alcohol    Comment: Occasional beer   Drug use: No   Sexual activity: Not on file  Other Topics Concern   Not on file  Social History Narrative   Widowed, remarried 07/21/07.   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 [employer]    Activity: walks 1.5 miles 3d/wk   Diet: good water, fruits/vegetables daily   Social Determinants of Health   Financial Resource Strain: Not on file  Food Insecurity: No Food Insecurity (10/04/2021)   Hunger Vital Sign    Worried About Running Out of Food in the Last Year: Never true    [place] in the Last Year: Never true  Transportation Needs: No Transportation Needs (10/04/2021)   PRAPARE - Radiographer, therapeutic (Medical): No    Lack of Transportation (Non-Medical): No  Physical Activity: Not on file  Stress: Not on file  Social Connections: Not on file  Intimate Partner Violence: Not At Risk (10/04/2021)   Humiliation, Afraid, Rape, and Kick questionnaire    Fear of Current or Ex-Partner: No    Emotionally Abused: No    Physically Abused: No    Sexually Abused: No    Family History  Problem Relation Age of Onset   Hypertension Mother    Heart disease Brother 47       MI  Diabetes Brother    Cancer Daughter        melanoma   Stroke Neg Hx       PHYSICAL EXAM General: Pleasant, well-appearing elderly Caucasian male, laying in incline in hospital bed  HEENT:  Normocephalic and atraumatic. Neck:  No JVD.  Lungs: Normal respiratory effort on room air. Clear bilaterally to auscultation. No wheezes, crackles, rhonchi.  Heart: HRRR . Normal S1 and S2 without gallops or murmurs.  Abdomen: Non-distended appearing.  Msk: Normal strength and tone for age. Extremities: Warm and well perfused. No clubbing, cyanosis.  No peripheral edema.  Neuro: Alert and oriented X 3. Psych:  Answers questions appropriately.   Labs: Basic Metabolic Panel: Recent Labs    10/04/21 0922 10/05/21 0610  NA 140 137  K 4.2 4.4  CL 110 105  CO2 25 26  GLUCOSE 105* 114*  BUN 27* 29*  CREATININE 1.33* 1.57*  CALCIUM 9.0 9.1    Liver Function Tests: No results for input(s): "AST", "ALT", "ALKPHOS", "BILITOT", "PROT", "ALBUMIN" in the last 72 hours. No results for input(s): "LIPASE", "AMYLASE" in the last 72 hours. CBC: Recent Labs    10/04/21 0922 10/05/21 0610  WBC 6.9 7.6  HGB 11.8* 11.6*  HCT 34.4* 33.6*  MCV 86.4 85.1  PLT 154 164    Cardiac Enzymes: Recent Labs    10/04/21 0111 10/04/21 0922 10/04/21 1211  TROPONINIHS 727* 1,147* 994*    BNP: Invalid input(s): "POCBNP" D-Dimer: No results for input(s): "DDIMER" in the last 72 hours. Hemoglobin A1C: No results for  input(s): "HGBA1C" in the last 72 hours. Fasting Lipid Panel: No results for input(s): "CHOL", "HDL", "LDLCALC", "TRIG", "CHOLHDL", "LDLDIRECT" in the last 72 hours. Thyroid Function Tests: No results for input(s): "TSH", "T4TOTAL", "T3FREE", "THYROIDAB" in the last 72 hours.  Invalid input(s): "FREET3" Anemia Panel: No results for input(s): "VITAMINB12", "FOLATE", "FERRITIN", "TIBC", "IRON", "RETICCTPCT" in the last 72 hours.  ECHOCARDIOGRAM COMPLETE  Result Date: 10/04/2021    ECHOCARDIOGRAM REPORT   Patient Name:   DOSS CYBULSKI Date of Exam: 10/04/2021 Medical Rec #:  [id number]     Height:       70.0 in Accession #:    [id number]    Weight:       185.2 lb Date of Birth:  April 14, 1928     BSA:          2.020 m Patient Age:    76 years      BP:           111/67 mmHg Patient Gender: M             HR:           61 bpm. Exam Location:  [facility] Procedure: 2D Echo, Cardiac Doppler, Color Doppler and Strain Analysis Indications:     NSTEMI I21.4  History:         Patient has no prior history of Echocardiogram examinations.                  CHF; Risk Factors:Dyslipidemia and Hypertension.  Sonographer:     Sherrie Sport Referring Phys:  Athena Masse Diagnosing Phys: Isaias Cowman MD  Sonographer Comments: Global longitudinal strain was attempted. IMPRESSIONS  1. Left ventricular ejection fraction, by estimation, is 30 to 35%. The left ventricle has moderately decreased function. The left ventricle demonstrates regional wall motion abnormalities (see scoring diagram/findings for description). Left ventricular  diastolic parameters were normal.  2. Right ventricular systolic function is  normal. The right ventricular size is normal.  3. The mitral valve is normal in structure. Mild mitral valve regurgitation. No evidence of mitral stenosis.  4. The aortic valve is normal in structure. Aortic valve regurgitation is mild. No aortic stenosis is present.  5. The inferior vena cava is normal in size with greater than  50% respiratory variability, suggesting right atrial pressure of 3 mmHg. FINDINGS  Left Ventricle: Left ventricular ejection fraction, by estimation, is 30 to 35%. The left ventricle has moderately decreased function. The left ventricle demonstrates regional wall motion abnormalities. The left ventricular internal cavity size was normal in size. There is no left ventricular hypertrophy. Left ventricular diastolic parameters were normal.  LV Wall Scoring: The mid and distal anterior septum, apical lateral segment, apical inferior segment, and apex are hypokinetic. Right Ventricle: The right ventricular size is normal. No increase in right ventricular wall thickness. Right ventricular systolic function is normal. Left Atrium: Left atrial size was normal in size. Right Atrium: Right atrial size was normal in size. Pericardium: There is no evidence of pericardial effusion. Mitral Valve: The mitral valve is normal in structure. Mild mitral valve regurgitation. No evidence of mitral valve stenosis. Tricuspid Valve: The tricuspid valve is normal in structure. Tricuspid valve regurgitation is mild . No evidence of tricuspid stenosis. Aortic Valve: The aortic valve is normal in structure. Aortic valve regurgitation is mild. No aortic stenosis is present. Aortic valve mean gradient measures 3.0 mmHg. Aortic valve peak gradient measures 5.2 mmHg. Aortic valve area, by VTI measures 2.79 cm. Pulmonic Valve: The pulmonic valve was normal in structure. Pulmonic valve regurgitation is not visualized. No evidence of pulmonic stenosis. Aorta: The aortic root is normal in size and structure. Venous: The inferior vena cava is normal in size with greater than 50% respiratory variability, suggesting right atrial pressure of 3 mmHg. IAS/Shunts: No atrial level shunt detected by color flow Doppler.  LEFT VENTRICLE PLAX 2D LVIDd:         5.10 cm      Diastology LVIDs:         4.40 cm      LV e' medial:    4.24 cm/s LV PW:         1.10 cm       LV E/e' medial:  9.3 LV IVS:        1.30 cm      LV e' lateral:   6.20 cm/s LVOT diam:     2.30 cm      LV E/e' lateral: 6.3 LV SV:         69 LV SV Index:   34 LVOT Area:     4.15 cm                              3D Volume EF: LV Volumes (MOD)            3D EF:        34 % LV vol d, MOD A2C: 148.0 ml LV EDV:       248 ml LV vol d, MOD A4C: 139.0 ml LV ESV:       163 ml LV vol s, MOD A2C: 102.0 ml LV SV:        84 ml LV vol s, MOD A4C: 99.8 ml LV SV MOD A2C:     46.0 ml LV SV MOD A4C:     139.0 ml LV SV  MOD BP:      44.4 ml RIGHT VENTRICLE RV Basal diam:  3.70 cm RV Mid diam:    2.60 cm RV S prime:     9.25 cm/s TAPSE (M-mode): 1.7 cm LEFT ATRIUM           Index        RIGHT ATRIUM           Index LA diam:      4.50 cm 2.23 cm/m   RA Area:     12.60 cm LA Vol (A2C): 77.0 ml 38.12 ml/m  RA Volume:   28.20 ml  13.96 ml/m LA Vol (A4C): 84.5 ml 41.83 ml/m  AORTIC VALVE AV Area (Vmax):    2.52 cm AV Area (Vmean):   2.51 cm AV Area (VTI):     2.79 cm AV Vmax:           114.00 cm/s AV Vmean:          77.000 cm/s AV VTI:            0.246 m AV Peak Grad:      5.2 mmHg AV Mean Grad:      3.0 mmHg LVOT Vmax:         69.20 cm/s LVOT Vmean:        46.600 cm/s LVOT VTI:          0.165 m LVOT/AV VTI ratio: 0.67  AORTA Ao Root diam: 2.90 cm MITRAL VALVE               TRICUSPID VALVE MV Area (PHT): 2.87 cm    TR Peak grad:   22.3 mmHg MV Decel Time: 264 msec    TR Vmax:        236.00 cm/s MV E velocity: 39.30 cm/s MV A velocity: 90.70 cm/s  SHUNTS MV E/A ratio:  0.43        Systemic VTI:  0.16 m                            Systemic Diam: 2.30 cm Isaias Cowman MD Electronically signed by Isaias Cowman MD Signature Date/Time: 10/04/2021/1:39:08 PM    Final    DG Chest 2 View  Result Date: 10/03/2021 CLINICAL DATA:  Chest pain EXAM: CHEST - 2 VIEW COMPARISON:  01/16/2021 FINDINGS: Heart is normal size. Left lung clear. Right base atelectasis with small right pleural effusion. No acute bony abnormality.  Scattered aortic atherosclerosis. IMPRESSION: Small right pleural effusion with right base atelectasis. Electronically Signed   By: Rolm Baptise M.D.   On: 10/03/2021 23:05     Radiology: ECHOCARDIOGRAM COMPLETE  Result Date: 10/04/2021    ECHOCARDIOGRAM REPORT   Patient Name:   Shawn Andrews Date of Exam: 10/04/2021 Medical Rec #:  [id number]     Height:       70.0 in Accession #:    [id number]    Weight:       185.2 lb Date of Birth:  1928-03-30     BSA:          2.020 m Patient Age:    42 years      BP:           111/67 mmHg Patient Gender: M             HR:           61 bpm. Exam Location:  [facility] Procedure: 2D Echo, Cardiac Doppler,  Color Doppler and Strain Analysis Indications:     NSTEMI I21.4  History:         Patient has no prior history of Echocardiogram examinations.                  CHF; Risk Factors:Dyslipidemia and Hypertension.  Sonographer:     Sherrie Sport Referring Phys:  Athena Masse Diagnosing Phys: Isaias Cowman MD  Sonographer Comments: Global longitudinal strain was attempted. IMPRESSIONS  1. Left ventricular ejection fraction, by estimation, is 30 to 35%. The left ventricle has moderately decreased function. The left ventricle demonstrates regional wall motion abnormalities (see scoring diagram/findings for description). Left ventricular  diastolic parameters were normal.  2. Right ventricular systolic function is normal. The right ventricular size is normal.  3. The mitral valve is normal in structure. Mild mitral valve regurgitation. No evidence of mitral stenosis.  4. The aortic valve is normal in structure. Aortic valve regurgitation is mild. No aortic stenosis is present.  5. The inferior vena cava is normal in size with greater than 50% respiratory variability, suggesting right atrial pressure of 3 mmHg. FINDINGS  Left Ventricle: Left ventricular ejection fraction, by estimation, is 30 to 35%. The left ventricle has moderately decreased function. The left ventricle demonstrates  regional wall motion abnormalities. The left ventricular internal cavity size was normal in size. There is no left ventricular hypertrophy. Left ventricular diastolic parameters were normal.  LV Wall Scoring: The mid and distal anterior septum, apical lateral segment, apical inferior segment, and apex are hypokinetic. Right Ventricle: The right ventricular size is normal. No increase in right ventricular wall thickness. Right ventricular systolic function is normal. Left Atrium: Left atrial size was normal in size. Right Atrium: Right atrial size was normal in size. Pericardium: There is no evidence of pericardial effusion. Mitral Valve: The mitral valve is normal in structure. Mild mitral valve regurgitation. No evidence of mitral valve stenosis. Tricuspid Valve: The tricuspid valve is normal in structure. Tricuspid valve regurgitation is mild . No evidence of tricuspid stenosis. Aortic Valve: The aortic valve is normal in structure. Aortic valve regurgitation is mild. No aortic stenosis is present. Aortic valve mean gradient measures 3.0 mmHg. Aortic valve peak gradient measures 5.2 mmHg. Aortic valve area, by VTI measures 2.79 cm. Pulmonic Valve: The pulmonic valve was normal in structure. Pulmonic valve regurgitation is not visualized. No evidence of pulmonic stenosis. Aorta: The aortic root is normal in size and structure. Venous: The inferior vena cava is normal in size with greater than 50% respiratory variability, suggesting right atrial pressure of 3 mmHg. IAS/Shunts: No atrial level shunt detected by color flow Doppler.  LEFT VENTRICLE PLAX 2D LVIDd:         5.10 cm      Diastology LVIDs:         4.40 cm      LV e' medial:    4.24 cm/s LV PW:         1.10 cm      LV E/e' medial:  9.3 LV IVS:        1.30 cm      LV e' lateral:   6.20 cm/s LVOT diam:     2.30 cm      LV E/e' lateral: 6.3 LV SV:         69 LV SV Index:   34 LVOT Area:     4.15 cm  3D Volume EF: LV Volumes (MOD)             3D EF:        34 % LV vol d, MOD A2C: 148.0 ml LV EDV:       248 ml LV vol d, MOD A4C: 139.0 ml LV ESV:       163 ml LV vol s, MOD A2C: 102.0 ml LV SV:        84 ml LV vol s, MOD A4C: 99.8 ml LV SV MOD A2C:     46.0 ml LV SV MOD A4C:     139.0 ml LV SV MOD BP:      44.4 ml RIGHT VENTRICLE RV Basal diam:  3.70 cm RV Mid diam:    2.60 cm RV S prime:     9.25 cm/s TAPSE (M-mode): 1.7 cm LEFT ATRIUM           Index        RIGHT ATRIUM           Index LA diam:      4.50 cm 2.23 cm/m   RA Area:     12.60 cm LA Vol (A2C): 77.0 ml 38.12 ml/m  RA Volume:   28.20 ml  13.96 ml/m LA Vol (A4C): 84.5 ml 41.83 ml/m  AORTIC VALVE AV Area (Vmax):    2.52 cm AV Area (Vmean):   2.51 cm AV Area (VTI):     2.79 cm AV Vmax:           114.00 cm/s AV Vmean:          77.000 cm/s AV VTI:            0.246 m AV Peak Grad:      5.2 mmHg AV Mean Grad:      3.0 mmHg LVOT Vmax:         69.20 cm/s LVOT Vmean:        46.600 cm/s LVOT VTI:          0.165 m LVOT/AV VTI ratio: 0.67  AORTA Ao Root diam: 2.90 cm MITRAL VALVE               TRICUSPID VALVE MV Area (PHT): 2.87 cm    TR Peak grad:   22.3 mmHg MV Decel Time: 264 msec    TR Vmax:        236.00 cm/s MV E velocity: 39.30 cm/s MV A velocity: 90.70 cm/s  SHUNTS MV E/A ratio:  0.43        Systemic VTI:  0.16 m                            Systemic Diam: 2.30 cm Isaias Cowman MD Electronically signed by Isaias Cowman MD Signature Date/Time: 10/04/2021/1:39:08 PM    Final    DG Chest 2 View  Result Date: 10/03/2021 CLINICAL DATA:  Chest pain EXAM: CHEST - 2 VIEW COMPARISON:  01/16/2021 FINDINGS: Heart is normal size. Left lung clear. Right base atelectasis with small right pleural effusion. No acute bony abnormality. Scattered aortic atherosclerosis. IMPRESSION: Small right pleural effusion with right base atelectasis. Electronically Signed   By: Rolm Baptise M.D.   On: 10/03/2021 23:05    ECHO 11/14/2020 ECHOCARDIOGRAPHIC MEASUREMENTS  2D DIMENSIONS  AORTA                   Values   Normal Range   MAIN PA  Values    Normal Range                Annulus: nm*          [2.3-2.9]         PA Main: nm*       [1.5-2.1]              Aorta Sin: 4.1 cm       [3.1-3.7]    RIGHT VENTRICLE            ST Junction: nm*          [2.6-3.2]         RV Base: 4.8 cm    [< 4.2]              Asc.Aorta: 3.4 cm       [2.6-3.4]          RV Mid: nm*       [<3.5]  LEFT VENTRICLE                                      RV Length: nm*       [<8.6]                  LVIDd: 6.5 cm       [4.2-5.9]    INFERIOR VENA CAVA                  LVIDs: 4.8 cm                        Max. IVC: nm*       [<=2.1]                     FS: 26.1 %       [>25]            Min. IVC: nm*                    SWT: 1.1 cm       [0.6-1.0]    ------------------                    PWT: 1.1 cm       [0.6-1.0]    nm* - not measured  LEFT ATRIUM                LA Diam: 4.9 cm       [3.0-4.0]            LA A4C Area: nm*          [<20]              LA Volume: nm*          [18-58]  _________________________________________________________________________________________  ECHOCARDIOGRAPHIC DESCRIPTIONS  AORTIC ROOT                   Size: Normal             Dissection: INDETERM FOR DISSECTION  AORTIC VALVE               Leaflets: Tricuspid                   Morphology: MILDLY THICKENED               Mobility: Fully mobile  LEFT VENTRICLE  Size: MILDLY ENLARGED               Anterior: HYPOCONTRACTILE            Contraction: REGIONALLY IMPAIRED            Lateral: HYPOCONTRACTILE             Closest EF: 40% (Estimated)                 Septal: HYPOCONTRACTILE              LV Masses: No Masses                       Apical: HYPOCONTRACTILE                    LVH: None                          Inferior: Normal                                                      Posterior: Normal           Dias.FxClass: (Grade 1) relaxation abnormal, E/A reversal  MITRAL VALVE               Leaflets: Normal                         Mobility: Fully mobile             Morphology: Normal  LEFT ATRIUM                   Size: MODERATELY ENLARGED          LA Masses: No masses              IA Septum: Normal IAS  MAIN PA                   Size: Normal  PULMONIC VALVE             Morphology: Normal                        Mobility: Fully mobile  RIGHT VENTRICLE                   Size: MILDLY ENLARGED              Free Wall: Normal            Contraction: Normal                       RV Masses: No Masses  TRICUSPID VALVE               Leaflets: Normal                        Mobility: Fully mobile             Morphology: Normal  RIGHT ATRIUM                   Size: MODERATELY ENLARGED           RA Other: None  RA Mass: No masses  PERICARDIUM                  Fluid: No effusion  INFERIOR VENACAVA                   Size: Normal Normal respiratory collapse  _________________________________________________________________________________________   DOPPLER ECHO and OTHER SPECIAL PROCEDURES                 Aortic: No AR                      No AS                         118.3 cm/sec peak vel      5.6 mmHg peak grad                         2.8 mmHg mean grad         2.8 cm^2 by DOPPLER                 Mitral: MODERATE MR                No MS                         MV Inflow E Vel = 61.3 cm/sec      MV Annulus E'Vel = nm*                         E/E'Ratio = nm*              Tricuspid: MODERATE TR                No TS                         373.3 cm/sec peak TR vel   58.7 mmHg peak RV pressure              Pulmonary: TRIVIAL PR                 No PS  _________________________________________________________________________________________  INTERPRETATION  MILD-TO-MODERATE SEGMENTAL LV SYSTOLIC DYSFUNCTION WITH AN ESTIMATED EF = 35-40 %  NORMAL RIGHT VENTRICULAR SYSTOLIC FUNCTION  MILD-TO-MODERATE TRICUSPID AND MITRAL VALVE INSUFFICIENCY  NO VALVULAR STENOSIS  MODERATE BIATRIAL ENLARGEMENT   MILD LV ENLARGEMENT  MILD RV ENLARGEMENT  MILDLY DILATED AORTIC ROOT MEASURING UP TO 4.1 CM  APICAL ANTERIOR, AND SEPTAL WALL HYPOKINESIS   TELEMETRY reviewed by me (LT) 10/05/2021 : Sinus rhythm frequent PACs and PVCs with rate 60s  EKG reviewed by me: NSR anterolateral TWI overall similar to prior form 12/2020  Data reviewed by me (LT) 10/05/2021: BNP, hospitalist progress note, CBC, BMP, troponins, I's and O's, vitals, telemetry  ASSESSMENT AND PLAN:  Shawn Andrews is a 22yoM with a PMH of CAD ([place] 12/2020 with 80% distal RCA, 50% prox RCA, 55% ost-prox Lcx, and 40% or less RPDA and LAD - medically managed), HFrEF (EF 30-35% 09/2021 with multiple WMA's, previously 35-40%, G1 DD with multiple WMA's 10/2020), HTN, HLD, CKD 3, BPH, essential tremor and urinary retention with suprapubic catheter since 01/2021 who presented to [facility] ED the early morning hours of 10/04/2021 with "indigestion" and has an uptrending troponin from initially 88-1,100 for which cardiology is consulted for further assistance.  #  NSTEMI #CAD (80% distal RCA, 50% proximal RCA, 55% ostial-prox LCx) Presents after eating a hot dog, potato salad, and coconut pie at a church supper which were somewhat unusual foods for him.  His main symptom was "indigestion" and a burning/hurting in the center of his chest with a sensation of needing to burp that lasted about 2 hours, unrelieved by Pepcid, warm Coca-Cola, or Alka-Seltzer initially but eventually was self resolving in the ED.  He has overall been feeling well, exercising for an hour at a time at the [facility] 3 times weekly, most recently the morning of 9/13 without any sort of anginal equivalent and denies having symptoms of this quality or intensity in the past.  His troponin is elevated and uptrending with a current peak of 1100, but EKG looks overall similar, if not slightly improved compared to prior in December when he had his most recent LHC.  Since his symptoms have resolved  entirely thus far and he feels overall well, he and his wife strongly prefer conservative medical management rather than pursuing another heart catheterization at this time, which is certainly reasonable. -Trend troponin until peak -S/p 325 mg aspirin, continue DAPT with 81 mg aspirin and Plavix 75 mg daily -Continue heparin drip for 48 hours (ending 9/16 at 0230) -Continue metoprolol 25 mg twice daily -Continue isosorbide  60 mg once daily -Continue Crestor 20 mg (myalgias with high dose atorvastatin) -Repeat echocardiogram complete resulted with slightly reduced EF compared to prior at 30-35% with mid to distal anterior septal, apical lateral, apical inferior, and apex hypokinesis  #HFrEF (EF 30-35% 09/2021, previously 35-40%, G1 DD 10/2020) Appears compensated, on room air.  Chest x-ray with a small right pleural effusion/atelectasis.  BNP elevated on admission to 1800, although does not appear grossly volume overloaded appearing. -Continue GDMT with metoprolol, -Agree with IV Lasix for now, but watch renal function closely.  Urine output excellent at 3.8 L.   -Defer lisinopril due to nagging cough -Consider low-dose losartan after diuresis in addition to SGLT2i (per pharmacy, 10 mg of dapagliflozin be covered by his insurance)  #CKD 3 Baseline from earlier this year appears to be Cr. 1.5-1.7 and GFR 38-40). On admission creatinine 1.61 and GFR 40, -Worsened somewhat with diuresis at 1.57 and 41. -Monitor daily  This patient's plan of care was discussed and created with Dr. Saralyn Pilar and he is in agreement.  Signed: Tristan Schroeder , PA-C 10/05/2021, 12:18 PM [facility]

## 2021-10-06 LAB — BASIC METABOLIC PANEL
Anion gap: 6 (ref 5–15)
BUN: 38 mg/dL — ABNORMAL HIGH (ref 8–23)
CO2: 26 mmol/L (ref 22–32)
Calcium: 9 mg/dL (ref 8.9–10.3)
Chloride: 105 mmol/L (ref 98–111)
Creatinine, Ser: 1.72 mg/dL — ABNORMAL HIGH (ref 0.61–1.24)
GFR, Estimated: 37 mL/min — ABNORMAL LOW (ref >60)
Glucose, Bld: 112 mg/dL — ABNORMAL HIGH (ref 70–99)
Potassium: 4.4 mmol/L (ref 3.5–5.1)
Sodium: 137 mmol/L (ref 135–145)

## 2021-10-06 LAB — CBC
HCT: 33.7 % — ABNORMAL LOW (ref 39.0–52.0)
Hemoglobin: 11.7 g/dL — ABNORMAL LOW (ref 13.0–17.0)
MCH: 29.7 pg (ref 26.0–34.0)
MCHC: 34.7 g/dL (ref 30.0–36.0)
MCV: 85.5 fL (ref 80.0–100.0)
Platelets: 166 10*3/uL (ref 150–400)
RBC: 3.94 MIL/uL — ABNORMAL LOW (ref 4.22–5.81)
RDW: 13.1 % (ref 11.5–15.5)
WBC: 7.3 10*3/uL (ref 4.0–10.5)
nRBC: 0 % (ref 0.0–0.2)

## 2021-10-06 MED ORDER — ISOSORBIDE MONONITRATE ER 60 MG PO TB24: 60.0000 mg | ORAL_TABLET | Freq: Every day | ORAL | 2 refills | Status: DC

## 2021-10-06 MED ORDER — ROSUVASTATIN CALCIUM 20 MG PO TABS: 20.0000 mg | ORAL_TABLET | Freq: Every day | ORAL | 1 refills | Status: DC

## 2021-10-06 NOTE — Progress Notes (Addendum)
[facility] Cardiology  SUBJECTIVE: Patient laying in bed, reports doing well, denies chest pain or shortness of breath   Vitals:   10/05/21 1601 10/05/21 1924 10/05/21 2338 10/06/21 0352  BP: 127/63 124/67 128/61 135/63  Pulse: 64 73 62 61  Resp: '18 16 18 20  '$ Temp: 97.9 F (36.6 C) 98.1 F (36.7 C) 98 F (36.7 C) 97.8 F (36.6 C)  TempSrc:      SpO2: 98% 98% 99% 97%  Weight:      Height:         Intake/Output Summary (Last 24 hours) at 10/06/2021 5916 Last data filed at 10/06/2021 0730 Gross per 24 hour  Intake 480 ml  Output 2400 ml  Net -1920 ml      PHYSICAL EXAM  General: Well developed, well nourished, in no acute distress HEENT:  Normocephalic and atramatic Neck:  No JVD.  Lungs: Clear bilaterally to auscultation and percussion. Heart: HRRR . Normal S1 and S2 without gallops or murmurs.  Abdomen: Bowel sounds are positive, abdomen soft and non-tender  Msk:  Back normal, normal gait. Normal strength and tone for age. Extremities: No clubbing, cyanosis or edema.   Neuro: Alert and oriented X 3. Psych:  Good affect, responds appropriately   LABS: Basic Metabolic Panel: Recent Labs    10/05/21 0610 10/06/21 0753  NA 137 137  K 4.4 4.4  CL 105 105  CO2 26 26  GLUCOSE 114* 112*  BUN 29* 38*  CREATININE 1.57* 1.72*  CALCIUM 9.1 9.0   Liver Function Tests: No results for input(s): "AST", "ALT", "ALKPHOS", "BILITOT", "PROT", "ALBUMIN" in the last 72 hours. No results for input(s): "LIPASE", "AMYLASE" in the last 72 hours. CBC: Recent Labs    10/05/21 0610 10/06/21 0753  WBC 7.6 7.3  HGB 11.6* 11.7*  HCT 33.6* 33.7*  MCV 85.1 85.5  PLT 164 166   Cardiac Enzymes: No results for input(s): "CKTOTAL", "CKMB", "CKMBINDEX", "TROPONINI" in the last 72 hours. BNP: Invalid input(s): "POCBNP" D-Dimer: No results for input(s): "DDIMER" in the last 72 hours. Hemoglobin A1C: No results for input(s): "HGBA1C" in the last 72 hours. Fasting Lipid Panel: No  results for input(s): "CHOL", "HDL", "LDLCALC", "TRIG", "CHOLHDL", "LDLDIRECT" in the last 72 hours. Thyroid Function Tests: No results for input(s): "TSH", "T4TOTAL", "T3FREE", "THYROIDAB" in the last 72 hours.  Invalid input(s): "FREET3" Anemia Panel: No results for input(s): "VITAMINB12", "FOLATE", "FERRITIN", "TIBC", "IRON", "RETICCTPCT" in the last 72 hours.  ECHOCARDIOGRAM COMPLETE  Result Date: 10/04/2021    ECHOCARDIOGRAM REPORT   Patient Name:   Shawn Andrews Date of Exam: 10/04/2021 Medical Rec #:  [id number]     Height:       70.0 in Accession #:    [id number]    Weight:       185.2 lb Date of Birth:  1928-07-26     BSA:          2.020 m Patient Age:    88 years      BP:           111/67 mmHg Patient Gender: M             HR:           61 bpm. Exam Location:  [facility] Procedure: 2D Echo, Cardiac Doppler, Color Doppler and Strain Analysis Indications:     NSTEMI I21.4  History:         Patient has no prior history of Echocardiogram examinations.  CHF; Risk Factors:Dyslipidemia and Hypertension.  Sonographer:     Sherrie Sport Referring Phys:  Athena Masse Diagnosing Phys: Isaias Cowman MD  Sonographer Comments: Global longitudinal strain was attempted. IMPRESSIONS  1. Left ventricular ejection fraction, by estimation, is 30 to 35%. The left ventricle has moderately decreased function. The left ventricle demonstrates regional wall motion abnormalities (see scoring diagram/findings for description). Left ventricular  diastolic parameters were normal.  2. Right ventricular systolic function is normal. The right ventricular size is normal.  3. The mitral valve is normal in structure. Mild mitral valve regurgitation. No evidence of mitral stenosis.  4. The aortic valve is normal in structure. Aortic valve regurgitation is mild. No aortic stenosis is present.  5. The inferior vena cava is normal in size with greater than 50% respiratory variability, suggesting right atrial pressure of 3  mmHg. FINDINGS  Left Ventricle: Left ventricular ejection fraction, by estimation, is 30 to 35%. The left ventricle has moderately decreased function. The left ventricle demonstrates regional wall motion abnormalities. The left ventricular internal cavity size was normal in size. There is no left ventricular hypertrophy. Left ventricular diastolic parameters were normal.  LV Wall Scoring: The mid and distal anterior septum, apical lateral segment, apical inferior segment, and apex are hypokinetic. Right Ventricle: The right ventricular size is normal. No increase in right ventricular wall thickness. Right ventricular systolic function is normal. Left Atrium: Left atrial size was normal in size. Right Atrium: Right atrial size was normal in size. Pericardium: There is no evidence of pericardial effusion. Mitral Valve: The mitral valve is normal in structure. Mild mitral valve regurgitation. No evidence of mitral valve stenosis. Tricuspid Valve: The tricuspid valve is normal in structure. Tricuspid valve regurgitation is mild . No evidence of tricuspid stenosis. Aortic Valve: The aortic valve is normal in structure. Aortic valve regurgitation is mild. No aortic stenosis is present. Aortic valve mean gradient measures 3.0 mmHg. Aortic valve peak gradient measures 5.2 mmHg. Aortic valve area, by VTI measures 2.79 cm. Pulmonic Valve: The pulmonic valve was normal in structure. Pulmonic valve regurgitation is not visualized. No evidence of pulmonic stenosis. Aorta: The aortic root is normal in size and structure. Venous: The inferior vena cava is normal in size with greater than 50% respiratory variability, suggesting right atrial pressure of 3 mmHg. IAS/Shunts: No atrial level shunt detected by color flow Doppler.  LEFT VENTRICLE PLAX 2D LVIDd:         5.10 cm      Diastology LVIDs:         4.40 cm      LV e' medial:    4.24 cm/s LV PW:         1.10 cm      LV E/e' medial:  9.3 LV IVS:        1.30 cm      LV e' lateral:    6.20 cm/s LVOT diam:     2.30 cm      LV E/e' lateral: 6.3 LV SV:         69 LV SV Index:   34 LVOT Area:     4.15 cm                              3D Volume EF: LV Volumes (MOD)            3D EF:        34 % LV vol  d, MOD A2C: 148.0 ml LV EDV:       248 ml LV vol d, MOD A4C: 139.0 ml LV ESV:       163 ml LV vol s, MOD A2C: 102.0 ml LV SV:        84 ml LV vol s, MOD A4C: 99.8 ml LV SV MOD A2C:     46.0 ml LV SV MOD A4C:     139.0 ml LV SV MOD BP:      44.4 ml RIGHT VENTRICLE RV Basal diam:  3.70 cm RV Mid diam:    2.60 cm RV S prime:     9.25 cm/s TAPSE (M-mode): 1.7 cm LEFT ATRIUM           Index        RIGHT ATRIUM           Index LA diam:      4.50 cm 2.23 cm/m   RA Area:     12.60 cm LA Vol (A2C): 77.0 ml 38.12 ml/m  RA Volume:   28.20 ml  13.96 ml/m LA Vol (A4C): 84.5 ml 41.83 ml/m  AORTIC VALVE AV Area (Vmax):    2.52 cm AV Area (Vmean):   2.51 cm AV Area (VTI):     2.79 cm AV Vmax:           114.00 cm/s AV Vmean:          77.000 cm/s AV VTI:            0.246 m AV Peak Grad:      5.2 mmHg AV Mean Grad:      3.0 mmHg LVOT Vmax:         69.20 cm/s LVOT Vmean:        46.600 cm/s LVOT VTI:          0.165 m LVOT/AV VTI ratio: 0.67  AORTA Ao Root diam: 2.90 cm MITRAL VALVE               TRICUSPID VALVE MV Area (PHT): 2.87 cm    TR Peak grad:   22.3 mmHg MV Decel Time: 264 msec    TR Vmax:        236.00 cm/s MV E velocity: 39.30 cm/s MV A velocity: 90.70 cm/s  SHUNTS MV E/A ratio:  0.43        Systemic VTI:  0.16 m                            Systemic Diam: 2.30 cm Isaias Cowman MD Electronically signed by Isaias Cowman MD Signature Date/Time: 10/04/2021/1:39:08 PM    Final      Echo EF 30-35%, mild mitral regurgitation and aortic insufficiency  TELEMETRY: Sinus rhythm 70 bpm:  ASSESSMENT AND PLAN:  Principal Problem:   NSTEMI (non-ST elevated myocardial infarction) ([place]) Active Problems:   Essential hypertension   Benign prostatic hyperplasia   Essential tremor   Chronic kidney  disease, stage 3b (HCC)   Acute on chronic systolic CHF (congestive heart failure) (HCC)   Suprapubic catheter (HCC)   Overweight (BMI 25.0-29.9)    1.  NSTEMI, (88, 727, 1047, 994), on heparin infusion x 48 hours, no recurrent chest discomfort, patient and patient's wife strongly prefer conservative management 2.  CAD, 80% distal RCA, 55% ostial left circumflex, 45% mid LAD by [facility] 01/18/2021 3.  Chronic systolic congestive heart failure, HFrEF, EF 30-35% 10/04/2021 on GDMT, oxygen saturation 97%  on room air, appears euvolemic 4.  Chronic kidney disease, stage III, BUN and creatinine 27 and 1.33, respectively  Recommendations  1.  Agree with current therapy 2.  Continue dual antiplatelet therapy 3.  Continue isosorbide mononitrate, metoprolol tartrate 4.  Defer lisinoprol (cough), defer ARB/ARNi, MRA, SGLT2i due to renal insufficiency  5.  Continue rosuvastatin for hyperlipidemia management 6.  Consider discharge home today, follow-up with Dr. Nehemiah Massed in 1 to 2 weeks.  7. The patient will be going home with HHPT and is not appropriate for cardiac rehab at this time.    Isaias Cowman, MD, PhD, [facility] 10/06/2021 9:18 AM

## 2021-10-06 NOTE — Discharge Summary (Signed)
Physician Discharge Summary   Patient: Shawn Andrews MRN: [id number] DOB: Jul 15, 1928  Admit date:     10/04/2021  Discharge date: 10/06/21  Discharge Physician: Annita Brod   PCP: Leonel Ramsay, MD   Recommendations at discharge:   Medication change: Imdur increased from 30 to 60 mg daily Medication change: Crestor increased from 10 mg to 20 mg daily Discharged with home health PT Patient will follow-up with his cardiologist in 1 week  Discharge Diagnoses: Principal Problem:   NSTEMI (non-ST elevated myocardial infarction) [facility]) Active Problems:   Acute on chronic systolic CHF (congestive heart failure) (HCC)   Chronic kidney disease, stage 3b (HCC)   Suprapubic catheter ([place])   Essential hypertension   Essential tremor   Benign prostatic hyperplasia   Overweight (BMI 25.0-29.9)  Resolved Problems:   * No resolved hospital problems. *  Hospital Course: 88 year old male with past medical history of CAD status post MI a year ago status post stents as well as systolic heart failure with ejection fraction of 40%, urinary retention with suprapubic catheter and hypertension presented to the emergency room on the night of 9/13 with complaints of chest pain .  Initial troponin of 88 and then started to trend back up and patient felt to have a non-STEMI.  Cardiology consulted and with EKG actually noting improvement from December, patient and cardiology prefer medical management.  Patient placed on heparin for 48 hours with adjustments in medication.  Assessment and Plan: * NSTEMI (non-ST elevated myocardial infarction) (HCC) Continue 48 hours of IV heparin, completes late tonight.  Appreciate cardiology follow-up.  Continue aspirin, Plavix, Imdur (dose increased from 30-60) and metoprolol, crestor (dose increased from 10-20).  Medical management only  Acute on chronic systolic CHF (congestive heart failure) (HCC) Elevated BNP of almost 1900.  Echocardiogram done 9/14 notes  ejection fraction of 30 to 35%.  Have started IV Lasix.  Patient has diuresed almost 7 L of fluid  Chronic kidney disease, stage 3b ([place]) Renal function closer to baseline following diuresis.  Creatinine currently at baseline  Suprapubic catheter [facility]) Last changed 9/6  Essential hypertension Continue metoprolol  Essential tremor Continue primidone.  Followed by neurology  Benign prostatic hyperplasia Continue Proscar and Cardura  Overweight (BMI 25.0-29.9) Meets criteria with BMI greater than 25         Consultants: Cardiology Procedures performed: Echo cardiogram noting ejection fraction of 30 to 35% Disposition: Home Diet recommendation:  Discharge Diet Orders (From admission, onward)     Start     Ordered   10/06/21 0000  Diet - low sodium heart healthy        10/06/21 1252           Cardiac diet DISCHARGE MEDICATION: Allergies as of 10/06/2021       Reactions   Penicillins Swelling   Of tongue Has patient had a PCN reaction causing immediate rash, facial/tongue/throat swelling, SOB or lightheadedness with hypotension: Yes Has patient had a PCN reaction causing severe rash involving mucus membranes or skin necrosis: No Has patient had a PCN reaction that required hospitalization: No Has patient had a PCN reaction occurring within the last 10 years: No If all of the above answers are "NO", then may proceed with Cephalosporin use.   Cialis [tadalafil] Other (See Comments)   Indigestion and "felt like crap"   Lipitor [atorvastatin] Other (See Comments)   Severe myalgias   Lisinopril Cough   Dry nagging cough  Medication List     TAKE these medications    acetaminophen 650 MG CR tablet Commonly known as: Tylenol 8 Hour Take 1 tablet (650 mg total) by mouth 2 (two) times a day.   aspirin 81 MG chewable tablet Chew 1 tablet by mouth daily.   clopidogrel 75 MG tablet Commonly known as: PLAVIX Take 1 tablet by mouth daily.    cyanocobalamin 1000 MCG tablet Take 1,000 mcg by mouth daily.   docusate sodium 100 MG capsule Commonly known as: Colace Take 1 capsule (100 mg total) by mouth daily.   doxazosin 4 MG tablet Commonly known as: CARDURA TAKE 1 TABLET BY MOUTH  DAILY   famotidine 20 MG tablet Commonly known as: PEPCID TAKE 1 TABLET BY MOUTH AT  BEDTIME   finasteride 5 MG tablet Commonly known as: PROSCAR TAKE 1 TABLET BY MOUTH  DAILY   furosemide 20 MG tablet Commonly known as: LASIX Take 1 tablet by mouth every other day.   isosorbide mononitrate 60 MG 24 hr tablet Commonly known as: IMDUR Take 1 tablet (60 mg total) by mouth daily. Start taking on: October 07, 2021 What changed:  medication strength how much to take   loratadine 10 MG tablet Commonly known as: CLARITIN Take 1 tablet (10 mg total) by mouth daily as needed for allergies.   metoprolol tartrate 50 MG tablet Commonly known as: LOPRESSOR Take 0.5 tablets (25 mg total) by mouth 2 (two) times daily.   nitroGLYCERIN 0.4 MG SL tablet Commonly known as: NITROSTAT Place under the tongue.   polyethylene glycol powder 17 GM/SCOOP powder Commonly known as: GLYCOLAX/MIRALAX Take 8.5-17 g by mouth daily as needed for moderate constipation.   primidone 50 MG tablet Commonly known as: MYSOLINE Take 2 tablets (100 mg total) by mouth 3 (three) times daily.   rosuvastatin 20 MG tablet Commonly known as: CRESTOR Take 1 tablet (20 mg total) by mouth at bedtime. What changed:  medication strength how much to take when to take this   Vitamin D3 25 MCG (1000 UT) Caps Take 1 capsule (1,000 Units total) by mouth daily.        Follow-up Information     Corey Skains, MD. Schedule an appointment as soon as possible for a visit in 2 week(s).   Specialty: Cardiology Contact information: [address] [facility] [place] [address] [phone]                Discharge  Exam: Danley Danker Weights   10/03/21 2213  Weight: 84 kg   General: Alert and oriented x3 Cardiovascular: Regular rate and rhythm, S1-S2 Lungs: Clear to auscultation bilaterally  Condition at discharge: good  The results of significant diagnostics from this hospitalization (including imaging, microbiology, ancillary and laboratory) are listed below for reference.   Imaging Studies: ECHOCARDIOGRAM COMPLETE  Result Date: 10/04/2021    ECHOCARDIOGRAM REPORT   Patient Name:   Shawn Andrews Date of Exam: 10/04/2021 Medical Rec #:  [id number]     Height:       70.0 in Accession #:    [id number]    Weight:       185.2 lb Date of Birth:  1928-11-05     BSA:          2.020 m Patient Age:    89 years      BP:           111/67 mmHg Patient Gender: M  HR:           61 bpm. Exam Location:  [facility] Procedure: 2D Echo, Cardiac Doppler, Color Doppler and Strain Analysis Indications:     NSTEMI I21.4  History:         Patient has no prior history of Echocardiogram examinations.                  CHF; Risk Factors:Dyslipidemia and Hypertension.  Sonographer:     Sherrie Sport Referring Phys:  Athena Masse Diagnosing Phys: Isaias Cowman MD  Sonographer Comments: Global longitudinal strain was attempted. IMPRESSIONS  1. Left ventricular ejection fraction, by estimation, is 30 to 35%. The left ventricle has moderately decreased function. The left ventricle demonstrates regional wall motion abnormalities (see scoring diagram/findings for description). Left ventricular  diastolic parameters were normal.  2. Right ventricular systolic function is normal. The right ventricular size is normal.  3. The mitral valve is normal in structure. Mild mitral valve regurgitation. No evidence of mitral stenosis.  4. The aortic valve is normal in structure. Aortic valve regurgitation is mild. No aortic stenosis is present.  5. The inferior vena cava is normal in size with greater than 50% respiratory variability, suggesting right  atrial pressure of 3 mmHg. FINDINGS  Left Ventricle: Left ventricular ejection fraction, by estimation, is 30 to 35%. The left ventricle has moderately decreased function. The left ventricle demonstrates regional wall motion abnormalities. The left ventricular internal cavity size was normal in size. There is no left ventricular hypertrophy. Left ventricular diastolic parameters were normal.  LV Wall Scoring: The mid and distal anterior septum, apical lateral segment, apical inferior segment, and apex are hypokinetic. Right Ventricle: The right ventricular size is normal. No increase in right ventricular wall thickness. Right ventricular systolic function is normal. Left Atrium: Left atrial size was normal in size. Right Atrium: Right atrial size was normal in size. Pericardium: There is no evidence of pericardial effusion. Mitral Valve: The mitral valve is normal in structure. Mild mitral valve regurgitation. No evidence of mitral valve stenosis. Tricuspid Valve: The tricuspid valve is normal in structure. Tricuspid valve regurgitation is mild . No evidence of tricuspid stenosis. Aortic Valve: The aortic valve is normal in structure. Aortic valve regurgitation is mild. No aortic stenosis is present. Aortic valve mean gradient measures 3.0 mmHg. Aortic valve peak gradient measures 5.2 mmHg. Aortic valve area, by VTI measures 2.79 cm. Pulmonic Valve: The pulmonic valve was normal in structure. Pulmonic valve regurgitation is not visualized. No evidence of pulmonic stenosis. Aorta: The aortic root is normal in size and structure. Venous: The inferior vena cava is normal in size with greater than 50% respiratory variability, suggesting right atrial pressure of 3 mmHg. IAS/Shunts: No atrial level shunt detected by color flow Doppler.  LEFT VENTRICLE PLAX 2D LVIDd:         5.10 cm      Diastology LVIDs:         4.40 cm      LV e' medial:    4.24 cm/s LV PW:         1.10 cm      LV E/e' medial:  9.3 LV IVS:        1.30  cm      LV e' lateral:   6.20 cm/s LVOT diam:     2.30 cm      LV E/e' lateral: 6.3 LV SV:         69 LV SV Index:  34 LVOT Area:     4.15 cm                              3D Volume EF: LV Volumes (MOD)            3D EF:        34 % LV vol d, MOD A2C: 148.0 ml LV EDV:       248 ml LV vol d, MOD A4C: 139.0 ml LV ESV:       163 ml LV vol s, MOD A2C: 102.0 ml LV SV:        84 ml LV vol s, MOD A4C: 99.8 ml LV SV MOD A2C:     46.0 ml LV SV MOD A4C:     139.0 ml LV SV MOD BP:      44.4 ml RIGHT VENTRICLE RV Basal diam:  3.70 cm RV Mid diam:    2.60 cm RV S prime:     9.25 cm/s TAPSE (M-mode): 1.7 cm LEFT ATRIUM           Index        RIGHT ATRIUM           Index LA diam:      4.50 cm 2.23 cm/m   RA Area:     12.60 cm LA Vol (A2C): 77.0 ml 38.12 ml/m  RA Volume:   28.20 ml  13.96 ml/m LA Vol (A4C): 84.5 ml 41.83 ml/m  AORTIC VALVE AV Area (Vmax):    2.52 cm AV Area (Vmean):   2.51 cm AV Area (VTI):     2.79 cm AV Vmax:           114.00 cm/s AV Vmean:          77.000 cm/s AV VTI:            0.246 m AV Peak Grad:      5.2 mmHg AV Mean Grad:      3.0 mmHg LVOT Vmax:         69.20 cm/s LVOT Vmean:        46.600 cm/s LVOT VTI:          0.165 m LVOT/AV VTI ratio: 0.67  AORTA Ao Root diam: 2.90 cm MITRAL VALVE               TRICUSPID VALVE MV Area (PHT): 2.87 cm    TR Peak grad:   22.3 mmHg MV Decel Time: 264 msec    TR Vmax:        236.00 cm/s MV E velocity: 39.30 cm/s MV A velocity: 90.70 cm/s  SHUNTS MV E/A ratio:  0.43        Systemic VTI:  0.16 m                            Systemic Diam: 2.30 cm Isaias Cowman MD Electronically signed by Isaias Cowman MD Signature Date/Time: 10/04/2021/1:39:08 PM    Final    DG Chest 2 View  Result Date: 10/03/2021 CLINICAL DATA:  Chest pain EXAM: CHEST - 2 VIEW COMPARISON:  01/16/2021 FINDINGS: Heart is normal size. Left lung clear. Right base atelectasis with small right pleural effusion. No acute bony abnormality. Scattered aortic atherosclerosis. IMPRESSION: Small  right pleural effusion with right base atelectasis. Electronically Signed   By: Rolm Baptise M.D.   On: 10/03/2021 23:05  Microbiology: Results for orders placed or performed during the hospital encounter of 02/25/21  Urine Culture     Status: Abnormal   Collection Time: 02/25/21 11:56 AM   Specimen: Urine, Suprapubic  Result Value Ref Range Status   Specimen Description   Final    URINE, SUPRAPUBIC Performed at [facility], [address] [place], [place] [address]    Special Requests   Final    NONE Performed at [facility], [address] [place], [place] [address]    Culture MULTIPLE SPECIES PRESENT, SUGGEST RECOLLECTION (A)  Final   Report Status 02/27/2021 FINAL  Final  Resp Panel by RT-PCR (Flu A&B, Covid) Nasopharyngeal Swab     Status: None   Collection Time: 02/25/21  2:30 PM   Specimen: Nasopharyngeal Swab; Nasopharyngeal(NP) swabs in vial transport medium  Result Value Ref Range Status   SARS Coronavirus 2 by RT PCR NEGATIVE NEGATIVE Final    Comment: (NOTE) SARS-CoV-2 target nucleic acids are NOT DETECTED.  The SARS-CoV-2 RNA is generally detectable in upper respiratory specimens during the acute phase of infection. The lowest concentration of SARS-CoV-2 viral copies this assay can detect is 138 copies/mL. A negative result does not preclude SARS-Cov-2 infection and should not be used as the sole basis for treatment or other patient management decisions. A negative result may occur with  improper specimen collection/handling, submission of specimen other than nasopharyngeal swab, presence of viral mutation(s) within the areas targeted by this assay, and inadequate number of viral copies(<138 copies/mL). A negative result must be combined with clinical observations, patient history, and epidemiological information. The expected result is Negative.  Fact Sheet for Patients:  EntrepreneurPulse.com.au  Fact Sheet for  Healthcare Providers:  IncredibleEmployment.be  This test is no t yet approved or cleared by the [place] FDA and  has been authorized for detection and/or diagnosis of SARS-CoV-2 by FDA under an Emergency Use Authorization (EUA). This EUA will remain  in effect (meaning this test can be used) for the duration of the COVID-19 declaration under Section 564(b)(1) of the Act, 21 U.S.C.section 360bbb-3(b)(1), unless the authorization is terminated  or revoked sooner.       Influenza A by PCR NEGATIVE NEGATIVE Final   Influenza B by PCR NEGATIVE NEGATIVE Final    Comment: (NOTE) The Xpert Xpress SARS-CoV-2/FLU/RSV plus assay is intended as an aid in the diagnosis of influenza from Nasopharyngeal swab specimens and should not be used as a sole basis for treatment. Nasal washings and aspirates are unacceptable for Xpert Xpress SARS-CoV-2/FLU/RSV testing.  Fact Sheet for Patients: EntrepreneurPulse.com.au  Fact Sheet for Healthcare Providers: IncredibleEmployment.be  This test is not yet approved or cleared by the [place] FDA and has been authorized for detection and/or diagnosis of SARS-CoV-2 by FDA under an Emergency Use Authorization (EUA). This EUA will remain in effect (meaning this test can be used) for the duration of the COVID-19 declaration under Section 564(b)(1) of the Act, 21 U.S.C. section 360bbb-3(b)(1), unless the authorization is terminated or revoked.  Performed at [facility], [address] [place], [place] [address]     Labs: CBC: Recent Labs  Lab 10/03/21 2217 10/04/21 0922 10/05/21 0610 10/06/21 0753  WBC 7.6 6.9 7.6 7.3  HGB 13.2 11.8* 11.6* 11.7*  HCT 38.7* 34.4* 33.6* 33.7*  MCV 87.6 86.4 85.1 85.5  PLT 201 154 164 759   Basic Metabolic Panel: Recent Labs  Lab 10/03/21 2217 10/04/21 0922 10/05/21 0610 10/06/21 0753  NA 140  140 137 137  K 4.0 4.2 4.4 4.4  CL 105  110 105 105  CO2 '24 25 26 26  '$ GLUCOSE 153* 105* 114* 112*  BUN 31* 27* 29* 38*  CREATININE 1.61* 1.33* 1.57* 1.72*  CALCIUM 9.1 9.0 9.1 9.0   Liver Function Tests: No results for input(s): "AST", "ALT", "ALKPHOS", "BILITOT", "PROT", "ALBUMIN" in the last 168 hours. CBG: No results for input(s): "GLUCAP" in the last 168 hours.  Discharge time spent: less than 30 minutes.  Signed: Annita Brod, MD [facility] 10/06/2021

## 2021-11-06 NOTE — Progress Notes (Unsigned)
Suprapubic Cath Change  Patient is present today for a suprapubic catheter change due to urinary retention.  9 ml of water was drained from the balloon, a 16 FR foley cath was removed from the tract with out difficulty.  Site was cleaned and prepped in a sterile fashion with betadine.  A 16 FR foley cath was replaced into the tract no complications were noted. Urine return was noted, 10 ml of sterile water was inflated into the balloon and a leg bag was attached for drainage.  Patient tolerated well. A night bag was given to patient and proper instruction was given on how to switch bags.    A small granulation tissue was addressed w/ silver nitrate   Performed by: Michiel Cowboy, PA-C   Follow up: 6 weeks for SPT exchange.

## 2021-11-07 ENCOUNTER — Ambulatory Visit: Payer: Medicare Other | Admitting: Urology

## 2021-11-07 DIAGNOSIS — R339 Retention of urine, unspecified: Secondary | ICD-10-CM

## 2021-11-07 DIAGNOSIS — Z9359 Other cystostomy status: Secondary | ICD-10-CM

## 2021-11-19 ENCOUNTER — Encounter (INDEPENDENT_AMBULATORY_CARE_PROVIDER_SITE_OTHER): Payer: Self-pay

## 2021-12-11 ENCOUNTER — Other Ambulatory Visit: Payer: Self-pay | Admitting: Infectious Diseases

## 2021-12-11 DIAGNOSIS — K219 Gastro-esophageal reflux disease without esophagitis: Secondary | ICD-10-CM

## 2021-12-11 DIAGNOSIS — R1319 Other dysphagia: Secondary | ICD-10-CM

## 2021-12-17 ENCOUNTER — Other Ambulatory Visit: Payer: Self-pay | Admitting: Infectious Diseases

## 2021-12-17 ENCOUNTER — Ambulatory Visit
Admission: RE | Admit: 2021-12-17 | Discharge: 2021-12-17 | Disposition: A | Discharge status: Home / Self Care | Payer: Medicare Other | Source: Ambulatory Visit | Attending: Infectious Diseases | Admitting: Infectious Diseases

## 2021-12-17 DIAGNOSIS — R1319 Other dysphagia: Secondary | ICD-10-CM | POA: Insufficient documentation

## 2021-12-17 DIAGNOSIS — K219 Gastro-esophageal reflux disease without esophagitis: Secondary | ICD-10-CM

## 2021-12-18 NOTE — Progress Notes (Unsigned)
Suprapubic Cath Change  Patient is present today for a suprapubic catheter change due to urinary retention.  8 ml of water was drained from the balloon, a 16 FR foley cath was removed from the tract with out difficulty.  Site was cleaned and prepped in a sterile fashion with betadine.  A 16 FR foley cath was replaced into the tract no complications were noted. Urine return was noted, 10 ml of sterile water was inflated into the balloon and a leg bag was attached for drainage.  Patient tolerated well. A night bag was given to patient and proper instruction was given on how to switch bags.    Performed by: Louann Hopson, PA-C   Follow up: 6 weeks for SPT exchange  

## 2021-12-19 ENCOUNTER — Ambulatory Visit: Payer: Medicare Other | Admitting: Urology

## 2021-12-19 DIAGNOSIS — Z9359 Other cystostomy status: Secondary | ICD-10-CM | POA: Diagnosis not present

## 2021-12-19 DIAGNOSIS — R339 Retention of urine, unspecified: Secondary | ICD-10-CM | POA: Diagnosis not present

## 2022-01-29 NOTE — Progress Notes (Unsigned)
Suprapubic Cath Change  Patient is present today for a suprapubic catheter change due to urinary retention.  8 ml of water was drained from the balloon, a 16 FR foley cath was removed from the tract with out difficulty.  Site was cleaned and prepped in a sterile fashion with betadine.  A 16 FR foley cath was replaced into the tract no complications were noted. Urine return was noted, 10 ml of sterile water was inflated into the balloon and a leg bag was attached for drainage.  Patient tolerated well. A night bag was given to patient and proper instruction was given on how to switch bags.    Performed by: Glenna Brunkow, PA-C   Follow up: 6 weeks for SPT exchange  

## 2022-01-30 ENCOUNTER — Encounter: Payer: Self-pay | Admitting: Urology

## 2022-01-30 ENCOUNTER — Ambulatory Visit: Payer: Medicare Other | Admitting: Urology

## 2022-01-30 VITALS — Ht 70.0 in | Wt 185.0 lb

## 2022-01-30 DIAGNOSIS — R339 Retention of urine, unspecified: Secondary | ICD-10-CM | POA: Diagnosis not present

## 2022-01-30 DIAGNOSIS — Z9359 Other cystostomy status: Secondary | ICD-10-CM | POA: Diagnosis not present

## 2022-03-14 NOTE — Progress Notes (Signed)
Suprapubic Cath Change  Patient is present today for a suprapubic catheter change due to urinary retention.  8 ml of water was drained from the balloon, a 16 FR foley cath was removed from the tract with out difficulty.  Site was cleaned and prepped in a sterile fashion with betadine.  A 16 FR foley cath was replaced into the tract no complications were noted. Urine return was noted, 10 ml of sterile water was inflated into the balloon and a leg bag was attached for drainage.  Patient tolerated well. A night bag was given to patient and proper instruction was given on how to switch bags.    Performed by: Zara Council, PA-C   Follow up: 6 weeks for SPT exchange

## 2022-03-15 ENCOUNTER — Ambulatory Visit: Payer: Medicare Other | Admitting: Urology

## 2022-03-15 ENCOUNTER — Encounter: Payer: Self-pay | Admitting: Urology

## 2022-03-15 VITALS — BP 118/68 | HR 68 | Ht 70.0 in | Wt 185.0 lb

## 2022-03-15 DIAGNOSIS — Z9359 Other cystostomy status: Secondary | ICD-10-CM

## 2022-03-15 DIAGNOSIS — R339 Retention of urine, unspecified: Secondary | ICD-10-CM

## 2022-03-25 ENCOUNTER — Other Ambulatory Visit: Payer: Self-pay

## 2022-03-25 ENCOUNTER — Emergency Department: Payer: Medicare Other

## 2022-03-25 ENCOUNTER — Encounter: Payer: Self-pay | Admitting: Emergency Medicine

## 2022-03-25 ENCOUNTER — Inpatient Hospital Stay
Admission: EM | Admit: 2022-03-25 | Discharge: 2022-03-31 | DRG: 321 | Disposition: A | Discharge status: Home / Self Care | Payer: Medicare Other | Attending: Internal Medicine | Admitting: Internal Medicine

## 2022-03-25 DIAGNOSIS — I428 Other cardiomyopathies: Secondary | ICD-10-CM | POA: Diagnosis present

## 2022-03-25 DIAGNOSIS — Y831 Surgical operation with implant of artificial internal device as the cause of abnormal reaction of the patient, or of later complication, without mention of misadventure at the time of the procedure: Secondary | ICD-10-CM | POA: Diagnosis present

## 2022-03-25 DIAGNOSIS — I2510 Atherosclerotic heart disease of native coronary artery without angina pectoris: Secondary | ICD-10-CM | POA: Diagnosis present

## 2022-03-25 DIAGNOSIS — R338 Other retention of urine: Secondary | ICD-10-CM | POA: Diagnosis present

## 2022-03-25 DIAGNOSIS — Z9359 Other cystostomy status: Secondary | ICD-10-CM

## 2022-03-25 DIAGNOSIS — Z7982 Long term (current) use of aspirin: Secondary | ICD-10-CM

## 2022-03-25 DIAGNOSIS — T82855A Stenosis of coronary artery stent, initial encounter: Secondary | ICD-10-CM | POA: Diagnosis not present

## 2022-03-25 DIAGNOSIS — I252 Old myocardial infarction: Secondary | ICD-10-CM

## 2022-03-25 DIAGNOSIS — Z955 Presence of coronary angioplasty implant and graft: Secondary | ICD-10-CM

## 2022-03-25 DIAGNOSIS — Z1152 Encounter for screening for COVID-19: Secondary | ICD-10-CM

## 2022-03-25 DIAGNOSIS — R413 Other amnesia: Secondary | ICD-10-CM | POA: Diagnosis present

## 2022-03-25 DIAGNOSIS — I255 Ischemic cardiomyopathy: Secondary | ICD-10-CM | POA: Diagnosis present

## 2022-03-25 DIAGNOSIS — I5023 Acute on chronic systolic (congestive) heart failure: Secondary | ICD-10-CM | POA: Diagnosis present

## 2022-03-25 DIAGNOSIS — E785 Hyperlipidemia, unspecified: Secondary | ICD-10-CM | POA: Diagnosis present

## 2022-03-25 DIAGNOSIS — N1832 Chronic kidney disease, stage 3b: Secondary | ICD-10-CM | POA: Diagnosis present

## 2022-03-25 DIAGNOSIS — F17220 Nicotine dependence, chewing tobacco, uncomplicated: Secondary | ICD-10-CM | POA: Diagnosis present

## 2022-03-25 DIAGNOSIS — Z833 Family history of diabetes mellitus: Secondary | ICD-10-CM

## 2022-03-25 DIAGNOSIS — I493 Ventricular premature depolarization: Secondary | ICD-10-CM

## 2022-03-25 DIAGNOSIS — Z936 Other artificial openings of urinary tract status: Secondary | ICD-10-CM

## 2022-03-25 DIAGNOSIS — I21A9 Other myocardial infarction type: Secondary | ICD-10-CM | POA: Diagnosis present

## 2022-03-25 DIAGNOSIS — Z808 Family history of malignant neoplasm of other organs or systems: Secondary | ICD-10-CM

## 2022-03-25 DIAGNOSIS — Z79899 Other long term (current) drug therapy: Secondary | ICD-10-CM

## 2022-03-25 DIAGNOSIS — I10 Essential (primary) hypertension: Secondary | ICD-10-CM | POA: Diagnosis present

## 2022-03-25 DIAGNOSIS — Z7902 Long term (current) use of antithrombotics/antiplatelets: Secondary | ICD-10-CM

## 2022-03-25 DIAGNOSIS — I5020 Unspecified systolic (congestive) heart failure: Secondary | ICD-10-CM | POA: Diagnosis present

## 2022-03-25 DIAGNOSIS — E669 Obesity, unspecified: Secondary | ICD-10-CM | POA: Diagnosis present

## 2022-03-25 DIAGNOSIS — G250 Essential tremor: Secondary | ICD-10-CM | POA: Diagnosis present

## 2022-03-25 DIAGNOSIS — N401 Enlarged prostate with lower urinary tract symptoms: Secondary | ICD-10-CM | POA: Diagnosis present

## 2022-03-25 DIAGNOSIS — I130 Hypertensive heart and chronic kidney disease with heart failure and stage 1 through stage 4 chronic kidney disease, or unspecified chronic kidney disease: Secondary | ICD-10-CM | POA: Diagnosis present

## 2022-03-25 DIAGNOSIS — J9601 Acute respiratory failure with hypoxia: Secondary | ICD-10-CM | POA: Diagnosis present

## 2022-03-25 DIAGNOSIS — Z86718 Personal history of other venous thrombosis and embolism: Secondary | ICD-10-CM

## 2022-03-25 DIAGNOSIS — Z8249 Family history of ischemic heart disease and other diseases of the circulatory system: Secondary | ICD-10-CM

## 2022-03-25 DIAGNOSIS — I214 Non-ST elevation (NSTEMI) myocardial infarction: Secondary | ICD-10-CM | POA: Diagnosis not present

## 2022-03-25 LAB — HEPATIC FUNCTION PANEL
ALT: 12 U/L (ref 0–44)
AST: 18 U/L (ref 15–41)
Albumin: 4.4 g/dL (ref 3.5–5.0)
Alkaline Phosphatase: 68 U/L (ref 38–126)
Bilirubin, Direct: <0.1 mg/dL (ref 0.0–0.2)
Total Bilirubin: 0.4 mg/dL (ref 0.3–1.2)
Total Protein: 6.8 g/dL (ref 6.5–8.1)

## 2022-03-25 LAB — BASIC METABOLIC PANEL
Anion gap: 9 (ref 5–15)
BUN: 43 mg/dL — ABNORMAL HIGH (ref 8–23)
CO2: 20 mmol/L — ABNORMAL LOW (ref 22–32)
Calcium: 8.9 mg/dL (ref 8.9–10.3)
Chloride: 104 mmol/L (ref 98–111)
Creatinine, Ser: 1.93 mg/dL — ABNORMAL HIGH (ref 0.61–1.24)
GFR, Estimated: 32 mL/min — ABNORMAL LOW (ref >60)
Glucose, Bld: 177 mg/dL — ABNORMAL HIGH (ref 70–99)
Potassium: 4.6 mmol/L (ref 3.5–5.1)
Sodium: 133 mmol/L — ABNORMAL LOW (ref 135–145)

## 2022-03-25 LAB — RESP PANEL BY RT-PCR (RSV, FLU A&B, COVID)  RVPGX2
Influenza A by PCR: NEGATIVE
Influenza B by PCR: NEGATIVE
Resp Syncytial Virus by PCR: NEGATIVE
SARS Coronavirus 2 by RT PCR: NEGATIVE

## 2022-03-25 LAB — CBC
HCT: 36.2 % — ABNORMAL LOW (ref 39.0–52.0)
Hemoglobin: 11.7 g/dL — ABNORMAL LOW (ref 13.0–17.0)
MCH: 30 pg (ref 26.0–34.0)
MCHC: 32.3 g/dL (ref 30.0–36.0)
MCV: 92.8 fL (ref 80.0–100.0)
Platelets: 173 10*3/uL (ref 150–400)
RBC: 3.9 MIL/uL — ABNORMAL LOW (ref 4.22–5.81)
RDW: 13.5 % (ref 11.5–15.5)
WBC: 9.1 10*3/uL (ref 4.0–10.5)
nRBC: 0 % (ref 0.0–0.2)

## 2022-03-25 LAB — LIPASE, BLOOD: Lipase: 46 U/L (ref 11–51)

## 2022-03-25 LAB — TROPONIN I (HIGH SENSITIVITY)
Troponin I (High Sensitivity): 32 ng/L — ABNORMAL HIGH (ref <18)
Troponin I (High Sensitivity): 262 ng/L (ref <18)

## 2022-03-25 LAB — BRAIN NATRIURETIC PEPTIDE: B Natriuretic Peptide: 598.2 pg/mL — ABNORMAL HIGH (ref 0.0–100.0)

## 2022-03-25 MED ORDER — HEPARIN (PORCINE) 25000 UT/250ML-% IV SOLN
1100.0000 [IU]/h | INTRAVENOUS | Status: DC
  Administered 2022-03-26 (×2): 1100 [IU]/h via INTRAVENOUS
  Filled 2022-03-25 (×2): qty 250

## 2022-03-25 MED ORDER — MORPHINE SULFATE (PF) 4 MG/ML IV SOLN
4.0000 mg | Freq: Once | INTRAVENOUS | Status: AC
  Administered 2022-03-25: 4 mg via INTRAVENOUS
  Filled 2022-03-25: qty 1

## 2022-03-25 MED ORDER — MORPHINE SULFATE (PF) 4 MG/ML IV SOLN: 4.0000 mg | Freq: Once | INTRAVENOUS | Status: DC

## 2022-03-25 MED ORDER — HEPARIN BOLUS VIA INFUSION
4000.0000 [IU] | Freq: Once | INTRAVENOUS | Status: AC
  Administered 2022-03-26: 4000 [IU] via INTRAVENOUS
  Filled 2022-03-25: qty 4000

## 2022-03-25 MED ORDER — IOHEXOL 350 MG/ML SOLN
80.0000 mL | Freq: Once | INTRAVENOUS | Status: AC | PRN
  Administered 2022-03-25: 80 mL via INTRAVENOUS

## 2022-03-25 MED ORDER — HYDROMORPHONE HCL 1 MG/ML IJ SOLN
0.5000 mg | Freq: Once | INTRAMUSCULAR | Status: AC
  Administered 2022-03-25: 0.5 mg via INTRAVENOUS
  Filled 2022-03-25: qty 0.5

## 2022-03-25 MED ORDER — METOPROLOL TARTRATE 5 MG/5ML IV SOLN
5.0000 mg | Freq: Once | INTRAVENOUS | Status: AC
  Administered 2022-03-26: 5 mg via INTRAVENOUS
  Filled 2022-03-25: qty 5

## 2022-03-25 MED ORDER — NITROGLYCERIN IN D5W 200-5 MCG/ML-% IV SOLN
0.0000 ug/min | INTRAVENOUS | Status: DC
  Administered 2022-03-25 (×2): 30 ug/min via INTRAVENOUS
  Filled 2022-03-25: qty 250

## 2022-03-25 MED ORDER — ASPIRIN 81 MG PO CHEW
324.0000 mg | CHEWABLE_TABLET | Freq: Once | ORAL | Status: AC
  Administered 2022-03-25: 324 mg via ORAL
  Filled 2022-03-25: qty 4

## 2022-03-25 MED ORDER — ONDANSETRON HCL 4 MG/2ML IJ SOLN
4.0000 mg | Freq: Once | INTRAMUSCULAR | Status: AC
  Administered 2022-03-25: 4 mg via INTRAVENOUS
  Filled 2022-03-25: qty 2

## 2022-03-25 MED ORDER — FUROSEMIDE 10 MG/ML IJ SOLN
40.0000 mg | Freq: Once | INTRAMUSCULAR | Status: AC
  Administered 2022-03-25: 40 mg via INTRAVENOUS
  Filled 2022-03-25: qty 4

## 2022-03-25 NOTE — ED Notes (Signed)
Pain relieved by morphine for 30 mins/ pain is back 9/10 pain/ MD aware

## 2022-03-25 NOTE — Assessment & Plan Note (Signed)
BPH with chronic urinary retention Continue suprapubic Continue Proscar and Cardura

## 2022-03-25 NOTE — H&P (Signed)
History and Physical    Patient: Shawn Andrews [id number] DOB: 1928-02-25 DOA: 03/25/2022 DOS: the patient was seen and examined on 03/25/2022 PCP: Leonel Ramsay, MD  Patient coming from: Home  Chief Complaint:  Chief Complaint  Patient presents with   Chest Pain    HPI: Shawn Andrews is a 89 y.o. male with medical history significant for CAD s/p prior stents, HFrEF (EF 30 to 35% 10/04/2021), BPH, HTN, , essential tremor and BPH with chronic urinary retention s/p suprapubic catheter  who presents to the ED with substernal chest pain unrelieved after nitroglycerin and antacids at home.  Patient was hospitalized back in September 2023 with NSTEMI with troponin peak in the 900s and was treated medically with 48 hours of IV heparin and medication optimization.  He had a follow-up myocardial perfusion scan in December 2023 that showed no clear evidence of reversible ischemia but with the conclusion that it was a high risk study.  His last cath was in December 2022 that showed a distal 80% RCA lesion with no stent placement at the time.  Patient was last seen by his cardiologist on 02/25/2022 and was stable at the time, without chest pain but with occasional shortness of breath.  This current episode started an hour prior to arrival and is of severe intensity, unrelieved with nitro.  It is nonradiating.  He denies lower extremity pain or swelling.  Has no nausea, vomiting or diaphoresis. ED course and data review:BP 182/95 with pulse in the 90s, respirations 27 and O2 sat in the low 90s on room air.  Troponin 32-2 62 with BNP 598.  Creatinine at baseline at 1.93.  WBC normal hemoglobin at baseline at 11.7.  Respiratory viral panel negative. EKG, independently interpreted shows sinus at 99 with nonspecific ST-T wave changes and occasional PVCs.  CTA chest negative for PE and showing cardiomegaly with mild interstitial edema with moderate left and small right pleural effusions Patient was treated with  morphine and Zofran and chewable aspirin 325.  He continued to have recurring pain and was given Dilaudid and furosemide.  Repeat EKG showed no changes to prior. The ED provider spoke to Dr. Clayborn Bigness due to ongoing chest pain.Marland Kitchen  He advised to start patient on nitroglycerin and heparin infusions and give IV metoprolol and to update him if no improvement within the hour.  Hospitalist consulted for admission.   Review of Systems: As mentioned in the history of present illness. All other systems reviewed and are negative.  Past Medical History:  Diagnosis Date   Abnormal drug screen 06/22/2014   inapprop neg xanax rpt 3 mo (06/2014)   BPH (benign prostatic hypertrophy) 01/21/1998   has had 3 biopsies in past ([facility]) decided to stop PSA/DRE   CHF (congestive heart failure) (HCC)    Coronary artery disease    Hyperlipidemia 01/21/2002   Hypertension 05/22/2003   Left lumbar radiculopathy    Osteoarthritis 01/22/1988   knees, lumbar spondylosis and listhesis   Past Surgical History:  Procedure Laterality Date   CATARACT EXTRACTION  2013   bilateral   ESI Left 08/2014, 11/2014, 08/2015   L S1, L L5/S1 transforaminal ESI; L4/5 L5/S1 zygapophysial injections, L S1 transforaminal ([facility])   FINGER SURGERY     right middle, partial traumatic amputation   IR CATHETER TUBE CHANGE  03/02/2021   KNEE ARTHROSCOPY  1990   right   KNEE SURGERY  04/2006   right partial knee replacement in [place] - rec ppx abx for  any invasive procedure   LEFT HEART CATH AND CORONARY ANGIOGRAPHY N/A 01/18/2021   Procedure: LEFT HEART CATH AND CORONARY ANGIOGRAPHY;  Surgeon: Corey Skains, MD;  Location: [facility];  Service: Cardiovascular;  Laterality: N/A;   Social History:  reports that he quit smoking about 21 years ago. His smoking use included pipe and cigarettes. He has been exposed to tobacco smoke. His smokeless tobacco use includes chew. He reports current alcohol use. He reports that he does  not use drugs.  Allergies  Allergen Reactions   Penicillins Swelling    Of tongue Has patient had a PCN reaction causing immediate rash, facial/tongue/throat swelling, SOB or lightheadedness with hypotension: Yes Has patient had a PCN reaction causing severe rash involving mucus membranes or skin necrosis: No Has patient had a PCN reaction that required hospitalization: No Has patient had a PCN reaction occurring within the last 10 years: No If all of the above answers are "NO", then may proceed with Cephalosporin use.   Cialis [Tadalafil] Other (See Comments)    Indigestion and "felt like crap"   Lipitor [Atorvastatin] Other (See Comments)    Severe myalgias   Lisinopril Cough    Dry nagging cough    Family History  Problem Relation Age of Onset   Hypertension Mother    Heart disease Brother 75       MI   Diabetes Brother    Cancer Daughter        melanoma   Stroke Neg Hx     Prior to Admission medications   Medication Sig Start Date End Date Taking? Authorizing Provider  acetaminophen (TYLENOL 8 HOUR) 650 MG CR tablet Take 1 tablet (650 mg total) by mouth 2 (two) times a day. 06/24/18   Ria Bush, MD  aspirin 81 MG chewable tablet Chew 1 tablet by mouth daily.    [provider]  Cholecalciferol (VITAMIN D3) 25 MCG (1000 UT) CAPS Take 1 capsule (1,000 Units total) by mouth daily. 08/25/18   Ria Bush, MD  clopidogrel (PLAVIX) 75 MG tablet Take 1 tablet by mouth daily. 10/10/20   [provider]  cyanocobalamin 1000 MCG tablet Take 1,000 mcg by mouth daily.    [provider]  docusate sodium (COLACE) 100 MG capsule Take 1 capsule (100 mg total) by mouth daily. 12/13/20   Ria Bush, MD  doxazosin (CARDURA) 4 MG tablet TAKE 1 TABLET BY MOUTH  DAILY 11/17/19   Ria Bush, MD  famotidine (PEPCID) 20 MG tablet TAKE 1 TABLET BY MOUTH AT  BEDTIME 01/23/21   Ria Bush, MD  finasteride (PROSCAR) 5 MG tablet TAKE 1 TABLET BY  MOUTH  DAILY 11/17/19   Ria Bush, MD  furosemide (LASIX) 20 MG tablet Take 1 tablet by mouth every other day. 10/09/20   [provider]  hydrALAZINE (APRESOLINE) 25 MG tablet Take by mouth. 02/25/22   [provider]  isosorbide mononitrate (IMDUR) 60 MG 24 hr tablet Take 1 tablet (60 mg total) by mouth daily. 10/07/21   Annita Brod, MD  loratadine (CLARITIN) 10 MG tablet Take 1 tablet (10 mg total) by mouth daily as needed for allergies. 12/22/12   Ria Bush, MD  metoprolol tartrate (LOPRESSOR) 50 MG tablet Take 0.5 tablets (25 mg total) by mouth 2 (two) times daily. 10/16/20   Ria Bush, MD  nitroGLYCERIN (NITROSTAT) 0.4 MG SL tablet Place under the tongue. 10/09/20   [provider]  pantoprazole (PROTONIX) 40 MG tablet Take by  mouth. 02/14/22 02/14/23  [provider]  polyethylene glycol powder (GLYCOLAX/MIRALAX) 17 GM/SCOOP powder Take 8.5-17 g by mouth daily as needed for moderate constipation. 12/13/20   Ria Bush, MD  primidone (MYSOLINE) 50 MG tablet Take 2 tablets (100 mg total) by mouth 3 (three) times daily. 10/16/20   Ria Bush, MD  rosuvastatin (CRESTOR) 20 MG tablet Take 1 tablet (20 mg total) by mouth at bedtime. 10/06/21   Annita Brod, MD    Physical Exam: Vitals:   03/25/22 2130 03/25/22 2132 03/25/22 2215 03/25/22 2345  BP: (!) 153/70  (!) 142/53 (!) 142/64  Pulse: (!) 43  (!) 44 89  Resp: (!) 27  (!) 27 (!) 22  Temp:      TempSrc:      SpO2: 91% 92% 93% 90%  Weight:      Height:       Physical Exam Vitals and nursing note reviewed.  Constitutional:      General: He is not in acute distress.    Comments: Frail-appearing elderly male  HENT:     Head: Normocephalic and atraumatic.  Cardiovascular:     Rate and Rhythm: Normal rate and regular rhythm.     Heart sounds: Normal heart sounds.  Pulmonary:     Effort: Tachypnea present.     Breath sounds: Normal breath sounds.  Abdominal:      Palpations: Abdomen is soft.     Tenderness: There is no abdominal tenderness.  Neurological:     Mental Status: Mental status is at baseline.     Labs on Admission: I have personally reviewed following labs and imaging studies  CBC: Recent Labs  Lab 03/25/22 2019  WBC 9.1  HGB 11.7*  HCT 36.2*  MCV 92.8  PLT [id number]   Basic Metabolic Panel: Recent Labs  Lab 03/25/22 2019  NA 133*  K 4.6  CL 104  CO2 20*  GLUCOSE 177*  BUN 43*  CREATININE 1.93*  CALCIUM 8.9   GFR: Estimated Creatinine Clearance: 25.7 mL/min (A) (by C-G formula based on SCr of 1.93 mg/dL (H)). Liver Function Tests: Recent Labs  Lab 03/25/22 2019  AST 18  ALT 12  ALKPHOS 68  BILITOT 0.4  PROT 6.8  ALBUMIN 4.4   Recent Labs  Lab 03/25/22 2019  LIPASE 46   No results for input(s): "AMMONIA" in the last 168 hours. Coagulation Profile: No results for input(s): "INR", "PROTIME" in the last 168 hours. Cardiac Enzymes: No results for input(s): "CKTOTAL", "CKMB", "CKMBINDEX", "TROPONINI" in the last 168 hours. BNP (last 3 results) No results for input(s): "PROBNP" in the last 8760 hours. HbA1C: No results for input(s): "HGBA1C" in the last 72 hours. CBG: No results for input(s): "GLUCAP" in the last 168 hours. Lipid Profile: No results for input(s): "CHOL", "HDL", "LDLCALC", "TRIG", "CHOLHDL", "LDLDIRECT" in the last 72 hours. Thyroid Function Tests: No results for input(s): "TSH", "T4TOTAL", "FREET4", "T3FREE", "THYROIDAB" in the last 72 hours. Anemia Panel: No results for input(s): "VITAMINB12", "FOLATE", "FERRITIN", "TIBC", "IRON", "RETICCTPCT" in the last 72 hours. Urine analysis:    Component Value Date/Time   COLORURINE YELLOW 02/25/2021 1156   APPEARANCEUR CLOUDY (A) 02/25/2021 1156   LABSPEC 1.020 02/25/2021 1156   PHURINE 6.0 02/25/2021 1156   GLUCOSEU NEGATIVE 02/25/2021 1156   GLUCOSEU NEGATIVE 12/25/2020 1556   HGBUR LARGE (A) 02/25/2021 1156   BILIRUBINUR NEGATIVE  02/25/2021 1156   BILIRUBINUR negative 07/29/2017 1005   KETONESUR NEGATIVE 02/25/2021 1156   PROTEINUR >300 (A)  02/25/2021 1156   UROBILINOGEN 0.2 12/25/2020 1556   NITRITE NEGATIVE 02/25/2021 1156   LEUKOCYTESUR MODERATE (A) 02/25/2021 1156    Radiological Exams on Admission: CT Angio Chest/Abd/Pel for Dissection W and/or Wo Contrast  Result Date: 03/25/2022 CLINICAL DATA:  Midsternal chest pain EXAM: CT ANGIOGRAPHY CHEST, ABDOMEN AND PELVIS TECHNIQUE: Non-contrast CT of the chest was initially obtained. Multidetector CT imaging through the chest, abdomen and pelvis was performed using the standard protocol during bolus administration of intravenous contrast. Multiplanar reconstructed images and MIPs were obtained and reviewed to evaluate the vascular anatomy. RADIATION DOSE REDUCTION: This exam was performed according to the departmental dose-optimization program which includes automated exposure control, adjustment of the mA and/or kV according to patient size and/or use of iterative reconstruction technique. CONTRAST:  25m OMNIPAQUE IOHEXOL 350 MG/ML SOLN COMPARISON:  CT abdomen/pelvis dated 02/25/2021. CT chest dated 01/09/2021. FINDINGS: CTA CHEST FINDINGS Cardiovascular: On unenhanced CT, there is no evidence of intramural hematoma. Following contrast administration, there is no evidence of thoracic aortic aneurysm or dissection. Atherosclerotic calcifications of the arch. Although not tailored for evaluation of the pulmonary arteries, there is no evidence of pulmonary embolism to the lobar level. Mild cardiomegaly.  No pericardial effusion. Severe three-vessel coronary atherosclerosis. Mediastinum/Nodes: No suspicious mediastinal lymphadenopathy. Visualized thyroid is unremarkable. Lungs/Pleura: Moderate left and small right pleural effusions. Additional faint ground-glass opacities in the lungs bilaterally, with mild interlobular septal thickening in the upper lobes, favoring mild interstitial  edema. Mild dependent atelectasis in the posterior upper and lower lobes. No focal consolidation. No pneumothorax. Musculoskeletal: Degenerative changes of the mid/lower thoracic spine. Review of the MIP images confirms the above findings. CTA ABDOMEN AND PELVIS FINDINGS VASCULAR Aorta: No evidence of abdominal aortic aneurysm or dissection. Patent. Atherosclerotic calcifications. Celiac: Patent.  Atherosclerotic calcifications. SMA: Patent.  Atherosclerotic calcifications at the origin. Renals: Patent. Atherosclerotic calcifications at the origin bilaterally. IMA: Patent. Inflow: Patent bilaterally.  Atherosclerotic calcifications. Veins: Grossly unremarkable. Review of the MIP images confirms the above findings. NON-VASCULAR Hepatobiliary: Liver is within normal limits. Gallbladder is unremarkable. No intrahepatic or extrahepatic ductal dilatation. Pancreas: Within normal limits. Spleen: Within normal limits. Adrenals/Urinary Tract: Adrenal glands are within normal limits. Bilateral renal cysts, measuring up to 3.2 cm in the left upper kidney (series 5/image 152), benign (Bosniak I). No follow-up is recommended. No hydronephrosis. Bladder is not decompressed with an indwelling suprapubic Foley catheter. Stomach/Bowel: Stomach is within normal limits. No evidence of bowel obstruction. Normal appendix (series 5/image 226). Extensive scattered colonic diverticulosis, without evidence of diverticulitis. Lymphatic: No suspicious abdominopelvic lymphadenopathy. Reproductive: Prostatomegaly, with enlargement of the central gland which indents the base of the bladder, suggesting BPH. Other: Trace pelvic ascites (series 5/image 273). Musculoskeletal: Grade 1 spondylolisthesis at L5-S1, chronic. Mild degenerative changes of the lumbar spine. Review of the MIP images confirms the above findings. IMPRESSION: No evidence of thoracoabdominal aortic aneurysm or dissection. No evidence of pulmonary embolism. Cardiomegaly with  mild interstitial edema. Moderate left and small right pleural effusions. Additional ancillary findings as above. Electronically Signed   By: SJulian HyM.D.   On: 03/25/2022 22:43   DG Chest Port 1 View  Result Date: 03/25/2022 CLINICAL DATA:  Chest pain. EXAM: PORTABLE CHEST 1 VIEW COMPARISON:  Chest radiograph dated 10/03/2021. FINDINGS: There is mild cardiomegaly. Mild vascular congestion and edema. No focal consolidation, pleural effusion, or pneumothorax. Atherosclerotic calcification of the aorta. No acute osseous pathology. IMPRESSION: Cardiomegaly with mild vascular congestion and edema. Electronically Signed   By: AMilas Hock  Radparvar M.D.   On: 03/25/2022 20:41     Data Reviewed: Relevant notes from primary care and specialist visits, past discharge summaries as available in EHR, including Care Everywhere. Prior diagnostic testing as pertinent to current admission diagnoses Updated medications and problem lists for reconciliation ED course, including vitals, labs, imaging, treatment and response to treatment Triage notes, nursing and pharmacy notes and ED provider's notes Notable results as noted in HPI   Assessment and Plan: NSTEMI (non-ST elevated myocardial infarction) ([place]) Frequent PVCs Patient with typical chest pain, known distal 80% RCA occlusion on last cath 2022 Pharmacal stress test December 2023 was high risk but without reversible ischemia Continue heparin infusion Continue nitroglycerin infusion and titrate to pain IV metoprolol Received chewable aspirin 325 in the ED Continue rosuvastatin and daily aspirin Close monitoring in stepdown  Acute on chronic systolic CHF (congestive heart failure) (HCC) Bilateral pleural effusions Patient has been having shortness of breath but without orthopnea, PND or pedal edema Last EF 30 to 35% on nuclear stress test in December BNP elevated and bilateral pleural effusions on chest x-ray and CTA chest IV Lasix Continue  metoprolol Daily weight with intake and output monitoring   Chronic kidney disease, stage 3b (HCC) Renal function at baseline  Suprapubic catheter ([place]) BPH with chronic urinary retention Continue suprapubic Continue Proscar and Cardura  Essential hypertension On IV metoprolol  Essential tremor Continue primidone.  Followed by neurology     DVT prophylaxis: Heparin infusion  Consults: Cardiology consult, Dr. Clayborn Bigness  Advance Care Planning:   Code Status: Prior   Family Communication: Wife at bedside  Disposition Plan: Back to previous home environment  Severity of Illness: The appropriate patient status for this patient is INPATIENT. Inpatient status is judged to be reasonable and necessary in order to provide the required intensity of service to ensure the patient's safety. The patient's presenting symptoms, physical exam findings, and initial radiographic and laboratory data in the context of their chronic comorbidities is felt to place them at high risk for further clinical deterioration. Furthermore, it is not anticipated that the patient will be medically stable for discharge from the hospital within 2 midnights of admission.   * I certify that at the point of admission it is my clinical judgment that the patient will require inpatient hospital care spanning beyond 2 midnights from the point of admission due to high intensity of service, high risk for further deterioration and high frequency of surveillance required.*  Author: Athena Masse, MD 03/25/2022 11:49 PM  For on call review [email or web address].

## 2022-03-25 NOTE — ED Notes (Signed)
Patient transported to CT 

## 2022-03-25 NOTE — ED Provider Notes (Addendum)
[facility] Provider Note    Event Date/Time   First MD Initiated Contact with Patient 03/25/22 2044     (approximate)   History   Chest Pain   HPI  Shawn Andrews is a 89 y.o. male  MI, CAD, CHF, HTN, HLD, DVT, HFrEF, CKD stage 3, and benign prostatic hyperplasia who comes in with chest pain.  On review of records patient was seen by cardiology on 02/25/2022.  That time he had an N STEMI.  A stress test was ordered.  Patient is on aspirin, Plavix.  Had a [employer] that was reassuring.  EF is 30 to 35%.  Patient reports severe pain that started about 1 hour ago.  He reports taking 2 nitro, his acid reducer without any relief in symptoms.  He reports the pain stains up on the left side of his chest does not radiate into his abdomen or into his back denies any numbness or tingling or shortness of breath.  He reports the pain is severe.  He denies any falls and hitting his head.      Physical Exam   Triage Vital Signs: ED Triage Vitals  Enc Vitals Group     BP 03/25/22 2022 (!) 182/95     Pulse Rate 03/25/22 2022 90     Resp 03/25/22 2022 18     Temp 03/25/22 2022 97.6 F (36.4 C)     Temp Source 03/25/22 2022 Oral     SpO2 03/25/22 2022 99 %     Weight 03/25/22 2020 185 lb 10 oz (84.2 kg)     Height 03/25/22 2020 '5\' 10"'$  (1.778 m)     Head Circumference --      Peak Flow --      Pain Score --      Pain Loc --      Pain Edu? --      Excl. in [place]? --     Most recent vital signs: Vitals:   03/25/22 2022  BP: (!) 182/95  Pulse: 90  Resp: 18  Temp: 97.6 F (36.4 C)  SpO2: 99%     General: Awake, no distress.  CV:  Good peripheral perfusion.  Resp:  Normal effort.  Abd:  No distention.  Other:  Good equal radial pulses good equal DP pulses   ED Results / Procedures / Treatments   Labs (all labs ordered are listed, but only abnormal results are displayed) Labs Reviewed  BASIC METABOLIC PANEL - Abnormal; Notable for the following  components:      Result Value   Sodium 133 (*)    CO2 20 (*)    Glucose, Bld 177 (*)    BUN 43 (*)    Creatinine, Ser 1.93 (*)    GFR, Estimated 32 (*)    All other components within normal limits  CBC - Abnormal; Notable for the following components:   RBC 3.90 (*)    Hemoglobin 11.7 (*)    HCT 36.2 (*)    All other components within normal limits  TROPONIN I (HIGH SENSITIVITY) - Abnormal; Notable for the following components:   Troponin I (High Sensitivity) 32 (*)    All other components within normal limits  BRAIN NATRIURETIC PEPTIDE     EKG  My interpretation of EKG:  Sinus rate of 99 without any ST elevations or T wave inversions except for aVL with a little bit of depression in V5 and V6 with occasional PVCs  Repeat EKG  sinus rate of 99 without any ST elevation, similar T wave inversions ST depressions not as apparent with continued PVCs  RADIOLOGY I have reviewed the xray personally and interpreted and patient has some mild cardiomegaly with some edema noted   PROCEDURES:  Critical Care performed: Yes, see critical care procedure note(s)  .1-3 Lead EKG Interpretation  Performed by: Vanessa [place], MD Authorized by: Vanessa [place], MD     Interpretation: abnormal     ECG rate:  60   ECG rate assessment: normal     Rhythm: sinus rhythm     Ectopy: PVCs     Conduction: normal   .Critical Care  Performed by: Vanessa [place], MD Authorized by: Vanessa [place], MD   Critical care provider statement:    Critical care time (minutes):  30   Critical care was necessary to treat or prevent imminent or life-threatening deterioration of the following conditions:  Cardiac failure   Critical care was time spent personally by me on the following activities:  Development of treatment plan with patient or surrogate, discussions with consultants, evaluation of patient's response to treatment, examination of patient, ordering and review of laboratory studies, ordering and review of  radiographic studies, ordering and performing treatments and interventions, pulse oximetry, re-evaluation of patient's condition and review of [facility]: Medications  aspirin chewable tablet 324 mg (has no administration in time range)  nitroGLYCERIN 50 mg in dextrose 5 % 250 mL (0.2 mg/mL) infusion (has no administration in time range)  morphine (PF) 4 MG/ML injection 4 mg (has no administration in time range)  furosemide (LASIX) injection 40 mg (has no administration in time range)  morphine (PF) 4 MG/ML injection 4 mg (4 mg Intravenous Given 03/25/22 2111)  ondansetron (ZOFRAN) injection 4 mg (4 mg Intravenous Given 03/25/22 2111)  morphine (PF) 4 MG/ML injection 4 mg (4 mg Intravenous Given 03/25/22 2210)  iohexol (OMNIPAQUE) 350 MG/ML injection 80 mL (80 mLs Intravenous Contrast Given 03/25/22 2224)     IMPRESSION / MDM / Ward / ED COURSE  I reviewed the triage vital signs and the nursing notes.   Patient's presentation is most consistent with acute presentation with potential threat to life or bodily function.   Differential includes ACS, CHF, PE, dissection.  PE seems less likely given no shortness of breath considered dissection  Labs show stable creatinine of 1.93 may be slightly increased from prior.  CBC shows stable hemoglobin.  Initial troponin slightly elevated at 32.  CT angios in the past without any evidence of thoracic aneurysm  Patient had having significant chest pain even after morphine doses.  Patient will be given a dose of Dilaudid started on nitro drip given some IV Lasix.  CT imaging without evidence of dissection so given aspirin and will start on heparin.  Will let Dr. Clayborn Bigness know about patient.  Will admit to the hospital team for NSTEMI  Given patient's continued pain I did discuss the case with cardiologist Dr. Clayborn Bigness who recommended trying to get him on the nitro and heparin and seeing if this helps with his pain to  manage him overnight however if his symptoms are not getting better to let him know.  I will discuss this case with the hospital team for admission.  The patient is on the cardiac monitor to evaluate for evidence of arrhythmia and/or significant heart rate changes.      FINAL CLINICAL IMPRESSION(S) / ED DIAGNOSES  Final diagnoses:  NSTEMI (non-ST elevated myocardial infarction) ([place])     Rx / DC Orders   ED Discharge Orders     None        Note:  This document was prepared using Dragon voice recognition software and may include unintentional dictation errors.   Vanessa [place], MD 03/25/22 2320    Vanessa [place], MD 03/25/22 [phone]

## 2022-03-25 NOTE — Assessment & Plan Note (Signed)
Continue primidone.  Followed by neurology

## 2022-03-25 NOTE — Progress Notes (Signed)
[facility] for heparin infusion Indication: ACS/STEMI  Allergies  Allergen Reactions   Penicillins Swelling    Of tongue Has patient had a PCN reaction causing immediate rash, facial/tongue/throat swelling, SOB or lightheadedness with hypotension: Yes Has patient had a PCN reaction causing severe rash involving mucus membranes or skin necrosis: No Has patient had a PCN reaction that required hospitalization: No Has patient had a PCN reaction occurring within the last 10 years: No If all of the above answers are "NO", then may proceed with Cephalosporin use.   Cialis [Tadalafil] Other (See Comments)    Indigestion and "felt like crap"   Lipitor [Atorvastatin] Other (See Comments)    Severe myalgias   Lisinopril Cough    Dry nagging cough    Patient Measurements: Height: '5\' 10"'$  (177.8 cm) Weight: 84.2 kg (185 lb 10 oz) IBW/kg (Calculated) : 73 Heparin Dosing Weight: 84.2 kg  Vital Signs: Temp: 97.6 F (36.4 C) (03/04 2022) Temp Source: Oral (03/04 2022) BP: 142/53 (03/04 2215) Pulse Rate: 44 (03/04 2215)  Labs: Recent Labs    03/25/22 2019 03/25/22 2218  HGB 11.7*  --   HCT 36.2*  --   PLT 173  --   CREATININE 1.93*  --   TROPONINIHS 32* 262*    Estimated Creatinine Clearance: 25.7 mL/min (A) (by C-G formula based on SCr of 1.93 mg/dL (H)).   Medical History: Past Medical History:  Diagnosis Date   Abnormal drug screen 06/22/2014   inapprop neg xanax rpt 3 mo (06/2014)   BPH (benign prostatic hypertrophy) 01/21/1998   has had 3 biopsies in past ([facility]) decided to stop PSA/DRE   CHF (congestive heart failure) (HCC)    Coronary artery disease    Hyperlipidemia 01/21/2002   Hypertension 05/22/2003   Left lumbar radiculopathy    Osteoarthritis 01/22/1988   knees, lumbar spondylosis and listhesis    Assessment: Pt is a 89 yo male presenting to ED with h/o MI, c/o mid-sternal CP found with elevated troponin I level,  trending up.  Goal of Therapy:  Heparin level 0.3-0.7 units/ml Monitor platelets by anticoagulation protocol: Yes   Plan:  Bolus 4000 units x 1 Start heparin infusion at 1100 units/hr Will check HL in 8 hr after start of infusion CBC daily while on heparin  Renda Rolls, PharmD, [facility] 03/25/2022 11:29 PM

## 2022-03-25 NOTE — ED Notes (Signed)
Pt placed on 2L [place] after sats dropped post morphine inj/ Md aware

## 2022-03-25 NOTE — ED Notes (Signed)
TROP- 262 Dr. Jari Pigg made aware

## 2022-03-25 NOTE — ED Triage Notes (Signed)
Pt presents ambulatory to triage via POV with complaints of mid-sternal CP that started tonight. Hx of MI and ACS. He notes taking 2 nitros tonight without any improvement in his sx. Rates the pain a 10/10 and has some radiation to bilateral arms. A&Ox4 at this time. Denies cough, chills, fevers, N/V/D, or SOB.

## 2022-03-25 NOTE — Assessment & Plan Note (Signed)
On IV metoprolol

## 2022-03-25 NOTE — Assessment & Plan Note (Addendum)
Frequent PVCs Patient with typical chest pain, known distal 80% RCA occlusion on last cath 2022 Pharmacal stress test December 2023 was high risk but without reversible ischemia Continue heparin infusion Continue nitroglycerin infusion and titrate to pain IV metoprolol Received chewable aspirin 325 in the ED Continue rosuvastatin and daily aspirin Close monitoring in stepdown

## 2022-03-25 NOTE — Assessment & Plan Note (Signed)
Renal function at baseline 

## 2022-03-25 NOTE — Assessment & Plan Note (Addendum)
Bilateral pleural effusions Patient has been having shortness of breath but without orthopnea, PND or pedal edema Last EF 30 to 35% on nuclear stress test in December BNP elevated and bilateral pleural effusions on chest x-ray and CTA chest IV Lasix Continue metoprolol Daily weight with intake and output monitoring

## 2022-03-26 ENCOUNTER — Encounter: Payer: Self-pay | Admitting: Internal Medicine

## 2022-03-26 ENCOUNTER — Inpatient Hospital Stay
Admit: 2022-03-26 | Discharge: 2022-03-26 | Disposition: A | Discharge status: Home / Self Care | Payer: Medicare Other | Attending: Cardiology | Admitting: Cardiology

## 2022-03-26 ENCOUNTER — Other Ambulatory Visit: Payer: Self-pay

## 2022-03-26 DIAGNOSIS — Z79899 Other long term (current) drug therapy: Secondary | ICD-10-CM | POA: Diagnosis not present

## 2022-03-26 DIAGNOSIS — N401 Enlarged prostate with lower urinary tract symptoms: Secondary | ICD-10-CM | POA: Diagnosis present

## 2022-03-26 DIAGNOSIS — Z86718 Personal history of other venous thrombosis and embolism: Secondary | ICD-10-CM | POA: Diagnosis not present

## 2022-03-26 DIAGNOSIS — N1832 Chronic kidney disease, stage 3b: Secondary | ICD-10-CM | POA: Diagnosis present

## 2022-03-26 DIAGNOSIS — I5023 Acute on chronic systolic (congestive) heart failure: Secondary | ICD-10-CM

## 2022-03-26 DIAGNOSIS — I21A9 Other myocardial infarction type: Secondary | ICD-10-CM | POA: Diagnosis present

## 2022-03-26 DIAGNOSIS — I2510 Atherosclerotic heart disease of native coronary artery without angina pectoris: Secondary | ICD-10-CM | POA: Diagnosis present

## 2022-03-26 DIAGNOSIS — Y831 Surgical operation with implant of artificial internal device as the cause of abnormal reaction of the patient, or of later complication, without mention of misadventure at the time of the procedure: Secondary | ICD-10-CM | POA: Diagnosis present

## 2022-03-26 DIAGNOSIS — E785 Hyperlipidemia, unspecified: Secondary | ICD-10-CM | POA: Diagnosis present

## 2022-03-26 DIAGNOSIS — Z7902 Long term (current) use of antithrombotics/antiplatelets: Secondary | ICD-10-CM | POA: Diagnosis not present

## 2022-03-26 DIAGNOSIS — Z808 Family history of malignant neoplasm of other organs or systems: Secondary | ICD-10-CM | POA: Diagnosis not present

## 2022-03-26 DIAGNOSIS — R338 Other retention of urine: Secondary | ICD-10-CM | POA: Diagnosis present

## 2022-03-26 DIAGNOSIS — I255 Ischemic cardiomyopathy: Secondary | ICD-10-CM | POA: Diagnosis present

## 2022-03-26 DIAGNOSIS — I214 Non-ST elevation (NSTEMI) myocardial infarction: Secondary | ICD-10-CM | POA: Diagnosis present

## 2022-03-26 DIAGNOSIS — I493 Ventricular premature depolarization: Secondary | ICD-10-CM | POA: Diagnosis present

## 2022-03-26 DIAGNOSIS — Z7982 Long term (current) use of aspirin: Secondary | ICD-10-CM | POA: Diagnosis not present

## 2022-03-26 DIAGNOSIS — I428 Other cardiomyopathies: Secondary | ICD-10-CM | POA: Diagnosis present

## 2022-03-26 DIAGNOSIS — I130 Hypertensive heart and chronic kidney disease with heart failure and stage 1 through stage 4 chronic kidney disease, or unspecified chronic kidney disease: Secondary | ICD-10-CM | POA: Diagnosis present

## 2022-03-26 DIAGNOSIS — Z8249 Family history of ischemic heart disease and other diseases of the circulatory system: Secondary | ICD-10-CM | POA: Diagnosis not present

## 2022-03-26 DIAGNOSIS — T82855A Stenosis of coronary artery stent, initial encounter: Secondary | ICD-10-CM | POA: Diagnosis present

## 2022-03-26 DIAGNOSIS — Z955 Presence of coronary angioplasty implant and graft: Secondary | ICD-10-CM | POA: Diagnosis not present

## 2022-03-26 DIAGNOSIS — Z1152 Encounter for screening for COVID-19: Secondary | ICD-10-CM | POA: Diagnosis not present

## 2022-03-26 DIAGNOSIS — F17220 Nicotine dependence, chewing tobacco, uncomplicated: Secondary | ICD-10-CM | POA: Diagnosis present

## 2022-03-26 DIAGNOSIS — Z833 Family history of diabetes mellitus: Secondary | ICD-10-CM | POA: Diagnosis not present

## 2022-03-26 DIAGNOSIS — E669 Obesity, unspecified: Secondary | ICD-10-CM | POA: Diagnosis present

## 2022-03-26 DIAGNOSIS — Z936 Other artificial openings of urinary tract status: Secondary | ICD-10-CM | POA: Diagnosis not present

## 2022-03-26 DIAGNOSIS — J9601 Acute respiratory failure with hypoxia: Secondary | ICD-10-CM | POA: Diagnosis present

## 2022-03-26 DIAGNOSIS — G250 Essential tremor: Secondary | ICD-10-CM | POA: Diagnosis not present

## 2022-03-26 LAB — ECHOCARDIOGRAM COMPLETE
AR max vel: 3.1 cm2
AV Area VTI: 3 cm2
AV Area mean vel: 2.85 cm2
AV Mean grad: 3.5 mmHg
AV Peak grad: 7.2 mmHg
Ao pk vel: 1.34 m/s
Area-P 1/2: 4.08 cm2
Height: 70 in
MV VTI: 3.06 cm2
S' Lateral: 4.4 cm
Weight: 2994.73 oz

## 2022-03-26 LAB — BASIC METABOLIC PANEL
Anion gap: 13 (ref 5–15)
BUN: 43 mg/dL — ABNORMAL HIGH (ref 8–23)
CO2: 17 mmol/L — ABNORMAL LOW (ref 22–32)
Calcium: 8.8 mg/dL — ABNORMAL LOW (ref 8.9–10.3)
Chloride: 109 mmol/L (ref 98–111)
Creatinine, Ser: 2.13 mg/dL — ABNORMAL HIGH (ref 0.61–1.24)
GFR, Estimated: 29 mL/min — ABNORMAL LOW (ref >60)
Glucose, Bld: 133 mg/dL — ABNORMAL HIGH (ref 70–99)
Potassium: 5.5 mmol/L — ABNORMAL HIGH (ref 3.5–5.1)
Sodium: 139 mmol/L (ref 135–145)

## 2022-03-26 LAB — PROTIME-INR
INR: 1.1 (ref 0.8–1.2)
Prothrombin Time: 14 seconds (ref 11.4–15.2)

## 2022-03-26 LAB — HEPARIN LEVEL (UNFRACTIONATED)
Heparin Unfractionated: 0.54 IU/mL (ref 0.30–0.70)
Heparin Unfractionated: 0.5 IU/mL (ref 0.30–0.70)

## 2022-03-26 LAB — TROPONIN I (HIGH SENSITIVITY): Troponin I (High Sensitivity): 22486 ng/L (ref <18)

## 2022-03-26 LAB — APTT: aPTT: 27 seconds (ref 24–36)

## 2022-03-26 MED ORDER — NITROGLYCERIN 0.4 MG SL SUBL
0.4000 mg | SUBLINGUAL_TABLET | SUBLINGUAL | Status: DC | PRN
  Administered 2022-03-29 – 2022-03-30 (×2): 0.4 mg via SUBLINGUAL
  Filled 2022-03-26 (×2): qty 1

## 2022-03-26 MED ORDER — VITAMIN B-12 1000 MCG PO TABS
1000.0000 ug | ORAL_TABLET | Freq: Every day | ORAL | Status: DC
  Administered 2022-03-26 – 2022-03-31 (×6): 1000 ug via ORAL
  Filled 2022-03-26 (×6): qty 1

## 2022-03-26 MED ORDER — DOXAZOSIN MESYLATE 4 MG PO TABS
4.0000 mg | ORAL_TABLET | Freq: Every day | ORAL | Status: DC
  Administered 2022-03-26 – 2022-03-30 (×5): 4 mg via ORAL
  Filled 2022-03-26 (×5): qty 1

## 2022-03-26 MED ORDER — ROSUVASTATIN CALCIUM 10 MG PO TABS
20.0000 mg | ORAL_TABLET | Freq: Every day | ORAL | Status: DC
  Administered 2022-03-26 – 2022-03-28 (×3): 20 mg via ORAL
  Filled 2022-03-26 (×3): qty 2

## 2022-03-26 MED ORDER — PANTOPRAZOLE SODIUM 40 MG PO TBEC
40.0000 mg | DELAYED_RELEASE_TABLET | Freq: Two times a day (BID) | ORAL | Status: DC
  Administered 2022-03-26 – 2022-03-31 (×8): 40 mg via ORAL
  Filled 2022-03-26 (×9): qty 1

## 2022-03-26 MED ORDER — SODIUM CHLORIDE 0.9% FLUSH: 3.0000 mL | INTRAVENOUS | Status: DC | PRN

## 2022-03-26 MED ORDER — ISOSORBIDE MONONITRATE ER 60 MG PO TB24
90.0000 mg | ORAL_TABLET | Freq: Every day | ORAL | Status: DC
  Administered 2022-03-26 – 2022-03-30 (×5): 90 mg via ORAL
  Filled 2022-03-26 (×5): qty 1

## 2022-03-26 MED ORDER — FINASTERIDE 5 MG PO TABS
5.0000 mg | ORAL_TABLET | Freq: Every day | ORAL | Status: DC
  Administered 2022-03-26 – 2022-03-31 (×6): 5 mg via ORAL
  Filled 2022-03-26 (×6): qty 1

## 2022-03-26 MED ORDER — METOPROLOL TARTRATE 25 MG PO TABS: 25.0000 mg | ORAL_TABLET | Freq: Two times a day (BID) | ORAL | Status: DC

## 2022-03-26 MED ORDER — METOPROLOL TARTRATE 25 MG PO TABS
12.5000 mg | ORAL_TABLET | Freq: Two times a day (BID) | ORAL | Status: DC
  Administered 2022-03-26 – 2022-03-29 (×6): 12.5 mg via ORAL
  Filled 2022-03-26 (×6): qty 1

## 2022-03-26 MED ORDER — FAMOTIDINE 20 MG PO TABS
20.0000 mg | ORAL_TABLET | Freq: Every day | ORAL | Status: DC
  Administered 2022-03-26 – 2022-03-30 (×5): 20 mg via ORAL
  Filled 2022-03-26 (×5): qty 1

## 2022-03-26 MED ORDER — SODIUM CHLORIDE 0.9% FLUSH
3.0000 mL | Freq: Two times a day (BID) | INTRAVENOUS | Status: DC
  Administered 2022-03-27 – 2022-03-29 (×5): 3 mL via INTRAVENOUS

## 2022-03-26 MED ORDER — SODIUM CHLORIDE 0.9 % IV SOLN: INTRAVENOUS | Status: DC

## 2022-03-26 MED ORDER — FUROSEMIDE 10 MG/ML IJ SOLN
20.0000 mg | Freq: Two times a day (BID) | INTRAMUSCULAR | Status: DC
  Administered 2022-03-26: 20 mg via INTRAVENOUS
  Filled 2022-03-26: qty 2

## 2022-03-26 MED ORDER — ONDANSETRON HCL 4 MG/2ML IJ SOLN: 4.0000 mg | Freq: Four times a day (QID) | INTRAMUSCULAR | Status: DC | PRN

## 2022-03-26 MED ORDER — ACETAMINOPHEN 325 MG PO TABS: 650.0000 mg | ORAL_TABLET | ORAL | Status: DC | PRN

## 2022-03-26 MED ORDER — ASPIRIN 81 MG PO CHEW: 81.0000 mg | CHEWABLE_TABLET | ORAL | Status: DC

## 2022-03-26 MED ORDER — SODIUM CHLORIDE 0.9 % IV SOLN: 250.0000 mL | INTRAVENOUS | Status: DC | PRN

## 2022-03-26 MED ORDER — PRIMIDONE 50 MG PO TABS
100.0000 mg | ORAL_TABLET | Freq: Three times a day (TID) | ORAL | Status: DC
  Administered 2022-03-26 – 2022-03-31 (×14): 100 mg via ORAL
  Filled 2022-03-26 (×15): qty 2

## 2022-03-26 MED ORDER — CLOPIDOGREL BISULFATE 75 MG PO TABS
75.0000 mg | ORAL_TABLET | Freq: Every day | ORAL | Status: DC
  Administered 2022-03-26 – 2022-03-27 (×2): 75 mg via ORAL
  Filled 2022-03-26 (×2): qty 1

## 2022-03-26 MED ORDER — ASPIRIN 81 MG PO CHEW
81.0000 mg | CHEWABLE_TABLET | Freq: Every day | ORAL | Status: DC
  Administered 2022-03-26 – 2022-03-27 (×2): 81 mg via ORAL
  Filled 2022-03-26 (×2): qty 1

## 2022-03-26 NOTE — ED Notes (Signed)
Hospitalist at bedside 

## 2022-03-26 NOTE — Progress Notes (Signed)
[place] for heparin infusion Indication: ACS/STEMI  Allergies  Allergen Reactions   Penicillins Swelling    Of tongue Has patient had a PCN reaction causing immediate rash, facial/tongue/throat swelling, SOB or lightheadedness with hypotension: Yes Has patient had a PCN reaction causing severe rash involving mucus membranes or skin necrosis: No Has patient had a PCN reaction that required hospitalization: No Has patient had a PCN reaction occurring within the last 10 years: No If all of the above answers are "NO", then may proceed with Cephalosporin use.   Cialis [Tadalafil] Other (See Comments)    Indigestion and "felt like crap"   Lipitor [Atorvastatin] Other (See Comments)    Severe myalgias   Lisinopril Cough    Dry nagging cough    Patient Measurements: Height: '5\' 10"'$  (177.8 cm) Weight: 84.9 kg (187 lb 2.7 oz) IBW/kg (Calculated) : 73 Heparin Dosing Weight: 84.2 kg  Vital Signs: Temp: 98.2 F (36.8 C) (03/05 0800) Temp Source: Oral (03/05 0800) BP: 122/62 (03/05 0917) Pulse Rate: 70 (03/05 0917)  Labs: Recent Labs    03/25/22 2019 03/25/22 2218 03/25/22 2344 03/26/22 0904  HGB 11.7*  --   --   --   HCT 36.2*  --   --   --   PLT 173  --   --   --   APTT  --   --  27  --   LABPROT  --   --  14.0  --   INR  --   --  1.1  --   HEPARINUNFRC  --   --   --  0.54  CREATININE 1.93*  --   --   --   TROPONINIHS 32* 262*  --   --     Estimated Creatinine Clearance: 25.7 mL/min (A) (by C-G formula based on SCr of 1.93 mg/dL (H)).  Medical History: Past Medical History:  Diagnosis Date   Abnormal drug screen 06/22/2014   inapprop neg xanax rpt 3 mo (06/2014)   BPH (benign prostatic hypertrophy) 01/21/1998   has had 3 biopsies in past ([facility]) decided to stop PSA/DRE   CHF (congestive heart failure) (HCC)    Coronary artery disease    Hyperlipidemia 01/21/2002   Hypertension 05/22/2003   Left lumbar radiculopathy     Osteoarthritis 01/22/1988   knees, lumbar spondylosis and listhesis    Assessment: Pt is a 89 yo male presenting to ED with h/o MI, c/o mid-sternal CP found with elevated troponin I level, trending up. Pharmacy consulted to manage heparin gtt for ACS  Results: 3/5/'@0904'$  HL=0.54, therapeutic x 1, heparin rate 1100 units/hr  Goal of Therapy:  Heparin level 0.3-0.7 units/ml Monitor platelets by anticoagulation protocol: Yes   Plan:  Continue heparin infusion at 1100 units/hr Check HL in 8 hr to confirm therapeutic rate CBC daily while on heparin  Toure Edmonds Rodriguez-Guzman PharmD, BCPS 03/26/2022 10:06 AM

## 2022-03-26 NOTE — Consult Note (Signed)
[facility] NOTE       Patient ID: Shawn Andrews MRN: [id number] DOB/AGE: 1928-10-20 89 y.o.  Admit date: 03/25/2022 Referring Physician Dr. Marjean Donna Primary Physician Dr. Ola Spurr Primary Cardiologist Shawn Andrews Reason for Consultation NSTEMI  HPI: Shawn Andrews is a 89yoM with a PMH of CAD ([place] 12/2020 with 80% distal RCA, 50% prox RCA, 55% ost-prox Lcx, and 40% or less RPDA and LAD - medically managed), HFrEF (EF 30-35%, with rWMAs 09/2021), HTN, HLD, CKD 3, BPH, essential tremor and urinary retention with suprapubic catheter since 01/2021 who presented to [facility] ED the evening of 03/25/2022 with substernal chest discomfort persisting despite taking 2 SL nitroglycerin at home. Troponins peaked at 22K. Cardiology is consulted for assistance with his NSTEMI.  The patient is well-known to our service from 2 prior hospitalizations and was previously followed by Dr. Nehemiah Massed and more recently by Shawn Andrews.  The patient had a Lexiscan Myoview performed in December 2023 which showed a moderate to large area of anterior apical persistent defect c/w scar without any signs of reversible ischemia. He was seen in clinic most recently  02/25/22 where he was continued on good medical management of his cardiomyopathy    He presented to [facility] ED 3/4 with severe substernal chest discomfort that occurred after he ate a baked potato for dinner. He took a phazyme, and two SL nitro without relief before deciding to present to the ED. No associated nausea, diaphoresis, or shortness of breath. No peripheral edema, heart racing, presynope, or orthopnea. He is active at baseline, going to the gym multiple times weekly and actually did go yesterday AM for about an hour of using the stationary bike and using hand weights. Notably he did not have any anginal symptoms earlier while at the gym.  He does occasionally use PRN nitro at home, sometimes every several days, sometimes only once a week for  substernal chest pain. In the emergency department he was given IV morphine and started on heparin and nitroglycerin infusions with eventual termination of his chest discomfort. He was hypertensive with BP 180/90 and placed on 2L [place]. Labs were notable for initial troponins 32, 262 at 2200 yesterday evening, recheck at 0900 this AM at [id number]. EKG showing NSR and lateral ST depressions.  At my time of evaluation he is sitting upright in bed with his wife at bedside, chest pain free while on nitro gtt.   Review of systems complete and found to be negative unless listed above     Past Medical History:  Diagnosis Date   Abnormal drug screen 06/22/2014   inapprop neg xanax rpt 3 mo (06/2014)   BPH (benign prostatic hypertrophy) 01/21/1998   has had 3 biopsies in past ([facility]) decided to stop PSA/DRE   CHF (congestive heart failure) (HCC)    Coronary artery disease    Hyperlipidemia 01/21/2002   Hypertension 05/22/2003   Left lumbar radiculopathy    Osteoarthritis 01/22/1988   knees, lumbar spondylosis and listhesis    Past Surgical History:  Procedure Laterality Date   CATARACT EXTRACTION  2013   bilateral   ESI Left 08/2014, 11/2014, 08/2015   L S1, L L5/S1 transforaminal ESI; L4/5 L5/S1 zygapophysial injections, L S1 transforaminal ([facility])   FINGER SURGERY     right middle, partial traumatic amputation   IR CATHETER TUBE CHANGE  03/02/2021   KNEE ARTHROSCOPY  1990   right   KNEE SURGERY  04/2006   right partial knee replacement in [place] -  rec ppx abx for any invasive procedure   LEFT HEART CATH AND CORONARY ANGIOGRAPHY N/A 01/18/2021   Procedure: LEFT HEART CATH AND CORONARY ANGIOGRAPHY;  Surgeon: Corey Skains, MD;  Location: [facility];  Service: Cardiovascular;  Laterality: N/A;    Medications Prior to Admission  Medication Sig Dispense Refill Last Dose   acetaminophen (TYLENOL 8 HOUR) 650 MG CR tablet Take 1 tablet (650 mg total) by mouth 2 (two) times a day.       aspirin 81 MG chewable tablet Chew 1 tablet by mouth daily.      Cholecalciferol (VITAMIN D3) 25 MCG (1000 UT) CAPS Take 1 capsule (1,000 Units total) by mouth daily. 30 capsule     clopidogrel (PLAVIX) 75 MG tablet Take 1 tablet by mouth daily.      cyanocobalamin 1000 MCG tablet Take 1,000 mcg by mouth daily.      docusate sodium (COLACE) 100 MG capsule Take 1 capsule (100 mg total) by mouth daily.      doxazosin (CARDURA) 4 MG tablet TAKE 1 TABLET BY MOUTH  DAILY 90 tablet 3    famotidine (PEPCID) 20 MG tablet TAKE 1 TABLET BY MOUTH AT  BEDTIME 90 tablet 3    finasteride (PROSCAR) 5 MG tablet TAKE 1 TABLET BY MOUTH  DAILY 90 tablet 3    furosemide (LASIX) 20 MG tablet Take 1 tablet by mouth every other day.      hydrALAZINE (APRESOLINE) 25 MG tablet Take by mouth.      isosorbide mononitrate (IMDUR) 60 MG 24 hr tablet Take 1 tablet (60 mg total) by mouth daily. 30 tablet 2    loratadine (CLARITIN) 10 MG tablet Take 1 tablet (10 mg total) by mouth daily as needed for allergies. 30 tablet     metoprolol tartrate (LOPRESSOR) 50 MG tablet Take 0.5 tablets (25 mg total) by mouth 2 (two) times daily.      nitroGLYCERIN (NITROSTAT) 0.4 MG SL tablet Place under the tongue.      pantoprazole (PROTONIX) 40 MG tablet Take by mouth.      polyethylene glycol powder (GLYCOLAX/MIRALAX) 17 GM/SCOOP powder Take 8.5-17 g by mouth daily as needed for moderate constipation. 3350 g 0    primidone (MYSOLINE) 50 MG tablet Take 2 tablets (100 mg total) by mouth 3 (three) times daily.      rosuvastatin (CRESTOR) 20 MG tablet Take 1 tablet (20 mg total) by mouth at bedtime. 30 tablet 1    Social History   Socioeconomic History   Marital status: Married    Spouse name: Not on file   Number of children: Not on file   Years of education: Not on file   Highest education level: Not on file  Occupational History   Occupation: retired Event organiser, Facilities manager, owns lawn care business  Tobacco Use    Smoking status: Former    Packs/day: 0.00    Years: 0.00    Total pack years: 0.00    Types: Pipe, Cigarettes    Quit date: 12/26/2000    Years since quitting: 21.2    Passive exposure: Past   Smokeless tobacco: Current    Types: Chew   Tobacco comments:    quit over 30 years ago  Vaping Use   Vaping Use: Never used  Substance and Sexual Activity   Alcohol use: Yes    Alcohol/week: 0.0 standard drinks of alcohol    Comment: Occasional beer   Drug use: No  Sexual activity: Not on file  Other Topics Concern   Not on file  Social History Narrative   Widowed, remarried 2007-07-03.   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 [employer]    Activity: walks 1.5 miles 3d/wk   Diet: good water, fruits/vegetables daily   Social Determinants of Health   Financial Resource Strain: Not on file  Food Insecurity: No Food Insecurity (10/04/2021)   Hunger Vital Sign    Worried About Running Out of Food in the Last Year: Never true    [place] in the Last Year: Never true  Transportation Needs: No Transportation Needs (10/04/2021)   PRAPARE - Hydrologist (Medical): No    Lack of Transportation (Non-Medical): No  Physical Activity: Not on file  Stress: Not on file  Social Connections: Not on file  Intimate Partner Violence: Not At Risk (10/04/2021)   Humiliation, Afraid, Rape, and Kick questionnaire    Fear of Current or Ex-Partner: No    Emotionally Abused: No    Physically Abused: No    Sexually Abused: No    Family History  Problem Relation Age of Onset   Hypertension Mother    Heart disease Brother 34       MI   Diabetes Brother    Cancer Daughter        melanoma   Stroke Neg Hx       Intake/Output Summary (Last 24 hours) at 03/26/2022 0834 Last data filed at 03/26/2022 0515 Gross per 24 hour  Intake --  Output 300 ml  Net -300 ml    Vitals:   03/26/22  0415 03/26/22 0428 03/26/22 0430 03/26/22 0500  BP: (!) 106/54  (!) 108/54   Pulse: 62  62   Resp: 15  15   Temp:  98.3 F (36.8 C)    TempSrc:  Oral    SpO2: 95%  95%   Weight:    84.9 kg  Height:        PHYSICAL EXAM General: Pleasant elderly Caucasian male, well nourished, in no acute distress.  Sitting at incline in bed, wife at bedside. HEENT:  Normocephalic and atraumatic. Neck:  No JVD.  Lungs: Normal respiratory effort on oxygen by nasal cannula.  Decreased breath sounds with trace crackles, no wheezing Heart: HRRR with frequent premature beats. Normal S1 and S2 without gallops or murmurs.  Abdomen: Non-distended appearing. Msk: Normal strength and tone for age. Extremities: Warm and well perfused. No clubbing, cyanosis. Trace bilaterl peripheral edema.  Neuro: Alert and oriented X 3. Psych:  Answers questions appropriately.   Labs: Basic Metabolic Panel: Recent Labs    03/25/22 2019  NA 133*  K 4.6  CL 104  CO2 20*  GLUCOSE 177*  BUN 43*  CREATININE 1.93*  CALCIUM 8.9   Liver Function Tests: Recent Labs    03/25/22 2019  AST 18  ALT 12  ALKPHOS 68  BILITOT 0.4  PROT 6.8  ALBUMIN 4.4   Recent Labs    03/25/22 2019  LIPASE 46   CBC: Recent Labs    03/25/22 2019  WBC 9.1  HGB 11.7*  HCT 36.2*  MCV 92.8  PLT 173   Cardiac Enzymes: Recent Labs    03/25/22 2019 03/25/22 2218  TROPONINIHS 32* 262*   BNP: Recent Labs    03/25/22 2019  BNP 598.2*   D-Dimer: No results for input(s): "  DDIMER" in the last 72 hours. Hemoglobin A1C: No results for input(s): "HGBA1C" in the last 72 hours. Fasting Lipid Panel: No results for input(s): "CHOL", "HDL", "LDLCALC", "TRIG", "CHOLHDL", "LDLDIRECT" in the last 72 hours. Thyroid Function Tests: No results for input(s): "TSH", "T4TOTAL", "T3FREE", "THYROIDAB" in the last 72 hours.  Invalid input(s): "FREET3" Anemia Panel: No results for input(s): "VITAMINB12", "FOLATE", "FERRITIN", "TIBC",  "IRON", "RETICCTPCT" in the last 72 hours.   Radiology: CT Angio Chest/Abd/Pel for Dissection W and/or Wo Contrast  Result Date: 03/25/2022 CLINICAL DATA:  Midsternal chest pain EXAM: CT ANGIOGRAPHY CHEST, ABDOMEN AND PELVIS TECHNIQUE: Non-contrast CT of the chest was initially obtained. Multidetector CT imaging through the chest, abdomen and pelvis was performed using the standard protocol during bolus administration of intravenous contrast. Multiplanar reconstructed images and MIPs were obtained and reviewed to evaluate the vascular anatomy. RADIATION DOSE REDUCTION: This exam was performed according to the departmental dose-optimization program which includes automated exposure control, adjustment of the mA and/or kV according to patient size and/or use of iterative reconstruction technique. CONTRAST:  25m OMNIPAQUE IOHEXOL 350 MG/ML SOLN COMPARISON:  CT abdomen/pelvis dated 02/25/2021. CT chest dated 01/09/2021. FINDINGS: CTA CHEST FINDINGS Cardiovascular: On unenhanced CT, there is no evidence of intramural hematoma. Following contrast administration, there is no evidence of thoracic aortic aneurysm or dissection. Atherosclerotic calcifications of the arch. Although not tailored for evaluation of the pulmonary arteries, there is no evidence of pulmonary embolism to the lobar level. Mild cardiomegaly.  No pericardial effusion. Severe three-vessel coronary atherosclerosis. Mediastinum/Nodes: No suspicious mediastinal lymphadenopathy. Visualized thyroid is unremarkable. Lungs/Pleura: Moderate left and small right pleural effusions. Additional faint ground-glass opacities in the lungs bilaterally, with mild interlobular septal thickening in the upper lobes, favoring mild interstitial edema. Mild dependent atelectasis in the posterior upper and lower lobes. No focal consolidation. No pneumothorax. Musculoskeletal: Degenerative changes of the mid/lower thoracic spine. Review of the MIP images confirms the above  findings. CTA ABDOMEN AND PELVIS FINDINGS VASCULAR Aorta: No evidence of abdominal aortic aneurysm or dissection. Patent. Atherosclerotic calcifications. Celiac: Patent.  Atherosclerotic calcifications. SMA: Patent.  Atherosclerotic calcifications at the origin. Renals: Patent. Atherosclerotic calcifications at the origin bilaterally. IMA: Patent. Inflow: Patent bilaterally.  Atherosclerotic calcifications. Veins: Grossly unremarkable. Review of the MIP images confirms the above findings. NON-VASCULAR Hepatobiliary: Liver is within normal limits. Gallbladder is unremarkable. No intrahepatic or extrahepatic ductal dilatation. Pancreas: Within normal limits. Spleen: Within normal limits. Adrenals/Urinary Tract: Adrenal glands are within normal limits. Bilateral renal cysts, measuring up to 3.2 cm in the left upper kidney (series 5/image 152), benign (Bosniak I). No follow-up is recommended. No hydronephrosis. Bladder is not decompressed with an indwelling suprapubic Foley catheter. Stomach/Bowel: Stomach is within normal limits. No evidence of bowel obstruction. Normal appendix (series 5/image 226). Extensive scattered colonic diverticulosis, without evidence of diverticulitis. Lymphatic: No suspicious abdominopelvic lymphadenopathy. Reproductive: Prostatomegaly, with enlargement of the central gland which indents the base of the bladder, suggesting BPH. Other: Trace pelvic ascites (series 5/image 273). Musculoskeletal: Grade 1 spondylolisthesis at L5-S1, chronic. Mild degenerative changes of the lumbar spine. Review of the MIP images confirms the above findings. IMPRESSION: No evidence of thoracoabdominal aortic aneurysm or dissection. No evidence of pulmonary embolism. Cardiomegaly with mild interstitial edema. Moderate left and small right pleural effusions. Additional ancillary findings as above. Electronically Signed   By: SJulian HyM.D.   On: 03/25/2022 22:43   DG Chest Port 1 View  Result Date:  03/25/2022 CLINICAL DATA:  Chest pain. EXAM: PORTABLE CHEST  1 VIEW COMPARISON:  Chest radiograph dated 10/03/2021. FINDINGS: There is mild cardiomegaly. Mild vascular congestion and edema. No focal consolidation, pleural effusion, or pneumothorax. Atherosclerotic calcification of the aorta. No acute osseous pathology. IMPRESSION: Cardiomegaly with mild vascular congestion and edema. Electronically Signed   By: Anner Crete M.D.   On: 03/25/2022 20:41    ECHO 09/2021  1. Left ventricular ejection fraction, by estimation, is 30 to 35%. The  left ventricle has moderately decreased function. The left ventricle  demonstrates regional wall motion abnormalities (see scoring  diagram/findings for description). Left ventricular   diastolic parameters were normal.   2. Right ventricular systolic function is normal. The right ventricular  size is normal.   3. The mitral valve is normal in structure. Mild mitral valve  regurgitation. No evidence of mitral stenosis.   4. The aortic valve is normal in structure. Aortic valve regurgitation is  mild. No aortic stenosis is present.   5. The inferior vena cava is normal in size with greater than 50%  respiratory variability, suggesting right atrial pressure of 3 mmHg.   Leane Call 01/08/22 Impression   Abnormal myocardial perfusion scan left ventricular enlargement globally depressed ejection fraction of 35% there is a moderate to large area of anterior apical persistent defect consistent with scar there is also RV uptake consistent with systolic cardiomyopathy.  No clear evidence of reversible ischemia.  Conclusion this is a high risk study  TELEMETRY reviewed by me (LT) 03/26/2022 : sinus brady to NSR rate 50s - 60s with frequent PVCs, occasionally in a pattern of bigeminy.   EKG reviewed by me: NSR lateral ST depressions  Data reviewed by me (LT) 03/26/2022: ed note, admission H&P last 24h vitals tele labs imaging I/O    Principal Problem:    NSTEMI (non-ST elevated myocardial infarction) [facility]) Active Problems:   Essential hypertension   Essential tremor   Chronic kidney disease, stage 3b (HCC)   Memory deficit   HFrEF (heart failure with reduced ejection fraction) (HCC)   Acute on chronic systolic CHF (congestive heart failure) ([facility])   Suprapubic catheter ([place])   Frequent PVCs    ASSESSMENT AND PLAN:  Shawn Andrews is a 72yoM with a PMH of CAD ([place] 12/2020 with 80% distal RCA, 50% prox RCA, 55% ost-prox Lcx, and 40% or less RPDA and LAD - medically managed), HFrEF (EF 30-35%, with rWMAs 09/2021), HTN, HLD, CKD 3, BPH, essential tremor and urinary retention with suprapubic catheter since 01/2021 who presented to [facility] ED the evening of 03/25/2022 with substernal chest discomfort persisting despite taking 2 SL nitroglycerin at home. Troponins peaked at 22K. Cardiology is consulted for assistance with his NSTEMI.  # NSTEMI  Presented with severe substernal chest discomfort after eating a baked potato for dinner on 3/4, eventual relief with nitro and heparin infusion started in the emergency department.  EKG with lateral ST depressions, troponin with current peak 22,400, concerning for non-STEMI.  Chest pain-free at my time of evaluation while on a nitro gtt. -S/p 325 mg aspirin, 81 mg aspirin and clopidogrel 75 mg daily -Continue heparin drip until LHC -Continue metoprolol tartrate 12.5 mg twice daily -Titrate off nitroglycerin infusion, restart Imdur 90 mg daily -Consider addition of ranexa  -echo complete -discussed the risks and benefits of proceeding with LHC for further evaluation with the patient and his wife. They are agreeable to proceed. Plan for LHC with Shawn Andrews at 12:30pm on 3/6, further recommendations based on results. NPO after midnight except for  sips with meds 3/6  # acute on chronic HFrEF (30-35%, rWMAs) Not clinically hypervolemic on exam, bnp slightly up at 600. S/p IV lasix '40mg'$  x1 and '20mg'$  x 1.  -hold  further diuresis for now - continue metoprolol 12.'5mg'$  BID, consider ARB/MRA/SGLT2i as renal function and BP allow.   # CKD 3 BUN/Cr on admission 43/1.93 and GFR 32, which is slightly up from baseline. Continue to monitor closely.   This patient's plan of care was discussed and created with Shawn Andrews and he is in agreement.  Signed: Tristan Schroeder , PA-C 03/26/2022, 8:34 AM [facility]

## 2022-03-26 NOTE — Progress Notes (Signed)
[place] for heparin infusion Indication: ACS/STEMI  Allergies  Allergen Reactions   Penicillins Swelling    Of tongue Has patient had a PCN reaction causing immediate rash, facial/tongue/throat swelling, SOB or lightheadedness with hypotension: Yes Has patient had a PCN reaction causing severe rash involving mucus membranes or skin necrosis: No Has patient had a PCN reaction that required hospitalization: No Has patient had a PCN reaction occurring within the last 10 years: No If all of the above answers are "NO", then may proceed with Cephalosporin use.   Cialis [Tadalafil] Other (See Comments)    Indigestion and "felt like crap"   Lipitor [Atorvastatin] Other (See Comments)    Severe myalgias   Lisinopril Cough    Dry nagging cough    Patient Measurements: Height: '5\' 10"'$  (177.8 cm) Weight: 84.9 kg (187 lb 2.7 oz) IBW/kg (Calculated) : 73 Heparin Dosing Weight: 84.2 kg  Vital Signs: Temp: 97.8 F (36.6 C) (03/05 1200) Temp Source: Oral (03/05 0800) BP: 117/53 (03/05 1200) Pulse Rate: 60 (03/05 1200)  Labs: Recent Labs    03/25/22 2019 03/25/22 2218 03/25/22 2344 03/26/22 0904 03/26/22 1211 03/26/22 1645  HGB 11.7*  --   --   --   --   --   HCT 36.2*  --   --   --   --   --   PLT 173  --   --   --   --   --   APTT  --   --  27  --   --   --   LABPROT  --   --  14.0  --   --   --   INR  --   --  1.1  --   --   --   HEPARINUNFRC  --   --   --  0.54  --  0.50  CREATININE 1.93*  --   --   --  2.13*  --   TROPONINIHS 32* 262*  --  22,486*  --   --     Estimated Creatinine Clearance: 23.3 mL/min (A) (by C-G formula based on SCr of 2.13 mg/dL (H)).  Medical History: Past Medical History:  Diagnosis Date   Abnormal drug screen 06/22/2014   inapprop neg xanax rpt 3 mo (06/2014)   BPH (benign prostatic hypertrophy) 01/21/1998   has had 3 biopsies in past ([facility]) decided to stop PSA/DRE   CHF (congestive heart failure)  (HCC)    Coronary artery disease    Hyperlipidemia 01/21/2002   Hypertension 05/22/2003   Left lumbar radiculopathy    Osteoarthritis 01/22/1988   knees, lumbar spondylosis and listhesis    Assessment: Pt is a 89 yo male presenting to ED with h/o MI, c/o mid-sternal CP found with elevated troponin I level, trending up. Pharmacy consulted to manage heparin gtt for ACS  Results: 3/5/'@0904'$  HL=0.54, therapeutic x 1, heparin rate 1100 units/hr 3/5 @ 1645 HL = 0.50, therapeutic x 2   Goal of Therapy:  Heparin level 0.3-0.7 units/ml Monitor platelets by anticoagulation protocol: Yes   Plan:  Heparin remains therapeutic  Continue heparin infusion at 1100 units/hr Check HL with daily labs CBC daily while on heparin  Dorothe Pea, PharmD, BCPS Clinical Pharmacist   03/26/2022 5:15 PM

## 2022-03-26 NOTE — Consult Note (Signed)
Heart Failure Nurse Navigator Note  HFrEF 30 to 35% by echocardiogram performed in September 2023.  Echocardiogram performed on this admission results are currently pending.  He presented to the emergency room with complaints of chest pain unrelieved by nitroglycerin.  BNP 598.  Chest x-ray revealed mild pulmonary edema.  Moderate left pleural effusion.  Small right pleural effusion.  Comorbidities:  Coronary artery disease Hyperlipidemia Hypertension Osteoarthritis   Medications:  Aspirin 81 mg daily Plavix 75 mg daily, Cardura 4 mg daily Furosemide 20 mg IV every 12 hours Isosorbide mononitrate 90 mg daily Metoprolol tartrate 12.5 mg 2 times a day Crestor 20 mg daily  Labs:  Troponin level peaked 22,486, sodium 133, potassium 4.6, chloride 104, CO2 20, BUN 43, creatinine 1.93, estimated GFR 32. It is 89.4 kg Intake not documented Output 300 mL   Initial meeting with patient.  There were family members present at the bedside.  States that he weighs himself every day at home and weight varies from 179-182 pounds.   He and his wife do not use salt and they are very low sodium conscious.   with a fluid restriction of no more than 64 ounces.  He continues to work out at a [employer], infact had been at the gym earlier in the day before admission.  Pricilla Riffle RN [facility]

## 2022-03-26 NOTE — Progress Notes (Addendum)
Progress Note    HOSEA Andrews  [id number] DOB: 29-Aug-1928  DOA: 03/25/2022 PCP: Leonel Ramsay, MD      Brief Narrative:    Medical records reviewed and are as summarized below:  Shawn Andrews is a 89 y.o. male with medical history significant for CAD s/p prior stents, HFrEF (EF 30 to 35% 10/04/2021), BPH, HTN, , essential tremor and BPH with chronic urinary retention s/p suprapubic catheter  who presents to the ED with substernal chest pain unrelieved after nitroglycerin and antacids at home.  Patient was hospitalized back in September 2023 with NSTEMI with troponin peak in the 900s and was treated medically with 48 hours of IV heparin and medication optimization.  He had a follow-up myocardial perfusion scan in December 2023 that showed no clear evidence of reversible ischemia but with the conclusion that it was a high risk study.  His last cath was in December 2022 that showed a distal 80% RCA lesion with no stent placement at the time.  Patient was last seen by his cardiologist on 02/25/2022 and was stable at the time, without chest pain but with occasional shortness of breath.  He presented to the hospital with chest pain that was not relieved with nitroglycerin.  His troponins were elevated, went from 32 to 260 to 22,486.  He was admitted to the hospital for acute NSTEMI and acute exacerbation of chronic systolic CHF.  He was treated with IV heparin drip and IV Lasix.         Assessment/Plan:   Principal Problem:   NSTEMI (non-ST elevated myocardial infarction) ([place]) Active Problems:   Acute on chronic systolic CHF (congestive heart failure) (HCC)   Frequent PVCs   HFrEF (heart failure with reduced ejection fraction) (HCC)   Chronic kidney disease, stage 3b (HCC)   Suprapubic catheter ([place])   Essential hypertension   Essential tremor   Memory deficit    Body mass index is 26.86 kg/m.   Acute NSTEMI in a patient with underlying CAD: Troponins went from 32  to 22,486.  Continue IV heparin drip and monitor heparin level per protocol.  Continue aspirin, Plavix and rosuvastatin.  Plan for radial cath tomorrow.  Follow-up with cardiologist.   Acute exacerbation of chronic systolic CHF: Continue IV Lasix.  Monitor BMP, daily weight and urine output.   Acute hypoxic respiratory failure: Continue 3 L/min oxygen via [place].  Wean off oxygen as able.   BPH with chronic urinary retention s/p suprapubic catheter: Continue Proscar and Cardura   Other comorbidities include CKD stage IIIb, essential tremor, hypertension, memory impairment   Diet Order             Diet NPO time specified  Diet effective midnight           Diet 2 gram sodium Fluid consistency: Thin  Diet effective now                            Consultants: Cardiologist  Procedures: Plan for cardiac cath tomorrow    Medications:    aspirin  81 mg Oral Daily   clopidogrel  75 mg Oral Daily   cyanocobalamin  1,000 mcg Oral Daily   doxazosin  4 mg Oral Daily   famotidine  20 mg Oral QHS   finasteride  5 mg Oral Daily   isosorbide mononitrate  90 mg Oral Daily   metoprolol tartrate  12.5 mg Oral BID  pantoprazole  40 mg Oral BID WC   primidone  100 mg Oral TID   rosuvastatin  20 mg Oral QHS   Continuous Infusions:  heparin 1,100 Units/hr (03/26/22 0041)     Anti-infectives (From admission, onward)    None              Family Communication/Anticipated D/C date and plan/Code Status   DVT prophylaxis:      Code Status: Full Code  Family Communication: Plan discussed with his wife at the bedside Disposition Plan: Plan to discharge home in 2 to 3 days   Status is: Inpatient Remains inpatient appropriate because: NSTEMI       Subjective:   Interval events noted.  No chest pain nausea close of breath.  He said he had significant chest pain yesterday.  Chest pain has resolved.  He feels better.  His wife was at the  bedside.  Objective:    Vitals:   03/26/22 0500 03/26/22 0700 03/26/22 0800 03/26/22 0917  BP:  (!) 93/59 (!) 104/53 122/62  Pulse:  (!) 59 (!) 56 70  Resp:  15 18   Temp:   98.2 F (36.8 C)   TempSrc:   Oral   SpO2:  95% 97%   Weight: 84.9 kg     Height:       No data found.   Intake/Output Summary (Last 24 hours) at 03/26/2022 1124 Last data filed at 03/26/2022 1034 Gross per 24 hour  Intake 240 ml  Output 900 ml  Net -660 ml   Filed Weights   03/25/22 2020 03/26/22 0500  Weight: 84.2 kg 84.9 kg    Exam:  GEN: NAD SKIN: No rash EYES: EOMI ENT: MMM CV: RRR PULM: CTA B ABD: soft, ND, NT, +BS CNS: AAO x 3, non focal EXT: No edema or tenderness GU: Suprapubic catheter in place       Data Reviewed:   I have personally reviewed following labs and imaging studies:  Labs: Labs show the following:   Basic Metabolic Panel: Recent Labs  Lab 03/25/22 2019  NA 133*  K 4.6  CL 104  CO2 20*  GLUCOSE 177*  BUN 43*  CREATININE 1.93*  CALCIUM 8.9   GFR Estimated Creatinine Clearance: 25.7 mL/min (A) (by C-G formula based on SCr of 1.93 mg/dL (H)). Liver Function Tests: Recent Labs  Lab 03/25/22 2019  AST 18  ALT 12  ALKPHOS 68  BILITOT 0.4  PROT 6.8  ALBUMIN 4.4   Recent Labs  Lab 03/25/22 2019  LIPASE 46   No results for input(s): "AMMONIA" in the last 168 hours. Coagulation profile Recent Labs  Lab 03/25/22 2344  INR 1.1    CBC: Recent Labs  Lab 03/25/22 2019  WBC 9.1  HGB 11.7*  HCT 36.2*  MCV 92.8  PLT 173   Cardiac Enzymes: No results for input(s): "CKTOTAL", "CKMB", "CKMBINDEX", "TROPONINI" in the last 168 hours. BNP (last 3 results) No results for input(s): "PROBNP" in the last 8760 hours. CBG: No results for input(s): "GLUCAP" in the last 168 hours. D-Dimer: No results for input(s): "DDIMER" in the last 72 hours. Hgb A1c: No results for input(s): "HGBA1C" in the last 72 hours. Lipid Profile: No results for  input(s): "CHOL", "HDL", "LDLCALC", "TRIG", "CHOLHDL", "LDLDIRECT" in the last 72 hours. Thyroid function studies: No results for input(s): "TSH", "T4TOTAL", "T3FREE", "THYROIDAB" in the last 72 hours.  Invalid input(s): "FREET3" Anemia work up: No results for input(s): "VITAMINB12", "FOLATE", "FERRITIN", "TIBC", "  IRON", "RETICCTPCT" in the last 72 hours. Sepsis Labs: Recent Labs  Lab 03/25/22 2019  WBC 9.1    Microbiology Recent Results (from the past 240 hour(s))  Resp panel by RT-PCR (RSV, Flu A&B, Covid) Anterior Nasal Swab     Status: None   Collection Time: 03/25/22 10:13 PM   Specimen: Anterior Nasal Swab  Result Value Ref Range Status   SARS Coronavirus 2 by RT PCR NEGATIVE NEGATIVE Final    Comment: (NOTE) SARS-CoV-2 target nucleic acids are NOT DETECTED.  The SARS-CoV-2 RNA is generally detectable in upper respiratory specimens during the acute phase of infection. The lowest concentration of SARS-CoV-2 viral copies this assay can detect is 138 copies/mL. A negative result does not preclude SARS-Cov-2 infection and should not be used as the sole basis for treatment or other patient management decisions. A negative result may occur with  improper specimen collection/handling, submission of specimen other than nasopharyngeal swab, presence of viral mutation(s) within the areas targeted by this assay, and inadequate number of viral copies(<138 copies/mL). A negative result must be combined with clinical observations, patient history, and epidemiological information. The expected result is Negative.  Fact Sheet for Patients:  EntrepreneurPulse.com.au  Fact Sheet for Healthcare Providers:  IncredibleEmployment.be  This test is no t yet approved or cleared by the [place] FDA and  has been authorized for detection and/or diagnosis of SARS-CoV-2 by FDA under an Emergency Use Authorization (EUA). This EUA will remain  in effect  (meaning this test can be used) for the duration of the COVID-19 declaration under Section 564(b)(1) of the Act, 21 U.S.C.section 360bbb-3(b)(1), unless the authorization is terminated  or revoked sooner.       Influenza A by PCR NEGATIVE NEGATIVE Final   Influenza B by PCR NEGATIVE NEGATIVE Final    Comment: (NOTE) The Xpert Xpress SARS-CoV-2/FLU/RSV plus assay is intended as an aid in the diagnosis of influenza from Nasopharyngeal swab specimens and should not be used as a sole basis for treatment. Nasal washings and aspirates are unacceptable for Xpert Xpress SARS-CoV-2/FLU/RSV testing.  Fact Sheet for Patients: EntrepreneurPulse.com.au  Fact Sheet for Healthcare Providers: IncredibleEmployment.be  This test is not yet approved or cleared by the [place] FDA and has been authorized for detection and/or diagnosis of SARS-CoV-2 by FDA under an Emergency Use Authorization (EUA). This EUA will remain in effect (meaning this test can be used) for the duration of the COVID-19 declaration under Section 564(b)(1) of the Act, 21 U.S.C. section 360bbb-3(b)(1), unless the authorization is terminated or revoked.     Resp Syncytial Virus by PCR NEGATIVE NEGATIVE Final    Comment: (NOTE) Fact Sheet for Patients: EntrepreneurPulse.com.au  Fact Sheet for Healthcare Providers: IncredibleEmployment.be  This test is not yet approved or cleared by the [place] FDA and has been authorized for detection and/or diagnosis of SARS-CoV-2 by FDA under an Emergency Use Authorization (EUA). This EUA will remain in effect (meaning this test can be used) for the duration of the COVID-19 declaration under Section 564(b)(1) of the Act, 21 U.S.C. section 360bbb-3(b)(1), unless the authorization is terminated or revoked.  Performed at [facility], [address] [place], [place] [address]     Procedures  and diagnostic studies:  CT Angio Chest/Abd/Pel for Dissection W and/or Wo Contrast  Result Date: 03/25/2022 CLINICAL DATA:  Midsternal chest pain EXAM: CT ANGIOGRAPHY CHEST, ABDOMEN AND PELVIS TECHNIQUE: Non-contrast CT of the chest was initially obtained. Multidetector CT imaging through the chest, abdomen and pelvis  was performed using the standard protocol during bolus administration of intravenous contrast. Multiplanar reconstructed images and MIPs were obtained and reviewed to evaluate the vascular anatomy. RADIATION DOSE REDUCTION: This exam was performed according to the departmental dose-optimization program which includes automated exposure control, adjustment of the mA and/or kV according to patient size and/or use of iterative reconstruction technique. CONTRAST:  21m OMNIPAQUE IOHEXOL 350 MG/ML SOLN COMPARISON:  CT abdomen/pelvis dated 02/25/2021. CT chest dated 01/09/2021. FINDINGS: CTA CHEST FINDINGS Cardiovascular: On unenhanced CT, there is no evidence of intramural hematoma. Following contrast administration, there is no evidence of thoracic aortic aneurysm or dissection. Atherosclerotic calcifications of the arch. Although not tailored for evaluation of the pulmonary arteries, there is no evidence of pulmonary embolism to the lobar level. Mild cardiomegaly.  No pericardial effusion. Severe three-vessel coronary atherosclerosis. Mediastinum/Nodes: No suspicious mediastinal lymphadenopathy. Visualized thyroid is unremarkable. Lungs/Pleura: Moderate left and small right pleural effusions. Additional faint ground-glass opacities in the lungs bilaterally, with mild interlobular septal thickening in the upper lobes, favoring mild interstitial edema. Mild dependent atelectasis in the posterior upper and lower lobes. No focal consolidation. No pneumothorax. Musculoskeletal: Degenerative changes of the mid/lower thoracic spine. Review of the MIP images confirms the above findings. CTA ABDOMEN AND PELVIS  FINDINGS VASCULAR Aorta: No evidence of abdominal aortic aneurysm or dissection. Patent. Atherosclerotic calcifications. Celiac: Patent.  Atherosclerotic calcifications. SMA: Patent.  Atherosclerotic calcifications at the origin. Renals: Patent. Atherosclerotic calcifications at the origin bilaterally. IMA: Patent. Inflow: Patent bilaterally.  Atherosclerotic calcifications. Veins: Grossly unremarkable. Review of the MIP images confirms the above findings. NON-VASCULAR Hepatobiliary: Liver is within normal limits. Gallbladder is unremarkable. No intrahepatic or extrahepatic ductal dilatation. Pancreas: Within normal limits. Spleen: Within normal limits. Adrenals/Urinary Tract: Adrenal glands are within normal limits. Bilateral renal cysts, measuring up to 3.2 cm in the left upper kidney (series 5/image 152), benign (Bosniak I). No follow-up is recommended. No hydronephrosis. Bladder is not decompressed with an indwelling suprapubic Foley catheter. Stomach/Bowel: Stomach is within normal limits. No evidence of bowel obstruction. Normal appendix (series 5/image 226). Extensive scattered colonic diverticulosis, without evidence of diverticulitis. Lymphatic: No suspicious abdominopelvic lymphadenopathy. Reproductive: Prostatomegaly, with enlargement of the central gland which indents the base of the bladder, suggesting BPH. Other: Trace pelvic ascites (series 5/image 273). Musculoskeletal: Grade 1 spondylolisthesis at L5-S1, chronic. Mild degenerative changes of the lumbar spine. Review of the MIP images confirms the above findings. IMPRESSION: No evidence of thoracoabdominal aortic aneurysm or dissection. No evidence of pulmonary embolism. Cardiomegaly with mild interstitial edema. Moderate left and small right pleural effusions. Additional ancillary findings as above. Electronically Signed   By: SJulian HyM.D.   On: 03/25/2022 22:43   DG Chest Port 1 View  Result Date: 03/25/2022 CLINICAL DATA:  Chest pain.  EXAM: PORTABLE CHEST 1 VIEW COMPARISON:  Chest radiograph dated 10/03/2021. FINDINGS: There is mild cardiomegaly. Mild vascular congestion and edema. No focal consolidation, pleural effusion, or pneumothorax. Atherosclerotic calcification of the aorta. No acute osseous pathology. IMPRESSION: Cardiomegaly with mild vascular congestion and edema. Electronically Signed   By: AAnner CreteM.D.   On: 03/25/2022 20:41               LOS: 0 days   Naevia Unterreiner  [facility]   Pager on [email or web address] If 7PM-7AM, please contact night-coverage at [email or web address]     03/26/2022, 11:24 AM

## 2022-03-26 NOTE — Discharge Instructions (Signed)

## 2022-03-26 NOTE — TOC Initial Note (Signed)
Transition of Care [facility]) - Initial/Assessment Note    Patient Details  Name: Shawn Andrews MRN: [id number] Date of Birth: March 08, 1928  Transition of Care [facility]) CM/SW Contact:    Laurena Slimmer, RN Phone Number: 03/26/2022, 3:09 PM  Clinical Narrative:                  Transition of Care (TOC) Screening Note   Patient Details  Name: Shawn Andrews Date of Birth: 04-26-1928   Transition of Care [facility]) CM/SW Contact:    Laurena Slimmer, RN Phone Number: 03/26/2022, 3:09 PM    Transition of Care Department [facility]) has reviewed patient and no TOC needs have been identified at this time. We will continue to monitor patient advancement through interdisciplinary progression rounds. If new patient transition needs arise, please place a TOC consult.          Patient Goals and CMS Choice            Expected Discharge Plan and Services                                              Prior Living Arrangements/Services                       Activities of Daily Living Home Assistive Devices/Equipment: None ADL Screening (condition at time of admission) Patient's cognitive ability adequate to safely complete daily activities?: Yes Is the patient deaf or have difficulty hearing?: No Does the patient have difficulty seeing, even when wearing glasses/contacts?: No Does the patient have difficulty concentrating, remembering, or making decisions?: No Patient able to express need for assistance with ADLs?: Yes Does the patient have difficulty dressing or bathing?: No Independently performs ADLs?: Yes (appropriate for developmental age) Does the patient have difficulty walking or climbing stairs?: No Weakness of Legs: None Weakness of Arms/Hands: None  Permission Sought/Granted                  Emotional Assessment              Admission diagnosis:  NSTEMI (non-ST elevated myocardial infarction) ([place]) [I21.4] Patient Active Problem List   Diagnosis Date  Noted   Frequent PVCs 03/25/2022   Overweight (BMI 25.0-29.9) 10/04/2021   Generalized abdominal pain    Lower abdominal pain 02/26/2021   UTI (urinary tract infection) 02/25/2021   Suprapubic catheter ([place]) 02/25/2021   CKD (chronic kidney disease), stage IIIa [id number]   Chronic systolic CHF (congestive heart failure) ([place]) 01/17/2021   Pleural effusion 01/17/2021   Acute on chronic systolic CHF (congestive heart failure) ([place]) 01/17/2021   Constipation 12/13/2020   Hyponatremia 12/13/2020   NSTEMI (non-ST elevated myocardial infarction) ([place]) 10/16/2020   HFrEF (heart failure with reduced ejection fraction) ([place]) 10/16/2020   CAD (coronary artery disease) 10/08/2020   Elevated vitamin B12 level 08/31/2020   Leg DVT (deep venous thromboembolism), acute, left ([place]) 08/09/2019   Skin lesion 07/18/2016   Health maintenance examination 07/19/2015   Advanced care planning/counseling discussion 07/05/2014   Memory deficit 07/05/2014   Chronic kidney disease, stage 3b ([place]) 06/23/2013   Post-nasal drainage 12/22/2012   Medicare annual wellness visit, subsequent 06/15/2012   Lumbosacral radiculopathy at L5 06/17/2011   High frequency hearing loss 10/18/2010   Vitamin D deficiency 11/15/2008   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   Essential hypertension 05/22/2003  HLD (hyperlipidemia) 01/21/2002   Essential tremor 01/22/2000   ELEVATED PROSTATE SPECIFIC ANTIGEN 01/22/2000   Benign prostatic hyperplasia 01/21/1998   Osteoarthritis 01/22/1988   PCP:  Leonel Ramsay, MD Pharmacy:   [facility] [id number], [place] [address] Phone: [phone] Fax: [phone]  [facility] ([place], [place] [facility] [address] [place] [address] Phone: [phone] Fax: [place], [address] [place] [address] Phone: [phone] Fax: [phone]     Social Determinants of Health (SDOH) Social History: Reserve: No Food Insecurity (03/26/2022)  Housing: Low Risk  (03/26/2022)  Transportation Needs: No Transportation Needs (03/26/2022)  Utilities: Not At Risk (03/26/2022)  Depression (PHQ2-9): Low Risk  (09/11/2021)  Tobacco Use: High Risk (03/26/2022)   SDOH Interventions:     Readmission Risk Interventions     No data to display

## 2022-03-26 NOTE — Progress Notes (Signed)
*  PRELIMINARY RESULTS* Echocardiogram 2D Echocardiogram has been performed.  Shawn Andrews 03/26/2022, 10:14 AM

## 2022-03-27 ENCOUNTER — Other Ambulatory Visit: Payer: Self-pay

## 2022-03-27 ENCOUNTER — Encounter
Admission: EM | Disposition: A | Discharge status: Home / Self Care | Payer: Self-pay | Source: Home / Self Care | Attending: Internal Medicine

## 2022-03-27 HISTORY — PX: CORONARY STENT INTERVENTION: CATH118234

## 2022-03-27 HISTORY — PX: LEFT HEART CATH AND CORONARY ANGIOGRAPHY: CATH118249

## 2022-03-27 LAB — CBC
HCT: 28.9 % — ABNORMAL LOW (ref 39.0–52.0)
Hemoglobin: 9.7 g/dL — ABNORMAL LOW (ref 13.0–17.0)
MCH: 30 pg (ref 26.0–34.0)
MCHC: 33.6 g/dL (ref 30.0–36.0)
MCV: 89.5 fL (ref 80.0–100.0)
Platelets: 158 10*3/uL (ref 150–400)
RBC: 3.23 MIL/uL — ABNORMAL LOW (ref 4.22–5.81)
RDW: 13.7 % (ref 11.5–15.5)
WBC: 7.2 10*3/uL (ref 4.0–10.5)
nRBC: 0 % (ref 0.0–0.2)

## 2022-03-27 LAB — BASIC METABOLIC PANEL
Anion gap: 6 (ref 5–15)
BUN: 49 mg/dL — ABNORMAL HIGH (ref 8–23)
CO2: 24 mmol/L (ref 22–32)
Calcium: 8.4 mg/dL — ABNORMAL LOW (ref 8.9–10.3)
Chloride: 106 mmol/L (ref 98–111)
Creatinine, Ser: 2.1 mg/dL — ABNORMAL HIGH (ref 0.61–1.24)
GFR, Estimated: 29 mL/min — ABNORMAL LOW (ref >60)
Glucose, Bld: 115 mg/dL — ABNORMAL HIGH (ref 70–99)
Potassium: 4.8 mmol/L (ref 3.5–5.1)
Sodium: 136 mmol/L (ref 135–145)

## 2022-03-27 LAB — HEPARIN LEVEL (UNFRACTIONATED): Heparin Unfractionated: 0.45 IU/mL (ref 0.30–0.70)

## 2022-03-27 LAB — MAGNESIUM: Magnesium: 2.2 mg/dL (ref 1.7–2.4)

## 2022-03-27 SURGERY — LEFT HEART CATH AND CORONARY ANGIOGRAPHY
Anesthesia: Moderate Sedation

## 2022-03-27 MED ORDER — CLOPIDOGREL BISULFATE 75 MG PO TABS
75.0000 mg | ORAL_TABLET | Freq: Every day | ORAL | Status: DC
  Administered 2022-03-28 – 2022-03-29 (×2): 75 mg via ORAL
  Filled 2022-03-27 (×2): qty 1

## 2022-03-27 MED ORDER — HEPARIN (PORCINE) IN NACL 1000-0.9 UT/500ML-% IV SOLN
INTRAVENOUS | Status: AC
  Filled 2022-03-27: qty 1000

## 2022-03-27 MED ORDER — HEPARIN SODIUM (PORCINE) 1000 UNIT/ML IJ SOLN
INTRAMUSCULAR | Status: AC
  Filled 2022-03-27: qty 10

## 2022-03-27 MED ORDER — SODIUM CHLORIDE 0.9% FLUSH
3.0000 mL | Freq: Two times a day (BID) | INTRAVENOUS | Status: DC
  Administered 2022-03-27 – 2022-03-29 (×5): 3 mL via INTRAVENOUS

## 2022-03-27 MED ORDER — SODIUM CHLORIDE 0.9 % IV SOLN: INTRAVENOUS | Status: DC

## 2022-03-27 MED ORDER — HEPARIN (PORCINE) IN NACL 1000-0.9 UT/500ML-% IV SOLN
INTRAVENOUS | Status: DC | PRN
  Administered 2022-03-27: 1000 mL

## 2022-03-27 MED ORDER — CLOPIDOGREL BISULFATE 300 MG PO TABS
ORAL_TABLET | ORAL | Status: AC
  Filled 2022-03-27: qty 1

## 2022-03-27 MED ORDER — HYDRALAZINE HCL 20 MG/ML IJ SOLN: 10.0000 mg | INTRAMUSCULAR | Status: AC | PRN

## 2022-03-27 MED ORDER — ASPIRIN 81 MG PO CHEW
81.0000 mg | CHEWABLE_TABLET | Freq: Every day | ORAL | Status: DC
  Administered 2022-03-28 – 2022-03-29 (×2): 81 mg via ORAL
  Filled 2022-03-27 (×2): qty 1

## 2022-03-27 MED ORDER — SODIUM CHLORIDE 0.9% FLUSH: 3.0000 mL | INTRAVENOUS | Status: DC | PRN

## 2022-03-27 MED ORDER — HEPARIN SODIUM (PORCINE) 1000 UNIT/ML IJ SOLN
INTRAMUSCULAR | Status: DC | PRN
  Administered 2022-03-27: 3000 [IU] via INTRAVENOUS
  Administered 2022-03-27: 6000 [IU] via INTRAVENOUS
  Administered 2022-03-27: 4000 [IU] via INTRAVENOUS

## 2022-03-27 MED ORDER — SODIUM CHLORIDE 0.9% FLUSH
3.0000 mL | Freq: Two times a day (BID) | INTRAVENOUS | Status: DC
  Administered 2022-03-27 – 2022-03-29 (×5): 3 mL via INTRAVENOUS

## 2022-03-27 MED ORDER — SODIUM CHLORIDE 0.9 % IV SOLN: 250.0000 mL | INTRAVENOUS | Status: DC | PRN

## 2022-03-27 MED ORDER — SODIUM CHLORIDE 0.9 % NICU IV INFUSION SIMPLE: 300.0000 mL | INJECTION | Freq: Once | INTRAVENOUS | Status: AC

## 2022-03-27 MED ORDER — CLOPIDOGREL BISULFATE 75 MG PO TABS
ORAL_TABLET | ORAL | Status: DC | PRN
  Administered 2022-03-27: 300 mg via ORAL

## 2022-03-27 MED ORDER — LABETALOL HCL 5 MG/ML IV SOLN: 10.0000 mg | INTRAVENOUS | Status: AC | PRN

## 2022-03-27 MED ORDER — ASPIRIN 81 MG PO CHEW: 81.0000 mg | CHEWABLE_TABLET | ORAL | Status: DC

## 2022-03-27 MED ORDER — VERAPAMIL HCL 2.5 MG/ML IV SOLN
INTRAVENOUS | Status: AC
  Filled 2022-03-27: qty 2

## 2022-03-27 MED ORDER — ONDANSETRON HCL 4 MG/2ML IJ SOLN: 4.0000 mg | Freq: Four times a day (QID) | INTRAMUSCULAR | Status: DC | PRN

## 2022-03-27 MED ORDER — VERAPAMIL HCL 2.5 MG/ML IV SOLN
INTRAVENOUS | Status: DC | PRN
  Administered 2022-03-27: 2.5 mg via INTRA_ARTERIAL

## 2022-03-27 MED ORDER — IOHEXOL 300 MG/ML  SOLN
INTRAMUSCULAR | Status: DC | PRN
  Administered 2022-03-27: 170 mL

## 2022-03-27 MED ORDER — ACETAMINOPHEN 325 MG PO TABS: 650.0000 mg | ORAL_TABLET | ORAL | Status: DC | PRN

## 2022-03-27 SURGICAL SUPPLY — 17 items
BALLN TREK RX 3.0X12 (BALLOONS) ×1 IMPLANT
BALLOON TREK RX 3.0X12 (BALLOONS) IMPLANT
CATH 5FR JL3.5 JR4 ANG PIG MP (CATHETERS) IMPLANT
CATH VISTA GUIDE 6FR JR4 (CATHETERS) IMPLANT
DEVICE RAD TR BAND REGULAR (VASCULAR PRODUCTS) IMPLANT
DRAPE BRACHIAL (DRAPES) IMPLANT
GLIDESHEATH SLEND SS 6F .021 (SHEATH) IMPLANT
GUIDEWIRE INQWIRE 1.5J.035X260 (WIRE) IMPLANT
INQWIRE 1.5J .035X260CM (WIRE) ×1 IMPLANT
KIT ENCORE 26 ADVANTAGE (KITS) IMPLANT
PACK CARDIAC CATH (CUSTOM PROCEDURE TRAY) ×1 IMPLANT
PROTECTION STATION PRESSURIZED (MISCELLANEOUS) ×1 IMPLANT
SET ATX-X65L (MISCELLANEOUS) IMPLANT
STATION PROTECTION PRESSURIZED (MISCELLANEOUS) IMPLANT
STENT ONYX FRONTIER 3.5X15 (Permanent Stent) IMPLANT
WIRE ASAHI GRAND SLAM 180CM (WIRE) IMPLANT
WIRE G HI TQ BMW 190 (WIRE) IMPLANT

## 2022-03-27 NOTE — Progress Notes (Signed)
[facility] for heparin infusion Indication: ACS/STEMI  Allergies  Allergen Reactions   Penicillins Swelling    Of tongue Has patient had a PCN reaction causing immediate rash, facial/tongue/throat swelling, SOB or lightheadedness with hypotension: Yes Has patient had a PCN reaction causing severe rash involving mucus membranes or skin necrosis: No Has patient had a PCN reaction that required hospitalization: No Has patient had a PCN reaction occurring within the last 10 years: No If all of the above answers are "NO", then may proceed with Cephalosporin use.   Cialis [Tadalafil] Other (See Comments)    Indigestion and "felt like crap"   Lipitor [Atorvastatin] Other (See Comments)    Severe myalgias   Lisinopril Cough    Dry nagging cough    Patient Measurements: Height: '5\' 10"'$  (177.8 cm) Weight: 79.9 kg (176 lb 2.4 oz) IBW/kg (Calculated) : 73 Heparin Dosing Weight: 84.2 kg  Vital Signs: Temp: 98.2 F (36.8 C) (03/06 0442) Temp Source: Oral (03/06 0442) BP: 119/61 (03/06 0442) Pulse Rate: 68 (03/06 0442)  Labs: Recent Labs    03/25/22 2019 03/25/22 2218 03/25/22 2344 03/26/22 0904 03/26/22 1211 03/26/22 1645 03/27/22 0418  HGB 11.7*  --   --   --   --   --  9.7*  HCT 36.2*  --   --   --   --   --  28.9*  PLT 173  --   --   --   --   --  158  APTT  --   --  27  --   --   --   --   LABPROT  --   --  14.0  --   --   --   --   INR  --   --  1.1  --   --   --   --   HEPARINUNFRC  --   --   --  0.54  --  0.50 0.45  CREATININE 1.93*  --   --   --  2.13*  --  2.10*  TROPONINIHS 32* 262*  --  22,486*  --   --   --     Estimated Creatinine Clearance: 23.7 mL/min (A) (by C-G formula based on SCr of 2.1 mg/dL (H)).  Medical History: Past Medical History:  Diagnosis Date   Abnormal drug screen 06/22/2014   inapprop neg xanax rpt 3 mo (06/2014)   BPH (benign prostatic hypertrophy) 01/21/1998   has had 3 biopsies in past ([place])  decided to stop PSA/DRE   CHF (congestive heart failure) (HCC)    Coronary artery disease    Hyperlipidemia 01/21/2002   Hypertension 05/22/2003   Left lumbar radiculopathy    Osteoarthritis 01/22/1988   knees, lumbar spondylosis and listhesis    Assessment: Pt is a 89 yo male presenting to ED with h/o MI, c/o mid-sternal CP found with elevated troponin I level, trending up. Pharmacy consulted to manage heparin gtt for ACS  Results: 3/5/'@0904'$  HL=0.54, therapeutic x 1, heparin rate 1100 units/hr 3/5 @ 1645 HL = 0.50, therapeutic x 2 3/6 0418 HL 0.45, therapeutic x 3   Goal of Therapy:  Heparin level 0.3-0.7 units/ml Monitor platelets by anticoagulation protocol: Yes   Plan:  Heparin remains therapeutic  Continue heparin infusion at 1100 units/hr Check HL with daily labs CBC daily while on heparin  Renda Rolls, PharmD, [facility] 03/27/2022 5:25 AM

## 2022-03-27 NOTE — Progress Notes (Signed)
[place] NOTE       Patient ID: Shawn Andrews MRN: [id number] DOB/AGE: November 24, 1928 89 y.o.  Admit date: 03/25/2022 Referring Physician Dr. Marjean Donna Primary Physician Dr. Ola Spurr Primary Cardiologist Dr. Clayborn Bigness Reason for Consultation NSTEMI  HPI: Shawn Andrews is a 82yoM with a PMH of CAD ([place] 12/2020 with 80% distal RCA, 50% prox RCA, 55% ost-prox Lcx, and 40% or less RPDA and LAD - medically managed), HFrEF (EF 30-35%, with rWMAs 09/2021), HTN, HLD, CKD 3, BPH, essential tremor and urinary retention with suprapubic catheter since 01/2021 who presented to [facility] ED the evening of 03/25/2022 with substernal chest discomfort persisting despite taking 2 SL nitroglycerin at home. Troponins peaked at 22K. Cardiology is consulted for assistance with his NSTEMI.  Interval History:  - seen and examined this AM with multiple family members present at bedside  - slept well last night, remains chest pain free, even with ambulation. No shortness of breath or other complaints - Cr slight increase yesterday PM  - echo overall similar to prior, EF remains 30-35% - NPO for cath later today    Review of systems complete and found to be negative unless listed above     Past Medical History:  Diagnosis Date   Abnormal drug screen 06/22/2014   inapprop neg xanax rpt 3 mo (06/2014)   BPH (benign prostatic hypertrophy) 01/21/1998   has had 3 biopsies in past ([facility]) decided to stop PSA/DRE   CHF (congestive heart failure) (HCC)    Coronary artery disease    Hyperlipidemia 01/21/2002   Hypertension 05/22/2003   Left lumbar radiculopathy    Osteoarthritis 01/22/1988   knees, lumbar spondylosis and listhesis    Past Surgical History:  Procedure Laterality Date   CATARACT EXTRACTION  2013   bilateral   ESI Left 08/2014, 11/2014, 08/2015   L S1, L L5/S1 transforaminal ESI; L4/5 L5/S1 zygapophysial injections, L S1 transforaminal ([place])   FINGER SURGERY     right  middle, partial traumatic amputation   IR CATHETER TUBE CHANGE  03/02/2021   KNEE ARTHROSCOPY  1990   right   KNEE SURGERY  04/2006   right partial knee replacement in [place] - rec ppx abx for any invasive procedure   LEFT HEART CATH AND CORONARY ANGIOGRAPHY N/A 01/18/2021   Procedure: LEFT HEART CATH AND CORONARY ANGIOGRAPHY;  Surgeon: Corey Skains, MD;  Location: [facility];  Service: Cardiovascular;  Laterality: N/A;    Medications Prior to Admission  Medication Sig Dispense Refill Last Dose   acetaminophen (TYLENOL 8 HOUR) 650 MG CR tablet Take 1 tablet (650 mg total) by mouth 2 (two) times a day.   03/25/2022 at am   aspirin 81 MG chewable tablet Chew 1 tablet by mouth daily.   03/25/2022   Cholecalciferol (VITAMIN D3) 25 MCG (1000 UT) CAPS Take 1 capsule (1,000 Units total) by mouth daily. 30 capsule  03/25/2022 at am   clopidogrel (PLAVIX) 75 MG tablet Take 75 mg by mouth daily.   03/25/2022   cyanocobalamin 1000 MCG tablet Take 1,000 mcg by mouth daily.   03/25/2022   docusate sodium (COLACE) 100 MG capsule Take 1 capsule (100 mg total) by mouth daily.   03/25/2022 at am   doxazosin (CARDURA) 4 MG tablet TAKE 1 TABLET BY MOUTH  DAILY (Patient taking differently: Take 4 mg by mouth daily. Take 1 tablet by mouth  daily) 90 tablet 3 03/25/2022   famotidine (PEPCID) 20 MG tablet TAKE  1 TABLET BY MOUTH AT  BEDTIME (Patient taking differently: Take 20 mg by mouth at bedtime.) 90 tablet 3 Past Week   finasteride (PROSCAR) 5 MG tablet TAKE 1 TABLET BY MOUTH  DAILY (Patient taking differently: Take 5 mg by mouth daily.) 90 tablet 3 03/25/2022   furosemide (LASIX) 20 MG tablet Take 1 tablet by mouth every other day.   Past Week   hydrALAZINE (APRESOLINE) 25 MG tablet Take 25 mg by mouth 3 (three) times daily.   03/25/2022   isosorbide mononitrate (IMDUR) 30 MG 24 hr tablet Take 30 mg by mouth daily.   03/25/2022   loratadine (CLARITIN) 10 MG tablet Take 1 tablet (10 mg total) by mouth daily as needed  for allergies. 30 tablet  unknown   metoprolol tartrate (LOPRESSOR) 50 MG tablet Take 0.5 tablets (25 mg total) by mouth 2 (two) times daily.   03/25/2022   nitroGLYCERIN (NITROSTAT) 0.4 MG SL tablet Place under the tongue.   unknown   pantoprazole (PROTONIX) 40 MG tablet Take 40 mg by mouth 2 (two) times daily.   03/25/2022   polyethylene glycol powder (GLYCOLAX/MIRALAX) 17 GM/SCOOP powder Take 8.5-17 g by mouth daily as needed for moderate constipation. 3350 g 0 unknown   primidone (MYSOLINE) 50 MG tablet Take 2 tablets (100 mg total) by mouth 3 (three) times daily.   03/25/2022   rosuvastatin (CRESTOR) 20 MG tablet Take 1 tablet (20 mg total) by mouth at bedtime. 30 tablet 1 Past Week   Social History   Socioeconomic History   Marital status: Married    Spouse name: Not on file   Number of children: Not on file   Years of education: Not on file   Highest education level: Not on file  Occupational History   Occupation: retired Event organiser, Facilities manager, owns lawn care business  Tobacco Use   Smoking status: Former    Packs/day: 0.00    Years: 0.00    Total pack years: 0.00    Types: Pipe, Cigarettes    Quit date: 12/26/2000    Years since quitting: 21.2    Passive exposure: Past   Smokeless tobacco: Current    Types: Chew   Tobacco comments:    quit over 30 years ago  Vaping Use   Vaping Use: Never used  Substance and Sexual Activity   Alcohol use: Yes    Alcohol/week: 0.0 standard drinks of alcohol    Comment: Occasional beer   Drug use: No   Sexual activity: Not on file  Other Topics Concern   Not on file  Social History Narrative   Widowed, remarried 2007-08-01.   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 [employer]    Activity: walks 1.5 miles 3d/wk   Diet: good water, fruits/vegetables daily   Social Determinants of Health   Financial Resource Strain: Not on file  Food  Insecurity: No Food Insecurity (03/26/2022)   Hunger Vital Sign    Worried About Running Out of Food in the Last Year: Never true    [place] in the Last Year: Never true  Transportation Needs: No Transportation Needs (03/26/2022)   PRAPARE - Hydrologist (Medical): No    Lack of Transportation (Non-Medical): No  Physical Activity: Not on file  Stress: Not on file  Social Connections: Not on file  Intimate Partner Violence: Not At Risk (03/26/2022)   Humiliation,  Afraid, Rape, and Kick questionnaire    Fear of Current or Ex-Partner: No    Emotionally Abused: No    Physically Abused: No    Sexually Abused: No    Family History  Problem Relation Age of Onset   Hypertension Mother    Heart disease Brother 17       MI   Diabetes Brother    Cancer Daughter        melanoma   Stroke Neg Hx       Intake/Output Summary (Last 24 hours) at 03/27/2022 0758 Last data filed at 03/26/2022 1920 Gross per 24 hour  Intake 1720.66 ml  Output 1550 ml  Net 170.66 ml     Vitals:   03/26/22 2056 03/27/22 0000 03/27/22 0442 03/27/22 0500  BP: 102/77 (!) 113/51 119/61   Pulse: 66 (!) 58 68   Resp: 20 (!) 24 20   Temp: 98.1 F (36.7 C) 98.2 F (36.8 C) 98.2 F (36.8 C)   TempSrc: Oral Axillary Oral   SpO2: 96% 97% 98%   Weight:    79.9 kg  Height:        PHYSICAL EXAM General: Pleasant elderly Caucasian male, well nourished, in no acute distress.  Sitting at incline in bed, wife, son, daughter in law, and granddaughter at bedside HEENT:  Normocephalic and atraumatic. Neck:  No JVD.  Lungs: Normal respiratory effort on oxygen by nasal cannula.  Decreased breath sounds with trace crackles, no wheezing Heart: HRRR with frequent premature beats. Normal S1 and S2 without gallops or murmurs.  Abdomen: Non-distended appearing. Msk: Normal strength and tone for age. Extremities: Warm and well perfused. No clubbing, cyanosis. Trace bilateral peripheral edema.   Neuro: Alert and oriented X 3. Psych:  Answers questions appropriately.   Labs: Basic Metabolic Panel: Recent Labs    03/26/22 1211 03/27/22 0418  NA 139 136  K 5.5* 4.8  CL 109 106  CO2 17* 24  GLUCOSE 133* 115*  BUN 43* 49*  CREATININE 2.13* 2.10*  CALCIUM 8.8* 8.4*  MG  --  2.2    Liver Function Tests: Recent Labs    03/25/22 2019  AST 18  ALT 12  ALKPHOS 68  BILITOT 0.4  PROT 6.8  ALBUMIN 4.4    Recent Labs    03/25/22 2019  LIPASE 46    CBC: Recent Labs    03/25/22 2019 03/27/22 0418  WBC 9.1 7.2  HGB 11.7* 9.7*  HCT 36.2* 28.9*  MCV 92.8 89.5  PLT 173 158    Cardiac Enzymes: Recent Labs    03/25/22 2019 03/25/22 2218 03/26/22 0904  TROPONINIHS 32* 262* 22,486*    BNP: Recent Labs    03/25/22 2019  BNP 598.2*    D-Dimer: No results for input(s): "DDIMER" in the last 72 hours. Hemoglobin A1C: No results for input(s): "HGBA1C" in the last 72 hours. Fasting Lipid Panel: No results for input(s): "CHOL", "HDL", "LDLCALC", "TRIG", "CHOLHDL", "LDLDIRECT" in the last 72 hours. Thyroid Function Tests: No results for input(s): "TSH", "T4TOTAL", "T3FREE", "THYROIDAB" in the last 72 hours.  Invalid input(s): "FREET3" Anemia Panel: No results for input(s): "VITAMINB12", "FOLATE", "FERRITIN", "TIBC", "IRON", "RETICCTPCT" in the last 72 hours.   Radiology: ECHOCARDIOGRAM COMPLETE  Result Date: 03/26/2022    ECHOCARDIOGRAM REPORT   Patient Name:   HERSHEY CURCIO Date of Exam: 03/26/2022 Medical Rec #:  [id number]     Height:       70.0 in Accession #:  [id number]    Weight:       187.2 lb Date of Birth:  [birth year]     BSA:          2.029 m Patient Age:    76 years      BP:           108/54 mmHg Patient Gender: M             HR:           66 bpm. Exam Location:  [facility] Procedure: 2D Echo, Cardiac Doppler and Color Doppler Indications:     Chest Pain  History:         Patient has prior history of Echocardiogram examinations, most                   recent 10/04/2021. CHF, CAD and Acute MI, Arrythmias:PVC,                  Signs/Symptoms:Chest Pain; Risk Factors:Hypertension and                  Dyslipidemia.  Sonographer:     Wenda Low Referring Phys:  [id number] [place] Nataline Basara Diagnosing Phys: Yolonda Kida MD IMPRESSIONS  1. Ant/Apical hypo.  2. Left ventricular ejection fraction, by estimation, is 30 to 35%. The left ventricle has moderately decreased function. The left ventricle demonstrates regional wall motion abnormalities (see scoring diagram/findings for description). The left ventricular internal cavity size was moderately dilated. There is mild concentric left ventricular hypertrophy. Left ventricular diastolic parameters are consistent with Grade I diastolic dysfunction (impaired relaxation).  3. Right ventricular systolic function is low normal. The right ventricular size is mildly enlarged. Mildly increased right ventricular wall thickness. There is severely elevated pulmonary artery systolic pressure.  4. Left atrial size was moderately dilated.  5. Right atrial size was mildly dilated.  6. The mitral valve is normal in structure. Mild mitral valve regurgitation.  7. Tricuspid valve regurgitation is mild to moderate.  8. The aortic valve is grossly normal. Aortic valve regurgitation is not visualized. FINDINGS  Left Ventricle: Left ventricular ejection fraction, by estimation, is 30 to 35%. The left ventricle has moderately decreased function. The left ventricle demonstrates regional wall motion abnormalities. The left ventricular internal cavity size was moderately dilated. There is mild concentric left ventricular hypertrophy. Left ventricular diastolic parameters are consistent with Grade I diastolic dysfunction (impaired relaxation). Right Ventricle: The right ventricular size is mildly enlarged. Mildly increased right ventricular wall thickness. Right ventricular systolic function is low normal. There is severely elevated  pulmonary artery systolic pressure. The tricuspid regurgitant  velocity is 3.68 m/s, and with an assumed right atrial pressure of 8 mmHg, the estimated right ventricular systolic pressure is 0000000 mmHg. Left Atrium: Left atrial size was moderately dilated. Right Atrium: Right atrial size was mildly dilated. Pericardium: There is no evidence of pericardial effusion. Mitral Valve: The mitral valve is normal in structure. Mild mitral valve regurgitation. MV peak gradient, 3.4 mmHg. The mean mitral valve gradient is 1.0 mmHg. Tricuspid Valve: The tricuspid valve is normal in structure. Tricuspid valve regurgitation is mild to moderate. Aortic Valve: The aortic valve is grossly normal. Aortic valve regurgitation is not visualized. Aortic valve mean gradient measures 3.5 mmHg. Aortic valve peak gradient measures 7.2 mmHg. Aortic valve area, by VTI measures 3.00 cm. Pulmonic Valve: The pulmonic valve was grossly normal. Pulmonic valve regurgitation is not visualized. Aorta: The ascending aorta was not well visualized. IAS/Shunts:  No atrial level shunt detected by color flow Doppler. Additional Comments: Ant/Apical hypo.  LEFT VENTRICLE PLAX 2D LVIDd:         5.30 cm   Diastology LVIDs:         4.40 cm   LV e' medial:    5.22 cm/s LV PW:         1.30 cm   LV E/e' medial:  12.5 LV IVS:        1.20 cm   LV e' lateral:   10.70 cm/s LVOT diam:     2.10 cm   LV E/e' lateral: 6.1 LV SV:         96 LV SV Index:   47 LVOT Area:     3.46 cm  RIGHT VENTRICLE RV Basal diam:  4.35 cm RV Mid diam:    3.70 cm RV S prime:     16.90 cm/s LEFT ATRIUM              Index        RIGHT ATRIUM           Index LA diam:        5.10 cm  2.51 cm/m   RA Area:     21.30 cm LA Vol (A2C):   97.6 ml  48.09 ml/m  RA Volume:   65.90 ml  32.47 ml/m LA Vol (A4C):   102.0 ml 50.26 ml/m LA Biplane Vol: 107.0 ml 52.73 ml/m  AORTIC VALVE                    PULMONIC VALVE AV Area (Vmax):    3.10 cm     PV Vmax:       1.08 m/s AV Area (Vmean):   2.85 cm      PV Peak grad:  4.7 mmHg AV Area (VTI):     3.00 cm AV Vmax:           134.00 cm/s AV Vmean:          83.950 cm/s AV VTI:            0.321 m AV Peak Grad:      7.2 mmHg AV Mean Grad:      3.5 mmHg LVOT Vmax:         120.00 cm/s LVOT Vmean:        69.100 cm/s LVOT VTI:          0.278 m LVOT/AV VTI ratio: 0.87  AORTA Ao Root diam: 3.30 cm MITRAL VALVE               TRICUSPID VALVE MV Area (PHT): 4.08 cm    TR Peak grad:   54.2 mmHg MV Area VTI:   3.06 cm    TR Vmax:        368.00 cm/s MV Peak grad:  3.4 mmHg MV Mean grad:  1.0 mmHg    SHUNTS MV Vmax:       0.93 m/s    Systemic VTI:  0.28 m MV Vmean:      52.2 cm/s   Systemic Diam: 2.10 cm MV Decel Time: 186 msec MV E velocity: 65.00 cm/s MV A velocity: 89.60 cm/s MV E/A ratio:  0.73 Shawn D Callwood MD Electronically signed by Yolonda Kida MD Signature Date/Time: 03/26/2022/2:00:05 PM    Final    CT Angio Chest/Abd/Pel for Dissection W and/or Wo Contrast  Result Date: 03/25/2022 CLINICAL DATA:  Midsternal chest pain EXAM:  CT ANGIOGRAPHY CHEST, ABDOMEN AND PELVIS TECHNIQUE: Non-contrast CT of the chest was initially obtained. Multidetector CT imaging through the chest, abdomen and pelvis was performed using the standard protocol during bolus administration of intravenous contrast. Multiplanar reconstructed images and MIPs were obtained and reviewed to evaluate the vascular anatomy. RADIATION DOSE REDUCTION: This exam was performed according to the departmental dose-optimization program which includes automated exposure control, adjustment of the mA and/or kV according to patient size and/or use of iterative reconstruction technique. CONTRAST:  81m OMNIPAQUE IOHEXOL 350 MG/ML SOLN COMPARISON:  CT abdomen/pelvis dated 02/25/2021. CT chest dated 01/09/2021. FINDINGS: CTA CHEST FINDINGS Cardiovascular: On unenhanced CT, there is no evidence of intramural hematoma. Following contrast administration, there is no evidence of thoracic aortic aneurysm or dissection.  Atherosclerotic calcifications of the arch. Although not tailored for evaluation of the pulmonary arteries, there is no evidence of pulmonary embolism to the lobar level. Mild cardiomegaly.  No pericardial effusion. Severe three-vessel coronary atherosclerosis. Mediastinum/Nodes: No suspicious mediastinal lymphadenopathy. Visualized thyroid is unremarkable. Lungs/Pleura: Moderate left and small right pleural effusions. Additional faint ground-glass opacities in the lungs bilaterally, with mild interlobular septal thickening in the upper lobes, favoring mild interstitial edema. Mild dependent atelectasis in the posterior upper and lower lobes. No focal consolidation. No pneumothorax. Musculoskeletal: Degenerative changes of the mid/lower thoracic spine. Review of the MIP images confirms the above findings. CTA ABDOMEN AND PELVIS FINDINGS VASCULAR Aorta: No evidence of abdominal aortic aneurysm or dissection. Patent. Atherosclerotic calcifications. Celiac: Patent.  Atherosclerotic calcifications. SMA: Patent.  Atherosclerotic calcifications at the origin. Renals: Patent. Atherosclerotic calcifications at the origin bilaterally. IMA: Patent. Inflow: Patent bilaterally.  Atherosclerotic calcifications. Veins: Grossly unremarkable. Review of the MIP images confirms the above findings. NON-VASCULAR Hepatobiliary: Liver is within normal limits. Gallbladder is unremarkable. No intrahepatic or extrahepatic ductal dilatation. Pancreas: Within normal limits. Spleen: Within normal limits. Adrenals/Urinary Tract: Adrenal glands are within normal limits. Bilateral renal cysts, measuring up to 3.2 cm in the left upper kidney (series 5/image 152), benign (Bosniak I). No follow-up is recommended. No hydronephrosis. Bladder is not decompressed with an indwelling suprapubic Foley catheter. Stomach/Bowel: Stomach is within normal limits. No evidence of bowel obstruction. Normal appendix (series 5/image 226). Extensive scattered colonic  diverticulosis, without evidence of diverticulitis. Lymphatic: No suspicious abdominopelvic lymphadenopathy. Reproductive: Prostatomegaly, with enlargement of the central gland which indents the base of the bladder, suggesting BPH. Other: Trace pelvic ascites (series 5/image 273). Musculoskeletal: Grade 1 spondylolisthesis at L5-S1, chronic. Mild degenerative changes of the lumbar spine. Review of the MIP images confirms the above findings. IMPRESSION: No evidence of thoracoabdominal aortic aneurysm or dissection. No evidence of pulmonary embolism. Cardiomegaly with mild interstitial edema. Moderate left and small right pleural effusions. Additional ancillary findings as above. Electronically Signed   By: SJulian HyM.D.   On: 03/25/2022 22:43   DG Chest Port 1 View  Result Date: 03/25/2022 CLINICAL DATA:  Chest pain. EXAM: PORTABLE CHEST 1 VIEW COMPARISON:  Chest radiograph dated 10/03/2021. FINDINGS: There is mild cardiomegaly. Mild vascular congestion and edema. No focal consolidation, pleural effusion, or pneumothorax. Atherosclerotic calcification of the aorta. No acute osseous pathology. IMPRESSION: Cardiomegaly with mild vascular congestion and edema. Electronically Signed   By: AAnner CreteM.D.   On: 03/25/2022 20:41    ECHO 09/2021  1. Left ventricular ejection fraction, by estimation, is 30 to 35%. The  left ventricle has moderately decreased function. The left ventricle  demonstrates regional wall motion abnormalities (see scoring  diagram/findings for description). Left  ventricular   diastolic parameters were normal.   2. Right ventricular systolic function is normal. The right ventricular  size is normal.   3. The mitral valve is normal in structure. Mild mitral valve  regurgitation. No evidence of mitral stenosis.   4. The aortic valve is normal in structure. Aortic valve regurgitation is  mild. No aortic stenosis is present.   5. The inferior vena cava is normal in size  with greater than 50%  respiratory variability, suggesting right atrial pressure of 3 mmHg.   Leane Call 01/08/22 Impression   Abnormal myocardial perfusion scan left ventricular enlargement globally depressed ejection fraction of 35% there is a moderate to large area of anterior apical persistent defect consistent with scar there is also RV uptake consistent with systolic cardiomyopathy.  No clear evidence of reversible ischemia.  Conclusion this is a high risk study  TELEMETRY reviewed by me (LT) 03/27/2022 : sinus brady to NSR rate 50s - 60s with frequent PVCs, occasionally in a pattern of bigeminy.   EKG reviewed by me: NSR lateral ST depressions  Data reviewed by me (LT) 03/27/2022: ed note, admission H&P last 24h vitals tele labs imaging I/O    Principal Problem:   NSTEMI (non-ST elevated myocardial infarction) [facility]) Active Problems:   Essential hypertension   Essential tremor   Chronic kidney disease, stage 3b (HCC)   Memory deficit   HFrEF (heart failure with reduced ejection fraction) (HCC)   Acute on chronic systolic CHF (congestive heart failure) ([place])   Suprapubic catheter ([place])   Frequent PVCs    ASSESSMENT AND PLAN:  Joylene Draft F. Schnoor is a 70yoM with a PMH of CAD ([place] 12/2020 with 80% distal RCA, 50% prox RCA, 55% ost-prox Lcx, and 40% or less RPDA and LAD - medically managed), HFrEF (EF 30-35%, with rWMAs 09/2021), HTN, HLD, CKD 3, BPH, essential tremor and urinary retention with suprapubic catheter since 01/2021 who presented to [facility] ED the evening of 03/25/2022 with substernal chest discomfort persisting despite taking 2 SL nitroglycerin at home. Troponins peaked at 22K. Cardiology is consulted for assistance with his NSTEMI.  # NSTEMI  Presented with severe substernal chest discomfort after eating a baked potato for dinner on 3/4, eventual relief with nitro and heparin infusion started in the emergency department.  EKG with lateral ST depressions, troponin with  current peak 22,400, concerning for non-STEMI.  Chest pain-free at my time of evaluation while on a nitro gtt. -S/p 325 mg aspirin, 81 mg aspirin and clopidogrel 75 mg daily -Continue heparin drip until LHC -Continue metoprolol tartrate 12.5 mg twice daily -s/p nitroglycerin infusion -Consider Imdur 90 mg daily  -Consider addition of ranexa  -echo complete resulted similarly to prior 6 months ago -LHC with Dr. Clayborn Bigness at 12:30pm today, further recommendations based on results.   # acute on chronic HFrEF (30-35%, rWMAs) Not clinically hypervolemic on exam, bnp slightly up at 600. S/p IV lasix '40mg'$  x1 and '20mg'$  x 1.  -hold further diuresis for now, start KVO fluids pre cath this AM - continue metoprolol 12.'5mg'$  BID, consider ARB/MRA/SGLT2i as renal function and BP allow.   # CKD 3 BUN/Cr on admission 43/1.93 and GFR 32, today BUN/Cr 49/2.1 and GFR 29. Continue to monitor closely.   This patient's plan of care was discussed and created with Dr. Clayborn Bigness and he is in agreement.  Signed: Tristan Schroeder , PA-C 03/27/2022, 7:58 AM [facility]

## 2022-03-27 NOTE — Progress Notes (Signed)
Mobility Specialist - Progress Note   03/27/22 0907  Mobility  Activity Ambulated with assistance in hallway;Stood at bedside;Dangled on edge of bed  Level of Assistance Standby assist, set-up cues, supervision of patient - no hands on  Assistive Device Other (Comment) (IV Pole)  Distance Ambulated (ft) 180 ft  Activity Response Tolerated well  Mobility Referral Yes  $Mobility charge 1 Mobility   Pt supine in bed on 2L upon arrival. Pt STS and ambulates in hallway SBA. Pt does not have any LOB during ambulation but does veer to the left. Pt returns to bed with needs in reach and family in room.   Shawn Andrews  Mobility Specialist  03/27/22 9:10 AM

## 2022-03-27 NOTE — TOC Progression Note (Signed)
Transition of Care [facility]) - Progression Note    Patient Details  Name: Shawn Andrews MRN: [id number] Date of Birth: 09/17/1928  [facility] CM/SW Contact  Laurena Slimmer, RN Phone Number: 03/27/2022, 2:09 PM  Clinical Narrative:    Case reviewed for DME needs and changes in discharge disposition.         Expected Discharge Plan and Services                                               Social Determinants of Health (SDOH) Interventions SDOH Screenings   Food Insecurity: No Food Insecurity (03/26/2022)  Housing: Low Risk  (03/26/2022)  Transportation Needs: No Transportation Needs (03/26/2022)  Utilities: Not At Risk (03/26/2022)  Depression (PHQ2-9): Low Risk  (09/11/2021)  Tobacco Use: High Risk (03/26/2022)    Readmission Risk Interventions     No data to display

## 2022-03-27 NOTE — Progress Notes (Addendum)
Dr. Clayborn Bigness at bedside & spoke with pt. And his wife re: cath results. MD also spoke with son & daughter-in-law re: results. All parties involved verbalized understanding.  MD made aware pt. Came out of procedure with bandaid over radial/TR band site & a "skin tear" on forearm under TR band. Lovey Newcomer, cath lab tech. Stated "he's got onion skin & it tore when we put on the TR band." MD made aware now on arrival to recovery.

## 2022-03-27 NOTE — CV Procedure (Addendum)
Brief post cath note  Brought to the Cath Lab because of non-STEMI known coronary disease cardiomyopathy. Right radial approach Left ventriculogram moderate to severely depressed left ventricular function 25 to 30% left ventricular enlargement  Coronaries Left main large moderate calcification LAD large proximal 75% lesion with moderate calcification mid LAD with a 90% lesion instent restenosis otherwise widely patent stents extensive prior stents TIMI-3 flow Circumflex is medium with minor irregularities RCA very large proximal ulcerated plaque of around 75% with TIMI-3 flow  Intervention PCI and stent to proximal RCA Placement of 3.5 x 15 mm DES to 15 atm Lesion reduced from 75 down to 0  Patient received 170 cc of contrast Patient tolerated procedure well TR band applied Consider staging mid LAD and instent restenosis at a later date Will reassess renal function after today's dye load Patient to be transported back to progressive care Maintained on aspirin Plavix Full cath note to follow

## 2022-03-27 NOTE — Progress Notes (Signed)
[facility] [place] NAME: Shawn Andrews    MR#:  [id number]  DATE OF BIRTH:  1928-10-12  SUBJECTIVE:  Seen in special recovery after. Patient symptom-free. Patient's son and wife side. No shortness of breath.    VITALS:  Blood pressure (!) 140/72, pulse 74, temperature 97.7 F (36.5 C), temperature source Oral, resp. rate 20, height '5\' 10"'$  (1.778 m), weight 79.9 kg, SpO2 98 %.  PHYSICAL EXAMINATION:   GENERAL:  89 y.o.-year-old patient with no acute distress.  LUNGS: Normal breath sounds bilaterally, no wheezing CARDIOVASCULAR: S1, S2 normal. No murmur   ABDOMEN: Soft, nontender, nondistended. Bowel sounds present.  EXTREMITIES: No  edema b/l.    NEUROLOGIC: nonfocal  patient is alert and awake SKIN: No obvious rash, lesion, or ulcer. Brusies + UE  LABORATORY PANEL:  CBC Recent Labs  Lab 03/27/22 0418  WBC 7.2  HGB 9.7*  HCT 28.9*  PLT 158    Chemistries  Recent Labs  Lab 03/25/22 2019 03/26/22 1211 03/27/22 0418  NA 133*   < > 136  K 4.6   < > 4.8  CL 104   < > 106  CO2 20*   < > 24  GLUCOSE 177*   < > 115*  BUN 43*   < > 49*  CREATININE 1.93*   < > 2.10*  CALCIUM 8.9   < > 8.4*  MG  --   --  2.2  AST 18  --   --   ALT 12  --   --   ALKPHOS 68  --   --   BILITOT 0.4  --   --    < > = values in this interval not displayed.   Cardiac Enzymes No results for input(s): "TROPONINI" in the last 168 hours. RADIOLOGY:  ECHOCARDIOGRAM COMPLETE  Result Date: 03/26/2022    ECHOCARDIOGRAM REPORT   Patient Name:   Shawn Andrews Date of Exam: 03/26/2022 Medical Rec #:  [id number]     Height:       70.0 in Accession #:    [id number]    Weight:       187.2 lb Date of Birth:  November 06, 1928     BSA:          2.029 m Patient Age:    11 years      BP:           108/54 mmHg Patient Gender: M             HR:           66 bpm. Exam Location:  [facility] Procedure: 2D Echo, Cardiac Doppler and Color Doppler Indications:     Chest Pain  History:          Patient has prior history of Echocardiogram examinations, most                  recent 10/04/2021. CHF, CAD and Acute MI, Arrythmias:PVC,                  Signs/Symptoms:Chest Pain; Risk Factors:Hypertension and                  Dyslipidemia.  Sonographer:     Wenda Low Referring Phys:  [id number] [place] TANG Diagnosing Phys: Yolonda Kida MD IMPRESSIONS  1. Ant/Apical hypo.  2. Left ventricular ejection fraction, by estimation, is 30 to 35%. The left ventricle has moderately decreased function. The  left ventricle demonstrates regional wall motion abnormalities (see scoring diagram/findings for description). The left ventricular internal cavity size was moderately dilated. There is mild concentric left ventricular hypertrophy. Left ventricular diastolic parameters are consistent with Grade I diastolic dysfunction (impaired relaxation).  3. Right ventricular systolic function is low normal. The right ventricular size is mildly enlarged. Mildly increased right ventricular wall thickness. There is severely elevated pulmonary artery systolic pressure.  4. Left atrial size was moderately dilated.  5. Right atrial size was mildly dilated.  6. The mitral valve is normal in structure. Mild mitral valve regurgitation.  7. Tricuspid valve regurgitation is mild to moderate.  8. The aortic valve is grossly normal. Aortic valve regurgitation is not visualized. FINDINGS  Left Ventricle: Left ventricular ejection fraction, by estimation, is 30 to 35%. The left ventricle has moderately decreased function. The left ventricle demonstrates regional wall motion abnormalities. The left ventricular internal cavity size was moderately dilated. There is mild concentric left ventricular hypertrophy. Left ventricular diastolic parameters are consistent with Grade I diastolic dysfunction (impaired relaxation). Right Ventricle: The right ventricular size is mildly enlarged. Mildly increased right ventricular wall thickness.  Right ventricular systolic function is low normal. There is severely elevated pulmonary artery systolic pressure. The tricuspid regurgitant  velocity is 3.68 m/s, and with an assumed right atrial pressure of 8 mmHg, the estimated right ventricular systolic pressure is 0000000 mmHg. Left Atrium: Left atrial size was moderately dilated. Right Atrium: Right atrial size was mildly dilated. Pericardium: There is no evidence of pericardial effusion. Mitral Valve: The mitral valve is normal in structure. Mild mitral valve regurgitation. MV peak gradient, 3.4 mmHg. The mean mitral valve gradient is 1.0 mmHg. Tricuspid Valve: The tricuspid valve is normal in structure. Tricuspid valve regurgitation is mild to moderate. Aortic Valve: The aortic valve is grossly normal. Aortic valve regurgitation is not visualized. Aortic valve mean gradient measures 3.5 mmHg. Aortic valve peak gradient measures 7.2 mmHg. Aortic valve area, by VTI measures 3.00 cm. Pulmonic Valve: The pulmonic valve was grossly normal. Pulmonic valve regurgitation is not visualized. Aorta: The ascending aorta was not well visualized. IAS/Shunts: No atrial level shunt detected by color flow Doppler. Additional Comments: Ant/Apical hypo.  LEFT VENTRICLE PLAX 2D LVIDd:         5.30 cm   Diastology LVIDs:         4.40 cm   LV e' medial:    5.22 cm/s LV PW:         1.30 cm   LV E/e' medial:  12.5 LV IVS:        1.20 cm   LV e' lateral:   10.70 cm/s LVOT diam:     2.10 cm   LV E/e' lateral: 6.1 LV SV:         96 LV SV Index:   47 LVOT Area:     3.46 cm  RIGHT VENTRICLE RV Basal diam:  4.35 cm RV Mid diam:    3.70 cm RV S prime:     16.90 cm/s LEFT ATRIUM              Index        RIGHT ATRIUM           Index LA diam:        5.10 cm  2.51 cm/m   RA Area:     21.30 cm LA Vol (A2C):   97.6 ml  48.09 ml/m  RA Volume:   65.90 ml  32.47  ml/m LA Vol (A4C):   102.0 ml 50.26 ml/m LA Biplane Vol: 107.0 ml 52.73 ml/m  AORTIC VALVE                    PULMONIC VALVE AV Area  (Vmax):    3.10 cm     PV Vmax:       1.08 m/s AV Area (Vmean):   2.85 cm     PV Peak grad:  4.7 mmHg AV Area (VTI):     3.00 cm AV Vmax:           134.00 cm/s AV Vmean:          83.950 cm/s AV VTI:            0.321 m AV Peak Grad:      7.2 mmHg AV Mean Grad:      3.5 mmHg LVOT Vmax:         120.00 cm/s LVOT Vmean:        69.100 cm/s LVOT VTI:          0.278 m LVOT/AV VTI ratio: 0.87  AORTA Ao Root diam: 3.30 cm MITRAL VALVE               TRICUSPID VALVE MV Area (PHT): 4.08 cm    TR Peak grad:   54.2 mmHg MV Area VTI:   3.06 cm    TR Vmax:        368.00 cm/s MV Peak grad:  3.4 mmHg MV Mean grad:  1.0 mmHg    SHUNTS MV Vmax:       0.93 m/s    Systemic VTI:  0.28 m MV Vmean:      52.2 cm/s   Systemic Diam: 2.10 cm MV Decel Time: 186 msec MV E velocity: 65.00 cm/s MV A velocity: 89.60 cm/s MV E/A ratio:  0.73 Dwayne D Callwood MD Electronically signed by Yolonda Kida MD Signature Date/Time: 03/26/2022/2:00:05 PM    Final    CT Angio Chest/Abd/Pel for Dissection W and/or Wo Contrast  Result Date: 03/25/2022 CLINICAL DATA:  Midsternal chest pain EXAM: CT ANGIOGRAPHY CHEST, ABDOMEN AND PELVIS TECHNIQUE: Non-contrast CT of the chest was initially obtained. Multidetector CT imaging through the chest, abdomen and pelvis was performed using the standard protocol during bolus administration of intravenous contrast. Multiplanar reconstructed images and MIPs were obtained and reviewed to evaluate the vascular anatomy. RADIATION DOSE REDUCTION: This exam was performed according to the departmental dose-optimization program which includes automated exposure control, adjustment of the mA and/or kV according to patient size and/or use of iterative reconstruction technique. CONTRAST:  60m OMNIPAQUE IOHEXOL 350 MG/ML SOLN COMPARISON:  CT abdomen/pelvis dated 02/25/2021. CT chest dated 01/09/2021. FINDINGS: CTA CHEST FINDINGS Cardiovascular: On unenhanced CT, there is no evidence of intramural hematoma. Following contrast  administration, there is no evidence of thoracic aortic aneurysm or dissection. Atherosclerotic calcifications of the arch. Although not tailored for evaluation of the pulmonary arteries, there is no evidence of pulmonary embolism to the lobar level. Mild cardiomegaly.  No pericardial effusion. Severe three-vessel coronary atherosclerosis. Mediastinum/Nodes: No suspicious mediastinal lymphadenopathy. Visualized thyroid is unremarkable. Lungs/Pleura: Moderate left and small right pleural effusions. Additional faint ground-glass opacities in the lungs bilaterally, with mild interlobular septal thickening in the upper lobes, favoring mild interstitial edema. Mild dependent atelectasis in the posterior upper and lower lobes. No focal consolidation. No pneumothorax. Musculoskeletal: Degenerative changes of the mid/lower thoracic spine. Review of the MIP images confirms the above findings.  CTA ABDOMEN AND PELVIS FINDINGS VASCULAR Aorta: No evidence of abdominal aortic aneurysm or dissection. Patent. Atherosclerotic calcifications. Celiac: Patent.  Atherosclerotic calcifications. SMA: Patent.  Atherosclerotic calcifications at the origin. Renals: Patent. Atherosclerotic calcifications at the origin bilaterally. IMA: Patent. Inflow: Patent bilaterally.  Atherosclerotic calcifications. Veins: Grossly unremarkable. Review of the MIP images confirms the above findings. NON-VASCULAR Hepatobiliary: Liver is within normal limits. Gallbladder is unremarkable. No intrahepatic or extrahepatic ductal dilatation. Pancreas: Within normal limits. Spleen: Within normal limits. Adrenals/Urinary Tract: Adrenal glands are within normal limits. Bilateral renal cysts, measuring up to 3.2 cm in the left upper kidney (series 5/image 152), benign (Bosniak I). No follow-up is recommended. No hydronephrosis. Bladder is not decompressed with an indwelling suprapubic Foley catheter. Stomach/Bowel: Stomach is within normal limits. No evidence of  bowel obstruction. Normal appendix (series 5/image 226). Extensive scattered colonic diverticulosis, without evidence of diverticulitis. Lymphatic: No suspicious abdominopelvic lymphadenopathy. Reproductive: Prostatomegaly, with enlargement of the central gland which indents the base of the bladder, suggesting BPH. Other: Trace pelvic ascites (series 5/image 273). Musculoskeletal: Grade 1 spondylolisthesis at L5-S1, chronic. Mild degenerative changes of the lumbar spine. Review of the MIP images confirms the above findings. IMPRESSION: No evidence of thoracoabdominal aortic aneurysm or dissection. No evidence of pulmonary embolism. Cardiomegaly with mild interstitial edema. Moderate left and small right pleural effusions. Additional ancillary findings as above. Electronically Signed   By: Julian Hy M.D.   On: 03/25/2022 22:43   DG Chest Port 1 View  Result Date: 03/25/2022 CLINICAL DATA:  Chest pain. EXAM: PORTABLE CHEST 1 VIEW COMPARISON:  Chest radiograph dated 10/03/2021. FINDINGS: There is mild cardiomegaly. Mild vascular congestion and edema. No focal consolidation, pleural effusion, or pneumothorax. Atherosclerotic calcification of the aorta. No acute osseous pathology. IMPRESSION: Cardiomegaly with mild vascular congestion and edema. Electronically Signed   By: Anner Crete M.D.   On: 03/25/2022 20:41    Assessment and Plan   Shawn Andrews is a 76yoM with a PMH of CAD ([place] 12/2020 with 80% distal RCA, 50% prox RCA, 55% ost-prox Lcx, and 40% or less RPDA and LAD - medically managed), HFrEF (EF 30-35%, with rWMAs 09/2021), HTN, HLD, CKD 3, BPH, essential tremor and urinary retention with suprapubic catheter since 01/2021 who presented to [facility] ED the evening of 03/25/2022 with substernal chest discomfort persisting despite taking 2 SL nitroglycerin at home. Troponins peaked at 22K   Acute NSTEMI in a patient with underlying CAD: -- Troponins went from 32 to 22,486.  -- Continue IV heparin  drip and monitor heparin level per protocol.   --Continue aspirin, Plavix and rosuvastatin.   --s/p cath by  DR callwood  Coronaries Left main large moderate calcification LAD large proximal 75% lesion with moderate calcification mid LAD with a 90% lesion instent restenosis otherwise widely patent stents extensive prior stents TIMI-3 flow Circumflex is medium with minor irregularities RCA very large proximal ulcerated plaque of around 75% with TIMI-3 flow   Intervention PCI and stent to proximal RCA Placement of 3.5 x 15 mm DES to 15 atm Lesion reduced from 75 down to 0 --Pt will need staged mid LAD and instent restenosis at later date --check BMP in am  Acute exacerbation of chronic systolic CHF  --received IV Lasix.   --Euvolemic. Hold further diuresis   Acute hypoxic respiratory failure: Continue 3 L/min oxygen via [place].  Wean off oxygen as able. --sats 99% on 2liter   BPH with chronic urinary retention s/p suprapubic catheter: -- Continue Proscar and  Cardura    CKD stage IIIb  --creat on admission 1.93--2.13--2.1 --baseline creat 1.5--1.67  Procedures Cardiac cath 03/27/22 Family communication :wife and son at bedside Consults :Cardiology CODE STATUS: FULL DVT Prophylaxis :heparin gtt Level of care: Progressive Status is: Inpatient Remains inpatient appropriate because: NSTEMI    TOTAL TIME TAKING CARE OF THIS PATIENT: 35 minutes.  >50% time spent on counselling and coordination of care  Note: This dictation was prepared with Dragon dictation along with smaller phrase technology. Any transcriptional errors that result from this process are unintentional.  Fritzi Mandes M.D    [facility]   CC: Primary care physician; Leonel Ramsay, MD

## 2022-03-28 DIAGNOSIS — I5023 Acute on chronic systolic (congestive) heart failure: Secondary | ICD-10-CM | POA: Diagnosis not present

## 2022-03-28 DIAGNOSIS — N1832 Chronic kidney disease, stage 3b: Secondary | ICD-10-CM

## 2022-03-28 DIAGNOSIS — I214 Non-ST elevation (NSTEMI) myocardial infarction: Secondary | ICD-10-CM | POA: Diagnosis not present

## 2022-03-28 DIAGNOSIS — G250 Essential tremor: Secondary | ICD-10-CM | POA: Diagnosis not present

## 2022-03-28 LAB — CBC
HCT: 28.2 % — ABNORMAL LOW (ref 39.0–52.0)
Hemoglobin: 9.4 g/dL — ABNORMAL LOW (ref 13.0–17.0)
MCH: 29.1 pg (ref 26.0–34.0)
MCHC: 33.3 g/dL (ref 30.0–36.0)
MCV: 87.3 fL (ref 80.0–100.0)
Platelets: 155 10*3/uL (ref 150–400)
RBC: 3.23 MIL/uL — ABNORMAL LOW (ref 4.22–5.81)
RDW: 13.5 % (ref 11.5–15.5)
WBC: 6.4 10*3/uL (ref 4.0–10.5)
nRBC: 0 % (ref 0.0–0.2)

## 2022-03-28 LAB — BASIC METABOLIC PANEL
Anion gap: 8 (ref 5–15)
BUN: 44 mg/dL — ABNORMAL HIGH (ref 8–23)
CO2: 22 mmol/L (ref 22–32)
Calcium: 8.3 mg/dL — ABNORMAL LOW (ref 8.9–10.3)
Chloride: 106 mmol/L (ref 98–111)
Creatinine, Ser: 1.87 mg/dL — ABNORMAL HIGH (ref 0.61–1.24)
GFR, Estimated: 34 mL/min — ABNORMAL LOW (ref >60)
Glucose, Bld: 103 mg/dL — ABNORMAL HIGH (ref 70–99)
Potassium: 4.7 mmol/L (ref 3.5–5.1)
Sodium: 136 mmol/L (ref 135–145)

## 2022-03-28 LAB — HEPARIN LEVEL (UNFRACTIONATED): Heparin Unfractionated: <0.1 IU/mL — ABNORMAL LOW (ref 0.30–0.70)

## 2022-03-28 LAB — GLUCOSE, CAPILLARY: Glucose-Capillary: 153 mg/dL — ABNORMAL HIGH (ref 70–99)

## 2022-03-28 LAB — POCT ACTIVATED CLOTTING TIME
Activated Clotting Time: 277 seconds
Activated Clotting Time: 298 seconds

## 2022-03-28 MED ORDER — ENOXAPARIN SODIUM 30 MG/0.3ML IJ SOSY
30.0000 mg | PREFILLED_SYRINGE | INTRAMUSCULAR | Status: DC
  Administered 2022-03-28 – 2022-03-30 (×3): 30 mg via SUBCUTANEOUS
  Filled 2022-03-28 (×3): qty 0.3

## 2022-03-28 MED ORDER — SODIUM CHLORIDE 0.9% FLUSH: 3.0000 mL | INTRAVENOUS | Status: DC | PRN

## 2022-03-28 MED ORDER — SODIUM CHLORIDE 0.9 % WEIGHT BASED INFUSION
3.0000 mL/kg/h | INTRAVENOUS | Status: DC
  Administered 2022-03-29: 3 mL/kg/h via INTRAVENOUS

## 2022-03-28 MED ORDER — SODIUM CHLORIDE 0.9% FLUSH
3.0000 mL | Freq: Two times a day (BID) | INTRAVENOUS | Status: DC
  Administered 2022-03-28 – 2022-03-31 (×6): 3 mL via INTRAVENOUS

## 2022-03-28 MED ORDER — SODIUM CHLORIDE 0.9 % WEIGHT BASED INFUSION
1.0000 mL/kg/h | INTRAVENOUS | Status: DC
  Administered 2022-03-29: 1 mL/kg/h via INTRAVENOUS

## 2022-03-28 MED ORDER — SODIUM CHLORIDE 0.9 % IV SOLN: 250.0000 mL | INTRAVENOUS | Status: DC | PRN

## 2022-03-28 NOTE — Progress Notes (Signed)
[facility] [place] NAME: Shawn Andrews    MR#:  [id number]  DATE OF BIRTH:  07/02/1928  SUBJECTIVE:  Wife at bedside OOB to chair. No cp or sob   VITALS:  Blood pressure 116/63, pulse 60, temperature 97.8 F (36.6 C), temperature source Oral, resp. rate (!) 22, height '5\' 10"'$  (1.778 m), weight 80 kg, SpO2 96 %.  PHYSICAL EXAMINATION:   GENERAL:  89 y.o.-year-old patient with no acute distress.  LUNGS: Normal breath sounds bilaterally, no wheezing CARDIOVASCULAR: S1, S2 normal. No murmur   ABDOMEN: Soft, nontender, nondistended. Bowel sounds present.  EXTREMITIES: No  edema b/l.    NEUROLOGIC: nonfocal  patient is alert and awake SKIN: No obvious rash, lesion, or ulcer. Brusies + UE  LABORATORY PANEL:  CBC Recent Labs  Lab 03/28/22 0433  WBC 6.4  HGB 9.4*  HCT 28.2*  PLT 155     Chemistries  Recent Labs  Lab 03/25/22 2019 03/26/22 1211 03/27/22 0418 03/28/22 0433  NA 133*   < > 136 136  K 4.6   < > 4.8 4.7  CL 104   < > 106 106  CO2 20*   < > 24 22  GLUCOSE 177*   < > 115* 103*  BUN 43*   < > 49* 44*  CREATININE 1.93*   < > 2.10* 1.87*  CALCIUM 8.9   < > 8.4* 8.3*  MG  --   --  2.2  --   AST 18  --   --   --   ALT 12  --   --   --   ALKPHOS 68  --   --   --   BILITOT 0.4  --   --   --    < > = values in this interval not displayed.    Cardiac Enzymes No results for input(s): "TROPONINI" in the last 168 hours. RADIOLOGY:  No results found.  Assessment and Plan   Shawn Andrews is a 89yoM with a PMH of CAD ([place] 12/2020 with 80% distal RCA, 50% prox RCA, 55% ost-prox Lcx, and 40% or less RPDA and LAD - medically managed), HFrEF (EF 30-35%, with rWMAs 09/2021), HTN, HLD, CKD 3, BPH, essential tremor and urinary retention with suprapubic catheter since 01/2021 who presented to [facility] ED the evening of 03/25/2022 with substernal chest discomfort persisting despite taking 2 SL nitroglycerin at home. Troponins peaked at  22K   Acute NSTEMI in a patient with underlying CAD: -- Troponins went from 32 to 22,486.  --Continue IV heparin drip and monitor heparin level per protocol--now d/ced post-cath --Continue aspirin, Plavix and rosuvastatin.   --s/p cath by  DR callwood   Coronaries Left main large moderate calcification LAD large proximal 75% lesion with moderate calcification mid LAD with a 90% lesion instent restenosis otherwise widely patent stents extensive prior stents TIMI-3 flow Circumflex is medium with minor irregularities RCA very large proximal ulcerated plaque of around 75% with TIMI-3 flow Intervention PCI and stent to proximal RCA Placement of 3.5 x 15 mm DES to 15 atm Lesion reduced from 75 down to 0 --Pt will need staged mid LAD and instent restenosis likley tomorrow per Cardiology PA --Creat down to 1.8  Acute exacerbation of chronic systolic CHF  --received IV Lasix.   --Euvolemic. Hold further diuresis   Acute hypoxic respiratory failure: Continue 3 L/min oxygen via [place].  Wean off oxygen as able. --sats 99% on  2liter   BPH with chronic urinary retention s/p suprapubic catheter: - onProscar and Cardura    CKD stage IIIb  --creat on admission 1.93--2.13--2.1--1.87 --baseline creat 1.5--1.67  Procedures Cardiac cath 03/27/22 Family communication :wife at bedside Consults :Cardiology CODE STATUS: FULL DVT Prophylaxis :enoxaparin Level of care: Progressive Status is: Inpatient Remains inpatient appropriate because: NSTEMI    TOTAL TIME TAKING CARE OF THIS PATIENT: 35 minutes.  >50% time spent on counselling and coordination of care  Note: This dictation was prepared with Dragon dictation along with smaller phrase technology. Any transcriptional errors that result from this process are unintentional.  Fritzi Mandes M.D    [facility]   CC: Primary care physician; Leonel Ramsay, MD

## 2022-03-28 NOTE — Progress Notes (Signed)
[facility] NOTE       Patient ID: [name] MRN: [id number] DOB/AGE: 87-03-1928 87 y.o.  Admit date: 03/25/2022 Referring Physician Dr. Marjean Donna Primary Physician Dr. Ola Spurr Primary Cardiologist Dr. Clayborn Bigness Reason for Consultation NSTEMI  HPI: Shawn Andrews is a 6yoM with a PMH of CAD ([place] 12/2020 with 80% distal RCA, 50% prox RCA, 55% ost-prox Lcx, and 40% or less RPDA and LAD - medically managed), HFrEF (EF 30-35%, with rWMAs 09/2021), HTN, HLD, CKD 3, BPH, essential tremor and urinary retention with suprapubic catheter since 01/2021 who presented to [facility] ED the evening of 03/25/2022 with substernal chest discomfort persisting despite taking 2 SL nitroglycerin at home. Troponins peaked at 22K. Cardiology is consulted for assistance with his NSTEMI. LHC 03/27/2022 which revealed 75% stenosis and ulcerated plaque of the RCA, and 90% mid LAD in-stent restenosis.  S/p PCI with DES to the RCA, plan for staged intervention of LAD ISR on 03/29/2022.  Interval History:  -Remains chest pain-free, feels "great today." -Eager to proceed with staged intervention tomorrow -Right wrist arteriotomy with small raised hematoma that appears to be stable overnight. -Renal function fortunately also stable post procedurally  Review of systems complete and found to be negative unless listed above     Past Medical History:  Diagnosis Date   Abnormal drug screen 06/22/2014   inapprop neg xanax rpt 3 mo (06/2014)   BPH (benign prostatic hypertrophy) 01/21/1998   has had 3 biopsies in past ([facility]) decided to stop PSA/DRE   CHF (congestive heart failure) (HCC)    Coronary artery disease    Hyperlipidemia 01/21/2002   Hypertension 05/22/2003   Left lumbar radiculopathy    Osteoarthritis 01/22/1988   knees, lumbar spondylosis and listhesis    Past Surgical History:  Procedure Laterality Date   CATARACT EXTRACTION  2013   bilateral   ESI Left 08/2014, 11/2014, 08/2015   L  S1, L L5/S1 transforaminal ESI; L4/5 L5/S1 zygapophysial injections, L S1 transforaminal ([facility])   FINGER SURGERY     right middle, partial traumatic amputation   IR CATHETER TUBE CHANGE  03/02/2021   KNEE ARTHROSCOPY  1990   right   KNEE SURGERY  04/2006   right partial knee replacement in [place] - rec ppx abx for any invasive procedure   LEFT HEART CATH AND CORONARY ANGIOGRAPHY N/A 01/18/2021   Procedure: LEFT HEART CATH AND CORONARY ANGIOGRAPHY;  Surgeon: Corey Skains, MD;  Location: [facility];  Service: Cardiovascular;  Laterality: N/A;    Medications Prior to Admission  Medication Sig Dispense Refill Last Dose   acetaminophen (TYLENOL 8 HOUR) 650 MG CR tablet Take 1 tablet (650 mg total) by mouth 2 (two) times a day.   03/25/2022 at am   aspirin 81 MG chewable tablet Chew 1 tablet by mouth daily.   03/25/2022   Cholecalciferol (VITAMIN D3) 25 MCG (1000 UT) CAPS Take 1 capsule (1,000 Units total) by mouth daily. 30 capsule  03/25/2022 at am   clopidogrel (PLAVIX) 75 MG tablet Take 75 mg by mouth daily.   03/25/2022   cyanocobalamin 1000 MCG tablet Take 1,000 mcg by mouth daily.   03/25/2022   docusate sodium (COLACE) 100 MG capsule Take 1 capsule (100 mg total) by mouth daily.   03/25/2022 at am   doxazosin (CARDURA) 4 MG tablet TAKE 1 TABLET BY MOUTH  DAILY (Patient taking differently: Take 4 mg by mouth daily. Take 1 tablet by mouth  daily) 90  tablet 3 03/25/2022   famotidine (PEPCID) 20 MG tablet TAKE 1 TABLET BY MOUTH AT  BEDTIME (Patient taking differently: Take 20 mg by mouth at bedtime.) 90 tablet 3 Past Week   finasteride (PROSCAR) 5 MG tablet TAKE 1 TABLET BY MOUTH  DAILY (Patient taking differently: Take 5 mg by mouth daily.) 90 tablet 3 03/25/2022   furosemide (LASIX) 20 MG tablet Take 1 tablet by mouth every other day.   Past Week   hydrALAZINE (APRESOLINE) 25 MG tablet Take 25 mg by mouth 3 (three) times daily.   03/25/2022   isosorbide mononitrate (IMDUR) 30 MG 24 hr tablet  Take 30 mg by mouth daily.   03/25/2022   loratadine (CLARITIN) 10 MG tablet Take 1 tablet (10 mg total) by mouth daily as needed for allergies. 30 tablet  unknown   metoprolol tartrate (LOPRESSOR) 50 MG tablet Take 0.5 tablets (25 mg total) by mouth 2 (two) times daily.   03/25/2022   nitroGLYCERIN (NITROSTAT) 0.4 MG SL tablet Place under the tongue.   unknown   pantoprazole (PROTONIX) 40 MG tablet Take 40 mg by mouth 2 (two) times daily.   03/25/2022   polyethylene glycol powder (GLYCOLAX/MIRALAX) 17 GM/SCOOP powder Take 8.5-17 g by mouth daily as needed for moderate constipation. 3350 g 0 unknown   primidone (MYSOLINE) 50 MG tablet Take 2 tablets (100 mg total) by mouth 3 (three) times daily.   03/25/2022   rosuvastatin (CRESTOR) 20 MG tablet Take 1 tablet (20 mg total) by mouth at bedtime. 30 tablet 1 Past Week   Social History   Socioeconomic History   Marital status: Married    Spouse name: Not on file   Number of children: Not on file   Years of education: Not on file   Highest education level: Not on file  Occupational History   Occupation: retired Event organiser, Facilities manager, owns lawn care business  Tobacco Use   Smoking status: Former    Packs/day: 0.00    Years: 0.00    Total pack years: 0.00    Types: Pipe, Cigarettes    Quit date: 12/26/2000    Years since quitting: 21.2    Passive exposure: Past   Smokeless tobacco: Current    Types: Chew   Tobacco comments:    quit over 30 years ago  Vaping Use   Vaping Use: Never used  Substance and Sexual Activity   Alcohol use: Yes    Alcohol/week: 0.0 standard drinks of alcohol    Comment: Occasional beer   Drug use: No   Sexual activity: Not on file  Other Topics Concern   Not on file  Social History Narrative   Widowed, remarried 30-Jul-2007.   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 [employer]    Activity: walks 1.5 miles 3d/wk   Diet:  good water, fruits/vegetables daily   Social Determinants of Health   Financial Resource Strain: Not on file  Food Insecurity: No Food Insecurity (03/26/2022)   Hunger Vital Sign    Worried About Running Out of Food in the Last Year: Never true    [place] in the Last Year: Never true  Transportation Needs: No Transportation Needs (03/26/2022)   PRAPARE - Hydrologist (Medical): No    Lack of Transportation (Non-Medical): No  Physical Activity: Not on file  Stress: Not on file  Social Connections: Not on file  Intimate Partner Violence: Not At Risk (03/26/2022)   Humiliation, Afraid, Rape, and Kick questionnaire    Fear of Current or Ex-Partner: No    Emotionally Abused: No    Physically Abused: No    Sexually Abused: No    Family History  Problem Relation Age of Onset   Hypertension Mother    Heart disease Brother 78       MI   Diabetes Brother    Cancer Daughter        melanoma   Stroke Neg Hx       Intake/Output Summary (Last 24 hours) at 03/28/2022 0813 Last data filed at 03/27/2022 1924 Gross per 24 hour  Intake 720 ml  Output 800 ml  Net -80 ml     Vitals:   03/27/22 2300 03/28/22 0308 03/28/22 0335 03/28/22 0808  BP: 117/67 102/86  (!) 118/96  Pulse: 68 64  73  Resp: 18 (!) 25  20  Temp: 98 F (36.7 C) 98 F (36.7 C)  97.8 F (36.6 C)  TempSrc: Oral Oral  Oral  SpO2: 100% 99%  91%  Weight:   80 kg   Height:        PHYSICAL EXAM General: Pleasant elderly Caucasian male, well nourished, in no acute distress.  Sitting in recliner with wife at bedside HEENT:  Normocephalic and atraumatic. Neck:  No JVD.  Lungs: Normal respiratory effort on oxygen by nasal cannula.  Decreased breath sounds with trace crackles, no wheezing Heart: HRRR . Normal S1 and S2 without gallops or murmurs.  Abdomen: Non-distended appearing. Msk: Normal strength and tone for age. Extremities: Warm and well perfused. No clubbing, cyanosis. Trace  bilateral peripheral edema.  Right wrist with small hematoma at arteriotomy site, covered with gauze and Tegaderm, radial pulse 2+ proximal and distally Neuro: Alert and oriented X 3. Psych:  Answers questions appropriately.   Labs: Basic Metabolic Panel: Recent Labs    03/27/22 0418 03/28/22 0433  NA 136 136  K 4.8 4.7  CL 106 106  CO2 24 22  GLUCOSE 115* 103*  BUN 49* 44*  CREATININE 2.10* 1.87*  CALCIUM 8.4* 8.3*  MG 2.2  --     Liver Function Tests: Recent Labs    03/25/22 2019  AST 18  ALT 12  ALKPHOS 68  BILITOT 0.4  PROT 6.8  ALBUMIN 4.4    Recent Labs    03/25/22 2019  LIPASE 46    CBC: Recent Labs    03/27/22 0418 03/28/22 0433  WBC 7.2 6.4  HGB 9.7* 9.4*  HCT 28.9* 28.2*  MCV 89.5 87.3  PLT 158 155    Cardiac Enzymes: Recent Labs    03/25/22 2019 03/25/22 2218 03/26/22 0904  TROPONINIHS 32* 262* 22,486*    BNP: Recent Labs    03/25/22 2019  BNP 598.2*    D-Dimer: No results for input(s): "DDIMER" in the last 72 hours. Hemoglobin A1C: No results for input(s): "HGBA1C" in the last 72 hours. Fasting Lipid Panel: No results for input(s): "CHOL", "HDL", "LDLCALC", "TRIG", "CHOLHDL", "LDLDIRECT" in the last 72 hours. Thyroid Function Tests: No results for input(s): "TSH", "T4TOTAL", "T3FREE", "THYROIDAB" in the last 72 hours.  Invalid input(s): "FREET3" Anemia Panel: No results for input(s): "VITAMINB12", "FOLATE", "FERRITIN", "TIBC", "IRON", "RETICCTPCT" in the last 72 hours.   Radiology: ECHOCARDIOGRAM COMPLETE  Result Date: 03/26/2022    ECHOCARDIOGRAM REPORT   Patient Name:   Shawn Andrews Date of Exam: 03/26/2022 Medical Rec #:  [id number]  Height:       70.0 in Accession #:    [id number]    Weight:       187.2 lb Date of Birth:  [birth year]     BSA:          2.029 m Patient Age:    61 years      BP:           108/54 mmHg Patient Gender: M             HR:           66 bpm. Exam Location:  [facility] Procedure: 2D Echo, Cardiac Doppler  and Color Doppler Indications:     Chest Pain  History:         Patient has prior history of Echocardiogram examinations, most                  recent 10/04/2021. CHF, CAD and Acute MI, Arrythmias:PVC,                  Signs/Symptoms:Chest Pain; Risk Factors:Hypertension and                  Dyslipidemia.  Sonographer:     Wenda Low Referring Phys:  [id number] [place] Sharrell Krawiec Diagnosing Phys: Yolonda Kida MD IMPRESSIONS  1. Ant/Apical hypo.  2. Left ventricular ejection fraction, by estimation, is 30 to 35%. The left ventricle has moderately decreased function. The left ventricle demonstrates regional wall motion abnormalities (see scoring diagram/findings for description). The left ventricular internal cavity size was moderately dilated. There is mild concentric left ventricular hypertrophy. Left ventricular diastolic parameters are consistent with Grade I diastolic dysfunction (impaired relaxation).  3. Right ventricular systolic function is low normal. The right ventricular size is mildly enlarged. Mildly increased right ventricular wall thickness. There is severely elevated pulmonary artery systolic pressure.  4. Left atrial size was moderately dilated.  5. Right atrial size was mildly dilated.  6. The mitral valve is normal in structure. Mild mitral valve regurgitation.  7. Tricuspid valve regurgitation is mild to moderate.  8. The aortic valve is grossly normal. Aortic valve regurgitation is not visualized. FINDINGS  Left Ventricle: Left ventricular ejection fraction, by estimation, is 30 to 35%. The left ventricle has moderately decreased function. The left ventricle demonstrates regional wall motion abnormalities. The left ventricular internal cavity size was moderately dilated. There is mild concentric left ventricular hypertrophy. Left ventricular diastolic parameters are consistent with Grade I diastolic dysfunction (impaired relaxation). Right Ventricle: The right ventricular size is mildly  enlarged. Mildly increased right ventricular wall thickness. Right ventricular systolic function is low normal. There is severely elevated pulmonary artery systolic pressure. The tricuspid regurgitant  velocity is 3.68 m/s, and with an assumed right atrial pressure of 8 mmHg, the estimated right ventricular systolic pressure is 0000000 mmHg. Left Atrium: Left atrial size was moderately dilated. Right Atrium: Right atrial size was mildly dilated. Pericardium: There is no evidence of pericardial effusion. Mitral Valve: The mitral valve is normal in structure. Mild mitral valve regurgitation. MV peak gradient, 3.4 mmHg. The mean mitral valve gradient is 1.0 mmHg. Tricuspid Valve: The tricuspid valve is normal in structure. Tricuspid valve regurgitation is mild to moderate. Aortic Valve: The aortic valve is grossly normal. Aortic valve regurgitation is not visualized. Aortic valve mean gradient measures 3.5 mmHg. Aortic valve peak gradient measures 7.2 mmHg. Aortic valve area, by VTI measures 3.00 cm. Pulmonic Valve: The pulmonic valve was grossly normal. Pulmonic  valve regurgitation is not visualized. Aorta: The ascending aorta was not well visualized. IAS/Shunts: No atrial level shunt detected by color flow Doppler. Additional Comments: Ant/Apical hypo.  LEFT VENTRICLE PLAX 2D LVIDd:         5.30 cm   Diastology LVIDs:         4.40 cm   LV e' medial:    5.22 cm/s LV PW:         1.30 cm   LV E/e' medial:  12.5 LV IVS:        1.20 cm   LV e' lateral:   10.70 cm/s LVOT diam:     2.10 cm   LV E/e' lateral: 6.1 LV SV:         96 LV SV Index:   47 LVOT Area:     3.46 cm  RIGHT VENTRICLE RV Basal diam:  4.35 cm RV Mid diam:    3.70 cm RV S prime:     16.90 cm/s LEFT ATRIUM              Index        RIGHT ATRIUM           Index LA diam:        5.10 cm  2.51 cm/m   RA Area:     21.30 cm LA Vol (A2C):   97.6 ml  48.09 ml/m  RA Volume:   65.90 ml  32.47 ml/m LA Vol (A4C):   102.0 ml 50.26 ml/m LA Biplane Vol: 107.0 ml 52.73  ml/m  AORTIC VALVE                    PULMONIC VALVE AV Area (Vmax):    3.10 cm     PV Vmax:       1.08 m/s AV Area (Vmean):   2.85 cm     PV Peak grad:  4.7 mmHg AV Area (VTI):     3.00 cm AV Vmax:           134.00 cm/s AV Vmean:          83.950 cm/s AV VTI:            0.321 m AV Peak Grad:      7.2 mmHg AV Mean Grad:      3.5 mmHg LVOT Vmax:         120.00 cm/s LVOT Vmean:        69.100 cm/s LVOT VTI:          0.278 m LVOT/AV VTI ratio: 0.87  AORTA Ao Root diam: 3.30 cm MITRAL VALVE               TRICUSPID VALVE MV Area (PHT): 4.08 cm    TR Peak grad:   54.2 mmHg MV Area VTI:   3.06 cm    TR Vmax:        368.00 cm/s MV Peak grad:  3.4 mmHg MV Mean grad:  1.0 mmHg    SHUNTS MV Vmax:       0.93 m/s    Systemic VTI:  0.28 m MV Vmean:      52.2 cm/s   Systemic Diam: 2.10 cm MV Decel Time: 186 msec MV E velocity: 65.00 cm/s MV A velocity: 89.60 cm/s MV E/A ratio:  0.73 Dwayne D Callwood MD Electronically signed by Yolonda Kida MD Signature Date/Time: 03/26/2022/2:00:05 PM    Final    CT Angio Chest/Abd/Pel for Dissection W  and/or Wo Contrast  Result Date: 03/25/2022 CLINICAL DATA:  Midsternal chest pain EXAM: CT ANGIOGRAPHY CHEST, ABDOMEN AND PELVIS TECHNIQUE: Non-contrast CT of the chest was initially obtained. Multidetector CT imaging through the chest, abdomen and pelvis was performed using the standard protocol during bolus administration of intravenous contrast. Multiplanar reconstructed images and MIPs were obtained and reviewed to evaluate the vascular anatomy. RADIATION DOSE REDUCTION: This exam was performed according to the departmental dose-optimization program which includes automated exposure control, adjustment of the mA and/or kV according to patient size and/or use of iterative reconstruction technique. CONTRAST:  70m OMNIPAQUE IOHEXOL 350 MG/ML SOLN COMPARISON:  CT abdomen/pelvis dated 02/25/2021. CT chest dated 01/09/2021. FINDINGS: CTA CHEST FINDINGS Cardiovascular: On unenhanced CT,  there is no evidence of intramural hematoma. Following contrast administration, there is no evidence of thoracic aortic aneurysm or dissection. Atherosclerotic calcifications of the arch. Although not tailored for evaluation of the pulmonary arteries, there is no evidence of pulmonary embolism to the lobar level. Mild cardiomegaly.  No pericardial effusion. Severe three-vessel coronary atherosclerosis. Mediastinum/Nodes: No suspicious mediastinal lymphadenopathy. Visualized thyroid is unremarkable. Lungs/Pleura: Moderate left and small right pleural effusions. Additional faint ground-glass opacities in the lungs bilaterally, with mild interlobular septal thickening in the upper lobes, favoring mild interstitial edema. Mild dependent atelectasis in the posterior upper and lower lobes. No focal consolidation. No pneumothorax. Musculoskeletal: Degenerative changes of the mid/lower thoracic spine. Review of the MIP images confirms the above findings. CTA ABDOMEN AND PELVIS FINDINGS VASCULAR Aorta: No evidence of abdominal aortic aneurysm or dissection. Patent. Atherosclerotic calcifications. Celiac: Patent.  Atherosclerotic calcifications. SMA: Patent.  Atherosclerotic calcifications at the origin. Renals: Patent. Atherosclerotic calcifications at the origin bilaterally. IMA: Patent. Inflow: Patent bilaterally.  Atherosclerotic calcifications. Veins: Grossly unremarkable. Review of the MIP images confirms the above findings. NON-VASCULAR Hepatobiliary: Liver is within normal limits. Gallbladder is unremarkable. No intrahepatic or extrahepatic ductal dilatation. Pancreas: Within normal limits. Spleen: Within normal limits. Adrenals/Urinary Tract: Adrenal glands are within normal limits. Bilateral renal cysts, measuring up to 3.2 cm in the left upper kidney (series 5/image 152), benign (Bosniak I). No follow-up is recommended. No hydronephrosis. Bladder is not decompressed with an indwelling suprapubic Foley catheter.  Stomach/Bowel: Stomach is within normal limits. No evidence of bowel obstruction. Normal appendix (series 5/image 226). Extensive scattered colonic diverticulosis, without evidence of diverticulitis. Lymphatic: No suspicious abdominopelvic lymphadenopathy. Reproductive: Prostatomegaly, with enlargement of the central gland which indents the base of the bladder, suggesting BPH. Other: Trace pelvic ascites (series 5/image 273). Musculoskeletal: Grade 1 spondylolisthesis at L5-S1, chronic. Mild degenerative changes of the lumbar spine. Review of the MIP images confirms the above findings. IMPRESSION: No evidence of thoracoabdominal aortic aneurysm or dissection. No evidence of pulmonary embolism. Cardiomegaly with mild interstitial edema. Moderate left and small right pleural effusions. Additional ancillary findings as above. Electronically Signed   By: SJulian HyM.D.   On: 03/25/2022 22:43   DG Chest Port 1 View  Result Date: 03/25/2022 CLINICAL DATA:  Chest pain. EXAM: PORTABLE CHEST 1 VIEW COMPARISON:  Chest radiograph dated 10/03/2021. FINDINGS: There is mild cardiomegaly. Mild vascular congestion and edema. No focal consolidation, pleural effusion, or pneumothorax. Atherosclerotic calcification of the aorta. No acute osseous pathology. IMPRESSION: Cardiomegaly with mild vascular congestion and edema. Electronically Signed   By: AAnner CreteM.D.   On: 03/25/2022 20:41    ECHO 09/2021  1. Left ventricular ejection fraction, by estimation, is 30 to 35%. The  left ventricle has moderately decreased function. The left  ventricle  demonstrates regional wall motion abnormalities (see scoring  diagram/findings for description). Left ventricular   diastolic parameters were normal.   2. Right ventricular systolic function is normal. The right ventricular  size is normal.   3. The mitral valve is normal in structure. Mild mitral valve  regurgitation. No evidence of mitral stenosis.   4. The aortic  valve is normal in structure. Aortic valve regurgitation is  mild. No aortic stenosis is present.   5. The inferior vena cava is normal in size with greater than 50%  respiratory variability, suggesting right atrial pressure of 3 mmHg.   Leane Call 01/08/22 Impression   Abnormal myocardial perfusion scan left ventricular enlargement globally depressed ejection fraction of 35% there is a moderate to large area of anterior apical persistent defect consistent with scar there is also RV uptake consistent with systolic cardiomyopathy.  No clear evidence of reversible ischemia.  Conclusion this is a high risk study  TELEMETRY reviewed by me (LT) 03/28/2022 : Sinus rhythm rate 60s  EKG reviewed by me: NSR lateral ST depressions  Data reviewed by me (LT) 03/28/2022: Hospitalist progress note, nursing notes last 24h vitals tele labs imaging I/O    Principal Problem:   NSTEMI (non-ST elevated myocardial infarction) [facility]) Active Problems:   Essential hypertension   Essential tremor   Chronic kidney disease, stage 3b ([place])   Memory deficit   HFrEF (heart failure with reduced ejection fraction) (HCC)   Acute on chronic systolic CHF (congestive heart failure) ([place])   Suprapubic catheter ([place])   Frequent PVCs    ASSESSMENT AND PLAN:  Shawn Andrews is a 55yoM with a PMH of CAD ([place] 12/2020 with 80% distal RCA, 50% prox RCA, 55% ost-prox Lcx, and 40% or less RPDA and LAD - medically managed), HFrEF (EF 30-35%, with rWMAs 09/2021), HTN, HLD, CKD 3, BPH, essential tremor and urinary retention with suprapubic catheter since 01/2021 who presented to [facility] ED the evening of 03/25/2022 with substernal chest discomfort persisting despite taking 2 SL nitroglycerin at home. Troponins peaked at 22K. Cardiology is consulted for assistance with his NSTEMI.  # NSTEMI  # CAD s/p PCI with DES to RCA 03/27/2022, 90% ISR of mid LAD stent Presented with severe substernal chest discomfort after eating a baked potato  for dinner on 3/4, eventual relief with nitro and heparin infusion started in the emergency department.  EKG with lateral ST depressions, troponin with current peak 22,400, concerning for non-STEMI.  Chest pain-free at my time of evaluation while on a nitro gtt. -S/p 325 mg aspirin, 81 mg aspirin and clopidogrel 75 mg daily -Continue heparin drip until LHC -Continue metoprolol tartrate 12.5 mg twice daily -s/p nitroglycerin infusion -Consider Imdur 90 mg daily  -Consider addition of ranexa  -echo complete resulted similarly to prior 6 months ago -Plan for staged intervention of mid LAD ISR tomorrow, 3/8 with Dr. Lujean Amel.  The patient and his wife are agreeable and eager to proceed.  N.p.o. after midnight tonight.  # acute on chronic HFrEF (30-35%, rWMAs) Not clinically hypervolemic on exam, bnp slightly up at 600. S/p IV lasix '40mg'$  x1 and '20mg'$  x 1.  -hold further diuresis for now -Continue KVO fluids starting tomorrow morning pre and post procedurally - continue metoprolol 12.'5mg'$  BID, consider ARB/MRA/SGLT2i as renal function and BP allow.   # CKD 3 BUN/Cr on admission 43/1.93 and GFR 32, fortunately recovered after IV fluids  This patient's plan of care was discussed and created with Dr.  Callwood and he is in agreement.  Signed: Tristan Schroeder , PA-C 03/28/2022, 8:13 AM [facility]

## 2022-03-29 ENCOUNTER — Encounter
Admission: EM | Disposition: A | Discharge status: Home / Self Care | Payer: Self-pay | Source: Home / Self Care | Attending: Internal Medicine

## 2022-03-29 ENCOUNTER — Other Ambulatory Visit: Payer: Self-pay

## 2022-03-29 DIAGNOSIS — N1832 Chronic kidney disease, stage 3b: Secondary | ICD-10-CM | POA: Diagnosis not present

## 2022-03-29 DIAGNOSIS — G250 Essential tremor: Secondary | ICD-10-CM | POA: Diagnosis not present

## 2022-03-29 DIAGNOSIS — I5023 Acute on chronic systolic (congestive) heart failure: Secondary | ICD-10-CM | POA: Diagnosis not present

## 2022-03-29 DIAGNOSIS — I214 Non-ST elevation (NSTEMI) myocardial infarction: Secondary | ICD-10-CM | POA: Diagnosis not present

## 2022-03-29 HISTORY — PX: CORONARY ANGIOGRAPHY: CATH118303

## 2022-03-29 HISTORY — PX: CORONARY STENT INTERVENTION: CATH118234

## 2022-03-29 LAB — BASIC METABOLIC PANEL
Anion gap: 8 (ref 5–15)
BUN: 43 mg/dL — ABNORMAL HIGH (ref 8–23)
CO2: 22 mmol/L (ref 22–32)
Calcium: 8.5 mg/dL — ABNORMAL LOW (ref 8.9–10.3)
Chloride: 107 mmol/L (ref 98–111)
Creatinine, Ser: 1.95 mg/dL — ABNORMAL HIGH (ref 0.61–1.24)
GFR, Estimated: 32 mL/min — ABNORMAL LOW (ref >60)
Glucose, Bld: 100 mg/dL — ABNORMAL HIGH (ref 70–99)
Potassium: 4.4 mmol/L (ref 3.5–5.1)
Sodium: 137 mmol/L (ref 135–145)

## 2022-03-29 LAB — POCT ACTIVATED CLOTTING TIME
Activated Clotting Time: 260 seconds
Activated Clotting Time: 276 seconds

## 2022-03-29 LAB — CARDIAC CATHETERIZATION: Cath EF Quantitative: 35 %

## 2022-03-29 SURGERY — CORONARY STENT INTERVENTION
Anesthesia: Moderate Sedation

## 2022-03-29 MED ORDER — HEPARIN SODIUM (PORCINE) 1000 UNIT/ML IJ SOLN
INTRAMUSCULAR | Status: AC
  Filled 2022-03-29: qty 10

## 2022-03-29 MED ORDER — HYDRALAZINE HCL 20 MG/ML IJ SOLN: 10.0000 mg | INTRAMUSCULAR | Status: AC | PRN

## 2022-03-29 MED ORDER — VERAPAMIL HCL 2.5 MG/ML IV SOLN
INTRAVENOUS | Status: AC
  Filled 2022-03-29: qty 2

## 2022-03-29 MED ORDER — NITROGLYCERIN 0.4 MG SL SUBL
SUBLINGUAL_TABLET | SUBLINGUAL | Status: DC | PRN
  Administered 2022-03-29: .4 mg via SUBLINGUAL

## 2022-03-29 MED ORDER — ROSUVASTATIN CALCIUM 10 MG PO TABS
40.0000 mg | ORAL_TABLET | Freq: Every day | ORAL | Status: DC
  Administered 2022-03-29 – 2022-03-30 (×2): 40 mg via ORAL
  Filled 2022-03-29: qty 2
  Filled 2022-03-29 (×2): qty 4

## 2022-03-29 MED ORDER — SODIUM CHLORIDE 0.9 % IV SOLN: INTRAVENOUS | Status: DC

## 2022-03-29 MED ORDER — FENTANYL CITRATE (PF) 100 MCG/2ML IJ SOLN
INTRAMUSCULAR | Status: AC
  Filled 2022-03-29: qty 2

## 2022-03-29 MED ORDER — SODIUM CHLORIDE 0.9 % IV SOLN: 250.0000 mL | INTRAVENOUS | Status: DC | PRN

## 2022-03-29 MED ORDER — MIDAZOLAM HCL 2 MG/2ML IJ SOLN
INTRAMUSCULAR | Status: AC
  Filled 2022-03-29: qty 2

## 2022-03-29 MED ORDER — METOPROLOL TARTRATE 25 MG PO TABS
25.0000 mg | ORAL_TABLET | Freq: Two times a day (BID) | ORAL | Status: DC
  Administered 2022-03-29 – 2022-03-31 (×4): 25 mg via ORAL
  Filled 2022-03-29 (×2): qty 1
  Filled 2022-03-29: qty 0.5
  Filled 2022-03-29 (×2): qty 1

## 2022-03-29 MED ORDER — ACETAMINOPHEN 325 MG PO TABS: 650.0000 mg | ORAL_TABLET | ORAL | Status: DC | PRN

## 2022-03-29 MED ORDER — SODIUM CHLORIDE 0.9% FLUSH
3.0000 mL | Freq: Two times a day (BID) | INTRAVENOUS | Status: DC
  Administered 2022-03-30 (×3): 3 mL via INTRAVENOUS

## 2022-03-29 MED ORDER — CLOPIDOGREL BISULFATE 75 MG PO TABS
ORAL_TABLET | ORAL | Status: AC
  Filled 2022-03-29: qty 4

## 2022-03-29 MED ORDER — SODIUM CHLORIDE 0.9% FLUSH: 3.0000 mL | INTRAVENOUS | Status: DC | PRN

## 2022-03-29 MED ORDER — LABETALOL HCL 5 MG/ML IV SOLN: 10.0000 mg | INTRAVENOUS | Status: AC | PRN

## 2022-03-29 MED ORDER — SODIUM CHLORIDE 0.9 % WEIGHT BASED INFUSION: 1.0000 mL/kg/h | INTRAVENOUS | Status: DC

## 2022-03-29 MED ORDER — HEPARIN (PORCINE) IN NACL 1000-0.9 UT/500ML-% IV SOLN
INTRAVENOUS | Status: AC
  Filled 2022-03-29: qty 1000

## 2022-03-29 MED ORDER — ONDANSETRON HCL 4 MG/2ML IJ SOLN: 4.0000 mg | Freq: Four times a day (QID) | INTRAMUSCULAR | Status: DC | PRN

## 2022-03-29 MED ORDER — CLOPIDOGREL BISULFATE 75 MG PO TABS
75.0000 mg | ORAL_TABLET | Freq: Every day | ORAL | Status: DC
  Administered 2022-03-30 – 2022-03-31 (×2): 75 mg via ORAL
  Filled 2022-03-29 (×2): qty 1

## 2022-03-29 MED ORDER — NITROGLYCERIN 0.4 MG SL SUBL
SUBLINGUAL_TABLET | SUBLINGUAL | Status: AC
  Filled 2022-03-29: qty 1

## 2022-03-29 MED ORDER — ASPIRIN 81 MG PO TBEC
81.0000 mg | DELAYED_RELEASE_TABLET | Freq: Every day | ORAL | Status: DC
  Administered 2022-03-30 – 2022-03-31 (×2): 81 mg via ORAL
  Filled 2022-03-29 (×2): qty 1

## 2022-03-29 MED ORDER — ASPIRIN 81 MG PO CHEW: 81.0000 mg | CHEWABLE_TABLET | Freq: Every day | ORAL | Status: DC

## 2022-03-29 MED ORDER — FAMOTIDINE 20 MG PO TABS
ORAL_TABLET | ORAL | Status: DC | PRN
  Administered 2022-03-29: 20 mg via ORAL

## 2022-03-29 MED ORDER — CLOPIDOGREL BISULFATE 75 MG PO TABS
ORAL_TABLET | ORAL | Status: DC | PRN
  Administered 2022-03-29: 300 mg via ORAL

## 2022-03-29 MED ORDER — FAMOTIDINE 20 MG PO TABS
ORAL_TABLET | ORAL | Status: AC
  Filled 2022-03-29: qty 1

## 2022-03-29 MED ORDER — VERAPAMIL HCL 2.5 MG/ML IV SOLN
INTRAVENOUS | Status: DC | PRN
  Administered 2022-03-29: 2.5 mg via INTRA_ARTERIAL

## 2022-03-29 MED ORDER — IOHEXOL 300 MG/ML  SOLN
INTRAMUSCULAR | Status: DC | PRN
  Administered 2022-03-29: 194 mL

## 2022-03-29 MED ORDER — HEPARIN SODIUM (PORCINE) 1000 UNIT/ML IJ SOLN
INTRAMUSCULAR | Status: DC | PRN
  Administered 2022-03-29: 5000 [IU] via INTRAVENOUS
  Administered 2022-03-29: 9000 [IU] via INTRAVENOUS

## 2022-03-29 MED ORDER — HYDRALAZINE HCL 25 MG PO TABS
25.0000 mg | ORAL_TABLET | Freq: Three times a day (TID) | ORAL | Status: DC
  Administered 2022-03-29 – 2022-03-30 (×2): 25 mg via ORAL
  Filled 2022-03-29 (×3): qty 1

## 2022-03-29 SURGICAL SUPPLY — 17 items
BALLN TREK RX 2.5X15 (BALLOONS) ×1 IMPLANT
BALLOON TREK RX 2.5X15 (BALLOONS) IMPLANT
CATH INFINITI JR4 5F (CATHETERS) IMPLANT
CATH VISTA GUIDE 6FR XBLAD3.5 (CATHETERS) IMPLANT
DEVICE RAD TR BAND REGULAR (VASCULAR PRODUCTS) IMPLANT
DRAPE BRACHIAL (DRAPES) IMPLANT
GLIDESHEATH SLEND SS 6F .021 (SHEATH) IMPLANT
GUIDEWIRE INQWIRE 1.5J.035X260 (WIRE) IMPLANT
INQWIRE 1.5J .035X260CM (WIRE) ×1 IMPLANT
KIT ENCORE 26 ADVANTAGE (KITS) IMPLANT
PACK CARDIAC CATH (CUSTOM PROCEDURE TRAY) ×1 IMPLANT
PROTECTION STATION PRESSURIZED (MISCELLANEOUS) ×1 IMPLANT
SET ATX-X65L (MISCELLANEOUS) IMPLANT
STATION PROTECTION PRESSURIZED (MISCELLANEOUS) IMPLANT
STENT ONYX FRONTIER 3.0X15 (Permanent Stent) IMPLANT
TUBING CIL FLEX 10 FLL-RA (TUBING) IMPLANT
WIRE G HI TQ BMW 190 (WIRE) IMPLANT

## 2022-03-29 NOTE — Progress Notes (Signed)
Gave Nurse Danton Sewer an update on patients status, patient still not ready to go back upstairs.

## 2022-03-29 NOTE — Care Management Important Message (Signed)
Important Message  Patient Details  Name: KAMIN WAYE MRN: [id number] Date of Birth: May 27, 1928   Medicare Important Message Given:  Yes     Dannette Barbara 03/29/2022, 12:01 PM

## 2022-03-29 NOTE — Progress Notes (Signed)
[facility] Cardiology    SUBJECTIVE: Patient resting comfortably no chest pain no significant shortness of breath tolerated procedure well TR band still on the right wrist heparin discontinued   Vitals:   03/29/22 1428 03/29/22 1433 03/29/22 1438 03/29/22 1505  BP: (!) 148/75 133/80 139/65 (!) 141/64  Pulse: 72 72 75 66  Resp: (!) 23 (!) 24 (!) 27 (!) 24  Temp:      TempSrc:      SpO2: 94% 95% 95% 93%  Weight:      Height:         Intake/Output Summary (Last 24 hours) at 03/29/2022 1531 Last data filed at 03/29/2022 1128 Gross per 24 hour  Intake 623 ml  Output 2400 ml  Net -1777 ml      PHYSICAL EXAM  General: Well developed, well nourished, in no acute distress HEENT:  Normocephalic and atramatic Neck:  No JVD.  Lungs: Clear bilaterally to auscultation and percussion. Heart: HRRR . Normal S1 and S2 without gallops or murmurs.  Abdomen: Bowel sounds are positive, abdomen soft and non-tender  Msk:  Back normal, normal gait. Normal strength and tone for age. Extremities: No clubbing, cyanosis or edema.   Neuro: Alert and oriented X 3. Psych:  Good affect, responds appropriately   LABS: Basic Metabolic Panel: Recent Labs    03/27/22 0418 03/28/22 0433 03/29/22 0514  NA 136 136 137  K 4.8 4.7 4.4  CL 106 106 107  CO2 '24 22 22  '$ GLUCOSE 115* 103* 100*  BUN 49* 44* 43*  CREATININE 2.10* 1.87* 1.95*  CALCIUM 8.4* 8.3* 8.5*  MG 2.2  --   --    Liver Function Tests: No results for input(s): "AST", "ALT", "ALKPHOS", "BILITOT", "PROT", "ALBUMIN" in the last 72 hours. No results for input(s): "LIPASE", "AMYLASE" in the last 72 hours. CBC: Recent Labs    03/27/22 0418 03/28/22 0433  WBC 7.2 6.4  HGB 9.7* 9.4*  HCT 28.9* 28.2*  MCV 89.5 87.3  PLT 158 155   Cardiac Enzymes: No results for input(s): "CKTOTAL", "CKMB", "CKMBINDEX", "TROPONINI" in the last 72 hours. BNP: Invalid input(s): "POCBNP" D-Dimer: No results for input(s): "DDIMER" in the last 72  hours. Hemoglobin A1C: No results for input(s): "HGBA1C" in the last 72 hours. Fasting Lipid Panel: No results for input(s): "CHOL", "HDL", "LDLCALC", "TRIG", "CHOLHDL", "LDLDIRECT" in the last 72 hours. Thyroid Function Tests: No results for input(s): "TSH", "T4TOTAL", "T3FREE", "THYROIDAB" in the last 72 hours.  Invalid input(s): "FREET3" Anemia Panel: No results for input(s): "VITAMINB12", "FOLATE", "FERRITIN", "TIBC", "IRON", "RETICCTPCT" in the last 72 hours.  No results found.   Echo moderate to severely depressed left ventricular function anterior apical hypokinesis 35 to 40%  TELEMETRY: Normal sinus rhythm with 80 nonspecific ST-T wave changes:  ASSESSMENT AND PLAN:  Principal Problem:   NSTEMI (non-ST elevated myocardial infarction) ([place]) Active Problems:   Essential hypertension   Essential tremor   Chronic kidney disease, stage 3b (HCC)   Memory deficit   HFrEF (heart failure with reduced ejection fraction) (HCC)   Acute on chronic systolic CHF (congestive heart failure) (HCC)   Suprapubic catheter (HCC)   Frequent PVCs    Plan Multivessel coronary disease status post PCI and stent of RCA 48 hours ago with DES Patient status post PCI of in-stent restenosis today in the LAD with a DES Patient maintained on aspirin and Plavix indefinitely Status post non-STEMI presentation resulted in cardiac catheter identified multivessel coronary artery disease Chronic renal sufficiency creatinine  1.9-2 at baseline recommend nephrology input maintain adequate hydration Continue blood pressure management and control Heart failure therapy ACE ARB if possible or hydralazine Imdur and beta-blocker Hopefully able to discharge tomorrow Patient referred to cardiac rehab Increase activity ambulate in hall   Yolonda Kida, MD 03/29/2022 3:31 PM

## 2022-03-29 NOTE — Progress Notes (Signed)
Pt reports chest pain 3-4/10, PRN Nitro SL 0.'4mg'$  administered, supplemental oxygen 2L/. Focus assessment and vital signs done, see flowsheet. On call provider notified. Medication effective, pt denies chest pain. RN will continue to monitor.

## 2022-03-29 NOTE — Progress Notes (Signed)
[place] NOTE       Patient ID: Shawn Andrews MRN: [id number] DOB/AGE: 05-08-1928 89 y.o.  Admit date: 03/25/2022 Referring Physician Dr. Marjean Donna Primary Physician Dr. Ola Spurr Primary Cardiologist Dr. Clayborn Bigness Reason for Consultation NSTEMI  HPI: Shawn Andrews. Nicolson is a 70yoM with a PMH of CAD ([place] 12/2020 with 80% distal RCA, 50% prox RCA, 55% ost-prox Lcx, and 40% or less RPDA and LAD - medically managed), HFrEF (EF 30-35%, with rWMAs 09/2021), HTN, HLD, CKD 3, BPH, essential tremor and urinary retention with suprapubic catheter since 01/2021 who presented to [facility] ED the evening of 03/25/2022 with substernal chest discomfort persisting despite taking 2 SL nitroglycerin at home. Troponins peaked at 22K. Cardiology is consulted for assistance with his NSTEMI. LHC 03/27/2022 which revealed 75% stenosis and ulcerated plaque of the RCA, and 90% mid LAD in-stent restenosis.  S/p PCI with DES to the RCA, plan for staged intervention of LAD ISR on 03/29/2022.  Interval History:  -Reported 3-4/10 chest pain this morning that terminated with SL nitro x 1 -Feels well at my time of evaluation, currently chest pain-free without other complaints -N.p.o. for LHC this afternoon  Review of systems complete and found to be negative unless listed above     Past Medical History:  Diagnosis Date   Abnormal drug screen 06/22/2014   inapprop neg xanax rpt 3 mo (06/2014)   BPH (benign prostatic hypertrophy) 01/21/1998   has had 3 biopsies in past ([facility]) decided to stop PSA/DRE   CHF (congestive heart failure) (HCC)    Coronary artery disease    Hyperlipidemia 01/21/2002   Hypertension 05/22/2003   Left lumbar radiculopathy    Osteoarthritis 01/22/1988   knees, lumbar spondylosis and listhesis    Past Surgical History:  Procedure Laterality Date   CATARACT EXTRACTION  2013   bilateral   ESI Left 08/2014, 11/2014, 08/2015   L S1, L L5/S1 transforaminal ESI; L4/5 L5/S1  zygapophysial injections, L S1 transforaminal ([place])   FINGER SURGERY     right middle, partial traumatic amputation   IR CATHETER TUBE CHANGE  03/02/2021   KNEE ARTHROSCOPY  1990   right   KNEE SURGERY  04/2006   right partial knee replacement in [place] - rec ppx abx for any invasive procedure   LEFT HEART CATH AND CORONARY ANGIOGRAPHY N/A 01/18/2021   Procedure: LEFT HEART CATH AND CORONARY ANGIOGRAPHY;  Surgeon: Corey Skains, MD;  Location: [facility];  Service: Cardiovascular;  Laterality: N/A;    Medications Prior to Admission  Medication Sig Dispense Refill Last Dose   acetaminophen (TYLENOL 8 HOUR) 650 MG CR tablet Take 1 tablet (650 mg total) by mouth 2 (two) times a day.   03/25/2022 at am   aspirin 81 MG chewable tablet Chew 1 tablet by mouth daily.   03/25/2022   Cholecalciferol (VITAMIN D3) 25 MCG (1000 UT) CAPS Take 1 capsule (1,000 Units total) by mouth daily. 30 capsule  03/25/2022 at am   clopidogrel (PLAVIX) 75 MG tablet Take 75 mg by mouth daily.   03/25/2022   cyanocobalamin 1000 MCG tablet Take 1,000 mcg by mouth daily.   03/25/2022   docusate sodium (COLACE) 100 MG capsule Take 1 capsule (100 mg total) by mouth daily.   03/25/2022 at am   doxazosin (CARDURA) 4 MG tablet TAKE 1 TABLET BY MOUTH  DAILY (Patient taking differently: Take 4 mg by mouth daily. Take 1 tablet by mouth  daily) 90 tablet 3  03/25/2022   famotidine (PEPCID) 20 MG tablet TAKE 1 TABLET BY MOUTH AT  BEDTIME (Patient taking differently: Take 20 mg by mouth at bedtime.) 90 tablet 3 Past Week   finasteride (PROSCAR) 5 MG tablet TAKE 1 TABLET BY MOUTH  DAILY (Patient taking differently: Take 5 mg by mouth daily.) 90 tablet 3 03/25/2022   furosemide (LASIX) 20 MG tablet Take 1 tablet by mouth every other day.   Past Week   hydrALAZINE (APRESOLINE) 25 MG tablet Take 25 mg by mouth 3 (three) times daily.   03/25/2022   isosorbide mononitrate (IMDUR) 30 MG 24 hr tablet Take 30 mg by mouth daily.   03/25/2022    loratadine (CLARITIN) 10 MG tablet Take 1 tablet (10 mg total) by mouth daily as needed for allergies. 30 tablet  unknown   metoprolol tartrate (LOPRESSOR) 50 MG tablet Take 0.5 tablets (25 mg total) by mouth 2 (two) times daily.   03/25/2022   nitroGLYCERIN (NITROSTAT) 0.4 MG SL tablet Place under the tongue.   unknown   pantoprazole (PROTONIX) 40 MG tablet Take 40 mg by mouth 2 (two) times daily.   03/25/2022   polyethylene glycol powder (GLYCOLAX/MIRALAX) 17 GM/SCOOP powder Take 8.5-17 g by mouth daily as needed for moderate constipation. 3350 g 0 unknown   primidone (MYSOLINE) 50 MG tablet Take 2 tablets (100 mg total) by mouth 3 (three) times daily.   03/25/2022   rosuvastatin (CRESTOR) 20 MG tablet Take 1 tablet (20 mg total) by mouth at bedtime. 30 tablet 1 Past Week   Social History   Socioeconomic History   Marital status: Married    Spouse name: Not on file   Number of children: Not on file   Years of education: Not on file   Highest education level: Not on file  Occupational History   Occupation: retired Event organiser, Facilities manager, owns lawn care business  Tobacco Use   Smoking status: Former    Packs/day: 0.00    Years: 0.00    Total pack years: 0.00    Types: Pipe, Cigarettes    Quit date: 12/26/2000    Years since quitting: 21.2    Passive exposure: Past   Smokeless tobacco: Current    Types: Chew   Tobacco comments:    quit over 30 years ago  Vaping Use   Vaping Use: Never used  Substance and Sexual Activity   Alcohol use: Yes    Alcohol/week: 0.0 standard drinks of alcohol    Comment: Occasional beer   Drug use: No   Sexual activity: Not on file  Other Topics Concern   Not on file  Social History Narrative   Widowed, remarried 30-Jul-2007.   Daughter deceased from metastatic melanoma   L[facility] at 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 [employer]    Activity: walks 1.5 miles 3d/wk   Diet: good water, fruits/vegetables daily    Social Determinants of Health   Financial Resource Strain: Not on file  Food Insecurity: No Food Insecurity (03/26/2022)   Hunger Vital Sign    Worried About Running Out of Food in the Last Year: Never true    [place] in the Last Year: Never true  Transportation Needs: No Transportation Needs (03/26/2022)   PRAPARE - Hydrologist (Medical): No    Lack of Transportation (Non-Medical): No  Physical Activity: Not on file  Stress: Not on file  Social Connections: Not on file  Intimate  Partner Violence: Not At Risk (03/26/2022)   Humiliation, Afraid, Rape, and Kick questionnaire    Fear of Current or Ex-Partner: No    Emotionally Abused: No    Physically Abused: No    Sexually Abused: No    Family History  Problem Relation Age of Onset   Hypertension Mother    Heart disease Brother 67       MI   Diabetes Brother    Cancer Daughter        melanoma   Stroke Neg Hx       Intake/Output Summary (Last 24 hours) at 03/29/2022 1259 Last data filed at 03/29/2022 1128 Gross per 24 hour  Intake 1103 ml  Output 3150 ml  Net -2047 ml     Vitals:   03/29/22 0647 03/29/22 0915 03/29/22 1100 03/29/22 1228  BP:  118/65 (!) 108/54 104/60  Pulse:   (!) 57 (!) 57  Resp: 20 20 (!) 21 (!) 22  Temp:  98.4 F (36.9 C)  97.7 F (36.5 C)  TempSrc:  Oral  Oral  SpO2:   99% 97%  Weight:      Height:        PHYSICAL EXAM General: Pleasant elderly Caucasian male, well nourished, in no acute distress.  Sitting at incline in bed with 4 family members at bedside HEENT:  Normocephalic and atraumatic. Neck:  No JVD.  Lungs: Normal respiratory effort on oxygen by nasal cannula.  Decreased breath sounds with trace crackles, no wheezing Heart: HRRR . Normal S1 and S2 without gallops or murmurs.  Abdomen: Non-distended appearing. Msk: Normal strength and tone for age. Extremities: Warm and well perfused. No clubbing, cyanosis. Trace bilateral peripheral edema.  Right  wrist with small hematoma at arteriotomy site improved from yesterday, covered with gauze and Tegaderm, radial pulse 2+ proximal and distally Neuro: Alert and oriented X 3. Psych:  Answers questions appropriately.   Labs: Basic Metabolic Panel: Recent Labs    03/27/22 0418 03/28/22 0433 03/29/22 0514  NA 136 136 137  K 4.8 4.7 4.4  CL 106 106 107  CO2 '24 22 22  '$ GLUCOSE 115* 103* 100*  BUN 49* 44* 43*  CREATININE 2.10* 1.87* 1.95*  CALCIUM 8.4* 8.3* 8.5*  MG 2.2  --   --     Liver Function Tests: No results for input(s): "AST", "ALT", "ALKPHOS", "BILITOT", "PROT", "ALBUMIN" in the last 72 hours.  No results for input(s): "LIPASE", "AMYLASE" in the last 72 hours.  CBC: Recent Labs    03/27/22 0418 03/28/22 0433  WBC 7.2 6.4  HGB 9.7* 9.4*  HCT 28.9* 28.2*  MCV 89.5 87.3  PLT 158 155    Cardiac Enzymes: No results for input(s): "CKTOTAL", "CKMB", "CKMBINDEX", "TROPONINIHS" in the last 72 hours.  BNP: No results for input(s): "BNP" in the last 72 hours.  D-Dimer: No results for input(s): "DDIMER" in the last 72 hours. Hemoglobin A1C: No results for input(s): "HGBA1C" in the last 72 hours. Fasting Lipid Panel: No results for input(s): "CHOL", "HDL", "LDLCALC", "TRIG", "CHOLHDL", "LDLDIRECT" in the last 72 hours. Thyroid Function Tests: No results for input(s): "TSH", "T4TOTAL", "T3FREE", "THYROIDAB" in the last 72 hours.  Invalid input(s): "FREET3" Anemia Panel: No results for input(s): "VITAMINB12", "FOLATE", "FERRITIN", "TIBC", "IRON", "RETICCTPCT" in the last 72 hours.   Radiology: ECHOCARDIOGRAM COMPLETE  Result Date: 03/26/2022    ECHOCARDIOGRAM REPORT   Patient Name:   Shawn Andrews Date of Exam: 03/26/2022 Medical Rec #:  [id number]  Height:       70.0 in Accession #:    [id number]    Weight:       187.2 lb Date of Birth:  July 21, 1928     BSA:          2.029 m Patient Age:    89 years      BP:           108/54 mmHg Patient Gender: M             HR:            66 bpm. Exam Location:  [facility] Procedure: 2D Echo, Cardiac Doppler and Color Doppler Indications:     Chest Pain  History:         Patient has prior history of Echocardiogram examinations, most                  recent 10/04/2021. CHF, CAD and Acute MI, Arrythmias:PVC,                  Signs/Symptoms:Chest Pain; Risk Factors:Hypertension and                  Dyslipidemia.  Sonographer:     Wenda Low Referring Phys:  [id number] [place] Katniss Weedman Diagnosing Phys: Yolonda Kida MD IMPRESSIONS  1. Ant/Apical hypo.  2. Left ventricular ejection fraction, by estimation, is 30 to 35%. The left ventricle has moderately decreased function. The left ventricle demonstrates regional wall motion abnormalities (see scoring diagram/findings for description). The left ventricular internal cavity size was moderately dilated. There is mild concentric left ventricular hypertrophy. Left ventricular diastolic parameters are consistent with Grade I diastolic dysfunction (impaired relaxation).  3. Right ventricular systolic function is low normal. The right ventricular size is mildly enlarged. Mildly increased right ventricular wall thickness. There is severely elevated pulmonary artery systolic pressure.  4. Left atrial size was moderately dilated.  5. Right atrial size was mildly dilated.  6. The mitral valve is normal in structure. Mild mitral valve regurgitation.  7. Tricuspid valve regurgitation is mild to moderate.  8. The aortic valve is grossly normal. Aortic valve regurgitation is not visualized. FINDINGS  Left Ventricle: Left ventricular ejection fraction, by estimation, is 30 to 35%. The left ventricle has moderately decreased function. The left ventricle demonstrates regional wall motion abnormalities. The left ventricular internal cavity size was moderately dilated. There is mild concentric left ventricular hypertrophy. Left ventricular diastolic parameters are consistent with Grade I diastolic dysfunction (impaired  relaxation). Right Ventricle: The right ventricular size is mildly enlarged. Mildly increased right ventricular wall thickness. Right ventricular systolic function is low normal. There is severely elevated pulmonary artery systolic pressure. The tricuspid regurgitant  velocity is 3.68 m/s, and with an assumed right atrial pressure of 8 mmHg, the estimated right ventricular systolic pressure is 0000000 mmHg. Left Atrium: Left atrial size was moderately dilated. Right Atrium: Right atrial size was mildly dilated. Pericardium: There is no evidence of pericardial effusion. Mitral Valve: The mitral valve is normal in structure. Mild mitral valve regurgitation. MV peak gradient, 3.4 mmHg. The mean mitral valve gradient is 1.0 mmHg. Tricuspid Valve: The tricuspid valve is normal in structure. Tricuspid valve regurgitation is mild to moderate. Aortic Valve: The aortic valve is grossly normal. Aortic valve regurgitation is not visualized. Aortic valve mean gradient measures 3.5 mmHg. Aortic valve peak gradient measures 7.2 mmHg. Aortic valve area, by VTI measures 3.00 cm. Pulmonic Valve: The pulmonic valve was grossly normal. Pulmonic  valve regurgitation is not visualized. Aorta: The ascending aorta was not well visualized. IAS/Shunts: No atrial level shunt detected by color flow Doppler. Additional Comments: Ant/Apical hypo.  LEFT VENTRICLE PLAX 2D LVIDd:         5.30 cm   Diastology LVIDs:         4.40 cm   LV e' medial:    5.22 cm/s LV PW:         1.30 cm   LV E/e' medial:  12.5 LV IVS:        1.20 cm   LV e' lateral:   10.70 cm/s LVOT diam:     2.10 cm   LV E/e' lateral: 6.1 LV SV:         96 LV SV Index:   47 LVOT Area:     3.46 cm  RIGHT VENTRICLE RV Basal diam:  4.35 cm RV Mid diam:    3.70 cm RV S prime:     16.90 cm/s LEFT ATRIUM              Index        RIGHT ATRIUM           Index LA diam:        5.10 cm  2.51 cm/m   RA Area:     21.30 cm LA Vol (A2C):   97.6 ml  48.09 ml/m  RA Volume:   65.90 ml  32.47 ml/m  LA Vol (A4C):   102.0 ml 50.26 ml/m LA Biplane Vol: 107.0 ml 52.73 ml/m  AORTIC VALVE                    PULMONIC VALVE AV Area (Vmax):    3.10 cm     PV Vmax:       1.08 m/s AV Area (Vmean):   2.85 cm     PV Peak grad:  4.7 mmHg AV Area (VTI):     3.00 cm AV Vmax:           134.00 cm/s AV Vmean:          83.950 cm/s AV VTI:            0.321 m AV Peak Grad:      7.2 mmHg AV Mean Grad:      3.5 mmHg LVOT Vmax:         120.00 cm/s LVOT Vmean:        69.100 cm/s LVOT VTI:          0.278 m LVOT/AV VTI ratio: 0.87  AORTA Ao Root diam: 3.30 cm MITRAL VALVE               TRICUSPID VALVE MV Area (PHT): 4.08 cm    TR Peak grad:   54.2 mmHg MV Area VTI:   3.06 cm    TR Vmax:        368.00 cm/s MV Peak grad:  3.4 mmHg MV Mean grad:  1.0 mmHg    SHUNTS MV Vmax:       0.93 m/s    Systemic VTI:  0.28 m MV Vmean:      52.2 cm/s   Systemic Diam: 2.10 cm MV Decel Time: 186 msec MV E velocity: 65.00 cm/s MV A velocity: 89.60 cm/s MV E/A ratio:  0.73 Dwayne D Callwood MD Electronically signed by Yolonda Kida MD Signature Date/Time: 03/26/2022/2:00:05 PM    Final    CT Angio Chest/Abd/Pel for Dissection W  and/or Wo Contrast  Result Date: 03/25/2022 CLINICAL DATA:  Midsternal chest pain EXAM: CT ANGIOGRAPHY CHEST, ABDOMEN AND PELVIS TECHNIQUE: Non-contrast CT of the chest was initially obtained. Multidetector CT imaging through the chest, abdomen and pelvis was performed using the standard protocol during bolus administration of intravenous contrast. Multiplanar reconstructed images and MIPs were obtained and reviewed to evaluate the vascular anatomy. RADIATION DOSE REDUCTION: This exam was performed according to the departmental dose-optimization program which includes automated exposure control, adjustment of the mA and/or kV according to patient size and/or use of iterative reconstruction technique. CONTRAST:  46m OMNIPAQUE IOHEXOL 350 MG/ML SOLN COMPARISON:  CT abdomen/pelvis dated 02/25/2021. CT chest dated  01/09/2021. FINDINGS: CTA CHEST FINDINGS Cardiovascular: On unenhanced CT, there is no evidence of intramural hematoma. Following contrast administration, there is no evidence of thoracic aortic aneurysm or dissection. Atherosclerotic calcifications of the arch. Although not tailored for evaluation of the pulmonary arteries, there is no evidence of pulmonary embolism to the lobar level. Mild cardiomegaly.  No pericardial effusion. Severe three-vessel coronary atherosclerosis. Mediastinum/Nodes: No suspicious mediastinal lymphadenopathy. Visualized thyroid is unremarkable. Lungs/Pleura: Moderate left and small right pleural effusions. Additional faint ground-glass opacities in the lungs bilaterally, with mild interlobular septal thickening in the upper lobes, favoring mild interstitial edema. Mild dependent atelectasis in the posterior upper and lower lobes. No focal consolidation. No pneumothorax. Musculoskeletal: Degenerative changes of the mid/lower thoracic spine. Review of the MIP images confirms the above findings. CTA ABDOMEN AND PELVIS FINDINGS VASCULAR Aorta: No evidence of abdominal aortic aneurysm or dissection. Patent. Atherosclerotic calcifications. Celiac: Patent.  Atherosclerotic calcifications. SMA: Patent.  Atherosclerotic calcifications at the origin. Renals: Patent. Atherosclerotic calcifications at the origin bilaterally. IMA: Patent. Inflow: Patent bilaterally.  Atherosclerotic calcifications. Veins: Grossly unremarkable. Review of the MIP images confirms the above findings. NON-VASCULAR Hepatobiliary: Liver is within normal limits. Gallbladder is unremarkable. No intrahepatic or extrahepatic ductal dilatation. Pancreas: Within normal limits. Spleen: Within normal limits. Adrenals/Urinary Tract: Adrenal glands are within normal limits. Bilateral renal cysts, measuring up to 3.2 cm in the left upper kidney (series 5/image 152), benign (Bosniak I). No follow-up is recommended. No hydronephrosis.  Bladder is not decompressed with an indwelling suprapubic Foley catheter. Stomach/Bowel: Stomach is within normal limits. No evidence of bowel obstruction. Normal appendix (series 5/image 226). Extensive scattered colonic diverticulosis, without evidence of diverticulitis. Lymphatic: No suspicious abdominopelvic lymphadenopathy. Reproductive: Prostatomegaly, with enlargement of the central gland which indents the base of the bladder, suggesting BPH. Other: Trace pelvic ascites (series 5/image 273). Musculoskeletal: Grade 1 spondylolisthesis at L5-S1, chronic. Mild degenerative changes of the lumbar spine. Review of the MIP images confirms the above findings. IMPRESSION: No evidence of thoracoabdominal aortic aneurysm or dissection. No evidence of pulmonary embolism. Cardiomegaly with mild interstitial edema. Moderate left and small right pleural effusions. Additional ancillary findings as above. Electronically Signed   By: SJulian HyM.D.   On: 03/25/2022 22:43   DG Chest Port 1 View  Result Date: 03/25/2022 CLINICAL DATA:  Chest pain. EXAM: PORTABLE CHEST 1 VIEW COMPARISON:  Chest radiograph dated 10/03/2021. FINDINGS: There is mild cardiomegaly. Mild vascular congestion and edema. No focal consolidation, pleural effusion, or pneumothorax. Atherosclerotic calcification of the aorta. No acute osseous pathology. IMPRESSION: Cardiomegaly with mild vascular congestion and edema. Electronically Signed   By: AAnner CreteM.D.   On: 03/25/2022 20:41    ECHO 09/2021  1. Left ventricular ejection fraction, by estimation, is 30 to 35%. The  left ventricle has moderately decreased function. The left  ventricle  demonstrates regional wall motion abnormalities (see scoring  diagram/findings for description). Left ventricular   diastolic parameters were normal.   2. Right ventricular systolic function is normal. The right ventricular  size is normal.   3. The mitral valve is normal in structure. Mild mitral  valve  regurgitation. No evidence of mitral stenosis.   4. The aortic valve is normal in structure. Aortic valve regurgitation is  mild. No aortic stenosis is present.   5. The inferior vena cava is normal in size with greater than 50%  respiratory variability, suggesting right atrial pressure of 3 mmHg.   Leane Call 01/08/22 Impression   Abnormal myocardial perfusion scan left ventricular enlargement globally depressed ejection fraction of 35% there is a moderate to large area of anterior apical persistent defect consistent with scar there is also RV uptake consistent with systolic cardiomyopathy.  No clear evidence of reversible ischemia.  Conclusion this is a high risk study  TELEMETRY reviewed by me (LT) 03/29/2022 : Sinus bradycardia to sinus rhythm rate high 50s-60s  EKG reviewed by me: NSR lateral ST depressions  Data reviewed by me (LT) 03/29/2022: Hospitalist progress note, nursing notes last 24h vitals tele labs imaging I/O    Principal Problem:   NSTEMI (non-ST elevated myocardial infarction) [facility]) Active Problems:   Essential hypertension   Essential tremor   Chronic kidney disease, stage 3b (HCC)   Memory deficit   HFrEF (heart failure with reduced ejection fraction) (HCC)   Acute on chronic systolic CHF (congestive heart failure) ([place])   Suprapubic catheter ([place])   Frequent PVCs    ASSESSMENT AND PLAN:  Joylene Draft F. Padula is a 75yoM with a PMH of CAD ([place] 12/2020 with 80% distal RCA, 50% prox RCA, 55% ost-prox Lcx, and 40% or less RPDA and LAD - medically managed), HFrEF (EF 30-35%, with rWMAs 09/2021), HTN, HLD, CKD 3, BPH, essential tremor and urinary retention with suprapubic catheter since 01/2021 who presented to [facility] ED the evening of 03/25/2022 with substernal chest discomfort persisting despite taking 2 SL nitroglycerin at home. Troponins peaked at 22K. Cardiology is consulted for assistance with his NSTEMI.  # NSTEMI  # CAD s/p PCI with DES to RCA 03/27/2022,  90% ISR of mid LAD stent Presented with severe substernal chest discomfort after eating a baked potato for dinner on 3/4, eventual relief with nitro and heparin infusion started in the emergency department.  EKG with lateral ST depressions, troponin with current peak 22,400, concerning for non-STEMI.  Chest pain-free at my time of evaluation while on a nitro gtt. -S/p 325 mg aspirin, 81 mg aspirin and clopidogrel 75 mg daily -Continue heparin drip until LHC -Continue metoprolol tartrate 12.5 mg twice daily -s/p nitroglycerin infusion -Consider Imdur 90 mg daily  -Consider addition of ranexa  -echo complete resulted similarly to prior 6 months ago -Plan for staged intervention of mid LAD ISR this afternoon with Dr. Lujean Amel.  The patient and his wife are agreeable and eager to proceed.   # acute on chronic HFrEF (30-35%, rWMAs) Not clinically hypervolemic on exam, bnp slightly up at 600. S/p IV lasix '40mg'$  x1 and '20mg'$  x 1.  -hold further diuresis for now -Continue KVO fluids starting this morning pre and post procedurally - continue metoprolol 12.'5mg'$  BID,  - consider ARB pending stability of renal function tomorrow following contrast dye load today, possible addition of MRA/SGLT2i as renal function and BP allow.   # CKD 3 BUN/Cr on admission 43/1.93 and GFR 32,  fortunately recovered after IV fluids  This patient's plan of care was discussed and created with Dr. Clayborn Bigness and he is in agreement.  Signed: Tristan Schroeder , PA-C 03/29/2022, 12:59 PM [facility]

## 2022-03-29 NOTE — CV Procedure (Signed)
Brief PCI stent to LAD note  Patient brought to the cardiac Cath Lab for staged intervention of mid LAD in-stent restenosis Patient was maintained on Plavix given an extra 300 p.o. XB LAD 6 [place] guide radiologist through a 6 [place] radial sheath Predilated with a 2.5 x 15 mm trek balloon Deployment of DES 3.0 x 15 mm frontier Onyx to 16 atm  Lesion reduced from 95 down to 0% TIMI-3 flow was maintained throughout the case  Patient tolerated procedure well Maintained on aspirin Plavix Hopefully can be discharged tomorrow  TR band right wrist

## 2022-03-29 NOTE — Progress Notes (Signed)
[place] at [place] NAME: Shawn Andrews    MR#:  [id number]  DATE OF BIRTH:  1929-01-11  SUBJECTIVE:  Wife at bedside Had some CP last nite relieved with nitro SL    VITALS:  Blood pressure 118/65, pulse 76, temperature 98.4 F (36.9 C), temperature source Oral, resp. rate 20, height '5\' 10"'$  (1.778 m), weight 81.5 kg, SpO2 92 %.  PHYSICAL EXAMINATION:   GENERAL:  89 y.o.-year-old patient with no acute distress.  LUNGS: Normal breath sounds bilaterally, no wheezing CARDIOVASCULAR: S1, S2 normal. No murmur   ABDOMEN: Soft, nontender, nondistended. Bowel sounds present.  EXTREMITIES: No  edema b/l.    NEUROLOGIC: nonfocal  patient is alert and awake SKIN: No obvious rash, lesion, or ulcer. Brusies + UE  LABORATORY PANEL:  CBC Recent Labs  Lab 03/28/22 0433  WBC 6.4  HGB 9.4*  HCT 28.2*  PLT 155     Chemistries  Recent Labs  Lab 03/25/22 2019 03/26/22 1211 03/27/22 0418 03/28/22 0433 03/29/22 0514  NA 133*   < > 136   < > 137  K 4.6   < > 4.8   < > 4.4  CL 104   < > 106   < > 107  CO2 20*   < > 24   < > 22  GLUCOSE 177*   < > 115*   < > 100*  BUN 43*   < > 49*   < > 43*  CREATININE 1.93*   < > 2.10*   < > 1.95*  CALCIUM 8.9   < > 8.4*   < > 8.5*  MG  --   --  2.2  --   --   AST 18  --   --   --   --   ALT 12  --   --   --   --   ALKPHOS 68  --   --   --   --   BILITOT 0.4  --   --   --   --    < > = values in this interval not displayed.    Cardiac Enzymes No results for input(s): "TROPONINI" in the last 168 hours. RADIOLOGY:  No results found.  Assessment and Plan   Shawn Andrews is a 14yoM with a PMH of CAD ([place] 12/2020 with 80% distal RCA, 50% prox RCA, 55% ost-prox Lcx, and 40% or less RPDA and LAD - medically managed), HFrEF (EF 30-35%, with rWMAs 09/2021), HTN, HLD, CKD 3, BPH, essential tremor and urinary retention with suprapubic catheter since 01/2021 who presented to [facility] ED the evening of 03/25/2022  with substernal chest discomfort persisting despite taking 2 SL nitroglycerin at home. Troponins peaked at 22K   Acute NSTEMI in a patient with underlying CAD: -- Troponins went from 32 to 22,486.  --Continue IV heparin drip and monitor heparin level per protocol--now d/ced post-cath --Continue aspirin, Plavix and rosuvastatin.   --s/p cath by  DR callwood   Coronaries Left main large moderate calcification LAD large proximal 75% lesion with moderate calcification mid LAD with a 90% lesion instent restenosis otherwise widely patent stents extensive prior stents TIMI-3 flow Circumflex is medium with minor irregularities RCA very large proximal ulcerated plaque of around 75% with TIMI-3 flow Intervention PCI and stent to proximal RCA Placement of 3.5 x 15 mm DES to 15 atm Lesion reduced from 75 down to 0 --Pt will need staged mid  LAD and instent restenosis likley tomorrow per Cardiology PA --Creat down to 1.8 --plan for stage mid LAD intervention today per cardiology. Pt NPO  Acute exacerbation of chronic systolic CHF  --received IV Lasix x1  --Euvolemic. Hold further diuresis   Acute hypoxic respiratory failure: Continue 3 L/min oxygen via [place].  Wean off oxygen as able. --sats 99% on 2liter   BPH with chronic urinary retention s/p suprapubic catheter: - onProscar and Cardura    CKD stage IIIb  --creat on admission 1.93--2.13--2.1--1.87--1.95 --baseline creat 1.5--1.67  Procedures Cardiac cath 03/27/22 Family communication :wife at bedside Consults :Cardiology CODE STATUS: FULL DVT Prophylaxis :enoxaparin Level of care: Progressive Status is: Inpatient Remains inpatient appropriate because: NSTEMI    TOTAL TIME TAKING CARE OF THIS PATIENT: 35 minutes.  >50% time spent on counselling and coordination of care  Note: This dictation was prepared with Dragon dictation along with smaller phrase technology. Any transcriptional errors that result from this process are  unintentional.  Fritzi Mandes M.D    [facility]   CC: Primary care physician; Leonel Ramsay, MD

## 2022-03-30 DIAGNOSIS — I5023 Acute on chronic systolic (congestive) heart failure: Secondary | ICD-10-CM | POA: Diagnosis not present

## 2022-03-30 DIAGNOSIS — G250 Essential tremor: Secondary | ICD-10-CM | POA: Diagnosis not present

## 2022-03-30 DIAGNOSIS — N1832 Chronic kidney disease, stage 3b: Secondary | ICD-10-CM | POA: Diagnosis not present

## 2022-03-30 DIAGNOSIS — I214 Non-ST elevation (NSTEMI) myocardial infarction: Secondary | ICD-10-CM | POA: Diagnosis not present

## 2022-03-30 LAB — CBC
HCT: 29.2 % — ABNORMAL LOW (ref 39.0–52.0)
Hemoglobin: 9.8 g/dL — ABNORMAL LOW (ref 13.0–17.0)
MCH: 29.4 pg (ref 26.0–34.0)
MCHC: 33.6 g/dL (ref 30.0–36.0)
MCV: 87.7 fL (ref 80.0–100.0)
Platelets: 166 10*3/uL (ref 150–400)
RBC: 3.33 MIL/uL — ABNORMAL LOW (ref 4.22–5.81)
RDW: 13.4 % (ref 11.5–15.5)
WBC: 7 10*3/uL (ref 4.0–10.5)
nRBC: 0 % (ref 0.0–0.2)

## 2022-03-30 LAB — BASIC METABOLIC PANEL
Anion gap: 8 (ref 5–15)
BUN: 36 mg/dL — ABNORMAL HIGH (ref 8–23)
CO2: 20 mmol/L — ABNORMAL LOW (ref 22–32)
Calcium: 8.4 mg/dL — ABNORMAL LOW (ref 8.9–10.3)
Chloride: 110 mmol/L (ref 98–111)
Creatinine, Ser: 1.75 mg/dL — ABNORMAL HIGH (ref 0.61–1.24)
GFR, Estimated: 36 mL/min — ABNORMAL LOW (ref >60)
Glucose, Bld: 106 mg/dL — ABNORMAL HIGH (ref 70–99)
Potassium: 4.4 mmol/L (ref 3.5–5.1)
Sodium: 138 mmol/L (ref 135–145)

## 2022-03-30 MED ORDER — MELATONIN 5 MG PO TABS
5.0000 mg | ORAL_TABLET | Freq: Every day | ORAL | Status: DC
  Administered 2022-03-30: 5 mg via ORAL
  Filled 2022-03-30: qty 1

## 2022-03-30 MED ORDER — HYDRALAZINE HCL 50 MG PO TABS
50.0000 mg | ORAL_TABLET | Freq: Three times a day (TID) | ORAL | Status: DC
  Administered 2022-03-30 – 2022-03-31 (×3): 50 mg via ORAL
  Filled 2022-03-30 (×3): qty 1

## 2022-03-30 MED ORDER — ISOSORBIDE MONONITRATE ER 60 MG PO TB24
60.0000 mg | ORAL_TABLET | Freq: Two times a day (BID) | ORAL | Status: DC
  Administered 2022-03-30 – 2022-03-31 (×2): 60 mg via ORAL
  Filled 2022-03-30 (×2): qty 1

## 2022-03-30 NOTE — Progress Notes (Signed)
[facility] Cardiology    SUBJECTIVE: Patient states he feels reasonably well has any significant chest pain no shortness of breath right wrist is healing adequately has not ambulated yet.  Patient feels much improved   Vitals:   03/30/22 0229 03/30/22 0233 03/30/22 0541 03/30/22 0913  BP:  (!) 160/73  (!) 143/80  Pulse: 81 82    Resp: (!) 25 (!) 24    Temp:   (!) 97.5 F (36.4 C) 98.4 F (36.9 C)  TempSrc:   Oral Oral  SpO2: 94% 92%    Weight:   82.2 kg   Height:         Intake/Output Summary (Last 24 hours) at 03/30/2022 0935 Last data filed at 03/30/2022 0600 Gross per 24 hour  Intake 1105.38 ml  Output 2000 ml  Net -894.62 ml      PHYSICAL EXAM  General: Well developed, well nourished, in no acute distress HEENT:  Normocephalic and atramatic Neck:  No JVD.  Lungs: Clear bilaterally to auscultation and percussion. Heart: HRRR . Normal S1 and S2 without gallops or murmurs.  Abdomen: Bowel sounds are positive, abdomen soft and non-tender  Msk:  Back normal, normal gait. Normal strength and tone for age. Extremities: No clubbing, cyanosis or edema.   Neuro: Alert and oriented X 3. Psych:  Good affect, responds appropriately   LABS: Basic Metabolic Panel: Recent Labs    03/29/22 0514 03/30/22 0608  NA 137 138  K 4.4 4.4  CL 107 110  CO2 22 20*  GLUCOSE 100* 106*  BUN 43* 36*  CREATININE 1.95* 1.75*  CALCIUM 8.5* 8.4*   Liver Function Tests: No results for input(s): "AST", "ALT", "ALKPHOS", "BILITOT", "PROT", "ALBUMIN" in the last 72 hours. No results for input(s): "LIPASE", "AMYLASE" in the last 72 hours. CBC: Recent Labs    03/28/22 0433 03/30/22 0608  WBC 6.4 7.0  HGB 9.4* 9.8*  HCT 28.2* 29.2*  MCV 87.3 87.7  PLT 155 166   Cardiac Enzymes: No results for input(s): "CKTOTAL", "CKMB", "CKMBINDEX", "TROPONINI" in the last 72 hours. BNP: Invalid input(s): "POCBNP" D-Dimer: No results for input(s): "DDIMER" in the last 72 hours. Hemoglobin A1C: No  results for input(s): "HGBA1C" in the last 72 hours. Fasting Lipid Panel: No results for input(s): "CHOL", "HDL", "LDLCALC", "TRIG", "CHOLHDL", "LDLDIRECT" in the last 72 hours. Thyroid Function Tests: No results for input(s): "TSH", "T4TOTAL", "T3FREE", "THYROIDAB" in the last 72 hours.  Invalid input(s): "FREET3" Anemia Panel: No results for input(s): "VITAMINB12", "FOLATE", "FERRITIN", "TIBC", "IRON", "RETICCTPCT" in the last 72 hours.  CARDIAC CATHETERIZATION  Result Date: 03/29/2022   Mid LAD lesion is 95% stenosed.   Dist LAD lesion is 50% stenosed.   Dist LM to Ost LAD lesion is 60% stenosed.   Non-stenotic Prox RCA lesion was previously treated.   A stent was successfully placed.   Post intervention, there is a 0% residual stenosis. Conclusion Successful PCI and stent to in-stent restenosis of mid LAD placement of a 3.0 x 15 mm frontier Onyx to 16 atm Lesion reduced from 95 down to 0% TIMI-3 flow was maintained throughout Patient to be maintained on aspirin and Plavix indefinitely Hopefully patient is stable for transfer within 23 hours     Echo mildly depressed left ventricular function EF around 30-35% anterior apical hypokinesis left ventricular lodgment  TELEMETRY: Normal sinus rhythm first-degree AV block interventricular conduction delay nonspecific ST-T wave changes:  ASSESSMENT AND PLAN:  Principal Problem:   NSTEMI (non-ST elevated myocardial infarction) ([place])  Active Problems:   Essential hypertension   Essential tremor   Chronic kidney disease, stage 3b (HCC)   Memory deficit   HFrEF (heart failure with reduced ejection fraction) (HCC)   Acute on chronic systolic CHF (congestive heart failure) ([place])   Suprapubic catheter ([place])   Frequent PVCs    Plan Status post non-STEMI with multivessel coronary disease status post staged PCI first of RCA and then LAD in-stent restenosis Continue aspirin Plavix Hypertension continue hydralazine Imdur metoprolol GERD continue  Protonix famotidine therapy for reflux type symptoms Ischemic cardiomyopathy recommend hydralazine Imdur beta-blocker we have avoided ACE ARB spironolactone or Arni because of renal insufficiency advanced Imdur hydralazine and beta-blocker Continue Crestor therapy for hyperlipidemia Obesity recommend modest weight loss exercise portion control Prior to patient increase activity in the halls ambulate Right wrist small hematoma good pulses good function continue to limit use of the arm for the next 48 to 72 hours From a cardiac standpoint the patient should be cleared for discharge by tomorrow   Yolonda Kida, MD 03/30/2022 9:35 AM

## 2022-03-30 NOTE — Plan of Care (Signed)
Patient AOX4, VSS throughout shift. All meds given on time as ordered.  Pt c/o 4/10 chest tightness.  VSS, Nitroglycerin PRN given once and chest pain resolved within 5 minutes.  12 lead EKG done.  Provider notified.  Foley care provided.  CHG bath given.  Right arm remained elevated on 3 pillows throughout shift, no bleeding noted from right radial.  Wife remains at bedside.  POC maintained, will continue to monitor.  Problem: Education: Goal: Understanding of cardiac disease, CV risk reduction, and recovery process will improve Outcome: Progressing Goal: Individualized Educational Video(s) Outcome: Progressing   Problem: Activity: Goal: Ability to tolerate increased activity will improve Outcome: Progressing   Problem: Cardiac: Goal: Ability to achieve and maintain adequate cardiovascular perfusion will improve Outcome: Progressing   Problem: Health Behavior/Discharge Planning: Goal: Ability to safely manage health-related needs after discharge will improve Outcome: Progressing   Problem: Education: Goal: Knowledge of General Education information will improve Description: Including pain rating scale, medication(s)/side effects and non-pharmacologic comfort measures Outcome: Progressing   Problem: Health Behavior/Discharge Planning: Goal: Ability to manage health-related needs will improve Outcome: Progressing   Problem: Clinical Measurements: Goal: Ability to maintain clinical measurements within normal limits will improve Outcome: Progressing Goal: Will remain free from infection Outcome: Progressing Goal: Diagnostic test results will improve Outcome: Progressing Goal: Respiratory complications will improve Outcome: Progressing Goal: Cardiovascular complication will be avoided Outcome: Progressing   Problem: Activity: Goal: Risk for activity intolerance will decrease Outcome: Progressing   Problem: Nutrition: Goal: Adequate nutrition will be maintained Outcome:  Progressing   Problem: Coping: Goal: Level of anxiety will decrease Outcome: Progressing   Problem: Elimination: Goal: Will not experience complications related to bowel motility Outcome: Progressing Goal: Will not experience complications related to urinary retention Outcome: Progressing   Problem: Pain Managment: Goal: General experience of comfort will improve Outcome: Progressing   Problem: Safety: Goal: Ability to remain free from injury will improve Outcome: Progressing   Problem: Skin Integrity: Goal: Risk for impaired skin integrity will decrease Outcome: Progressing   Problem: Education: Goal: Understanding of CV disease, CV risk reduction, and recovery process will improve Outcome: Progressing Goal: Individualized Educational Video(s) Outcome: Progressing   Problem: Activity: Goal: Ability to return to baseline activity level will improve Outcome: Progressing   Problem: Cardiovascular: Goal: Ability to achieve and maintain adequate cardiovascular perfusion will improve Outcome: Progressing Goal: Vascular access site(s) Level 0-1 will be maintained Outcome: Progressing   Problem: Health Behavior/Discharge Planning: Goal: Ability to safely manage health-related needs after discharge will improve Outcome: Progressing

## 2022-03-30 NOTE — Progress Notes (Signed)
[place] at [place] NAME: Shawn Andrews    MR#:  [id number]  DATE OF BIRTH:  10/07/1928  SUBJECTIVE:  Wife at bedside Had some CP last nite relieved with nitro SL OOB to chair today    VITALS:  Blood pressure (!) 143/80, pulse 82, temperature 98.4 F (36.9 C), temperature source Oral, resp. rate (!) 24, height '5\' 10"'$  (1.778 m), weight 82.2 kg, SpO2 92 %.  PHYSICAL EXAMINATION:   GENERAL:  89 y.o.-year-old patient with no acute distress.  LUNGS: Normal breath sounds bilaterally, no wheezing CARDIOVASCULAR: S1, S2 normal. No murmur   ABDOMEN: Soft, nontender, nondistended. Bowel sounds present.  EXTREMITIES: No  edema b/l.    NEUROLOGIC: nonfocal  patient is alert and awake SKIN: No obvious rash, lesion, or ulcer. Brusies + UE  LABORATORY PANEL:  CBC Recent Labs  Lab 03/30/22 0608  WBC 7.0  HGB 9.8*  HCT 29.2*  PLT 166     Chemistries  Recent Labs  Lab 03/25/22 2019 03/26/22 1211 03/27/22 0418 03/28/22 0433 03/30/22 0608  NA 133*   < > 136   < > 138  K 4.6   < > 4.8   < > 4.4  CL 104   < > 106   < > 110  CO2 20*   < > 24   < > 20*  GLUCOSE 177*   < > 115*   < > 106*  BUN 43*   < > 49*   < > 36*  CREATININE 1.93*   < > 2.10*   < > 1.75*  CALCIUM 8.9   < > 8.4*   < > 8.4*  MG  --   --  2.2  --   --   AST 18  --   --   --   --   ALT 12  --   --   --   --   ALKPHOS 68  --   --   --   --   BILITOT 0.4  --   --   --   --    < > = values in this interval not displayed.    Cardiac Enzymes No results for input(s): "TROPONINI" in the last 168 hours. RADIOLOGY:  CARDIAC CATHETERIZATION  Result Date: 03/29/2022   Mid LAD lesion is 95% stenosed.   Dist LAD lesion is 50% stenosed.   Dist LM to Ost LAD lesion is 60% stenosed.   Non-stenotic Prox RCA lesion was previously treated.   A stent was successfully placed.   Post intervention, there is a 0% residual stenosis. Conclusion Successful PCI and stent to in-stent restenosis  of mid LAD placement of a 3.0 x 15 mm frontier Onyx to 16 atm Lesion reduced from 95 down to 0% TIMI-3 flow was maintained throughout Patient to be maintained on aspirin and Plavix indefinitely Hopefully patient is stable for transfer within 23 hours    Assessment and Shawn Andrews. Shawn Andrews is a 9yoM with a PMH of CAD ([place] 12/2020 with 80% distal RCA, 50% prox RCA, 55% ost-prox Lcx, and 40% or less RPDA and LAD - medically managed), HFrEF (EF 30-35%, with rWMAs 09/2021), HTN, HLD, CKD 3, BPH, essential tremor and urinary retention with suprapubic catheter since 01/2021 who presented to [facility] ED the evening of 03/25/2022 with substernal chest discomfort persisting despite taking 2 SL nitroglycerin at home. Troponins peaked at 22K   Acute NSTEMI in a  patient with underlying CAD: -- Troponins went from 32 to 22,486.  --Continue IV heparin drip and monitor heparin level per protocol--now d/ced post-cath --Continue aspirin, Plavix and rosuvastatin.   --3/6 s/p cath by  Shawn Andrews   Coronaries Left main large moderate calcification LAD large proximal 75% lesion with moderate calcification mid LAD with a 90% lesion instent restenosis otherwise widely patent stents extensive prior stents TIMI-3 flow RCA very large proximal ulcerated plaque of around 75% with TIMI-3 flow Intervention PCI and stent to proximal RCA Placement of 3.5 x 15 mm DES to 15 atm Lesion reduced from 75 down to 0 --Pt will need staged mid LAD and instent restenosis likley tomorrow per Cardiology PA --Creat down to 1.8 --3/8--s/p stage LHC with mid LAD intervention with stent placement per cardiology.  Acute exacerbation of chronic systolic CHF  --received IV Lasix x1  --Euvolemic. Hold further diuresis   Acute hypoxic respiratory failure: Continue 3 L/min oxygen via [place].  Wean off oxygen as able. --sats 99% on 2liter   BPH with chronic urinary retention s/p suprapubic catheter: - onProscar and Cardura    CKD stage IIIb   --creat on admission 1.93--2.13--2.1--1.87--1.95--1.7 --baseline creat 1.5--1.67  Procedures Cardiac cath 03/27/22 Family communication :wife at bedside Consults :Cardiology CODE STATUS: FULL DVT Prophylaxis :enoxaparin Level of care: Progressive Status is: Inpatient Remains inpatient appropriate because: NSTEMI   Cont to monitor 1 more day--d/c in am if stable  TOTAL TIME TAKING CARE OF THIS PATIENT: 35 minutes.  >50% time spent on counselling and coordination of care  Note: This dictation was prepared with Dragon dictation along with smaller phrase technology. Any transcriptional errors that result from this process are unintentional.  Shawn Andrews M.D    [facility]   CC: Primary care physician; Shawn Ramsay, MD

## 2022-03-31 DIAGNOSIS — I214 Non-ST elevation (NSTEMI) myocardial infarction: Secondary | ICD-10-CM | POA: Diagnosis not present

## 2022-03-31 MED ORDER — NITROGLYCERIN 0.4 MG SL SUBL: 0.4000 mg | SUBLINGUAL_TABLET | SUBLINGUAL | 12 refills | Status: DC | PRN

## 2022-03-31 MED ORDER — ROSUVASTATIN CALCIUM 40 MG PO TABS: 40.0000 mg | ORAL_TABLET | Freq: Every day | ORAL | 2 refills | Status: DC

## 2022-03-31 MED ORDER — ISOSORBIDE MONONITRATE ER 60 MG PO TB24: 60.0000 mg | ORAL_TABLET | Freq: Two times a day (BID) | ORAL | 1 refills | Status: DC

## 2022-03-31 MED ORDER — HYDRALAZINE HCL 50 MG PO TABS: 50.0000 mg | ORAL_TABLET | Freq: Three times a day (TID) | ORAL | 0 refills | Status: DC

## 2022-03-31 NOTE — Progress Notes (Signed)
Mobility Specialist - Progress Note   03/31/22 0909  Mobility  Activity Ambulated independently in hallway;Stood at bedside;Dangled on edge of bed  Level of Assistance Independent  Assistive Device None  Distance Ambulated (ft) 220 ft  Activity Response Tolerated well  Mobility Referral Yes  $Mobility charge 1 Mobility   Pt supine in bed on RA upon arrival. Pt STS and ambulates in hallway indep with no LOB noted. Pt returns to bed with needs in reach.   Gretchen Short  Mobility Specialist  03/31/22 9:10 AM

## 2022-03-31 NOTE — Discharge Summary (Signed)
Physician Discharge Summary   Patient: Shawn Andrews MRN: [id number] DOB: May 24, 1938  Admit date:     03/25/2022  Discharge date: 03/31/22  Discharge Physician: Fritzi Mandes   PCP: Leonel Ramsay, MD   Recommendations at discharge:    F/u your Cardiologist in 1 weeks F/u PCP in 1 week  Discharge Diagnoses: Principal Problem:   NSTEMI (non-ST elevated myocardial infarction) [facility]) Active Problems:   Acute on chronic systolic CHF (congestive heart failure) ([place])   Frequent PVCs   HFrEF (heart failure with reduced ejection fraction) ([place])   Chronic kidney disease, stage 3b ([place])   Suprapubic catheter ([place])   Essential hypertension   Essential tremor   Memory deficit   Shawn Andrews is a 89yoM with a PMH of CAD ([place] 12/2020 with 80% distal RCA, 50% prox RCA, 55% ost-prox Lcx, and 40% or less RPDA and LAD - medically managed), HFrEF (EF 30-35%, with rWMAs 09/2021), HTN, HLD, CKD 3, BPH, essential tremor and urinary retention with suprapubic catheter since 01/2021 who presented to [facility] ED the evening of 03/25/2022 with substernal chest discomfort persisting despite taking 2 SL nitroglycerin at home. Troponins peaked at 22K    Acute NSTEMI in a patient with underlying CAD: -- Troponins went from 32 to 22,486.  --Continue IV heparin drip and monitor heparin level per protocol--now d/ced post-cath --Continue aspirin, Plavix and rosuvastatin.   --3/6 s/p cath by  DR callwood   Coronaries Left main large moderate calcification LAD large proximal 75% lesion with moderate calcification mid LAD with a 90% lesion instent restenosis otherwise widely patent stents extensive prior stents TIMI-3 flow RCA very large proximal ulcerated plaque of around 75% with TIMI-3 flow Intervention PCI and stent to proximal RCA Placement of 3.5 x 15 mm DES to 15 atm Lesion reduced from 75 down to 0 --Pt will need staged mid LAD and instent restenosis likley tomorrow per Cardiology PA --Creat down to  1.8 --3/8--s/p stage LHC with mid LAD intervention with stent placement per cardiology. --pt doing well. Ambulated well around the nurses station.   Acute exacerbation of chronic systolic CHF  --received IV Lasix x1  --Euvolemic. Will resume home dose lasix at d/c   Acute hypoxic respiratory failure: Continue 3 L/min oxygen via [place].  Wean off oxygen as able. --sats 99% on 2liter   BPH with chronic urinary retention s/p suprapubic catheter: --on Proscar--can consider d/c after pt speak with urology on f/u --d/ced cardura due to drug interaction    CKD stage IIIb  --creat on admission 1.93--2.13--2.1--1.87--1.95--1.7 --baseline creat 1.5--1.67   Procedures Cardiac cath 03/27/22 and 03/29/22 Family communication :wife at bedside Consults :Cardiology CODE STATUS: FULL  D/c h ome. Pt and wife agreeable     Disposition: Home Diet recommendation:  Discharge Diet Orders (From admission, onward)     Start     Ordered   03/31/22 0000  Diet - low sodium heart healthy        03/31/22 0924           Cardiac diet DISCHARGE MEDICATION: Allergies as of 03/31/2022       Reactions   Penicillins Swelling   Of tongue Has patient had a PCN reaction causing immediate rash, facial/tongue/throat swelling, SOB or lightheadedness with hypotension: Yes Has patient had a PCN reaction causing severe rash involving mucus membranes or skin necrosis: No Has patient had a PCN reaction that required hospitalization: No Has patient had a PCN reaction occurring within the last 10 years:  No If all of the above answers are "NO", then may proceed with Cephalosporin use.   Cialis [tadalafil] Other (See Comments)   Indigestion and "felt like crap"   Lipitor [atorvastatin] Other (See Comments)   Severe myalgias   Lisinopril Cough   Dry nagging cough        Medication List     STOP taking these medications    doxazosin 4 MG tablet Commonly known as: CARDURA       TAKE these medications     acetaminophen 650 MG CR tablet Commonly known as: Tylenol 8 Hour Take 1 tablet (650 mg total) by mouth 2 (two) times a day.   aspirin 81 MG chewable tablet Chew 1 tablet by mouth daily.   clopidogrel 75 MG tablet Commonly known as: PLAVIX Take 75 mg by mouth daily.   cyanocobalamin 1000 MCG tablet Take 1,000 mcg by mouth daily.   docusate sodium 100 MG capsule Commonly known as: Colace Take 1 capsule (100 mg total) by mouth daily.   famotidine 20 MG tablet Commonly known as: PEPCID TAKE 1 TABLET BY MOUTH AT  BEDTIME   finasteride 5 MG tablet Commonly known as: PROSCAR TAKE 1 TABLET BY MOUTH  DAILY   furosemide 20 MG tablet Commonly known as: LASIX Take 1 tablet by mouth every other day.   hydrALAZINE 50 MG tablet Commonly known as: APRESOLINE Take 1 tablet (50 mg total) by mouth every 8 (eight) hours. What changed:  medication strength how much to take when to take this   isosorbide mononitrate 60 MG 24 hr tablet Commonly known as: IMDUR Take 1 tablet (60 mg total) by mouth 2 (two) times daily. What changed:  medication strength how much to take when to take this   loratadine 10 MG tablet Commonly known as: CLARITIN Take 1 tablet (10 mg total) by mouth daily as needed for allergies.   metoprolol tartrate 50 MG tablet Commonly known as: LOPRESSOR Take 0.5 tablets (25 mg total) by mouth 2 (two) times daily.   nitroGLYCERIN 0.4 MG SL tablet Commonly known as: NITROSTAT Place 1 tablet (0.4 mg total) under the tongue every 5 (five) minutes x 3 doses as needed for chest pain. What changed:  how much to take when to take this reasons to take this   pantoprazole 40 MG tablet Commonly known as: PROTONIX Take 40 mg by mouth 2 (two) times daily.   polyethylene glycol powder 17 GM/SCOOP powder Commonly known as: GLYCOLAX/MIRALAX Take 8.5-17 g by mouth daily as needed for moderate constipation.   primidone 50 MG tablet Commonly known as: MYSOLINE Take 2  tablets (100 mg total) by mouth 3 (three) times daily.   rosuvastatin 40 MG tablet Commonly known as: CRESTOR Take 1 tablet (40 mg total) by mouth at bedtime. What changed:  medication strength how much to take   Vitamin D3 25 MCG (1000 UT) capsule Generic drug: Cholecalciferol Take 1 capsule (1,000 Units total) by mouth daily.        Follow-up Information     Leonel Ramsay, MD. Schedule an appointment as soon as possible for a visit in 1 week(s).   Specialty: Infectious Diseases Contact information: [place] [address] [phone]         Lujean Amel D, MD. Schedule an appointment as soon as possible for a visit in 1 week(s).   Specialties: Cardiology, Internal Medicine Why: CAD Contact information: [place] [address] [phone]  Filed Weights   03/29/22 0417 03/30/22 0541 03/31/22 0700  Weight: 81.5 kg 82.2 kg 82.1 kg     Condition at discharge: fair  The results of significant diagnostics from this hospitalization (including imaging, microbiology, ancillary and laboratory) are listed below for reference.   Imaging Studies: CARDIAC CATHETERIZATION  Result Date: 03/29/2022   Mid LAD lesion is 95% stenosed.   Dist LAD lesion is 50% stenosed.   Dist LM to Ost LAD lesion is 60% stenosed.   Non-stenotic Prox RCA lesion was previously treated.   A stent was successfully placed.   Post intervention, there is a 0% residual stenosis. Conclusion Successful PCI and stent to in-stent restenosis of mid LAD placement of a 3.0 x 15 mm frontier Onyx to 16 atm Lesion reduced from 95 down to 0% TIMI-3 flow was maintained throughout Patient to be maintained on aspirin and Plavix indefinitely Hopefully patient is stable for transfer within 23 hours   CARDIAC CATHETERIZATION  Result Date: 03/29/2022   Mid LAD lesion is 95% stenosed.   Prox RCA lesion is 75% stenosed.   Dist LM to Ost LAD lesion is  60% stenosed.   Dist LAD lesion is 50% stenosed.   A drug-eluting stent was successfully placed using a STENT ONYX FRONTIER 3.5X15.   Post intervention, there is a 0% residual stenosis.   There is moderate left ventricular systolic dysfunction.   LV end diastolic pressure is mildly elevated.   The left ventricular ejection fraction is 35-45% by visual estimate. Conclusion Inpatient diagnostic cardiac cath because of non-STEMI Left heart cath right radial approach Left ventricular function EF moderately depressed at around 35 to 40% with anterior apical hypokinesis Coronaries Left main large short moderate to severe calcification 50% distal LAD large 50% proximal moderate calcification extensive stenting proximal to distal long area of stent with 1 focal area of in-stent restenosis 95% TIMI-3 flow Circumflex medium in size diffusely diseased 50% RCA large moderate calcification proximally 75% proximal lesion Right dominant system Intervention Successful PCI and stent to proximal RCA with DES 3.5 x 15 mm frontier Onyx to 15 atm Lesion reduced from 75 down to 0% TIMI-3 flow maintained throughout the case Patient maintained on aspirin Plavix TR band removed Patient transferred to progressive care Plan for staged intervention of LAD lesion prior to discharge  ECHOCARDIOGRAM COMPLETE  Result Date: 03/26/2022    ECHOCARDIOGRAM REPORT   Patient Name:   Shawn Andrews Date of Exam: 03/26/2022 Medical Rec #:  [id number]     Height:       70.0 in Accession #:    [id number]    Weight:       187.2 lb Date of Birth:  [birth year]     BSA:          2.029 m Patient Age:    7 years      BP:           108/54 mmHg Patient Gender: M             HR:           66 bpm. Exam Location:  [facility] Procedure: 2D Echo, Cardiac Doppler and Color Doppler Indications:     Chest Pain  History:         Patient has prior history of Echocardiogram examinations, most                  recent 10/04/2021. CHF, CAD and Acute MI, Arrythmias:PVC,  Signs/Symptoms:Chest Pain; Risk Factors:Hypertension and                  Dyslipidemia.  Sonographer:     Wenda Low Referring Phys:  [id number] [place] TANG Diagnosing Phys: Yolonda Kida MD IMPRESSIONS  1. Ant/Apical hypo.  2. Left ventricular ejection fraction, by estimation, is 30 to 35%. The left ventricle has moderately decreased function. The left ventricle demonstrates regional wall motion abnormalities (see scoring diagram/findings for description). The left ventricular internal cavity size was moderately dilated. There is mild concentric left ventricular hypertrophy. Left ventricular diastolic parameters are consistent with Grade I diastolic dysfunction (impaired relaxation).  3. Right ventricular systolic function is low normal. The right ventricular size is mildly enlarged. Mildly increased right ventricular wall thickness. There is severely elevated pulmonary artery systolic pressure.  4. Left atrial size was moderately dilated.  5. Right atrial size was mildly dilated.  6. The mitral valve is normal in structure. Mild mitral valve regurgitation.  7. Tricuspid valve regurgitation is mild to moderate.  8. The aortic valve is grossly normal. Aortic valve regurgitation is not visualized. FINDINGS  Left Ventricle: Left ventricular ejection fraction, by estimation, is 30 to 35%. The left ventricle has moderately decreased function. The left ventricle demonstrates regional wall motion abnormalities. The left ventricular internal cavity size was moderately dilated. There is mild concentric left ventricular hypertrophy. Left ventricular diastolic parameters are consistent with Grade I diastolic dysfunction (impaired relaxation). Right Ventricle: The right ventricular size is mildly enlarged. Mildly increased right ventricular wall thickness. Right ventricular systolic function is low normal. There is severely elevated pulmonary artery systolic pressure. The tricuspid regurgitant  velocity is 3.68  m/s, and with an assumed right atrial pressure of 8 mmHg, the estimated right ventricular systolic pressure is 0000000 mmHg. Left Atrium: Left atrial size was moderately dilated. Right Atrium: Right atrial size was mildly dilated. Pericardium: There is no evidence of pericardial effusion. Mitral Valve: The mitral valve is normal in structure. Mild mitral valve regurgitation. MV peak gradient, 3.4 mmHg. The mean mitral valve gradient is 1.0 mmHg. Tricuspid Valve: The tricuspid valve is normal in structure. Tricuspid valve regurgitation is mild to moderate. Aortic Valve: The aortic valve is grossly normal. Aortic valve regurgitation is not visualized. Aortic valve mean gradient measures 3.5 mmHg. Aortic valve peak gradient measures 7.2 mmHg. Aortic valve area, by VTI measures 3.00 cm. Pulmonic Valve: The pulmonic valve was grossly normal. Pulmonic valve regurgitation is not visualized. Aorta: The ascending aorta was not well visualized. IAS/Shunts: No atrial level shunt detected by color flow Doppler. Additional Comments: Ant/Apical hypo.  LEFT VENTRICLE PLAX 2D LVIDd:         5.30 cm   Diastology LVIDs:         4.40 cm   LV e' medial:    5.22 cm/s LV PW:         1.30 cm   LV E/e' medial:  12.5 LV IVS:        1.20 cm   LV e' lateral:   10.70 cm/s LVOT diam:     2.10 cm   LV E/e' lateral: 6.1 LV SV:         96 LV SV Index:   47 LVOT Area:     3.46 cm  RIGHT VENTRICLE RV Basal diam:  4.35 cm RV Mid diam:    3.70 cm RV S prime:     16.90 cm/s LEFT ATRIUM  Index        RIGHT ATRIUM           Index LA diam:        5.10 cm  2.51 cm/m   RA Area:     21.30 cm LA Vol (A2C):   97.6 ml  48.09 ml/m  RA Volume:   65.90 ml  32.47 ml/m LA Vol (A4C):   102.0 ml 50.26 ml/m LA Biplane Vol: 107.0 ml 52.73 ml/m  AORTIC VALVE                    PULMONIC VALVE AV Area (Vmax):    3.10 cm     PV Vmax:       1.08 m/s AV Area (Vmean):   2.85 cm     PV Peak grad:  4.7 mmHg AV Area (VTI):     3.00 cm AV Vmax:           134.00  cm/s AV Vmean:          83.950 cm/s AV VTI:            0.321 m AV Peak Grad:      7.2 mmHg AV Mean Grad:      3.5 mmHg LVOT Vmax:         120.00 cm/s LVOT Vmean:        69.100 cm/s LVOT VTI:          0.278 m LVOT/AV VTI ratio: 0.87  AORTA Ao Root diam: 3.30 cm MITRAL VALVE               TRICUSPID VALVE MV Area (PHT): 4.08 cm    TR Peak grad:   54.2 mmHg MV Area VTI:   3.06 cm    TR Vmax:        368.00 cm/s MV Peak grad:  3.4 mmHg MV Mean grad:  1.0 mmHg    SHUNTS MV Vmax:       0.93 m/s    Systemic VTI:  0.28 m MV Vmean:      52.2 cm/s   Systemic Diam: 2.10 cm MV Decel Time: 186 msec MV E velocity: 65.00 cm/s MV A velocity: 89.60 cm/s MV E/A ratio:  0.73 Dwayne D Callwood MD Electronically signed by Yolonda Kida MD Signature Date/Time: 03/26/2022/2:00:05 PM    Final    CT Angio Chest/Abd/Pel for Dissection W and/or Wo Contrast  Result Date: 03/25/2022 CLINICAL DATA:  Midsternal chest pain EXAM: CT ANGIOGRAPHY CHEST, ABDOMEN AND PELVIS TECHNIQUE: Non-contrast CT of the chest was initially obtained. Multidetector CT imaging through the chest, abdomen and pelvis was performed using the standard protocol during bolus administration of intravenous contrast. Multiplanar reconstructed images and MIPs were obtained and reviewed to evaluate the vascular anatomy. RADIATION DOSE REDUCTION: This exam was performed according to the departmental dose-optimization program which includes automated exposure control, adjustment of the mA and/or kV according to patient size and/or use of iterative reconstruction technique. CONTRAST:  55m OMNIPAQUE IOHEXOL 350 MG/ML SOLN COMPARISON:  CT abdomen/pelvis dated 02/25/2021. CT chest dated 01/09/2021. FINDINGS: CTA CHEST FINDINGS Cardiovascular: On unenhanced CT, there is no evidence of intramural hematoma. Following contrast administration, there is no evidence of thoracic aortic aneurysm or dissection. Atherosclerotic calcifications of the arch. Although not tailored for  evaluation of the pulmonary arteries, there is no evidence of pulmonary embolism to the lobar level. Mild cardiomegaly.  No pericardial effusion. Severe three-vessel coronary atherosclerosis. Mediastinum/Nodes: No suspicious mediastinal lymphadenopathy. Visualized  thyroid is unremarkable. Lungs/Pleura: Moderate left and small right pleural effusions. Additional faint ground-glass opacities in the lungs bilaterally, with mild interlobular septal thickening in the upper lobes, favoring mild interstitial edema. Mild dependent atelectasis in the posterior upper and lower lobes. No focal consolidation. No pneumothorax. Musculoskeletal: Degenerative changes of the mid/lower thoracic spine. Review of the MIP images confirms the above findings. CTA ABDOMEN AND PELVIS FINDINGS VASCULAR Aorta: No evidence of abdominal aortic aneurysm or dissection. Patent. Atherosclerotic calcifications. Celiac: Patent.  Atherosclerotic calcifications. SMA: Patent.  Atherosclerotic calcifications at the origin. Renals: Patent. Atherosclerotic calcifications at the origin bilaterally. IMA: Patent. Inflow: Patent bilaterally.  Atherosclerotic calcifications. Veins: Grossly unremarkable. Review of the MIP images confirms the above findings. NON-VASCULAR Hepatobiliary: Liver is within normal limits. Gallbladder is unremarkable. No intrahepatic or extrahepatic ductal dilatation. Pancreas: Within normal limits. Spleen: Within normal limits. Adrenals/Urinary Tract: Adrenal glands are within normal limits. Bilateral renal cysts, measuring up to 3.2 cm in the left upper kidney (series 5/image 152), benign (Bosniak I). No follow-up is recommended. No hydronephrosis. Bladder is not decompressed with an indwelling suprapubic Foley catheter. Stomach/Bowel: Stomach is within normal limits. No evidence of bowel obstruction. Normal appendix (series 5/image 226). Extensive scattered colonic diverticulosis, without evidence of diverticulitis. Lymphatic: No  suspicious abdominopelvic lymphadenopathy. Reproductive: Prostatomegaly, with enlargement of the central gland which indents the base of the bladder, suggesting BPH. Other: Trace pelvic ascites (series 5/image 273). Musculoskeletal: Grade 1 spondylolisthesis at L5-S1, chronic. Mild degenerative changes of the lumbar spine. Review of the MIP images confirms the above findings. IMPRESSION: No evidence of thoracoabdominal aortic aneurysm or dissection. No evidence of pulmonary embolism. Cardiomegaly with mild interstitial edema. Moderate left and small right pleural effusions. Additional ancillary findings as above. Electronically Signed   By: Julian Hy M.D.   On: 03/25/2022 22:43   DG Chest Port 1 View  Result Date: 03/25/2022 CLINICAL DATA:  Chest pain. EXAM: PORTABLE CHEST 1 VIEW COMPARISON:  Chest radiograph dated 10/03/2021. FINDINGS: There is mild cardiomegaly. Mild vascular congestion and edema. No focal consolidation, pleural effusion, or pneumothorax. Atherosclerotic calcification of the aorta. No acute osseous pathology. IMPRESSION: Cardiomegaly with mild vascular congestion and edema. Electronically Signed   By: Anner Crete M.D.   On: 03/25/2022 20:41    Microbiology: Results for orders placed or performed during the hospital encounter of 03/25/22  Resp panel by RT-PCR (RSV, Flu A&B, Covid) Anterior Nasal Swab     Status: None   Collection Time: 03/25/22 10:13 PM   Specimen: Anterior Nasal Swab  Result Value Ref Range Status   SARS Coronavirus 2 by RT PCR NEGATIVE NEGATIVE Final    Comment: (NOTE) SARS-CoV-2 target nucleic acids are NOT DETECTED.  The SARS-CoV-2 RNA is generally detectable in upper respiratory specimens during the acute phase of infection. The lowest concentration of SARS-CoV-2 viral copies this assay can detect is 138 copies/mL. A negative result does not preclude SARS-Cov-2 infection and should not be used as the sole basis for treatment or other patient  management decisions. A negative result may occur with  improper specimen collection/handling, submission of specimen other than nasopharyngeal swab, presence of viral mutation(s) within the areas targeted by this assay, and inadequate number of viral copies(<138 copies/mL). A negative result must be combined with clinical observations, patient history, and epidemiological information. The expected result is Negative.  Fact Sheet for Patients:  EntrepreneurPulse.com.au  Fact Sheet for Healthcare Providers:  IncredibleEmployment.be  This test is no t yet approved or cleared by the  [place] Architectural technologist and  has been authorized for detection and/or diagnosis of SARS-CoV-2 by FDA under an Print production planner (EUA). This EUA will remain  in effect (meaning this test can be used) for the duration of the COVID-19 declaration under Section 564(b)(1) of the Act, 21 U.S.C.section 360bbb-3(b)(1), unless the authorization is terminated  or revoked sooner.       Influenza A by PCR NEGATIVE NEGATIVE Final   Influenza B by PCR NEGATIVE NEGATIVE Final    Comment: (NOTE) The Xpert Xpress SARS-CoV-2/FLU/RSV plus assay is intended as an aid in the diagnosis of influenza from Nasopharyngeal swab specimens and should not be used as a sole basis for treatment. Nasal washings and aspirates are unacceptable for Xpert Xpress SARS-CoV-2/FLU/RSV testing.  Fact Sheet for Patients: EntrepreneurPulse.com.au  Fact Sheet for Healthcare Providers: IncredibleEmployment.be  This test is not yet approved or cleared by the [place] FDA and has been authorized for detection and/or diagnosis of SARS-CoV-2 by FDA under an Emergency Use Authorization (EUA). This EUA will remain in effect (meaning this test can be used) for the duration of the COVID-19 declaration under Section 564(b)(1) of the Act, 21 U.S.C. section 360bbb-3(b)(1),  unless the authorization is terminated or revoked.     Resp Syncytial Virus by PCR NEGATIVE NEGATIVE Final    Comment: (NOTE) Fact Sheet for Patients: EntrepreneurPulse.com.au  Fact Sheet for Healthcare Providers: IncredibleEmployment.be  This test is not yet approved or cleared by the [place] FDA and has been authorized for detection and/or diagnosis of SARS-CoV-2 by FDA under an Emergency Use Authorization (EUA). This EUA will remain in effect (meaning this test can be used) for the duration of the COVID-19 declaration under Section 564(b)(1) of the Act, 21 U.S.C. section 360bbb-3(b)(1), unless the authorization is terminated or revoked.  Performed at [facility], [address] [place], [place] [address]     Labs: CBC: Recent Labs  Lab 03/25/22 2019 03/27/22 0418 03/28/22 0433 03/30/22 0608  WBC 9.1 7.2 6.4 7.0  HGB 11.7* 9.7* 9.4* 9.8*  HCT 36.2* 28.9* 28.2* 29.2*  MCV 92.8 89.5 87.3 87.7  PLT 173 158 155 [id number]   Basic Metabolic Panel: Recent Labs  Lab 03/26/22 1211 03/27/22 0418 03/28/22 0433 03/29/22 0514 03/30/22 0608  NA 139 136 136 137 138  K 5.5* 4.8 4.7 4.4 4.4  CL 109 106 106 107 110  CO2 17* '24 22 22 '$ 20*  GLUCOSE 133* 115* 103* 100* 106*  BUN 43* 49* 44* 43* 36*  CREATININE 2.13* 2.10* 1.87* 1.95* 1.75*  CALCIUM 8.8* 8.4* 8.3* 8.5* 8.4*  MG  --  2.2  --   --   --    Liver Function Tests: Recent Labs  Lab 03/25/22 2019  AST 18  ALT 12  ALKPHOS 68  BILITOT 0.4  PROT 6.8  ALBUMIN 4.4   CBG: Recent Labs  Lab 03/28/22 1629  GLUCAP 153*    Discharge time spent: greater than 30 minutes.  Signed: Fritzi Mandes, MD [facility] 03/31/2022

## 2022-04-01 ENCOUNTER — Encounter: Payer: Self-pay | Admitting: Internal Medicine

## 2022-04-02 NOTE — Telephone Encounter (Signed)
Shawn Riis, PA-C sent to Shawn Andrews, CMA We didn't prescribe any prostate medications.  He was already on the Cardura and Proscar when he came and saw [place] initially.  We typically do not stop the Cardura when it is prescribed by another provider as it is typically used for both blood pressure control and BPH and we do not want to interrupt the patient's blood pressure control program.  It looks like the hospital has went ahead and discontinued the Cardura from his last admission and started other medications for his BP and heart, so there is no need to restart that medication.  The Proscar helps to keep the prostate small in size and helps prevent bleeding episodes from the prostate and the prostate can still bleed even if a suprapubic tube is in place, so it is not harmful to stay on that medication at this  Patient notified and voiced understanding. He will stay on Proscar.

## 2022-04-03 ENCOUNTER — Encounter: Payer: Self-pay | Admitting: Family

## 2022-04-03 ENCOUNTER — Ambulatory Visit: Payer: Medicare Other | Attending: Family | Admitting: Family

## 2022-04-03 VITALS — BP 131/55 | HR 65 | Wt 186.4 lb

## 2022-04-03 DIAGNOSIS — E785 Hyperlipidemia, unspecified: Secondary | ICD-10-CM | POA: Insufficient documentation

## 2022-04-03 DIAGNOSIS — Z955 Presence of coronary angioplasty implant and graft: Secondary | ICD-10-CM | POA: Diagnosis not present

## 2022-04-03 DIAGNOSIS — I2510 Atherosclerotic heart disease of native coronary artery without angina pectoris: Secondary | ICD-10-CM | POA: Insufficient documentation

## 2022-04-03 DIAGNOSIS — I5022 Chronic systolic (congestive) heart failure: Secondary | ICD-10-CM

## 2022-04-03 DIAGNOSIS — F17290 Nicotine dependence, other tobacco product, uncomplicated: Secondary | ICD-10-CM | POA: Insufficient documentation

## 2022-04-03 DIAGNOSIS — M1990 Unspecified osteoarthritis, unspecified site: Secondary | ICD-10-CM | POA: Insufficient documentation

## 2022-04-03 DIAGNOSIS — Z7902 Long term (current) use of antithrombotics/antiplatelets: Secondary | ICD-10-CM | POA: Diagnosis not present

## 2022-04-03 DIAGNOSIS — N400 Enlarged prostate without lower urinary tract symptoms: Secondary | ICD-10-CM | POA: Diagnosis not present

## 2022-04-03 DIAGNOSIS — I10 Essential (primary) hypertension: Secondary | ICD-10-CM

## 2022-04-03 DIAGNOSIS — I110 Hypertensive heart disease with heart failure: Secondary | ICD-10-CM | POA: Diagnosis present

## 2022-04-03 NOTE — Patient Instructions (Signed)
Call us in the future if you need us for anything 

## 2022-04-03 NOTE — Progress Notes (Signed)
Patient ID: Shawn Andrews, male    DOB: [birth year], 89 y.o.   MRN: [id number]  HPI  Shawn Andrews is a 89 y/o male with a history of CAD, hyperlipidemia, HTN, BPH, osteoarthritis, previous tobacco use and chronic heart failure.   Echo 03/26/22: EF 30-35% along with mild LVH, severely elevated PA pressure, moderate LAE, mild Shawn and mild/ moderate TR. Echo 11/14/20: EF of 40% along with moderate LAE and moderate Shawn/TR.   LHC 03/27/22:   Mid LAD lesion is 95% stenosed.   Prox RCA lesion is 75% stenosed.   Dist LM to Ost LAD lesion is 60% stenosed.   Dist LAD lesion is 50% stenosed.   A drug-eluting stent was successfully placed using a STENT ONYX FRONTIER 3.5X15.   Post intervention, there is a 0% residual stenosis.   There is moderate left ventricular systolic dysfunction.   LV end diastolic pressure is mildly elevated.   The left ventricular ejection fraction is 35-45% by visual estimate.  Conclusion Left ventricular function EF moderately depressed at around 35 to 40% with anterior apical hypokinesis Left main large short moderate to severe calcification 50% distal LAD large 50% proximal moderate calcification extensive stenting proximal to distal long area of stent with 1 focal area of in-stent restenosis 95% TIMI-3 flow Circumflex medium in size diffusely diseased 50% RCA large moderate calcification proximally 75% proximal lesion Right dominant system  Intervention Successful PCI and stent to proximal RCA with DES 3.5 x 15 mm frontier Onyx to 15 atm Lesion reduced from 75 down to 0% TIMI-3 flow maintained throughout the case Plan for staged intervention of LAD lesion prior to discharge  LHC done 01/18/21 and showed: Dist RCA lesion is 80% stenosed.   Prox RCA lesion is 50% stenosed.   Ost Cx to Prox Cx lesion is 55% stenosed.   Mid Cx lesion is 50% stenosed.   RPDA lesion is 40% stenosed.   Ost LAD to Prox LAD lesion is 40% stenosed.   Prox LAD to Mid LAD lesion is 10% stenosed.    Mid LAD to Dist LAD lesion is 45% stenosed. Patent stent to proximal to mid LAD with other diffuse disease in distal LAD and diagonal 2 Mild diffuse disease of circumflex obtuse marginal Moderate to severe disease with calcification of proximal and distal right coronary artery  Admitted 03/25/22 due to chest pain unrelieved with SL NTG. Troponins peaked at 22,486. Cath done with PCI/ stent to proximal RCA & then stent placed in LAD 2 days later. IV lasix for HF exacerbation. Weaned off of oxygen.                               He presents today with a chief complaint of a f/u visit although it's been over 1 year since last here. He specifically denies dizziness, difficulty sleeping, abdominal distention, pedal edema, chest pain, palpitations, SOB, cough or fatigue.   Had 2 stents recently placed and will be starting cardiac rehab next week.   Past Medical History:  Diagnosis Date   Abnormal drug screen 06/22/2014   inapprop neg xanax rpt 3 mo (06/2014)   BPH (benign prostatic hypertrophy) 01/21/1998   has had 3 biopsies in past ([facility]) decided to stop PSA/DRE   CHF (congestive heart failure) (HCC)    Coronary artery disease    Hyperlipidemia 01/21/2002   Hypertension 05/22/2003   Left lumbar radiculopathy    Osteoarthritis 01/22/1988  knees, lumbar spondylosis and listhesis   Past Surgical History:  Procedure Laterality Date   CATARACT EXTRACTION  2013   bilateral   CORONARY ANGIOGRAPHY N/A 03/29/2022   Procedure: CORONARY ANGIOGRAPHY;  Surgeon: Yolonda Kida, MD;  Location: [facility];  Service: Cardiovascular;  Laterality: N/A;   CORONARY STENT INTERVENTION N/A 03/27/2022   Procedure: CORONARY STENT INTERVENTION;  Surgeon: Yolonda Kida, MD;  Location: [place] [facility];  Service: Cardiovascular;  Laterality: N/A;   CORONARY STENT INTERVENTION N/A 03/29/2022   Procedure: CORONARY STENT INTERVENTION;  Surgeon: Yolonda Kida, MD;  Location: [place] [facility];  Service: Cardiovascular;  Laterality: N/A;   ESI Left 08/2014, 11/2014, 08/2015   L S1, L L5/S1 transforaminal ESI; L4/5 L5/S1 zygapophysial injections, L S1 transforaminal ([facility])   FINGER SURGERY     right middle, partial traumatic amputation   IR CATHETER TUBE CHANGE  03/02/2021   KNEE ARTHROSCOPY  1990   right   KNEE SURGERY  04/2006   right partial knee replacement in [place] - rec ppx abx for any invasive procedure   LEFT HEART CATH AND CORONARY ANGIOGRAPHY N/A 01/18/2021   Procedure: LEFT HEART CATH AND CORONARY ANGIOGRAPHY;  Surgeon: Corey Skains, MD;  Location: [facility];  Service: Cardiovascular;  Laterality: N/A;   LEFT HEART CATH AND CORONARY ANGIOGRAPHY N/A 03/27/2022   Procedure: LEFT HEART CATH AND CORONARY ANGIOGRAPHY;  Surgeon: Yolonda Kida, MD;  Location: [facility];  Service: Cardiovascular;  Laterality: N/A;   Family History  Problem Relation Age of Onset   Hypertension Mother    Heart disease Brother 75       MI   Diabetes Brother    Cancer Daughter        melanoma   Stroke Neg Hx    Social History   Tobacco Use   Smoking status: Former    Packs/day: 0.00    Years: 0.00    Total pack years: 0.00    Types: Pipe, Cigarettes    Quit date: 12/26/2000    Years since quitting: 21.2    Passive exposure: Past   Smokeless tobacco: Current    Types: Chew   Tobacco comments:    quit over 30 years ago  Substance Use Topics   Alcohol use: Yes    Alcohol/week: 0.0 standard drinks of alcohol    Comment: Occasional beer   Allergies  Allergen Reactions   Penicillins Swelling    Of tongue Has patient had a PCN reaction causing immediate rash, facial/tongue/throat swelling, SOB or lightheadedness with hypotension: Yes Has patient had a PCN reaction causing severe rash involving mucus membranes or skin necrosis: No Has patient had a PCN reaction that required hospitalization: No Has patient had a PCN reaction occurring within the  last 10 years: No If all of the above answers are "NO", then may proceed with Cephalosporin use.   Cialis [Tadalafil] Other (See Comments)    Indigestion and "felt like crap"   Lipitor [Atorvastatin] Other (See Comments)    Severe myalgias   Lisinopril Cough    Dry nagging cough   Prior to Admission medications   Medication Sig Start Date End Date Taking? Authorizing Provider  acetaminophen (TYLENOL 8 HOUR) 650 MG CR tablet Take 1 tablet (650 mg total) by mouth 2 (two) times a day. 06/24/18  Yes Ria Bush, MD  aspirin 81 MG chewable tablet Chew 1 tablet by mouth daily.   Yes [provider]  Cholecalciferol (VITAMIN D3) 25 MCG (1000 UT) CAPS Take 1 capsule (1,000 Units total) by mouth daily. 08/25/18  Yes Ria Bush, MD  clopidogrel (PLAVIX) 75 MG tablet Take 75 mg by mouth daily. 10/10/20  Yes [provider]  cyanocobalamin 1000 MCG tablet Take 1,000 mcg by mouth daily.   Yes [provider]  docusate sodium (COLACE) 100 MG capsule Take 1 capsule (100 mg total) by mouth daily. 12/13/20  Yes Ria Bush, MD  famotidine (PEPCID) 20 MG tablet TAKE 1 TABLET BY MOUTH AT  BEDTIME Patient taking differently: Take 20 mg by mouth at bedtime. 01/23/21  Yes Ria Bush, MD  finasteride (PROSCAR) 5 MG tablet TAKE 1 TABLET BY MOUTH  DAILY Patient taking differently: Take 5 mg by mouth daily. 11/17/19  Yes Ria Bush, MD  furosemide (LASIX) 20 MG tablet Take 1 tablet by mouth every other day. 10/09/20  Yes [provider]  hydrALAZINE (APRESOLINE) 50 MG tablet Take 1 tablet (50 mg total) by mouth every 8 (eight) hours. 03/31/22  Yes Fritzi Mandes, MD  isosorbide mononitrate (IMDUR) 60 MG 24 hr tablet Take 1 tablet (60 mg total) by mouth 2 (two) times daily. 03/31/22  Yes Fritzi Mandes, MD  loratadine (CLARITIN) 10 MG tablet Take 1 tablet (10 mg total) by mouth daily as needed for allergies. 12/22/12  Yes Ria Bush, MD  metoprolol tartrate  (LOPRESSOR) 50 MG tablet Take 0.5 tablets (25 mg total) by mouth 2 (two) times daily. 10/16/20  Yes Ria Bush, MD  pantoprazole (PROTONIX) 40 MG tablet Take 40 mg by mouth 2 (two) times daily. 02/14/22 02/14/23 Yes [provider]  polyethylene glycol powder (GLYCOLAX/MIRALAX) 17 GM/SCOOP powder Take 8.5-17 g by mouth daily as needed for moderate constipation. 12/13/20  Yes Ria Bush, MD  primidone (MYSOLINE) 50 MG tablet Take 2 tablets (100 mg total) by mouth 3 (three) times daily. 10/16/20  Yes Ria Bush, MD  rosuvastatin (CRESTOR) 40 MG tablet Take 1 tablet (40 mg total) by mouth at bedtime. 03/31/22  Yes Fritzi Mandes, MD  nitroGLYCERIN (NITROSTAT) 0.4 MG SL tablet Place 1 tablet (0.4 mg total) under the tongue every 5 (five) minutes x 3 doses as needed for chest pain. 03/31/22   Fritzi Mandes, MD   Review of Systems  Constitutional:  Negative for appetite change and fatigue.  HENT:  Negative for congestion, postnasal drip and sore throat.   Eyes: Negative.   Respiratory:  Negative for cough, chest tightness and shortness of breath.   Cardiovascular:  Negative for chest pain, palpitations and leg swelling.  Gastrointestinal:  Negative for abdominal distention and abdominal pain.  Endocrine: Negative.   Musculoskeletal:  Negative for back pain and neck pain.  Skin: Negative.   Allergic/Immunologic: Negative.   Neurological:  Negative for dizziness and light-headedness.  Hematological:  Negative for adenopathy. Does not bruise/bleed easily.  Psychiatric/Behavioral:  Negative for dysphoric mood and sleep disturbance (sleeping on 1 pillow). The patient is not nervous/anxious.    Vitals:   04/03/22 1602  BP: (!) 131/55  Pulse: 65  SpO2: 99%  Weight: 186 lb 6.4 oz (84.6 kg)   Wt Readings from Last 3 Encounters:  04/03/22 186 lb 6.4 oz (84.6 kg)  03/31/22 181 lb (82.1 kg)  03/15/22 185 lb (83.9 kg)   Lab Results  Component Value Date   CREATININE 1.75 (H)  03/30/2022   CREATININE 1.95 (H) 03/29/2022   CREATININE 1.87 (H) 03/28/2022   Physical Exam Vitals and nursing  note reviewed. Exam conducted with a chaperone present (wife).  Constitutional:      Appearance: Normal appearance.  HENT:     Head: Normocephalic and atraumatic.  Cardiovascular:     Rate and Rhythm: Normal rate and regular rhythm.  Pulmonary:     Effort: Pulmonary effort is normal. No respiratory distress.     Breath sounds: No wheezing or rales.  Abdominal:     General: There is no distension.     Palpations: Abdomen is soft.  Musculoskeletal:        General: No tenderness.     Cervical back: Normal range of motion and neck supple.     Right lower leg: No edema.     Left lower leg: No edema.  Skin:    General: Skin is warm and dry.  Neurological:     General: No focal deficit present.     Mental Status: He is alert and oriented to person, place, and time.  Psychiatric:        Mood and Affect: Mood normal.        Behavior: Behavior normal.        Thought Content: Thought content normal.    Assessment & Plan:  1: Chronic heart failure with reduced ejection fraction- - NYHA class I - euvolemic today - weighing daily; reminded to call for an overnight weight gain of > 2 pounds or a weekly weight gain of >5 pounds - weight up 9 pounds from last visit here 1 year ago - not adding salt and wife is reading food labels for sodium content and adjusting meals based on sodium content - drinking ~ 64 ounces daily - on GDMT of losartan and metoprolol (although tartrate) - cough with lisinopril - BNP 03/25/22 was 598.2  2: HTN- - BP 131/55 - saw PCP Ola Spurr) 01/03/22; returns 04/05/22 - BMP 03/30/22 reviewed and showed sodium 138, potassium 4.4, creatinine 1.75 & GFR 36  3: CAD- - saw cardiology Clayborn Bigness) 02/25/22; returns 04/10/22 - on clopidogrel - will be starting cardiac rehab next week  Patient prefers to not make another appointment at this time and hopes to  make it at least another year before having to return. Advised him that he could call at anytime to make another appointment

## 2022-04-08 ENCOUNTER — Encounter: Payer: Medicare Other | Attending: Internal Medicine

## 2022-04-08 ENCOUNTER — Other Ambulatory Visit: Payer: Self-pay

## 2022-04-08 DIAGNOSIS — Z48812 Encounter for surgical aftercare following surgery on the circulatory system: Secondary | ICD-10-CM | POA: Insufficient documentation

## 2022-04-08 DIAGNOSIS — I214 Non-ST elevation (NSTEMI) myocardial infarction: Secondary | ICD-10-CM

## 2022-04-08 DIAGNOSIS — I252 Old myocardial infarction: Secondary | ICD-10-CM | POA: Insufficient documentation

## 2022-04-08 DIAGNOSIS — Z955 Presence of coronary angioplasty implant and graft: Secondary | ICD-10-CM | POA: Insufficient documentation

## 2022-04-08 NOTE — Progress Notes (Signed)
In person Visit completed. Patient informed on EP and RD appointment and 6 Minute walk test. Patient also informed of patient health questionnaires on My Chart. Patient Verbalizes understanding. Visit diagnosis can be found in [facility] 03/25/2022.

## 2022-04-10 NOTE — Progress Notes (Signed)
Suprapubic Cath Change  Patient is present today for a suprapubic catheter change due to urinary retention.  9 ml of water was drained from the balloon, a 16 FR foley cath was removed from the tract with out difficulty.  Site was cleaned and prepped in a sterile fashion with betadine.  A 16 FR foley cath was replaced into the tract no complications were noted. Urine return was noted, 10 ml of sterile water was inflated into the balloon and a leg bag was attached for drainage.  Patient tolerated well. A night bag was given to patient and proper instruction was given on how to switch bags.    Performed by: Zara Council, PA-C   Follow up: 6 weeks for SPT exchange

## 2022-04-11 VITALS — Ht 70.5 in | Wt 181.8 lb

## 2022-04-11 DIAGNOSIS — Z48812 Encounter for surgical aftercare following surgery on the circulatory system: Secondary | ICD-10-CM | POA: Diagnosis present

## 2022-04-11 DIAGNOSIS — I252 Old myocardial infarction: Secondary | ICD-10-CM | POA: Diagnosis not present

## 2022-04-11 DIAGNOSIS — Z955 Presence of coronary angioplasty implant and graft: Secondary | ICD-10-CM

## 2022-04-11 DIAGNOSIS — I214 Non-ST elevation (NSTEMI) myocardial infarction: Secondary | ICD-10-CM

## 2022-04-11 NOTE — Progress Notes (Signed)
Cardiac Individual Treatment Plan  Patient Details  Name: Shawn Andrews MRN: [id number] Date of Birth: 1928-10-14 Referring Provider:   Flowsheet Row Cardiac Rehab from 04/11/2022 in [facility] Cardiac and Pulmonary Rehab  Referring Provider Dr. Lujean Amel, MD       Initial Encounter Date:  Flowsheet Row Cardiac Rehab from 04/11/2022 in [facility] Cardiac and Pulmonary Rehab  Date 04/11/22       Visit Diagnosis: NSTEMI (non-ST elevated myocardial infarction) [facility])  Status post coronary artery stent placement  Patient's Home Medications on Admission:  Current Outpatient Medications:    acetaminophen (TYLENOL 8 HOUR) 650 MG CR tablet, Take 1 tablet (650 mg total) by mouth 2 (two) times a day., Disp: , Rfl:    aspirin 81 MG chewable tablet, Chew 1 tablet by mouth daily., Disp: , Rfl:    Cholecalciferol (VITAMIN D3) 25 MCG (1000 UT) CAPS, Take 1 capsule (1,000 Units total) by mouth daily., Disp: 30 capsule, Rfl:    clopidogrel (PLAVIX) 75 MG tablet, Take 75 mg by mouth daily., Disp: , Rfl:    cyanocobalamin 1000 MCG tablet, Take 1,000 mcg by mouth daily., Disp: , Rfl:    docusate sodium (COLACE) 100 MG capsule, Take 1 capsule (100 mg total) by mouth daily., Disp: , Rfl:    famotidine (PEPCID) 20 MG tablet, TAKE 1 TABLET BY MOUTH AT  BEDTIME (Patient taking differently: Take 20 mg by mouth at bedtime.), Disp: 90 tablet, Rfl: 3   finasteride (PROSCAR) 5 MG tablet, TAKE 1 TABLET BY MOUTH  DAILY (Patient taking differently: Take 5 mg by mouth daily.), Disp: 90 tablet, Rfl: 3   furosemide (LASIX) 20 MG tablet, Take 1 tablet by mouth every other day., Disp: , Rfl:    hydrALAZINE (APRESOLINE) 50 MG tablet, Take 1 tablet (50 mg total) by mouth every 8 (eight) hours., Disp: 90 tablet, Rfl: 0   isosorbide mononitrate (IMDUR) 60 MG 24 hr tablet, Take 1 tablet (60 mg total) by mouth 2 (two) times daily., Disp: 60 tablet, Rfl: 1   loratadine (CLARITIN) 10 MG tablet, Take 1 tablet (10 mg total) by mouth  daily as needed for allergies., Disp: 30 tablet, Rfl:    metoprolol tartrate (LOPRESSOR) 50 MG tablet, Take 0.5 tablets (25 mg total) by mouth 2 (two) times daily., Disp: , Rfl:    nitroGLYCERIN (NITROSTAT) 0.4 MG SL tablet, Place 1 tablet (0.4 mg total) under the tongue every 5 (five) minutes x 3 doses as needed for chest pain., Disp: 30 tablet, Rfl: 12   pantoprazole (PROTONIX) 40 MG tablet, Take 40 mg by mouth 2 (two) times daily., Disp: , Rfl:    polyethylene glycol powder (GLYCOLAX/MIRALAX) 17 GM/SCOOP powder, Take 8.5-17 g by mouth daily as needed for moderate constipation. (Patient not taking: Reported on 04/08/2022), Disp: 3350 g, Rfl: 0   primidone (MYSOLINE) 50 MG tablet, Take 2 tablets (100 mg total) by mouth 3 (three) times daily., Disp: , Rfl:    rosuvastatin (CRESTOR) 40 MG tablet, Take 1 tablet (40 mg total) by mouth at bedtime., Disp: 30 tablet, Rfl: 2  Past Medical History: Past Medical History:  Diagnosis Date   Abnormal drug screen 06/22/2014   inapprop neg xanax rpt 3 mo (06/2014)   BPH (benign prostatic hypertrophy) 01/21/1998   has had 3 biopsies in past ([facility]) decided to stop PSA/DRE   CHF (congestive heart failure) (HCC)    Coronary artery disease    Hyperlipidemia 01/21/2002   Hypertension 05/22/2003   Left lumbar radiculopathy  Osteoarthritis 01/22/1988   knees, lumbar spondylosis and listhesis    Tobacco Use: Social History   Tobacco Use  Smoking Status Former   Packs/day: 0.00   Years: 0.00   Additional pack years: 0.00   Total pack years: 0.00   Types: Pipe, Cigarettes   Quit date: 12/26/2000   Years since quitting: 21.3   Passive exposure: Past  Smokeless Tobacco Current   Types: Chew  Tobacco Comments   quit over 30 years ago    Labs: Review Flowsheet  More data exists      Latest Ref Rng & Units 08/18/2018 08/25/2019 08/25/2020 01/17/2021 01/18/2021  Labs for ITP Cardiac and Pulmonary Rehab  Cholestrol 0 - 200 mg/dL 190  202  212  - 157    LDL (calc) 0 - 99 mg/dL 129  134  138  - 99   HDL-C >40 mg/dL 43.50  45.80  49.80  - 45   Trlycerides <150 mg/dL 88.0  110.0  119.0  - 66   Hemoglobin A1c 4.8 - 5.6 % - - - 5.0  -     Exercise Target Goals: Exercise Program Goal: Individual exercise prescription set using results from initial 6 min walk test and THRR while considering  patient's activity barriers and safety.   Exercise Prescription Goal: Initial exercise prescription builds to 30-45 minutes a day of aerobic activity, 2-3 days per week.  Home exercise guidelines will be given to patient during program as part of exercise prescription that the participant will acknowledge.   Education: Aerobic Exercise: - Group verbal and visual presentation on the components of exercise prescription. Introduces F.I.T.T principle from ACSM for exercise prescriptions.  Reviews F.I.T.T. principles of aerobic exercise including progression. Written material given at graduation. Flowsheet Row Cardiac Rehab from 04/11/2022 in [facility] Cardiac and Pulmonary Rehab  Education need identified 04/11/22       Education: Resistance Exercise: - Group verbal and visual presentation on the components of exercise prescription. Introduces F.I.T.T principle from ACSM for exercise prescriptions  Reviews F.I.T.T. principles of resistance exercise including progression. Written material given at graduation. Flowsheet Row Cardiac Rehab from 09/13/2021 in [facility] Cardiac and Pulmonary Rehab  Date 08/09/21  Educator [facility]  Instruction Review Code 1- Verbalizes Understanding        Education: Exercise & Equipment Safety: - Individual verbal instruction and demonstration of equipment use and safety with use of the equipment. Flowsheet Row Cardiac Rehab from 04/11/2022 in [facility] Cardiac and Pulmonary Rehab  Education need identified 04/08/22  Date 04/08/22  Educator jh  Instruction Review Code 1- Verbalizes Understanding       Education: Exercise Physiology &  General Exercise Guidelines: - Group verbal and written instruction with models to review the exercise physiology of the cardiovascular system and associated critical values. Provides general exercise guidelines with specific guidelines to those with heart or lung disease.  Flowsheet Row Cardiac Rehab from 09/13/2021 in [facility] Cardiac and Pulmonary Rehab  Education need identified 04/03/21       Education: Flexibility, Balance, Mind/Body Relaxation: - Group verbal and visual presentation with interactive activity on the components of exercise prescription. Introduces F.I.T.T principle from ACSM for exercise prescriptions. Reviews F.I.T.T. principles of flexibility and balance exercise training including progression. Also discusses the mind body connection.  Reviews various relaxation techniques to help reduce and manage stress (i.e. Deep breathing, progressive muscle relaxation, and visualization). Balance handout provided to take home. Written material given at graduation. Flowsheet Row Cardiac Rehab from 09/13/2021 in [facility] Cardiac  and Pulmonary Rehab  Date 08/09/21  Educator [facility]  Instruction Review Code 1- Verbalizes Understanding       Activity Barriers & Risk Stratification:  Activity Barriers & Cardiac Risk Stratification - 04/11/22 1332       Activity Barriers & Cardiac Risk Stratification   Activity Barriers Decreased Ventricular Function;Joint Problems    Comments Suprapubic catheter- reports no limitation or problems with movement or activity, R Shoulder pain, L hip pain    Cardiac Risk Stratification High             6 Minute Walk:  6 Minute Walk     Row Name 04/11/22 1327         6 Minute Walk   Phase Initial     Distance 910 feet     Walk Time 6 minutes     # of Rest Breaks 0     MPH 1.72     METS 1.2     RPE 11     Perceived Dyspnea  1     VO2 Peak 4.19     Symptoms Yes (comment)     Comments hip pain     Resting HR 57 bpm     Resting BP 138/60      Resting Oxygen Saturation  97 %     Exercise Oxygen Saturation  during 6 min walk 96 %     Max Ex. HR 82 bpm     Max Ex. BP 152/54     2 Minute Post BP 142/52              Oxygen Initial Assessment:   Oxygen Re-Evaluation:   Oxygen Discharge (Final Oxygen Re-Evaluation):   Initial Exercise Prescription:  Initial Exercise Prescription - 04/11/22 1300       Date of Initial Exercise RX and Referring Provider   Date 04/11/22    Referring Provider Dr. Lujean Amel, MD      Oxygen   Maintain Oxygen Saturation 88% or higher      Recumbant Bike   Level 2    Watts 15    Minutes 15    METs 1.2      NuStep   Level 2    Minutes 15    METs 1.2      Track   Laps 20    Minutes 15    METs 2.09      Prescription Details   Frequency (times per week) 2    Duration Progress to 30 minutes of continuous aerobic without signs/symptoms of physical distress      Intensity   THRR 40-80% of Max Heartrate 85-115    Ratings of Perceived Exertion 11-13    Perceived Dyspnea 0-4      Progression   Progression Continue to progress workloads to maintain intensity without signs/symptoms of physical distress.      Resistance Training   Training Prescription Yes    Weight 4 lb    Reps 10-15             Perform Capillary Blood Glucose checks as needed.  Exercise Prescription Changes:   Exercise Prescription Changes     Row Name 04/11/22 1300             Response to Exercise   Blood Pressure (Admit) 138/60       Blood Pressure (Exercise) 152/54       Blood Pressure (Exit) 142/52       Heart Rate (Admit) 57 bpm  Heart Rate (Exercise) 82 bpm       Heart Rate (Exit) 60 bpm       Oxygen Saturation (Admit) 97 %       Oxygen Saturation (Exercise) 96 %       Rating of Perceived Exertion (Exercise) 11       Perceived Dyspnea (Exercise) 1       Symptoms hip pain       Comments 6MWT Results                Exercise Comments:   Exercise Goals and  Review:   Exercise Goals     Row Name 04/11/22 1334             Exercise Goals   Increase Physical Activity Yes       Intervention Provide advice, education, support and counseling about physical activity/exercise needs.;Develop an individualized exercise prescription for aerobic and resistive training based on initial evaluation findings, risk stratification, comorbidities and participant's personal goals.       Expected Outcomes Short Term: Attend rehab on a regular basis to increase amount of physical activity.;Long Term: Add in home exercise to make exercise part of routine and to increase amount of physical activity.;Long Term: Exercising regularly at least 3-5 days a week.       Increase Strength and Stamina Yes       Intervention Provide advice, education, support and counseling about physical activity/exercise needs.;Develop an individualized exercise prescription for aerobic and resistive training based on initial evaluation findings, risk stratification, comorbidities and participant's personal goals.       Expected Outcomes Short Term: Increase workloads from initial exercise prescription for resistance, speed, and METs.;Short Term: Perform resistance training exercises routinely during rehab and add in resistance training at home;Long Term: Improve cardiorespiratory fitness, muscular endurance and strength as measured by increased METs and functional capacity (6MWT)       Able to understand and use rate of perceived exertion (RPE) scale Yes       Intervention Provide education and explanation on how to use RPE scale       Expected Outcomes Short Term: Able to use RPE daily in rehab to express subjective intensity level;Long Term:  Able to use RPE to guide intensity level when exercising independently       Able to understand and use Dyspnea scale Yes       Intervention Provide education and explanation on how to use Dyspnea scale       Expected Outcomes Short Term: Able to use  Dyspnea scale daily in rehab to express subjective sense of shortness of breath during exertion;Long Term: Able to use Dyspnea scale to guide intensity level when exercising independently       Knowledge and understanding of Target Heart Rate Range (THRR) Yes       Intervention Provide education and explanation of THRR including how the numbers were predicted and where they are located for reference       Expected Outcomes Short Term: Able to use daily as guideline for intensity in rehab;Short Term: Able to state/look up THRR;Long Term: Able to use THRR to govern intensity when exercising independently       Able to check pulse independently Yes       Intervention Provide education and demonstration on how to check pulse in carotid and radial arteries.;Review the importance of being able to check your own pulse for safety during independent exercise       Expected  Outcomes Short Term: Able to explain why pulse checking is important during independent exercise;Long Term: Able to check pulse independently and accurately       Understanding of Exercise Prescription Yes       Intervention Provide education, explanation, and written materials on patient's individual exercise prescription       Expected Outcomes Short Term: Able to explain program exercise prescription;Long Term: Able to explain home exercise prescription to exercise independently                Exercise Goals Re-Evaluation :   Discharge Exercise Prescription (Final Exercise Prescription Changes):  Exercise Prescription Changes - 04/11/22 1300       Response to Exercise   Blood Pressure (Admit) 138/60    Blood Pressure (Exercise) 152/54    Blood Pressure (Exit) 142/52    Heart Rate (Admit) 57 bpm    Heart Rate (Exercise) 82 bpm    Heart Rate (Exit) 60 bpm    Oxygen Saturation (Admit) 97 %    Oxygen Saturation (Exercise) 96 %    Rating of Perceived Exertion (Exercise) 11    Perceived Dyspnea (Exercise) 1    Symptoms hip  pain    Comments 6MWT Results             Nutrition:  Target Goals: Understanding of nutrition guidelines, daily intake of sodium 1500mg , cholesterol 200mg , calories 30% from fat and 7% or less from saturated fats, daily to have 5 or more servings of fruits and vegetables.  Education: All About Nutrition: -Group instruction provided by verbal, written material, interactive activities, discussions, models, and posters to present general guidelines for heart healthy nutrition including fat, fiber, MyPlate, the role of sodium in heart healthy nutrition, utilization of the nutrition label, and utilization of this knowledge for meal planning. Follow up email sent as well. Written material given at graduation. Flowsheet Row Cardiac Rehab from 04/11/2022 in [facility] Cardiac and Pulmonary Rehab  Education need identified 04/11/22       Biometrics:  Pre Biometrics - 04/11/22 1335       Pre Biometrics   Height 5' 10.5" (1.791 m)    Weight 181 lb 12.8 oz (82.5 kg)    Waist Circumference 38 inches    Hip Circumference 40 inches    Waist to Hip Ratio 0.95 %    BMI (Calculated) 25.71    Single Leg Stand 3.4 seconds              Nutrition Therapy Plan and Nutrition Goals:  Nutrition Therapy & Goals - 04/11/22 1108       Intervention Plan   Intervention Prescribe, educate and counsel regarding individualized specific dietary modifications aiming towards targeted core components such as weight, hypertension, lipid management, diabetes, heart failure and other comorbidities.    Expected Outcomes Short Term Goal: Understand basic principles of dietary content, such as calories, fat, sodium, cholesterol and nutrients.;Short Term Goal: A plan has been developed with personal nutrition goals set during dietitian appointment.;Long Term Goal: Adherence to prescribed nutrition plan.             Nutrition Assessments:  MEDIFICTS Score Key: ?70 Need to make dietary changes  40-70 Heart  Healthy Diet ? 40 Therapeutic Level Cholesterol Diet  Flowsheet Row Cardiac Rehab from 04/11/2022 in [facility] Cardiac and Pulmonary Rehab  Picture Your Plate Total Score on Admission 86      Picture Your Plate Scores: [id number] Unhealthy dietary pattern with much room for improvement. 41-50 Dietary pattern  unlikely to meet recommendations for good health and room for improvement. 51-60 More healthful dietary pattern, with some room for improvement.  >60 Healthy dietary pattern, although there may be some specific behaviors that could be improved.    Nutrition Goals Re-Evaluation:   Nutrition Goals Discharge (Final Nutrition Goals Re-Evaluation):   Psychosocial: Target Goals: Acknowledge presence or absence of significant depression and/or stress, maximize coping skills, provide positive support system. Participant is able to verbalize types and ability to use techniques and skills needed for reducing stress and depression.   Education: Stress, Anxiety, and Depression - Group verbal and visual presentation to define topics covered.  Reviews how body is impacted by stress, anxiety, and depression.  Also discusses healthy ways to reduce stress and to treat/manage anxiety and depression.  Written material given at graduation. Flowsheet Row Cardiac Rehab from 09/13/2021 in [facility] Cardiac and Pulmonary Rehab  Date 09/13/21  Educator [facility]  Instruction Review Code 1- [employer] Understanding       Education: Sleep Hygiene -Provides group verbal and written instruction about how sleep can affect your health.  Define sleep hygiene, discuss sleep cycles and impact of sleep habits. Review good sleep hygiene tips.    Initial Review & Psychosocial Screening:  Initial Psych Review & Screening - 04/08/22 0937       Initial Review   Current issues with None Identified      Family Dynamics   Good Support System? Yes    Comments Avant states that he has no issues with depression or anxiets and does not  take anything for his mood. He can look to his wife, son, daughter and grandaughter for support.      Barriers   Psychosocial barriers to participate in program There are no identifiable barriers or psychosocial needs.;The patient should benefit from training in stress management and relaxation.      Screening Interventions   Interventions Encouraged to exercise;To provide support and resources with identified psychosocial needs;Provide feedback about the scores to participant    Expected Outcomes Long Term Goal: Stressors or current issues are controlled or eliminated.;Short Term goal: Utilizing psychosocial counselor, staff and physician to assist with identification of specific Stressors or current issues interfering with healing process. Setting desired goal for each stressor or current issue identified.;Short Term goal: Identification and review with participant of any Quality of Life or Depression concerns found by scoring the questionnaire.;Long Term goal: The participant improves quality of Life and PHQ9 Scores as seen by post scores and/or verbalization of changes             Quality of Life Scores:   Quality of Life - 04/11/22 1055       Quality of Life   Select Quality of Life      Quality of Life Scores   Health/Function Pre 26.57 %    Socioeconomic Pre 25.63 %    Psych/Spiritual Pre 29.64 %    Family Pre 28.8 %    GLOBAL Pre 27.29 %            Scores of 19 and below usually indicate a poorer quality of life in these areas.  A difference of  2-3 points is a clinically meaningful difference.  A difference of 2-3 points in the total score of the Quality of Life Index has been associated with significant improvement in overall quality of life, self-image, physical symptoms, and general health in studies assessing change in quality of life.  PHQ-9: Review Flowsheet  More data  exists      04/11/2022 09/11/2021 04/03/2021 03/16/2021 02/01/2021  Depression screen PHQ 2/9   Decreased Interest 0 0 0 0 0  Down, Depressed, Hopeless 0 0 0 0 0  PHQ - 2 Score 0 0 0 0 0  Altered sleeping 1 0 1 - -  Tired, decreased energy 1 1 1  - -  Change in appetite 1 0 0 - -  Feeling bad or failure about yourself  0 0 0 - -  Trouble concentrating 1 0 0 - -  Moving slowly or fidgety/restless 0 0 0 - -  Suicidal thoughts 0 0 0 - -  PHQ-9 Score 4 1 2  - -  Difficult doing work/chores Somewhat difficult Not difficult at all Not difficult at all - -   Interpretation of Total Score  Total Score Depression Severity:  1-4 = Minimal depression, 5-9 = Mild depression, 10-14 = Moderate depression, 15-19 = Moderately severe depression, 20-27 = Severe depression   Psychosocial Evaluation and Intervention:  Psychosocial Evaluation - 04/08/22 0939       Psychosocial Evaluation & Interventions   Interventions Encouraged to exercise with the program and follow exercise prescription;Relaxation education;Stress management education    Comments Alontae states that he has no issues with depression or anxiets and does not take anything for his mood. He can look to his wife, son, daughter and grandaughter for support.    Expected Outcomes Short: Attend HeartTrack stress management education to decrease stress. Long: Maintain exercise Post HeartTrack to keep stress at a minimum.    Continue Psychosocial Services  Follow up required by staff             Psychosocial Re-Evaluation:   Psychosocial Discharge (Final Psychosocial Re-Evaluation):   Vocational Rehabilitation: Provide vocational rehab assistance to qualifying candidates.   Vocational Rehab Evaluation & Intervention:   Education: Education Goals: Education classes will be provided on a variety of topics geared toward better understanding of heart health and risk factor modification. Participant will state understanding/return demonstration of topics presented as noted by education test scores.  Learning Barriers/Preferences:   Learning Barriers/Preferences - 04/08/22 0935       Learning Barriers/Preferences   Learning Barriers None    Learning Preferences None             General Cardiac Education Topics:  AED/CPR: - Group verbal and written instruction with the use of models to demonstrate the basic use of the AED with the basic ABC's of resuscitation.   Anatomy and Cardiac Procedures: - Group verbal and visual presentation and models provide information about basic cardiac anatomy and function. Reviews the testing methods done to diagnose heart disease and the outcomes of the test results. Describes the treatment choices: Medical Management, Angioplasty, or Coronary Bypass Surgery for treating various heart conditions including Myocardial Infarction, Angina, Valve Disease, and Cardiac Arrhythmias.  Written material given at graduation. Flowsheet Row Cardiac Rehab from 04/11/2022 in [facility] Cardiac and Pulmonary Rehab  Education need identified 04/11/22       Medication Safety: - Group verbal and visual instruction to review commonly prescribed medications for heart and lung disease. Reviews the medication, class of the drug, and side effects. Includes the steps to properly store meds and maintain the prescription regimen.  Written material given at graduation.   Intimacy: - Group verbal instruction through game format to discuss how heart and lung disease can affect sexual intimacy. Written material given at graduation..   Know Your Numbers and Heart Failure: -  Group verbal and visual instruction to discuss disease risk factors for cardiac and pulmonary disease and treatment options.  Reviews associated critical values for Overweight/Obesity, Hypertension, Cholesterol, and Diabetes.  Discusses basics of heart failure: signs/symptoms and treatments.  Introduces Heart Failure Zone chart for action plan for heart failure.  Written material given at graduation. Flowsheet Row Cardiac Rehab from 09/13/2021 in  [facility] Cardiac and Pulmonary Rehab  Date 08/30/21  Educator SB  Instruction Review Code 1- Verbalizes Understanding       Infection Prevention: - Provides verbal and written material to individual with discussion of infection control including proper hand washing and proper equipment cleaning during exercise session. Flowsheet Row Cardiac Rehab from 04/11/2022 in [facility] Cardiac and Pulmonary Rehab  Date 04/08/22  Educator jh  Instruction Review Code 1- Verbalizes Understanding       Falls Prevention: - Provides verbal and written material to individual with discussion of falls prevention and safety. Flowsheet Row Cardiac Rehab from 04/11/2022 in [facility] Cardiac and Pulmonary Rehab  Date 04/08/22  Educator jh  Instruction Review Code 1- Verbalizes Understanding       Other: -Provides group and verbal instruction on various topics (see comments)   Knowledge Questionnaire Score:  Knowledge Questionnaire Score - 04/11/22 1054       Knowledge Questionnaire Score   Pre Score 22/26             Core Components/Risk Factors/Patient Goals at Admission:  Personal Goals and Risk Factors at Admission - 04/11/22 1110       Core Components/Risk Factors/Patient Goals on Admission    Weight Management Yes;Weight Maintenance    Admit Weight 181 lb 12.8 oz (82.5 kg)    Goal Weight: Short Term 181 lb 12.8 oz (82.5 kg)    Goal Weight: Long Term 181 lb 12.8 oz (82.5 kg)    Expected Outcomes Short Term: Continue to assess and modify interventions until short term weight is achieved;Long Term: Adherence to nutrition and physical activity/exercise program aimed toward attainment of established weight goal;Weight Maintenance: Understanding of the daily nutrition guidelines, which includes 25-35% calories from fat, 7% or less cal from saturated fats, less than 200mg  cholesterol, less than 1.5gm of sodium, & 5 or more servings of fruits and vegetables daily;Understanding recommendations for meals to  include 15-35% energy as protein, 25-35% energy from fat, 35-60% energy from carbohydrates, less than 200mg  of dietary cholesterol, 20-35 gm of total fiber daily;Understanding of distribution of calorie intake throughout the day with the consumption of 4-5 meals/snacks    Heart Failure Yes    Intervention Provide a combined exercise and nutrition program that is supplemented with education, support and counseling about heart failure. Directed toward relieving symptoms such as shortness of breath, decreased exercise tolerance, and extremity edema.    Expected Outcomes Improve functional capacity of life;Short term: Attendance in program 2-3 days a week with increased exercise capacity. Reported lower sodium intake. Reported increased fruit and vegetable intake. Reports medication compliance.;Long term: Adoption of self-care skills and reduction of barriers for early signs and symptoms recognition and intervention leading to self-care maintenance.;Short term: Daily weights obtained and reported for increase. Utilizing diuretic protocols set by physician.    Hypertension Yes    Intervention Provide education on lifestyle modifcations including regular physical activity/exercise, weight management, moderate sodium restriction and increased consumption of fresh fruit, vegetables, and low fat dairy, alcohol moderation, and smoking cessation.;Monitor prescription use compliance.    Expected Outcomes Short Term: Continued assessment and intervention until BP  is < 140/70mm HG in hypertensive participants. < 130/53mm HG in hypertensive participants with diabetes, heart failure or chronic kidney disease.;Long Term: Maintenance of blood pressure at goal levels.    Lipids Yes    Intervention Provide education and support for participant on nutrition & aerobic/resistive exercise along with prescribed medications to achieve LDL 70mg , HDL >40mg .    Expected Outcomes Short Term: Participant states understanding of desired  cholesterol values and is compliant with medications prescribed. Participant is following exercise prescription and nutrition guidelines.;Long Term: Cholesterol controlled with medications as prescribed, with individualized exercise RX and with personalized nutrition plan. Value goals: LDL < 70mg , HDL > 40 mg.             Education:Diabetes - Individual verbal and written instruction to review signs/symptoms of diabetes, desired ranges of glucose level fasting, after meals and with exercise. Acknowledge that pre and post exercise glucose checks will be done for 3 sessions at entry of program.   Core Components/Risk Factors/Patient Goals Review:    Core Components/Risk Factors/Patient Goals at Discharge (Final Review):    ITP Comments:  ITP Comments     Row Name 04/08/22 0937 04/11/22 1052         ITP Comments In person Visit completed. Patient informed on EP and RD appointment and 6 Minute walk test. Patient also informed of patient health questionnaires on My Chart. Patient Verbalizes understanding. Visit diagnosis can be found in [facility] 03/25/[facility]22. Completed 6MWT and gym orientation. Initial ITP created and sent for review to Dr. Emily Filbert, Medical Director.               Comments: Initial ITP

## 2022-04-11 NOTE — Patient Instructions (Signed)
Patient Instructions  Patient Details  Name: Shawn Andrews MRN: [id number] Date of Birth: September 06, 1928 Referring Provider:  Yolonda Kida, MD  Below are your personal goals for exercise, nutrition, and risk factors. Our goal is to help you stay on track towards obtaining and maintaining these goals. We will be discussing your progress on these goals with you throughout the program.  Initial Exercise Prescription:  Initial Exercise Prescription - 04/11/22 1300       Date of Initial Exercise RX and Referring Provider   Date 04/11/22    Referring Provider Dr. Lujean Amel, MD      Oxygen   Maintain Oxygen Saturation 88% or higher      Recumbant Bike   Level 2    Watts 15    Minutes 15    METs 1.2      NuStep   Level 2    Minutes 15    METs 1.2      Track   Laps 20    Minutes 15    METs 2.09      Prescription Details   Frequency (times per week) 2    Duration Progress to 30 minutes of continuous aerobic without signs/symptoms of physical distress      Intensity   THRR 40-80% of Max Heartrate 85-115    Ratings of Perceived Exertion 11-13    Perceived Dyspnea 0-4      Progression   Progression Continue to progress workloads to maintain intensity without signs/symptoms of physical distress.      Resistance Training   Training Prescription Yes    Weight 4 lb    Reps 10-15             Exercise Goals: Frequency: Be able to perform aerobic exercise two to three times per week in program working toward 2-5 days per week of home exercise.  Intensity: Work with a perceived exertion of 11 (fairly light) - 15 (hard) while following your exercise prescription.  We will make changes to your prescription with you as you progress through the program.   Duration: Be able to do 30 to 45 minutes of continuous aerobic exercise in addition to a 5 minute warm-up and a 5 minute cool-down routine.   Nutrition Goals: Your personal nutrition goals will be established when  you do your nutrition analysis with the dietician.  The following are general nutrition guidelines to follow: Cholesterol < 200mg /day Sodium < 1500mg /day Fiber: Men over 50 yrs - 30 grams per day  Personal Goals:  Personal Goals and Risk Factors at Admission - 04/11/22 1110       Core Components/Risk Factors/Patient Goals on Admission    Weight Management Yes;Weight Maintenance    Admit Weight 181 lb 12.8 oz (82.5 kg)    Goal Weight: Short Term 181 lb 12.8 oz (82.5 kg)    Goal Weight: Long Term 181 lb 12.8 oz (82.5 kg)    Expected Outcomes Short Term: Continue to assess and modify interventions until short term weight is achieved;Long Term: Adherence to nutrition and physical activity/exercise program aimed toward attainment of established weight goal;Weight Maintenance: Understanding of the daily nutrition guidelines, which includes 25-35% calories from fat, 7% or less cal from saturated fats, less than 200mg  cholesterol, less than 1.5gm of sodium, & 5 or more servings of fruits and vegetables daily;Understanding recommendations for meals to include 15-35% energy as protein, 25-35% energy from fat, 35-60% energy from carbohydrates, less than 200mg  of dietary cholesterol, 20-35 gm of  total fiber daily;Understanding of distribution of calorie intake throughout the day with the consumption of 4-5 meals/snacks    Heart Failure Yes    Intervention Provide a combined exercise and nutrition program that is supplemented with education, support and counseling about heart failure. Directed toward relieving symptoms such as shortness of breath, decreased exercise tolerance, and extremity edema.    Expected Outcomes Improve functional capacity of life;Short term: Attendance in program 2-3 days a week with increased exercise capacity. Reported lower sodium intake. Reported increased fruit and vegetable intake. Reports medication compliance.;Long term: Adoption of self-care skills and reduction of barriers for  early signs and symptoms recognition and intervention leading to self-care maintenance.;Short term: Daily weights obtained and reported for increase. Utilizing diuretic protocols set by physician.    Hypertension Yes    Intervention Provide education on lifestyle modifcations including regular physical activity/exercise, weight management, moderate sodium restriction and increased consumption of fresh fruit, vegetables, and low fat dairy, alcohol moderation, and smoking cessation.;Monitor prescription use compliance.    Expected Outcomes Short Term: Continued assessment and intervention until BP is < 140/37mm HG in hypertensive participants. < 130/44mm HG in hypertensive participants with diabetes, heart failure or chronic kidney disease.;Long Term: Maintenance of blood pressure at goal levels.    Lipids Yes    Intervention Provide education and support for participant on nutrition & aerobic/resistive exercise along with prescribed medications to achieve LDL 70mg , HDL >40mg .    Expected Outcomes Short Term: Participant states understanding of desired cholesterol values and is compliant with medications prescribed. Participant is following exercise prescription and nutrition guidelines.;Long Term: Cholesterol controlled with medications as prescribed, with individualized exercise RX and with personalized nutrition plan. Value goals: LDL < 70mg , HDL > 40 mg.            Exercise Goals and Review:  Exercise Goals     Row Name 04/11/22 1334             Exercise Goals   Increase Physical Activity Yes       Intervention Provide advice, education, support and counseling about physical activity/exercise needs.;Develop an individualized exercise prescription for aerobic and resistive training based on initial evaluation findings, risk stratification, comorbidities and participant's personal goals.       Expected Outcomes Short Term: Attend rehab on a regular basis to increase amount of physical  activity.;Long Term: Add in home exercise to make exercise part of routine and to increase amount of physical activity.;Long Term: Exercising regularly at least 3-5 days a week.       Increase Strength and Stamina Yes       Intervention Provide advice, education, support and counseling about physical activity/exercise needs.;Develop an individualized exercise prescription for aerobic and resistive training based on initial evaluation findings, risk stratification, comorbidities and participant's personal goals.       Expected Outcomes Short Term: Increase workloads from initial exercise prescription for resistance, speed, and METs.;Short Term: Perform resistance training exercises routinely during rehab and add in resistance training at home;Long Term: Improve cardiorespiratory fitness, muscular endurance and strength as measured by increased METs and functional capacity (6MWT)       Able to understand and use rate of perceived exertion (RPE) scale Yes       Intervention Provide education and explanation on how to use RPE scale       Expected Outcomes Short Term: Able to use RPE daily in rehab to express subjective intensity level;Long Term:  Able to use RPE to guide intensity  level when exercising independently       Able to understand and use Dyspnea scale Yes       Intervention Provide education and explanation on how to use Dyspnea scale       Expected Outcomes Short Term: Able to use Dyspnea scale daily in rehab to express subjective sense of shortness of breath during exertion;Long Term: Able to use Dyspnea scale to guide intensity level when exercising independently       Knowledge and understanding of Target Heart Rate Range (THRR) Yes       Intervention Provide education and explanation of THRR including how the numbers were predicted and where they are located for reference       Expected Outcomes Short Term: Able to use daily as guideline for intensity in rehab;Short Term: Able to state/look  up THRR;Long Term: Able to use THRR to govern intensity when exercising independently       Able to check pulse independently Yes       Intervention Provide education and demonstration on how to check pulse in carotid and radial arteries.;Review the importance of being able to check your own pulse for safety during independent exercise       Expected Outcomes Short Term: Able to explain why pulse checking is important during independent exercise;Long Term: Able to check pulse independently and accurately       Understanding of Exercise Prescription Yes       Intervention Provide education, explanation, and written materials on patient's individual exercise prescription       Expected Outcomes Short Term: Able to explain program exercise prescription;Long Term: Able to explain home exercise prescription to exercise independently

## 2022-04-12 ENCOUNTER — Ambulatory Visit: Payer: Medicare Other | Admitting: Urology

## 2022-04-12 VITALS — BP 109/56 | HR 65 | Ht 70.0 in | Wt 181.0 lb

## 2022-04-12 DIAGNOSIS — Z9359 Other cystostomy status: Secondary | ICD-10-CM

## 2022-04-12 DIAGNOSIS — R339 Retention of urine, unspecified: Secondary | ICD-10-CM | POA: Diagnosis not present

## 2022-04-12 NOTE — Patient Instructions (Signed)
Continue the finasteride 5 mg daily

## 2022-04-15 ENCOUNTER — Encounter: Payer: Medicare Other | Admitting: *Deleted

## 2022-04-15 DIAGNOSIS — Z48812 Encounter for surgical aftercare following surgery on the circulatory system: Secondary | ICD-10-CM | POA: Diagnosis not present

## 2022-04-15 DIAGNOSIS — Z955 Presence of coronary angioplasty implant and graft: Secondary | ICD-10-CM

## 2022-04-15 DIAGNOSIS — I214 Non-ST elevation (NSTEMI) myocardial infarction: Secondary | ICD-10-CM

## 2022-04-15 NOTE — Progress Notes (Signed)
Daily Session Note  Patient Details  Name: Shawn Andrews MRN: [id number] Date of Birth: Jan 24, 1928 Referring Provider:   Flowsheet Row Cardiac Rehab from 04/11/2022 in [facility] Cardiac and Pulmonary Rehab  Referring Provider Dr. Lujean Amel, MD       Encounter Date: 04/15/2022  Check In:  Session Check In - 04/15/22 0928       Check-In   Supervising physician immediately available to respond to emergencies See telemetry face sheet for immediately available ER MD    Location [facility]-Cardiac & Pulmonary Rehab    Staff Present Darlyne Russian, RN, Doyce Para, BS, ACSM CEP, Exercise Physiologist;Noah Tickle, BS, Exercise Physiologist    Virtual Visit No    Medication changes reported     No    Fall or balance concerns reported    No    Warm-up and Cool-down Performed on first and last piece of equipment    Resistance Training Performed Yes    VAD Patient? No    PAD/SET Patient? No      Pain Assessment   Currently in Pain? No/denies                Social History   Tobacco Use  Smoking Status Former   Packs/day: 0.00   Years: 0.00   Additional pack years: 0.00   Total pack years: 0.00   Types: Pipe, Cigarettes   Quit date: 12/26/2000   Years since quitting: 21.3   Passive exposure: Past  Smokeless Tobacco Current   Types: Chew  Tobacco Comments   quit over 30 years ago    Goals Met:  Independence with exercise equipment Exercise tolerated well No report of concerns or symptoms today Strength training completed today  Goals Unmet:  Not Applicable  Comments: First full day of exercise!  Patient was oriented to gym and equipment including functions, settings, policies, and procedures.  Patient's individual exercise prescription and treatment plan were reviewed.  All starting workloads were established based on the results of the 6 minute walk test done at initial orientation visit.  The plan for exercise progression was also introduced and progression will be  customized based on patient's performance and goals.    Dr. Emily Filbert is Medical Director for [place].  Dr. Ottie Glazier is Medical Director for [facility] Pulmonary Rehabilitation.

## 2022-04-17 ENCOUNTER — Encounter: Payer: Medicare Other | Admitting: *Deleted

## 2022-04-17 DIAGNOSIS — Z955 Presence of coronary angioplasty implant and graft: Secondary | ICD-10-CM

## 2022-04-17 DIAGNOSIS — I214 Non-ST elevation (NSTEMI) myocardial infarction: Secondary | ICD-10-CM

## 2022-04-17 NOTE — Progress Notes (Signed)
Daily Session Note  Patient Details  Name: Shawn Andrews MRN: [id number] Date of Birth: 18-Nov-1928 Referring Provider:   Flowsheet Row Cardiac Rehab from 04/11/2022 in [facility] Cardiac and Pulmonary Rehab  Referring Provider Dr. Lujean Amel, MD       Encounter Date: 04/17/2022  Check In:  Session Check In - 04/17/22 0925       Check-In   Supervising physician immediately available to respond to emergencies See telemetry face sheet for immediately available ER MD    Location [facility]-Cardiac & Pulmonary Rehab    Staff Present Justin Mend, RCP,RRT,BSRT;Noah Tickle, BS, Exercise Physiologist;Megan Tamala Julian, RN, ADN;Amantha Sklar RN, BSN;Melissa Caiola MS, RDN, LDN    Virtual Visit No    Medication changes reported     No    Fall or balance concerns reported    No    Tobacco Cessation No Change    Warm-up and Cool-down Performed on first and last piece of equipment    Resistance Training Performed Yes    VAD Patient? No    PAD/SET Patient? No      Pain Assessment   Currently in Pain? No/denies                Social History   Tobacco Use  Smoking Status Former   Packs/day: 0.00   Years: 0.00   Additional pack years: 0.00   Total pack years: 0.00   Types: Pipe, Cigarettes   Quit date: 12/26/2000   Years since quitting: 21.3   Passive exposure: Past  Smokeless Tobacco Current   Types: Chew  Tobacco Comments   quit over 30 years ago    Goals Met:  Independence with exercise equipment Exercise tolerated well No report of concerns or symptoms today Strength training completed today  Goals Unmet:  Not Applicable  Comments: Pt able to follow exercise prescription today without complaint.  Will continue to monitor for progression.    Dr. Emily Filbert is Medical Director for [place].  Dr. Ottie Glazier is Medical Director for [facility] Pulmonary Rehabilitation.

## 2022-04-22 ENCOUNTER — Encounter: Payer: Medicare Other | Attending: Internal Medicine | Admitting: *Deleted

## 2022-04-22 DIAGNOSIS — I214 Non-ST elevation (NSTEMI) myocardial infarction: Secondary | ICD-10-CM

## 2022-04-22 DIAGNOSIS — Z955 Presence of coronary angioplasty implant and graft: Secondary | ICD-10-CM | POA: Insufficient documentation

## 2022-04-22 DIAGNOSIS — I252 Old myocardial infarction: Secondary | ICD-10-CM | POA: Diagnosis not present

## 2022-04-22 DIAGNOSIS — Z48812 Encounter for surgical aftercare following surgery on the circulatory system: Secondary | ICD-10-CM | POA: Insufficient documentation

## 2022-04-22 NOTE — Progress Notes (Signed)
Daily Session Note  Patient Details  Name: Shawn Andrews MRN: [id number] Date of Birth: [birth year] Referring Provider:   Flowsheet Row Cardiac Rehab from 04/11/2022 in [facility] Cardiac and Pulmonary Rehab  Referring Provider Dr. Lujean Amel, MD       Encounter Date: 04/22/2022  Check In:  Session Check In - 04/22/22 0934       Check-In   Supervising physician immediately available to respond to emergencies See telemetry face sheet for immediately available ER MD    Location [facility]-Cardiac & Pulmonary Rehab    Staff Present Darlyne Russian, RN, Doyce Para, BS, ACSM CEP, Exercise Physiologist;Joseph Tessie Fass, [place]    Virtual Visit No    Medication changes reported     No    Fall or balance concerns reported    No    Warm-up and Cool-down Performed on first and last piece of equipment    Resistance Training Performed Yes    VAD Patient? No    PAD/SET Patient? No      Pain Assessment   Currently in Pain? No/denies                Social History   Tobacco Use  Smoking Status Former   Packs/day: 0.00   Years: 0.00   Additional pack years: 0.00   Total pack years: 0.00   Types: Pipe, Cigarettes   Quit date: 12/26/2000   Years since quitting: 21.3   Passive exposure: Past  Smokeless Tobacco Current   Types: Chew  Tobacco Comments   quit over 30 years ago    Goals Met:  Independence with exercise equipment Exercise tolerated well No report of concerns or symptoms today Strength training completed today  Goals Unmet:  Not Applicable  Comments: Pt able to follow exercise prescription today without complaint.  Will continue to monitor for progression.    Dr. Emily Filbert is Medical Director for [place].  Dr. Ottie Glazier is Medical Director for [facility] Pulmonary Rehabilitation.

## 2022-04-26 ENCOUNTER — Ambulatory Visit: Payer: Medicare Other | Admitting: Urology

## 2022-04-26 DIAGNOSIS — I214 Non-ST elevation (NSTEMI) myocardial infarction: Secondary | ICD-10-CM

## 2022-04-26 DIAGNOSIS — Z955 Presence of coronary angioplasty implant and graft: Secondary | ICD-10-CM | POA: Diagnosis not present

## 2022-04-26 NOTE — Progress Notes (Signed)
Daily Session Note  Patient Details  Name: BROOX SERA MRN: [id number] Date of Birth: 23-Sep-1928 Referring Provider:   Flowsheet Row Cardiac Rehab from 04/11/2022 in [facility] Cardiac and Pulmonary Rehab  Referring Provider Dr. Dorothyann Peng, MD       Encounter Date: 04/26/2022  Check In:  Session Check In - 04/26/22 0917       Check-In   Supervising physician immediately available to respond to emergencies See telemetry face sheet for immediately available ER MD    Location [facility]-Cardiac & Pulmonary Rehab    Staff Present Elige Ko, RCP,RRT,BSRT;Nela Bascom, RN,BC,MSN;Noah Tickle, BS, Exercise Physiologist    Virtual Visit No    Medication changes reported     No    Fall or balance concerns reported    No    Tobacco Cessation No Change    Warm-up and Cool-down Performed on first and last piece of equipment    Resistance Training Performed Yes    VAD Patient? No    PAD/SET Patient? No      Pain Assessment   Currently in Pain? No/denies    Multiple Pain Sites No                Social History   Tobacco Use  Smoking Status Former   Packs/day: 0.00   Years: 0.00   Additional pack years: 0.00   Total pack years: 0.00   Types: Pipe, Cigarettes   Quit date: 12/26/2000   Years since quitting: 21.3   Passive exposure: Past  Smokeless Tobacco Current   Types: Chew  Tobacco Comments   quit over 30 years ago    Goals Met:  Independence with exercise equipment Exercise tolerated well No report of concerns or symptoms today  Goals Unmet:  Not Applicable  Comments: Pt able to follow exercise prescription today without complaint.  Will continue to monitor for progression.    Dr. Bethann Punches is Medical Director for [facility] Cardiac Rehabilitation.  Dr. Vida Rigger is Medical Director for [facility] Pulmonary Rehabilitation.

## 2022-04-29 ENCOUNTER — Encounter: Payer: Medicare Other | Admitting: *Deleted

## 2022-04-29 DIAGNOSIS — I214 Non-ST elevation (NSTEMI) myocardial infarction: Secondary | ICD-10-CM

## 2022-04-29 DIAGNOSIS — Z955 Presence of coronary angioplasty implant and graft: Secondary | ICD-10-CM | POA: Diagnosis not present

## 2022-04-29 NOTE — Progress Notes (Signed)
Daily Session Note  Patient Details  Name: Shawn Andrews MRN: [id number] Date of Birth: 1929/01/18 Referring Provider:   Flowsheet Row Cardiac Rehab from 04/11/2022 in [facility] Cardiac and Pulmonary Rehab  Referring Provider Dr. Dorothyann Peng, MD       Encounter Date: 04/29/2022  Check In:  Session Check In - 04/29/22 0927       Check-In   Supervising physician immediately available to respond to emergencies See telemetry face sheet for immediately available ER MD    Location [facility]-Cardiac & Pulmonary Rehab    Staff Present Lanny Hurst, RN, Franki Monte, BS, ACSM CEP, Exercise Physiologist;Noah Tickle, BS, Exercise Physiologist    Virtual Visit No    Medication changes reported     No    Fall or balance concerns reported    No    Warm-up and Cool-down Performed on first and last piece of equipment    Resistance Training Performed Yes    VAD Patient? No    PAD/SET Patient? No      Pain Assessment   Currently in Pain? No/denies                Social History   Tobacco Use  Smoking Status Former   Packs/day: 0.00   Years: 0.00   Additional pack years: 0.00   Total pack years: 0.00   Types: Pipe, Cigarettes   Quit date: 12/26/2000   Years since quitting: 21.3   Passive exposure: Past  Smokeless Tobacco Current   Types: Chew  Tobacco Comments   quit over 30 years ago    Goals Met:  Independence with exercise equipment Exercise tolerated well No report of concerns or symptoms today Strength training completed today  Goals Unmet:  Not Applicable  Comments: Pt able to follow exercise prescription today without complaint.  Will continue to monitor for progression.    Dr. Bethann Punches is Medical Director for [facility] Cardiac Rehabilitation.  Dr. Vida Rigger is Medical Director for [facility] Pulmonary Rehabilitation.

## 2022-05-01 ENCOUNTER — Encounter: Payer: Medicare Other | Admitting: *Deleted

## 2022-05-01 DIAGNOSIS — I214 Non-ST elevation (NSTEMI) myocardial infarction: Secondary | ICD-10-CM

## 2022-05-01 DIAGNOSIS — Z955 Presence of coronary angioplasty implant and graft: Secondary | ICD-10-CM | POA: Diagnosis not present

## 2022-05-01 NOTE — Progress Notes (Signed)
Daily Session Note  Patient Details  Name: CLAUDY SOTO MRN: [id number] Date of Birth: February 19, 1928 Referring Provider:   Flowsheet Row Cardiac Rehab from 04/11/2022 in [facility] Cardiac and Pulmonary Rehab  Referring Provider Dr. Dorothyann Peng, MD       Encounter Date: 05/01/2022  Check In:  Session Check In - 05/01/22 0930       Check-In   Supervising physician immediately available to respond to emergencies See telemetry face sheet for immediately available ER MD    Location [facility]-Cardiac & Pulmonary Rehab    Staff Present Lanny Hurst, RN, ADN;Noah Tickle, BS, Exercise Physiologist;Jessica Juanetta Gosling, MA, RCEP, CCRP, CCET    Virtual Visit No    Medication changes reported     No    Fall or balance concerns reported    No    Warm-up and Cool-down Performed on first and last piece of equipment    Resistance Training Performed Yes    VAD Patient? No    PAD/SET Patient? No      Pain Assessment   Currently in Pain? No/denies                Social History   Tobacco Use  Smoking Status Former   Packs/day: 0.00   Years: 0.00   Additional pack years: 0.00   Total pack years: 0.00   Types: Pipe, Cigarettes   Quit date: 12/26/2000   Years since quitting: 21.3   Passive exposure: Past  Smokeless Tobacco Current   Types: Chew  Tobacco Comments   quit over 30 years ago    Goals Met:  Independence with exercise equipment Exercise tolerated well No report of concerns or symptoms today Strength training completed today  Goals Unmet:  Not Applicable  Comments: Pt able to follow exercise prescription today without complaint.  Will continue to monitor for progression. Reviewed home exercise with pt today.  Pt plans to walk at home for exercise.  Reviewed THR, pulse, RPE, sign and symptoms, pulse oximetery and when to call 911 or MD.  Also discussed weather considerations and indoor options.  Pt voiced understanding.    Dr. Bethann Punches is Medical Director for [facility]  Cardiac Rehabilitation.  Dr. Vida Rigger is Medical Director for [facility] Pulmonary Rehabilitation.

## 2022-05-03 ENCOUNTER — Encounter: Payer: Medicare Other | Admitting: *Deleted

## 2022-05-03 DIAGNOSIS — Z955 Presence of coronary angioplasty implant and graft: Secondary | ICD-10-CM

## 2022-05-03 DIAGNOSIS — I214 Non-ST elevation (NSTEMI) myocardial infarction: Secondary | ICD-10-CM

## 2022-05-03 NOTE — Progress Notes (Signed)
Daily Session Note  Patient Details  Name: SAVIOR BASNIGHT MRN: [id number] Date of Birth: [birth year] Referring Provider:   Flowsheet Row Cardiac Rehab from 04/11/2022 in [facility] Cardiac and Pulmonary Rehab  Referring Provider Dr. Dorothyann Peng, MD       Encounter Date: 05/03/2022  Check In:  Session Check In - 05/03/22 0925       Check-In   Supervising physician immediately available to respond to emergencies See telemetry face sheet for immediately available ER MD    Location [facility]-Cardiac & Pulmonary Rehab    Staff Present Elige Ko, RCP,RRT,BSRT;Harlene Ramus, RN, BSN;Arlee Santosuosso, MA, RCEP, CCRP, CCET    Virtual Visit No    Medication changes reported     No    Fall or balance concerns reported    No    Tobacco Cessation No Change    Warm-up and Cool-down Performed on first and last piece of equipment    Resistance Training Performed Yes    VAD Patient? No    PAD/SET Patient? No      Pain Assessment   Currently in Pain? No/denies                Social History   Tobacco Use  Smoking Status Former   Packs/day: 0.00   Years: 0.00   Additional pack years: 0.00   Total pack years: 0.00   Types: Pipe, Cigarettes   Quit date: 12/26/2000   Years since quitting: 21.3   Passive exposure: Past  Smokeless Tobacco Current   Types: Chew  Tobacco Comments   quit over 30 years ago    Goals Met:  Independence with exercise equipment Exercise tolerated well No report of concerns or symptoms today Strength training completed today  Goals Unmet:  Not Applicable  Comments: Pt able to follow exercise prescription today without complaint.  Will continue to monitor for progression.   Dr. Bethann Punches is Medical Director for [facility] Cardiac Rehabilitation.  Dr. Vida Rigger is Medical Director for [facility] Pulmonary Rehabilitation.

## 2022-05-06 ENCOUNTER — Encounter: Payer: Medicare Other | Admitting: *Deleted

## 2022-05-06 DIAGNOSIS — I214 Non-ST elevation (NSTEMI) myocardial infarction: Secondary | ICD-10-CM

## 2022-05-06 DIAGNOSIS — Z955 Presence of coronary angioplasty implant and graft: Secondary | ICD-10-CM

## 2022-05-06 NOTE — Progress Notes (Signed)
Daily Session Note  Patient Details  Name: Shawn Andrews MRN: [id number] Date of Birth: Aug 04, 1928 Referring Provider:   Flowsheet Row Cardiac Rehab from 04/11/2022 in [facility] Cardiac and Pulmonary Rehab  Referring Provider Dr. Dorothyann Peng, MD       Encounter Date: 05/06/2022  Check In:  Session Check In - 05/06/22 0951       Check-In   Supervising physician immediately available to respond to emergencies See telemetry face sheet for immediately available ER MD    Location [facility]-Cardiac & Pulmonary Rehab    Staff Present Lanny Hurst, RN, Franki Monte, BS, ACSM CEP, Exercise Physiologist;Noah Tickle, BS, Exercise Physiologist    Virtual Visit No    Medication changes reported     No    Fall or balance concerns reported    No    Warm-up and Cool-down Performed on first and last piece of equipment    Resistance Training Performed Yes    VAD Patient? No    PAD/SET Patient? No      Pain Assessment   Currently in Pain? No/denies                Social History   Tobacco Use  Smoking Status Former   Packs/day: 0.00   Years: 0.00   Additional pack years: 0.00   Total pack years: 0.00   Types: Pipe, Cigarettes   Quit date: 12/26/2000   Years since quitting: 21.3   Passive exposure: Past  Smokeless Tobacco Current   Types: Chew  Tobacco Comments   quit over 30 years ago    Goals Met:  Independence with exercise equipment Exercise tolerated well No report of concerns or symptoms today Strength training completed today  Goals Unmet:  Not Applicable  Comments: Pt able to follow exercise prescription today without complaint.  Will continue to monitor for progression.    Dr. Bethann Punches is Medical Director for [facility] Cardiac Rehabilitation.  Dr. Vida Rigger is Medical Director for [facility] Pulmonary Rehabilitation.

## 2022-05-08 ENCOUNTER — Encounter: Payer: Medicare Other | Admitting: *Deleted

## 2022-05-08 ENCOUNTER — Encounter: Payer: Self-pay | Admitting: *Deleted

## 2022-05-08 DIAGNOSIS — I214 Non-ST elevation (NSTEMI) myocardial infarction: Secondary | ICD-10-CM

## 2022-05-08 DIAGNOSIS — Z955 Presence of coronary angioplasty implant and graft: Secondary | ICD-10-CM

## 2022-05-08 NOTE — Progress Notes (Signed)
Cardiac Individual Treatment Plan  Patient Details  Name: SRIJAN GIVAN MRN: [id number] Date of Birth: [birth year] Referring Provider:   Flowsheet Row Cardiac Rehab from 04/11/2022 in [facility] Cardiac and Pulmonary Rehab  Referring Provider Dr. Dorothyann Peng, MD       Initial Encounter Date:  Flowsheet Row Cardiac Rehab from 04/11/2022 in [facility] Cardiac and Pulmonary Rehab  Date 04/11/22       Visit Diagnosis: NSTEMI (non-ST elevated myocardial infarction)  Status post coronary artery stent placement  Patient's Home Medications on Admission:  Current Outpatient Medications:    acetaminophen (TYLENOL 8 HOUR) 650 MG CR tablet, Take 1 tablet (650 mg total) by mouth 2 (two) times a day., Disp: , Rfl:    aspirin 81 MG chewable tablet, Chew 1 tablet by mouth daily., Disp: , Rfl:    Cholecalciferol (VITAMIN D3) 25 MCG (1000 UT) CAPS, Take 1 capsule (1,000 Units total) by mouth daily., Disp: 30 capsule, Rfl:    clopidogrel (PLAVIX) 75 MG tablet, Take 75 mg by mouth daily., Disp: , Rfl:    cyanocobalamin 1000 MCG tablet, Take 1,000 mcg by mouth daily., Disp: , Rfl:    docusate sodium (COLACE) 100 MG capsule, Take 1 capsule (100 mg total) by mouth daily., Disp: , Rfl:    famotidine (PEPCID) 20 MG tablet, TAKE 1 TABLET BY MOUTH AT  BEDTIME (Patient taking differently: Take 20 mg by mouth at bedtime.), Disp: 90 tablet, Rfl: 3   finasteride (PROSCAR) 5 MG tablet, TAKE 1 TABLET BY MOUTH  DAILY (Patient taking differently: Take 5 mg by mouth daily.), Disp: 90 tablet, Rfl: 3   furosemide (LASIX) 20 MG tablet, Take 1 tablet by mouth every other day., Disp: , Rfl:    hydrALAZINE (APRESOLINE) 50 MG tablet, Take 1 tablet (50 mg total) by mouth every 8 (eight) hours., Disp: 90 tablet, Rfl: 0   isosorbide mononitrate (IMDUR) 60 MG 24 hr tablet, Take 1 tablet (60 mg total) by mouth 2 (two) times daily., Disp: 60 tablet, Rfl: 1   loratadine (CLARITIN) 10 MG tablet, Take 1 tablet (10 mg total) by mouth daily as  needed for allergies., Disp: 30 tablet, Rfl:    metoprolol tartrate (LOPRESSOR) 50 MG tablet, Take 0.5 tablets (25 mg total) by mouth 2 (two) times daily., Disp: , Rfl:    nitroGLYCERIN (NITROSTAT) 0.4 MG SL tablet, Place 1 tablet (0.4 mg total) under the tongue every 5 (five) minutes x 3 doses as needed for chest pain., Disp: 30 tablet, Rfl: 12   pantoprazole (PROTONIX) 40 MG tablet, Take 40 mg by mouth 2 (two) times daily., Disp: , Rfl:    polyethylene glycol powder (GLYCOLAX/MIRALAX) 17 GM/SCOOP powder, Take 8.5-17 g by mouth daily as needed for moderate constipation. (Patient not taking: Reported on 04/08/2022), Disp: 3350 g, Rfl: 0   primidone (MYSOLINE) 50 MG tablet, Take 2 tablets (100 mg total) by mouth 3 (three) times daily., Disp: , Rfl:    rosuvastatin (CRESTOR) 40 MG tablet, Take 1 tablet (40 mg total) by mouth at bedtime., Disp: 30 tablet, Rfl: 2  Past Medical History: Past Medical History:  Diagnosis Date   Abnormal drug screen 06/22/2014   inapprop neg xanax rpt 3 mo (06/2014)   BPH (benign prostatic hypertrophy) 01/21/1998   has had 3 biopsies in past ([facility]) decided to stop PSA/DRE   CHF (congestive heart failure) (HCC)    Coronary artery disease    Hyperlipidemia 01/21/2002   Hypertension 05/22/2003   Left lumbar radiculopathy  Osteoarthritis 01/22/1988   knees, lumbar spondylosis and listhesis    Tobacco Use: Social History   Tobacco Use  Smoking Status Former   Packs/day: 0.00   Years: 0.00   Additional pack years: 0.00   Total pack years: 0.00   Types: Pipe, Cigarettes   Quit date: 12/26/2000   Years since quitting: 21.3   Passive exposure: Past  Smokeless Tobacco Current   Types: Chew  Tobacco Comments   quit over 30 years ago    Labs: Review Flowsheet  More data exists      Latest Ref Rng & Units 08/18/2018 08/25/2019 08/25/2020 01/17/2021 01/18/2021  Labs for ITP Cardiac and Pulmonary Rehab  Cholestrol 0 - 200 mg/dL 409  811  914  - 782   LDL  (calc) 0 - 99 mg/dL 956  213  086  - 99   HDL-C >40 mg/dL 57.84  69.62  95.28  - 45   Trlycerides <150 mg/dL 41.3  244.0  102.7  - 66   Hemoglobin A1c 4.8 - 5.6 % - - - 5.0  -     Exercise Target Goals: Exercise Program Goal: Individual exercise prescription set using results from initial 6 min walk test and THRR while considering  patient's activity barriers and safety.   Exercise Prescription Goal: Initial exercise prescription builds to 30-45 minutes a day of aerobic activity, 2-3 days per week.  Home exercise guidelines will be given to patient during program as part of exercise prescription that the participant will acknowledge.   Education: Aerobic Exercise: - Group verbal and visual presentation on the components of exercise prescription. Introduces F.I.T.T principle from ACSM for exercise prescriptions.  Reviews F.I.T.T. principles of aerobic exercise including progression. Written material given at graduation. Flowsheet Row Cardiac Rehab from 05/01/2022 in [facility] Cardiac and Pulmonary Rehab  Education need identified 04/11/22       Education: Resistance Exercise: - Group verbal and visual presentation on the components of exercise prescription. Introduces F.I.T.T principle from ACSM for exercise prescriptions  Reviews F.I.T.T. principles of resistance exercise including progression. Written material given at graduation. Flowsheet Row Cardiac Rehab from 09/13/2021 in [facility] Cardiac and Pulmonary Rehab  Date 08/09/21  Educator [facility]  Instruction Review Code 1- Verbalizes Understanding        Education: Exercise & Equipment Safety: - Individual verbal instruction and demonstration of equipment use and safety with use of the equipment. Flowsheet Row Cardiac Rehab from 05/01/2022 in [facility] Cardiac and Pulmonary Rehab  Education need identified 04/08/22  Date 04/08/22  Educator [facility]  Instruction Review Code 1- Verbalizes Understanding       Education: Exercise Physiology & General  Exercise Guidelines: - Group verbal and written instruction with models to review the exercise physiology of the cardiovascular system and associated critical values. Provides general exercise guidelines with specific guidelines to those with heart or lung disease.  Flowsheet Row Cardiac Rehab from 09/13/2021 in [facility] Cardiac and Pulmonary Rehab  Education need identified 04/03/21       Education: Flexibility, Balance, Mind/Body Relaxation: - Group verbal and visual presentation with interactive activity on the components of exercise prescription. Introduces F.I.T.T principle from ACSM for exercise prescriptions. Reviews F.I.T.T. principles of flexibility and balance exercise training including progression. Also discusses the mind body connection.  Reviews various relaxation techniques to help reduce and manage stress (i.e. Deep breathing, progressive muscle relaxation, and visualization). Balance handout provided to take home. Written material given at graduation. Flowsheet Row Cardiac Rehab from 09/13/2021 in [facility] Cardiac  and Pulmonary Rehab  Date 08/09/21  Educator [facility]  Instruction Review Code 1- Verbalizes Understanding       Activity Barriers & Risk Stratification:  Activity Barriers & Cardiac Risk Stratification - 04/11/22 1332       Activity Barriers & Cardiac Risk Stratification   Activity Barriers Decreased Ventricular Function;Joint Problems    Comments Suprapubic catheter- reports no limitation or problems with movement or activity, R Shoulder pain, L hip pain    Cardiac Risk Stratification High             6 Minute Walk:  6 Minute Walk     Row Name 04/11/22 1327         6 Minute Walk   Phase Initial     Distance 910 feet     Walk Time 6 minutes     # of Rest Breaks 0     MPH 1.72     METS 1.2     RPE 11     Perceived Dyspnea  1     VO2 Peak 4.19     Symptoms Yes (comment)     Comments hip pain     Resting HR 57 bpm     Resting BP 138/60     Resting Oxygen  Saturation  97 %     Exercise Oxygen Saturation  during 6 min walk 96 %     Max Ex. HR 82 bpm     Max Ex. BP 152/54     2 Minute Post BP 142/52              Oxygen Initial Assessment:   Oxygen Re-Evaluation:   Oxygen Discharge (Final Oxygen Re-Evaluation):   Initial Exercise Prescription:  Initial Exercise Prescription - 04/11/22 1300       Date of Initial Exercise RX and Referring Provider   Date 04/11/22    Referring Provider Dr. Dorothyann Peng, MD      Oxygen   Maintain Oxygen Saturation 88% or higher      Recumbant Bike   Level 2    Watts 15    Minutes 15    METs 1.2      NuStep   Level 2    Minutes 15    METs 1.2      Track   Laps 20    Minutes 15    METs 2.09      Prescription Details   Frequency (times per week) 2    Duration Progress to 30 minutes of continuous aerobic without signs/symptoms of physical distress      Intensity   THRR 40-80% of Max Heartrate 85-115    Ratings of Perceived Exertion 11-13    Perceived Dyspnea 0-4      Progression   Progression Continue to progress workloads to maintain intensity without signs/symptoms of physical distress.      Resistance Training   Training Prescription Yes    Weight 4 lb    Reps 10-15             Perform Capillary Blood Glucose checks as needed.  Exercise Prescription Changes:   Exercise Prescription Changes     Row Name 04/11/22 1300 04/22/22 1400 05/01/22 1000 05/06/22 1100       Response to Exercise   Blood Pressure (Admit) 138/60 108/54 -- 112/62    Blood Pressure (Exercise) 152/54 130/68 -- --    Blood Pressure (Exit) 142/52 116/68 -- 100/60    Heart Rate (Admit) 57 bpm  57 bpm -- 60 bpm    Heart Rate (Exercise) 82 bpm 83 bpm -- 97 bpm    Heart Rate (Exit) 60 bpm 65 bpm -- 61 bpm    Oxygen Saturation (Admit) 97 % -- -- --    Oxygen Saturation (Exercise) 96 % -- -- --    Rating of Perceived Exertion (Exercise) 11 13 -- 13    Perceived Dyspnea (Exercise) 1 -- -- --     Symptoms hip pain none -- none    Comments Results First two days of exercise -- --    Duration -- Continue with 30 min of aerobic exercise without signs/symptoms of physical distress. -- Continue with 30 min of aerobic exercise without signs/symptoms of physical distress.    Intensity -- THRR unchanged -- THRR unchanged      Progression   Progression -- Continue to progress workloads to maintain intensity without signs/symptoms of physical distress. -- Continue to progress workloads to maintain intensity without signs/symptoms of physical distress.    Average METs -- 2.8 -- 2.58      Resistance Training   Training Prescription -- Yes -- Yes    Weight -- 4 lb -- 4 lb    Reps -- 10-15 -- 10-15      Interval Training   Interval Training -- No -- No      Recumbant Bike   Level -- 2 -- 3    Watts -- 15 -- 15    Minutes -- 15 -- 15    METs -- 3.1 -- 2.57      NuStep   Level -- 5 -- 5    Minutes -- 15 -- 15    METs -- 3.3 -- 3      T5 Nustep   Level -- -- -- 4    Minutes -- -- -- 15    METs -- -- -- 1.9      Biostep-RELP   Level -- -- -- 4    Minutes -- -- -- 15    METs -- -- -- 3      Track   Laps -- 13 -- 25    Minutes -- 10 -- 15    METs -- 1.71 -- 2.36      Home Exercise Plan   Plans to continue exercise at -- -- Home (comment)  walking Home (comment)  walking    Frequency -- -- Add 3 additional days to program exercise sessions. Add 3 additional days to program exercise sessions.    Initial Home Exercises Provided -- -- 05/01/22 05/01/22      Oxygen   Maintain Oxygen Saturation -- 88% or higher -- 88% or higher             Exercise Comments:   Exercise Comments     Row Name 04/15/22 1610           Exercise Comments First full day of exercise!  Patient was oriented to gym and equipment including functions, settings, policies, and procedures.  Patient's individual exercise prescription and treatment plan were reviewed.  All starting workloads  were established based on the results of the 6 minute walk test done at initial orientation visit.  The plan for exercise progression was also introduced and progression will be customized based on patient's performance and goals.                Exercise Goals and Review:   Exercise Goals     Row Name  04/11/22 1334             Exercise Goals   Increase Physical Activity Yes       Intervention Provide advice, education, support and counseling about physical activity/exercise needs.;Develop an individualized exercise prescription for aerobic and resistive training based on initial evaluation findings, risk stratification, comorbidities and participant's personal goals.       Expected Outcomes Short Term: Attend rehab on a regular basis to increase amount of physical activity.;Long Term: Add in home exercise to make exercise part of routine and to increase amount of physical activity.;Long Term: Exercising regularly at least 3-5 days a week.       Increase Strength and Stamina Yes       Intervention Provide advice, education, support and counseling about physical activity/exercise needs.;Develop an individualized exercise prescription for aerobic and resistive training based on initial evaluation findings, risk stratification, comorbidities and participant's personal goals.       Expected Outcomes Short Term: Increase workloads from initial exercise prescription for resistance, speed, and METs.;Short Term: Perform resistance training exercises routinely during rehab and add in resistance training at home;Long Term: Improve cardiorespiratory fitness, muscular endurance and strength as measured by increased METs and functional capacity ( )       Able to understand and use rate of perceived exertion (RPE) scale Yes       Intervention Provide education and explanation on how to use RPE scale       Expected Outcomes Short Term: Able to use RPE daily in rehab to express subjective intensity  level;Long Term:  Able to use RPE to guide intensity level when exercising independently       Able to understand and use Dyspnea scale Yes       Intervention Provide education and explanation on how to use Dyspnea scale       Expected Outcomes Short Term: Able to use Dyspnea scale daily in rehab to express subjective sense of shortness of breath during exertion;Long Term: Able to use Dyspnea scale to guide intensity level when exercising independently       Knowledge and understanding of Target Heart Rate Range (THRR) Yes       Intervention Provide education and explanation of THRR including how the numbers were predicted and where they are located for reference       Expected Outcomes Short Term: Able to use daily as guideline for intensity in rehab;Short Term: Able to state/look up THRR;Long Term: Able to use THRR to govern intensity when exercising independently       Able to check pulse independently Yes       Intervention Provide education and demonstration on how to check pulse in carotid and radial arteries.;Review the importance of being able to check your own pulse for safety during independent exercise       Expected Outcomes Short Term: Able to explain why pulse checking is important during independent exercise;Long Term: Able to check pulse independently and accurately       Understanding of Exercise Prescription Yes       Intervention Provide education, explanation, and written materials on patient's individual exercise prescription       Expected Outcomes Short Term: Able to explain program exercise prescription;Long Term: Able to explain home exercise prescription to exercise independently                Exercise Goals Re-Evaluation :  Exercise Goals Re-Evaluation     Row Name 04/15/22 [phone] 04/22/22 1432 05/01/22 1011  05/06/22 1156       Exercise Goal Re-Evaluation   Exercise Goals Review Able to understand and use rate of perceived exertion (RPE) scale;Able to understand  and use Dyspnea scale;Knowledge and understanding of Target Heart Rate Range (THRR);Understanding of Exercise Prescription Increase Physical Activity;Increase Strength and Stamina;Understanding of Exercise Prescription Increase Physical Activity;Increase Strength and Stamina;Understanding of Exercise Prescription;Able to understand and use rate of perceived exertion (RPE) scale;Able to understand and use Dyspnea scale;Knowledge and understanding of Target Heart Rate Range (THRR);Able to check pulse independently Increase Physical Activity;Increase Strength and Stamina;Understanding of Exercise Prescription    Comments Reviewed RPE scale, THR and program prescription with pt today.  Pt voiced understanding and was given a copy of goals to take home. Jathen is off to a good start in the program. During his first two sessions of rehab he was able to work at level 5 on the T4 nustep and level 2 on the recumbent bike. He also was able to walk 13 laps on the track in 10 minutes. We will continue to monitor his progress in the program. Reviewed home exercise with pt today.  Pt plans to walk at home for exercise.  Reviewed THR, pulse, RPE, sign and symptoms, pulse oximetery and when to call 911 or MD.  Also discussed weather considerations and indoor options.  Pt voiced understanding. Landy continues to do well in rehab. He increased to 25 laps on the track which is his highest yet. He also incresed to level 3 on the recumbent bike, we hope to see his watts improve over time. He is doing well at level 5 on the T4 Nustep and  reaches his THR some sessions. Will continue to monitor.    Expected Outcomes Short: Use RPE daily to regulate intensity. Long: Follow program prescription in THR. Short: Continue to follow current exercise prescription. Long: Continue to improve strength and stamina. Short: Start to walk again at home Long: Continue to exercise independently Short: Increase watts on recumbent bike Long: Continue to  increase overall MET level and stamina             Discharge Exercise Prescription (Final Exercise Prescription Changes):  Exercise Prescription Changes - 05/06/22 1100       Response to Exercise   Blood Pressure (Admit) 112/62    Blood Pressure (Exit) 100/60    Heart Rate (Admit) 60 bpm    Heart Rate (Exercise) 97 bpm    Heart Rate (Exit) 61 bpm    Rating of Perceived Exertion (Exercise) 13    Symptoms none    Duration Continue with 30 min of aerobic exercise without signs/symptoms of physical distress.    Intensity THRR unchanged      Progression   Progression Continue to progress workloads to maintain intensity without signs/symptoms of physical distress.    Average METs 2.58      Resistance Training   Training Prescription Yes    Weight 4 lb    Reps 10-15      Interval Training   Interval Training No      Recumbant Bike   Level 3    Watts 15    Minutes 15    METs 2.57      NuStep   Level 5    Minutes 15    METs 3      T5 Nustep   Level 4    Minutes 15    METs 1.9      Biostep-RELP   Level 4  Minutes 15    METs 3      Track   Laps 25    Minutes 15    METs 2.36      Home Exercise Plan   Plans to continue exercise at Home (comment)   walking   Frequency Add 3 additional days to program exercise sessions.    Initial Home Exercises Provided 05/01/22      Oxygen   Maintain Oxygen Saturation 88% or higher             Nutrition:  Target Goals: Understanding of nutrition guidelines, daily intake of sodium 1500mg , cholesterol 200mg , calories 30% from fat and 7% or less from saturated fats, daily to have 5 or more servings of fruits and vegetables.  Education: All About Nutrition: -Group instruction provided by verbal, written material, interactive activities, discussions, models, and posters to present general guidelines for heart healthy nutrition including fat, fiber, MyPlate, the role of sodium in heart healthy nutrition, utilization of  the nutrition label, and utilization of this knowledge for meal planning. Follow up email sent as well. Written material given at graduation. Flowsheet Row Cardiac Rehab from 05/01/2022 in [facility] Cardiac and Pulmonary Rehab  Education need identified 04/11/22       Biometrics:  Pre Biometrics - 04/11/22 1335       Pre Biometrics   Height 5' 10.5" (1.791 m)    Weight 181 lb 12.8 oz (82.5 kg)    Waist Circumference 38 inches    Hip Circumference 40 inches    Waist to Hip Ratio 0.95 %    BMI (Calculated) 25.71    Single Leg Stand 3.4 seconds              Nutrition Therapy Plan and Nutrition Goals:  Nutrition Therapy & Goals - 04/11/22 1108       Intervention Plan   Intervention Prescribe, educate and counsel regarding individualized specific dietary modifications aiming towards targeted core components such as weight, hypertension, lipid management, diabetes, heart failure and other comorbidities.    Expected Outcomes Short Term Goal: Understand basic principles of dietary content, such as calories, fat, sodium, cholesterol and nutrients.;Short Term Goal: A plan has been developed with personal nutrition goals set during dietitian appointment.;Long Term Goal: Adherence to prescribed nutrition plan.             Nutrition Assessments:  MEDIFICTS Score Key: ?70 Need to make dietary changes  40-70 Heart Healthy Diet ? 40 Therapeutic Level Cholesterol Diet  Flowsheet Row Cardiac Rehab from 04/11/2022 in [facility] Cardiac and Pulmonary Rehab  Picture Your Plate Total Score on Admission 86      Picture Your Plate Scores: <40 Unhealthy dietary pattern with much room for improvement. 41-50 Dietary pattern unlikely to meet recommendations for good health and room for improvement. 51-60 More healthful dietary pattern, with some room for improvement.  >60 Healthy dietary pattern, although there may be some specific behaviors that could be improved.    Nutrition Goals  Re-Evaluation:  Nutrition Goals Re-Evaluation     Row Name 05/01/22 0950             Goals   Current Weight 182 lb (82.6 kg)       Nutrition Goal Continue to eat heart healthy       Comment Julio does not want to meet with RD. He has a good hold on his diet, eats lots of fruits and does not over eat.       Expected Outcome  Short: continue heart healthy diet. Long: adhere to his heart healthy diet.                Nutrition Goals Discharge (Final Nutrition Goals Re-Evaluation):  Nutrition Goals Re-Evaluation - 05/01/22 0950       Goals   Current Weight 182 lb (82.6 kg)    Nutrition Goal Continue to eat heart healthy    Comment Siaosi does not want to meet with RD. He has a good hold on his diet, eats lots of fruits and does not over eat.    Expected Outcome Short: continue heart healthy diet. Long: adhere to his heart healthy diet.             Psychosocial: Target Goals: Acknowledge presence or absence of significant depression and/or stress, maximize coping skills, provide positive support system. Participant is able to verbalize types and ability to use techniques and skills needed for reducing stress and depression.   Education: Stress, Anxiety, and Depression - Group verbal and visual presentation to define topics covered.  Reviews how body is impacted by stress, anxiety, and depression.  Also discusses healthy ways to reduce stress and to treat/manage anxiety and depression.  Written material given at graduation. Flowsheet Row Cardiac Rehab from 09/13/2021 in [facility] Cardiac and Pulmonary Rehab  Date 09/13/21  Educator [facility]  Instruction Review Code 1- [employer] Understanding       Education: Sleep Hygiene -Provides group verbal and written instruction about how sleep can affect your health.  Define sleep hygiene, discuss sleep cycles and impact of sleep habits. Review good sleep hygiene tips.    Initial Review & Psychosocial Screening:  Initial Psych Review &  Screening - 04/08/22 0937       Initial Review   Current issues with None Identified      Family Dynamics   Good Support System? Yes    Comments Jabree states that he has no issues with depression or anxiets and does not take anything for his mood. He can look to his wife, son, daughter and grandaughter for support.      Barriers   Psychosocial barriers to participate in program There are no identifiable barriers or psychosocial needs.;The patient should benefit from training in stress management and relaxation.      Screening Interventions   Interventions Encouraged to exercise;To provide support and resources with identified psychosocial needs;Provide feedback about the scores to participant    Expected Outcomes Long Term Goal: Stressors or current issues are controlled or eliminated.;Short Term goal: Utilizing psychosocial counselor, staff and physician to assist with identification of specific Stressors or current issues interfering with healing process. Setting desired goal for each stressor or current issue identified.;Short Term goal: Identification and review with participant of any Quality of Life or Depression concerns found by scoring the questionnaire.;Long Term goal: The participant improves quality of Life and PHQ9 Scores as seen by post scores and/or verbalization of changes             Quality of Life Scores:   Quality of Life - 04/11/22 1055       Quality of Life   Select Quality of Life      Quality of Life Scores   Health/Function Pre 26.57 %    Socioeconomic Pre 25.63 %    Psych/Spiritual Pre 29.64 %    Family Pre 28.8 %    GLOBAL Pre 27.29 %            Scores of 19 and below  usually indicate a poorer quality of life in these areas.  A difference of  2-3 points is a clinically meaningful difference.  A difference of 2-3 points in the total score of the Quality of Life Index has been associated with significant improvement in overall quality of life,  self-image, physical symptoms, and general health in studies assessing change in quality of life.  PHQ-9: Review Flowsheet  More data exists      04/11/2022 09/11/2021 04/03/2021 03/16/2021 02/01/2021  Depression screen PHQ 2/9  Decreased Interest 0 0 0 0 0  Down, Depressed, Hopeless 0 0 0 0 0  PHQ - 2 Score 0 0 0 0 0  Altered sleeping 1 0 1 - -  Tired, decreased energy 1 1 1  - -  Change in appetite 1 0 0 - -  Feeling bad or failure about yourself  0 0 0 - -  Trouble concentrating 1 0 0 - -  Moving slowly or fidgety/restless 0 0 0 - -  Suicidal thoughts 0 0 0 - -  PHQ-9 Score 4 1 2  - -  Difficult doing work/chores Somewhat difficult Not difficult at all Not difficult at all - -   Interpretation of Total Score  Total Score Depression Severity:  1-4 = Minimal depression, 5-9 = Mild depression, 10-14 = Moderate depression, 15-19 = Moderately severe depression, 20-27 = Severe depression   Psychosocial Evaluation and Intervention:  Psychosocial Evaluation - 04/08/22 0939       Psychosocial Evaluation & Interventions   Interventions Encouraged to exercise with the program and follow exercise prescription;Relaxation education;Stress management education    Comments Freemon states that he has no issues with depression or anxiets and does not take anything for his mood. He can look to his wife, son, daughter and grandaughter for support.    Expected Outcomes Short: Attend [facility] stress management education to decrease stress. Long: Maintain exercise Post [facility] to keep stress at a minimum.    Continue Psychosocial Services  Follow up required by staff             Psychosocial Re-Evaluation:  Psychosocial Re-Evaluation     Row Name 05/01/22 [phone]             Psychosocial Re-Evaluation   Current issues with None Identified       Comments Patient reports no issues with their current mental states, sleep, stress, depression or anxiety. Will follow up with patient in a few weeks  for any changes.       Expected Outcomes Short: Continue to exercise regularly to support mental health and notify staff of any changes. Long: maintain mental health and well being through teaching of rehab or prescribed medications independently.       Interventions Encouraged to attend Cardiac Rehabilitation for the exercise       Continue Psychosocial Services  Follow up required by staff                Psychosocial Discharge (Final Psychosocial Re-Evaluation):  Psychosocial Re-Evaluation - 05/01/22 0949       Psychosocial Re-Evaluation   Current issues with None Identified    Comments Patient reports no issues with their current mental states, sleep, stress, depression or anxiety. Will follow up with patient in a few weeks for any changes.    Expected Outcomes Short: Continue to exercise regularly to support mental health and notify staff of any changes. Long: maintain mental health and well being through teaching of rehab or prescribed medications independently.  Interventions Encouraged to attend Cardiac Rehabilitation for the exercise    Continue Psychosocial Services  Follow up required by staff             Vocational Rehabilitation: Provide vocational rehab assistance to qualifying candidates.   Vocational Rehab Evaluation & Intervention:   Education: Education Goals: Education classes will be provided on a variety of topics geared toward better understanding of heart health and risk factor modification. Participant will state understanding/return demonstration of topics presented as noted by education test scores.  Learning Barriers/Preferences:  Learning Barriers/Preferences - 04/08/22 0935       Learning Barriers/Preferences   Learning Barriers None    Learning Preferences None             General Cardiac Education Topics:  AED/CPR: - Group verbal and written instruction with the use of models to demonstrate the basic use of the AED with the basic  ABC's of resuscitation.   Anatomy and Cardiac Procedures: - Group verbal and visual presentation and models provide information about basic cardiac anatomy and function. Reviews the testing methods done to diagnose heart disease and the outcomes of the test results. Describes the treatment choices: Medical Management, Angioplasty, or Coronary Bypass Surgery for treating various heart conditions including Myocardial Infarction, Angina, Valve Disease, and Cardiac Arrhythmias.  Written material given at graduation. Flowsheet Row Cardiac Rehab from 05/01/2022 in [facility] Cardiac and Pulmonary Rehab  Education need identified 04/11/22       Medication Safety: - Group verbal and visual instruction to review commonly prescribed medications for heart and lung disease. Reviews the medication, class of the drug, and side effects. Includes the steps to properly store meds and maintain the prescription regimen.  Written material given at graduation.   Intimacy: - Group verbal instruction through game format to discuss how heart and lung disease can affect sexual intimacy. Written material given at graduation..   Know Your Numbers and Heart Failure: - Group verbal and visual instruction to discuss disease risk factors for cardiac and pulmonary disease and treatment options.  Reviews associated critical values for Overweight/Obesity, Hypertension, Cholesterol, and Diabetes.  Discusses basics of heart failure: signs/symptoms and treatments.  Introduces Heart Failure Zone chart for action plan for heart failure.  Written material given at graduation. Flowsheet Row Cardiac Rehab from 09/13/2021 in [facility] Cardiac and Pulmonary Rehab  Date 08/30/21  Educator SB  Instruction Review Code 1- Verbalizes Understanding       Infection Prevention: - Provides verbal and written material to individual with discussion of infection control including proper hand washing and proper equipment cleaning during exercise  session. Flowsheet Row Cardiac Rehab from 05/01/2022 in [facility] Cardiac and Pulmonary Rehab  Date 04/08/22  Educator jh  Instruction Review Code 1- Verbalizes Understanding       Falls Prevention: - Provides verbal and written material to individual with discussion of falls prevention and safety. Flowsheet Row Cardiac Rehab from 05/01/2022 in [facility] Cardiac and Pulmonary Rehab  Date 04/08/22  Educator jh  Instruction Review Code 1- Verbalizes Understanding       Other: -Provides group and verbal instruction on various topics (see comments)   Knowledge Questionnaire Score:  Knowledge Questionnaire Score - 04/11/22 1054       Knowledge Questionnaire Score   Pre Score 22/26             Core Components/Risk Factors/Patient Goals at Admission:  Personal Goals and Risk Factors at Admission - 04/11/22 1110       Core  Components/Risk Factors/Patient Goals on Admission    Weight Management Yes;Weight Maintenance    Admit Weight 181 lb 12.8 oz (82.5 kg)    Goal Weight: Short Term 181 lb 12.8 oz (82.5 kg)    Goal Weight: Long Term 181 lb 12.8 oz (82.5 kg)    Expected Outcomes Short Term: Continue to assess and modify interventions until short term weight is achieved;Long Term: Adherence to nutrition and physical activity/exercise program aimed toward attainment of established weight goal;Weight Maintenance: Understanding of the daily nutrition guidelines, which includes 25-35% calories from fat, 7% or less cal from saturated fats, less than 200mg  cholesterol, less than 1.5gm of sodium, & 5 or more servings of fruits and vegetables daily;Understanding recommendations for meals to include 15-35% energy as protein, 25-35% energy from fat, 35-60% energy from carbohydrates, less than 200mg  of dietary cholesterol, 20-35 gm of total fiber daily;Understanding of distribution of calorie intake throughout the day with the consumption of 4-5 meals/snacks    Heart Failure Yes    Intervention Provide  a combined exercise and nutrition program that is supplemented with education, support and counseling about heart failure. Directed toward relieving symptoms such as shortness of breath, decreased exercise tolerance, and extremity edema.    Expected Outcomes Improve functional capacity of life;Short term: Attendance in program 2-3 days a week with increased exercise capacity. Reported lower sodium intake. Reported increased fruit and vegetable intake. Reports medication compliance.;Long term: Adoption of self-care skills and reduction of barriers for early signs and symptoms recognition and intervention leading to self-care maintenance.;Short term: Daily weights obtained and reported for increase. Utilizing diuretic protocols set by physician.    Hypertension Yes    Intervention Provide education on lifestyle modifcations including regular physical activity/exercise, weight management, moderate sodium restriction and increased consumption of fresh fruit, vegetables, and low fat dairy, alcohol moderation, and smoking cessation.;Monitor prescription use compliance.    Expected Outcomes Short Term: Continued assessment and intervention until BP is < 140/10mm HG in hypertensive participants. < 130/57mm HG in hypertensive participants with diabetes, heart failure or chronic kidney disease.;Long Term: Maintenance of blood pressure at goal levels.    Lipids Yes    Intervention Provide education and support for participant on nutrition & aerobic/resistive exercise along with prescribed medications to achieve LDL 70mg , HDL >40mg .    Expected Outcomes Short Term: Participant states understanding of desired cholesterol values and is compliant with medications prescribed. Participant is following exercise prescription and nutrition guidelines.;Long Term: Cholesterol controlled with medications as prescribed, with individualized exercise RX and with personalized nutrition plan. Value goals: LDL < 70mg , HDL > 40 mg.              Education:Diabetes - Individual verbal and written instruction to review signs/symptoms of diabetes, desired ranges of glucose level fasting, after meals and with exercise. Acknowledge that pre and post exercise glucose checks will be done for 3 sessions at entry of program.   Core Components/Risk Factors/Patient Goals Review:   Goals and Risk Factor Review     Row Name 05/01/22 0953             Core Components/Risk Factors/Patient Goals Review   Personal Goals Review Weight Management/Obesity;Hypertension       Review Maksim is doing well with his exercise. He is turning 89 years old this week. His blood pressure and weight is where it needs to be and he wants to maintain his weight.       Expected Outcomes Short: maintain weight and blood pressure  readings. Long: maintain weight and blood pressure readings independently.                Core Components/Risk Factors/Patient Goals at Discharge (Final Review):   Goals and Risk Factor Review - 05/01/22 0953       Core Components/Risk Factors/Patient Goals Review   Personal Goals Review Weight Management/Obesity;Hypertension    Review Shaurya is doing well with his exercise. He is turning 89 years old this week. His blood pressure and weight is where it needs to be and he wants to maintain his weight.    Expected Outcomes Short: maintain weight and blood pressure readings. Long: maintain weight and blood pressure readings independently.             ITP Comments:  ITP Comments     Row Name 04/08/22 [phone] 04/11/22 1052 04/15/22 0928 05/08/22 0743     ITP Comments In person Visit completed. Patient informed on EP and RD appointment and 6 Minute walk test. Patient also informed of patient health questionnaires on My Chart. Patient Verbalizes understanding. Visit diagnosis can be found in [facility] 03/25/2022. Completed and gym orientation. Initial ITP created and sent for review to Dr. Bethann Punches, Medical Director. First  full day of exercise!  Patient was oriented to gym and equipment including functions, settings, policies, and procedures.  Patient's individual exercise prescription and treatment plan were reviewed.  All starting workloads were established based on the results of the 6 minute walk test done at initial orientation visit.  The plan for exercise progression was also introduced and progression will be customized based on patient's performance and goals. 30 day review completed. ITP sent to Dr. Bethann Punches, Medical Director of Cardiac Rehab. Continue with ITP unless changes are made by physician.             Comments: 30 day review

## 2022-05-08 NOTE — Progress Notes (Signed)
Daily Session Note  Patient Details  Name: Shawn Andrews MRN: [id number] Date of Birth: 09/08/1928 Referring Provider:   Flowsheet Row Cardiac Rehab from 04/11/2022 in [facility] Cardiac and Pulmonary Rehab  Referring Provider Dr. Dorothyann Peng, MD       Encounter Date: 05/08/2022  Check In:  Session Check In - 05/08/22 0916       Check-In   Supervising physician immediately available to respond to emergencies See telemetry face sheet for immediately available ER MD    Location [facility]-Cardiac & Pulmonary Rehab    Staff Present Lanny Hurst, RN, ADN;Joseph Reino Kent, RCP,RRT,BSRT;Noah Tickle, BS, Exercise Physiologist    Virtual Visit No    Medication changes reported     No    Fall or balance concerns reported    No    Warm-up and Cool-down Performed on first and last piece of equipment    Resistance Training Performed Yes    VAD Patient? No    PAD/SET Patient? No      Pain Assessment   Currently in Pain? No/denies                Social History   Tobacco Use  Smoking Status Former   Packs/day: 0.00   Years: 0.00   Additional pack years: 0.00   Total pack years: 0.00   Types: Pipe, Cigarettes   Quit date: 12/26/2000   Years since quitting: 21.3   Passive exposure: Past  Smokeless Tobacco Current   Types: Chew  Tobacco Comments   quit over 30 years ago    Goals Met:  Independence with exercise equipment Exercise tolerated well No report of concerns or symptoms today Strength training completed today  Goals Unmet:  Not Applicable  Comments: Pt able to follow exercise prescription today without complaint.  Will continue to monitor for progression.    Dr. Bethann Punches is Medical Director for [facility] Cardiac Rehabilitation.  Dr. Vida Rigger is Medical Director for [facility] Pulmonary Rehabilitation.

## 2022-05-13 ENCOUNTER — Encounter: Payer: Medicare Other | Admitting: *Deleted

## 2022-05-13 DIAGNOSIS — I214 Non-ST elevation (NSTEMI) myocardial infarction: Secondary | ICD-10-CM

## 2022-05-13 DIAGNOSIS — Z955 Presence of coronary angioplasty implant and graft: Secondary | ICD-10-CM | POA: Diagnosis not present

## 2022-05-13 NOTE — Progress Notes (Signed)
Daily Session Note  Patient Details  Name: Shawn Andrews MRN: [id number] Date of Birth: [birth year] Referring Provider:   Flowsheet Row Cardiac Rehab from 04/11/2022 in [facility] Cardiac and Pulmonary Rehab  Referring Provider Dr. Dorothyann Peng, MD       Encounter Date: 05/13/2022  Check In:  Session Check In - 05/13/22 0917       Check-In   Supervising physician immediately available to respond to emergencies See telemetry face sheet for immediately available ER MD    Location [facility]-Cardiac & Pulmonary Rehab    Staff Present Lanny Hurst, RN, Franki Monte, BS, ACSM CEP, Exercise Physiologist;Noah Tickle, BS, Exercise Physiologist    Virtual Visit No    Medication changes reported     No    Fall or balance concerns reported    No    Warm-up and Cool-down Performed on first and last piece of equipment    Resistance Training Performed Yes    VAD Patient? No    PAD/SET Patient? No      Pain Assessment   Currently in Pain? No/denies                Social History   Tobacco Use  Smoking Status Former   Packs/day: 0.00   Years: 0.00   Additional pack years: 0.00   Total pack years: 0.00   Types: Pipe, Cigarettes   Quit date: 12/26/2000   Years since quitting: 21.3   Passive exposure: Past  Smokeless Tobacco Current   Types: Chew  Tobacco Comments   quit over 30 years ago    Goals Met:  Independence with exercise equipment Exercise tolerated well No report of concerns or symptoms today Strength training completed today  Goals Unmet:  Not Applicable  Comments: Pt able to follow exercise prescription today without complaint.  Will continue to monitor for progression.    Dr. Bethann Punches is Medical Director for [facility] Cardiac Rehabilitation.  Dr. Vida Rigger is Medical Director for [facility] Pulmonary Rehabilitation.

## 2022-05-15 ENCOUNTER — Encounter: Payer: Medicare Other | Admitting: *Deleted

## 2022-05-15 DIAGNOSIS — Z955 Presence of coronary angioplasty implant and graft: Secondary | ICD-10-CM | POA: Diagnosis not present

## 2022-05-15 DIAGNOSIS — I214 Non-ST elevation (NSTEMI) myocardial infarction: Secondary | ICD-10-CM

## 2022-05-15 NOTE — Progress Notes (Signed)
Daily Session Note  Patient Details  Name: Shawn Andrews MRN: [id number] Date of Birth: November 25, 1928 Referring Provider:   Flowsheet Row Cardiac Rehab from 04/11/2022 in [facility] Cardiac and Pulmonary Rehab  Referring Provider Dr. Dorothyann Peng, MD       Encounter Date: 05/15/2022  Check In:  Session Check In - 05/15/22 0921       Check-In   Supervising physician immediately available to respond to emergencies See telemetry face sheet for immediately available ER MD    Location [facility]-Cardiac & Pulmonary Rehab    Staff Present Lanny Hurst, RN, ADN;Joseph Reino Kent, RCP,RRT,BSRT;Noah Tickle, BS, Exercise Physiologist    Virtual Visit No    Medication changes reported     No    Fall or balance concerns reported    No    Warm-up and Cool-down Performed on first and last piece of equipment    Resistance Training Performed Yes    VAD Patient? No    PAD/SET Patient? No      Pain Assessment   Currently in Pain? No/denies                Social History   Tobacco Use  Smoking Status Former   Packs/day: 0.00   Years: 0.00   Additional pack years: 0.00   Total pack years: 0.00   Types: Pipe, Cigarettes   Quit date: 12/26/2000   Years since quitting: 21.3   Passive exposure: Past  Smokeless Tobacco Current   Types: Chew  Tobacco Comments   quit over 30 years ago    Goals Met:  Independence with exercise equipment Exercise tolerated well No report of concerns or symptoms today Strength training completed today  Goals Unmet:  Not Applicable  Comments: Pt able to follow exercise prescription today without complaint.  Will continue to monitor for progression.    Dr. Bethann Punches is Medical Director for [facility] Cardiac Rehabilitation.  Dr. Vida Rigger is Medical Director for [facility] Pulmonary Rehabilitation.

## 2022-05-17 ENCOUNTER — Encounter: Payer: Medicare Other | Admitting: *Deleted

## 2022-05-17 DIAGNOSIS — I214 Non-ST elevation (NSTEMI) myocardial infarction: Secondary | ICD-10-CM

## 2022-05-17 DIAGNOSIS — Z955 Presence of coronary angioplasty implant and graft: Secondary | ICD-10-CM

## 2022-05-17 NOTE — Progress Notes (Signed)
Daily Session Note  Patient Details  Name: Shawn Andrews MRN: [id number] Date of Birth: 06/04/1928 Referring Provider:   Flowsheet Row Cardiac Rehab from 04/11/2022 in [facility] Cardiac and Pulmonary Rehab  Referring Provider Dr. Dorothyann Peng, MD       Encounter Date: 05/17/2022  Check In:  Session Check In - 05/17/22 0929       Check-In   Supervising physician immediately available to respond to emergencies See telemetry face sheet for immediately available ER MD    Location [facility]-Cardiac & Pulmonary Rehab    Staff Present Bess Kinds RN, BSN;Susanne Bice, RN, BSN, CCRP;Other   Johnathan Hausen, BS & [place] Biglon, HFS   Virtual Visit No    Medication changes reported     No    Fall or balance concerns reported    No    Tobacco Cessation No Change    Warm-up and Cool-down Performed on first and last piece of equipment    Resistance Training Performed Yes    VAD Patient? No    PAD/SET Patient? No      Pain Assessment   Currently in Pain? No/denies                Social History   Tobacco Use  Smoking Status Former   Packs/day: 0.00   Years: 0.00   Additional pack years: 0.00   Total pack years: 0.00   Types: Pipe, Cigarettes   Quit date: 12/26/2000   Years since quitting: 21.4   Passive exposure: Past  Smokeless Tobacco Current   Types: Chew  Tobacco Comments   quit over 30 years ago    Goals Met:  Independence with exercise equipment Exercise tolerated well No report of concerns or symptoms today Strength training completed today  Goals Unmet:  Not Applicable  Comments: Pt able to follow exercise prescription today without complaint.  Will continue to monitor for progression.    Dr. Bethann Punches is Medical Director for [facility] Cardiac Rehabilitation.  Dr. Vida Rigger is Medical Director for [facility] Pulmonary Rehabilitation.

## 2022-05-20 ENCOUNTER — Encounter: Payer: Medicare Other | Admitting: *Deleted

## 2022-05-20 DIAGNOSIS — Z955 Presence of coronary angioplasty implant and graft: Secondary | ICD-10-CM | POA: Diagnosis not present

## 2022-05-20 DIAGNOSIS — I214 Non-ST elevation (NSTEMI) myocardial infarction: Secondary | ICD-10-CM

## 2022-05-20 NOTE — Progress Notes (Signed)
Daily Session Note  Patient Details  Name: Shawn Andrews MRN: [id number] Date of Birth: 1928-05-13 Referring Provider:   Flowsheet Row Cardiac Rehab from 04/11/2022 in [facility] Cardiac and Pulmonary Rehab  Referring Provider Dr. Dorothyann Peng, MD       Encounter Date: 05/20/2022  Check In:  Session Check In - 05/20/22 0915       Check-In   Supervising physician immediately available to respond to emergencies See telemetry face sheet for immediately available ER MD    Location [facility]-Cardiac & Pulmonary Rehab    Staff Present Lanny Hurst, RN, Franki Monte, BS, ACSM CEP, Exercise Physiologist;Noah Tickle, BS, Exercise Physiologist    Virtual Visit No    Medication changes reported     No    Fall or balance concerns reported    No    Warm-up and Cool-down Performed on first and last piece of equipment    Resistance Training Performed Yes    VAD Patient? No    PAD/SET Patient? No      Pain Assessment   Currently in Pain? No/denies                Social History   Tobacco Use  Smoking Status Former   Packs/day: 0.00   Years: 0.00   Additional pack years: 0.00   Total pack years: 0.00   Types: Pipe, Cigarettes   Quit date: 12/26/2000   Years since quitting: 21.4   Passive exposure: Past  Smokeless Tobacco Current   Types: Chew  Tobacco Comments   quit over 30 years ago    Goals Met:  Independence with exercise equipment Exercise tolerated well No report of concerns or symptoms today Strength training completed today  Goals Unmet:  Not Applicable  Comments: Pt able to follow exercise prescription today without complaint.  Will continue to monitor for progression.    Dr. Bethann Punches is Medical Director for [facility] Cardiac Rehabilitation.  Dr. Vida Rigger is Medical Director for [facility] Pulmonary Rehabilitation.

## 2022-05-22 ENCOUNTER — Encounter: Payer: Medicare Other | Attending: Internal Medicine

## 2022-05-22 DIAGNOSIS — Z955 Presence of coronary angioplasty implant and graft: Secondary | ICD-10-CM | POA: Insufficient documentation

## 2022-05-22 DIAGNOSIS — I214 Non-ST elevation (NSTEMI) myocardial infarction: Secondary | ICD-10-CM | POA: Insufficient documentation

## 2022-05-23 ENCOUNTER — Encounter: Payer: Medicare Other | Admitting: *Deleted

## 2022-05-23 DIAGNOSIS — I214 Non-ST elevation (NSTEMI) myocardial infarction: Secondary | ICD-10-CM

## 2022-05-23 DIAGNOSIS — Z955 Presence of coronary angioplasty implant and graft: Secondary | ICD-10-CM | POA: Diagnosis present

## 2022-05-23 NOTE — Progress Notes (Signed)
Daily Session Note  Patient Details  Name: Shawn Andrews MRN: [id number] Date of Birth: October 19, 1928 Referring Provider:   Flowsheet Row Cardiac Rehab from 04/11/2022 in [facility] Cardiac and Pulmonary Rehab  Referring Provider Dr. Dorothyann Peng, MD       Encounter Date: 05/23/2022  Check In:  Session Check In - 05/23/22 0918       Check-In   Supervising physician immediately available to respond to emergencies See telemetry face sheet for immediately available ER MD    Location [facility]-Cardiac & Pulmonary Rehab    Staff Present Salley Scarlet, MS, ACSM CEP, Exercise Physiologist;Susanne Bice, RN, BSN, CCRP;Gibran Veselka [place], MA, RCEP, CCRP, CCET;Laureen Brown, BS, RRT, CPFT    Virtual Visit No    Medication changes reported     No    Fall or balance concerns reported    No    Warm-up and Cool-down Performed on first and last piece of equipment    Resistance Training Performed Yes    VAD Patient? No    PAD/SET Patient? No      Pain Assessment   Currently in Pain? No/denies                Social History   Tobacco Use  Smoking Status Former   Packs/day: 0.00   Years: 0.00   Additional pack years: 0.00   Total pack years: 0.00   Types: Pipe, Cigarettes   Quit date: 12/26/2000   Years since quitting: 21.4   Passive exposure: Past  Smokeless Tobacco Current   Types: Chew  Tobacco Comments   quit over 30 years ago    Goals Met:  Independence with exercise equipment Exercise tolerated well No report of concerns or symptoms today Strength training completed today  Goals Unmet:  Not Applicable  Comments: Pt able to follow exercise prescription today without complaint.  Will continue to monitor for progression.    Dr. Bethann Punches is Medical Director for [facility] Cardiac Rehabilitation.  Dr. Vida Rigger is Medical Director for [facility] Pulmonary Rehabilitation.

## 2022-05-24 ENCOUNTER — Encounter: Payer: Medicare Other | Admitting: *Deleted

## 2022-05-24 DIAGNOSIS — I214 Non-ST elevation (NSTEMI) myocardial infarction: Secondary | ICD-10-CM | POA: Diagnosis not present

## 2022-05-24 DIAGNOSIS — Z955 Presence of coronary angioplasty implant and graft: Secondary | ICD-10-CM

## 2022-05-24 NOTE — Progress Notes (Signed)
Daily Session Note  Patient Details  Name: Shawn Andrews MRN: [id number] Date of Birth: [birth year] Referring Provider:   Flowsheet Row Cardiac Rehab from 04/11/2022 in [facility] Cardiac and Pulmonary Rehab  Referring Provider Dr. Dorothyann Peng, MD       Encounter Date: 05/24/2022  Check In:  Session Check In - 05/24/22 1000       Check-In   Supervising physician immediately available to respond to emergencies See telemetry face sheet for immediately available ER MD    Location [facility]-Cardiac & Pulmonary Rehab    Staff Present Cora Collum, RN, BSN, CCRP;Jessica [place], MA, RCEP, CCRP, CCET;Noah Tickle, BS, Exercise Physiologist    Virtual Visit No    Medication changes reported     No    Fall or balance concerns reported    No    Warm-up and Cool-down Performed on first and last piece of equipment    Resistance Training Performed Yes    VAD Patient? No    PAD/SET Patient? No      Pain Assessment   Currently in Pain? No/denies                Social History   Tobacco Use  Smoking Status Former   Packs/day: 0.00   Years: 0.00   Additional pack years: 0.00   Total pack years: 0.00   Types: Pipe, Cigarettes   Quit date: 12/26/2000   Years since quitting: 21.4   Passive exposure: Past  Smokeless Tobacco Current   Types: Chew  Tobacco Comments   quit over 30 years ago    Goals Met:  Independence with exercise equipment Exercise tolerated well No report of concerns or symptoms today  Goals Unmet:  Not Applicable  Comments: Pt able to follow exercise prescription today without complaint.  Will continue to monitor for progression.    Dr. Bethann Punches is Medical Director for [facility] Cardiac Rehabilitation.  Dr. Vida Rigger is Medical Director for [facility] Pulmonary Rehabilitation.

## 2022-05-27 ENCOUNTER — Encounter: Payer: Medicare Other | Admitting: *Deleted

## 2022-05-27 DIAGNOSIS — Z955 Presence of coronary angioplasty implant and graft: Secondary | ICD-10-CM

## 2022-05-27 DIAGNOSIS — I214 Non-ST elevation (NSTEMI) myocardial infarction: Secondary | ICD-10-CM | POA: Diagnosis not present

## 2022-05-27 NOTE — Progress Notes (Signed)
Daily Session Note  Patient Details  Name: Shawn Andrews MRN: [id number] Date of Birth: [birth year] Referring Provider:   Flowsheet Row Cardiac Rehab from 04/11/2022 in [facility] Cardiac and Pulmonary Rehab  Referring Provider Dr. Dorothyann Peng, MD       Encounter Date: 05/27/2022  Check In:  Session Check In - 05/27/22 0922       Check-In   Supervising physician immediately available to respond to emergencies See telemetry face sheet for immediately available ER MD    Location [facility]-Cardiac & Pulmonary Rehab    Staff Present Lanny Hurst, RN, Franki Monte, BS, ACSM CEP, Exercise Physiologist;Noah Tickle, BS, Exercise Physiologist    Virtual Visit No    Medication changes reported     No    Fall or balance concerns reported    No    Warm-up and Cool-down Performed on first and last piece of equipment    Resistance Training Performed Yes    VAD Patient? No    PAD/SET Patient? No      Pain Assessment   Currently in Pain? No/denies                Social History   Tobacco Use  Smoking Status Former   Packs/day: 0.00   Years: 0.00   Additional pack years: 0.00   Total pack years: 0.00   Types: Pipe, Cigarettes   Quit date: 12/26/2000   Years since quitting: 21.4   Passive exposure: Past  Smokeless Tobacco Current   Types: Chew  Tobacco Comments   quit over 30 years ago    Goals Met:  Independence with exercise equipment Exercise tolerated well No report of concerns or symptoms today Strength training completed today  Goals Unmet:  Not Applicable  Comments: Pt able to follow exercise prescription today without complaint.  Will continue to monitor for progression.    Dr. Bethann Punches is Medical Director for [facility] Cardiac Rehabilitation.  Dr. Vida Rigger is Medical Director for [facility] Pulmonary Rehabilitation.

## 2022-05-28 ENCOUNTER — Ambulatory Visit: Payer: Medicare Other | Admitting: Urology

## 2022-05-29 ENCOUNTER — Encounter: Payer: Medicare Other | Admitting: *Deleted

## 2022-05-29 DIAGNOSIS — I214 Non-ST elevation (NSTEMI) myocardial infarction: Secondary | ICD-10-CM

## 2022-05-29 DIAGNOSIS — Z955 Presence of coronary angioplasty implant and graft: Secondary | ICD-10-CM

## 2022-05-29 NOTE — Progress Notes (Signed)
Daily Session Note  Patient Details  Name: Shawn Andrews MRN: [id number] Date of Birth: Sep 04, 1928 Referring Provider:   Flowsheet Row Cardiac Rehab from 04/11/2022 in [facility] Cardiac and Pulmonary Rehab  Referring Provider Dr. Dorothyann Peng, MD       Encounter Date: 05/29/2022  Check In:  Session Check In - 05/29/22 0915       Check-In   Supervising physician immediately available to respond to emergencies See telemetry face sheet for immediately available ER MD    Location [facility]-Cardiac & Pulmonary Rehab    Staff Present Lanny Hurst, RN, ADN;Laureen Manson [place], BS, RRT, CPFT;Noah Tickle, BS, Exercise Physiologist    Virtual Visit No    Medication changes reported     No    Fall or balance concerns reported    No    Warm-up and Cool-down Performed on first and last piece of equipment    Resistance Training Performed Yes    VAD Patient? No    PAD/SET Patient? No      Pain Assessment   Currently in Pain? No/denies                Social History   Tobacco Use  Smoking Status Former   Packs/day: 0.00   Years: 0.00   Additional pack years: 0.00   Total pack years: 0.00   Types: Pipe, Cigarettes   Quit date: 12/26/2000   Years since quitting: 21.4   Passive exposure: Past  Smokeless Tobacco Current   Types: Chew  Tobacco Comments   quit over 30 years ago    Goals Met:  Independence with exercise equipment Exercise tolerated well No report of concerns or symptoms today Strength training completed today  Goals Unmet:  Not Applicable  Comments: Pt able to follow exercise prescription today without complaint.  Will continue to monitor for progression.    Dr. Bethann Punches is Medical Director for [facility] Cardiac Rehabilitation.  Dr. Vida Rigger is Medical Director for [facility] Pulmonary Rehabilitation.

## 2022-05-29 NOTE — Progress Notes (Unsigned)
Error

## 2022-05-30 ENCOUNTER — Ambulatory Visit: Payer: Medicare Other | Admitting: Urology

## 2022-05-30 DIAGNOSIS — R339 Retention of urine, unspecified: Secondary | ICD-10-CM

## 2022-05-30 DIAGNOSIS — Z9359 Other cystostomy status: Secondary | ICD-10-CM

## 2022-05-30 NOTE — Progress Notes (Signed)
Cath Change/ Replacement  Patient is present today for a catheter change due to urinary retention.  10ml of water was removed from the balloon, a 16FR foley cath was removed without difficulty.  Patient was cleaned and prepped in a sterile fashion with betadine  A 16 FR foley cath was replaced into the bladder, no complications were noted. Urine return was noted 50 ml and urine was clear yellow  in color. The balloon was filled with 10ml of sterile water. A leg bag was attached for drainage.  A night bag was also given to the patient and patient was given instruction on how to change from one bag to another. Patient was given proper instruction on catheter care  Performed by: Mylik Pro Magallon-Mariche, RMA  Follow up: No follow-ups on file.   Follow up 6 weeks

## 2022-05-31 ENCOUNTER — Encounter: Payer: Medicare Other | Admitting: *Deleted

## 2022-05-31 DIAGNOSIS — I214 Non-ST elevation (NSTEMI) myocardial infarction: Secondary | ICD-10-CM | POA: Diagnosis not present

## 2022-05-31 DIAGNOSIS — Z955 Presence of coronary angioplasty implant and graft: Secondary | ICD-10-CM

## 2022-05-31 NOTE — Progress Notes (Signed)
Daily Session Note  Patient Details  Name: Shawn Andrews MRN: [id number] Date of Birth: [birth year] Referring Provider:   Flowsheet Row Cardiac Rehab from 04/11/2022 in [facility] Cardiac and Pulmonary Rehab  Referring Provider Dr. Dorothyann Peng, MD       Encounter Date: 05/31/2022  Check In:  Session Check In - 05/31/22 1007       Check-In   Supervising physician immediately available to respond to emergencies See telemetry face sheet for immediately available ER MD    Location [facility]-Cardiac & Pulmonary Rehab    Staff Present Cora Collum, RN, BSN, CCRP;Jessica [place], MA, RCEP, CCRP, CCET;Joseph [place], [place]    Virtual Visit No    Medication changes reported     No    Fall or balance concerns reported    No    Warm-up and Cool-down Performed on first and last piece of equipment    Resistance Training Performed Yes    VAD Patient? No    PAD/SET Patient? No      Pain Assessment   Currently in Pain? No/denies                Social History   Tobacco Use  Smoking Status Former   Packs/day: 0.00   Years: 0.00   Additional pack years: 0.00   Total pack years: 0.00   Types: Pipe, Cigarettes   Quit date: 12/26/2000   Years since quitting: 21.4   Passive exposure: Past  Smokeless Tobacco Current   Types: Chew  Tobacco Comments   quit over 30 years ago    Goals Met:  Independence with exercise equipment Exercise tolerated well No report of concerns or symptoms today  Goals Unmet:  Not Applicable  Comments: Pt able to follow exercise prescription today without complaint.  Will continue to monitor for progression.    Dr. Bethann Punches is Medical Director for [facility] Cardiac Rehabilitation.  Dr. Vida Rigger is Medical Director for [facility] Pulmonary Rehabilitation.

## 2022-06-03 ENCOUNTER — Encounter: Payer: Medicare Other | Admitting: *Deleted

## 2022-06-03 DIAGNOSIS — I214 Non-ST elevation (NSTEMI) myocardial infarction: Secondary | ICD-10-CM

## 2022-06-03 DIAGNOSIS — Z955 Presence of coronary angioplasty implant and graft: Secondary | ICD-10-CM

## 2022-06-03 NOTE — Progress Notes (Signed)
Daily Session Note  Patient Details  Name: Shawn Andrews MRN: [id number] Date of Birth: 1928-08-04 Referring Provider:   Flowsheet Row Cardiac Rehab from 04/11/2022 in [facility] Cardiac and Pulmonary Rehab  Referring Provider Dr. Dorothyann Peng, MD       Encounter Date: 06/03/2022  Check In:  Session Check In - 06/03/22 0916       Check-In   Supervising physician immediately available to respond to emergencies See telemetry face sheet for immediately available ER MD    Location [facility]-Cardiac & Pulmonary Rehab    Staff Present Lanny Hurst, RN, Franki Monte, BS, ACSM CEP, Exercise Physiologist;Noah Tickle, BS, Exercise Physiologist    Virtual Visit No    Medication changes reported     No    Fall or balance concerns reported    No    Warm-up and Cool-down Performed on first and last piece of equipment    Resistance Training Performed Yes    VAD Patient? No    PAD/SET Patient? No      Pain Assessment   Currently in Pain? No/denies                Social History   Tobacco Use  Smoking Status Former   Packs/day: 0.00   Years: 0.00   Additional pack years: 0.00   Total pack years: 0.00   Types: Pipe, Cigarettes   Quit date: 12/26/2000   Years since quitting: 21.4   Passive exposure: Past  Smokeless Tobacco Current   Types: Chew  Tobacco Comments   quit over 30 years ago    Goals Met:  Independence with exercise equipment Exercise tolerated well No report of concerns or symptoms today Strength training completed today  Goals Unmet:  Not Applicable  Comments: Pt able to follow exercise prescription today without complaint.  Will continue to monitor for progression.    Dr. Bethann Punches is Medical Director for [facility] Cardiac Rehabilitation.  Dr. Vida Rigger is Medical Director for [facility] Pulmonary Rehabilitation.

## 2022-06-05 ENCOUNTER — Encounter: Payer: Medicare Other | Admitting: *Deleted

## 2022-06-05 ENCOUNTER — Encounter: Payer: Self-pay | Admitting: *Deleted

## 2022-06-05 DIAGNOSIS — I214 Non-ST elevation (NSTEMI) myocardial infarction: Secondary | ICD-10-CM

## 2022-06-05 DIAGNOSIS — Z955 Presence of coronary angioplasty implant and graft: Secondary | ICD-10-CM

## 2022-06-05 NOTE — Progress Notes (Signed)
Cardiac Individual Treatment Plan  Patient Details  Name: Shawn Andrews MRN: [id number] Date of Birth: 1928-10-14 Referring Provider:   Flowsheet Row Cardiac Rehab from 04/11/2022 in [facility] Cardiac and Pulmonary Rehab  Referring Provider Dr. Lujean Amel, MD       Initial Encounter Date:  Flowsheet Row Cardiac Rehab from 04/11/2022 in [facility] Cardiac and Pulmonary Rehab  Date 04/11/22       Visit Diagnosis: NSTEMI (non-ST elevated myocardial infarction) [facility])  Status post coronary artery stent placement  Patient's Home Medications on Admission:  Current Outpatient Medications:    acetaminophen (TYLENOL 8 HOUR) 650 MG CR tablet, Take 1 tablet (650 mg total) by mouth 2 (two) times a day., Disp: , Rfl:    aspirin 81 MG chewable tablet, Chew 1 tablet by mouth daily., Disp: , Rfl:    Cholecalciferol (VITAMIN D3) 25 MCG (1000 UT) CAPS, Take 1 capsule (1,000 Units total) by mouth daily., Disp: 30 capsule, Rfl:    clopidogrel (PLAVIX) 75 MG tablet, Take 75 mg by mouth daily., Disp: , Rfl:    cyanocobalamin 1000 MCG tablet, Take 1,000 mcg by mouth daily., Disp: , Rfl:    docusate sodium (COLACE) 100 MG capsule, Take 1 capsule (100 mg total) by mouth daily., Disp: , Rfl:    famotidine (PEPCID) 20 MG tablet, TAKE 1 TABLET BY MOUTH AT  BEDTIME (Patient taking differently: Take 20 mg by mouth at bedtime.), Disp: 90 tablet, Rfl: 3   finasteride (PROSCAR) 5 MG tablet, TAKE 1 TABLET BY MOUTH  DAILY (Patient taking differently: Take 5 mg by mouth daily.), Disp: 90 tablet, Rfl: 3   furosemide (LASIX) 20 MG tablet, Take 1 tablet by mouth every other day., Disp: , Rfl:    hydrALAZINE (APRESOLINE) 50 MG tablet, Take 1 tablet (50 mg total) by mouth every 8 (eight) hours., Disp: 90 tablet, Rfl: 0   isosorbide mononitrate (IMDUR) 60 MG 24 hr tablet, Take 1 tablet (60 mg total) by mouth 2 (two) times daily., Disp: 60 tablet, Rfl: 1   loratadine (CLARITIN) 10 MG tablet, Take 1 tablet (10 mg total) by mouth  daily as needed for allergies., Disp: 30 tablet, Rfl:    metoprolol tartrate (LOPRESSOR) 50 MG tablet, Take 0.5 tablets (25 mg total) by mouth 2 (two) times daily., Disp: , Rfl:    nitroGLYCERIN (NITROSTAT) 0.4 MG SL tablet, Place 1 tablet (0.4 mg total) under the tongue every 5 (five) minutes x 3 doses as needed for chest pain., Disp: 30 tablet, Rfl: 12   pantoprazole (PROTONIX) 40 MG tablet, Take 40 mg by mouth 2 (two) times daily., Disp: , Rfl:    polyethylene glycol powder (GLYCOLAX/MIRALAX) 17 GM/SCOOP powder, Take 8.5-17 g by mouth daily as needed for moderate constipation. (Patient not taking: Reported on 04/08/2022), Disp: 3350 g, Rfl: 0   primidone (MYSOLINE) 50 MG tablet, Take 2 tablets (100 mg total) by mouth 3 (three) times daily., Disp: , Rfl:    rosuvastatin (CRESTOR) 40 MG tablet, Take 1 tablet (40 mg total) by mouth at bedtime., Disp: 30 tablet, Rfl: 2  Past Medical History: Past Medical History:  Diagnosis Date   Abnormal drug screen 06/22/2014   inapprop neg xanax rpt 3 mo (06/2014)   BPH (benign prostatic hypertrophy) 01/21/1998   has had 3 biopsies in past ([facility]) decided to stop PSA/DRE   CHF (congestive heart failure) (HCC)    Coronary artery disease    Hyperlipidemia 01/21/2002   Hypertension 05/22/2003   Left lumbar radiculopathy  Osteoarthritis 01/22/1988   knees, lumbar spondylosis and listhesis    Tobacco Use: Social History   Tobacco Use  Smoking Status Former   Packs/day: 0.00   Years: 0.00   Additional pack years: 0.00   Total pack years: 0.00   Types: Pipe, Cigarettes   Quit date: 12/26/2000   Years since quitting: 21.4   Passive exposure: Past  Smokeless Tobacco Current   Types: Chew  Tobacco Comments   quit over 30 years ago    Labs: Review Flowsheet  More data exists      Latest Ref Rng & Units 08/18/2018 08/25/2019 08/25/2020 01/17/2021 01/18/2021  Labs for ITP Cardiac and Pulmonary Rehab  Cholestrol 0 - 200 mg/dL 161  096  045  - 409    LDL (calc) 0 - 99 mg/dL 811  914  782  - 99   HDL-C >40 mg/dL 95.62  13.08  65.78  - 45   Trlycerides <150 mg/dL 46.9  629.5  284.1  - 66   Hemoglobin A1c 4.8 - 5.6 % - - - 5.0  -     Exercise Target Goals: Exercise Program Goal: Individual exercise prescription set using results from initial 6 min walk test and THRR while considering  patient's activity barriers and safety.   Exercise Prescription Goal: Initial exercise prescription builds to 30-45 minutes a day of aerobic activity, 2-3 days per week.  Home exercise guidelines will be given to patient during program as part of exercise prescription that the participant will acknowledge.   Education: Aerobic Exercise: - Group verbal and visual presentation on the components of exercise prescription. Introduces F.I.T.T principle from ACSM for exercise prescriptions.  Reviews F.I.T.T. principles of aerobic exercise including progression. Written material given at graduation. Flowsheet Row Cardiac Rehab from 05/01/2022 in [facility] Cardiac and Pulmonary Rehab  Education need identified 04/11/22       Education: Resistance Exercise: - Group verbal and visual presentation on the components of exercise prescription. Introduces F.I.T.T principle from ACSM for exercise prescriptions  Reviews F.I.T.T. principles of resistance exercise including progression. Written material given at graduation. Flowsheet Row Cardiac Rehab from 09/13/2021 in [facility] Cardiac and Pulmonary Rehab  Date 08/09/21  Educator [facility]  Instruction Review Code 1- Verbalizes Understanding        Education: Exercise & Equipment Safety: - Individual verbal instruction and demonstration of equipment use and safety with use of the equipment. Flowsheet Row Cardiac Rehab from 05/01/2022 in [facility] Cardiac and Pulmonary Rehab  Education need identified 04/08/22  Date 04/08/22  Educator [facility]  Instruction Review Code 1- Verbalizes Understanding       Education: Exercise Physiology &  General Exercise Guidelines: - Group verbal and written instruction with models to review the exercise physiology of the cardiovascular system and associated critical values. Provides general exercise guidelines with specific guidelines to those with heart or lung disease.  Flowsheet Row Cardiac Rehab from 09/13/2021 in [facility] Cardiac and Pulmonary Rehab  Education need identified 04/03/21       Education: Flexibility, Balance, Mind/Body Relaxation: - Group verbal and visual presentation with interactive activity on the components of exercise prescription. Introduces F.I.T.T principle from ACSM for exercise prescriptions. Reviews F.I.T.T. principles of flexibility and balance exercise training including progression. Also discusses the mind body connection.  Reviews various relaxation techniques to help reduce and manage stress (i.e. Deep breathing, progressive muscle relaxation, and visualization). Balance handout provided to take home. Written material given at graduation. Flowsheet Row Cardiac Rehab from 09/13/2021 in [facility] Cardiac  and Pulmonary Rehab  Date 08/09/21  Educator [facility]  Instruction Review Code 1- Verbalizes Understanding       Activity Barriers & Risk Stratification:  Activity Barriers & Cardiac Risk Stratification - 04/11/22 1332       Activity Barriers & Cardiac Risk Stratification   Activity Barriers Decreased Ventricular Function;Joint Problems    Comments Suprapubic catheter- reports no limitation or problems with movement or activity, R Shoulder pain, L hip pain    Cardiac Risk Stratification High             6 Minute Walk:  6 Minute Walk     Row Name 04/11/22 1327         6 Minute Walk   Phase Initial     Distance 910 feet     Walk Time 6 minutes     # of Rest Breaks 0     MPH 1.72     METS 1.2     RPE 11     Perceived Dyspnea  1     VO2 Peak 4.19     Symptoms Yes (comment)     Comments hip pain     Resting HR 57 bpm     Resting BP 138/60      Resting Oxygen Saturation  97 %     Exercise Oxygen Saturation  during 6 min walk 96 %     Max Ex. HR 82 bpm     Max Ex. BP 152/54     2 Minute Post BP 142/52              Oxygen Initial Assessment:   Oxygen Re-Evaluation:   Oxygen Discharge (Final Oxygen Re-Evaluation):   Initial Exercise Prescription:  Initial Exercise Prescription - 04/11/22 1300       Date of Initial Exercise RX and Referring Provider   Date 04/11/22    Referring Provider Dr. Dorothyann Peng, MD      Oxygen   Maintain Oxygen Saturation 88% or higher      Recumbant Bike   Level 2    Watts 15    Minutes 15    METs 1.2      NuStep   Level 2    Minutes 15    METs 1.2      Track   Laps 20    Minutes 15    METs 2.09      Prescription Details   Frequency (times per week) 2    Duration Progress to 30 minutes of continuous aerobic without signs/symptoms of physical distress      Intensity   THRR 40-80% of Max Heartrate 85-115    Ratings of Perceived Exertion 11-13    Perceived Dyspnea 0-4      Progression   Progression Continue to progress workloads to maintain intensity without signs/symptoms of physical distress.      Resistance Training   Training Prescription Yes    Weight 4 lb    Reps 10-15             Perform Capillary Blood Glucose checks as needed.  Exercise Prescription Changes:   Exercise Prescription Changes     Row Name 04/11/22 1300 04/22/22 1400 05/01/22 1000 05/06/22 1100 05/20/22 1400     Response to Exercise   Blood Pressure (Admit) 138/60 108/54 -- 112/62 120/55   Blood Pressure (Exercise) 152/54 130/68 -- -- 154/60   Blood Pressure (Exit) 142/52 116/68 -- 100/60 108/54   Heart Rate (Admit) 57 bpm  57 bpm -- 60 bpm 57 bpm   Heart Rate (Exercise) 82 bpm 83 bpm -- 97 bpm 94 bpm   Heart Rate (Exit) 60 bpm 65 bpm -- 61 bpm 66 bpm   Oxygen Saturation (Admit) 97 % -- -- -- --   Oxygen Saturation (Exercise) 96 % -- -- -- --   Rating of Perceived Exertion  (Exercise) 11 13 -- 13 13   Perceived Dyspnea (Exercise) 1 -- -- -- --   Symptoms hip pain none -- none none   Comments Results First two days of exercise -- -- --   Duration -- Continue with 30 min of aerobic exercise without signs/symptoms of physical distress. -- Continue with 30 min of aerobic exercise without signs/symptoms of physical distress. Continue with 30 min of aerobic exercise without signs/symptoms of physical distress.   Intensity -- THRR unchanged -- THRR unchanged THRR unchanged     Progression   Progression -- Continue to progress workloads to maintain intensity without signs/symptoms of physical distress. -- Continue to progress workloads to maintain intensity without signs/symptoms of physical distress. Continue to progress workloads to maintain intensity without signs/symptoms of physical distress.   Average METs -- 2.8 -- 2.58 2.31     Resistance Training   Training Prescription -- Yes -- Yes Yes   Weight -- 4 lb -- 4 lb 4 lb   Reps -- 10-15 -- 10-15 10-15     Interval Training   Interval Training -- No -- No No     Recumbant Bike   Level -- 2 -- 3 --   Watts -- 15 -- 15 --   Minutes -- 15 -- 15 --   METs -- 3.1 -- 2.57 --     NuStep   Level -- 5 -- 5 5   Minutes -- 15 -- 15 15   METs -- 3.3 -- 3 3.4     T5 Nustep   Level -- -- -- 4 3   Minutes -- -- -- 15 15   METs -- -- -- 1.9 1.9     Biostep-RELP   Level -- -- -- 4 --   Minutes -- -- -- 15 --   METs -- -- -- 3 --     Track   Laps -- 13 -- 25 22   Minutes -- 10 -- 15 15   METs -- 1.71 -- 2.36 2.2     Home Exercise Plan   Plans to continue exercise at -- -- Home (comment)  walking Home (comment)  walking Home (comment)  walking   Frequency -- -- Add 3 additional days to program exercise sessions. Add 3 additional days to program exercise sessions. Add 3 additional days to program exercise sessions.   Initial Home Exercises Provided -- -- 05/01/22 05/01/22 05/01/22     Oxygen   Maintain  Oxygen Saturation -- 88% or higher -- 88% or higher 88% or higher    Row Name 06/03/22 1000             Response to Exercise   Blood Pressure (Admit) 104/60       Blood Pressure (Exit) 96/58       Heart Rate (Admit) 57 bpm       Heart Rate (Exercise) 91 bpm       Heart Rate (Exit) 56 bpm       Rating of Perceived Exertion (Exercise) 15       Symptoms none  Duration Continue with 30 min of aerobic exercise without signs/symptoms of physical distress.       Intensity THRR unchanged         Progression   Progression Continue to progress workloads to maintain intensity without signs/symptoms of physical distress.       Average METs 2.29         Resistance Training   Training Prescription Yes       Weight 4 lb       Reps 10-15         Interval Training   Interval Training No         Recumbant Bike   Level 4       Minutes 15       METs 2.57         NuStep   Level 4       Minutes 15       METs 2.5         REL-XR   Level 1       Minutes 15         Biostep-RELP   Level 4       Minutes 15       METs 3         Track   Laps 23       Minutes 15       METs 2.25         Home Exercise Plan   Plans to continue exercise at Home (comment)  walking       Frequency Add 3 additional days to program exercise sessions.       Initial Home Exercises Provided 05/01/22         Oxygen   Maintain Oxygen Saturation 88% or higher                Exercise Comments:   Exercise Comments     Row Name 04/15/22 4098           Exercise Comments First full day of exercise!  Patient was oriented to gym and equipment including functions, settings, policies, and procedures.  Patient's individual exercise prescription and treatment plan were reviewed.  All starting workloads were established based on the results of the 6 minute walk test done at initial orientation visit.  The plan for exercise progression was also introduced and progression will be customized based on patient's  performance and goals.                Exercise Goals and Review:   Exercise Goals     Row Name 04/11/22 1334             Exercise Goals   Increase Physical Activity Yes       Intervention Provide advice, education, support and counseling about physical activity/exercise needs.;Develop an individualized exercise prescription for aerobic and resistive training based on initial evaluation findings, risk stratification, comorbidities and participant's personal goals.       Expected Outcomes Short Term: Attend rehab on a regular basis to increase amount of physical activity.;Long Term: Add in home exercise to make exercise part of routine and to increase amount of physical activity.;Long Term: Exercising regularly at least 3-5 days a week.       Increase Strength and Stamina Yes       Intervention Provide advice, education, support and counseling about physical activity/exercise needs.;Develop an individualized exercise prescription for aerobic and resistive training based on initial evaluation findings, risk stratification, comorbidities  and participant's personal goals.       Expected Outcomes Short Term: Increase workloads from initial exercise prescription for resistance, speed, and METs.;Short Term: Perform resistance training exercises routinely during rehab and add in resistance training at home;Long Term: Improve cardiorespiratory fitness, muscular endurance and strength as measured by increased METs and functional capacity ( )       Able to understand and use rate of perceived exertion (RPE) scale Yes       Intervention Provide education and explanation on how to use RPE scale       Expected Outcomes Short Term: Able to use RPE daily in rehab to express subjective intensity level;Long Term:  Able to use RPE to guide intensity level when exercising independently       Able to understand and use Dyspnea scale Yes       Intervention Provide education and explanation on how to use  Dyspnea scale       Expected Outcomes Short Term: Able to use Dyspnea scale daily in rehab to express subjective sense of shortness of breath during exertion;Long Term: Able to use Dyspnea scale to guide intensity level when exercising independently       Knowledge and understanding of Target Heart Rate Range (THRR) Yes       Intervention Provide education and explanation of THRR including how the numbers were predicted and where they are located for reference       Expected Outcomes Short Term: Able to use daily as guideline for intensity in rehab;Short Term: Able to state/look up THRR;Long Term: Able to use THRR to govern intensity when exercising independently       Able to check pulse independently Yes       Intervention Provide education and demonstration on how to check pulse in carotid and radial arteries.;Review the importance of being able to check your own pulse for safety during independent exercise       Expected Outcomes Short Term: Able to explain why pulse checking is important during independent exercise;Long Term: Able to check pulse independently and accurately       Understanding of Exercise Prescription Yes       Intervention Provide education, explanation, and written materials on patient's individual exercise prescription       Expected Outcomes Short Term: Able to explain program exercise prescription;Long Term: Able to explain home exercise prescription to exercise independently                Exercise Goals Re-Evaluation :  Exercise Goals Re-Evaluation     Row Name 04/15/22 1610 04/22/22 1432 05/01/22 1011 05/06/22 1156 05/20/22 1002     Exercise Goal Re-Evaluation   Exercise Goals Review Able to understand and use rate of perceived exertion (RPE) scale;Able to understand and use Dyspnea scale;Knowledge and understanding of Target Heart Rate Range (THRR);Understanding of Exercise Prescription Increase Physical Activity;Increase Strength and Stamina;Understanding of  Exercise Prescription Increase Physical Activity;Increase Strength and Stamina;Understanding of Exercise Prescription;Able to understand and use rate of perceived exertion (RPE) scale;Able to understand and use Dyspnea scale;Knowledge and understanding of Target Heart Rate Range (THRR);Able to check pulse independently Increase Physical Activity;Increase Strength and Stamina;Understanding of Exercise Prescription Increase Physical Activity;Increase Strength and Stamina;Understanding of Exercise Prescription   Comments Reviewed RPE scale, THR and program prescription with pt today.  Pt voiced understanding and was given a copy of goals to take home. Toure is off to a good start in the program. During his first two sessions of rehab he  was able to work at level 5 on the T4 nustep and level 2 on the recumbent bike. He also was able to walk 13 laps on the track in 10 minutes. We will continue to monitor his progress in the program. Reviewed home exercise with pt today.  Pt plans to walk at home for exercise.  Reviewed THR, pulse, RPE, sign and symptoms, pulse oximetery and when to call 911 or MD.  Also discussed weather considerations and indoor options.  Pt voiced understanding. Montie continues to do well in rehab. He increased to 25 laps on the track which is his highest yet. He also incresed to level 3 on the recumbent bike, we hope to see his watts improve over time. He is doing well at level 5 on the T4 Nustep and  reaches his THR some sessions. Will continue to monitor. Rayvion reports walking at home for exercise and stays active around the house as well as in his garden.   Expected Outcomes Short: Use RPE daily to regulate intensity. Long: Follow program prescription in THR. Short: Continue to follow current exercise prescription. Long: Continue to improve strength and stamina. Short: Start to walk again at home Long: Continue to exercise independently Short: Increase watts on recumbent bike Long: Continue to  increase overall MET level and stamina Short: continue to walk at home Long: Continue to exercise independently    Row Name 05/20/22 1447 06/03/22 1019           Exercise Goal Re-Evaluation   Exercise Goals Review Increase Physical Activity;Increase Strength and Stamina;Understanding of Exercise Prescription Increase Physical Activity;Increase Strength and Stamina;Understanding of Exercise Prescription      Comments Troyce is doing well in rehab. He has continued to consistently walk above 20 laps on the track. He also has continued to work at level 3 on the T5 nustep and level 5 on the T4 nustep. He has done well with 4 lb hand weights for resistance training as well. We will continue to monitor his progress. Javarian continues to do well in rehab. He ranges walking between 20-25 laps on the track, our goal is for him to reach up to 30 overall. He did increase to level 4 on the recumbent bike and tried out the XR for the first time at level 1. He could benefit from increasing his level on the XR as he appears to be at level 4 on other seated machines, such as both the T4 and Biostep . He does reach his THR most sessions. We will continue to monitor.      Expected Outcomes Short: Continue to push for more laps on the track. Long: Continue to improve strength and stamina. Short: Increase level on XR to 2, strive to hit 30 laps on track Long: Continue to increase overall MET level and stamina               Discharge Exercise Prescription (Final Exercise Prescription Changes):  Exercise Prescription Changes - 06/03/22 1000       Response to Exercise   Blood Pressure (Admit) 104/60    Blood Pressure (Exit) 96/58    Heart Rate (Admit) 57 bpm    Heart Rate (Exercise) 91 bpm    Heart Rate (Exit) 56 bpm    Rating of Perceived Exertion (Exercise) 15    Symptoms none    Duration Continue with 30 min of aerobic exercise without signs/symptoms of physical distress.    Intensity THRR unchanged  Progression   Progression Continue to progress workloads to maintain intensity without signs/symptoms of physical distress.    Average METs 2.29      Resistance Training   Training Prescription Yes    Weight 4 lb    Reps 10-15      Interval Training   Interval Training No      Recumbant Bike   Level 4    Minutes 15    METs 2.57      NuStep   Level 4    Minutes 15    METs 2.5      REL-XR   Level 1    Minutes 15      Biostep-RELP   Level 4    Minutes 15    METs 3      Track   Laps 23    Minutes 15    METs 2.25      Home Exercise Plan   Plans to continue exercise at Home (comment)   walking   Frequency Add 3 additional days to program exercise sessions.    Initial Home Exercises Provided 05/01/22      Oxygen   Maintain Oxygen Saturation 88% or higher             Nutrition:  Target Goals: Understanding of nutrition guidelines, daily intake of sodium 1500mg , cholesterol 200mg , calories 30% from fat and 7% or less from saturated fats, daily to have 5 or more servings of fruits and vegetables.  Education: All About Nutrition: -Group instruction provided by verbal, written material, interactive activities, discussions, models, and posters to present general guidelines for heart healthy nutrition including fat, fiber, MyPlate, the role of sodium in heart healthy nutrition, utilization of the nutrition label, and utilization of this knowledge for meal planning. Follow up email sent as well. Written material given at graduation. Flowsheet Row Cardiac Rehab from 05/01/2022 in [facility] Cardiac and Pulmonary Rehab  Education need identified 04/11/22       Biometrics:  Pre Biometrics - 04/11/22 1335       Pre Biometrics   Height 5' 10.5" (1.791 m)    Weight 181 lb 12.8 oz (82.5 kg)    Waist Circumference 38 inches    Hip Circumference 40 inches    Waist to Hip Ratio 0.95 %    BMI (Calculated) 25.71    Single Leg Stand 3.4 seconds              Nutrition  Therapy Plan and Nutrition Goals:  Nutrition Therapy & Goals - 04/11/22 1108       Intervention Plan   Intervention Prescribe, educate and counsel regarding individualized specific dietary modifications aiming towards targeted core components such as weight, hypertension, lipid management, diabetes, heart failure and other comorbidities.    Expected Outcomes Short Term Goal: Understand basic principles of dietary content, such as calories, fat, sodium, cholesterol and nutrients.;Short Term Goal: A plan has been developed with personal nutrition goals set during dietitian appointment.;Long Term Goal: Adherence to prescribed nutrition plan.             Nutrition Assessments:  MEDIFICTS Score Key: ?70 Need to make dietary changes  40-70 Heart Healthy Diet ? 40 Therapeutic Level Cholesterol Diet  Flowsheet Row Cardiac Rehab from 04/11/2022 in [facility] Cardiac and Pulmonary Rehab  Picture Your Plate Total Score on Admission 86      Picture Your Plate Scores: <40 Unhealthy dietary pattern with much room for improvement. 41-50 Dietary pattern unlikely to meet recommendations  for good health and room for improvement. 51-60 More healthful dietary pattern, with some room for improvement.  >60 Healthy dietary pattern, although there may be some specific behaviors that could be improved.    Nutrition Goals Re-Evaluation:  Nutrition Goals Re-Evaluation     Row Name 05/01/22 0950 05/20/22 0955           Goals   Current Weight 182 lb (82.6 kg) --      Nutrition Goal Continue to eat heart healthy Short: continue with heart healthy eating patterns Long: maintain heart healthy diet independently      Comment Miroslav does not want to meet with RD. He has a good hold on his diet, eats lots of fruits and does not over eat. Jamauri reports doing well with his nutrition. He reports that his wife takes care of him and has him on a low salt diet and continues to choose heart healthy foods. He would not like  to meet with the dietitian at this time and reports no questions or concerns at this time.      Expected Outcome Short: continue heart healthy diet. Long: adhere to his heart healthy diet. Short: continue with heart healthy eating patterns Long: maintain heart healthy diet independently               Nutrition Goals Discharge (Final Nutrition Goals Re-Evaluation):  Nutrition Goals Re-Evaluation - 05/20/22 0955       Goals   Nutrition Goal Short: continue with heart healthy eating patterns Long: maintain heart healthy diet independently    Comment Rishi reports doing well with his nutrition. He reports that his wife takes care of him and has him on a low salt diet and continues to choose heart healthy foods. He would not like to meet with the dietitian at this time and reports no questions or concerns at this time.    Expected Outcome Short: continue with heart healthy eating patterns Long: maintain heart healthy diet independently             Psychosocial: Target Goals: Acknowledge presence or absence of significant depression and/or stress, maximize coping skills, provide positive support system. Participant is able to verbalize types and ability to use techniques and skills needed for reducing stress and depression.   Education: Stress, Anxiety, and Depression - Group verbal and visual presentation to define topics covered.  Reviews how body is impacted by stress, anxiety, and depression.  Also discusses healthy ways to reduce stress and to treat/manage anxiety and depression.  Written material given at graduation. Flowsheet Row Cardiac Rehab from 09/13/2021 in [facility] Cardiac and Pulmonary Rehab  Date 09/13/21  Educator [facility]  Instruction Review Code 1- [employer] Understanding       Education: Sleep Hygiene -Provides group verbal and written instruction about how sleep can affect your health.  Define sleep hygiene, discuss sleep cycles and impact of sleep habits. Review good sleep  hygiene tips.    Initial Review & Psychosocial Screening:  Initial Psych Review & Screening - 04/08/22 0937       Initial Review   Current issues with None Identified      Family Dynamics   Good Support System? Yes    Comments Soroush states that he has no issues with depression or anxiets and does not take anything for his mood. He can look to his wife, son, daughter and grandaughter for support.      Barriers   Psychosocial barriers to participate in program There are no  identifiable barriers or psychosocial needs.;The patient should benefit from training in stress management and relaxation.      Screening Interventions   Interventions Encouraged to exercise;To provide support and resources with identified psychosocial needs;Provide feedback about the scores to participant    Expected Outcomes Long Term Goal: Stressors or current issues are controlled or eliminated.;Short Term goal: Utilizing psychosocial counselor, staff and physician to assist with identification of specific Stressors or current issues interfering with healing process. Setting desired goal for each stressor or current issue identified.;Short Term goal: Identification and review with participant of any Quality of Life or Depression concerns found by scoring the questionnaire.;Long Term goal: The participant improves quality of Life and PHQ9 Scores as seen by post scores and/or verbalization of changes             Quality of Life Scores:   Quality of Life - 04/11/22 1055       Quality of Life   Select Quality of Life      Quality of Life Scores   Health/Function Pre 26.57 %    Socioeconomic Pre 25.63 %    Psych/Spiritual Pre 29.64 %    Family Pre 28.8 %    GLOBAL Pre 27.29 %            Scores of 19 and below usually indicate a poorer quality of life in these areas.  A difference of  2-3 points is a clinically meaningful difference.  A difference of 2-3 points in the total score of the Quality of Life  Index has been associated with significant improvement in overall quality of life, self-image, physical symptoms, and general health in studies assessing change in quality of life.  PHQ-9: Review Flowsheet  More data exists      04/11/2022 09/11/2021 04/03/2021 03/16/2021 02/01/2021  Depression screen PHQ 2/9  Decreased Interest 0 0 0 0 0  Down, Depressed, Hopeless 0 0 0 0 0  PHQ - 2 Score 0 0 0 0 0  Altered sleeping 1 0 1 - -  Tired, decreased energy 1 1 1  - -  Change in appetite 1 0 0 - -  Feeling bad or failure about yourself  0 0 0 - -  Trouble concentrating 1 0 0 - -  Moving slowly or fidgety/restless 0 0 0 - -  Suicidal thoughts 0 0 0 - -  PHQ-9 Score 4 1 2  - -  Difficult doing work/chores Somewhat difficult Not difficult at all Not difficult at all - -   Interpretation of Total Score  Total Score Depression Severity:  1-4 = Minimal depression, 5-9 = Mild depression, 10-14 = Moderate depression, 15-19 = Moderately severe depression, 20-27 = Severe depression   Psychosocial Evaluation and Intervention:  Psychosocial Evaluation - 04/08/22 0939       Psychosocial Evaluation & Interventions   Interventions Encouraged to exercise with the program and follow exercise prescription;Relaxation education;Stress management education    Comments Virgie states that he has no issues with depression or anxiets and does not take anything for his mood. He can look to his wife, son, daughter and grandaughter for support.    Expected Outcomes Short: Attend HeartTrack stress management education to decrease stress. Long: Maintain exercise Post HeartTrack to keep stress at a minimum.    Continue Psychosocial Services  Follow up required by staff             Psychosocial Re-Evaluation:  Psychosocial Re-Evaluation     Row Name 05/01/22 [phone] 05/20/22  1610           Psychosocial Re-Evaluation   Current issues with None Identified None Identified      Comments Patient reports no issues with  their current mental states, sleep, stress, depression or anxiety. Will follow up with patient in a few weeks for any changes. Ismar reports no major stress concerns a this time, he relies on his wife for support as well as his son who lives close. Javeon reports that he and his wife likes to go on day trips and he enjoys staying active around the house and in his garden. He reports no issues with his sleep at this time.      Expected Outcomes Short: Continue to exercise regularly to support mental health and notify staff of any changes. Long: maintain mental health and well being through teaching of rehab or prescribed medications independently. Short: Continue to exercise regularly to support mental health and notify staff of any changes. Long: maintain mental health and well being through teaching of rehab or prescribed medications independently.      Interventions Encouraged to attend Cardiac Rehabilitation for the exercise Encouraged to attend Cardiac Rehabilitation for the exercise      Continue Psychosocial Services  Follow up required by staff Follow up required by staff               Psychosocial Discharge (Final Psychosocial Re-Evaluation):  Psychosocial Re-Evaluation - 05/20/22 0959       Psychosocial Re-Evaluation   Current issues with None Identified    Comments Koa reports no major stress concerns a this time, he relies on his wife for support as well as his son who lives close. Wagner reports that he and his wife likes to go on day trips and he enjoys staying active around the house and in his garden. He reports no issues with his sleep at this time.    Expected Outcomes Short: Continue to exercise regularly to support mental health and notify staff of any changes. Long: maintain mental health and well being through teaching of rehab or prescribed medications independently.    Interventions Encouraged to attend Cardiac Rehabilitation for the exercise    Continue Psychosocial Services   Follow up required by staff             Vocational Rehabilitation: Provide vocational rehab assistance to qualifying candidates.   Vocational Rehab Evaluation & Intervention:   Education: Education Goals: Education classes will be provided on a variety of topics geared toward better understanding of heart health and risk factor modification. Participant will state understanding/return demonstration of topics presented as noted by education test scores.  Learning Barriers/Preferences:  Learning Barriers/Preferences - 04/08/22 0935       Learning Barriers/Preferences   Learning Barriers None    Learning Preferences None             General Cardiac Education Topics:  AED/CPR: - Group verbal and written instruction with the use of models to demonstrate the basic use of the AED with the basic ABC's of resuscitation.   Anatomy and Cardiac Procedures: - Group verbal and visual presentation and models provide information about basic cardiac anatomy and function. Reviews the testing methods done to diagnose heart disease and the outcomes of the test results. Describes the treatment choices: Medical Management, Angioplasty, or Coronary Bypass Surgery for treating various heart conditions including Myocardial Infarction, Angina, Valve Disease, and Cardiac Arrhythmias.  Written material given at graduation. Flowsheet Row Cardiac Rehab from 05/01/2022 in  [facility] Cardiac and Pulmonary Rehab  Education need identified 04/11/22       Medication Safety: - Group verbal and visual instruction to review commonly prescribed medications for heart and lung disease. Reviews the medication, class of the drug, and side effects. Includes the steps to properly store meds and maintain the prescription regimen.  Written material given at graduation.   Intimacy: - Group verbal instruction through game format to discuss how heart and lung disease can affect sexual intimacy. Written material given at  graduation..   Know Your Numbers and Heart Failure: - Group verbal and visual instruction to discuss disease risk factors for cardiac and pulmonary disease and treatment options.  Reviews associated critical values for Overweight/Obesity, Hypertension, Cholesterol, and Diabetes.  Discusses basics of heart failure: signs/symptoms and treatments.  Introduces Heart Failure Zone chart for action plan for heart failure.  Written material given at graduation. Flowsheet Row Cardiac Rehab from 09/13/2021 in [facility] Cardiac and Pulmonary Rehab  Date 08/30/21  Educator SB  Instruction Review Code 1- Verbalizes Understanding       Infection Prevention: - Provides verbal and written material to individual with discussion of infection control including proper hand washing and proper equipment cleaning during exercise session. Flowsheet Row Cardiac Rehab from 05/01/2022 in [facility] Cardiac and Pulmonary Rehab  Date 04/08/22  Educator jh  Instruction Review Code 1- Verbalizes Understanding       Falls Prevention: - Provides verbal and written material to individual with discussion of falls prevention and safety. Flowsheet Row Cardiac Rehab from 05/01/2022 in [facility] Cardiac and Pulmonary Rehab  Date 04/08/22  Educator jh  Instruction Review Code 1- Verbalizes Understanding       Other: -Provides group and verbal instruction on various topics (see comments)   Knowledge Questionnaire Score:  Knowledge Questionnaire Score - 04/11/22 1054       Knowledge Questionnaire Score   Pre Score 22/26             Core Components/Risk Factors/Patient Goals at Admission:  Personal Goals and Risk Factors at Admission - 04/11/22 1110       Core Components/Risk Factors/Patient Goals on Admission    Weight Management Yes;Weight Maintenance    Admit Weight 181 lb 12.8 oz (82.5 kg)    Goal Weight: Short Term 181 lb 12.8 oz (82.5 kg)    Goal Weight: Long Term 181 lb 12.8 oz (82.5 kg)    Expected Outcomes  Short Term: Continue to assess and modify interventions until short term weight is achieved;Long Term: Adherence to nutrition and physical activity/exercise program aimed toward attainment of established weight goal;Weight Maintenance: Understanding of the daily nutrition guidelines, which includes 25-35% calories from fat, 7% or less cal from saturated fats, less than 200mg  cholesterol, less than 1.5gm of sodium, & 5 or more servings of fruits and vegetables daily;Understanding recommendations for meals to include 15-35% energy as protein, 25-35% energy from fat, 35-60% energy from carbohydrates, less than 200mg  of dietary cholesterol, 20-35 gm of total fiber daily;Understanding of distribution of calorie intake throughout the day with the consumption of 4-5 meals/snacks    Heart Failure Yes    Intervention Provide a combined exercise and nutrition program that is supplemented with education, support and counseling about heart failure. Directed toward relieving symptoms such as shortness of breath, decreased exercise tolerance, and extremity edema.    Expected Outcomes Improve functional capacity of life;Short term: Attendance in program 2-3 days a week with increased exercise capacity. Reported lower sodium intake. Reported increased  fruit and vegetable intake. Reports medication compliance.;Long term: Adoption of self-care skills and reduction of barriers for early signs and symptoms recognition and intervention leading to self-care maintenance.;Short term: Daily weights obtained and reported for increase. Utilizing diuretic protocols set by physician.    Hypertension Yes    Intervention Provide education on lifestyle modifcations including regular physical activity/exercise, weight management, moderate sodium restriction and increased consumption of fresh fruit, vegetables, and low fat dairy, alcohol moderation, and smoking cessation.;Monitor prescription use compliance.    Expected Outcomes Short Term:  Continued assessment and intervention until BP is < 140/50mm HG in hypertensive participants. < 130/61mm HG in hypertensive participants with diabetes, heart failure or chronic kidney disease.;Long Term: Maintenance of blood pressure at goal levels.    Lipids Yes    Intervention Provide education and support for participant on nutrition & aerobic/resistive exercise along with prescribed medications to achieve LDL 70mg , HDL >40mg .    Expected Outcomes Short Term: Participant states understanding of desired cholesterol values and is compliant with medications prescribed. Participant is following exercise prescription and nutrition guidelines.;Long Term: Cholesterol controlled with medications as prescribed, with individualized exercise RX and with personalized nutrition plan. Value goals: LDL < 70mg , HDL > 40 mg.             Education:Diabetes - Individual verbal and written instruction to review signs/symptoms of diabetes, desired ranges of glucose level fasting, after meals and with exercise. Acknowledge that pre and post exercise glucose checks will be done for 3 sessions at entry of program.   Core Components/Risk Factors/Patient Goals Review:   Goals and Risk Factor Review     Row Name 05/01/22 0953 05/20/22 1000           Core Components/Risk Factors/Patient Goals Review   Personal Goals Review Weight Management/Obesity;Hypertension Weight Management/Obesity;Hypertension      Review Maleek is doing well with his exercise. He is turning 89 years old this week. His blood pressure and weight is where it needs to be and he wants to maintain his weight. Ziyad reports doing well in rehab. He takes his BP at home 2-3x/week running 120s/60s. He checks his weight regularly and is staying around 180lbs now.      Expected Outcomes Short: maintain weight and blood pressure readings. Long: maintain weight and blood pressure readings independently. Short: continue checking BP and weight at home Long:  continue to manage risk factors independently               Core Components/Risk Factors/Patient Goals at Discharge (Final Review):   Goals and Risk Factor Review - 05/20/22 1000       Core Components/Risk Factors/Patient Goals Review   Personal Goals Review Weight Management/Obesity;Hypertension    Review Kalle reports doing well in rehab. He takes his BP at home 2-3x/week running 120s/60s. He checks his weight regularly and is staying around 180lbs now.    Expected Outcomes Short: continue checking BP and weight at home Long: continue to manage risk factors independently             ITP Comments:  ITP Comments     Row Name 04/08/22 0937 04/11/22 1052 04/15/22 0928 05/08/22 0743 06/05/22 0752   ITP Comments In person Visit completed. Patient informed on EP and RD appointment and 6 Minute walk test. Patient also informed of patient health questionnaires on My Chart. Patient Verbalizes understanding. Visit diagnosis can be found in [facility] 03/25/[facility]22. Completed and gym orientation. Initial ITP created and sent for review  to Dr. Bethann Punches, Medical Director. First full day of exercise!  Patient was oriented to gym and equipment including functions, settings, policies, and procedures.  Patient's individual exercise prescription and treatment plan were reviewed.  All starting workloads were established based on the results of the 6 minute walk test done at initial orientation visit.  The plan for exercise progression was also introduced and progression will be customized based on patient's performance and goals. 30 day review completed. ITP sent to Dr. Bethann Punches, Medical Director of Cardiac Rehab. Continue with ITP unless changes are made by physician. 30 Day review completed. Medical Director ITP review done, changes made as directed, and signed approval by Medical Director.            Comments:

## 2022-06-07 ENCOUNTER — Encounter: Payer: Medicare Other | Admitting: *Deleted

## 2022-06-07 DIAGNOSIS — I214 Non-ST elevation (NSTEMI) myocardial infarction: Secondary | ICD-10-CM | POA: Diagnosis not present

## 2022-06-07 DIAGNOSIS — Z955 Presence of coronary angioplasty implant and graft: Secondary | ICD-10-CM

## 2022-06-07 NOTE — Progress Notes (Signed)
Daily Session Note  Patient Details  Name: Shawn Andrews MRN: [id number] Date of Birth: 01/01/1929 Referring Provider:   Flowsheet Row Cardiac Rehab from 04/11/2022 in [facility] Cardiac and Pulmonary Rehab  Referring Provider Dr. Dorothyann Peng, MD       Encounter Date: 06/07/2022  Check In:  Session Check In - 06/07/22 0914       Check-In   Supervising physician immediately available to respond to emergencies See telemetry face sheet for immediately available ER MD    Location [facility]-Cardiac & Pulmonary Rehab    Staff Present Lanny Hurst, RN, ADN;Jessica Juanetta Gosling, MA, RCEP, CCRP, CCET;Joseph [place], [place]    Virtual Visit No    Medication changes reported     No    Fall or balance concerns reported    No    Warm-up and Cool-down Performed on first and last piece of equipment    Resistance Training Performed Yes    VAD Patient? No    PAD/SET Patient? No      Pain Assessment   Currently in Pain? No/denies                Social History   Tobacco Use  Smoking Status Former   Packs/day: 0.00   Years: 0.00   Additional pack years: 0.00   Total pack years: 0.00   Types: Pipe, Cigarettes   Quit date: 12/26/2000   Years since quitting: 21.4   Passive exposure: Past  Smokeless Tobacco Current   Types: Chew  Tobacco Comments   quit over 30 years ago    Goals Met:  Independence with exercise equipment Exercise tolerated well No report of concerns or symptoms today Strength training completed today  Goals Unmet:  Not Applicable  Comments: Pt able to follow exercise prescription today without complaint.  Will continue to monitor for progression.    Dr. Bethann Punches is Medical Director for [facility] Cardiac Rehabilitation.  Dr. Vida Rigger is Medical Director for [facility] Pulmonary Rehabilitation.

## 2022-06-10 ENCOUNTER — Encounter: Payer: Medicare Other | Admitting: *Deleted

## 2022-06-10 DIAGNOSIS — I214 Non-ST elevation (NSTEMI) myocardial infarction: Secondary | ICD-10-CM

## 2022-06-10 DIAGNOSIS — Z955 Presence of coronary angioplasty implant and graft: Secondary | ICD-10-CM

## 2022-06-10 NOTE — Progress Notes (Signed)
Daily Session Note  Patient Details  Name: Shawn Andrews MRN: [id number] Date of Birth: 1928/04/26 Referring Provider:   Flowsheet Row Cardiac Rehab from 04/11/2022 in [facility] Cardiac and Pulmonary Rehab  Referring Provider Dr. Dorothyann Peng, MD       Encounter Date: 06/10/2022  Check In:  Session Check In - 06/10/22 0928       Check-In   Supervising physician immediately available to respond to emergencies See telemetry face sheet for immediately available ER MD    Location [facility]-Cardiac & Pulmonary Rehab    Staff Present Lanny Hurst, RN, Franki Monte, BS, ACSM CEP, Exercise Physiologist;Noah Tickle, BS, Exercise Physiologist    Virtual Visit No    Medication changes reported     No    Fall or balance concerns reported    No    Warm-up and Cool-down Performed on first and last piece of equipment    Resistance Training Performed Yes    VAD Patient? No    PAD/SET Patient? No      Pain Assessment   Currently in Pain? No/denies                Social History   Tobacco Use  Smoking Status Former   Packs/day: 0.00   Years: 0.00   Additional pack years: 0.00   Total pack years: 0.00   Types: Pipe, Cigarettes   Quit date: 12/26/2000   Years since quitting: 21.4   Passive exposure: Past  Smokeless Tobacco Current   Types: Chew  Tobacco Comments   quit over 30 years ago    Goals Met:  Independence with exercise equipment Exercise tolerated well No report of concerns or symptoms today Strength training completed today  Goals Unmet:  Not Applicable  Comments: Pt able to follow exercise prescription today without complaint.  Will continue to monitor for progression.    Dr. Bethann Punches is Medical Director for [facility] Cardiac Rehabilitation.  Dr. Vida Rigger is Medical Director for [facility] Pulmonary Rehabilitation.

## 2022-06-12 ENCOUNTER — Encounter: Payer: Medicare Other | Admitting: *Deleted

## 2022-06-12 DIAGNOSIS — I214 Non-ST elevation (NSTEMI) myocardial infarction: Secondary | ICD-10-CM | POA: Diagnosis not present

## 2022-06-12 DIAGNOSIS — Z955 Presence of coronary angioplasty implant and graft: Secondary | ICD-10-CM

## 2022-06-12 NOTE — Progress Notes (Signed)
Daily Session Note  Patient Details  Name: Shawn Andrews MRN: [id number] Date of Birth: 22-Dec-1928 Referring Provider:   Flowsheet Row Cardiac Rehab from 04/11/2022 in [facility] Cardiac and Pulmonary Rehab  Referring Provider Dr. Dorothyann Peng, MD       Encounter Date: 06/12/2022  Check In:  Session Check In - 06/12/22 1001       Check-In   Supervising physician immediately available to respond to emergencies See telemetry face sheet for immediately available ER MD    Staff Present Ronette Deter, BS, Exercise Physiologist;Joseph Shelbie Proctor, RN, Eben Burow RN, BSN    Virtual Visit No    Medication changes reported     No    Fall or balance concerns reported    No    Tobacco Cessation No Change    Warm-up and Cool-down Performed on first and last piece of equipment    Resistance Training Performed Yes    VAD Patient? No    PAD/SET Patient? No      Pain Assessment   Currently in Pain? No/denies    Multiple Pain Sites No                Social History   Tobacco Use  Smoking Status Former   Packs/day: 0.00   Years: 0.00   Additional pack years: 0.00   Total pack years: 0.00   Types: Pipe, Cigarettes   Quit date: 12/26/2000   Years since quitting: 21.4   Passive exposure: Past  Smokeless Tobacco Current   Types: Chew  Tobacco Comments   quit over 30 years ago    Goals Met:  Independence with exercise equipment Exercise tolerated well No report of concerns or symptoms today Strength training completed today  Goals Unmet:  Not Applicable  Comments: Pt able to follow exercise prescription today without complaint.  Will continue to monitor for progression.    Dr. Bethann Punches is Medical Director for [facility] Cardiac Rehabilitation.  Dr. Vida Rigger is Medical Director for [facility] Pulmonary Rehabilitation.

## 2022-06-14 ENCOUNTER — Encounter: Payer: Medicare Other | Admitting: *Deleted

## 2022-06-14 DIAGNOSIS — I214 Non-ST elevation (NSTEMI) myocardial infarction: Secondary | ICD-10-CM | POA: Diagnosis not present

## 2022-06-14 DIAGNOSIS — Z955 Presence of coronary angioplasty implant and graft: Secondary | ICD-10-CM

## 2022-06-14 NOTE — Progress Notes (Signed)
Daily Session Note  Patient Details  Name: Shawn Andrews MRN: [id number] Date of Birth: 12-15-1928 Referring Provider:   Flowsheet Row Cardiac Rehab from 04/11/2022 in [facility] Cardiac and Pulmonary Rehab  Referring Provider Dr. Dorothyann Peng, MD       Encounter Date: 06/14/2022  Check In:  Session Check In - 06/14/22 1013       Check-In   Supervising physician immediately available to respond to emergencies See telemetry face sheet for immediately available ER MD    Location [facility]-Cardiac & Pulmonary Rehab    Staff Present Cora Collum, RN, BSN, CCRP;Jessica [place], MA, RCEP, CCRP, CCET;Noah Tickle, BS, Exercise Physiologist    Virtual Visit No    Medication changes reported     No    Fall or balance concerns reported    No    Warm-up and Cool-down Performed on first and last piece of equipment    Resistance Training Performed Yes    VAD Patient? No    PAD/SET Patient? No      Pain Assessment   Currently in Pain? No/denies                Social History   Tobacco Use  Smoking Status Former   Packs/day: 0.00   Years: 0.00   Additional pack years: 0.00   Total pack years: 0.00   Types: Pipe, Cigarettes   Quit date: 12/26/2000   Years since quitting: 21.4   Passive exposure: Past  Smokeless Tobacco Current   Types: Chew  Tobacco Comments   quit over 30 years ago    Goals Met:  Independence with exercise equipment Exercise tolerated well No report of concerns or symptoms today  Goals Unmet:  Not Applicable  Comments: Pt able to follow exercise prescription today without complaint.  Will continue to monitor for progression.    Dr. Bethann Punches is Medical Director for [facility] Cardiac Rehabilitation.  Dr. Vida Rigger is Medical Director for [facility] Pulmonary Rehabilitation.

## 2022-06-18 ENCOUNTER — Encounter: Payer: Medicare Other | Admitting: *Deleted

## 2022-06-18 DIAGNOSIS — I214 Non-ST elevation (NSTEMI) myocardial infarction: Secondary | ICD-10-CM

## 2022-06-18 DIAGNOSIS — Z955 Presence of coronary angioplasty implant and graft: Secondary | ICD-10-CM

## 2022-06-18 NOTE — Progress Notes (Signed)
Daily Session Note  Patient Details  Name: Shawn Andrews MRN: [id number] Date of Birth: November 29, 1928 Referring Provider:   Flowsheet Row Cardiac Rehab from 04/11/2022 in [facility] Cardiac and Pulmonary Rehab  Referring Provider Dr. Dorothyann Peng, MD       Encounter Date: 06/18/2022  Check In:  Session Check In - 06/18/22 1016       Check-In   Supervising physician immediately available to respond to emergencies See telemetry face sheet for immediately available ER MD    Location [facility]-Cardiac & Pulmonary Rehab    Staff Present Cora Collum, RN, BSN, CCRP;Laureen Manson Passey, BS, RRT, CPFT;Noah Tickle, BS, Exercise Physiologist    Virtual Visit No    Medication changes reported     No    Fall or balance concerns reported    No    Warm-up and Cool-down Performed on first and last piece of equipment    Resistance Training Performed Yes    VAD Patient? No    PAD/SET Patient? No      Pain Assessment   Currently in Pain? No/denies                Social History   Tobacco Use  Smoking Status Former   Packs/day: 0.00   Years: 0.00   Additional pack years: 0.00   Total pack years: 0.00   Types: Pipe, Cigarettes   Quit date: 12/26/2000   Years since quitting: 21.4   Passive exposure: Past  Smokeless Tobacco Current   Types: Chew  Tobacco Comments   quit over 30 years ago    Goals Met:  Independence with exercise equipment Exercise tolerated well No report of concerns or symptoms today  Goals Unmet:  Not Applicable  Comments: Pt able to follow exercise prescription today without complaint.  Will continue to monitor for progression.    Dr. Bethann Punches is Medical Director for [facility] Cardiac Rehabilitation.  Dr. Vida Rigger is Medical Director for [facility] Pulmonary Rehabilitation.

## 2022-06-19 ENCOUNTER — Encounter: Payer: Medicare Other | Admitting: *Deleted

## 2022-06-19 DIAGNOSIS — I214 Non-ST elevation (NSTEMI) myocardial infarction: Secondary | ICD-10-CM | POA: Diagnosis not present

## 2022-06-19 DIAGNOSIS — Z955 Presence of coronary angioplasty implant and graft: Secondary | ICD-10-CM

## 2022-06-19 NOTE — Progress Notes (Signed)
Daily Session Note  Patient Details  Name: Shawn Andrews MRN: [id number] Date of Birth: [birth year] Referring Provider:   Flowsheet Row Cardiac Rehab from 04/11/2022 in [facility] Cardiac and Pulmonary Rehab  Referring Provider Dr. Dorothyann Peng, MD       Encounter Date: 06/19/2022  Check In:  Session Check In - 06/19/22 1003       Check-In   Supervising physician immediately available to respond to emergencies See telemetry face sheet for immediately available ER MD    Location [facility]-Cardiac & Pulmonary Rehab    Staff Present Lanny Hurst, RN, ADN;Joseph Reino [place], RCP,RRT,BSRT;Noah Tickle, BS, Exercise Physiologist    Virtual Visit No    Medication changes reported     No    Fall or balance concerns reported    No    Warm-up and Cool-down Performed on first and last piece of equipment    Resistance Training Performed Yes    VAD Patient? No    PAD/SET Patient? No      Pain Assessment   Currently in Pain? No/denies                Social History   Tobacco Use  Smoking Status Former   Packs/day: 0.00   Years: 0.00   Additional pack years: 0.00   Total pack years: 0.00   Types: Pipe, Cigarettes   Quit date: 12/26/2000   Years since quitting: 21.4   Passive exposure: Past  Smokeless Tobacco Current   Types: Chew  Tobacco Comments   quit over 30 years ago    Goals Met:  Independence with exercise equipment Exercise tolerated well No report of concerns or symptoms today Strength training completed today  Goals Unmet:  Not Applicable  Comments: Pt able to follow exercise prescription today without complaint.  Will continue to monitor for progression.    Dr. Bethann Punches is Medical Director for [facility] Cardiac Rehabilitation.  Dr. Vida Rigger is Medical Director for [facility] Pulmonary Rehabilitation.

## 2022-06-24 ENCOUNTER — Encounter: Payer: Medicare Other | Attending: Internal Medicine | Admitting: *Deleted

## 2022-06-24 DIAGNOSIS — Z955 Presence of coronary angioplasty implant and graft: Secondary | ICD-10-CM | POA: Insufficient documentation

## 2022-06-24 DIAGNOSIS — I214 Non-ST elevation (NSTEMI) myocardial infarction: Secondary | ICD-10-CM | POA: Insufficient documentation

## 2022-06-28 ENCOUNTER — Encounter: Payer: Medicare Other | Admitting: *Deleted

## 2022-06-28 DIAGNOSIS — I214 Non-ST elevation (NSTEMI) myocardial infarction: Secondary | ICD-10-CM | POA: Diagnosis not present

## 2022-06-28 DIAGNOSIS — Z955 Presence of coronary angioplasty implant and graft: Secondary | ICD-10-CM | POA: Diagnosis present

## 2022-06-28 NOTE — Progress Notes (Signed)
Daily Session Note  Patient Details  Name: Shawn Andrews MRN: [id number] Date of Birth: Mar 06, 1928 Referring Provider:   Flowsheet Row Cardiac Rehab from 04/11/2022 in [facility] Cardiac and Pulmonary Rehab  Referring Provider Dr. Dorothyann Peng, MD       Encounter Date: 06/28/2022  Check In:  Session Check In - 06/28/22 1010       Check-In   Supervising physician immediately available to respond to emergencies See telemetry face sheet for immediately available ER MD    Location [facility]-Cardiac & Pulmonary Rehab    Staff Present Cora Collum, RN, BSN, CCRP;Jessica [place], MA, RCEP, CCRP, CCET;Joseph [place], [place]    Virtual Visit No    Medication changes reported     No    Fall or balance concerns reported    No    Warm-up and Cool-down Performed on first and last piece of equipment    Resistance Training Performed Yes    VAD Patient? No    PAD/SET Patient? No      Pain Assessment   Currently in Pain? No/denies                Social History   Tobacco Use  Smoking Status Former   Packs/day: 0.00   Years: 0.00   Additional pack years: 0.00   Total pack years: 0.00   Types: Pipe, Cigarettes   Quit date: 12/26/2000   Years since quitting: 21.5   Passive exposure: Past  Smokeless Tobacco Current   Types: Chew  Tobacco Comments   quit over 30 years ago    Goals Met:  Independence with exercise equipment Exercise tolerated well No report of concerns or symptoms today  Goals Unmet:  Not Applicable  Comments: Pt able to follow exercise prescription today without complaint.  Will continue to monitor for progression.    Dr. Bethann Punches is Medical Director for [facility] Cardiac Rehabilitation.  Dr. Vida Rigger is Medical Director for [facility] Pulmonary Rehabilitation.

## 2022-07-01 ENCOUNTER — Encounter: Payer: Medicare Other | Admitting: *Deleted

## 2022-07-01 DIAGNOSIS — I214 Non-ST elevation (NSTEMI) myocardial infarction: Secondary | ICD-10-CM

## 2022-07-01 DIAGNOSIS — Z955 Presence of coronary angioplasty implant and graft: Secondary | ICD-10-CM

## 2022-07-01 NOTE — Progress Notes (Signed)
Daily Session Note  Patient Details  Name: Shawn Andrews MRN: [id number] Date of Birth: [birth year] Referring Provider:   Flowsheet Row Cardiac Rehab from 04/11/2022 in [facility] Cardiac and Pulmonary Rehab  Referring Provider Dr. Dorothyann Peng, MD       Encounter Date: 07/01/2022  Check In:  Session Check In - 07/01/22 1002       Check-In   Supervising physician immediately available to respond to emergencies See telemetry face sheet for immediately available ER MD    Location [facility]-Cardiac & Pulmonary Rehab    Staff Present Cyndia Diver, RN, BSN, Ronelle Nigh, BS, Exercise Physiologist;Other    Virtual Visit No    Medication changes reported     No    Fall or balance concerns reported    No    Tobacco Cessation No Change    Warm-up and Cool-down Performed on first and last piece of equipment    Resistance Training Performed Yes    VAD Patient? No    PAD/SET Patient? No                Social History   Tobacco Use  Smoking Status Former   Packs/day: 0.00   Years: 0.00   Additional pack years: 0.00   Total pack years: 0.00   Types: Pipe, Cigarettes   Quit date: 12/26/2000   Years since quitting: 21.5   Passive exposure: Past  Smokeless Tobacco Current   Types: Chew  Tobacco Comments   quit over 30 years ago    Goals Met:  Independence with exercise equipment Personal goals reviewed Strength training completed today  Goals Unmet:  Not Applicable  Comments: Pt able to follow exercise prescription today without complaint.  Will continue to monitor for progression.    Dr. Bethann Punches is Medical Director for [facility] Cardiac Rehabilitation.  Dr. Vida Rigger is Medical Director for [facility] Pulmonary Rehabilitation.

## 2022-07-03 ENCOUNTER — Encounter: Payer: Medicare Other | Admitting: *Deleted

## 2022-07-03 ENCOUNTER — Encounter: Payer: Self-pay | Admitting: *Deleted

## 2022-07-03 DIAGNOSIS — I214 Non-ST elevation (NSTEMI) myocardial infarction: Secondary | ICD-10-CM

## 2022-07-03 DIAGNOSIS — Z955 Presence of coronary angioplasty implant and graft: Secondary | ICD-10-CM

## 2022-07-03 NOTE — Progress Notes (Signed)
Suprapubic Cath Change  Patient is present today for a suprapubic catheter change due to urinary retention.  9 ml of water was drained from the balloon, a 16 FR foley cath was removed from the tract with out difficulty.  Site was cleaned and prepped in a sterile fashion with betadine.  A 16 FR foley cath was replaced into the tract no complications were noted. Urine return was noted, 10 ml of sterile water was inflated into the balloon and a leg bag was attached for drainage.  Patient tolerated well. A night bag was given to patient and proper instruction was given on how to switch bags.    Performed by: Michiel Cowboy, PA-C   Follow up: Return in about 6 weeks (around 08/15/2022) for SPT exchange .

## 2022-07-03 NOTE — Progress Notes (Signed)
Daily Session Note  Patient Details  Name: Shawn Andrews MRN: [id number] Date of Birth: Jun 18, 1928 Referring Provider:   Flowsheet Row Cardiac Rehab from 04/11/2022 in [facility] Cardiac and Pulmonary Rehab  Referring Provider Dr. Dorothyann Peng, MD       Encounter Date: 07/03/2022  Check In:  Session Check In - 07/03/22 0948       Check-In   Supervising physician immediately available to respond to emergencies See telemetry face sheet for immediately available ER MD    Location [facility]-Cardiac & Pulmonary Rehab    Staff Present Susann Givens, RN BSN;Joseph [place], RCP,RRT,BSRT;Jessica [place], [place], RCEP, CCRP, [place], [place], Exercise Physiologist    Virtual Visit No    Medication changes reported     No    Fall or balance concerns reported    No    Warm-up and Cool-down Performed on first and last piece of equipment    Resistance Training Performed Yes    VAD Patient? No    PAD/SET Patient? No      Pain Assessment   Currently in Pain? No/denies                Social History   Tobacco Use  Smoking Status Former   Packs/day: 0.00   Years: 0.00   Additional pack years: 0.00   Total pack years: 0.00   Types: Pipe, Cigarettes   Quit date: 12/26/2000   Years since quitting: 21.5   Passive exposure: Past  Smokeless Tobacco Current   Types: Chew  Tobacco Comments   quit over 30 years ago    Goals Met:  Independence with exercise equipment Exercise tolerated well No report of concerns or symptoms today Strength training completed today  Goals Unmet:  Not Applicable  Comments: Pt able to follow exercise prescription today without complaint.  Will continue to monitor for progression.    Dr. Bethann Punches is Medical Director for [facility] Cardiac Rehabilitation.  Dr. Vida Rigger is Medical Director for [facility] Pulmonary Rehabilitation.

## 2022-07-03 NOTE — Progress Notes (Signed)
Cardiac Individual Treatment Plan  Patient Details  Name: Shawn Andrews MRN: [id number] Date of Birth: 1928-10-14 Referring Provider:   Flowsheet Row Cardiac Rehab from 04/11/2022 in [facility] Cardiac and Pulmonary Rehab  Referring Provider Dr. Lujean Amel, MD       Initial Encounter Date:  Flowsheet Row Cardiac Rehab from 04/11/2022 in [facility] Cardiac and Pulmonary Rehab  Date 04/11/22       Visit Diagnosis: NSTEMI (non-ST elevated myocardial infarction) [facility])  Status post coronary artery stent placement  Patient's Home Medications on Admission:  Current Outpatient Medications:    acetaminophen (TYLENOL 8 HOUR) 650 MG CR tablet, Take 1 tablet (650 mg total) by mouth 2 (two) times a day., Disp: , Rfl:    aspirin 81 MG chewable tablet, Chew 1 tablet by mouth daily., Disp: , Rfl:    Cholecalciferol (VITAMIN D3) 25 MCG (1000 UT) CAPS, Take 1 capsule (1,000 Units total) by mouth daily., Disp: 30 capsule, Rfl:    clopidogrel (PLAVIX) 75 MG tablet, Take 75 mg by mouth daily., Disp: , Rfl:    cyanocobalamin 1000 MCG tablet, Take 1,000 mcg by mouth daily., Disp: , Rfl:    docusate sodium (COLACE) 100 MG capsule, Take 1 capsule (100 mg total) by mouth daily., Disp: , Rfl:    famotidine (PEPCID) 20 MG tablet, TAKE 1 TABLET BY MOUTH AT  BEDTIME (Patient taking differently: Take 20 mg by mouth at bedtime.), Disp: 90 tablet, Rfl: 3   finasteride (PROSCAR) 5 MG tablet, TAKE 1 TABLET BY MOUTH  DAILY (Patient taking differently: Take 5 mg by mouth daily.), Disp: 90 tablet, Rfl: 3   furosemide (LASIX) 20 MG tablet, Take 1 tablet by mouth every other day., Disp: , Rfl:    hydrALAZINE (APRESOLINE) 50 MG tablet, Take 1 tablet (50 mg total) by mouth every 8 (eight) hours., Disp: 90 tablet, Rfl: 0   isosorbide mononitrate (IMDUR) 60 MG 24 hr tablet, Take 1 tablet (60 mg total) by mouth 2 (two) times daily., Disp: 60 tablet, Rfl: 1   loratadine (CLARITIN) 10 MG tablet, Take 1 tablet (10 mg total) by mouth  daily as needed for allergies., Disp: 30 tablet, Rfl:    metoprolol tartrate (LOPRESSOR) 50 MG tablet, Take 0.5 tablets (25 mg total) by mouth 2 (two) times daily., Disp: , Rfl:    nitroGLYCERIN (NITROSTAT) 0.4 MG SL tablet, Place 1 tablet (0.4 mg total) under the tongue every 5 (five) minutes x 3 doses as needed for chest pain., Disp: 30 tablet, Rfl: 12   pantoprazole (PROTONIX) 40 MG tablet, Take 40 mg by mouth 2 (two) times daily., Disp: , Rfl:    polyethylene glycol powder (GLYCOLAX/MIRALAX) 17 GM/SCOOP powder, Take 8.5-17 g by mouth daily as needed for moderate constipation. (Patient not taking: Reported on 04/08/2022), Disp: 3350 g, Rfl: 0   primidone (MYSOLINE) 50 MG tablet, Take 2 tablets (100 mg total) by mouth 3 (three) times daily., Disp: , Rfl:    rosuvastatin (CRESTOR) 40 MG tablet, Take 1 tablet (40 mg total) by mouth at bedtime., Disp: 30 tablet, Rfl: 2  Past Medical History: Past Medical History:  Diagnosis Date   Abnormal drug screen 06/22/2014   inapprop neg xanax rpt 3 mo (06/2014)   BPH (benign prostatic hypertrophy) 01/21/1998   has had 3 biopsies in past ([facility]) decided to stop PSA/DRE   CHF (congestive heart failure) (HCC)    Coronary artery disease    Hyperlipidemia 01/21/2002   Hypertension 05/22/2003   Left lumbar radiculopathy  Osteoarthritis 01/22/1988   knees, lumbar spondylosis and listhesis    Tobacco Use: Social History   Tobacco Use  Smoking Status Former   Packs/day: 0.00   Years: 0.00   Additional pack years: 0.00   Total pack years: 0.00   Types: Pipe, Cigarettes   Quit date: 12/26/2000   Years since quitting: 21.5   Passive exposure: Past  Smokeless Tobacco Current   Types: Chew  Tobacco Comments   quit over 30 years ago    Labs: Review Flowsheet  More data exists      Latest Ref Rng & Units 08/18/2018 08/25/2019 08/25/2020 01/17/2021 01/18/2021  Labs for ITP Cardiac and Pulmonary Rehab  Cholestrol 0 - 200 mg/dL 086  578  469  - 629    LDL (calc) 0 - 99 mg/dL 528  413  244  - 99   HDL-C >40 mg/dL 01.02  72.53  66.44  - 45   Trlycerides <150 mg/dL 03.4  742.5  956.3  - 66   Hemoglobin A1c 4.8 - 5.6 % - - - 5.0  -     Exercise Target Goals: Exercise Program Goal: Individual exercise prescription set using results from initial 6 min walk test and THRR while considering  patient's activity barriers and safety.   Exercise Prescription Goal: Initial exercise prescription builds to 30-45 minutes a day of aerobic activity, 2-3 days per week.  Home exercise guidelines will be given to patient during program as part of exercise prescription that the participant will acknowledge.   Education: Aerobic Exercise: - Group verbal and visual presentation on the components of exercise prescription. Introduces F.I.T.T principle from ACSM for exercise prescriptions.  Reviews F.I.T.T. principles of aerobic exercise including progression. Written material given at graduation. Flowsheet Row Cardiac Rehab from 05/01/2022 in [facility] Cardiac and Pulmonary Rehab  Education need identified 04/11/22       Education: Resistance Exercise: - Group verbal and visual presentation on the components of exercise prescription. Introduces F.I.T.T principle from ACSM for exercise prescriptions  Reviews F.I.T.T. principles of resistance exercise including progression. Written material given at graduation. Flowsheet Row Cardiac Rehab from 09/13/2021 in [facility] Cardiac and Pulmonary Rehab  Date 08/09/21  Educator [facility]  Instruction Review Code 1- Verbalizes Understanding        Education: Exercise & Equipment Safety: - Individual verbal instruction and demonstration of equipment use and safety with use of the equipment. Flowsheet Row Cardiac Rehab from 05/01/2022 in [facility] Cardiac and Pulmonary Rehab  Education need identified 04/08/22  Date 04/08/22  Educator [facility]  Instruction Review Code 1- Verbalizes Understanding       Education: Exercise Physiology &  General Exercise Guidelines: - Group verbal and written instruction with models to review the exercise physiology of the cardiovascular system and associated critical values. Provides general exercise guidelines with specific guidelines to those with heart or lung disease.  Flowsheet Row Cardiac Rehab from 09/13/2021 in [facility] Cardiac and Pulmonary Rehab  Education need identified 04/03/21       Education: Flexibility, Balance, Mind/Body Relaxation: - Group verbal and visual presentation with interactive activity on the components of exercise prescription. Introduces F.I.T.T principle from ACSM for exercise prescriptions. Reviews F.I.T.T. principles of flexibility and balance exercise training including progression. Also discusses the mind body connection.  Reviews various relaxation techniques to help reduce and manage stress (i.e. Deep breathing, progressive muscle relaxation, and visualization). Balance handout provided to take home. Written material given at graduation. Flowsheet Row Cardiac Rehab from 09/13/2021 in [facility] Cardiac  and Pulmonary Rehab  Date 08/09/21  Educator [facility]  Instruction Review Code 1- Verbalizes Understanding       Activity Barriers & Risk Stratification:  Activity Barriers & Cardiac Risk Stratification - 04/11/22 1332       Activity Barriers & Cardiac Risk Stratification   Activity Barriers Decreased Ventricular Function;Joint Problems    Comments Suprapubic catheter- reports no limitation or problems with movement or activity, R Shoulder pain, L hip pain    Cardiac Risk Stratification High             6 Minute Walk:  6 Minute Walk     Row Name 04/11/22 1327         6 Minute Walk   Phase Initial     Distance 910 feet     Walk Time 6 minutes     # of Rest Breaks 0     MPH 1.72     METS 1.2     RPE 11     Perceived Dyspnea  1     VO2 Peak 4.19     Symptoms Yes (comment)     Comments hip pain     Resting HR 57 bpm     Resting BP 138/60      Resting Oxygen Saturation  97 %     Exercise Oxygen Saturation  during 6 min walk 96 %     Max Ex. HR 82 bpm     Max Ex. BP 152/54     2 Minute Post BP 142/52              Oxygen Initial Assessment:   Oxygen Re-Evaluation:   Oxygen Discharge (Final Oxygen Re-Evaluation):   Initial Exercise Prescription:  Initial Exercise Prescription - 04/11/22 1300       Date of Initial Exercise RX and Referring Provider   Date 04/11/22    Referring Provider Dr. Dorothyann Peng, MD      Oxygen   Maintain Oxygen Saturation 88% or higher      Recumbant Bike   Level 2    Watts 15    Minutes 15    METs 1.2      NuStep   Level 2    Minutes 15    METs 1.2      Track   Laps 20    Minutes 15    METs 2.09      Prescription Details   Frequency (times per week) 2    Duration Progress to 30 minutes of continuous aerobic without signs/symptoms of physical distress      Intensity   THRR 40-80% of Max Heartrate 85-115    Ratings of Perceived Exertion 11-13    Perceived Dyspnea 0-4      Progression   Progression Continue to progress workloads to maintain intensity without signs/symptoms of physical distress.      Resistance Training   Training Prescription Yes    Weight 4 lb    Reps 10-15             Perform Capillary Blood Glucose checks as needed.  Exercise Prescription Changes:   Exercise Prescription Changes     Row Name 04/11/22 1300 04/22/22 1400 05/01/22 1000 05/06/22 1100 05/20/22 1400     Response to Exercise   Blood Pressure (Admit) 138/60 108/54 -- 112/62 120/55   Blood Pressure (Exercise) 152/54 130/68 -- -- 154/60   Blood Pressure (Exit) 142/52 116/68 -- 100/60 108/54   Heart Rate (Admit) 57 bpm  57 bpm -- 60 bpm 57 bpm   Heart Rate (Exercise) 82 bpm 83 bpm -- 97 bpm 94 bpm   Heart Rate (Exit) 60 bpm 65 bpm -- 61 bpm 66 bpm   Oxygen Saturation (Admit) 97 % -- -- -- --   Oxygen Saturation (Exercise) 96 % -- -- -- --   Rating of Perceived Exertion  (Exercise) 11 13 -- 13 13   Perceived Dyspnea (Exercise) 1 -- -- -- --   Symptoms hip pain none -- none none   Comments Results First two days of exercise -- -- --   Duration -- Continue with 30 min of aerobic exercise without signs/symptoms of physical distress. -- Continue with 30 min of aerobic exercise without signs/symptoms of physical distress. Continue with 30 min of aerobic exercise without signs/symptoms of physical distress.   Intensity -- THRR unchanged -- THRR unchanged THRR unchanged     Progression   Progression -- Continue to progress workloads to maintain intensity without signs/symptoms of physical distress. -- Continue to progress workloads to maintain intensity without signs/symptoms of physical distress. Continue to progress workloads to maintain intensity without signs/symptoms of physical distress.   Average METs -- 2.8 -- 2.58 2.31     Resistance Training   Training Prescription -- Yes -- Yes Yes   Weight -- 4 lb -- 4 lb 4 lb   Reps -- 10-15 -- 10-15 10-15     Interval Training   Interval Training -- No -- No No     Recumbant Bike   Level -- 2 -- 3 --   Watts -- 15 -- 15 --   Minutes -- 15 -- 15 --   METs -- 3.1 -- 2.57 --     NuStep   Level -- 5 -- 5 5   Minutes -- 15 -- 15 15   METs -- 3.3 -- 3 3.4     T5 Nustep   Level -- -- -- 4 3   Minutes -- -- -- 15 15   METs -- -- -- 1.9 1.9     Biostep-RELP   Level -- -- -- 4 --   Minutes -- -- -- 15 --   METs -- -- -- 3 --     Track   Laps -- 13 -- 25 22   Minutes -- 10 -- 15 15   METs -- 1.71 -- 2.36 2.2     Home Exercise Plan   Plans to continue exercise at -- -- Home (comment)  walking Home (comment)  walking Home (comment)  walking   Frequency -- -- Add 3 additional days to program exercise sessions. Add 3 additional days to program exercise sessions. Add 3 additional days to program exercise sessions.   Initial Home Exercises Provided -- -- 05/01/22 05/01/22 05/01/22     Oxygen   Maintain  Oxygen Saturation -- 88% or higher -- 88% or higher 88% or higher    Row Name 06/03/22 1000 06/18/22 0700 07/01/22 1500         Response to Exercise   Blood Pressure (Admit) 104/60 128/66 120/66     Blood Pressure (Exit) 96/58 112/56 108/52     Heart Rate (Admit) 57 bpm 53 bpm 55 bpm     Heart Rate (Exercise) 91 bpm 95 bpm 80 bpm     Heart Rate (Exit) 56 bpm 58 bpm 63 bpm     Oxygen Saturation (Admit) -- -- 92 %     Oxygen Saturation (Exercise) -- --  97 %     Oxygen Saturation (Exit) -- -- 95 %     Rating of Perceived Exertion (Exercise) 15 13 13      Symptoms none none none     Duration Continue with 30 min of aerobic exercise without signs/symptoms of physical distress. Continue with 30 min of aerobic exercise without signs/symptoms of physical distress. Continue with 30 min of aerobic exercise without signs/symptoms of physical distress.     Intensity THRR unchanged THRR unchanged THRR unchanged       Progression   Progression Continue to progress workloads to maintain intensity without signs/symptoms of physical distress. Continue to progress workloads to maintain intensity without signs/symptoms of physical distress. Continue to progress workloads to maintain intensity without signs/symptoms of physical distress.     Average METs 2.29 1.92 2.52       Resistance Training   Training Prescription Yes Yes Yes     Weight 4 lb 4 lb 4 lb     Reps 10-15 10-15 10-15       Interval Training   Interval Training No No No       Recumbant Bike   Level 4 -- 3     Watts -- -- 20     Minutes 15 -- 15     METs 2.57 -- 2.74       NuStep   Level 4 4 5      Minutes 15 15 15      METs 2.5 2.4 2.5       REL-XR   Level 1 -- --     Minutes 15 -- --       T5 Nustep   Level -- 4 --     Minutes -- 15 --     METs -- 1.9 --       Biostep-RELP   Level 4 4 --     Minutes 15 15 --     METs 3 2 --       Track   Laps 23 20 24      Minutes 15 15 15      METs 2.25 2.09 2.31       Home  Exercise Plan   Plans to continue exercise at Home (comment)  walking Home (comment)  walking Home (comment)  walking     Frequency Add 3 additional days to program exercise sessions. Add 3 additional days to program exercise sessions. Add 3 additional days to program exercise sessions.     Initial Home Exercises Provided 05/01/22 05/01/22 05/01/22       Oxygen   Maintain Oxygen Saturation 88% or higher 88% or higher 88% or higher              Exercise Comments:   Exercise Comments     Row Name 04/15/22 1610           Exercise Comments First full day of exercise!  Patient was oriented to gym and equipment including functions, settings, policies, and procedures.  Patient's individual exercise prescription and treatment plan were reviewed.  All starting workloads were established based on the results of the 6 minute walk test done at initial orientation visit.  The plan for exercise progression was also introduced and progression will be customized based on patient's performance and goals.                Exercise Goals and Review:   Exercise Goals     Row Name 04/11/22 [phone]  Exercise Goals   Increase Physical Activity Yes       Intervention Provide advice, education, support and counseling about physical activity/exercise needs.;Develop an individualized exercise prescription for aerobic and resistive training based on initial evaluation findings, risk stratification, comorbidities and participant's personal goals.       Expected Outcomes Short Term: Attend rehab on a regular basis to increase amount of physical activity.;Long Term: Add in home exercise to make exercise part of routine and to increase amount of physical activity.;Long Term: Exercising regularly at least 3-5 days a week.       Increase Strength and Stamina Yes       Intervention Provide advice, education, support and counseling about physical activity/exercise needs.;Develop an individualized  exercise prescription for aerobic and resistive training based on initial evaluation findings, risk stratification, comorbidities and participant's personal goals.       Expected Outcomes Short Term: Increase workloads from initial exercise prescription for resistance, speed, and METs.;Short Term: Perform resistance training exercises routinely during rehab and add in resistance training at home;Long Term: Improve cardiorespiratory fitness, muscular endurance and strength as measured by increased METs and functional capacity ( )       Able to understand and use rate of perceived exertion (RPE) scale Yes       Intervention Provide education and explanation on how to use RPE scale       Expected Outcomes Short Term: Able to use RPE daily in rehab to express subjective intensity level;Long Term:  Able to use RPE to guide intensity level when exercising independently       Able to understand and use Dyspnea scale Yes       Intervention Provide education and explanation on how to use Dyspnea scale       Expected Outcomes Short Term: Able to use Dyspnea scale daily in rehab to express subjective sense of shortness of breath during exertion;Long Term: Able to use Dyspnea scale to guide intensity level when exercising independently       Knowledge and understanding of Target Heart Rate Range (THRR) Yes       Intervention Provide education and explanation of THRR including how the numbers were predicted and where they are located for reference       Expected Outcomes Short Term: Able to use daily as guideline for intensity in rehab;Short Term: Able to state/look up THRR;Long Term: Able to use THRR to govern intensity when exercising independently       Able to check pulse independently Yes       Intervention Provide education and demonstration on how to check pulse in carotid and radial arteries.;Review the importance of being able to check your own pulse for safety during independent exercise       Expected  Outcomes Short Term: Able to explain why pulse checking is important during independent exercise;Long Term: Able to check pulse independently and accurately       Understanding of Exercise Prescription Yes       Intervention Provide education, explanation, and written materials on patient's individual exercise prescription       Expected Outcomes Short Term: Able to explain program exercise prescription;Long Term: Able to explain home exercise prescription to exercise independently                Exercise Goals Re-Evaluation :  Exercise Goals Re-Evaluation     Row Name 04/15/22 0929 04/22/22 1432 05/01/22 1011 05/06/22 1156 05/20/22 1002     Exercise Goal Re-Evaluation  Exercise Goals Review Able to understand and use rate of perceived exertion (RPE) scale;Able to understand and use Dyspnea scale;Knowledge and understanding of Target Heart Rate Range (THRR);Understanding of Exercise Prescription Increase Physical Activity;Increase Strength and Stamina;Understanding of Exercise Prescription Increase Physical Activity;Increase Strength and Stamina;Understanding of Exercise Prescription;Able to understand and use rate of perceived exertion (RPE) scale;Able to understand and use Dyspnea scale;Knowledge and understanding of Target Heart Rate Range (THRR);Able to check pulse independently Increase Physical Activity;Increase Strength and Stamina;Understanding of Exercise Prescription Increase Physical Activity;Increase Strength and Stamina;Understanding of Exercise Prescription   Comments Reviewed RPE scale, THR and program prescription with pt today.  Pt voiced understanding and was given a copy of goals to take home. Keontaye is off to a good start in the program. During his first two sessions of rehab he was able to work at level 5 on the T4 nustep and level 2 on the recumbent bike. He also was able to walk 13 laps on the track in 10 minutes. We will continue to monitor his progress in the program.  Reviewed home exercise with pt today.  Pt plans to walk at home for exercise.  Reviewed THR, pulse, RPE, sign and symptoms, pulse oximetery and when to call 911 or MD.  Also discussed weather considerations and indoor options.  Pt voiced understanding. Dailyn continues to do well in rehab. He increased to 25 laps on the track which is his highest yet. He also incresed to level 3 on the recumbent bike, we hope to see his watts improve over time. He is doing well at level 5 on the T4 Nustep and  reaches his THR some sessions. Will continue to monitor. Mona reports walking at home for exercise and stays active around the house as well as in his garden.   Expected Outcomes Short: Use RPE daily to regulate intensity. Long: Follow program prescription in THR. Short: Continue to follow current exercise prescription. Long: Continue to improve strength and stamina. Short: Start to walk again at home Long: Continue to exercise independently Short: Increase watts on recumbent bike Long: Continue to increase overall MET level and stamina Short: continue to walk at home Long: Continue to exercise independently    Row Name 05/20/22 1447 06/03/22 1019 06/18/22 0742 06/18/22 0948 07/01/22 1525     Exercise Goal Re-Evaluation   Exercise Goals Review Increase Physical Activity;Increase Strength and Stamina;Understanding of Exercise Prescription Increase Physical Activity;Increase Strength and Stamina;Understanding of Exercise Prescription Increase Physical Activity;Increase Strength and Stamina;Understanding of Exercise Prescription Increase Physical Activity;Increase Strength and Stamina;Understanding of Exercise Prescription Increase Physical Activity;Increase Strength and Stamina;Understanding of Exercise Prescription   Comments Raudel is doing well in rehab. He has continued to consistently walk above 20 laps on the track. He also has continued to work at level 3 on the T5 nustep and level 5 on the T4 nustep. He has done well  with 4 lb hand weights for resistance training as well. We will continue to monitor his progress. Jaheed continues to do well in rehab. He ranges walking between 20-25 laps on the track, our goal is for him to reach up to 30 overall. He did increase to level 4 on the recumbent bike and tried out the XR for the first time at level 1. He could benefit from increasing his level on the XR as he appears to be at level 4 on other seated machines, such as both the T4 and Biostep . He does reach his THR most sessions. We will continue  to monitor. Colter is doing well in rehab. He continues to walk up to 20 laps on the track. He also continues to work at level 4 on the biostep, T5 nustep, and T4 nustep. He has continued to use 4 lb hand weights for resistance training as well. We will continue to monitor his progress in the program. Lindal reports doing well with exercise. He states that he has been increasing his workloads in the program. He also states that he has improved his strength and stamina. He also reports that his left hip pain has limited him with exercise. He states that he has been staying active and does not do a lot of sitting around. He also walks at home three days a week. We will continue to monitor his progress. Evrett continues to do well in rehab.  He is up to 24 laps this week, usually around 13-15 laps.  He is also on level 3 on the bike.  We will continue to monitor his progress.   Expected Outcomes Short: Continue to push for more laps on the track. Long: Continue to improve strength and stamina. Short: Increase level on XR to 2, strive to hit 30 laps on track Long: Continue to increase overall MET level and stamina Short: Continue to push for more laps on the track. Long: Continue to improve strength and stamina. Short: Continue to walk at home on days away from rehab. Long: Continue to improve strength and stamina. Short: Continue to walk more Long: continue to improve stamina             Discharge Exercise Prescription (Final Exercise Prescription Changes):  Exercise Prescription Changes - 07/01/22 1500       Response to Exercise   Blood Pressure (Admit) 120/66    Blood Pressure (Exit) 108/52    Heart Rate (Admit) 55 bpm    Heart Rate (Exercise) 80 bpm    Heart Rate (Exit) 63 bpm    Oxygen Saturation (Admit) 92 %    Oxygen Saturation (Exercise) 97 %    Oxygen Saturation (Exit) 95 %    Rating of Perceived Exertion (Exercise) 13    Symptoms none    Duration Continue with 30 min of aerobic exercise without signs/symptoms of physical distress.    Intensity THRR unchanged      Progression   Progression Continue to progress workloads to maintain intensity without signs/symptoms of physical distress.    Average METs 2.52      Resistance Training   Training Prescription Yes    Weight 4 lb    Reps 10-15      Interval Training   Interval Training No      Recumbant Bike   Level 3    Watts 20    Minutes 15    METs 2.74      NuStep   Level 5    Minutes 15    METs 2.5      Track   Laps 24    Minutes 15    METs 2.31      Home Exercise Plan   Plans to continue exercise at Home (comment)   walking   Frequency Add 3 additional days to program exercise sessions.    Initial Home Exercises Provided 05/01/22      Oxygen   Maintain Oxygen Saturation 88% or higher             Nutrition:  Target Goals: Understanding of nutrition guidelines, daily intake of sodium 1500mg , cholesterol <  200mg , calories 30% from fat and 7% or less from saturated fats, daily to have 5 or more servings of fruits and vegetables.  Education: All About Nutrition: -Group instruction provided by verbal, written material, interactive activities, discussions, models, and posters to present general guidelines for heart healthy nutrition including fat, fiber, MyPlate, the role of sodium in heart healthy nutrition, utilization of the nutrition label, and utilization of this knowledge  for meal planning. Follow up email sent as well. Written material given at graduation. Flowsheet Row Cardiac Rehab from 05/01/2022 in [facility] Cardiac and Pulmonary Rehab  Education need identified 04/11/22       Biometrics:  Pre Biometrics - 04/11/22 1335       Pre Biometrics   Height 5' 10.5" (1.791 m)    Weight 181 lb 12.8 oz (82.5 kg)    Waist Circumference 38 inches    Hip Circumference 40 inches    Waist to Hip Ratio 0.95 %    BMI (Calculated) 25.71    Single Leg Stand 3.4 seconds              Nutrition Therapy Plan and Nutrition Goals:  Nutrition Therapy & Goals - 04/11/22 1108       Intervention Plan   Intervention Prescribe, educate and counsel regarding individualized specific dietary modifications aiming towards targeted core components such as weight, hypertension, lipid management, diabetes, heart failure and other comorbidities.    Expected Outcomes Short Term Goal: Understand basic principles of dietary content, such as calories, fat, sodium, cholesterol and nutrients.;Short Term Goal: A plan has been developed with personal nutrition goals set during dietitian appointment.;Long Term Goal: Adherence to prescribed nutrition plan.             Nutrition Assessments:  MEDIFICTS Score Key: ?70 Need to make dietary changes  40-70 Heart Healthy Diet ? 40 Therapeutic Level Cholesterol Diet  Flowsheet Row Cardiac Rehab from 04/11/2022 in [facility] Cardiac and Pulmonary Rehab  Picture Your Plate Total Score on Admission 86      Picture Your Plate Scores: <16 Unhealthy dietary pattern with much room for improvement. 41-50 Dietary pattern unlikely to meet recommendations for good health and room for improvement. 51-60 More healthful dietary pattern, with some room for improvement.  >60 Healthy dietary pattern, although there may be some specific behaviors that could be improved.    Nutrition Goals Re-Evaluation:  Nutrition Goals Re-Evaluation     Row Name  05/01/22 0950 05/20/22 0955 06/18/22 1004         Goals   Current Weight 182 lb (82.6 kg) -- --     Nutrition Goal Continue to eat heart healthy Short: continue with heart healthy eating patterns Long: maintain heart healthy diet independently --     Comment Nichael does not want to meet with RD. He has a good hold on his diet, eats lots of fruits and does not over eat. Jahaziel reports doing well with his nutrition. He reports that his wife takes care of him and has him on a low salt diet and continues to choose heart healthy foods. He would not like to meet with the dietitian at this time and reports no questions or concerns at this time. Ge reports that he is still doing well with his nutrition. He states that his wife helps him choose heart healthy foods. He also is on a low salt diet.     Expected Outcome Short: continue heart healthy diet. Long: adhere to his heart healthy diet. Short: continue  with heart healthy eating patterns Long: maintain heart healthy diet independently Short: continue with heart healthy eating patterns. Long: maintain heart healthy diet independently.              Nutrition Goals Discharge (Final Nutrition Goals Re-Evaluation):  Nutrition Goals Re-Evaluation - 06/18/22 1004       Goals   Comment Caydence reports that he is still doing well with his nutrition. He states that his wife helps him choose heart healthy foods. He also is on a low salt diet.    Expected Outcome Short: continue with heart healthy eating patterns. Long: maintain heart healthy diet independently.             Psychosocial: Target Goals: Acknowledge presence or absence of significant depression and/or stress, maximize coping skills, provide positive support system. Participant is able to verbalize types and ability to use techniques and skills needed for reducing stress and depression.   Education: Stress, Anxiety, and Depression - Group verbal and visual presentation to define topics  covered.  Reviews how body is impacted by stress, anxiety, and depression.  Also discusses healthy ways to reduce stress and to treat/manage anxiety and depression.  Written material given at graduation. Flowsheet Row Cardiac Rehab from 09/13/2021 in [facility] Cardiac and Pulmonary Rehab  Date 09/13/21  Educator [facility]  Instruction Review Code 1- [employer] Understanding       Education: Sleep Hygiene -Provides group verbal and written instruction about how sleep can affect your health.  Define sleep hygiene, discuss sleep cycles and impact of sleep habits. Review good sleep hygiene tips.    Initial Review & Psychosocial Screening:  Initial Psych Review & Screening - 04/08/22 0937       Initial Review   Current issues with None Identified      Family Dynamics   Good Support System? Yes    Comments Ayomikun states that he has no issues with depression or anxiets and does not take anything for his mood. He can look to his wife, son, daughter and grandaughter for support.      Barriers   Psychosocial barriers to participate in program There are no identifiable barriers or psychosocial needs.;The patient should benefit from training in stress management and relaxation.      Screening Interventions   Interventions Encouraged to exercise;To provide support and resources with identified psychosocial needs;Provide feedback about the scores to participant    Expected Outcomes Long Term Goal: Stressors or current issues are controlled or eliminated.;Short Term goal: Utilizing psychosocial counselor, staff and physician to assist with identification of specific Stressors or current issues interfering with healing process. Setting desired goal for each stressor or current issue identified.;Short Term goal: Identification and review with participant of any Quality of Life or Depression concerns found by scoring the questionnaire.;Long Term goal: The participant improves quality of Life and PHQ9 Scores as seen by  post scores and/or verbalization of changes             Quality of Life Scores:   Quality of Life - 04/11/22 1055       Quality of Life   Select Quality of Life      Quality of Life Scores   Health/Function Pre 26.57 %    Socioeconomic Pre 25.63 %    Psych/Spiritual Pre 29.64 %    Family Pre 28.8 %    GLOBAL Pre 27.29 %            Scores of 19 and below usually indicate  a poorer quality of life in these areas.  A difference of  2-3 points is a clinically meaningful difference.  A difference of 2-3 points in the total score of the Quality of Life Index has been associated with significant improvement in overall quality of life, self-image, physical symptoms, and general health in studies assessing change in quality of life.  PHQ-9: Review Flowsheet  More data exists      04/11/2022 09/11/2021 04/03/2021 03/16/2021 02/01/2021  Depression screen PHQ 2/9  Decreased Interest 0 0 0 0 0  Down, Depressed, Hopeless 0 0 0 0 0  PHQ - 2 Score 0 0 0 0 0  Altered sleeping 1 0 1 - -  Tired, decreased energy 1 1 1  - -  Change in appetite 1 0 0 - -  Feeling bad or failure about yourself  0 0 0 - -  Trouble concentrating 1 0 0 - -  Moving slowly or fidgety/restless 0 0 0 - -  Suicidal thoughts 0 0 0 - -  PHQ-9 Score 4 1 2  - -  Difficult doing work/chores Somewhat difficult Not difficult at all Not difficult at all - -   Interpretation of Total Score  Total Score Depression Severity:  1-4 = Minimal depression, 5-9 = Mild depression, 10-14 = Moderate depression, 15-19 = Moderately severe depression, 20-27 = Severe depression   Psychosocial Evaluation and Intervention:  Psychosocial Evaluation - 04/08/22 0939       Psychosocial Evaluation & Interventions   Interventions Encouraged to exercise with the program and follow exercise prescription;Relaxation education;Stress management education    Comments Breydon states that he has no issues with depression or anxiets and does not take  anything for his mood. He can look to his wife, son, daughter and grandaughter for support.    Expected Outcomes Short: Attend HeartTrack stress management education to decrease stress. Long: Maintain exercise Post HeartTrack to keep stress at a minimum.    Continue Psychosocial Services  Follow up required by staff             Psychosocial Re-Evaluation:  Psychosocial Re-Evaluation     Row Name 05/01/22 [phone] 05/20/22 0959 06/18/22 0954         Psychosocial Re-Evaluation   Current issues with None Identified None Identified None Identified     Comments Patient reports no issues with their current mental states, sleep, stress, depression or anxiety. Will follow up with patient in a few weeks for any changes. Maxin reports no major stress concerns a this time, he relies on his wife for support as well as his son who lives close. Perryn reports that he and his wife likes to go on day trips and he enjoys staying active around the house and in his garden. He reports no issues with his sleep at this time. Benedetto reports no major stress concerns a this time. He reports that his wife and his son are good support for him. He enjoys going to the lake and fishing for stress relief. He also enjoys working in the garden. He states that he is overall sleeping well, but has been waking up earlier.     Expected Outcomes Short: Continue to exercise regularly to support mental health and notify staff of any changes. Long: maintain mental health and well being through teaching of rehab or prescribed medications independently. Short: Continue to exercise regularly to support mental health and notify staff of any changes. Long: maintain mental health and well being through teaching of rehab  or prescribed medications independently. Short: Continue to exercise to support mental health. Long: Continue to maintain positive outlook.     Interventions Encouraged to attend Cardiac Rehabilitation for the exercise Encouraged to  attend Cardiac Rehabilitation for the exercise Encouraged to attend Cardiac Rehabilitation for the exercise     Continue Psychosocial Services  Follow up required by staff Follow up required by staff Follow up required by staff              Psychosocial Discharge (Final Psychosocial Re-Evaluation):  Psychosocial Re-Evaluation - 06/18/22 0954       Psychosocial Re-Evaluation   Current issues with None Identified    Comments Raevon reports no major stress concerns a this time. He reports that his wife and his son are good support for him. He enjoys going to the lake and fishing for stress relief. He also enjoys working in the garden. He states that he is overall sleeping well, but has been waking up earlier.    Expected Outcomes Short: Continue to exercise to support mental health. Long: Continue to maintain positive outlook.    Interventions Encouraged to attend Cardiac Rehabilitation for the exercise    Continue Psychosocial Services  Follow up required by staff             Vocational Rehabilitation: Provide vocational rehab assistance to qualifying candidates.   Vocational Rehab Evaluation & Intervention:   Education: Education Goals: Education classes will be provided on a variety of topics geared toward better understanding of heart health and risk factor modification. Participant will state understanding/return demonstration of topics presented as noted by education test scores.  Learning Barriers/Preferences:  Learning Barriers/Preferences - 04/08/22 0935       Learning Barriers/Preferences   Learning Barriers None    Learning Preferences None             General Cardiac Education Topics:  AED/CPR: - Group verbal and written instruction with the use of models to demonstrate the basic use of the AED with the basic ABC's of resuscitation.   Anatomy and Cardiac Procedures: - Group verbal and visual presentation and models provide information about basic cardiac  anatomy and function. Reviews the testing methods done to diagnose heart disease and the outcomes of the test results. Describes the treatment choices: Medical Management, Angioplasty, or Coronary Bypass Surgery for treating various heart conditions including Myocardial Infarction, Angina, Valve Disease, and Cardiac Arrhythmias.  Written material given at graduation. Flowsheet Row Cardiac Rehab from 05/01/2022 in [facility] Cardiac and Pulmonary Rehab  Education need identified 04/11/22       Medication Safety: - Group verbal and visual instruction to review commonly prescribed medications for heart and lung disease. Reviews the medication, class of the drug, and side effects. Includes the steps to properly store meds and maintain the prescription regimen.  Written material given at graduation.   Intimacy: - Group verbal instruction through game format to discuss how heart and lung disease can affect sexual intimacy. Written material given at graduation..   Know Your Numbers and Heart Failure: - Group verbal and visual instruction to discuss disease risk factors for cardiac and pulmonary disease and treatment options.  Reviews associated critical values for Overweight/Obesity, Hypertension, Cholesterol, and Diabetes.  Discusses basics of heart failure: signs/symptoms and treatments.  Introduces Heart Failure Zone chart for action plan for heart failure.  Written material given at graduation. Flowsheet Row Cardiac Rehab from 09/13/2021 in [facility] Cardiac and Pulmonary Rehab  Date 08/30/21  Educator SB  Instruction  Review Code 1- Verbalizes Understanding       Infection Prevention: - Provides verbal and written material to individual with discussion of infection control including proper hand washing and proper equipment cleaning during exercise session. Flowsheet Row Cardiac Rehab from 05/01/2022 in [facility] Cardiac and Pulmonary Rehab  Date 04/08/22  Educator jh  Instruction Review Code 1- Verbalizes  Understanding       Falls Prevention: - Provides verbal and written material to individual with discussion of falls prevention and safety. Flowsheet Row Cardiac Rehab from 05/01/2022 in [facility] Cardiac and Pulmonary Rehab  Date 04/08/22  Educator jh  Instruction Review Code 1- Verbalizes Understanding       Other: -Provides group and verbal instruction on various topics (see comments)   Knowledge Questionnaire Score:  Knowledge Questionnaire Score - 04/11/22 1054       Knowledge Questionnaire Score   Pre Score 22/26             Core Components/Risk Factors/Patient Goals at Admission:  Personal Goals and Risk Factors at Admission - 04/11/22 1110       Core Components/Risk Factors/Patient Goals on Admission    Weight Management Yes;Weight Maintenance    Admit Weight 181 lb 12.8 oz (82.5 kg)    Goal Weight: Short Term 181 lb 12.8 oz (82.5 kg)    Goal Weight: Long Term 181 lb 12.8 oz (82.5 kg)    Expected Outcomes Short Term: Continue to assess and modify interventions until short term weight is achieved;Long Term: Adherence to nutrition and physical activity/exercise program aimed toward attainment of established weight goal;Weight Maintenance: Understanding of the daily nutrition guidelines, which includes 25-35% calories from fat, 7% or less cal from saturated fats, less than 200mg  cholesterol, less than 1.5gm of sodium, & 5 or more servings of fruits and vegetables daily;Understanding recommendations for meals to include 15-35% energy as protein, 25-35% energy from fat, 35-60% energy from carbohydrates, less than 200mg  of dietary cholesterol, 20-35 gm of total fiber daily;Understanding of distribution of calorie intake throughout the day with the consumption of 4-5 meals/snacks    Heart Failure Yes    Intervention Provide a combined exercise and nutrition program that is supplemented with education, support and counseling about heart failure. Directed toward relieving symptoms  such as shortness of breath, decreased exercise tolerance, and extremity edema.    Expected Outcomes Improve functional capacity of life;Short term: Attendance in program 2-3 days a week with increased exercise capacity. Reported lower sodium intake. Reported increased fruit and vegetable intake. Reports medication compliance.;Long term: Adoption of self-care skills and reduction of barriers for early signs and symptoms recognition and intervention leading to self-care maintenance.;Short term: Daily weights obtained and reported for increase. Utilizing diuretic protocols set by physician.    Hypertension Yes    Intervention Provide education on lifestyle modifcations including regular physical activity/exercise, weight management, moderate sodium restriction and increased consumption of fresh fruit, vegetables, and low fat dairy, alcohol moderation, and smoking cessation.;Monitor prescription use compliance.    Expected Outcomes Short Term: Continued assessment and intervention until BP is < 140/30mm HG in hypertensive participants. < 130/76mm HG in hypertensive participants with diabetes, heart failure or chronic kidney disease.;Long Term: Maintenance of blood pressure at goal levels.    Lipids Yes    Intervention Provide education and support for participant on nutrition & aerobic/resistive exercise along with prescribed medications to achieve LDL 70mg , HDL >40mg .    Expected Outcomes Short Term: Participant states understanding of desired cholesterol values and is compliant  with medications prescribed. Participant is following exercise prescription and nutrition guidelines.;Long Term: Cholesterol controlled with medications as prescribed, with individualized exercise RX and with personalized nutrition plan. Value goals: LDL < 70mg , HDL > 40 mg.             Education:Diabetes - Individual verbal and written instruction to review signs/symptoms of diabetes, desired ranges of glucose level fasting,  after meals and with exercise. Acknowledge that pre and post exercise glucose checks will be done for 3 sessions at entry of program.   Core Components/Risk Factors/Patient Goals Review:   Goals and Risk Factor Review     Row Name 05/01/22 0953 05/20/22 1000 06/18/22 0958         Core Components/Risk Factors/Patient Goals Review   Personal Goals Review Weight Management/Obesity;Hypertension Weight Management/Obesity;Hypertension Weight Management/Obesity;Hypertension     Review Dillen is doing well with his exercise. He is turning 89 years old this week. His blood pressure and weight is where it needs to be and he wants to maintain his weight. Latravis reports doing well in rehab. He takes his BP at home 2-3x/week running 120s/60s. He checks his weight regularly and is staying around 180lbs now. Rocklin states that he is comfortable with where his weight is at this time. He weighs in the mornings and states that his weight has been consistent around 178 lb. He also checks his BP every morning and states that it usually runs in the 120s/60s.     Expected Outcomes Short: maintain weight and blood pressure readings. Long: maintain weight and blood pressure readings independently. Short: continue checking BP and weight at home Long: continue to manage risk factors independently Short: Continue checking BP and weight at home. Long: Continue to manage lifestyle risk factors.              Core Components/Risk Factors/Patient Goals at Discharge (Final Review):   Goals and Risk Factor Review - 06/18/22 0958       Core Components/Risk Factors/Patient Goals Review   Personal Goals Review Weight Management/Obesity;Hypertension    Review Killian states that he is comfortable with where his weight is at this time. He weighs in the mornings and states that his weight has been consistent around 178 lb. He also checks his BP every morning and states that it usually runs in the 120s/60s.    Expected Outcomes Short:  Continue checking BP and weight at home. Long: Continue to manage lifestyle risk factors.             ITP Comments:  ITP Comments     Row Name 04/08/22 [phone] 04/11/22 1052 04/15/22 0928 05/08/22 0743 06/05/22 0752   ITP Comments In person Visit completed. Patient informed on EP and RD appointment and 6 Minute walk test. Patient also informed of patient health questionnaires on My Chart. Patient Verbalizes understanding. Visit diagnosis can be found in [facility] 03/25/2022. Completed and gym orientation. Initial ITP created and sent for review to Dr. Bethann Punches, Medical Director. First full day of exercise!  Patient was oriented to gym and equipment including functions, settings, policies, and procedures.  Patient's individual exercise prescription and treatment plan were reviewed.  All starting workloads were established based on the results of the 6 minute walk test done at initial orientation visit.  The plan for exercise progression was also introduced and progression will be customized based on patient's performance and goals. 30 day review completed. ITP sent to Dr. Bethann Punches, Medical Director of Cardiac Rehab. Continue with  ITP unless changes are made by physician. 30 Day review completed. Medical Director ITP review done, changes made as directed, and signed approval by Medical Director.    Row Name 07/03/22 0709           ITP Comments 30 Day review completed. Medical Director ITP review done, changes made as directed, and signed approval by Medical Director.                Comments:

## 2022-07-04 ENCOUNTER — Ambulatory Visit: Payer: Medicare Other | Admitting: Urology

## 2022-07-04 VITALS — BP 151/61 | HR 66 | Ht 70.0 in | Wt 182.0 lb

## 2022-07-04 DIAGNOSIS — Z9359 Other cystostomy status: Secondary | ICD-10-CM

## 2022-07-04 DIAGNOSIS — R339 Retention of urine, unspecified: Secondary | ICD-10-CM

## 2022-07-05 ENCOUNTER — Encounter: Payer: Medicare Other | Admitting: *Deleted

## 2022-07-05 DIAGNOSIS — I214 Non-ST elevation (NSTEMI) myocardial infarction: Secondary | ICD-10-CM | POA: Diagnosis not present

## 2022-07-05 DIAGNOSIS — Z955 Presence of coronary angioplasty implant and graft: Secondary | ICD-10-CM

## 2022-07-05 NOTE — Progress Notes (Signed)
Daily Session Note  Patient Details  Name: Shawn Andrews MRN: [id number] Date of Birth: Nov 11, 1928 Referring Provider:   Flowsheet Row Cardiac Rehab from 04/11/2022 in [facility] Cardiac and Pulmonary Rehab  Referring Provider Dr. Dorothyann Peng, MD       Encounter Date: 07/05/2022  Check In:  Session Check In - 07/05/22 1028       Check-In   Supervising physician immediately available to respond to emergencies See telemetry face sheet for immediately available ER MD    Location [facility]-Cardiac & Pulmonary Rehab    Staff Present Cora Collum, RN, BSN, CCRP;Joseph Hood, RCP,RRT,BSRT;Noah Tickle, [place], Exercise Physiologist    Virtual Visit No    Medication changes reported     No    Fall or balance concerns reported    No    Warm-up and Cool-down Performed on first and last piece of equipment    Resistance Training Performed Yes    VAD Patient? No    PAD/SET Patient? No      Pain Assessment   Currently in Pain? No/denies                Social History   Tobacco Use  Smoking Status Former   Packs/day: 0.00   Years: 0.00   Additional pack years: 0.00   Total pack years: 0.00   Types: Pipe, Cigarettes   Quit date: 12/26/2000   Years since quitting: 21.5   Passive exposure: Past  Smokeless Tobacco Current   Types: Chew  Tobacco Comments   quit over 30 years ago    Goals Met:  Independence with exercise equipment Exercise tolerated well No report of concerns or symptoms today  Goals Unmet:  Not Applicable  Comments: Pt able to follow exercise prescription today without complaint.  Will continue to monitor for progression.    Dr. Bethann Punches is Medical Director for [facility] Cardiac Rehabilitation.  Dr. Vida Rigger is Medical Director for [facility] Pulmonary Rehabilitation.

## 2022-07-09 ENCOUNTER — Ambulatory Visit: Payer: Medicare Other | Admitting: Urology

## 2022-07-10 ENCOUNTER — Ambulatory Visit: Payer: Medicare Other | Admitting: Urology

## 2022-07-10 ENCOUNTER — Encounter: Payer: Medicare Other | Admitting: *Deleted

## 2022-07-10 DIAGNOSIS — I214 Non-ST elevation (NSTEMI) myocardial infarction: Secondary | ICD-10-CM | POA: Diagnosis not present

## 2022-07-10 DIAGNOSIS — Z955 Presence of coronary angioplasty implant and graft: Secondary | ICD-10-CM

## 2022-07-10 NOTE — Progress Notes (Signed)
Daily Session Note  Patient Details  Name: Shawn Andrews MRN: [id number] Date of Birth: 1928-07-24 Referring Provider:   Flowsheet Row Cardiac Rehab from 04/11/2022 in [facility] Cardiac and Pulmonary Rehab  Referring Provider Dr. Dorothyann Peng, MD       Encounter Date: 07/10/2022  Check In:  Session Check In - 07/10/22 0958       Check-In   Supervising physician immediately available to respond to emergencies See telemetry face sheet for immediately available ER MD    Location [facility]-Cardiac & Pulmonary Rehab    Staff Present Lanny Hurst, RN, ADN;Joseph Reino Kent, RCP,RRT,BSRT;Kristen Coble, RN,BC,MSN    Virtual Visit No    Medication changes reported     No    Fall or balance concerns reported    No    Warm-up and Cool-down Performed on first and last piece of equipment    Resistance Training Performed Yes    VAD Patient? No    PAD/SET Patient? No      Pain Assessment   Currently in Pain? No/denies                Social History   Tobacco Use  Smoking Status Former   Packs/day: 0.00   Years: 0.00   Additional pack years: 0.00   Total pack years: 0.00   Types: Pipe, Cigarettes   Quit date: 12/26/2000   Years since quitting: 21.5   Passive exposure: Past  Smokeless Tobacco Current   Types: Chew  Tobacco Comments   quit over 30 years ago    Goals Met:  Independence with exercise equipment Exercise tolerated well No report of concerns or symptoms today Strength training completed today  Goals Unmet:  Not Applicable  Comments: Pt able to follow exercise prescription today without complaint.  Will continue to monitor for progression.    Dr. Bethann Punches is Medical Director for [facility] Cardiac Rehabilitation.  Dr. Vida Rigger is Medical Director for [facility] Pulmonary Rehabilitation.

## 2022-07-11 ENCOUNTER — Encounter: Payer: Medicare Other | Admitting: *Deleted

## 2022-07-11 DIAGNOSIS — Z955 Presence of coronary angioplasty implant and graft: Secondary | ICD-10-CM

## 2022-07-11 DIAGNOSIS — I214 Non-ST elevation (NSTEMI) myocardial infarction: Secondary | ICD-10-CM

## 2022-07-11 NOTE — Progress Notes (Signed)
Daily Session Note  Patient Details  Name: JSIAH VOLKERS MRN: [id number] Date of Birth: January 02, 1929 Referring Provider:   Flowsheet Row Cardiac Rehab from 04/11/2022 in [facility] Cardiac [facility] Pulmonary Rehab  Referring Provider Dr. Dorothyann Peng, MD       Encounter Date: 07/11/2022  Check In:  Session Check In - 07/11/22 0951       Check-In   Supervising physician immediately available to respond to emergencies See telemetry face sheet for immediately available ER MD    Location [facility]-Cardiac & Pulmonary Rehab    Staff Present Lanny Hurst, RN, ADN;Susanne Bice, RN, BSN, CCRP;Other   [place], MS   Virtual Visit No    Medication changes reported     No    Fall or balance concerns reported    No    Warm-up and Cool-down Performed on first and last piece of equipment    Resistance Training Performed Yes    VAD Patient? No    PAD/SET Patient? No      Pain Assessment   Currently in Pain? No/denies                Social History   Tobacco Use  Smoking Status Former   Packs/day: 0.00   Years: 0.00   Additional pack years: 0.00   Total pack years: 0.00   Types: Pipe, Cigarettes   Quit date: 12/26/2000   Years since quitting: 21.5   Passive exposure: Past  Smokeless Tobacco Current   Types: Chew  Tobacco Comments   quit over 30 years ago    Goals Met:  Independence with exercise equipment Exercise tolerated well No report of concerns or symptoms today Strength training completed today  Goals Unmet:  Not Applicable  Comments: Pt able to follow exercise prescription today without complaint.  Will continue to monitor for progression.    Dr. Bethann Punches is Medical Director for [facility] Cardiac Rehabilitation.  Dr. Vida Rigger is Medical Director for [facility] Pulmonary Rehabilitation.

## 2022-07-15 ENCOUNTER — Encounter: Payer: Medicare Other | Admitting: *Deleted

## 2022-07-15 VITALS — Ht 70.5 in | Wt 185.1 lb

## 2022-07-15 DIAGNOSIS — I214 Non-ST elevation (NSTEMI) myocardial infarction: Secondary | ICD-10-CM | POA: Diagnosis not present

## 2022-07-15 DIAGNOSIS — Z955 Presence of coronary angioplasty implant and graft: Secondary | ICD-10-CM

## 2022-07-15 NOTE — Progress Notes (Signed)
Daily Session Note  Patient Details  Name: Shawn Andrews MRN: [id number] Date of Birth: 09/07/1928 Referring Provider:   Flowsheet Row Cardiac Rehab from 04/11/2022 in [facility] Cardiac and Pulmonary Rehab  Referring Provider Dr. Dorothyann Peng, MD       Encounter Date: 07/15/2022  Check In:  Session Check In - 07/15/22 0930       Check-In   Supervising physician immediately available to respond to emergencies See telemetry face sheet for immediately available ER MD    Location [facility]-Cardiac & Pulmonary Rehab    Staff Present Lanny Hurst, RN, Franki Monte, BS, ACSM CEP, Exercise Physiologist;Kristen Coble, RN,BC,MSN    Virtual Visit No    Medication changes reported     No    Fall or balance concerns reported    No    Warm-up and Cool-down Performed on first and last piece of equipment    Resistance Training Performed Yes    VAD Patient? No    PAD/SET Patient? No      Pain Assessment   Currently in Pain? No/denies                Social History   Tobacco Use  Smoking Status Former   Packs/day: 0.00   Years: 0.00   Additional pack years: 0.00   Total pack years: 0.00   Types: Pipe, Cigarettes   Quit date: 12/26/2000   Years since quitting: 21.5   Passive exposure: Past  Smokeless Tobacco Current   Types: Chew  Tobacco Comments   quit over 30 years ago    Goals Met:  Independence with exercise equipment Exercise tolerated well No report of concerns or symptoms today Strength training completed today  Goals Unmet:  Not Applicable  Comments: Pt able to follow exercise prescription today without complaint.  Will continue to monitor for progression.   6 Minute Walk     Row Name 04/11/22 1327 07/15/22 0939       6 Minute Walk   Phase Initial Initial    Distance 910 feet 1100 feet    Distance % Change -- 21 %    Distance Feet Change -- 190 ft    Walk Time 6 minutes 6 minutes    # of Rest Breaks 0 0    MPH 1.72 2.08    METS 1.2 1.42    RPE 11 11     Perceived Dyspnea  1 0    VO2 Peak 4.19 4.78    Symptoms Yes (comment) Yes (comment)    Comments hip pain usual hip pain 3/10    Resting HR 57 bpm 54 bpm    Resting BP 138/60 122/76    Resting Oxygen Saturation  97 % 95 %    Exercise Oxygen Saturation  during 6 min walk 96 % 92 %    Max Ex. HR 82 bpm 87 bpm    Max Ex. BP 152/54 132/62    2 Minute Post BP 142/52 120/60                Dr. Bethann Punches is Medical Director for [facility] Cardiac Rehabilitation.  Dr. Vida Rigger is Medical Director for [facility] Pulmonary Rehabilitation.

## 2022-07-15 NOTE — Patient Instructions (Signed)
Discharge Patient Instructions  Patient Details  Name: Shawn Andrews MRN: [id number] Date of Birth: 07-24-1928 Referring Provider:  Mick Sell, MD   Number of Visits: 36  Reason for Discharge:  Patient reached a stable level of exercise. Patient independent in their exercise. Patient has met program and personal goals.   Diagnosis:  NSTEMI (non-ST elevated myocardial infarction) [facility])  Status post coronary artery stent placement  Initial Exercise Prescription:  Initial Exercise Prescription - 04/11/22 1300       Date of Initial Exercise RX and Referring Provider   Date 04/11/22    Referring Provider Dr. Dorothyann Peng, MD      Oxygen   Maintain Oxygen Saturation 88% or higher      Recumbant Bike   Level 2    Watts 15    Minutes 15    METs 1.2      NuStep   Level 2    Minutes 15    METs 1.2      Track   Laps 20    Minutes 15    METs 2.09      Prescription Details   Frequency (times per week) 2    Duration Progress to 30 minutes of continuous aerobic without signs/symptoms of physical distress      Intensity   THRR 40-80% of Max Heartrate 85-115    Ratings of Perceived Exertion 11-13    Perceived Dyspnea 0-4      Progression   Progression Continue to progress workloads to maintain intensity without signs/symptoms of physical distress.      Resistance Training   Training Prescription Yes    Weight 4 lb    Reps 10-15             Discharge Exercise Prescription (Final Exercise Prescription Changes):  Exercise Prescription Changes - 07/01/22 1500       Response to Exercise   Blood Pressure (Admit) 120/66    Blood Pressure (Exit) 108/52    Heart Rate (Admit) 55 bpm    Heart Rate (Exercise) 80 bpm    Heart Rate (Exit) 63 bpm    Oxygen Saturation (Admit) 92 %    Oxygen Saturation (Exercise) 97 %    Oxygen Saturation (Exit) 95 %    Rating of Perceived Exertion (Exercise) 13    Symptoms none    Duration Continue with 30 min of  aerobic exercise without signs/symptoms of physical distress.    Intensity THRR unchanged      Progression   Progression Continue to progress workloads to maintain intensity without signs/symptoms of physical distress.    Average METs 2.52      Resistance Training   Training Prescription Yes    Weight 4 lb    Reps 10-15      Interval Training   Interval Training No      Recumbant Bike   Level 3    Watts 20    Minutes 15    METs 2.74      NuStep   Level 5    Minutes 15    METs 2.5      Track   Laps 24    Minutes 15    METs 2.31      Home Exercise Plan   Plans to continue exercise at Home (comment)   walking   Frequency Add 3 additional days to program exercise sessions.    Initial Home Exercises Provided 05/01/22      Oxygen   Maintain  Oxygen Saturation 88% or higher             Functional Capacity:  6 Minute Walk     Row Name 04/11/22 1327 07/15/22 0939       6 Minute Walk   Phase Initial Initial    Distance 910 feet 1100 feet    Distance % Change -- 21 %    Distance Feet Change -- 190 ft    Walk Time 6 minutes 6 minutes    # of Rest Breaks 0 0    MPH 1.72 2.08    METS 1.2 1.42    RPE 11 11    Perceived Dyspnea  1 0    VO2 Peak 4.19 4.78    Symptoms Yes (comment) Yes (comment)    Comments hip pain usual hip pain 3/10    Resting HR 57 bpm 54 bpm    Resting BP 138/60 122/76    Resting Oxygen Saturation  97 % 95 %    Exercise Oxygen Saturation  during 6 min walk 96 % 92 %    Max Ex. HR 82 bpm 87 bpm    Max Ex. BP 152/54 132/62    2 Minute Post BP 142/52 120/60             Nutrition & Weight - Outcomes:  Pre Biometrics - 07/15/22 0944       Pre Biometrics   Height 5' 10.5" (1.791 m)    Weight 185 lb 1.6 oz (84 kg)    Waist Circumference 40 inches    Hip Circumference 41 inches    Waist to Hip Ratio 0.98 %    BMI (Calculated) 26.17    Single Leg Stand 11.16 seconds              Nutrition:  Nutrition Therapy & Goals -  04/11/22 1108       Intervention Plan   Intervention Prescribe, educate and counsel regarding individualized specific dietary modifications aiming towards targeted core components such as weight, hypertension, lipid management, diabetes, heart failure and other comorbidities.    Expected Outcomes Short Term Goal: Understand basic principles of dietary content, such as calories, fat, sodium, cholesterol and nutrients.;Short Term Goal: A plan has been developed with personal nutrition goals set during dietitian appointment.;Long Term Goal: Adherence to prescribed nutrition plan.             Goals reviewed with patient; copy given to patient.

## 2022-07-17 DIAGNOSIS — I214 Non-ST elevation (NSTEMI) myocardial infarction: Secondary | ICD-10-CM | POA: Diagnosis not present

## 2022-07-17 DIAGNOSIS — Z955 Presence of coronary angioplasty implant and graft: Secondary | ICD-10-CM

## 2022-07-17 NOTE — Progress Notes (Signed)
Daily Session Note  Patient Details  Name: Shawn Andrews MRN: [id number] Date of Birth: 1928-08-05 Referring Provider:   Flowsheet Row Cardiac Rehab from 04/11/2022 in [facility] Cardiac and Pulmonary Rehab  Referring Provider Dr. Dorothyann Peng, MD       Encounter Date: 07/17/2022  Check In:  Session Check In - 07/17/22 0915       Check-In   Supervising physician immediately available to respond to emergencies See telemetry face sheet for immediately available ER MD    Location [facility]-Cardiac & Pulmonary Rehab    Staff Present Lanny Hurst, RN, Silas Flood, BS, Exercise Physiologist;Kristen Coble, RN,BC,MSN    Virtual Visit No    Medication changes reported     No    Fall or balance concerns reported    No    Warm-up and Cool-down Performed on first and last piece of equipment    Resistance Training Performed Yes    VAD Patient? No    PAD/SET Patient? No      Pain Assessment   Currently in Pain? No/denies                Social History   Tobacco Use  Smoking Status Former   Packs/day: 0.00   Years: 0.00   Additional pack years: 0.00   Total pack years: 0.00   Types: Pipe, Cigarettes   Quit date: 12/26/2000   Years since quitting: 21.5   Passive exposure: Past  Smokeless Tobacco Current   Types: Chew  Tobacco Comments   quit over 30 years ago    Goals Met:  Independence with exercise equipment Exercise tolerated well No report of concerns or symptoms today Strength training completed today  Goals Unmet:  Not Applicable  Comments: Pt able to follow exercise prescription today without complaint.  Will continue to monitor for progression.    Dr. Bethann Punches is Medical Director for [facility] Cardiac Rehabilitation.  Dr. Vida Rigger is Medical Director for [facility] Pulmonary Rehabilitation.

## 2022-07-19 DIAGNOSIS — I214 Non-ST elevation (NSTEMI) myocardial infarction: Secondary | ICD-10-CM

## 2022-07-19 DIAGNOSIS — Z955 Presence of coronary angioplasty implant and graft: Secondary | ICD-10-CM

## 2022-07-19 NOTE — Progress Notes (Signed)
Cardiac Individual Treatment Plan  Patient Details  Name: Shawn Andrews MRN: [id number] Date of Birth: April 29, 1928 Referring Provider:   Flowsheet Row Cardiac Rehab from 04/11/2022 in [facility] Cardiac and Pulmonary Rehab  Referring Provider Dr. Dorothyann Peng, MD       Initial Encounter Date:  Flowsheet Row Cardiac Rehab from 04/11/2022 in [facility] Cardiac and Pulmonary Rehab  Date 04/11/22       Visit Diagnosis: NSTEMI (non-ST elevated myocardial infarction) [facility])  Status post coronary artery stent placement  Patient's Home Medications on Admission:  Current Outpatient Medications:    acetaminophen (TYLENOL 8 HOUR) 650 MG CR tablet, Take 1 tablet (650 mg total) by mouth 2 (two) times a day., Disp: , Rfl:    aspirin 81 MG chewable tablet, Chew 1 tablet by mouth daily., Disp: , Rfl:    Cholecalciferol (VITAMIN D3) 25 MCG (1000 UT) CAPS, Take 1 capsule (1,000 Units total) by mouth daily., Disp: 30 capsule, Rfl:    clopidogrel (PLAVIX) 75 MG tablet, Take 75 mg by mouth daily., Disp: , Rfl:    cyanocobalamin 1000 MCG tablet, Take 1,000 mcg by mouth daily., Disp: , Rfl:    docusate sodium (COLACE) 100 MG capsule, Take 1 capsule (100 mg total) by mouth daily., Disp: , Rfl:    famotidine (PEPCID) 20 MG tablet, TAKE 1 TABLET BY MOUTH AT  BEDTIME (Patient taking differently: Take 20 mg by mouth at bedtime.), Disp: 90 tablet, Rfl: 3   finasteride (PROSCAR) 5 MG tablet, TAKE 1 TABLET BY MOUTH  DAILY (Patient taking differently: Take 5 mg by mouth daily.), Disp: 90 tablet, Rfl: 3   furosemide (LASIX) 20 MG tablet, Take 1 tablet by mouth every other day., Disp: , Rfl:    hydrALAZINE (APRESOLINE) 50 MG tablet, Take 1 tablet (50 mg total) by mouth every 8 (eight) hours., Disp: 90 tablet, Rfl: 0   isosorbide mononitrate (IMDUR) 60 MG 24 hr tablet, Take 1 tablet (60 mg total) by mouth 2 (two) times daily., Disp: 60 tablet, Rfl: 1   loratadine (CLARITIN) 10 MG tablet, Take 1 tablet (10 mg total) by mouth  daily as needed for allergies., Disp: 30 tablet, Rfl:    metoprolol tartrate (LOPRESSOR) 50 MG tablet, Take 0.5 tablets (25 mg total) by mouth 2 (two) times daily., Disp: , Rfl:    nitroGLYCERIN (NITROSTAT) 0.4 MG SL tablet, Place 1 tablet (0.4 mg total) under the tongue every 5 (five) minutes x 3 doses as needed for chest pain., Disp: 30 tablet, Rfl: 12   pantoprazole (PROTONIX) 40 MG tablet, Take 40 mg by mouth 2 (two) times daily., Disp: , Rfl:    polyethylene glycol powder (GLYCOLAX/MIRALAX) 17 GM/SCOOP powder, Take 8.5-17 g by mouth daily as needed for moderate constipation., Disp: 3350 g, Rfl: 0   primidone (MYSOLINE) 50 MG tablet, Take 2 tablets (100 mg total) by mouth 3 (three) times daily., Disp: , Rfl:    rosuvastatin (CRESTOR) 40 MG tablet, Take 1 tablet (40 mg total) by mouth at bedtime., Disp: 30 tablet, Rfl: 2  Past Medical History: Past Medical History:  Diagnosis Date   Abnormal drug screen 06/22/2014   inapprop neg xanax rpt 3 mo (06/2014)   BPH (benign prostatic hypertrophy) 01/21/1998   has had 3 biopsies in past ([facility]) decided to stop PSA/DRE   CHF (congestive heart failure) (HCC)    Coronary artery disease    Hyperlipidemia 01/21/2002   Hypertension 05/22/2003   Left lumbar radiculopathy    Osteoarthritis 01/22/1988  knees, lumbar spondylosis and listhesis    Tobacco Use: Social History   Tobacco Use  Smoking Status Former   Packs/day: 0.00   Years: 0.00   Additional pack years: 0.00   Total pack years: 0.00   Types: Pipe, Cigarettes   Quit date: 12/26/2000   Years since quitting: 21.5   Passive exposure: Past  Smokeless Tobacco Current   Types: Chew  Tobacco Comments   quit over 30 years ago    Labs: Review Flowsheet  More data exists      Latest Ref Rng & Units 08/18/2018 08/25/2019 08/25/2020 01/17/2021 01/18/2021  Labs for ITP Cardiac and Pulmonary Rehab  Cholestrol 0 - 200 mg/dL 782  956  213  - 086   LDL (calc) 0 - 99 mg/dL 578  469  629  -  99   HDL-C >40 mg/dL 52.84  13.24  40.10  - 45   Trlycerides <150 mg/dL 27.2  536.6  440.3  - 66   Hemoglobin A1c 4.8 - 5.6 % - - - 5.0  -     Exercise Target Goals: Exercise Program Goal: Individual exercise prescription set using results from initial 6 min walk test and THRR while considering  patient's activity barriers and safety.   Exercise Prescription Goal: Initial exercise prescription builds to 30-45 minutes a day of aerobic activity, 2-3 days per week.  Home exercise guidelines will be given to patient during program as part of exercise prescription that the participant will acknowledge.   Education: Aerobic Exercise: - Group verbal and visual presentation on the components of exercise prescription. Introduces F.I.T.T principle from ACSM for exercise prescriptions.  Reviews F.I.T.T. principles of aerobic exercise including progression. Written material given at graduation. Flowsheet Row Cardiac Rehab from 07/11/2022 in [facility] Cardiac and Pulmonary Rehab  Education need identified 04/11/22       Education: Resistance Exercise: - Group verbal and visual presentation on the components of exercise prescription. Introduces F.I.T.T principle from ACSM for exercise prescriptions  Reviews F.I.T.T. principles of resistance exercise including progression. Written material given at graduation. Flowsheet Row Cardiac Rehab from 07/11/2022 in [facility] Cardiac and Pulmonary Rehab  Date 07/11/22  Educator NT  Instruction Review Code 1- Verbalizes Understanding        Education: Exercise & Equipment Safety: - Individual verbal instruction and demonstration of equipment use and safety with use of the equipment. Flowsheet Row Cardiac Rehab from 07/11/2022 in [facility] Cardiac and Pulmonary Rehab  Education need identified 04/08/22  Date 04/08/22  Educator jh  Instruction Review Code 1- Verbalizes Understanding       Education: Exercise Physiology & General Exercise Guidelines: - Group verbal  and written instruction with models to review the exercise physiology of the cardiovascular system and associated critical values. Provides general exercise guidelines with specific guidelines to those with heart or lung disease.  Flowsheet Row Cardiac Rehab from 09/13/2021 in [facility] Cardiac and Pulmonary Rehab  Education need identified 04/03/21       Education: Flexibility, Balance, Mind/Body Relaxation: - Group verbal and visual presentation with interactive activity on the components of exercise prescription. Introduces F.I.T.T principle from ACSM for exercise prescriptions. Reviews F.I.T.T. principles of flexibility and balance exercise training including progression. Also discusses the mind body connection.  Reviews various relaxation techniques to help reduce and manage stress (i.e. Deep breathing, progressive muscle relaxation, and visualization). Balance handout provided to take home. Written material given at graduation. Flowsheet Row Cardiac Rehab from 07/11/2022 in [facility] Cardiac and Pulmonary Rehab  Date 07/11/22  Educator NT  Instruction Review Code 1- Verbalizes Understanding       Activity Barriers & Risk Stratification:  Activity Barriers & Cardiac Risk Stratification - 04/11/22 1332       Activity Barriers & Cardiac Risk Stratification   Activity Barriers Decreased Ventricular Function;Joint Problems    Comments Suprapubic catheter- reports no limitation or problems with movement or activity, R Shoulder pain, L hip pain    Cardiac Risk Stratification High             6 Minute Walk:  6 Minute Walk     Row Name 04/11/22 1327 07/15/22 0939       6 Minute Walk   Phase Initial Initial    Distance 910 feet 1100 feet    Distance % Change -- 21 %    Distance Feet Change -- 190 ft    Walk Time 6 minutes 6 minutes    # of Rest Breaks 0 0    MPH 1.72 2.08    METS 1.2 1.42    RPE 11 11    Perceived Dyspnea  1 0    VO2 Peak 4.19 4.78    Symptoms Yes (comment) Yes  (comment)    Comments hip pain usual hip pain 3/10    Resting HR 57 bpm 54 bpm    Resting BP 138/60 122/76    Resting Oxygen Saturation  97 % 95 %    Exercise Oxygen Saturation  during 6 min walk 96 % 92 %    Max Ex. HR 82 bpm 87 bpm    Max Ex. BP 152/54 132/62    2 Minute Post BP 142/52 120/60             Oxygen Initial Assessment:   Oxygen Re-Evaluation:   Oxygen Discharge (Final Oxygen Re-Evaluation):   Initial Exercise Prescription:  Initial Exercise Prescription - 04/11/22 1300       Date of Initial Exercise RX and Referring Provider   Date 04/11/22    Referring Provider Dr. Dorothyann Peng, MD      Oxygen   Maintain Oxygen Saturation 88% or higher      Recumbant Bike   Level 2    Watts 15    Minutes 15    METs 1.2      NuStep   Level 2    Minutes 15    METs 1.2      Track   Laps 20    Minutes 15    METs 2.09      Prescription Details   Frequency (times per week) 2    Duration Progress to 30 minutes of continuous aerobic without signs/symptoms of physical distress      Intensity   THRR 40-80% of Max Heartrate 85-115    Ratings of Perceived Exertion 11-13    Perceived Dyspnea 0-4      Progression   Progression Continue to progress workloads to maintain intensity without signs/symptoms of physical distress.      Resistance Training   Training Prescription Yes    Weight 4 lb    Reps 10-15             Perform Capillary Blood Glucose checks as needed.  Exercise Prescription Changes:   Exercise Prescription Changes     Row Name 04/11/22 1300 04/22/22 1400 05/01/22 1000 05/06/22 1100 05/20/22 1400     Response to Exercise   Blood Pressure (Admit) 138/60 108/54 -- 112/62 120/55   Blood  Pressure (Exercise) 152/54 130/68 -- -- 154/60   Blood Pressure (Exit) 142/52 116/68 -- 100/60 108/54   Heart Rate (Admit) 57 bpm 57 bpm -- 60 bpm 57 bpm   Heart Rate (Exercise) 82 bpm 83 bpm -- 97 bpm 94 bpm   Heart Rate (Exit) 60 bpm 65 bpm -- 61  bpm 66 bpm   Oxygen Saturation (Admit) 97 % -- -- -- --   Oxygen Saturation (Exercise) 96 % -- -- -- --   Rating of Perceived Exertion (Exercise) 11 13 -- 13 13   Perceived Dyspnea (Exercise) 1 -- -- -- --   Symptoms hip pain none -- none none   Comments Results First two days of exercise -- -- --   Duration -- Continue with 30 min of aerobic exercise without signs/symptoms of physical distress. -- Continue with 30 min of aerobic exercise without signs/symptoms of physical distress. Continue with 30 min of aerobic exercise without signs/symptoms of physical distress.   Intensity -- THRR unchanged -- THRR unchanged THRR unchanged     Progression   Progression -- Continue to progress workloads to maintain intensity without signs/symptoms of physical distress. -- Continue to progress workloads to maintain intensity without signs/symptoms of physical distress. Continue to progress workloads to maintain intensity without signs/symptoms of physical distress.   Average METs -- 2.8 -- 2.58 2.31     Resistance Training   Training Prescription -- Yes -- Yes Yes   Weight -- 4 lb -- 4 lb 4 lb   Reps -- 10-15 -- 10-15 10-15     Interval Training   Interval Training -- No -- No No     Recumbant Bike   Level -- 2 -- 3 --   Watts -- 15 -- 15 --   Minutes -- 15 -- 15 --   METs -- 3.1 -- 2.57 --     NuStep   Level -- 5 -- 5 5   Minutes -- 15 -- 15 15   METs -- 3.3 -- 3 3.4     T5 Nustep   Level -- -- -- 4 3   Minutes -- -- -- 15 15   METs -- -- -- 1.9 1.9     Biostep-RELP   Level -- -- -- 4 --   Minutes -- -- -- 15 --   METs -- -- -- 3 --     Track   Laps -- 13 -- 25 22   Minutes -- 10 -- 15 15   METs -- 1.71 -- 2.36 2.2     Home Exercise Plan   Plans to continue exercise at -- -- Home (comment)  walking Home (comment)  walking Home (comment)  walking   Frequency -- -- Add 3 additional days to program exercise sessions. Add 3 additional days to program exercise sessions. Add 3  additional days to program exercise sessions.   Initial Home Exercises Provided -- -- 05/01/22 05/01/22 05/01/22     Oxygen   Maintain Oxygen Saturation -- 88% or higher -- 88% or higher 88% or higher    Row Name 06/03/22 1000 06/18/22 0700 07/01/22 1500 07/15/22 1400       Response to Exercise   Blood Pressure (Admit) 104/60 128/66 120/66 110/58    Blood Pressure (Exit) 96/58 112/56 108/52 112/62    Heart Rate (Admit) 57 bpm 53 bpm 55 bpm 77 bpm    Heart Rate (Exercise) 91 bpm 95 bpm 80 bpm 84 bpm    Heart  Rate (Exit) 56 bpm 58 bpm 63 bpm 62 bpm    Oxygen Saturation (Admit) -- -- 92 % --    Oxygen Saturation (Exercise) -- -- 97 % --    Oxygen Saturation (Exit) -- -- 95 % --    Rating of Perceived Exertion (Exercise) 15 13 13 13     Symptoms none none none none    Duration Continue with 30 min of aerobic exercise without signs/symptoms of physical distress. Continue with 30 min of aerobic exercise without signs/symptoms of physical distress. Continue with 30 min of aerobic exercise without signs/symptoms of physical distress. Continue with 30 min of aerobic exercise without signs/symptoms of physical distress.    Intensity THRR unchanged THRR unchanged THRR unchanged THRR unchanged      Progression   Progression Continue to progress workloads to maintain intensity without signs/symptoms of physical distress. Continue to progress workloads to maintain intensity without signs/symptoms of physical distress. Continue to progress workloads to maintain intensity without signs/symptoms of physical distress. Continue to progress workloads to maintain intensity without signs/symptoms of physical distress.    Average METs 2.29 1.92 2.52 2.41      Resistance Training   Training Prescription Yes Yes Yes Yes    Weight 4 lb 4 lb 4 lb 4 lb    Reps 10-15 10-15 10-15 10-15      Interval Training   Interval Training No No No No      Recumbant Bike   Level 4 -- 3 3    Watts -- -- 20 --    Minutes  15 -- 15 15    METs 2.57 -- 2.74 2.95      NuStep   Level 4 4 5 6     Minutes 15 15 15 15     METs 2.5 2.4 2.5 --      REL-XR   Level 1 -- -- --    Minutes 15 -- -- --      T5 Nustep   Level -- 4 -- --    Minutes -- 15 -- --    METs -- 1.9 -- --      Biostep-RELP   Level 4 4 -- 3    Minutes 15 15 -- 15    METs 3 2 -- 2      Track   Laps 23 20 24 24     Minutes 15 15 15 15     METs 2.25 2.09 2.31 2.31      Home Exercise Plan   Plans to continue exercise at Home (comment)  walking Home (comment)  walking Home (comment)  walking Home (comment)  walking    Frequency Add 3 additional days to program exercise sessions. Add 3 additional days to program exercise sessions. Add 3 additional days to program exercise sessions. Add 3 additional days to program exercise sessions.    Initial Home Exercises Provided 05/01/22 05/01/22 05/01/22 05/01/22      Oxygen   Maintain Oxygen Saturation 88% or higher 88% or higher 88% or higher 88% or higher             Exercise Comments:   Exercise Comments     Row Name 04/15/22 1610           Exercise Comments First full day of exercise!  Patient was oriented to gym and equipment including functions, settings, policies, and procedures.  Patient's individual exercise prescription and treatment plan were reviewed.  All starting workloads were established based on the results of the  6 minute walk test done at initial orientation visit.  The plan for exercise progression was also introduced and progression will be customized based on patient's performance and goals.                Exercise Goals and Review:   Exercise Goals     Row Name 04/11/22 1334             Exercise Goals   Increase Physical Activity Yes       Intervention Provide advice, education, support and counseling about physical activity/exercise needs.;Develop an individualized exercise prescription for aerobic and resistive training based on initial evaluation  findings, risk stratification, comorbidities and participant's personal goals.       Expected Outcomes Short Term: Attend rehab on a regular basis to increase amount of physical activity.;Long Term: Add in home exercise to make exercise part of routine and to increase amount of physical activity.;Long Term: Exercising regularly at least 3-5 days a week.       Increase Strength and Stamina Yes       Intervention Provide advice, education, support and counseling about physical activity/exercise needs.;Develop an individualized exercise prescription for aerobic and resistive training based on initial evaluation findings, risk stratification, comorbidities and participant's personal goals.       Expected Outcomes Short Term: Increase workloads from initial exercise prescription for resistance, speed, and METs.;Short Term: Perform resistance training exercises routinely during rehab and add in resistance training at home;Long Term: Improve cardiorespiratory fitness, muscular endurance and strength as measured by increased METs and functional capacity ( )       Able to understand and use rate of perceived exertion (RPE) scale Yes       Intervention Provide education and explanation on how to use RPE scale       Expected Outcomes Short Term: Able to use RPE daily in rehab to express subjective intensity level;Long Term:  Able to use RPE to guide intensity level when exercising independently       Able to understand and use Dyspnea scale Yes       Intervention Provide education and explanation on how to use Dyspnea scale       Expected Outcomes Short Term: Able to use Dyspnea scale daily in rehab to express subjective sense of shortness of breath during exertion;Long Term: Able to use Dyspnea scale to guide intensity level when exercising independently       Knowledge and understanding of Target Heart Rate Range (THRR) Yes       Intervention Provide education and explanation of THRR including how the numbers  were predicted and where they are located for reference       Expected Outcomes Short Term: Able to use daily as guideline for intensity in rehab;Short Term: Able to state/look up THRR;Long Term: Able to use THRR to govern intensity when exercising independently       Able to check pulse independently Yes       Intervention Provide education and demonstration on how to check pulse in carotid and radial arteries.;Review the importance of being able to check your own pulse for safety during independent exercise       Expected Outcomes Short Term: Able to explain why pulse checking is important during independent exercise;Long Term: Able to check pulse independently and accurately       Understanding of Exercise Prescription Yes       Intervention Provide education, explanation, and written materials on patient's individual exercise prescription  Expected Outcomes Short Term: Able to explain program exercise prescription;Long Term: Able to explain home exercise prescription to exercise independently                Exercise Goals Re-Evaluation :  Exercise Goals Re-Evaluation     Row Name 04/15/22 7425 04/22/22 1432 05/01/22 1011 05/06/22 1156 05/20/22 1002     Exercise Goal Re-Evaluation   Exercise Goals Review Able to understand and use rate of perceived exertion (RPE) scale;Able to understand and use Dyspnea scale;Knowledge and understanding of Target Heart Rate Range (THRR);Understanding of Exercise Prescription Increase Physical Activity;Increase Strength and Stamina;Understanding of Exercise Prescription Increase Physical Activity;Increase Strength and Stamina;Understanding of Exercise Prescription;Able to understand and use rate of perceived exertion (RPE) scale;Able to understand and use Dyspnea scale;Knowledge and understanding of Target Heart Rate Range (THRR);Able to check pulse independently Increase Physical Activity;Increase Strength and Stamina;Understanding of Exercise  Prescription Increase Physical Activity;Increase Strength and Stamina;Understanding of Exercise Prescription   Comments Reviewed RPE scale, THR and program prescription with pt today.  Pt voiced understanding and was given a copy of goals to take home. Demir is off to a good start in the program. During his first two sessions of rehab he was able to work at level 5 on the T4 nustep and level 2 on the recumbent bike. He also was able to walk 13 laps on the track in 10 minutes. We will continue to monitor his progress in the program. Reviewed home exercise with pt today.  Pt plans to walk at home for exercise.  Reviewed THR, pulse, RPE, sign and symptoms, pulse oximetery and when to call 911 or MD.  Also discussed weather considerations and indoor options.  Pt voiced understanding. Davian continues to do well in rehab. He increased to 25 laps on the track which is his highest yet. He also incresed to level 3 on the recumbent bike, we hope to see his watts improve over time. He is doing well at level 5 on the T4 Nustep and  reaches his THR some sessions. Will continue to monitor. Delroy reports walking at home for exercise and stays active around the house as well as in his garden.   Expected Outcomes Short: Use RPE daily to regulate intensity. Long: Follow program prescription in THR. Short: Continue to follow current exercise prescription. Long: Continue to improve strength and stamina. Short: Start to walk again at home Long: Continue to exercise independently Short: Increase watts on recumbent bike Long: Continue to increase overall MET level and stamina Short: continue to walk at home Long: Continue to exercise independently    Row Name 05/20/22 1447 06/03/22 1019 06/18/22 0742 06/18/22 0948 07/01/22 1525     Exercise Goal Re-Evaluation   Exercise Goals Review Increase Physical Activity;Increase Strength and Stamina;Understanding of Exercise Prescription Increase Physical Activity;Increase Strength and  Stamina;Understanding of Exercise Prescription Increase Physical Activity;Increase Strength and Stamina;Understanding of Exercise Prescription Increase Physical Activity;Increase Strength and Stamina;Understanding of Exercise Prescription Increase Physical Activity;Increase Strength and Stamina;Understanding of Exercise Prescription   Comments Kahle is doing well in rehab. He has continued to consistently walk above 20 laps on the track. He also has continued to work at level 3 on the T5 nustep and level 5 on the T4 nustep. He has done well with 4 lb hand weights for resistance training as well. We will continue to monitor his progress. Tayshaun continues to do well in rehab. He ranges walking between 20-25 laps on the track, our goal is for him  to reach up to 30 overall. He did increase to level 4 on the recumbent bike and tried out the XR for the first time at level 1. He could benefit from increasing his level on the XR as he appears to be at level 4 on other seated machines, such as both the T4 and Biostep . He does reach his THR most sessions. We will continue to monitor. Pepe is doing well in rehab. He continues to walk up to 20 laps on the track. He also continues to work at level 4 on the biostep, T5 nustep, and T4 nustep. He has continued to use 4 lb hand weights for resistance training as well. We will continue to monitor his progress in the program. Jhonathon reports doing well with exercise. He states that he has been increasing his workloads in the program. He also states that he has improved his strength and stamina. He also reports that his left hip pain has limited him with exercise. He states that he has been staying active and does not do a lot of sitting around. He also walks at home three days a week. We will continue to monitor his progress. Mohab continues to do well in rehab.  He is up to 24 laps this week, usually around 13-15 laps.  He is also on level 3 on the bike.  We will continue to monitor his  progress.   Expected Outcomes Short: Continue to push for more laps on the track. Long: Continue to improve strength and stamina. Short: Increase level on XR to 2, strive to hit 30 laps on track Long: Continue to increase overall MET level and stamina Short: Continue to push for more laps on the track. Long: Continue to improve strength and stamina. Short: Continue to walk at home on days away from rehab. Long: Continue to improve strength and stamina. Short: Continue to walk more Long: continue to improve stamina    Row Name 07/15/22 1455             Exercise Goal Re-Evaluation   Exercise Goals Review Increase Physical Activity;Increase Strength and Stamina;Understanding of Exercise Prescription       Comments Rannon is doing well in rehab and is close to graduating. He recently completed his post and improved by 21%! He also has continued to walk up to 24 laps on the track and improved to level 6 on the T4 nustep. We will continue to monitor his progress until he graduates from the program.       Expected Outcomes Short: Graduate. Long: Continue to exercise independently.                Discharge Exercise Prescription (Final Exercise Prescription Changes):  Exercise Prescription Changes - 07/15/22 1400       Response to Exercise   Blood Pressure (Admit) 110/58    Blood Pressure (Exit) 112/62    Heart Rate (Admit) 77 bpm    Heart Rate (Exercise) 84 bpm    Heart Rate (Exit) 62 bpm    Rating of Perceived Exertion (Exercise) 13    Symptoms none    Duration Continue with 30 min of aerobic exercise without signs/symptoms of physical distress.    Intensity THRR unchanged      Progression   Progression Continue to progress workloads to maintain intensity without signs/symptoms of physical distress.    Average METs 2.41      Resistance Training   Training Prescription Yes    Weight 4 lb  Reps 10-15      Interval Training   Interval Training No      Recumbant Bike   Level  3    Minutes 15    METs 2.95      NuStep   Level 6    Minutes 15      Biostep-RELP   Level 3    Minutes 15    METs 2      Track   Laps 24    Minutes 15    METs 2.31      Home Exercise Plan   Plans to continue exercise at Home (comment)   walking   Frequency Add 3 additional days to program exercise sessions.    Initial Home Exercises Provided 05/01/22      Oxygen   Maintain Oxygen Saturation 88% or higher             Nutrition:  Target Goals: Understanding of nutrition guidelines, daily intake of sodium 1500mg , cholesterol 200mg , calories 30% from fat and 7% or less from saturated fats, daily to have 5 or more servings of fruits and vegetables.  Education: All About Nutrition: -Group instruction provided by verbal, written material, interactive activities, discussions, models, and posters to present general guidelines for heart healthy nutrition including fat, fiber, MyPlate, the role of sodium in heart healthy nutrition, utilization of the nutrition label, and utilization of this knowledge for meal planning. Follow up email sent as well. Written material given at graduation. Flowsheet Row Cardiac Rehab from 07/11/2022 in [facility] Cardiac and Pulmonary Rehab  Education need identified 04/11/22       Biometrics:  Pre Biometrics - 07/15/22 0944       Pre Biometrics   Height 5' 10.5" (1.791 m)    Weight 185 lb 1.6 oz (84 kg)    Waist Circumference 40 inches    Hip Circumference 41 inches    Waist to Hip Ratio 0.98 %    BMI (Calculated) 26.17    Single Leg Stand 11.16 seconds              Nutrition Therapy Plan and Nutrition Goals:  Nutrition Therapy & Goals - 04/11/22 1108       Intervention Plan   Intervention Prescribe, educate and counsel regarding individualized specific dietary modifications aiming towards targeted core components such as weight, hypertension, lipid management, diabetes, heart failure and other comorbidities.    Expected Outcomes  Short Term Goal: Understand basic principles of dietary content, such as calories, fat, sodium, cholesterol and nutrients.;Short Term Goal: A plan has been developed with personal nutrition goals set during dietitian appointment.;Long Term Goal: Adherence to prescribed nutrition plan.             Nutrition Assessments:  MEDIFICTS Score Key: ?70 Need to make dietary changes  40-70 Heart Healthy Diet ? 40 Therapeutic Level Cholesterol Diet  Flowsheet Row Cardiac Rehab from 07/17/2022 in [facility] Cardiac and Pulmonary Rehab  Picture Your Plate Total Score on Discharge 74      Picture Your Plate Scores: <16 Unhealthy dietary pattern with much room for improvement. 41-50 Dietary pattern unlikely to meet recommendations for good health and room for improvement. 51-60 More healthful dietary pattern, with some room for improvement.  >60 Healthy dietary pattern, although there may be some specific behaviors that could be improved.    Nutrition Goals Re-Evaluation:  Nutrition Goals Re-Evaluation     Row Name 05/01/22 [phone] 05/20/22 0955 06/18/22 1004  Goals   Current Weight 182 lb (82.6 kg) -- --     Nutrition Goal Continue to eat heart healthy Short: continue with heart healthy eating patterns Long: maintain heart healthy diet independently --     Comment Mikle does not want to meet with RD. He has a good hold on his diet, eats lots of fruits and does not over eat. Aslam reports doing well with his nutrition. He reports that his wife takes care of him and has him on a low salt diet and continues to choose heart healthy foods. He would not like to meet with the dietitian at this time and reports no questions or concerns at this time. Brayzen reports that he is still doing well with his nutrition. He states that his wife helps him choose heart healthy foods. He also is on a low salt diet.     Expected Outcome Short: continue heart healthy diet. Long: adhere to his heart healthy diet. Short:  continue with heart healthy eating patterns Long: maintain heart healthy diet independently Short: continue with heart healthy eating patterns. Long: maintain heart healthy diet independently.              Nutrition Goals Discharge (Final Nutrition Goals Re-Evaluation):  Nutrition Goals Re-Evaluation - 06/18/22 1004       Goals   Comment Saburo reports that he is still doing well with his nutrition. He states that his wife helps him choose heart healthy foods. He also is on a low salt diet.    Expected Outcome Short: continue with heart healthy eating patterns. Long: maintain heart healthy diet independently.             Psychosocial: Target Goals: Acknowledge presence or absence of significant depression and/or stress, maximize coping skills, provide positive support system. Participant is able to verbalize types and ability to use techniques and skills needed for reducing stress and depression.   Education: Stress, Anxiety, and Depression - Group verbal and visual presentation to define topics covered.  Reviews how body is impacted by stress, anxiety, and depression.  Also discusses healthy ways to reduce stress and to treat/manage anxiety and depression.  Written material given at graduation. Flowsheet Row Cardiac Rehab from 09/13/2021 in [facility] Cardiac and Pulmonary Rehab  Date 09/13/21  Educator [facility]  Instruction Review Code 1- [employer] Understanding       Education: Sleep Hygiene -Provides group verbal and written instruction about how sleep can affect your health.  Define sleep hygiene, discuss sleep cycles and impact of sleep habits. Review good sleep hygiene tips.    Initial Review & Psychosocial Screening:  Initial Psych Review & Screening - 04/08/22 0937       Initial Review   Current issues with None Identified      Family Dynamics   Good Support System? Yes    Comments Dijohn states that he has no issues with depression or anxiets and does not take anything for  his mood. He can look to his wife, son, daughter and grandaughter for support.      Barriers   Psychosocial barriers to participate in program There are no identifiable barriers or psychosocial needs.;The patient should benefit from training in stress management and relaxation.      Screening Interventions   Interventions Encouraged to exercise;To provide support and resources with identified psychosocial needs;Provide feedback about the scores to participant    Expected Outcomes Long Term Goal: Stressors or current issues are controlled or eliminated.;Short Term goal: Utilizing psychosocial  counselor, staff and physician to assist with identification of specific Stressors or current issues interfering with healing process. Setting desired goal for each stressor or current issue identified.;Short Term goal: Identification and review with participant of any Quality of Life or Depression concerns found by scoring the questionnaire.;Long Term goal: The participant improves quality of Life and PHQ9 Scores as seen by post scores and/or verbalization of changes             Quality of Life Scores:   Quality of Life - 07/17/22 0929       Quality of Life   Select Quality of Life      Quality of Life Scores   Health/Function Post 22.64 %    Socioeconomic Post 24 %    Psych/Spiritual Post 26.71 %    Family Post 23.6 %    GLOBAL Post 23.94 %            Scores of 19 and below usually indicate a poorer quality of life in these areas.  A difference of  2-3 points is a clinically meaningful difference.  A difference of 2-3 points in the total score of the Quality of Life Index has been associated with significant improvement in overall quality of life, self-image, physical symptoms, and general health in studies assessing change in quality of life.  PHQ-9: Review Flowsheet  More data exists      07/17/2022 04/11/2022 09/11/2021 04/03/2021 03/16/2021  Depression screen PHQ 2/9  Decreased Interest  0 0 0 0 0  Down, Depressed, Hopeless 0 0 0 0 0  PHQ - 2 Score 0 0 0 0 0  Altered sleeping 0 1 0 1 -  Tired, decreased energy 2 1 1 1  -  Change in appetite 0 1 0 0 -  Feeling bad or failure about yourself  0 0 0 0 -  Trouble concentrating 0 1 0 0 -  Moving slowly or fidgety/restless 0 0 0 0 -  Suicidal thoughts 0 0 0 0 -  PHQ-9 Score 2 4 1 2  -  Difficult doing work/chores Not difficult at all Somewhat difficult Not difficult at all Not difficult at all -   Interpretation of Total Score  Total Score Depression Severity:  1-4 = Minimal depression, 5-9 = Mild depression, 10-14 = Moderate depression, 15-19 = Moderately severe depression, 20-27 = Severe depression   Psychosocial Evaluation and Intervention:  Psychosocial Evaluation - 04/08/22 0939       Psychosocial Evaluation & Interventions   Interventions Encouraged to exercise with the program and follow exercise prescription;Relaxation education;Stress management education    Comments Jacee states that he has no issues with depression or anxiets and does not take anything for his mood. He can look to his wife, son, daughter and grandaughter for support.    Expected Outcomes Short: Attend HeartTrack stress management education to decrease stress. Long: Maintain exercise Post HeartTrack to keep stress at a minimum.    Continue Psychosocial Services  Follow up required by staff             Psychosocial Re-Evaluation:  Psychosocial Re-Evaluation     Row Name 05/01/22 [phone] 05/20/22 0959 06/18/22 0954         Psychosocial Re-Evaluation   Current issues with None Identified None Identified None Identified     Comments Patient reports no issues with their current mental states, sleep, stress, depression or anxiety. Will follow up with patient in a few weeks for any changes. Kelvin reports no major  stress concerns a this time, he relies on his wife for support as well as his son who lives close. Lewin reports that he and his wife likes to  go on day trips and he enjoys staying active around the house and in his garden. He reports no issues with his sleep at this time. Tremond reports no major stress concerns a this time. He reports that his wife and his son are good support for him. He enjoys going to the lake and fishing for stress relief. He also enjoys working in the garden. He states that he is overall sleeping well, but has been waking up earlier.     Expected Outcomes Short: Continue to exercise regularly to support mental health and notify staff of any changes. Long: maintain mental health and well being through teaching of rehab or prescribed medications independently. Short: Continue to exercise regularly to support mental health and notify staff of any changes. Long: maintain mental health and well being through teaching of rehab or prescribed medications independently. Short: Continue to exercise to support mental health. Long: Continue to maintain positive outlook.     Interventions Encouraged to attend Cardiac Rehabilitation for the exercise Encouraged to attend Cardiac Rehabilitation for the exercise Encouraged to attend Cardiac Rehabilitation for the exercise     Continue Psychosocial Services  Follow up required by staff Follow up required by staff Follow up required by staff              Psychosocial Discharge (Final Psychosocial Re-Evaluation):  Psychosocial Re-Evaluation - 06/18/22 0954       Psychosocial Re-Evaluation   Current issues with None Identified    Comments Aceton reports no major stress concerns a this time. He reports that his wife and his son are good support for him. He enjoys going to the lake and fishing for stress relief. He also enjoys working in the garden. He states that he is overall sleeping well, but has been waking up earlier.    Expected Outcomes Short: Continue to exercise to support mental health. Long: Continue to maintain positive outlook.    Interventions Encouraged to attend Cardiac  Rehabilitation for the exercise    Continue Psychosocial Services  Follow up required by staff             Vocational Rehabilitation: Provide vocational rehab assistance to qualifying candidates.   Vocational Rehab Evaluation & Intervention:   Education: Education Goals: Education classes will be provided on a variety of topics geared toward better understanding of heart health and risk factor modification. Participant will state understanding/return demonstration of topics presented as noted by education test scores.  Learning Barriers/Preferences:  Learning Barriers/Preferences - 04/08/22 0935       Learning Barriers/Preferences   Learning Barriers None    Learning Preferences None             General Cardiac Education Topics:  AED/CPR: - Group verbal and written instruction with the use of models to demonstrate the basic use of the AED with the basic ABC's of resuscitation.   Anatomy and Cardiac Procedures: - Group verbal and visual presentation and models provide information about basic cardiac anatomy and function. Reviews the testing methods done to diagnose heart disease and the outcomes of the test results. Describes the treatment choices: Medical Management, Angioplasty, or Coronary Bypass Surgery for treating various heart conditions including Myocardial Infarction, Angina, Valve Disease, and Cardiac Arrhythmias.  Written material given at graduation. Flowsheet Row Cardiac Rehab from 07/11/2022 in [facility] Cardiac  and Pulmonary Rehab  Education need identified 04/11/22       Medication Safety: - Group verbal and visual instruction to review commonly prescribed medications for heart and lung disease. Reviews the medication, class of the drug, and side effects. Includes the steps to properly store meds and maintain the prescription regimen.  Written material given at graduation.   Intimacy: - Group verbal instruction through game format to discuss how heart and  lung disease can affect sexual intimacy. Written material given at graduation..   Know Your Numbers and Heart Failure: - Group verbal and visual instruction to discuss disease risk factors for cardiac and pulmonary disease and treatment options.  Reviews associated critical values for Overweight/Obesity, Hypertension, Cholesterol, and Diabetes.  Discusses basics of heart failure: signs/symptoms and treatments.  Introduces Heart Failure Zone chart for action plan for heart failure.  Written material given at graduation. Flowsheet Row Cardiac Rehab from 09/13/2021 in [facility] Cardiac and Pulmonary Rehab  Date 08/30/21  Educator SB  Instruction Review Code 1- Verbalizes Understanding       Infection Prevention: - Provides verbal and written material to individual with discussion of infection control including proper hand washing and proper equipment cleaning during exercise session. Flowsheet Row Cardiac Rehab from 07/11/2022 in [facility] Cardiac and Pulmonary Rehab  Date 04/08/22  Educator jh  Instruction Review Code 1- Verbalizes Understanding       Falls Prevention: - Provides verbal and written material to individual with discussion of falls prevention and safety. Flowsheet Row Cardiac Rehab from 07/11/2022 in [facility] Cardiac and Pulmonary Rehab  Date 04/08/22  Educator jh  Instruction Review Code 1- Verbalizes Understanding       Other: -Provides group and verbal instruction on various topics (see comments)   Knowledge Questionnaire Score:  Knowledge Questionnaire Score - 07/17/22 0924       Knowledge Questionnaire Score   Post Score 22/26             Core Components/Risk Factors/Patient Goals at Admission:  Personal Goals and Risk Factors at Admission - 04/11/22 1110       Core Components/Risk Factors/Patient Goals on Admission    Weight Management Yes;Weight Maintenance    Admit Weight 181 lb 12.8 oz (82.5 kg)    Goal Weight: Short Term 181 lb 12.8 oz (82.5 kg)    Goal  Weight: Long Term 181 lb 12.8 oz (82.5 kg)    Expected Outcomes Short Term: Continue to assess and modify interventions until short term weight is achieved;Long Term: Adherence to nutrition and physical activity/exercise program aimed toward attainment of established weight goal;Weight Maintenance: Understanding of the daily nutrition guidelines, which includes 25-35% calories from fat, 7% or less cal from saturated fats, less than 200mg  cholesterol, less than 1.5gm of sodium, & 5 or more servings of fruits and vegetables daily;Understanding recommendations for meals to include 15-35% energy as protein, 25-35% energy from fat, 35-60% energy from carbohydrates, less than 200mg  of dietary cholesterol, 20-35 gm of total fiber daily;Understanding of distribution of calorie intake throughout the day with the consumption of 4-5 meals/snacks    Heart Failure Yes    Intervention Provide a combined exercise and nutrition program that is supplemented with education, support and counseling about heart failure. Directed toward relieving symptoms such as shortness of breath, decreased exercise tolerance, and extremity edema.    Expected Outcomes Improve functional capacity of life;Short term: Attendance in program 2-3 days a week with increased exercise capacity. Reported lower sodium intake. Reported increased fruit and  vegetable intake. Reports medication compliance.;Long term: Adoption of self-care skills and reduction of barriers for early signs and symptoms recognition and intervention leading to self-care maintenance.;Short term: Daily weights obtained and reported for increase. Utilizing diuretic protocols set by physician.    Hypertension Yes    Intervention Provide education on lifestyle modifcations including regular physical activity/exercise, weight management, moderate sodium restriction and increased consumption of fresh fruit, vegetables, and low fat dairy, alcohol moderation, and smoking cessation.;Monitor  prescription use compliance.    Expected Outcomes Short Term: Continued assessment and intervention until BP is < 140/26mm HG in hypertensive participants. < 130/57mm HG in hypertensive participants with diabetes, heart failure or chronic kidney disease.;Long Term: Maintenance of blood pressure at goal levels.    Lipids Yes    Intervention Provide education and support for participant on nutrition & aerobic/resistive exercise along with prescribed medications to achieve LDL 70mg , HDL >40mg .    Expected Outcomes Short Term: Participant states understanding of desired cholesterol values and is compliant with medications prescribed. Participant is following exercise prescription and nutrition guidelines.;Long Term: Cholesterol controlled with medications as prescribed, with individualized exercise RX and with personalized nutrition plan. Value goals: LDL < 70mg , HDL > 40 mg.             Education:Diabetes - Individual verbal and written instruction to review signs/symptoms of diabetes, desired ranges of glucose level fasting, after meals and with exercise. Acknowledge that pre and post exercise glucose checks will be done for 3 sessions at entry of program.   Core Components/Risk Factors/Patient Goals Review:   Goals and Risk Factor Review     Row Name 05/01/22 0953 05/20/22 1000 06/18/22 0958         Core Components/Risk Factors/Patient Goals Review   Personal Goals Review Weight Management/Obesity;Hypertension Weight Management/Obesity;Hypertension Weight Management/Obesity;Hypertension     Review Reese is doing well with his exercise. He is turning 89 years old this week. His blood pressure and weight is where it needs to be and he wants to maintain his weight. Erza reports doing well in rehab. He takes his BP at home 2-3x/week running 120s/60s. He checks his weight regularly and is staying around 180lbs now. Triston states that he is comfortable with where his weight is at this time. He weighs  in the mornings and states that his weight has been consistent around 178 lb. He also checks his BP every morning and states that it usually runs in the 120s/60s.     Expected Outcomes Short: maintain weight and blood pressure readings. Long: maintain weight and blood pressure readings independently. Short: continue checking BP and weight at home Long: continue to manage risk factors independently Short: Continue checking BP and weight at home. Long: Continue to manage lifestyle risk factors.              Core Components/Risk Factors/Patient Goals at Discharge (Final Review):   Goals and Risk Factor Review - 06/18/22 0958       Core Components/Risk Factors/Patient Goals Review   Personal Goals Review Weight Management/Obesity;Hypertension    Review Jamario states that he is comfortable with where his weight is at this time. He weighs in the mornings and states that his weight has been consistent around 178 lb. He also checks his BP every morning and states that it usually runs in the 120s/60s.    Expected Outcomes Short: Continue checking BP and weight at home. Long: Continue to manage lifestyle risk factors.  ITP Comments:  ITP Comments     Row Name 04/08/22 [phone] 04/11/22 1052 04/15/22 0928 05/08/22 0743 06/05/22 0752   ITP Comments In person Visit completed. Patient informed on EP and RD appointment and 6 Minute walk test. Patient also informed of patient health questionnaires on My Chart. Patient Verbalizes understanding. Visit diagnosis can be found in [facility] 03/25/2022. Completed and gym orientation. Initial ITP created and sent for review to Dr. Bethann Punches, Medical Director. First full day of exercise!  Patient was oriented to gym and equipment including functions, settings, policies, and procedures.  Patient's individual exercise prescription and treatment plan were reviewed.  All starting workloads were established based on the results of the 6 minute walk test done at  initial orientation visit.  The plan for exercise progression was also introduced and progression will be customized based on patient's performance and goals. 30 day review completed. ITP sent to Dr. Bethann Punches, Medical Director of Cardiac Rehab. Continue with ITP unless changes are made by physician. 30 Day review completed. Medical Director ITP review done, changes made as directed, and signed approval by Medical Director.    Row Name 07/03/22 0709           ITP Comments 30 Day review completed. Medical Director ITP review done, changes made as directed, and signed approval by Medical Director.                Comments: Discharge ITP

## 2022-07-19 NOTE — Progress Notes (Signed)
Daily Session Note  Patient Details  Name: Shawn Andrews MRN: [id number] Date of Birth: 13-Jul-1928 Referring Provider:   Flowsheet Row Cardiac Rehab from 04/11/2022 in [facility] Cardiac [facility] Pul[facility] Re[facility]  Referring Provider Dr. Dorothyann Peng, MD       Encounter Date: 07/19/2022  Check In:  Session Check In - 07/19/22 4098       Check-In   Supervising physician immediately available to respond to emergencies See telemetry face sheet for immediately available ER MD    Location [facility]-Cardiac & Pulmonary Rehab    Staff Present Darcel Bayley, RN,BC,MSN;Joseph Noreene Filbert [place], [place], ACSM CEP, Exercise Physiologist    Virtual Visit No    Medication changes reported     No    Fall or balance concerns reported    No    Tobacco Cessation No Change    Warm-up and Cool-down Performed on first and last piece of equipment    Resistance Training Performed Yes    VAD Patient? No    PAD/SET Patient? No      Pain Assessment   Currently in Pain? No/denies                Social History   Tobacco Use  Smoking Status Former   Packs/day: 0.00   Years: 0.00   Additional pack years: 0.00   Total pack years: 0.00   Types: Pipe, Cigarettes   Quit date: 12/26/2000   Years since quitting: 21.5   Passive exposure: Past  Smokeless Tobacco Current   Types: Chew  Tobacco Comments   quit over 30 years ago    Goals Met:  Independence with exercise equipment Exercise tolerated well No report of concerns or symptoms today Strength training completed today  Goals Unmet:  Not Applicable  Comments: Pt able to follow exercise prescription today without complaint.  Will continue to monitor for progression.    Dr. Bethann Punches is Medical Director for [facility] Cardiac Rehabilitation.  Dr. Vida Rigger is Medical Director for [facility] Pulmonary Rehabilitation.

## 2022-07-19 NOTE — Progress Notes (Signed)
Discharge Progress Report  Patient Details  Name: Shawn Andrews MRN: [id number] Date of Birth: May 24, 1928 Referring Provider:   Flowsheet Row Cardiac Rehab from 04/11/2022 in [facility] Cardiac and Pulmonary Rehab  Referring Provider Dr. Dorothyann Peng, MD        Number of Visits: 36/36  Reason for Discharge:  Patient reached a stable level of exercise. Patient independent in their exercise. Patient has met program and personal goals.  Smoking History:  Social History   Tobacco Use  Smoking Status Former   Packs/day: 0.00   Years: 0.00   Additional pack years: 0.00   Total pack years: 0.00   Types: Pipe, Cigarettes   Quit date: 12/26/2000   Years since quitting: 21.5   Passive exposure: Past  Smokeless Tobacco Current   Types: Chew  Tobacco Comments   quit over 30 years ago    Diagnosis:  NSTEMI (non-ST elevated myocardial infarction) (HCC)  Status post coronary artery stent placement  ADL [facility]:   Initial Exercise Prescription:  Initial Exercise Prescription - 04/11/22 1300       Date of Initial Exercise RX and Referring Provider   Date 04/11/22    Referring Provider Dr. Dorothyann Peng, MD      Oxygen   Maintain Oxygen Saturation 88% or higher      Recumbant Bike   Level 2    Watts 15    Minutes 15    METs 1.2      NuStep   Level 2    Minutes 15    METs 1.2      Track   Laps 20    Minutes 15    METs 2.09      Prescription Details   Frequency (times per week) 2    Duration Progress to 30 minutes of continuous aerobic without signs/symptoms of physical distress      Intensity   THRR 40-80% of Max Heartrate 85-115    Ratings of Perceived Exertion 11-13    Perceived Dyspnea 0-4      Progression   Progression Continue to progress workloads to maintain intensity without signs/symptoms of physical distress.      Resistance Training   Training Prescription Yes    Weight 4 lb    Reps 10-15             Discharge Exercise Prescription  (Final Exercise Prescription Changes):  Exercise Prescription Changes - 07/15/22 1400       Response to Exercise   Blood Pressure (Admit) 110/58    Blood Pressure (Exit) 112/62    Heart Rate (Admit) 77 bpm    Heart Rate (Exercise) 84 bpm    Heart Rate (Exit) 62 bpm    Rating of Perceived Exertion (Exercise) 13    Symptoms none    Duration Continue with 30 min of aerobic exercise without signs/symptoms of physical distress.    Intensity THRR unchanged      Progression   Progression Continue to progress workloads to maintain intensity without signs/symptoms of physical distress.    Average METs 2.41      Resistance Training   Training Prescription Yes    Weight 4 lb    Reps 10-15      Interval Training   Interval Training No      Recumbant Bike   Level 3    Minutes 15    METs 2.95      NuStep   Level 6    Minutes 15  Biostep-RELP   Level 3    Minutes 15    METs 2      Track   Laps 24    Minutes 15    METs 2.31      Home Exercise Plan   Plans to continue exercise at Home (comment)   walking   Frequency Add 3 additional days to program exercise sessions.    Initial Home Exercises Provided 05/01/22      Oxygen   Maintain Oxygen Saturation 88% or higher             Functional Capacity:  6 Minute Walk     Row Name 04/11/22 1327 07/15/22 0939       6 Minute Walk   Phase Initial Initial    Distance 910 feet 1100 feet    Distance % Change -- 21 %    Distance Feet Change -- 190 ft    Walk Time 6 minutes 6 minutes    # of Rest Breaks 0 0    MPH 1.72 2.08    METS 1.2 1.42    RPE 11 11    Perceived Dyspnea  1 0    VO2 Peak 4.19 4.78    Symptoms Yes (comment) Yes (comment)    Comments hip pain usual hip pain 3/10    Resting HR 57 bpm 54 bpm    Resting BP 138/60 122/76    Resting Oxygen Saturation  97 % 95 %    Exercise Oxygen Saturation  during 6 min walk 96 % 92 %    Max Ex. HR 82 bpm 87 bpm    Max Ex. BP 152/54 132/62    2 Minute Post BP  142/52 120/60             Psychological, QOL, Others - Outcomes: PHQ 2/9:    07/17/2022    9:25 AM 04/11/2022   10:53 AM 09/11/2021   10:17 AM 04/03/2021    9:31 AM 03/16/2021   10:05 AM  Depression screen PHQ 2/9  Decreased Interest 0 0 0 0 0  Down, Depressed, Hopeless 0 0 0 0 0  PHQ - 2 Score 0 0 0 0 0  Altered sleeping 0 1 0 1   Tired, decreased energy 2 1 1 1    Change in appetite 0 1 0 0   Feeling bad or failure about yourself  0 0 0 0   Trouble concentrating 0 1 0 0   Moving slowly or fidgety/restless 0 0 0 0   Suicidal thoughts 0 0 0 0   PHQ-9 Score 2 4 1 2    Difficult doing work/chores Not difficult at all Somewhat difficult Not difficult at all Not difficult at all     Quality of Life:  Quality of Life - 07/17/22 0929       Quality of Life   Select Quality of Life      Quality of Life Scores   Health/Function Post 22.64 %    Socioeconomic Post 24 %    Psych/Spiritual Post 26.71 %    Family Post 23.6 %    GLOBAL Post 23.94 %             Personal Goals: Goals established at orientation with interventions provided to work toward goal.  Personal Goals and Risk Factors at Admission - 04/11/22 1110       Core Components/Risk Factors/Patient Goals on Admission    Weight Management Yes;Weight Maintenance    Admit Weight 181  lb 12.8 oz (82.5 kg)    Goal Weight: Short Term 181 lb 12.8 oz (82.5 kg)    Goal Weight: Long Term 181 lb 12.8 oz (82.5 kg)    Expected Outcomes Short Term: Continue to assess and modify interventions until short term weight is achieved;Long Term: Adherence to nutrition and physical activity/exercise program aimed toward attainment of established weight goal;Weight Maintenance: Understanding of the daily nutrition guidelines, which includes 25-35% calories from fat, 7% or less cal from saturated fats, less than 200mg  cholesterol, less than 1.5gm of sodium, & 5 or more servings of fruits and vegetables daily;Understanding recommendations for  meals to include 15-35% energy as protein, 25-35% energy from fat, 35-60% energy from carbohydrates, less than 200mg  of dietary cholesterol, 20-35 gm of total fiber daily;Understanding of distribution of calorie intake throughout the day with the consumption of 4-5 meals/snacks    Heart Failure Yes    Intervention Provide a combined exercise and nutrition program that is supplemented with education, support and counseling about heart failure. Directed toward relieving symptoms such as shortness of breath, decreased exercise tolerance, and extremity edema.    Expected Outcomes Improve functional capacity of life;Short term: Attendance in program 2-3 days a week with increased exercise capacity. Reported lower sodium intake. Reported increased fruit and vegetable intake. Reports medication compliance.;Long term: Adoption of self-care skills and reduction of barriers for early signs and symptoms recognition and intervention leading to self-care maintenance.;Short term: Daily weights obtained and reported for increase. Utilizing diuretic protocols set by physician.    Hypertension Yes    Intervention Provide education on lifestyle modifcations including regular physical activity/exercise, weight management, moderate sodium restriction and increased consumption of fresh fruit, vegetables, and low fat dairy, alcohol moderation, and smoking cessation.;Monitor prescription use compliance.    Expected Outcomes Short Term: Continued assessment and intervention until BP is < 140/54mm HG in hypertensive participants. < 130/59mm HG in hypertensive participants with diabetes, heart failure or chronic kidney disease.;Long Term: Maintenance of blood pressure at goal levels.    Lipids Yes    Intervention Provide education and support for participant on nutrition & aerobic/resistive exercise along with prescribed medications to achieve LDL 70mg , HDL >40mg .    Expected Outcomes Short Term: Participant states understanding of  desired cholesterol values and is compliant with medications prescribed. Participant is following exercise prescription and nutrition guidelines.;Long Term: Cholesterol controlled with medications as prescribed, with individualized exercise RX and with personalized nutrition plan. Value goals: LDL < 70mg , HDL > 40 mg.              Personal Goals Discharge:  Goals and Risk Factor Review     Row Name 05/01/22 0953 05/20/22 1000 06/18/22 0958         Core Components/Risk Factors/Patient Goals Review   Personal Goals Review Weight Management/Obesity;Hypertension Weight Management/Obesity;Hypertension Weight Management/Obesity;Hypertension     Review Yisroel is doing well with his exercise. He is turning 89 years old this week. His blood pressure and weight is where it needs to be and he wants to maintain his weight. Raequon reports doing well in rehab. He takes his BP at home 2-3x/week running 120s/60s. He checks his weight regularly and is staying around 180lbs now. Kaikoa states that he is comfortable with where his weight is at this time. He weighs in the mornings and states that his weight has been consistent around 178 lb. He also checks his BP every morning and states that it usually runs in the 120s/60s.  Expected Outcomes Short: maintain weight and blood pressure readings. Long: maintain weight and blood pressure readings independently. Short: continue checking BP and weight at home Long: continue to manage risk factors independently Short: Continue checking BP and weight at home. Long: Continue to manage lifestyle risk factors.              Exercise Goals and Review:  Exercise Goals     Row Name 04/11/22 1334             Exercise Goals   Increase Physical Activity Yes       Intervention Provide advice, education, support and counseling about physical activity/exercise needs.;Develop an individualized exercise prescription for aerobic and resistive training based on initial  evaluation findings, risk stratification, comorbidities and participant's personal goals.       Expected Outcomes Short Term: Attend rehab on a regular basis to increase amount of physical activity.;Long Term: Add in home exercise to make exercise part of routine and to increase amount of physical activity.;Long Term: Exercising regularly at least 3-5 days a week.       Increase Strength and Stamina Yes       Intervention Provide advice, education, support and counseling about physical activity/exercise needs.;Develop an individualized exercise prescription for aerobic and resistive training based on initial evaluation findings, risk stratification, comorbidities and participant's personal goals.       Expected Outcomes Short Term: Increase workloads from initial exercise prescription for resistance, speed, and METs.;Short Term: Perform resistance training exercises routinely during rehab and add in resistance training at home;Long Term: Improve cardiorespiratory fitness, muscular endurance and strength as measured by increased METs and functional capacity ( )       Able to understand and use rate of perceived exertion (RPE) scale Yes       Intervention Provide education and explanation on how to use RPE scale       Expected Outcomes Short Term: Able to use RPE daily in rehab to express subjective intensity level;Long Term:  Able to use RPE to guide intensity level when exercising independently       Able to understand and use Dyspnea scale Yes       Intervention Provide education and explanation on how to use Dyspnea scale       Expected Outcomes Short Term: Able to use Dyspnea scale daily in rehab to express subjective sense of shortness of breath during exertion;Long Term: Able to use Dyspnea scale to guide intensity level when exercising independently       Knowledge and understanding of Target Heart Rate Range (THRR) Yes       Intervention Provide education and explanation of THRR including how  the numbers were predicted and where they are located for reference       Expected Outcomes Short Term: Able to use daily as guideline for intensity in rehab;Short Term: Able to state/look up THRR;Long Term: Able to use THRR to govern intensity when exercising independently       Able to check pulse independently Yes       Intervention Provide education and demonstration on how to check pulse in carotid and radial arteries.;Review the importance of being able to check your own pulse for safety during independent exercise       Expected Outcomes Short Term: Able to explain why pulse checking is important during independent exercise;Long Term: Able to check pulse independently and accurately       Understanding of Exercise Prescription Yes  Intervention Provide education, explanation, and written materials on patient's individual exercise prescription       Expected Outcomes Short Term: Able to explain program exercise prescription;Long Term: Able to explain home exercise prescription to exercise independently                Exercise Goals Re-Evaluation:  Exercise Goals Re-Evaluation     Row Name 04/15/22 1610 04/22/22 1432 05/01/22 1011 05/06/22 1156 05/20/22 1002     Exercise Goal Re-Evaluation   Exercise Goals Review Able to understand and use rate of perceived exertion (RPE) scale;Able to understand and use Dyspnea scale;Knowledge and understanding of Target Heart Rate Range (THRR);Understanding of Exercise Prescription Increase Physical Activity;Increase Strength and Stamina;Understanding of Exercise Prescription Increase Physical Activity;Increase Strength and Stamina;Understanding of Exercise Prescription;Able to understand and use rate of perceived exertion (RPE) scale;Able to understand and use Dyspnea scale;Knowledge and understanding of Target Heart Rate Range (THRR);Able to check pulse independently Increase Physical Activity;Increase Strength and Stamina;Understanding of Exercise  Prescription Increase Physical Activity;Increase Strength and Stamina;Understanding of Exercise Prescription   Comments Reviewed RPE scale, THR and program prescription with pt today.  Pt voiced understanding and was given a copy of goals to take home. Jaidynn is off to a good start in the program. During his first two sessions of rehab he was able to work at level 5 on the T4 nustep and level 2 on the recumbent bike. He also was able to walk 13 laps on the track in 10 minutes. We will continue to monitor his progress in the program. Reviewed home exercise with pt today.  Pt plans to walk at home for exercise.  Reviewed THR, pulse, RPE, sign and symptoms, pulse oximetery and when to call 911 or MD.  Also discussed weather considerations and indoor options.  Pt voiced understanding. Narender continues to do well in rehab. He increased to 25 laps on the track which is his highest yet. He also incresed to level 3 on the recumbent bike, we hope to see his watts improve over time. He is doing well at level 5 on the T4 Nustep and  reaches his THR some sessions. Will continue to monitor. Caelen reports walking at home for exercise and stays active around the house as well as in his garden.   Expected Outcomes Short: Use RPE daily to regulate intensity. Long: Follow program prescription in THR. Short: Continue to follow current exercise prescription. Long: Continue to improve strength and stamina. Short: Start to walk again at home Long: Continue to exercise independently Short: Increase watts on recumbent bike Long: Continue to increase overall MET level and stamina Short: continue to walk at home Long: Continue to exercise independently    Row Name 05/20/22 1447 06/03/22 1019 06/18/22 0742 06/18/22 0948 07/01/22 1525     Exercise Goal Re-Evaluation   Exercise Goals Review Increase Physical Activity;Increase Strength and Stamina;Understanding of Exercise Prescription Increase Physical Activity;Increase Strength and  Stamina;Understanding of Exercise Prescription Increase Physical Activity;Increase Strength and Stamina;Understanding of Exercise Prescription Increase Physical Activity;Increase Strength and Stamina;Understanding of Exercise Prescription Increase Physical Activity;Increase Strength and Stamina;Understanding of Exercise Prescription   Comments Jarett is doing well in rehab. He has continued to consistently walk above 20 laps on the track. He also has continued to work at level 3 on the T5 nustep and level 5 on the T4 nustep. He has done well with 4 lb hand weights for resistance training as well. We will continue to monitor his progress. Medrick continues to do  well in rehab. He ranges walking between 20-25 laps on the track, our goal is for him to reach up to 30 overall. He did increase to level 4 on the recumbent bike and tried out the XR for the first time at level 1. He could benefit from increasing his level on the XR as he appears to be at level 4 on other seated machines, such as both the T4 and Biostep . He does reach his THR most sessions. We will continue to monitor. Honorio is doing well in rehab. He continues to walk up to 20 laps on the track. He also continues to work at level 4 on the biostep, T5 nustep, and T4 nustep. He has continued to use 4 lb hand weights for resistance training as well. We will continue to monitor his progress in the program. Edwing reports doing well with exercise. He states that he has been increasing his workloads in the program. He also states that he has improved his strength and stamina. He also reports that his left hip pain has limited him with exercise. He states that he has been staying active and does not do a lot of sitting around. He also walks at home three days a week. We will continue to monitor his progress. Javyon continues to do well in rehab.  He is up to 24 laps this week, usually around 13-15 laps.  He is also on level 3 on the bike.  We will continue to monitor his  progress.   Expected Outcomes Short: Continue to push for more laps on the track. Long: Continue to improve strength and stamina. Short: Increase level on XR to 2, strive to hit 30 laps on track Long: Continue to increase overall MET level and stamina Short: Continue to push for more laps on the track. Long: Continue to improve strength and stamina. Short: Continue to walk at home on days away from rehab. Long: Continue to improve strength and stamina. Short: Continue to walk more Long: continue to improve stamina    Row Name 07/15/22 1455             Exercise Goal Re-Evaluation   Exercise Goals Review Increase Physical Activity;Increase Strength and Stamina;Understanding of Exercise Prescription       Comments Amon is doing well in rehab and is close to graduating. He recently completed his post and improved by 21%! He also has continued to walk up to 24 laps on the track and improved to level 6 on the T4 nustep. We will continue to monitor his progress until he graduates from the program.       Expected Outcomes Short: Graduate. Long: Continue to exercise independently.                Nutrition & Weight - Outcomes:  Pre Biometrics - 07/15/22 0944       Pre Biometrics   Height 5' 10.5" (1.791 m)    Weight 185 lb 1.6 oz (84 kg)    Waist Circumference 40 inches    Hip Circumference 41 inches    Waist to Hip Ratio 0.98 %    BMI (Calculated) 26.17    Single Leg Stand 11.16 seconds              Nutrition:  Nutrition Therapy & Goals - 04/11/22 1108       Intervention Plan   Intervention Prescribe, educate and counsel regarding individualized specific dietary modifications aiming towards targeted core components such as weight, hypertension,  lipid management, diabetes, heart failure and other comorbidities.    Expected Outcomes Short Term Goal: Understand basic principles of dietary content, such as calories, fat, sodium, cholesterol and nutrients.;Short Term Goal: A plan  has been developed with personal nutrition goals set during dietitian appointment.;Long Term Goal: Adherence to prescribed nutrition plan.             Nutrition Discharge:   Education Questionnaire Score:  Knowledge Questionnaire Score - 07/17/22 0924       Knowledge Questionnaire Score   Post Score 22/26             Goals reviewed with patient; copy given to patient.

## 2022-07-22 ENCOUNTER — Encounter: Payer: Medicare Other | Admitting: *Deleted

## 2022-07-30 ENCOUNTER — Encounter: Payer: Self-pay | Admitting: *Deleted

## 2022-08-08 ENCOUNTER — Emergency Department: Payer: Medicare Other

## 2022-08-08 ENCOUNTER — Emergency Department
Admission: EM | Admit: 2022-08-08 | Discharge: 2022-08-08 | Disposition: A | Discharge status: Home / Self Care | Payer: Medicare Other | Attending: Emergency Medicine | Admitting: Emergency Medicine

## 2022-08-08 ENCOUNTER — Other Ambulatory Visit: Payer: Self-pay

## 2022-08-08 DIAGNOSIS — K5790 Diverticulosis of intestine, part unspecified, without perforation or abscess without bleeding: Secondary | ICD-10-CM

## 2022-08-08 DIAGNOSIS — R1033 Periumbilical pain: Secondary | ICD-10-CM | POA: Diagnosis present

## 2022-08-08 DIAGNOSIS — K5730 Diverticulosis of large intestine without perforation or abscess without bleeding: Secondary | ICD-10-CM | POA: Diagnosis not present

## 2022-08-08 DIAGNOSIS — N189 Chronic kidney disease, unspecified: Secondary | ICD-10-CM | POA: Insufficient documentation

## 2022-08-08 DIAGNOSIS — I509 Heart failure, unspecified: Secondary | ICD-10-CM | POA: Diagnosis not present

## 2022-08-08 DIAGNOSIS — R1031 Right lower quadrant pain: Secondary | ICD-10-CM

## 2022-08-08 LAB — COMPREHENSIVE METABOLIC PANEL
ALT: 31 U/L (ref 0–44)
AST: 68 U/L — ABNORMAL HIGH (ref 15–41)
Albumin: 3.9 g/dL (ref 3.5–5.0)
Alkaline Phosphatase: 72 U/L (ref 38–126)
Anion gap: 8 (ref 5–15)
BUN: 33 mg/dL — ABNORMAL HIGH (ref 8–23)
CO2: 20 mmol/L — ABNORMAL LOW (ref 22–32)
Calcium: 8.4 mg/dL — ABNORMAL LOW (ref 8.9–10.3)
Chloride: 103 mmol/L (ref 98–111)
Creatinine, Ser: 1.6 mg/dL — ABNORMAL HIGH (ref 0.61–1.24)
GFR, Estimated: 40 mL/min — ABNORMAL LOW (ref >60)
Glucose, Bld: 137 mg/dL — ABNORMAL HIGH (ref 70–99)
Potassium: 4.6 mmol/L (ref 3.5–5.1)
Sodium: 131 mmol/L — ABNORMAL LOW (ref 135–145)
Total Bilirubin: 0.4 mg/dL (ref 0.3–1.2)
Total Protein: 6.1 g/dL — ABNORMAL LOW (ref 6.5–8.1)

## 2022-08-08 LAB — CBC
HCT: 28.9 % — ABNORMAL LOW (ref 39.0–52.0)
Hemoglobin: 9.9 g/dL — ABNORMAL LOW (ref 13.0–17.0)
MCH: 30.2 pg (ref 26.0–34.0)
MCHC: 34.3 g/dL (ref 30.0–36.0)
MCV: 88.1 fL (ref 80.0–100.0)
Platelets: 147 10*3/uL — ABNORMAL LOW (ref 150–400)
RBC: 3.28 MIL/uL — ABNORMAL LOW (ref 4.22–5.81)
RDW: 13.1 % (ref 11.5–15.5)
WBC: 6 10*3/uL (ref 4.0–10.5)
nRBC: 0 % (ref 0.0–0.2)

## 2022-08-08 LAB — TROPONIN I (HIGH SENSITIVITY)
Troponin I (High Sensitivity): 16 ng/L (ref <18)
Troponin I (High Sensitivity): 17 ng/L (ref <18)

## 2022-08-08 LAB — BRAIN NATRIURETIC PEPTIDE: B Natriuretic Peptide: 422.1 pg/mL — ABNORMAL HIGH (ref 0.0–100.0)

## 2022-08-08 LAB — LIPASE, BLOOD: Lipase: 38 U/L (ref 11–51)

## 2022-08-08 MED ORDER — BISACODYL 5 MG PO TBEC
10.0000 mg | DELAYED_RELEASE_TABLET | Freq: Once | ORAL | Status: AC
  Administered 2022-08-08: 10 mg via ORAL
  Filled 2022-08-08: qty 2

## 2022-08-08 MED ORDER — IOHEXOL 300 MG/ML  SOLN
80.0000 mL | Freq: Once | INTRAMUSCULAR | Status: AC | PRN
  Administered 2022-08-08: 80 mL via INTRAVENOUS

## 2022-08-08 NOTE — ED Triage Notes (Signed)
Pt to ED for upper abd pain started 30 mins ago. Denies n/v/d. Reports took 1 nitroglycerin Pta d/t thinking it was heart related with no relief.

## 2022-08-08 NOTE — ED Provider Notes (Signed)
[facility] Provider Note   Event Date/Time   First MD Initiated Contact with Patient 08/08/22 [phone]     (approximate) History  Abdominal Pain  HPI Shawn Andrews is a 89 y.o. male with a past medical history of of heart failure, ACS, anemia,'s CKD who presents complaining of periumbilical and right lower quadrant abdominal pain that began this morning and has been stable since onset at 9/10 with no radiation.  Patient denies any exacerbating or relieving factors for this pain.  Patient denies any pain similar to this in the past.  Patient does complain of constipation and took a suppository this morning with no relief. ROS: Patient currently denies any vision changes, tinnitus, difficulty speaking, facial droop, sore throat, chest pain, shortness of breath, nausea/vomiting/diarrhea, dysuria, or weakness/numbness/paresthesias in any extremity   Physical Exam  Triage Vital Signs: ED Triage Vitals  Encounter Vitals Group     BP 08/08/22 0928 (!) 143/61     Systolic BP Percentile --      Diastolic BP Percentile --      Pulse Rate 08/08/22 0928 (!) 51     Resp 08/08/22 0928 20     Temp 08/08/22 0931 97.7 F (36.5 C)     Temp src --      SpO2 08/08/22 0928 97 %     Weight 08/08/22 0929 179 lb (81.2 kg)     Height 08/08/22 0929 5\' 10"  (1.778 m)     Head Circumference --      Peak Flow --      Pain Score 08/08/22 0929 9     Pain Loc --      Pain Education --      Exclude from Growth Chart --    Most recent vital signs: Vitals:   08/08/22 1245 08/08/22 1300  BP:  (!) 146/60  Pulse: (!) 57 65  Resp: 17 (!) 22  Temp:    SpO2: 99% 97%   General: Awake, oriented x4. CV:  Good peripheral perfusion.  Resp:  Normal effort.  Abd:  No distention.  Other:  Elderly well-developed well-nourished Caucasian male laying in bed in no acute distress ED Results / Procedures / Treatments  Labs (all labs ordered are listed, but only abnormal results are displayed) Labs  Reviewed  CBC - Abnormal; Notable for the following components:      Result Value   RBC 3.28 (*)    Hemoglobin 9.9 (*)    HCT 28.9 (*)    Platelets 147 (*)    All other components within normal limits  COMPREHENSIVE METABOLIC PANEL - Abnormal; Notable for the following components:   Sodium 131 (*)    CO2 20 (*)    Glucose, Bld 137 (*)    BUN 33 (*)    Creatinine, Ser 1.60 (*)    Calcium 8.4 (*)    Total Protein 6.1 (*)    AST 68 (*)    GFR, Estimated 40 (*)    All other components within normal limits  BRAIN NATRIURETIC PEPTIDE - Abnormal; Notable for the following components:   B Natriuretic Peptide 422.1 (*)    All other components within normal limits  LIPASE, BLOOD  TROPONIN I (HIGH SENSITIVITY)  TROPONIN I (HIGH SENSITIVITY)   EKG ED ECG REPORT I, Merwyn Katos, the attending physician, personally viewed and interpreted this ECG. Date: 08/08/2022 EKG Time: 0938 Rate: 50 Rhythm: normal sinus rhythm QRS Axis: normal Intervals: normal ST/T Wave abnormalities: normal  Narrative Interpretation: no evidence of acute ischemia RADIOLOGY ED MD interpretation: CT of the abdomen pelvis with IV contrast interpreted independently by me and shows no acute findings in the abdomen or pelvis  X-ray abdominal series interpreted independently by me and shows no evidence of acute abnormalities -Agree with radiology assessment Official radiology report(s): CT ABDOMEN PELVIS W CONTRAST  Result Date: 08/08/2022 CLINICAL DATA:  Right lower quadrant pain EXAM: CT ABDOMEN AND PELVIS WITH CONTRAST TECHNIQUE: Multidetector CT imaging of the abdomen and pelvis was performed using the standard protocol following bolus administration of intravenous contrast. RADIATION DOSE REDUCTION: This exam was performed according to the departmental dose-optimization program which includes automated exposure control, adjustment of the mA and/or kV according to patient size and/or use of iterative  reconstruction technique. CONTRAST:  80mL OMNIPAQUE IOHEXOL 300 MG/ML  SOLN COMPARISON:  CTA chest/abdomen/pelvis 03/25/2022 FINDINGS: Lower chest: The lung bases are clear. There are dense coronary artery calcifications and mild aortic valve calcifications. The heart is enlarged. There is no pericardial effusion. Hepatobiliary: The liver is unremarkable. The gallbladder is distended but without evidence of acute cholecystitis. There is no biliary ductal dilatation. Pancreas: Unremarkable. Spleen: Unremarkable. Adrenals/Urinary Tract: Multiple bilateral renal cysts are noted requiring no specific imaging follow-up. There are no suspicious renal lesions there are no stones in either kidney or along the course of either ureter. The bladder is decompressed via suprapubic catheter. Stomach/Bowel: The stomach is unremarkable. There is no evidence of bowel obstruction. There is no abnormal bowel wall thickening or inflammatory change. There is extensive colonic diverticulosis without evidence of acute diverticulitis. The appendix is normal (5-41). Vascular/Lymphatic: There is calcified plaque throughout the nonaneurysmal abdominal aorta. The major branch vessels are patent. The main portal and splenic veins are patent. There is no abdominal or pelvic lymphadenopathy. Reproductive: The prostate is enlarged, unchanged. Other: There is no ascites or free air. There is a small fat containing umbilical hernia. Musculoskeletal: There is no acute osseous abnormality or suspicious osseous lesion. Bilateral L5-S1 pars defects with associated grade 1 anterolisthesis are unchanged. IMPRESSION: 1. No acute findings in the abdomen or pelvis. Normal appendix. 2. Extensive colonic diverticulosis without evidence of acute diverticulitis. Electronically Signed   By: Lesia Hausen M.D.   On: 08/08/2022 12:13   DG ABD ACUTE 2+V W 1V CHEST  Result Date: 08/08/2022 CLINICAL DATA:  Lower abdominal pain. EXAM: DG ABDOMEN ACUTE WITH 1 VIEW  CHEST COMPARISON:  Chest x-ray 03/25/2022 FINDINGS: Similar interstitial opacity at the left base, likely scarring. No edema or focal airspace consolidation. No substantial pleural effusion. Upright film shows no evidence for intraperitoneal free air. Prominent air-fluid level noted in the stomach. No gaseous small bowel dilatation to suggest obstruction. Air in stool are seen scattered along the course of a nondilated colon. Bones are demineralized with degenerative changes in the lumbar spine. IMPRESSION: 1. No acute cardiopulmonary findings findings. 2. No evidence for intraperitoneal free air. 3. Nonobstructive bowel gas pattern. Electronically Signed   By: Kennith Center M.D.   On: 08/08/2022 10:16   PROCEDURES: Critical Care performed: No .1-3 Lead EKG Interpretation  Performed by: Merwyn Katos, MD Authorized by: Merwyn Katos, MD     Interpretation: normal     ECG rate:  68   ECG rate assessment: normal     Rhythm: sinus rhythm     Ectopy: none     Conduction: normal    MEDICATIONS ORDERED IN ED: Medications  iohexol (OMNIPAQUE) 300 MG/ML solution 80 mL (  80 mLs Intravenous Contrast Given 08/08/22 1152)  bisacodyl (DULCOLAX) EC tablet 10 mg (10 mg Oral Given 08/08/22 1256)   IMPRESSION / MDM / ASSESSMENT AND PLAN / ED COURSE  I reviewed the triage vital signs and the nursing notes.                             The patient is on the cardiac monitor to evaluate for evidence of arrhythmia and/or significant heart rate changes. Patient's presentation is most consistent with acute presentation with potential threat to life or bodily function. Patient presents for abdominal pain.  Differential diagnosis includes appendicitis, abdominal aortic aneurysm, surgical biliary disease, pancreatitis, SBO, mesenteric ischemia, serious intra-abdominal bacterial illness, genital torsion. Doubt atypical ACS. Based on history, physical exam, radiologic/laboratory evaluation, there is no red flag  results or symptomatology requiring emergent intervention or need for admission at this time Pt tolerating PO. Disposition: Patient will be discharged with strict return precautions and follow up with primary MD within 12-24 hours for further evaluation. Patient understands that this still may have an early presentation of an emergent medical condition such as appendicitis that will require a recheck.   FINAL CLINICAL IMPRESSION(S) / ED DIAGNOSES   Final diagnoses:  Right lower quadrant abdominal pain  Diverticulosis   Rx / DC Orders   ED Discharge Orders     None      Note:  This document was prepared using Dragon voice recognition software and may include unintentional dictation errors.   Merwyn Katos, MD 08/08/22 [phone]

## 2022-08-08 NOTE — Discharge Instructions (Addendum)
Please use a stool softener daily Please use tylenol (up to 400mg /day) for any continued pain

## 2022-08-09 ENCOUNTER — Emergency Department
Admission: EM | Admit: 2022-08-09 | Discharge: 2022-08-09 | Disposition: A | Discharge status: Home / Self Care | Payer: Medicare Other | Attending: Emergency Medicine | Admitting: Emergency Medicine

## 2022-08-09 ENCOUNTER — Encounter: Payer: Self-pay | Admitting: Emergency Medicine

## 2022-08-09 ENCOUNTER — Emergency Department: Payer: Medicare Other

## 2022-08-09 ENCOUNTER — Telehealth: Payer: Self-pay | Admitting: Urology

## 2022-08-09 ENCOUNTER — Other Ambulatory Visit: Payer: Self-pay

## 2022-08-09 DIAGNOSIS — I130 Hypertensive heart and chronic kidney disease with heart failure and stage 1 through stage 4 chronic kidney disease, or unspecified chronic kidney disease: Secondary | ICD-10-CM | POA: Insufficient documentation

## 2022-08-09 DIAGNOSIS — S60222A Contusion of left hand, initial encounter: Secondary | ICD-10-CM | POA: Insufficient documentation

## 2022-08-09 DIAGNOSIS — D72829 Elevated white blood cell count, unspecified: Secondary | ICD-10-CM | POA: Insufficient documentation

## 2022-08-09 DIAGNOSIS — S0081XA Abrasion of other part of head, initial encounter: Secondary | ICD-10-CM | POA: Diagnosis not present

## 2022-08-09 DIAGNOSIS — S0990XA Unspecified injury of head, initial encounter: Secondary | ICD-10-CM | POA: Insufficient documentation

## 2022-08-09 DIAGNOSIS — E871 Hypo-osmolality and hyponatremia: Secondary | ICD-10-CM | POA: Insufficient documentation

## 2022-08-09 DIAGNOSIS — N400 Enlarged prostate without lower urinary tract symptoms: Secondary | ICD-10-CM | POA: Diagnosis not present

## 2022-08-09 DIAGNOSIS — W010XXA Fall on same level from slipping, tripping and stumbling without subsequent striking against object, initial encounter: Secondary | ICD-10-CM | POA: Insufficient documentation

## 2022-08-09 DIAGNOSIS — D649 Anemia, unspecified: Secondary | ICD-10-CM | POA: Insufficient documentation

## 2022-08-09 DIAGNOSIS — W19XXXA Unspecified fall, initial encounter: Secondary | ICD-10-CM

## 2022-08-09 DIAGNOSIS — S80211A Abrasion, right knee, initial encounter: Secondary | ICD-10-CM | POA: Diagnosis not present

## 2022-08-09 DIAGNOSIS — N189 Chronic kidney disease, unspecified: Secondary | ICD-10-CM | POA: Diagnosis not present

## 2022-08-09 DIAGNOSIS — S60221A Contusion of right hand, initial encounter: Secondary | ICD-10-CM | POA: Diagnosis not present

## 2022-08-09 DIAGNOSIS — S6992XA Unspecified injury of left wrist, hand and finger(s), initial encounter: Secondary | ICD-10-CM | POA: Diagnosis present

## 2022-08-09 DIAGNOSIS — I509 Heart failure, unspecified: Secondary | ICD-10-CM | POA: Diagnosis not present

## 2022-08-09 LAB — CBC WITH DIFFERENTIAL/PLATELET
Abs Immature Granulocytes: 0.05 10*3/uL (ref 0.00–0.07)
Basophils Absolute: 0 10*3/uL (ref 0.0–0.1)
Basophils Relative: 0 %
Eosinophils Absolute: 0 10*3/uL (ref 0.0–0.5)
Eosinophils Relative: 0 %
HCT: 28.8 % — ABNORMAL LOW (ref 39.0–52.0)
Hemoglobin: 9.8 g/dL — ABNORMAL LOW (ref 13.0–17.0)
Immature Granulocytes: 0 %
Lymphocytes Relative: 6 %
Lymphs Abs: 0.7 10*3/uL (ref 0.7–4.0)
MCH: 29.9 pg (ref 26.0–34.0)
MCHC: 34 g/dL (ref 30.0–36.0)
MCV: 87.8 fL (ref 80.0–100.0)
Monocytes Absolute: 0.8 10*3/uL (ref 0.1–1.0)
Monocytes Relative: 6 %
Neutro Abs: 11.1 10*3/uL — ABNORMAL HIGH (ref 1.7–7.7)
Neutrophils Relative %: 88 %
Platelets: 158 10*3/uL (ref 150–400)
RBC: 3.28 MIL/uL — ABNORMAL LOW (ref 4.22–5.81)
RDW: 13.4 % (ref 11.5–15.5)
WBC: 12.7 10*3/uL — ABNORMAL HIGH (ref 4.0–10.5)
nRBC: 0 % (ref 0.0–0.2)

## 2022-08-09 LAB — BASIC METABOLIC PANEL
Anion gap: 12 (ref 5–15)
BUN: 34 mg/dL — ABNORMAL HIGH (ref 8–23)
CO2: 20 mmol/L — ABNORMAL LOW (ref 22–32)
Calcium: 8.8 mg/dL — ABNORMAL LOW (ref 8.9–10.3)
Chloride: 97 mmol/L — ABNORMAL LOW (ref 98–111)
Creatinine, Ser: 1.85 mg/dL — ABNORMAL HIGH (ref 0.61–1.24)
GFR, Estimated: 34 mL/min — ABNORMAL LOW (ref >60)
Glucose, Bld: 131 mg/dL — ABNORMAL HIGH (ref 70–99)
Potassium: 4.7 mmol/L (ref 3.5–5.1)
Sodium: 129 mmol/L — ABNORMAL LOW (ref 135–145)

## 2022-08-09 LAB — URINALYSIS, COMPLETE (UACMP) WITH MICROSCOPIC
Bilirubin Urine: NEGATIVE
Glucose, UA: NEGATIVE mg/dL
Hgb urine dipstick: NEGATIVE
Ketones, ur: NEGATIVE mg/dL
Nitrite: POSITIVE — AB
Protein, ur: 30 mg/dL — AB
Specific Gravity, Urine: 1.017 (ref 1.005–1.030)
pH: 5 (ref 5.0–8.0)

## 2022-08-09 MED ORDER — FOSFOMYCIN TROMETHAMINE 3 G PO PACK
3.0000 g | PACK | Freq: Once | ORAL | Status: AC
  Administered 2022-08-09: 3 g via ORAL
  Filled 2022-08-09: qty 3

## 2022-08-09 MED ORDER — SODIUM CHLORIDE 0.9 % IV BOLUS
500.0000 mL | Freq: Once | INTRAVENOUS | Status: AC
  Administered 2022-08-09: 500 mL via INTRAVENOUS

## 2022-08-09 NOTE — ED Triage Notes (Addendum)
Pt. To ED for multiple lacerations and skin tears r/t trip and fall. Pt. States he was taking the trash out and tripped on the curb. Abrasions to right knee right hand and right face. Pt. Denies LOC, and head trauma, however, has small abrasions to right side of the face. Pt. Denies pain, n/v, dizziness, CP or SOB. Pt is on blood thinners.

## 2022-08-09 NOTE — Telephone Encounter (Signed)
[name] went to the ED yesterday for abdominal pain and was diagnosed with constipation.  Contrast CT showed multiple renal cysts and the bladder is decompressed via suprapubic catheter.  His wife called this am with concerns of dark urine.  He had no other symptoms of an UTI (pain, gross heme, fever or chills).  I advised her to increase his water intake to four bottles daily.   She will reach out to [place] on Monday if his urine still looks concerning.  They also have an appointment with Dr. Sampson Goon at 8 am Monday.

## 2022-08-09 NOTE — ED Provider Notes (Signed)
[facility] Provider Note    Event Date/Time   First MD Initiated Contact with Patient 08/09/22 1436     (approximate)  History   Chief Complaint: Fall  HPI  TEE RICHESON is a 89 y.o. male with a past medical history of BPH CHF, hypertension, hyperlipidemia presents to the emergency department after a fall.  Patient was seen in the emergency department yesterday for abdominal pain.  States had a negative workup he went home.  Patient had 2 large bowel movements per patient and wife and he has not had any symptoms since.  Patient states he was outside watering the flowers today when he went to step onto the curb and did not lift his foot high enough causing him to fall forwards.  Patient landed on his hands and knees has several abrasions.  Did hit his head but denies LOC denies headache.  Physical Exam   Triage Vital Signs: ED Triage Vitals  Encounter Vitals Group     BP 08/09/22 1206 (!) 92/42     Systolic BP Percentile --      Diastolic BP Percentile --      Pulse Rate 08/09/22 1206 63     Resp 08/09/22 1206 20     Temp 08/09/22 1206 98.4 F (36.9 C)     Temp Source 08/09/22 1206 Oral     SpO2 08/09/22 1206 95 %     Weight 08/09/22 1208 178 lb 12.7 oz (81.1 kg)     Height 08/09/22 1208 5\' 10"  (1.778 m)     Head Circumference --      Peak Flow --      Pain Score 08/09/22 1208 0     Pain Loc --      Pain Education --      Exclude from Growth Chart --     Most recent vital signs: Vitals:   08/09/22 1206  BP: (!) 92/42  Pulse: 63  Resp: 20  Temp: 98.4 F (36.9 C)  SpO2: 95%    General: Awake, no distress.  Small abrasion to right forehead, hemostatic CV:  Good peripheral perfusion.  Regular rate and rhythm  Resp:  Normal effort.  Equal breath sounds bilaterally.  Abd:  No distention.  Soft, nontender.  No rebound or guarding. Other:  Good range of motion all extremities including bilateral knees.  Patient has an abrasion over the right  knee.  He has ecchymosis to the right and left hand with moderate tenderness to palpation of the palmar aspect of the left hand at the base of the thumb.  Will obtain x-ray image.   ED Results / Procedures / Treatments   RADIOLOGY  I have reviewed and interpreted CT head images.  No bleed seen on my evaluation. Radiology is read the CT scan of the head and C-spine is negative.   MEDICATIONS ORDERED IN ED: Medications  sodium chloride 0.9 % bolus 500 mL (500 mLs Intravenous New Bag/Given 08/09/22 1412)     IMPRESSION / MDM / ASSESSMENT AND PLAN / ED COURSE  I reviewed the triage vital signs and the nursing notes.  Patient's presentation is most consistent with acute presentation with potential threat to life or bodily function.  Patient presents to the emergency department for a fall.  Patient was seen in the emergency department yesterday for abdominal pain states that has completely resolved.  States after having a bowel movement at home he has not had any symptoms since.  Patient's  workup today shows a reassuring CT scan of the head and C-spine.  Patient does have what appears to be hematoma on the left hand with tenderness to palpation we will obtain an x-ray.  Patient's lab work does show hyponatremia 129 not significantly changed from yesterday.  Patient given 500 cc of normal saline.  I discussed with the patient to increase his salt intake (patient has been on a no salt/low salt diet) and to follow-up with his doctor on Monday for recheck of his lab work.  Chronic renal insufficiency unchanged from historical values.  Anemia largely unchanged.  Slight leukocytosis.  Patient has a completely benign abdominal exam today.  Wife has noted some darker urine recently, patient has a suprapubic catheter but is requesting a urine sample be performed which I believe is reasonable.  We will check a urine sample and continue to closely monitor.  Patient overall appears very well states he is ready to  go home.  Patient's hand x-ray is negative for fracture.  Patient's urinalysis shows many bacteria but this is a bag sample.  We will dose a one-time dose of fosfomycin as a precaution I will send a urine culture.  Patient to follow-up with his doctor.  States he is ready go home.  FINAL CLINICAL IMPRESSION(S) / ED DIAGNOSES   Fall Hyponatremia Abrasion   Note:  This document was prepared using Dragon voice recognition software and may include unintentional dictation errors.   Minna Antis, MD 08/09/22 [phone]

## 2022-08-10 ENCOUNTER — Emergency Department: Payer: Medicare Other

## 2022-08-10 ENCOUNTER — Emergency Department
Admission: EM | Admit: 2022-08-10 | Discharge: 2022-08-10 | Disposition: A | Discharge status: Home / Self Care | Payer: Medicare Other | Attending: Emergency Medicine | Admitting: Emergency Medicine

## 2022-08-10 ENCOUNTER — Encounter: Payer: Self-pay | Admitting: Emergency Medicine

## 2022-08-10 ENCOUNTER — Other Ambulatory Visit: Payer: Self-pay

## 2022-08-10 DIAGNOSIS — Z1152 Encounter for screening for COVID-19: Secondary | ICD-10-CM | POA: Diagnosis not present

## 2022-08-10 DIAGNOSIS — R7989 Other specified abnormal findings of blood chemistry: Secondary | ICD-10-CM | POA: Diagnosis not present

## 2022-08-10 DIAGNOSIS — R509 Fever, unspecified: Secondary | ICD-10-CM | POA: Diagnosis not present

## 2022-08-10 DIAGNOSIS — N390 Urinary tract infection, site not specified: Secondary | ICD-10-CM | POA: Insufficient documentation

## 2022-08-10 DIAGNOSIS — N179 Acute kidney failure, unspecified: Secondary | ICD-10-CM

## 2022-08-10 DIAGNOSIS — I110 Hypertensive heart disease with heart failure: Secondary | ICD-10-CM | POA: Insufficient documentation

## 2022-08-10 DIAGNOSIS — I509 Heart failure, unspecified: Secondary | ICD-10-CM | POA: Diagnosis not present

## 2022-08-10 DIAGNOSIS — E871 Hypo-osmolality and hyponatremia: Secondary | ICD-10-CM

## 2022-08-10 LAB — CBC WITH DIFFERENTIAL/PLATELET
Abs Immature Granulocytes: 0.03 10*3/uL (ref 0.00–0.07)
Basophils Absolute: 0 10*3/uL (ref 0.0–0.1)
Basophils Relative: 0 %
Eosinophils Absolute: 0 10*3/uL (ref 0.0–0.5)
Eosinophils Relative: 0 %
HCT: 26.7 % — ABNORMAL LOW (ref 39.0–52.0)
Hemoglobin: 9.1 g/dL — ABNORMAL LOW (ref 13.0–17.0)
Immature Granulocytes: 0 %
Lymphocytes Relative: 4 %
Lymphs Abs: 0.3 10*3/uL — ABNORMAL LOW (ref 0.7–4.0)
MCH: 30 pg (ref 26.0–34.0)
MCHC: 34.1 g/dL (ref 30.0–36.0)
MCV: 88.1 fL (ref 80.0–100.0)
Monocytes Absolute: 0.2 10*3/uL (ref 0.1–1.0)
Monocytes Relative: 2 %
Neutro Abs: 7.9 10*3/uL — ABNORMAL HIGH (ref 1.7–7.7)
Neutrophils Relative %: 94 %
Platelets: 127 10*3/uL — ABNORMAL LOW (ref 150–400)
RBC: 3.03 MIL/uL — ABNORMAL LOW (ref 4.22–5.81)
RDW: 13.6 % (ref 11.5–15.5)
WBC: 8.4 10*3/uL (ref 4.0–10.5)
nRBC: 0 % (ref 0.0–0.2)

## 2022-08-10 LAB — URINALYSIS, ROUTINE W REFLEX MICROSCOPIC
Bilirubin Urine: NEGATIVE
Glucose, UA: NEGATIVE mg/dL
Ketones, ur: NEGATIVE mg/dL
Nitrite: POSITIVE — AB
Protein, ur: 100 mg/dL — AB
Specific Gravity, Urine: 1.017 (ref 1.005–1.030)
pH: 5 (ref 5.0–8.0)

## 2022-08-10 LAB — COMPREHENSIVE METABOLIC PANEL
ALT: 100 U/L — ABNORMAL HIGH (ref 0–44)
AST: 84 U/L — ABNORMAL HIGH (ref 15–41)
Albumin: 3.8 g/dL (ref 3.5–5.0)
Alkaline Phosphatase: 106 U/L (ref 38–126)
Anion gap: 10 (ref 5–15)
BUN: 42 mg/dL — ABNORMAL HIGH (ref 8–23)
CO2: 18 mmol/L — ABNORMAL LOW (ref 22–32)
Calcium: 8.5 mg/dL — ABNORMAL LOW (ref 8.9–10.3)
Chloride: 100 mmol/L (ref 98–111)
Creatinine, Ser: 2.12 mg/dL — ABNORMAL HIGH (ref 0.61–1.24)
GFR, Estimated: 29 mL/min — ABNORMAL LOW (ref >60)
Glucose, Bld: 145 mg/dL — ABNORMAL HIGH (ref 70–99)
Potassium: 4.3 mmol/L (ref 3.5–5.1)
Sodium: 128 mmol/L — ABNORMAL LOW (ref 135–145)
Total Bilirubin: 3 mg/dL — ABNORMAL HIGH (ref 0.3–1.2)
Total Protein: 6.3 g/dL — ABNORMAL LOW (ref 6.5–8.1)

## 2022-08-10 LAB — SARS CORONAVIRUS 2 BY RT PCR: SARS Coronavirus 2 by RT PCR: NEGATIVE

## 2022-08-10 LAB — LACTIC ACID, PLASMA: Lactic Acid, Venous: 1.2 mmol/L (ref 0.5–1.9)

## 2022-08-10 MED ORDER — FOSFOMYCIN TROMETHAMINE 3 G PO PACK: 3.0000 g | PACK | ORAL | 0 refills | Status: AC

## 2022-08-10 NOTE — ED Notes (Signed)
Pt c/o fever of 100.6 and chills that started this am. Pt seen here yesterday for a UTI. Pt took 2 tylenol this morning. Current temp is 98.3.

## 2022-08-10 NOTE — ED Notes (Signed)
EDP replaced suprapubic catheter.

## 2022-08-10 NOTE — Discharge Instructions (Addendum)
Your testing in the emergency department did not show any new lung infections, gallbladder infection, and you are negative for COVID infection.  You got the correct treatment for urinary tract infection yesterday in the emergency department, and your Foley catheter was replaced today.  I have ordered for a couple more doses of fosfomycin to be taken as prescribed, and these additional doses are for more complicated urinary tract infections that do not respond to initial treatment.  Call your urologist to discuss your urine tests and for a follow-up appointment.  Please follow-up with your doctor this week for an appointment.  Have them recheck your blood test including your kidney numbers (creatinine) and sodium level - both of which were slightly abnormal today.  See your urologist this week and discuss your urine tests and further antibiotic treatment and testing as needed.

## 2022-08-10 NOTE — ED Provider Notes (Signed)
[facility] Provider Note    Event Date/Time   First MD Initiated Contact with Patient 08/10/22 [phone]     (approximate)   History   Fever   HPI  Shawn Andrews is a 89 y.o. male   Past medical history of BPH, CHF, hypertension, hyperlipidemia with chronic indwelling Foley catheter last changed approximately 6 weeks ago presents the emergency department with fever 100.6 last night with chills.  He has not been in the emergency department several times in the last several days with a fall, abdominal pain, with negative workup.  He states that he feels "fine".  He does note that he felt chills and felt hot last night and family members at bedside report fever.  They took Tylenol and he is afebrile in the emergency department now.  He denies abdominal pain chest pain.  Wife reports mild cough and nasal congestion over the last several days.  Independent Historian contributed to assessment above: Wife and daughter at bedside corroborate information past medical history as above.    Physical Exam   Triage Vital Signs: ED Triage Vitals  Encounter Vitals Group     BP 08/10/22 0754 118/73     Systolic BP Percentile --      Diastolic BP Percentile --      Pulse Rate 08/10/22 0754 93     Resp 08/10/22 0754 20     Temp 08/10/22 0754 98.3 F (36.8 C)     Temp Source 08/10/22 0754 Oral     SpO2 08/10/22 0754 94 %     Weight 08/10/22 0747 185 lb (83.9 kg)     Height 08/10/22 0747 5\' 10"  (1.778 m)     Head Circumference --      Peak Flow --      Pain Score 08/10/22 0746 0     Pain Loc --      Pain Education --      Exclude from Growth Chart --     Most recent vital signs: Vitals:   08/10/22 1130 08/10/22 1148  BP: 119/65 119/65  Pulse: 77 77  Resp:  18  Temp:    SpO2: 99% 99%    General: Awake, no distress.  CV:  Good peripheral perfusion.  Resp:  Normal effort.  Abd:  No distention.  Other:  Comfortable appearing normal hemodynamics no fever.   Clear lungs.  Soft nontender abdomen.  Neck supple with full range of motion, does not appear toxic   ED Results / Procedures / Treatments   Labs (all labs ordered are listed, but only abnormal results are displayed) Labs Reviewed  COMPREHENSIVE METABOLIC PANEL - Abnormal; Notable for the following components:      Result Value   Sodium 128 (*)    CO2 18 (*)    Glucose, Bld 145 (*)    BUN 42 (*)    Creatinine, Ser 2.12 (*)    Calcium 8.5 (*)    Total Protein 6.3 (*)    AST 84 (*)    ALT 100 (*)    Total Bilirubin 3.0 (*)    GFR, Estimated 29 (*)    All other components within normal limits  CBC WITH DIFFERENTIAL/PLATELET - Abnormal; Notable for the following components:   RBC 3.03 (*)    Hemoglobin 9.1 (*)    HCT 26.7 (*)    Platelets 127 (*)    Neutro Abs 7.9 (*)    Lymphs Abs 0.3 (*)  All other components within normal limits  URINALYSIS, ROUTINE W REFLEX MICROSCOPIC - Abnormal; Notable for the following components:   Color, Urine AMBER (*)    APPearance CLOUDY (*)    Hgb urine dipstick SMALL (*)    Protein, ur 100 (*)    Nitrite POSITIVE (*)    Leukocytes,Ua MODERATE (*)    Bacteria, UA MANY (*)    All other components within normal limits  SARS CORONAVIRUS 2 BY RT PCR  LACTIC ACID, PLASMA     I ordered and reviewed the above labs they are notable for LFTs and bilirubin are elevated compared to prior, mildly.  White blood cell count is normal.  Hyponatremia 128 not markedly changed from testing last week, and creatinine is 2.12 slightly increased from prior testing   RADIOLOGY I independently reviewed and interpreted chest x-ray and I see no obvious focalities or pneumothorax   PROCEDURES:  Critical Care performed: No  Procedures   MEDICATIONS ORDERED IN ED: Medications - No data to display   IMPRESSION / MDM / ASSESSMENT AND PLAN / ED COURSE  I reviewed the triage vital signs and the nursing notes.                                Patient's  presentation is most consistent with acute presentation with potential threat to life or bodily function.  Differential diagnosis includes, but is not limited to, urinary tract infection, respiratory infection, viral URI, bacterial pneumonia, cholecystitis, intra-abdominal flexion   The patient is on the cardiac monitor to evaluate for evidence of arrhythmia and/or significant heart rate changes.  MDM:    Fever and chills last night with potential infectious sources including urinary tract infection, intra-abdominal infection/cholecystitis, respiratory infection.  Concern for urinary infection due to chronic indwelling Foley catheter treated with fosfomycin yesterday with bacteriuria.  Change Foley catheter today.  If all other infectious sources are unremarkable, can prescribe 2 additional doses of fosfomycin to be taken daily 3 and 6 and follow-up with urologist.  Consider cholecystitis given increasing LFTs, get a ultrasound.  Bedside ultrasound shows no overt cholecystitis.  He has no pain in the area, check CBD for choledocholithiasis/cholangitis though doubtful given no persistent fever nontoxic appearance no pain in the right upper quadrant no jaundice.  Consider respiratory infection with mild cough and nasal congestion check for COVID, chest x-ray shows no signs of bacterial pneumonia.  Needs close follow-up with PMD due to his AKI/hyponatremia not markedly changed from last check, mild compared to prior testing.   --- Patient has been stable in the emergency department, ultrasound of the right upper quadrant negative, no biliary dilation and no signs of cholecystitis. His Foley was replaced. His COVID test and chest x-ray were negative Been treated with fosfomycin 1 dose yesterday already.  He is no longer febrile but did take Tylenol.  I will prescribe 2 more doses of fosfomycin to be taken every 3 days to complete a 3-dose course -his next dose will be due on Monday and I advised  that he call his urologist to discuss this plan as well as further urinary tract infection testing as needed. I informed him of his AKI and hyponatremia and LFTs which he will follow-up with PMD to get rechecked.        FINAL CLINICAL IMPRESSION(S) / ED DIAGNOSES   Final diagnoses:  Fever, unspecified fever cause  Lower urinary tract infectious disease  Elevated  LFTs  AKI (acute kidney injury) (HCC)  Hyponatremia     Rx / DC Orders   ED Discharge Orders          Ordered    fosfomycin (MONUROL) 3 g PACK  Every 3 DAYS        08/10/22 1152             Note:  This document was prepared using Dragon voice recognition software and may include unintentional dictation errors.    Pilar Jarvis, MD 08/10/22 1154

## 2022-08-10 NOTE — ED Triage Notes (Signed)
Pt in via [facility] from home, was seen yesterday for a fall.  Per EMS, patient began running a fever during the night, Tyelnol taken PTA.  Wife was concerned for possible UTI.  Looks like urinalysis was completed yesterday but spouse said they never received the results of that test and according to d/c papers, patient was not treated for at UTI.  Patient presents w/ chronic urinary catheter; states it is changed q6 weeks by [facility].  Patient A/Ox4, NAD noted at this time.   CBG 164 20G PIV RFA upon arrival.

## 2022-08-12 ENCOUNTER — Telehealth: Payer: Self-pay | Admitting: *Deleted

## 2022-08-12 LAB — URINE CULTURE: Culture: >=100000 — AB

## 2022-08-12 NOTE — Telephone Encounter (Signed)
Left detailed VM on answering machine, advised pt to call if any further questions

## 2022-08-12 NOTE — Telephone Encounter (Signed)
Wife calling stating pt went to ER 08/10/2022 and was diagnosed with UTI, culture grew out multiple bacteria. Please advise where I can schedule him for follow-up.  Next appt 08/20/2022 for cath change. Pt was prescribed fosfomycin

## 2022-08-15 ENCOUNTER — Ambulatory Visit: Payer: Medicare Other | Admitting: Urology

## 2022-08-19 NOTE — Progress Notes (Unsigned)
08/20/2022 2:15 PM   Shawn Andrews Jun 11, 1928 [id number]  Referring provider: Mick Sell, MD [address] [place],  [place] [address]  Urological history: 1.  Urinary retention -Secondary to massive BPH -cysto (01/2021) - massively enlarged prostate with severe trilobar coaptation at least 7 cm length - Mild catheter cystitis heavely trbaeculate with divertivuli  - Heavily trabeculated with diverticula and saccules -Managed with SPT exchanged every 6 weeks  2. High risk  hematuria -former smoker -CTU (01/2021) - no GU malignancies noted -cysto (01/2021) - massive BPH   Chief Complaint  Patient presents with   Urinary Retention    HPI: Shawn Andrews is a 89 y.o. male who presents today for ED follow up.   Previous records reviewed.  He was seen in the ED on July 20 for fevers and diagnosed with UTI and placed on 3 doses of fosfomycin.  His WBC count was not elevated and his lactic acid was normal.   His urine culture grew out raoultella planticola, klebsiella pneumonia and pseudomonas aeruginosa.  CT w/o worrisome findings.   Repeat urine culture at his PCPs office was positive for Klebsiella pneumoniae.    Today, he feels well.   Patient denies any modifying or aggravating factors.  Patient denies any gross hematuria, dysuria or suprapubic/flank pain.  Patient denies any fevers, chills, nausea or vomiting.   CATH UA yellow slightly cloudy, specific gravity 1.020, pH 5.5, 0-5 WBCs, 0-2 RBCs, 0-10 epithelial cells, hyaline cast present, amorphous sediment present and many bacteria.    PMH: Past Medical History:  Diagnosis Date   Abnormal drug screen 06/22/2014   inapprop neg xanax rpt 3 mo (06/2014)   BPH (benign prostatic hypertrophy) 01/21/1998   has had 3 biopsies in past ([facility]) decided to stop PSA/DRE   CHF (congestive heart failure) (HCC)    Coronary artery disease    Hyperlipidemia 01/21/2002   Hypertension 05/22/2003   Left lumbar radiculopathy     Osteoarthritis 01/22/1988   knees, lumbar spondylosis and listhesis    Surgical History: Past Surgical History:  Procedure Laterality Date   CATARACT EXTRACTION  2013   bilateral   CORONARY ANGIOGRAPHY N/A 03/29/2022   Procedure: CORONARY ANGIOGRAPHY;  Surgeon: Alwyn Pea, MD;  Location: [facility];  Service: Cardiovascular;  Laterality: N/A;   CORONARY STENT INTERVENTION N/A 03/27/2022   Procedure: CORONARY STENT INTERVENTION;  Surgeon: Alwyn Pea, MD;  Location: [facility];  Service: Cardiovascular;  Laterality: N/A;   CORONARY STENT INTERVENTION N/A 03/29/2022   Procedure: CORONARY STENT INTERVENTION;  Surgeon: Alwyn Pea, MD;  Location: [facility];  Service: Cardiovascular;  Laterality: N/A;   ESI Left 08/2014, 11/2014, 08/2015   L S1, L L5/S1 transforaminal ESI; L4/5 L5/S1 zygapophysial injections, L S1 transforaminal ([facility])   FINGER SURGERY     right middle, partial traumatic amputation   IR CATHETER TUBE CHANGE  03/02/2021   KNEE ARTHROSCOPY  1990   right   KNEE SURGERY  04/2006   right partial knee replacement in [place] - rec ppx abx for any invasive procedure   LEFT HEART CATH AND CORONARY ANGIOGRAPHY N/A 01/18/2021   Procedure: LEFT HEART CATH AND CORONARY ANGIOGRAPHY;  Surgeon: Lamar Blinks, MD;  Location: [facility];  Service: Cardiovascular;  Laterality: N/A;   LEFT HEART CATH AND CORONARY ANGIOGRAPHY N/A 03/27/2022   Procedure: LEFT HEART CATH AND CORONARY ANGIOGRAPHY;  Surgeon: Alwyn Pea, MD;  Location:  [facility];  Service: Cardiovascular;  Laterality: N/A;    Home Medications:  Allergies as of 08/20/2022       Reactions   Penicillins Swelling   Of tongue Has patient had a PCN reaction causing immediate rash, facial/tongue/throat swelling, SOB or lightheadedness with hypotension: Yes Has patient had a PCN reaction causing severe rash involving mucus membranes or skin necrosis:  No Has patient had a PCN reaction that required hospitalization: No Has patient had a PCN reaction occurring within the last 10 years: No If all of the above answers are "NO", then may proceed with Cephalosporin use.   Cialis [tadalafil] Other (See Comments)   Indigestion   Lipitor [atorvastatin] Other (See Comments)   Severe Myalgias   Lisinopril Cough        Medication List        Accurate as of August 20, 2022  2:15 PM. If you have any questions, ask your nurse or doctor.          acetaminophen 650 MG CR tablet Commonly known as: Tylenol 8 Hour Take 1 tablet (650 mg total) by mouth 2 (two) times a day.   aspirin 81 MG chewable tablet Chew 1 tablet by mouth daily.   clopidogrel 75 MG tablet Commonly known as: PLAVIX Take 75 mg by mouth daily.   cyanocobalamin 1000 MCG tablet Take 1,000 mcg by mouth daily.   docusate sodium 100 MG capsule Commonly known as: Colace Take 1 capsule (100 mg total) by mouth daily.   famotidine 20 MG tablet Commonly known as: PEPCID TAKE 1 TABLET BY MOUTH AT  BEDTIME   finasteride 5 MG tablet Commonly known as: PROSCAR TAKE 1 TABLET BY MOUTH  DAILY   furosemide 20 MG tablet Commonly known as: LASIX Take 1 tablet by mouth every other day.   hydrALAZINE 50 MG tablet Commonly known as: APRESOLINE Take 1 tablet (50 mg total) by mouth every 8 (eight) hours.   isosorbide mononitrate 60 MG 24 hr tablet Commonly known as: IMDUR Take 1 tablet (60 mg total) by mouth 2 (two) times daily.   loratadine 10 MG tablet Commonly known as: CLARITIN Take 1 tablet (10 mg total) by mouth daily as needed for allergies.   metoprolol tartrate 50 MG tablet Commonly known as: LOPRESSOR Take 0.5 tablets (25 mg total) by mouth 2 (two) times daily.   nitroGLYCERIN 0.4 MG SL tablet Commonly known as: NITROSTAT Place 1 tablet (0.4 mg total) under the tongue every 5 (five) minutes x 3 doses as needed for chest pain.   pantoprazole 40 MG  tablet Commonly known as: PROTONIX Take 40 mg by mouth 2 (two) times daily.   polyethylene glycol powder 17 GM/SCOOP powder Commonly known as: GLYCOLAX/MIRALAX Take 8.5-17 g by mouth daily as needed for moderate constipation.   primidone 50 MG tablet Commonly known as: MYSOLINE Take 2 tablets (100 mg total) by mouth 3 (three) times daily.   rosuvastatin 40 MG tablet Commonly known as: CRESTOR Take 1 tablet (40 mg total) by mouth at bedtime.   Vitamin D3 25 MCG (1000 UT) Caps Take 1 capsule (1,000 Units total) by mouth daily.        Allergies:  Allergies  Allergen Reactions   Penicillins Swelling    Of tongue Has patient had a PCN reaction causing immediate rash, facial/tongue/throat swelling, SOB or lightheadedness with hypotension: Yes Has patient had a PCN reaction causing severe rash involving mucus membranes or skin necrosis: No Has patient had a  PCN reaction that required hospitalization: No Has patient had a PCN reaction occurring within the last 10 years: No If all of the above answers are "NO", then may proceed with Cephalosporin use.   Cialis [Tadalafil] Other (See Comments)    Indigestion   Lipitor [Atorvastatin] Other (See Comments)    Severe Myalgias   Lisinopril Cough    Family History: Family History  Problem Relation Age of Onset   Hypertension Mother    Heart disease Brother 68       MI   Diabetes Brother    Cancer Daughter        melanoma   Stroke Neg Hx     Social History:  reports that he quit smoking about 21 years ago. His smoking use included pipe and cigarettes. He has been exposed to tobacco smoke. His smokeless tobacco use includes chew. He reports current alcohol use. He reports that he does not use drugs.  ROS: Pertinent ROS in HPI  Physical Exam: BP 130/70   Pulse 74   Ht 5\' 10"  (1.778 m)   Wt 183 lb (83 kg)   BMI 26.26 kg/m   Constitutional:  Well nourished. Alert and oriented, No acute distress. HEENT: [place] AT, moist mucus  membranes.  Trachea midline Cardiovascular: No clubbing, cyanosis, or edema. Respiratory: Normal respiratory effort, no increased work of breathing. Neurologic: Grossly intact, no focal deficits, moving all 4 extremities. Psychiatric: Normal mood and affect.  Laboratory Data: Lab Results  Component Value Date   WBC 8.4 08/10/2022   HGB 9.1 (L) 08/10/2022   HCT 26.7 (L) 08/10/2022   MCV 88.1 08/10/2022   PLT 127 (L) 08/10/2022    Lab Results  Component Value Date   CREATININE 2.12 (H) 08/10/2022    Lab Results  Component Value Date   AST 84 (H) 08/10/2022   Lab Results  Component Value Date   ALT 100 (H) 08/10/2022   Urinalysis Results for orders placed or performed in visit on 08/20/22  Microscopic Examination   Urine  Result Value Ref Range   WBC, UA 0-5 0 - 5 /hpf   RBC, Urine 0-2 0 - 2 /hpf   Epithelial Cells (non renal) 0-10 0 - 10 /hpf   Casts Present (A) None seen /lpf   Cast Type Hyaline casts N/A   Crystals Present (A) N/A   Crystal Type Amorphous Sediment N/A   Bacteria, UA Many (A) None seen/Few  Urinalysis, Complete  Result Value Ref Range   Specific Gravity, UA 1.020 1.005 - 1.030   pH, UA 5.5 5.0 - 7.5   Color, UA Yellow Yellow   Appearance Ur Hazy (A) Clear   Leukocytes,UA Negative Negative   Protein,UA Negative Negative/Trace   Glucose, UA Negative Negative   Ketones, UA Negative Negative   RBC, UA Negative Negative   Bilirubin, UA Negative Negative   Urobilinogen, Ur 0.2 0.2 - 1.0 mg/dL   Nitrite, UA Negative Negative   Microscopic Examination See below:      I have reviewed the labs.   Pertinent Imaging: CLINICAL DATA:  Right lower quadrant pain   EXAM: CT ABDOMEN AND PELVIS WITH CONTRAST   TECHNIQUE: Multidetector CT imaging of the abdomen and pelvis was performed using the standard protocol following bolus administration of intravenous contrast.   RADIATION DOSE REDUCTION: This exam was performed according to  the departmental dose-optimization program which includes automated exposure control, adjustment of the mA and/or kV according to patient size and/or use of iterative reconstruction  technique.   CONTRAST:  80mL OMNIPAQUE IOHEXOL 300 MG/ML  SOLN   COMPARISON:  CTA chest/abdomen/pelvis 03/25/2022   FINDINGS: Lower chest: The lung bases are clear. There are dense coronary artery calcifications and mild aortic valve calcifications. The heart is enlarged. There is no pericardial effusion.   Hepatobiliary: The liver is unremarkable. The gallbladder is distended but without evidence of acute cholecystitis. There is no biliary ductal dilatation.   Pancreas: Unremarkable.   Spleen: Unremarkable.   Adrenals/Urinary Tract: Multiple bilateral renal cysts are noted requiring no specific imaging follow-up. There are no suspicious renal lesions there are no stones in either kidney or along the course of either ureter. The bladder is decompressed via suprapubic catheter.   Stomach/Bowel: The stomach is unremarkable. There is no evidence of bowel obstruction. There is no abnormal bowel wall thickening or inflammatory change. There is extensive colonic diverticulosis without evidence of acute diverticulitis. The appendix is normal (5-41).   Vascular/Lymphatic: There is calcified plaque throughout the nonaneurysmal abdominal aorta. The major branch vessels are patent. The main portal and splenic veins are patent. There is no abdominal or pelvic lymphadenopathy.   Reproductive: The prostate is enlarged, unchanged.   Other: There is no ascites or free air. There is a small fat containing umbilical hernia.   Musculoskeletal: There is no acute osseous abnormality or suspicious osseous lesion. Bilateral L5-S1 pars defects with associated grade 1 anterolisthesis are unchanged.   IMPRESSION: 1. No acute findings in the abdomen or pelvis. Normal appendix. 2. Extensive colonic diverticulosis  without evidence of acute diverticulitis.     Electronically Signed   By: Lesia Hausen M.D.   On: 08/08/2022 12:13 I have independently reviewed the films.    Assessment & Plan:    1. UTI -He has been on 3 doses of fosfomycin and 5 days of Cipro, explained that today's UA did contain bacteria but is likely because the suprapubic tube has been in for 2 weeks -I will send the urine culture, but we will not start antibiotic unless he develops symptoms of UTI in the future  2. Urinary retention -SPT changed in the ED on 08/10/2022  -schedule next change in 5 weeks.   Return in about 5 weeks (around 09/24/2022) for SPT exchange .  These notes generated with voice recognition software. I apologize for typographical errors.  Cloretta Ned  [facility] [address]  [address] [place], [place] [address] [phone]

## 2022-08-20 ENCOUNTER — Encounter: Payer: Self-pay | Admitting: Urology

## 2022-08-20 ENCOUNTER — Ambulatory Visit: Payer: Medicare Other | Admitting: Urology

## 2022-08-20 VITALS — BP 130/70 | HR 74 | Ht 70.0 in | Wt 183.0 lb

## 2022-08-20 DIAGNOSIS — Z09 Encounter for follow-up examination after completed treatment for conditions other than malignant neoplasm: Secondary | ICD-10-CM

## 2022-08-20 DIAGNOSIS — Z87440 Personal history of urinary (tract) infections: Secondary | ICD-10-CM

## 2022-08-20 DIAGNOSIS — R339 Retention of urine, unspecified: Secondary | ICD-10-CM | POA: Diagnosis not present

## 2022-08-20 DIAGNOSIS — N390 Urinary tract infection, site not specified: Secondary | ICD-10-CM

## 2022-08-20 LAB — URINALYSIS, COMPLETE
Bilirubin, UA: NEGATIVE
Glucose, UA: NEGATIVE
Ketones, UA: NEGATIVE
Leukocytes,UA: NEGATIVE
Nitrite, UA: NEGATIVE
Protein,UA: NEGATIVE
RBC, UA: NEGATIVE
Specific Gravity, UA: 1.02 (ref 1.005–1.030)
Urobilinogen, Ur: 0.2 mg/dL (ref 0.2–1.0)
pH, UA: 5.5 (ref 5.0–7.5)

## 2022-08-20 LAB — MICROSCOPIC EXAMINATION

## 2022-09-20 NOTE — Progress Notes (Unsigned)
Suprapubic Cath Change  Patient is present today for a suprapubic catheter change due to urinary retention.  8 ml of water was drained from the balloon, a 16 FR foley cath was removed from the tract with out difficulty.  Site was cleaned and prepped in a sterile fashion with betadine.  A 16 FR foley cath was replaced into the tract no complications were noted. Urine return was noted, 10 ml of sterile water was inflated into the balloon and a leg bag and Dale strap was attached for drainage.  Patient tolerated well. A night bag was given to patient and proper instruction was given on how to switch bags.    Performed by: Michiel Cowboy, PA-C   Follow up: Return in about 6 weeks (around 11/05/2022) for SPT echange .   He will also continue the finasteride because even though he has an SPT in place we still want to prevent any episodes of hematuria from a an enlarged prostate.

## 2022-09-24 ENCOUNTER — Ambulatory Visit: Payer: Medicare Other | Admitting: Urology

## 2022-09-24 ENCOUNTER — Encounter: Payer: Self-pay | Admitting: Urology

## 2022-09-24 VITALS — BP 122/63 | HR 62 | Wt 181.2 lb

## 2022-09-24 DIAGNOSIS — Z9359 Other cystostomy status: Secondary | ICD-10-CM

## 2022-09-24 DIAGNOSIS — R339 Retention of urine, unspecified: Secondary | ICD-10-CM | POA: Diagnosis not present

## 2022-11-04 NOTE — Progress Notes (Unsigned)
Suprapubic Cath Change  Patient is present today for a suprapubic catheter change due to urinary retention.  8 ml of water was drained from the balloon, a 16 FR foley cath was removed from the tract with out difficulty.  Site was cleaned and prepped in a sterile fashion with betadine.  A 16 FR foley cath was replaced into the tract no complications were noted. Urine return was noted, 10 ml of sterile water was inflated into the balloon and a leg bag and Dale strap was attached for drainage.  Patient tolerated well. A night bag was given to patient and proper instruction was given on how to switch bags.    Performed by: Michiel Cowboy, PA-C   Follow up:  6 weeks for SPT exchange

## 2022-11-05 ENCOUNTER — Ambulatory Visit: Payer: Medicare Other | Admitting: Urology

## 2022-11-05 ENCOUNTER — Encounter: Payer: Self-pay | Admitting: Urology

## 2022-11-05 VITALS — BP 134/67 | HR 54 | Ht 70.0 in | Wt 182.0 lb

## 2022-11-05 DIAGNOSIS — R339 Retention of urine, unspecified: Secondary | ICD-10-CM

## 2022-11-05 DIAGNOSIS — Z9359 Other cystostomy status: Secondary | ICD-10-CM

## 2022-12-09 ENCOUNTER — Emergency Department: Payer: Medicare Other

## 2022-12-09 ENCOUNTER — Other Ambulatory Visit: Payer: Self-pay

## 2022-12-09 ENCOUNTER — Inpatient Hospital Stay
Admission: EM | Admit: 2022-12-09 | Discharge: 2022-12-13 | DRG: 280 | Disposition: A | Discharge status: Home / Self Care | Payer: Medicare Other | Attending: Osteopathic Medicine | Admitting: Osteopathic Medicine

## 2022-12-09 DIAGNOSIS — Z833 Family history of diabetes mellitus: Secondary | ICD-10-CM

## 2022-12-09 DIAGNOSIS — Z808 Family history of malignant neoplasm of other organs or systems: Secondary | ICD-10-CM

## 2022-12-09 DIAGNOSIS — Z9359 Other cystostomy status: Secondary | ICD-10-CM

## 2022-12-09 DIAGNOSIS — Z7982 Long term (current) use of aspirin: Secondary | ICD-10-CM

## 2022-12-09 DIAGNOSIS — D649 Anemia, unspecified: Secondary | ICD-10-CM | POA: Diagnosis present

## 2022-12-09 DIAGNOSIS — Z880 Allergy status to penicillin: Secondary | ICD-10-CM

## 2022-12-09 DIAGNOSIS — Z7902 Long term (current) use of antithrombotics/antiplatelets: Secondary | ICD-10-CM

## 2022-12-09 DIAGNOSIS — E669 Obesity, unspecified: Secondary | ICD-10-CM | POA: Diagnosis present

## 2022-12-09 DIAGNOSIS — Z79899 Other long term (current) drug therapy: Secondary | ICD-10-CM

## 2022-12-09 DIAGNOSIS — I2510 Atherosclerotic heart disease of native coronary artery without angina pectoris: Secondary | ICD-10-CM | POA: Diagnosis present

## 2022-12-09 DIAGNOSIS — G250 Essential tremor: Secondary | ICD-10-CM | POA: Diagnosis present

## 2022-12-09 DIAGNOSIS — J9601 Acute respiratory failure with hypoxia: Secondary | ICD-10-CM | POA: Diagnosis not present

## 2022-12-09 DIAGNOSIS — I5020 Unspecified systolic (congestive) heart failure: Secondary | ICD-10-CM | POA: Diagnosis not present

## 2022-12-09 DIAGNOSIS — Z86718 Personal history of other venous thrombosis and embolism: Secondary | ICD-10-CM

## 2022-12-09 DIAGNOSIS — E785 Hyperlipidemia, unspecified: Secondary | ICD-10-CM | POA: Insufficient documentation

## 2022-12-09 DIAGNOSIS — Z96651 Presence of right artificial knee joint: Secondary | ICD-10-CM | POA: Diagnosis present

## 2022-12-09 DIAGNOSIS — I10 Essential (primary) hypertension: Secondary | ICD-10-CM | POA: Diagnosis not present

## 2022-12-09 DIAGNOSIS — N1832 Chronic kidney disease, stage 3b: Secondary | ICD-10-CM | POA: Diagnosis present

## 2022-12-09 DIAGNOSIS — I130 Hypertensive heart and chronic kidney disease with heart failure and stage 1 through stage 4 chronic kidney disease, or unspecified chronic kidney disease: Secondary | ICD-10-CM | POA: Diagnosis present

## 2022-12-09 DIAGNOSIS — I214 Non-ST elevation (NSTEMI) myocardial infarction: Secondary | ICD-10-CM | POA: Diagnosis not present

## 2022-12-09 DIAGNOSIS — Z87891 Personal history of nicotine dependence: Secondary | ICD-10-CM

## 2022-12-09 DIAGNOSIS — N390 Urinary tract infection, site not specified: Secondary | ICD-10-CM | POA: Diagnosis not present

## 2022-12-09 DIAGNOSIS — Z1152 Encounter for screening for COVID-19: Secondary | ICD-10-CM

## 2022-12-09 DIAGNOSIS — Z955 Presence of coronary angioplasty implant and graft: Secondary | ICD-10-CM

## 2022-12-09 DIAGNOSIS — I82402 Acute embolism and thrombosis of unspecified deep veins of left lower extremity: Secondary | ICD-10-CM | POA: Diagnosis present

## 2022-12-09 DIAGNOSIS — Z888 Allergy status to other drugs, medicaments and biological substances status: Secondary | ICD-10-CM

## 2022-12-09 DIAGNOSIS — N400 Enlarged prostate without lower urinary tract symptoms: Secondary | ICD-10-CM | POA: Insufficient documentation

## 2022-12-09 DIAGNOSIS — Z8249 Family history of ischemic heart disease and other diseases of the circulatory system: Secondary | ICD-10-CM

## 2022-12-09 DIAGNOSIS — I5022 Chronic systolic (congestive) heart failure: Secondary | ICD-10-CM | POA: Diagnosis present

## 2022-12-09 LAB — RESP PANEL BY RT-PCR (RSV, FLU A&B, COVID)  RVPGX2
Influenza A by PCR: NEGATIVE
Influenza B by PCR: NEGATIVE
Resp Syncytial Virus by PCR: NEGATIVE
SARS Coronavirus 2 by RT PCR: NEGATIVE

## 2022-12-09 LAB — BASIC METABOLIC PANEL
Anion gap: 9 (ref 5–15)
BUN: 35 mg/dL — ABNORMAL HIGH (ref 8–23)
CO2: 20 mmol/L — ABNORMAL LOW (ref 22–32)
Calcium: 8.7 mg/dL — ABNORMAL LOW (ref 8.9–10.3)
Chloride: 106 mmol/L (ref 98–111)
Creatinine, Ser: 1.79 mg/dL — ABNORMAL HIGH (ref 0.61–1.24)
GFR, Estimated: 35 mL/min — ABNORMAL LOW (ref >60)
Glucose, Bld: 179 mg/dL — ABNORMAL HIGH (ref 70–99)
Potassium: 4.3 mmol/L (ref 3.5–5.1)
Sodium: 135 mmol/L (ref 135–145)

## 2022-12-09 LAB — TROPONIN I (HIGH SENSITIVITY)
Troponin I (High Sensitivity): 23 ng/L — ABNORMAL HIGH (ref <18)
Troponin I (High Sensitivity): 172 ng/L (ref <18)

## 2022-12-09 LAB — HEPATIC FUNCTION PANEL
ALT: 8 U/L (ref 0–44)
AST: 15 U/L (ref 15–41)
Albumin: 4.2 g/dL (ref 3.5–5.0)
Alkaline Phosphatase: 53 U/L (ref 38–126)
Bilirubin, Direct: 0.1 mg/dL (ref 0.0–0.2)
Indirect Bilirubin: 0.6 mg/dL (ref 0.3–0.9)
Total Bilirubin: 0.7 mg/dL (ref <1.2)
Total Protein: 6.5 g/dL (ref 6.5–8.1)

## 2022-12-09 LAB — CBC
HCT: 31.8 % — ABNORMAL LOW (ref 39.0–52.0)
Hemoglobin: 10.7 g/dL — ABNORMAL LOW (ref 13.0–17.0)
MCH: 30.3 pg (ref 26.0–34.0)
MCHC: 33.6 g/dL (ref 30.0–36.0)
MCV: 90.1 fL (ref 80.0–100.0)
Platelets: 131 10*3/uL — ABNORMAL LOW (ref 150–400)
RBC: 3.53 MIL/uL — ABNORMAL LOW (ref 4.22–5.81)
RDW: 13.7 % (ref 11.5–15.5)
WBC: 7.8 10*3/uL (ref 4.0–10.5)
nRBC: 0 % (ref 0.0–0.2)

## 2022-12-09 LAB — LIPASE, BLOOD: Lipase: 27 U/L (ref 11–51)

## 2022-12-09 MED ORDER — ASPIRIN 81 MG PO CHEW: 324.0000 mg | CHEWABLE_TABLET | ORAL | Status: AC

## 2022-12-09 MED ORDER — VITAMIN D 25 MCG (1000 UNIT) PO TABS
1000.0000 [IU] | ORAL_TABLET | Freq: Every day | ORAL | Status: DC
  Administered 2022-12-10 – 2022-12-13 (×4): 1000 [IU] via ORAL
  Filled 2022-12-09 (×4): qty 1

## 2022-12-09 MED ORDER — DOCUSATE SODIUM 100 MG PO CAPS
100.0000 mg | ORAL_CAPSULE | Freq: Every day | ORAL | Status: DC
  Administered 2022-12-10 – 2022-12-13 (×4): 100 mg via ORAL
  Filled 2022-12-09 (×4): qty 1

## 2022-12-09 MED ORDER — PANTOPRAZOLE SODIUM 40 MG IV SOLR
40.0000 mg | Freq: Once | INTRAVENOUS | Status: AC
  Administered 2022-12-09: 40 mg via INTRAVENOUS
  Filled 2022-12-09: qty 10

## 2022-12-09 MED ORDER — ASPIRIN 300 MG RE SUPP: 300.0000 mg | RECTAL | Status: AC

## 2022-12-09 MED ORDER — ONDANSETRON HCL 4 MG/2ML IJ SOLN: 4.0000 mg | Freq: Four times a day (QID) | INTRAMUSCULAR | Status: DC | PRN

## 2022-12-09 MED ORDER — ALPRAZOLAM 0.25 MG PO TABS
0.2500 mg | ORAL_TABLET | Freq: Two times a day (BID) | ORAL | Status: DC | PRN
  Administered 2022-12-11 (×2): 0.25 mg via ORAL
  Filled 2022-12-09 (×2): qty 1

## 2022-12-09 MED ORDER — TRAZODONE HCL 50 MG PO TABS
25.0000 mg | ORAL_TABLET | Freq: Every evening | ORAL | Status: DC | PRN
  Administered 2022-12-10 – 2022-12-12 (×2): 25 mg via ORAL
  Filled 2022-12-09 (×2): qty 1

## 2022-12-09 MED ORDER — MAGNESIUM HYDROXIDE 400 MG/5ML PO SUSP: 30.0000 mL | Freq: Every day | ORAL | Status: DC | PRN

## 2022-12-09 MED ORDER — IPRATROPIUM-ALBUTEROL 0.5-2.5 (3) MG/3ML IN SOLN
3.0000 mL | Freq: Once | RESPIRATORY_TRACT | Status: AC
  Administered 2022-12-09: 3 mL via RESPIRATORY_TRACT
  Filled 2022-12-09: qty 3

## 2022-12-09 MED ORDER — PRIMIDONE 50 MG PO TABS
100.0000 mg | ORAL_TABLET | Freq: Three times a day (TID) | ORAL | Status: DC
  Administered 2022-12-10 – 2022-12-13 (×10): 100 mg via ORAL
  Filled 2022-12-09 (×15): qty 2

## 2022-12-09 MED ORDER — ROSUVASTATIN CALCIUM 10 MG PO TABS
20.0000 mg | ORAL_TABLET | Freq: Every day | ORAL | Status: DC
  Administered 2022-12-10 – 2022-12-12 (×4): 20 mg via ORAL
  Filled 2022-12-09: qty 2
  Filled 2022-12-09 (×2): qty 1
  Filled 2022-12-09 (×2): qty 2

## 2022-12-09 MED ORDER — VITAMIN B-12 1000 MCG PO TABS
1000.0000 ug | ORAL_TABLET | Freq: Every day | ORAL | Status: DC
  Administered 2022-12-10 – 2022-12-13 (×4): 1000 ug via ORAL
  Filled 2022-12-09 (×3): qty 1

## 2022-12-09 MED ORDER — ALUM & MAG HYDROXIDE-SIMETH 200-200-20 MG/5ML PO SUSP
30.0000 mL | Freq: Once | ORAL | Status: AC
  Administered 2022-12-09: 30 mL via ORAL
  Filled 2022-12-09: qty 30

## 2022-12-09 MED ORDER — SODIUM CHLORIDE 0.9 % IV SOLN: INTRAVENOUS | Status: AC

## 2022-12-09 MED ORDER — HYDRALAZINE HCL 50 MG PO TABS
50.0000 mg | ORAL_TABLET | Freq: Three times a day (TID) | ORAL | Status: DC
  Administered 2022-12-10 – 2022-12-13 (×10): 50 mg via ORAL
  Filled 2022-12-09 (×10): qty 1

## 2022-12-09 MED ORDER — ASPIRIN 81 MG PO TBEC
81.0000 mg | DELAYED_RELEASE_TABLET | Freq: Every day | ORAL | Status: DC
Start: 2022-12-10 — End: 2022-12-10
  Administered 2022-12-10: 81 mg via ORAL
  Filled 2022-12-09: qty 1

## 2022-12-09 MED ORDER — FAMOTIDINE 20 MG PO TABS
20.0000 mg | ORAL_TABLET | Freq: Every day | ORAL | Status: DC
  Administered 2022-12-09 – 2022-12-12 (×4): 20 mg via ORAL
  Filled 2022-12-09 (×4): qty 1

## 2022-12-09 MED ORDER — ACETAMINOPHEN 500 MG PO TABS
1000.0000 mg | ORAL_TABLET | Freq: Once | ORAL | Status: AC
Start: 2022-12-09 — End: 2022-12-09
  Administered 2022-12-09: 1000 mg via ORAL
  Filled 2022-12-09: qty 2

## 2022-12-09 MED ORDER — PANTOPRAZOLE SODIUM 40 MG PO TBEC
40.0000 mg | DELAYED_RELEASE_TABLET | Freq: Two times a day (BID) | ORAL | Status: DC
  Administered 2022-12-10 – 2022-12-13 (×7): 40 mg via ORAL
  Filled 2022-12-09 (×7): qty 1

## 2022-12-09 MED ORDER — HEPARIN BOLUS VIA INFUSION
4000.0000 [IU] | Freq: Once | INTRAVENOUS | Status: AC
  Administered 2022-12-09: 4000 [IU] via INTRAVENOUS
  Filled 2022-12-09: qty 4000

## 2022-12-09 MED ORDER — LORATADINE 10 MG PO TABS: 10.0000 mg | ORAL_TABLET | Freq: Every day | ORAL | Status: DC | PRN

## 2022-12-09 MED ORDER — ISOSORBIDE MONONITRATE ER 60 MG PO TB24
60.0000 mg | ORAL_TABLET | Freq: Two times a day (BID) | ORAL | Status: DC
  Administered 2022-12-09 – 2022-12-13 (×8): 60 mg via ORAL
  Filled 2022-12-09 (×8): qty 1

## 2022-12-09 MED ORDER — FINASTERIDE 5 MG PO TABS
5.0000 mg | ORAL_TABLET | Freq: Every day | ORAL | Status: DC
Start: 2022-12-10 — End: 2022-12-13
  Administered 2022-12-10 – 2022-12-13 (×4): 5 mg via ORAL
  Filled 2022-12-09 (×5): qty 1

## 2022-12-09 MED ORDER — ASPIRIN 81 MG PO CHEW: 81.0000 mg | CHEWABLE_TABLET | Freq: Every day | ORAL | Status: DC

## 2022-12-09 MED ORDER — CLOPIDOGREL BISULFATE 75 MG PO TABS
75.0000 mg | ORAL_TABLET | Freq: Every day | ORAL | Status: DC
  Administered 2022-12-10 – 2022-12-13 (×4): 75 mg via ORAL
  Filled 2022-12-09 (×4): qty 1

## 2022-12-09 MED ORDER — ACETAMINOPHEN 325 MG PO TABS: 650.0000 mg | ORAL_TABLET | ORAL | Status: DC | PRN

## 2022-12-09 MED ORDER — HEPARIN (PORCINE) 25000 UT/250ML-% IV SOLN
1100.0000 [IU]/h | INTRAVENOUS | Status: DC
  Administered 2022-12-09 – 2022-12-10 (×2): 1100 [IU]/h via INTRAVENOUS
  Filled 2022-12-09 (×2): qty 250

## 2022-12-09 MED ORDER — NITROGLYCERIN 0.4 MG SL SUBL
0.4000 mg | SUBLINGUAL_TABLET | SUBLINGUAL | Status: DC | PRN
  Administered 2022-12-11 (×2): 0.4 mg via SUBLINGUAL
  Filled 2022-12-09: qty 1

## 2022-12-09 MED ORDER — METOPROLOL TARTRATE 25 MG PO TABS
25.0000 mg | ORAL_TABLET | Freq: Two times a day (BID) | ORAL | Status: DC
  Administered 2022-12-10 (×2): 25 mg via ORAL
  Filled 2022-12-09 (×3): qty 1

## 2022-12-09 MED ORDER — FUROSEMIDE 20 MG PO TABS: 20.0000 mg | ORAL_TABLET | ORAL | Status: DC

## 2022-12-09 MED ORDER — ASPIRIN 81 MG PO CHEW
324.0000 mg | CHEWABLE_TABLET | Freq: Once | ORAL | Status: AC
Start: 2022-12-09 — End: 2022-12-09
  Administered 2022-12-09: 324 mg via ORAL
  Filled 2022-12-09: qty 4

## 2022-12-09 NOTE — H&P (Signed)
[place]   PATIENT NAME: Shawn Andrews    MR#:  [id number]  DATE OF BIRTH:  10/12/1928  DATE OF ADMISSION:  12/09/2022  PRIMARY CARE PHYSICIAN: Mick Sell, MD   Patient is coming from: Home  REQUESTING/REFERRING PHYSICIAN: Artis Delay, MD  CHIEF COMPLAINT:   Chief Complaint  Patient presents with   Chest Pain    HISTORY OF PRESENT ILLNESS:  Shawn Andrews is a 89 y.o. Caucasian male with medical history significant for coronary artery disease, status post PCI 3 stents, CHF, hypertension, essential tremors, dyslipidemia and osteoarthritis, who presented to the emergency room with acute onset of chest pain that started around 5 PM.  Initially thought it was related to GERD and and took acid suppressant that did not help.  He later took 2 nitroglycerin tablets that did not help either.  He denied any associated nausea or vomiting or diaphoresis.  He denied any radiation to his pain.  No dyspnea or palpitations.  He is having mild wheezing though while talking.  No fever or chills.  No cough or hemoptysis.  No leg pain or edema or recent travels or surgeries.  No dysuria, oliguria or hematuria or flank pain.  No bleeding diathesis.  ED Course: When he came to the ER, BP was 150/83 with otherwise normal vital signs.  Labs revealed a BUN of 35 and creatinine 1.79 with a calcium of 8.7 and blood glucose of 179 with CO2 of 20 and otherwise unremarkable CMP.  High sensitive troponin I was 23 and later 172.  CBC showed anemia better than previous levels and mild thrombocytopenia.  Respiratory panel is currently pending. EKG as reviewed by me : Initial EKG showed normal sinus rhythm with rate of 92 with left axis deviation and LVH with QRS widening and with poor R wave progression. Repeat EKG showed sinus rhythm with a rate of 70 with PACs and borderline prolonged PR interval with nonspecific intraventricular conduction delay and Q waves anteroseptally with T wave inversion  laterally. Imaging: Two-view chest x-ray showed possible trace right pleural effusion.  The patient was given DuoNebs, 40 mg of IV Protonix, 4 baby aspirin, 30 mL milk of magnesia and 1 g of p.o. Tylenol.  He will be admitted to a progressive unit observation bed for further evaluation and management. PAST MEDICAL HISTORY:   Past Medical History:  Diagnosis Date   Abnormal drug screen 06/22/2014   inapprop neg xanax rpt 3 mo (06/2014)   BPH (benign prostatic hypertrophy) 01/21/1998   has had 3 biopsies in past ([facility]) decided to stop PSA/DRE   CHF (congestive heart failure) (HCC)    Coronary artery disease    Hyperlipidemia 01/21/2002   Hypertension 05/22/2003   Left lumbar radiculopathy    Osteoarthritis 01/22/1988   knees, lumbar spondylosis and listhesis    PAST SURGICAL HISTORY:   Past Surgical History:  Procedure Laterality Date   CATARACT EXTRACTION  2013   bilateral   CORONARY ANGIOGRAPHY N/A 03/29/2022   Procedure: CORONARY ANGIOGRAPHY;  Surgeon: Alwyn Pea, MD;  Location: [facility];  Service: Cardiovascular;  Laterality: N/A;   CORONARY STENT INTERVENTION N/A 03/27/2022   Procedure: CORONARY STENT INTERVENTION;  Surgeon: Alwyn Pea, MD;  Location: [facility];  Service: Cardiovascular;  Laterality: N/A;   CORONARY STENT INTERVENTION N/A 03/29/2022   Procedure: CORONARY STENT INTERVENTION;  Surgeon: Alwyn Pea, MD;  Location: [facility];  Service: Cardiovascular;  Laterality:  N/A;   ESI Left 08/2014, 11/2014, 08/2015   L S1, L L5/S1 transforaminal ESI; L4/5 L5/S1 zygapophysial injections, L S1 transforaminal ([facility])   FINGER SURGERY     right middle, partial traumatic amputation   IR CATHETER TUBE CHANGE  03/02/2021   KNEE ARTHROSCOPY  1990   right   KNEE SURGERY  04/2006   right partial knee replacement in [place] - rec ppx abx for any invasive procedure   LEFT HEART CATH AND CORONARY ANGIOGRAPHY N/A 01/18/2021    Procedure: LEFT HEART CATH AND CORONARY ANGIOGRAPHY;  Surgeon: Lamar Blinks, MD;  Location: [facility];  Service: Cardiovascular;  Laterality: N/A;   LEFT HEART CATH AND CORONARY ANGIOGRAPHY N/A 03/27/2022   Procedure: LEFT HEART CATH AND CORONARY ANGIOGRAPHY;  Surgeon: Alwyn Pea, MD;  Location: [facility];  Service: Cardiovascular;  Laterality: N/A;    SOCIAL HISTORY:   Social History   Tobacco Use   Smoking status: Former    Current packs/day: 0.00    Types: Pipe, Cigarettes    Quit date: 12/26/2000    Years since quitting: 21.9    Passive exposure: Past   Smokeless tobacco: Current    Types: Chew   Tobacco comments:    quit over 30 years ago  Substance Use Topics   Alcohol use: Yes    Alcohol/week: 0.0 standard drinks of alcohol    Comment: Occasional beer    FAMILY HISTORY:   Family History  Problem Relation Age of Onset   Hypertension Mother    Heart disease Brother 30       MI   Diabetes Brother    Cancer Daughter        melanoma   Stroke Neg Hx     DRUG ALLERGIES:   Allergies  Allergen Reactions   Penicillins Swelling    Of tongue Has patient had a PCN reaction causing immediate rash, facial/tongue/throat swelling, SOB or lightheadedness with hypotension: Yes Has patient had a PCN reaction causing severe rash involving mucus membranes or skin necrosis: No Has patient had a PCN reaction that required hospitalization: No Has patient had a PCN reaction occurring within the last 10 years: No If all of the above answers are "NO", then may proceed with Cephalosporin use.   Cialis [Tadalafil] Other (See Comments)    Indigestion   Lipitor [Atorvastatin] Other (See Comments)    Severe Myalgias   Lisinopril Cough    REVIEW OF SYSTEMS:   ROS As per history of present illness. All pertinent systems were reviewed above. Constitutional, HEENT, cardiovascular, respiratory, GI, GU, musculoskeletal, neuro, psychiatric, endocrine,  integumentary and hematologic systems were reviewed and are otherwise negative/unremarkable except for positive findings mentioned above in the HPI.   MEDICATIONS AT HOME:   Prior to Admission medications   Medication Sig Start Date End Date Taking? Authorizing Provider  acetaminophen (TYLENOL 8 HOUR) 650 MG CR tablet Take 1 tablet (650 mg total) by mouth 2 (two) times a day. 06/24/18   Eustaquio Boyden, MD  aspirin 81 MG chewable tablet Chew 1 tablet by mouth daily.    [provider]  Cholecalciferol (VITAMIN D3) 25 MCG (1000 UT) CAPS Take 1 capsule (1,000 Units total) by mouth daily. 08/25/18   Eustaquio Boyden, MD  clopidogrel (PLAVIX) 75 MG tablet Take 75 mg by mouth daily. 10/10/20   [provider]  cyanocobalamin 1000 MCG tablet Take 1,000 mcg by mouth daily.    [provider]  docusate sodium (  COLACE) 100 MG capsule Take 1 capsule (100 mg total) by mouth daily. 12/13/20   Eustaquio Boyden, MD  famotidine (PEPCID) 20 MG tablet TAKE 1 TABLET BY MOUTH AT  BEDTIME 01/23/21   Eustaquio Boyden, MD  finasteride (PROSCAR) 5 MG tablet TAKE 1 TABLET BY MOUTH  DAILY 11/17/19   Eustaquio Boyden, MD  furosemide (LASIX) 20 MG tablet Take 1 tablet by mouth every other day. 10/09/20   [provider]  hydrALAZINE (APRESOLINE) 50 MG tablet Take 1 tablet (50 mg total) by mouth every 8 (eight) hours. 03/31/22   Enedina Finner, MD  isosorbide mononitrate (IMDUR) 60 MG 24 hr tablet Take 1 tablet (60 mg total) by mouth 2 (two) times daily. 03/31/22   Enedina Finner, MD  loratadine (CLARITIN) 10 MG tablet Take 1 tablet (10 mg total) by mouth daily as needed for allergies. 12/22/12   Eustaquio Boyden, MD  metoprolol tartrate (LOPRESSOR) 50 MG tablet Take 0.5 tablets (25 mg total) by mouth 2 (two) times daily. 10/16/20   Eustaquio Boyden, MD  nitroGLYCERIN (NITROSTAT) 0.4 MG SL tablet Place 1 tablet (0.4 mg total) under the tongue every 5 (five) minutes x 3 doses as needed for chest  pain. 03/31/22   Enedina Finner, MD  pantoprazole (PROTONIX) 40 MG tablet Take 40 mg by mouth 2 (two) times daily. 02/14/22 02/14/23  [provider]  polyethylene glycol powder (GLYCOLAX/MIRALAX) 17 GM/SCOOP powder Take 8.5-17 g by mouth daily as needed for moderate constipation. 12/13/20   Eustaquio Boyden, MD  primidone (MYSOLINE) 50 MG tablet Take 2 tablets (100 mg total) by mouth 3 (three) times daily. 10/16/20   Eustaquio Boyden, MD  rosuvastatin (CRESTOR) 20 MG tablet Take 20 mg by mouth at bedtime.    [provider]      VITAL SIGNS:  Blood pressure (!) 150/83, pulse 84, temperature 98.1 F (36.7 C), temperature source Oral, resp. rate 16, height 5\' 10"  (1.778 m), weight 83.5 kg, SpO2 94%.  PHYSICAL EXAMINATION:  Physical Exam  GENERAL:  89 y.o.-year-old Caucasian male patient lying in the bed with no acute distress.  EYES: Pupils equal, round, reactive to light and accommodation. No scleral icterus. Extraocular muscles intact.  HEENT: Head atraumatic, normocephalic. Oropharynx and nasopharynx clear.  NECK:  Supple, no jugular venous distention. No thyroid enlargement, no tenderness.  LUNGS: Normal breath sounds bilaterally, no wheezing, rales,rhonchi or crepitation. No use of accessory muscles of respiration.  CARDIOVASCULAR: Regular rate and rhythm, S1, S2 normal. No murmurs, rubs, or gallops.  ABDOMEN: Soft, nondistended, nontender. Bowel sounds present. No organomegaly or mass.  EXTREMITIES: No pedal edema, cyanosis, or clubbing.  NEUROLOGIC: Cranial nerves II through XII are intact. Muscle strength 5/5 in all extremities. Sensation intact. Gait not checked.  PSYCHIATRIC: The patient is alert and oriented x 3.  Normal affect and good eye contact. SKIN: No obvious rash, lesion, or ulcer.   LABORATORY PANEL:   CBC Recent Labs  Lab 12/09/22 1832  WBC 7.8  HGB 10.7*  HCT 31.8*  PLT 131*    ------------------------------------------------------------------------------------------------------------------  Chemistries  Recent Labs  Lab 12/09/22 1832  NA 135  K 4.3  CL 106  CO2 20*  GLUCOSE 179*  BUN 35*  CREATININE 1.79*  CALCIUM 8.7*  AST 15  ALT 8  ALKPHOS 53  BILITOT 0.7   ------------------------------------------------------------------------------------------------------------------  Cardiac Enzymes No results for input(s): "TROPONINI" in the last 168 hours. ------------------------------------------------------------------------------------------------------------------  RADIOLOGY:  DG Chest 2 View  Result Date: 12/09/2022 CLINICAL DATA:  chest  pain Pt was eating soup at 5p when he developed CP. Took two nitroglycerin without relief. Current pain is 8/10. Hx of two MIs EXAM: CHEST - 2 VIEW COMPARISON:  Chest x-ray 08/10/2022, CT chest 03/25/2022 FINDINGS: Slightly widened appearing mediastinum due to patient rotation. The heart and mediastinal contours are unchanged. Atherosclerotic plaque. Coronary artery stent noted. No focal consolidation. No pulmonary edema. Possible trace right pleural effusion. No pneumothorax. No acute osseous abnormality. IMPRESSION: Possible trace right pleural effusion. Electronically Signed   By: Tish Frederickson M.D.   On: 12/09/2022 21:23      IMPRESSION AND PLAN:  Assessment and Plan: * NSTEMI (non-ST elevated myocardial infarction) [facility]) - The patient will be admitted to an observation cardiac telemetry bed. - Will follow serial troponins and EKGs. - The patient will be continued on aspirin and Plavix, as well as p.r.n. sublingual nitroglycerin and morphine sulfate for pain. - We will continue Imdur. - We will continue the patient on IV heparin and high-dose statin - We will obtain a cardiology consult in a.m. and 2D echo. - Dr. Darrold Junker was notified about the patient.  HFrEF (heart failure with reduced ejection  fraction) (HCC) - We will continue Lasix.  Essential hypertension - We will continue antihypertensive therapy.  BPH (benign prostatic hyperplasia) - Will continue Proscar.  Dyslipidemia - We will continue statin therapy and check fasting lipids.   DVT prophylaxis: IV heparin Advanced Care Planning:  Code Status: full code. Family Communication:  The plan of care was discussed in details with the patient (and family). I answered all questions. The patient agreed to proceed with the above mentioned plan. Further management will depend upon hospital course. Disposition Plan: Back to previous home environment Consults called: Cardiology All the records are reviewed and case discussed with ED provider.  Status is: Observation  I certify that at the time of admission, it is my clinical judgment that the patient will require  hospital care extending less than 2 midnights.                            Dispo: The patient is from: Home              Anticipated d/c is to: Home              Patient currently is not medically stable to d/c.              Difficult to place patient: No  Hannah Beat M.D on 12/09/2022 at 9:58 PM  [facility]   From 7 PM-7 AM, contact night-coverage [email or web address]  CC: Primary care physician; Mick Sell, MD

## 2022-12-09 NOTE — Assessment & Plan Note (Signed)
-   We will continue anti-hypertensive therapy. 

## 2022-12-09 NOTE — Progress Notes (Signed)
PHARMACY - ANTICOAGULATION CONSULT NOTE  Pharmacy Consult for heparin infusion Indication: chest pain/ACS  Allergies  Allergen Reactions   Penicillins Swelling    Of tongue Has patient had a PCN reaction causing immediate rash, facial/tongue/throat swelling, SOB or lightheadedness with hypotension: Yes Has patient had a PCN reaction causing severe rash involving mucus membranes or skin necrosis: No Has patient had a PCN reaction that required hospitalization: No Has patient had a PCN reaction occurring within the last 10 years: No If all of the above answers are "NO", then may proceed with Cephalosporin use.   Cialis [Tadalafil] Other (See Comments)    Indigestion   Lipitor [Atorvastatin] Other (See Comments)    Severe Myalgias   Lisinopril Cough    Patient Measurements: Height: 5\' 10"  (177.8 cm) Weight: 83.5 kg (184 lb) IBW/kg (Calculated) : 73 Heparin Dosing Weight: 83.9 kg  Vital Signs: Temp: 98.1 F (36.7 C) (11/18 1830) Temp Source: Oral (11/18 1830) BP: 150/83 (11/18 1830) Pulse Rate: 84 (11/18 1830)  Labs: Recent Labs    12/09/22 1832 12/09/22 2021  HGB 10.7*  --   HCT 31.8*  --   PLT 131*  --   CREATININE 1.79*  --   TROPONINIHS 23* 172*    Estimated Creatinine Clearance: 27.2 mL/min (A) (by C-G formula based on SCr of 1.79 mg/dL (H)).   Medical History: Past Medical History:  Diagnosis Date   Abnormal drug screen 06/22/2014   inapprop neg xanax rpt 3 mo (06/2014)   BPH (benign prostatic hypertrophy) 01/21/1998   has had 3 biopsies in past ([facility]) decided to stop PSA/DRE   CHF (congestive heart failure) (HCC)    Coronary artery disease    Hyperlipidemia 01/21/2002   Hypertension 05/22/2003   Left lumbar radiculopathy    Osteoarthritis 01/22/1988   knees, lumbar spondylosis and listhesis    Assessment: 89 y.o. male with PMH of BPH, HTN, CHF, HLD CKD, coronary disease who comes in with concern for chest pain. A review of recent dispense history  reveals no chronic anticoagulation prior to arrival  Baseline Labs: ordered, pending  Goal of Therapy:  Heparin level 0.3-0.7 units/ml Monitor platelets by anticoagulation protocol: Yes   Plan:  Give 4000 units bolus x 1 Start heparin infusion at 1100 units/hr Check anti-Xa level in 8 hours and daily while on heparin Continue to monitor H&H and platelets  Lowella Bandy 12/09/2022,9:24 PM

## 2022-12-09 NOTE — Assessment & Plan Note (Addendum)
-   The patient will be admitted to an observation cardiac telemetry bed. - Will follow serial troponins and EKGs. - The patient will be continued on aspirin and Plavix, as well as p.r.n. sublingual nitroglycerin and morphine sulfate for pain. - We will continue Imdur. - We will continue the patient on IV heparin and high-dose statin - We will obtain a cardiology consult in a.m. and 2D echo. - Dr. Darrold Junker was notified about the patient.

## 2022-12-09 NOTE — Assessment & Plan Note (Addendum)
-   We will continue statin therapy and check fasting lipids. 

## 2022-12-09 NOTE — ED Triage Notes (Signed)
Pt was eating soup at 5p when he developed CP. Took two nitroglycerin without relief. Current pain is 8/10. Hx of two MIs

## 2022-12-09 NOTE — Assessment & Plan Note (Signed)
-   We will continue Lasix.

## 2022-12-09 NOTE — Assessment & Plan Note (Signed)
Will continue Proscar.

## 2022-12-09 NOTE — ED Provider Notes (Signed)
[facility] Provider Note    Event Date/Time   First MD Initiated Contact with Patient 12/09/22 1901     (approximate)   History   Chest Pain   HPI  Shawn Andrews is a 89 y.o. male with CKD, coronary disease who comes in with concern for chest pain.  Patient reports that around 5 PM he developed chest pain.  Patient states that initially he took some acid reducer and that did not help.  He then took 2 nitro's but that did not help either.  He denies any shortness of breath although does have a slight wheeze with talking but he denies feeling short of breath or being a smoker COPD here.  He denies any abdominal pain.  He reports that the pain is in the center of his chest.  Nonradiating.     Physical Exam   Triage Vital Signs: ED Triage Vitals  Encounter Vitals Group     BP 12/09/22 1830 (!) 150/83     Systolic BP Percentile --      Diastolic BP Percentile --      Pulse Rate 12/09/22 1830 84     Resp 12/09/22 1830 16     Temp 12/09/22 1830 98.1 F (36.7 C)     Temp Source 12/09/22 1830 Oral     SpO2 12/09/22 1830 94 %     Weight 12/09/22 1829 185 lb (83.9 kg)     Height 12/09/22 1829 5\' 10"  (1.778 m)     Head Circumference --      Peak Flow --      Pain Score 12/09/22 1829 8     Pain Loc --      Pain Education --      Exclude from Growth Chart --     Most recent vital signs: Vitals:   12/09/22 1830  BP: (!) 150/83  Pulse: 84  Resp: 16  Temp: 98.1 F (36.7 C)  SpO2: 94%     General: Awake, no distress.  CV:  Good peripheral perfusion.  Resp:  Normal effort.  Abd:  No distention.  Other:  No swelling in legs.   ED Results / Procedures / Treatments   Labs (all labs ordered are listed, but only abnormal results are displayed) Labs Reviewed  BASIC METABOLIC PANEL  CBC  TROPONIN I (HIGH SENSITIVITY)     EKG  My interpretation of EKG:  Normal sinus rhythm 92 with left bundle branch block, T wave version aVL, no ST  elevation that would meet Sgarbossa's  Repeat EKG is sinus rate of 70 with continued left bundle branch block but T wave versions in V4 and V5  RADIOLOGY I have reviewed the xray personally and interpreted and looks similar to prior   PROCEDURES:  Critical Care performed: Yes, see critical care procedure note(s)  .1-3 Lead EKG Interpretation  Performed by: Concha Se, MD Authorized by: Concha Se, MD     Interpretation: normal     ECG rate:  80   ECG rate assessment: normal     Rhythm: sinus rhythm     Ectopy: none     Conduction: normal   .Critical Care  Performed by: Concha Se, MD Authorized by: Concha Se, MD   Critical care provider statement:    Critical care time (minutes):  30   Critical care was necessary to treat or prevent imminent or life-threatening deterioration of the following conditions:  Cardiac failure  Critical care was time spent personally by me on the following activities:  Development of treatment plan with patient or surrogate, discussions with consultants, evaluation of patient's response to treatment, examination of patient, ordering and review of laboratory studies, ordering and review of radiographic studies, ordering and performing treatments and interventions, pulse oximetry, re-evaluation of patient's condition and review of old charts    MEDICATIONS ORDERED IN ED: Medications  furosemide (LASIX) tablet 20 mg (has no administration in time range)  hydrALAZINE (APRESOLINE) tablet 50 mg (has no administration in time range)  isosorbide mononitrate (IMDUR) 24 hr tablet 60 mg (has no administration in time range)  nitroGLYCERIN (NITROSTAT) SL tablet 0.4 mg (has no administration in time range)  metoprolol tartrate (LOPRESSOR) tablet 25 mg (has no administration in time range)  pantoprazole (PROTONIX) EC tablet 40 mg (has no administration in time range)  famotidine (PEPCID) tablet 20 mg (has no administration in time range)  docusate  sodium (COLACE) capsule 100 mg (has no administration in time range)  rosuvastatin (CRESTOR) tablet 20 mg (has no administration in time range)  finasteride (PROSCAR) tablet 5 mg (has no administration in time range)  clopidogrel (PLAVIX) tablet 75 mg (has no administration in time range)  cyanocobalamin (VITAMIN B12) tablet 1,000 mcg (has no administration in time range)  primidone (MYSOLINE) tablet 100 mg (has no administration in time range)  cholecalciferol (VITAMIN D3) 25 MCG (1000 UNIT) tablet 1,000 Units (has no administration in time range)  loratadine (CLARITIN) tablet 10 mg (has no administration in time range)  aspirin chewable tablet 324 mg (has no administration in time range)    Or  aspirin suppository 300 mg (has no administration in time range)  aspirin EC tablet 81 mg (has no administration in time range)  acetaminophen (TYLENOL) tablet 650 mg (has no administration in time range)  ondansetron (ZOFRAN) injection 4 mg (has no administration in time range)  0.9 %  sodium chloride infusion (has no administration in time range)  ALPRAZolam (XANAX) tablet 0.25 mg (has no administration in time range)  traZODone (DESYREL) tablet 25 mg (has no administration in time range)  magnesium hydroxide (MILK OF MAGNESIA) suspension 30 mL (has no administration in time range)  heparin bolus via infusion 4,000 Units (has no administration in time range)  heparin ADULT infusion 100 units/mL (25000 units/271mL) (has no administration in time range)  aspirin chewable tablet 324 mg (324 mg Oral Given 12/09/22 2045)  pantoprazole (PROTONIX) injection 40 mg (40 mg Intravenous Given 12/09/22 2044)  acetaminophen (TYLENOL) tablet 1,000 mg (1,000 mg Oral Given 12/09/22 2045)  alum & mag hydroxide-simeth (MAALOX/MYLANTA) 200-200-20 MG/5ML suspension 30 mL (30 mLs Oral Given 12/09/22 2050)  ipratropium-albuterol (DUONEB) 0.5-2.5 (3) MG/3ML nebulizer solution 3 mL (3 mLs Nebulization Given 12/09/22 2044)      IMPRESSION / MDM / ASSESSMENT AND PLAN / ED COURSE  I reviewed the triage vital signs and the nursing notes.   Patient's presentation is most consistent with acute presentation with potential threat to life or bodily function.   Patient comes in with concerns for chest pain.  Differential is ACS, GERD, reflux.  Patient given some symptomatic treatment.  Did have a little bit of wheezing but he denies feeling short of breath we will trial a DuoNeb.  EKG shows left bundle branch block that he had previously but his T waves are now upright in the V4 through V6 when they were previously down.  His second EKG had been flipping back down so comes  up current concern for some dynamic changes.  BMP shows stable creatinine.  CBC shows stable hemoglobin.  Initial troponin is slightly elevated at 23.  Lipase normal  Repeat troponin is uptrending  Reevaluated patient he denies any chest pain.  Given elevated troponin I have discussed with the patient admission for NSTEMI.  I have sent a message to Dr. Darrold Junker shows so he is aware and he stated that he would see the patient tomorrow.  I have admitted the patient to the hospitalist  The patient is on the cardiac monitor to evaluate for evidence of arrhythmia and/or significant heart rate changes.      FINAL CLINICAL IMPRESSION(S) / ED DIAGNOSES   Final diagnoses:  NSTEMI (non-ST elevated myocardial infarction) (HCC)     Rx / DC Orders   ED Discharge Orders     None        Note:  This document was prepared using Dragon voice recognition software and may include unintentional dictation errors.   Concha Se, MD 12/09/22 [phone]

## 2022-12-10 ENCOUNTER — Encounter: Payer: Self-pay | Admitting: Family Medicine

## 2022-12-10 ENCOUNTER — Observation Stay
Admit: 2022-12-10 | Discharge: 2022-12-10 | Disposition: A | Discharge status: Home / Self Care | Payer: Medicare Other | Attending: Student | Admitting: Student

## 2022-12-10 DIAGNOSIS — N400 Enlarged prostate without lower urinary tract symptoms: Secondary | ICD-10-CM | POA: Diagnosis present

## 2022-12-10 DIAGNOSIS — Z833 Family history of diabetes mellitus: Secondary | ICD-10-CM | POA: Diagnosis not present

## 2022-12-10 DIAGNOSIS — Z888 Allergy status to other drugs, medicaments and biological substances status: Secondary | ICD-10-CM | POA: Diagnosis not present

## 2022-12-10 DIAGNOSIS — D649 Anemia, unspecified: Secondary | ICD-10-CM | POA: Diagnosis present

## 2022-12-10 DIAGNOSIS — I214 Non-ST elevation (NSTEMI) myocardial infarction: Secondary | ICD-10-CM | POA: Diagnosis present

## 2022-12-10 DIAGNOSIS — Z1152 Encounter for screening for COVID-19: Secondary | ICD-10-CM | POA: Diagnosis not present

## 2022-12-10 DIAGNOSIS — Z7902 Long term (current) use of antithrombotics/antiplatelets: Secondary | ICD-10-CM | POA: Diagnosis not present

## 2022-12-10 DIAGNOSIS — E669 Obesity, unspecified: Secondary | ICD-10-CM | POA: Diagnosis present

## 2022-12-10 DIAGNOSIS — N1832 Chronic kidney disease, stage 3b: Secondary | ICD-10-CM | POA: Diagnosis present

## 2022-12-10 DIAGNOSIS — N390 Urinary tract infection, site not specified: Secondary | ICD-10-CM | POA: Diagnosis not present

## 2022-12-10 DIAGNOSIS — I130 Hypertensive heart and chronic kidney disease with heart failure and stage 1 through stage 4 chronic kidney disease, or unspecified chronic kidney disease: Secondary | ICD-10-CM | POA: Diagnosis present

## 2022-12-10 DIAGNOSIS — Z79899 Other long term (current) drug therapy: Secondary | ICD-10-CM | POA: Diagnosis not present

## 2022-12-10 DIAGNOSIS — Z8249 Family history of ischemic heart disease and other diseases of the circulatory system: Secondary | ICD-10-CM | POA: Diagnosis not present

## 2022-12-10 DIAGNOSIS — J9601 Acute respiratory failure with hypoxia: Secondary | ICD-10-CM | POA: Diagnosis not present

## 2022-12-10 DIAGNOSIS — I2510 Atherosclerotic heart disease of native coronary artery without angina pectoris: Secondary | ICD-10-CM | POA: Diagnosis present

## 2022-12-10 DIAGNOSIS — G250 Essential tremor: Secondary | ICD-10-CM | POA: Diagnosis present

## 2022-12-10 DIAGNOSIS — Z955 Presence of coronary angioplasty implant and graft: Secondary | ICD-10-CM | POA: Diagnosis not present

## 2022-12-10 DIAGNOSIS — Z880 Allergy status to penicillin: Secondary | ICD-10-CM | POA: Diagnosis not present

## 2022-12-10 DIAGNOSIS — R079 Chest pain, unspecified: Secondary | ICD-10-CM | POA: Diagnosis not present

## 2022-12-10 DIAGNOSIS — Z9359 Other cystostomy status: Secondary | ICD-10-CM | POA: Diagnosis not present

## 2022-12-10 DIAGNOSIS — Z96651 Presence of right artificial knee joint: Secondary | ICD-10-CM | POA: Diagnosis present

## 2022-12-10 DIAGNOSIS — Z87891 Personal history of nicotine dependence: Secondary | ICD-10-CM | POA: Diagnosis not present

## 2022-12-10 DIAGNOSIS — Z808 Family history of malignant neoplasm of other organs or systems: Secondary | ICD-10-CM | POA: Diagnosis not present

## 2022-12-10 DIAGNOSIS — E785 Hyperlipidemia, unspecified: Secondary | ICD-10-CM | POA: Diagnosis present

## 2022-12-10 DIAGNOSIS — I5022 Chronic systolic (congestive) heart failure: Secondary | ICD-10-CM | POA: Diagnosis present

## 2022-12-10 LAB — CBC
HCT: 24.9 % — ABNORMAL LOW (ref 39.0–52.0)
Hemoglobin: 8.4 g/dL — ABNORMAL LOW (ref 13.0–17.0)
MCH: 29.9 pg (ref 26.0–34.0)
MCHC: 33.7 g/dL (ref 30.0–36.0)
MCV: 88.6 fL (ref 80.0–100.0)
Platelets: 111 10*3/uL — ABNORMAL LOW (ref 150–400)
RBC: 2.81 MIL/uL — ABNORMAL LOW (ref 4.22–5.81)
RDW: 13.8 % (ref 11.5–15.5)
WBC: 4.8 10*3/uL (ref 4.0–10.5)
nRBC: 0 % (ref 0.0–0.2)

## 2022-12-10 LAB — HEPARIN LEVEL (UNFRACTIONATED)
Heparin Unfractionated: 0.47 [IU]/mL (ref 0.30–0.70)
Heparin Unfractionated: 0.56 [IU]/mL (ref 0.30–0.70)

## 2022-12-10 LAB — APTT: aPTT: 168 s (ref 24–36)

## 2022-12-10 LAB — TROPONIN I (HIGH SENSITIVITY)
Troponin I (High Sensitivity): 2561 ng/L (ref <18)
Troponin I (High Sensitivity): 2514 ng/L (ref <18)

## 2022-12-10 LAB — PROTIME-INR
INR: 1.3 — ABNORMAL HIGH (ref 0.8–1.2)
Prothrombin Time: 16.2 s — ABNORMAL HIGH (ref 11.4–15.2)

## 2022-12-10 MED ORDER — SODIUM CHLORIDE 0.9 % IV SOLN: 250.0000 mL | INTRAVENOUS | Status: DC | PRN

## 2022-12-10 MED ORDER — ASPIRIN 81 MG PO TBEC: 81.0000 mg | DELAYED_RELEASE_TABLET | Freq: Every day | ORAL | Status: DC

## 2022-12-10 MED ORDER — ASPIRIN 81 MG PO CHEW
81.0000 mg | CHEWABLE_TABLET | ORAL | Status: AC
  Administered 2022-12-11: 81 mg via ORAL
  Filled 2022-12-10: qty 1

## 2022-12-10 MED ORDER — SODIUM CHLORIDE 0.9% FLUSH: 3.0000 mL | INTRAVENOUS | Status: DC | PRN

## 2022-12-10 MED ORDER — SODIUM CHLORIDE 0.9 % IV SOLN: INTRAVENOUS | Status: DC

## 2022-12-10 MED ORDER — MELATONIN 5 MG PO TABS
10.0000 mg | ORAL_TABLET | Freq: Every evening | ORAL | Status: DC | PRN
  Administered 2022-12-11: 10 mg via ORAL
  Filled 2022-12-10: qty 2

## 2022-12-10 MED ORDER — SODIUM CHLORIDE 0.9% FLUSH
3.0000 mL | Freq: Two times a day (BID) | INTRAVENOUS | Status: DC
  Administered 2022-12-10 – 2022-12-11 (×2): 3 mL via INTRAVENOUS

## 2022-12-10 NOTE — Progress Notes (Signed)
PROGRESS NOTE    Shawn Andrews  ZOX:[id number] DOB: 29-May-1928 DOA: 12/09/2022 PCP: Mick Sell, MD  Outpatient Specialists: cardiology    Brief Narrative:   Shawn Andrews is a 89 y.o. Caucasian male with medical history significant for coronary artery disease, status post PCI 3 stents, CHF, hypertension, essential tremors, dyslipidemia and osteoarthritis, who presented to the emergency room with acute onset of chest pain that started around 5 PM.  Initially thought it was related to GERD and and took acid suppressant that did not help.  He later took 2 nitroglycerin tablets that did not help either.  He denied any associated nausea or vomiting or diaphoresis.  He denied any radiation to his pain.  No dyspnea or palpitations.  He is having mild wheezing though while talking.  No fever or chills.  No cough or hemoptysis.  No leg pain or edema or recent travels or surgeries.  No dysuria, oliguria or hematuria or flank pain.  No bleeding diathesis.    Assessment & Plan:   Principal Problem:   NSTEMI (non-ST elevated myocardial infarction) (HCC) Active Problems:   HFrEF (heart failure with reduced ejection fraction) (HCC)   Chronic kidney disease, stage 3b (HCC)   Suprapubic catheter (HCC)   Essential hypertension   Essential tremor   Leg DVT (deep venous thromboembolism), acute, left (HCC)   CAD (coronary artery disease)   Dyslipidemia   BPH (benign prostatic hyperplasia)  # NSTEMI Central non-radiating non-exertional chest pain yesterday that has resolved. Troponins have trended 23>72. No overt ischemic changes on EKG, nothing acute on cxr. Received aspirin. Currently feeling well. - cardiology following - trending troponins - we are continuing heparin for now - tentative plan for [facility] tomorrow - maintain tele  # CAD History 3 prior stents most recently 03/2022. - heparin as above - continue plavix, statin  # BPH - Cont home finasteride  # Suprapubic catheter 2/2  bph, chronic - catheter care. No s/s uti  # HFrEF Ef 35% last TTE. Appears euvolemic - cont home lasix, imdur  # HTN Normotensive - home hydral  # CKD 3b Gfr is at baseline - monitor  # Tremor - home primidone  DVT prophylaxis: iv heparin Code Status: full, confirmed with patient and wife at bedside Family Communication: wife updated @ bedside 11/193  Level of care: Progressive     Consultants:  cardiology  Procedures: pending  Antimicrobials:  none    Subjective: No chest pain or dyspnea or palpitations or other complaints  Objective: Vitals:   12/10/22 0415 12/10/22 0545 12/10/22 0700 12/10/22 0738  BP:   (!) 135/57   Pulse: 61 65 63   Resp: (!) 25 (!) 24 (!) 21   Temp:    98.1 F (36.7 C)  TempSrc:    Oral  SpO2: 93% 95% 96%   Weight:      Height:       No intake or output data in the 24 hours ending 12/10/22 0906 Filed Weights   12/09/22 1829 12/09/22 2055  Weight: 83.9 kg 83.5 kg    Examination:  General exam: Appears calm and comfortable  Respiratory system: Clear to auscultation. Respiratory effort normal. Cardiovascular system: S1 & S2 heard, RRR. Systolic murmur Gastrointestinal system: Abdomen is nondistended, soft and nontender. No organomegaly or masses felt. Suprapubic catheter in place Central nervous system: Alert and oriented. No focal neurological deficits. Extremities: Symmetric 5 x 5 power. No LE edema Skin: No rashes, lesions or ulcers Psychiatry: Judgement  and insight appear normal. Mood & affect appropriate.     Data Reviewed: I have personally reviewed following labs and imaging studies  CBC: Recent Labs  Lab 12/09/22 1832 12/10/22 0750  WBC 7.8 4.8  HGB 10.7* 8.4*  HCT 31.8* 24.9*  MCV 90.1 88.6  PLT 131* 111*   Basic Metabolic Panel: Recent Labs  Lab 12/09/22 1832  NA 135  K 4.3  CL 106  CO2 20*  GLUCOSE 179*  BUN 35*  CREATININE 1.79*  CALCIUM 8.7*   GFR: Estimated Creatinine Clearance: 27.2  mL/min (A) (by C-G formula based on SCr of 1.79 mg/dL (H)). Liver Function Tests: Recent Labs  Lab 12/09/22 1832  AST 15  ALT 8  ALKPHOS 53  BILITOT 0.7  PROT 6.5  ALBUMIN 4.2   Recent Labs  Lab 12/09/22 1832  LIPASE 27   No results for input(s): "AMMONIA" in the last 168 hours. Coagulation Profile: Recent Labs  Lab 12/10/22 0623  INR 1.3*   Cardiac Enzymes: No results for input(s): "CKTOTAL", "CKMB", "CKMBINDEX", "TROPONINI" in the last 168 hours. BNP (last 3 results) No results for input(s): "PROBNP" in the last 8760 hours. HbA1C: No results for input(s): "HGBA1C" in the last 72 hours. CBG: No results for input(s): "GLUCAP" in the last 168 hours. Lipid Profile: No results for input(s): "CHOL", "HDL", "LDLCALC", "TRIG", "CHOLHDL", "LDLDIRECT" in the last 72 hours. Thyroid Function Tests: No results for input(s): "TSH", "T4TOTAL", "FREET4", "T3FREE", "THYROIDAB" in the last 72 hours. Anemia Panel: No results for input(s): "VITAMINB12", "FOLATE", "FERRITIN", "TIBC", "IRON", "RETICCTPCT" in the last 72 hours. Urine analysis:    Component Value Date/Time   COLORURINE AMBER (A) 08/10/2022 0900   APPEARANCEUR Hazy (A) 08/20/2022 1350   LABSPEC 1.017 08/10/2022 0900   PHURINE 5.0 08/10/2022 0900   GLUCOSEU Negative 08/20/2022 1350   GLUCOSEU NEGATIVE 12/25/2020 1556   HGBUR SMALL (A) 08/10/2022 0900   BILIRUBINUR Negative 08/20/2022 1350   KETONESUR NEGATIVE 08/10/2022 0900   PROTEINUR Negative 08/20/2022 1350   PROTEINUR 100 (A) 08/10/2022 0900   UROBILINOGEN 0.2 12/25/2020 1556   NITRITE Negative 08/20/2022 1350   NITRITE POSITIVE (A) 08/10/2022 0900   LEUKOCYTESUR Negative 08/20/2022 1350   LEUKOCYTESUR MODERATE (A) 08/10/2022 0900   Sepsis Labs: @LABRCNTIP (procalcitonin:4,lacticidven:4)  ) Recent Results (from the past 240 hour(s))  Resp panel by RT-PCR (RSV, Flu A&B, Covid) Anterior Nasal Swab     Status: None   Collection Time: 12/09/22  8:15 PM    Specimen: Anterior Nasal Swab  Result Value Ref Range Status   SARS Coronavirus 2 by RT PCR NEGATIVE NEGATIVE Final    Comment: (NOTE) SARS-CoV-2 target nucleic acids are NOT DETECTED.  The SARS-CoV-2 RNA is generally detectable in upper respiratory specimens during the acute phase of infection. The lowest concentration of SARS-CoV-2 viral copies this assay can detect is 138 copies/mL. A negative result does not preclude SARS-Cov-2 infection and should not be used as the sole basis for treatment or other patient management decisions. A negative result may occur with  improper specimen collection/handling, submission of specimen other than nasopharyngeal swab, presence of viral mutation(s) within the areas targeted by this assay, and inadequate number of viral copies(<138 copies/mL). A negative result must be combined with clinical observations, patient history, and epidemiological information. The expected result is Negative.  Fact Sheet for Patients:  BloggerCourse.com  Fact Sheet for Healthcare Providers:  SeriousBroker.it  This test is no t yet approved or cleared by the [place] and  has been authorized for detection and/or diagnosis of SARS-CoV-2 by FDA under an Emergency Use Authorization (EUA). This EUA will remain  in effect (meaning this test can be used) for the duration of the COVID-19 declaration under Section 564(b)(1) of the Act, 21 U.S.C.section 360bbb-3(b)(1), unless the authorization is terminated  or revoked sooner.       Influenza A by PCR NEGATIVE NEGATIVE Final   Influenza B by PCR NEGATIVE NEGATIVE Final    Comment: (NOTE) The Xpert Xpress SARS-CoV-2/FLU/RSV plus assay is intended as an aid in the diagnosis of influenza from Nasopharyngeal swab specimens and should not be used as a sole basis for treatment. Nasal washings and aspirates are unacceptable for Xpert Xpress  SARS-CoV-2/FLU/RSV testing.  Fact Sheet for Patients: BloggerCourse.com  Fact Sheet for Healthcare Providers: SeriousBroker.it  This test is not yet approved or cleared by the [place] FDA and has been authorized for detection and/or diagnosis of SARS-CoV-2 by FDA under an Emergency Use Authorization (EUA). This EUA will remain in effect (meaning this test can be used) for the duration of the COVID-19 declaration under Section 564(b)(1) of the Act, 21 U.S.C. section 360bbb-3(b)(1), unless the authorization is terminated or revoked.     Resp Syncytial Virus by PCR NEGATIVE NEGATIVE Final    Comment: (NOTE) Fact Sheet for Patients: BloggerCourse.com  Fact Sheet for Healthcare Providers: SeriousBroker.it  This test is not yet approved or cleared by the [place] FDA and has been authorized for detection and/or diagnosis of SARS-CoV-2 by FDA under an Emergency Use Authorization (EUA). This EUA will remain in effect (meaning this test can be used) for the duration of the COVID-19 declaration under Section 564(b)(1) of the Act, 21 U.S.C. section 360bbb-3(b)(1), unless the authorization is terminated or revoked.  Performed at [facility], [address] [place], [place] [address]          Radiology Studies: DG Chest 2 View  Result Date: 12/09/2022 CLINICAL DATA:  chest pain Pt was eating soup at 5p when he developed CP. Took two nitroglycerin without relief. Current pain is 8/10. Hx of two MIs EXAM: CHEST - 2 VIEW COMPARISON:  Chest x-ray 08/10/2022, CT chest 03/25/2022 FINDINGS: Slightly widened appearing mediastinum due to patient rotation. The heart and mediastinal contours are unchanged. Atherosclerotic plaque. Coronary artery stent noted. No focal consolidation. No pulmonary edema. Possible trace right pleural effusion. No pneumothorax. No acute osseous  abnormality. IMPRESSION: Possible trace right pleural effusion. Electronically Signed   By: Tish Frederickson M.D.   On: 12/09/2022 21:23        Scheduled Meds:  aspirin  324 mg Oral NOW   Or   aspirin  300 mg Rectal NOW   aspirin EC  81 mg Oral Daily   cholecalciferol  1,000 Units Oral Daily   clopidogrel  75 mg Oral Daily   cyanocobalamin  1,000 mcg Oral Daily   docusate sodium  100 mg Oral Daily   famotidine  20 mg Oral QHS   finasteride  5 mg Oral Daily   [START ON 12/11/2022] furosemide  20 mg Oral QODAY   hydrALAZINE  50 mg Oral Q8H   isosorbide mononitrate  60 mg Oral BID   metoprolol tartrate  25 mg Oral BID   pantoprazole  40 mg Oral BID   primidone  100 mg Oral TID   rosuvastatin  20 mg Oral QHS   Continuous Infusions:  sodium chloride 40 mL/hr at 12/09/22 2303   heparin 1,100 Units/hr (12/09/22  2318)     LOS: 0 days     Silvano Bilis, MD [facility]   If 7PM-7AM, please contact night-coverage [email or web address] Password [email or web address] 12/10/2022, 9:06 AM

## 2022-12-10 NOTE — Consult Note (Signed)
[facility], [place] CLINIC CARDIOLOGY CONSULT NOTE       Patient ID: Shawn Andrews MRN: [id number] DOB/AGE: Oct 21, 1928 89 y.o.  Admit date: 12/09/2022 Referring Physician Dr. Artis Delay Primary Physician Sampson Goon Stann Mainland, MD Primary Cardiologist Dwayne Reason for Consultation ACS/NSTEMI  HPI: Shawn Andrews is a 89 y.o. male  with a past medical history of coronary artery disease, NSTEMI s/p two overlapping DES stent to proximal/mid LAD (09/2020), stent to proximal RCA and stent to in-stent restenosis of mid LAD (03/2022), chronic HFrEF (EF 30-35% 03/3022), hypertension, hyperlipidemia, DVT, CKD stage 3a, BPH who presented to the ED on 12/09/2022 for chest pain. Cardiology was consulted for further evaluation.   Patient states while eating dinner and watching TV last night he developed chest pain at rest. Patient took an acid reducer with no relief, then took 2 doses of nitroglycerin with minimal relief. Due to persistent chest pain, patient came to ED for evaluation. Patient reports this episode of chest pain felt similar to his prior episodes when he required stents. Patient described his chest pain as "sharp" and diffuse across his chest. Patient denies shortness of breath, palpitations or lower extremity edema.   Work up in the ED notable for Na 135, K 4.3, Cr 1.79, BUN 35, Hgb 10.7, platelets 131. Troponin elevated and trending 23 > 172 > 2,561. EKG in ED showed NSR with non specific ST-T changes, rate 92 bpm. ED triage BP 150/83 and afebrile. CXR showed mild right sided pleural effusion. Yesterday patient received ASA 324 mg, Imdur 60 mg, and started on heparin gtt.  At the time of my evaluation this morning, patient was laying in hospital bed at an incline with family at bedside. Discussed chest pain episode yesterday in detail. Patient reports that he's been working out at the [employer] two times a week for 1 hour with no exertional SOB or chest pain. Currently, patient states he "feels great" and  slept well last night. Patient denies any recurrence of chest pain or shortness of breath.   Review of systems complete and found to be negative unless listed above    Past Medical History:  Diagnosis Date   Abnormal drug screen 06/22/2014   inapprop neg xanax rpt 3 mo (06/2014)   BPH (benign prostatic hypertrophy) 01/21/1998   has had 3 biopsies in past ([facility]) decided to stop PSA/DRE   CHF (congestive heart failure) (HCC)    Coronary artery disease    Hyperlipidemia 01/21/2002   Hypertension 05/22/2003   Left lumbar radiculopathy    Osteoarthritis 01/22/1988   knees, lumbar spondylosis and listhesis    Past Surgical History:  Procedure Laterality Date   CATARACT EXTRACTION  2013   bilateral   CORONARY ANGIOGRAPHY N/A 03/29/2022   Procedure: CORONARY ANGIOGRAPHY;  Surgeon: Alwyn Pea, MD;  Location: [facility] INVASIVE CV LAB;  Service: Cardiovascular;  Laterality: N/A;   CORONARY STENT INTERVENTION N/A 03/27/2022   Procedure: CORONARY STENT INTERVENTION;  Surgeon: Alwyn Pea, MD;  Location: [facility] INVASIVE CV LAB;  Service: Cardiovascular;  Laterality: N/A;   CORONARY STENT INTERVENTION N/A 03/29/2022   Procedure: CORONARY STENT INTERVENTION;  Surgeon: Alwyn Pea, MD;  Location: [facility] INVASIVE CV LAB;  Service: Cardiovascular;  Laterality: N/A;   ESI Left 08/2014, 11/2014, 08/2015   L S1, L L5/S1 transforaminal ESI; L4/5 L5/S1 zygapophysial injections, L S1 transforaminal ([facility])   FINGER SURGERY     right middle, partial traumatic amputation   IR CATHETER TUBE CHANGE  03/02/2021   KNEE  ARTHROSCOPY  1990   right   KNEE SURGERY  04/2006   right partial knee replacement in [place] - rec ppx abx for any invasive procedure   LEFT HEART CATH AND CORONARY ANGIOGRAPHY N/A 01/18/2021   Procedure: LEFT HEART CATH AND CORONARY ANGIOGRAPHY;  Surgeon: Lamar Blinks, MD;  Location: [facility] INVASIVE CV LAB;  Service: Cardiovascular;  Laterality: N/A;   LEFT HEART CATH AND  CORONARY ANGIOGRAPHY N/A 03/27/2022   Procedure: LEFT HEART CATH AND CORONARY ANGIOGRAPHY;  Surgeon: Alwyn Pea, MD;  Location: [facility] INVASIVE CV LAB;  Service: Cardiovascular;  Laterality: N/A;    (Not in a hospital admission)  Social History   Socioeconomic History   Marital status: Married    Spouse name: Not on file   Number of children: Not on file   Years of education: Not on file   Highest education level: Not on file  Occupational History   Occupation: retired Patent examiner, Network engineer, owns lawn care business  Tobacco Use   Smoking status: Former    Current packs/day: 0.00    Types: Pipe, Cigarettes    Quit date: 12/26/2000    Years since quitting: 21.9    Passive exposure: Past   Smokeless tobacco: Current    Types: Chew   Tobacco comments:    quit over 30 years ago  Vaping Use   Vaping status: Never Used  Substance and Sexual Activity   Alcohol use: Yes    Alcohol/week: 0.0 standard drinks of alcohol    Comment: Occasional beer   Drug use: No   Sexual activity: Not Currently  Other Topics Concern   Not on file  Social History Narrative   Widowed, remarried 09-Aug-2007.   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 [employer]    Activity: walks 1.5 miles 3d/wk   Diet: good water, fruits/vegetables daily   Social Determinants of Health   Financial Resource Strain: Low Risk  (12/06/2022)   Received from [facility]   Overall Financial Resource Strain (CARDIA)    Difficulty of Paying Living Expenses: Not hard at all  Food Insecurity: No Food Insecurity (12/10/2022)   Hunger Vital Sign    Worried About Running Out of Food in the Last Year: Never true    Ran Out of Food in the Last Year: Never true  Transportation Needs: No Transportation Needs (12/10/2022)   PRAPARE - Administrator, Civil Service (Medical): No    Lack of Transportation  (Non-Medical): No  Physical Activity: Not on file  Stress: Not on file  Social Connections: Not on file  Intimate Partner Violence: Not At Risk (12/10/2022)   Humiliation, Afraid, Rape, and Kick questionnaire    Fear of Current or Ex-Partner: No    Emotionally Abused: No    Physically Abused: No    Sexually Abused: No    Family History  Problem Relation Age of Onset   Hypertension Mother    Heart disease Brother 34       MI   Diabetes Brother    Cancer Daughter        melanoma   Stroke Neg Hx      Vitals:   12/10/22 0545 12/10/22 0700 12/10/22 0738 12/10/22 1000  BP:  (!) 135/57  (!) 129/47  Pulse: 65 63  61  Resp: (!) 24 (!) 21  (!) 22  Temp:   98.1 F (36.7 C)  TempSrc:   Oral   SpO2: 95% 96%  96%  Weight:      Height:        PHYSICAL EXAM General: Well-appearing elderly male, well nourished, in no acute distress, laying in hospital bed at an incline. HEENT: Normocephalic and atraumatic. Neck: No JVD.   Lungs: Normal respiratory effort on room air. Clear bilaterally to auscultation. No wheezes, crackles, rhonchi.  Heart: HRRR. Normal S1 and S2 without gallops or murmurs.  Abdomen: Non-distended appearing.  Msk: Normal strength and tone for age. Extremities: Warm and well perfused. No clubbing, cyanosis. No edema.  Neuro: Alert and oriented X 3. Psych: Answers questions appropriately.   Labs: Basic Metabolic Panel: Recent Labs    12/09/22 1832  NA 135  K 4.3  CL 106  CO2 20*  GLUCOSE 179*  BUN 35*  CREATININE 1.79*  CALCIUM 8.7*   Liver Function Tests: Recent Labs    12/09/22 1832  AST 15  ALT 8  ALKPHOS 53  BILITOT 0.7  PROT 6.5  ALBUMIN 4.2   Recent Labs    12/09/22 1832  LIPASE 27   CBC: Recent Labs    12/09/22 1832 12/10/22 0750  WBC 7.8 4.8  HGB 10.7* 8.4*  HCT 31.8* 24.9*  MCV 90.1 88.6  PLT 131* 111*   Cardiac Enzymes: Recent Labs    12/09/22 1832 12/09/22 2021 12/10/22 0750  TROPONINIHS 23* 172* 2,561*    BNP: No results for input(s): "BNP" in the last 72 hours. D-Dimer: No results for input(s): "DDIMER" in the last 72 hours. Hemoglobin A1C: No results for input(s): "HGBA1C" in the last 72 hours. Fasting Lipid Panel: No results for input(s): "CHOL", "HDL", "LDLCALC", "TRIG", "CHOLHDL", "LDLDIRECT" in the last 72 hours. Thyroid Function Tests: No results for input(s): "TSH", "T4TOTAL", "T3FREE", "THYROIDAB" in the last 72 hours.  Invalid input(s): "FREET3" Anemia Panel: No results for input(s): "VITAMINB12", "FOLATE", "FERRITIN", "TIBC", "IRON", "RETICCTPCT" in the last 72 hours.   Radiology: DG Chest 2 View  Result Date: 12/09/2022 CLINICAL DATA:  chest pain Pt was eating soup at 5p when he developed CP. Took two nitroglycerin without relief. Current pain is 8/10. Hx of two MIs EXAM: CHEST - 2 VIEW COMPARISON:  Chest x-ray 08/10/2022, CT chest 03/25/2022 FINDINGS: Slightly widened appearing mediastinum due to patient rotation. The heart and mediastinal contours are unchanged. Atherosclerotic plaque. Coronary artery stent noted. No focal consolidation. No pulmonary edema. Possible trace right pleural effusion. No pneumothorax. No acute osseous abnormality. IMPRESSION: Possible trace right pleural effusion. Electronically Signed   By: Tish Frederickson M.D.   On: 12/09/2022 21:23    NM Myocardial Perfusion SPECT multiple: 01/08/2022 Abnormal myocardial perfusion scan left ventricular enlargement globally depressed ejection fraction of 35% there is a moderate to large area of anterior apical persistent defect consistent with scar there is also RV uptake consistent with systolic cardiomyopathy.  No clear evidence of reversible ischemia. Conclusion this is a high risk study   Most recent cardiac caths:  03/27/2022   Mid LAD lesion is 95% stenosed.   Prox RCA lesion is 75% stenosed.   Dist LM to Ost LAD lesion is 60% stenosed.   Dist LAD lesion is 50% stenosed.   A drug-eluting stent was  successfully placed using a STENT ONYX FRONTIER 3.5X15.   Post intervention, there is a 0% residual stenosis.   There is moderate left ventricular systolic dysfunction.   LV end diastolic pressure is mildly elevated.   The left ventricular ejection fraction  is 35-45% by visual estimate.  03/29/2022   Mid LAD lesion is 95% stenosed.   Dist LAD lesion is 50% stenosed.   Dist LM to Ost LAD lesion is 60% stenosed.   Non-stenotic Prox RCA lesion was previously treated.   A stent was successfully placed.   Post intervention, there is a 0% residual stenosis.  ECHO 03/26/2022  1. Ant/Apical hypo.   2. Left ventricular ejection fraction, by estimation, is 30 to 35%. The left ventricle has moderately decreased function. The left ventricle demonstrates regional wall motion abnormalities (see scoring diagram/findings for description). The left ventricular internal cavity size was moderately dilated. There is mild concentric left ventricular hypertrophy. Left ventricular diastolic parameters are consistent with Grade I diastolic dysfunction (impaired  relaxation).   3. Right ventricular systolic function is low normal. The right ventricular size is mildly enlarged. Mildly increased right ventricular wall thickness. There is severely elevated pulmonary artery systolic pressure.   4. Left atrial size was moderately dilated.   5. Right atrial size was mildly dilated.   6. The mitral valve is normal in structure. Mild mitral valve regurgitation.   7. Tricuspid valve regurgitation is mild to moderate.   8. The aortic valve is grossly normal. Aortic valve regurgitation is not visualized.   TELEMETRY reviewed by me 12/10/2022: Sinus rhythm, rate 60s  EKG reviewed by me: NSR with non specific ST-T changes, rate 92 bpm.   Data reviewed by me 12/10/2022: last 24h vitals tele labs imaging I/O ED provider note, admission H&P.  Principal Problem:   NSTEMI (non-ST elevated myocardial infarction) (HCC) Active  Problems:   Essential hypertension   Essential tremor   Chronic kidney disease, stage 3b (HCC)   Leg DVT (deep venous thromboembolism), acute, left (HCC)   CAD (coronary artery disease)   HFrEF (heart failure with reduced ejection fraction) (HCC)   Suprapubic catheter (HCC)   Dyslipidemia   BPH (benign prostatic hyperplasia)    ASSESSMENT AND PLAN:  Shawn Andrews is a 89 y.o. male  with a past medical history of coronary artery disease, NSTEMI s/p two overlapping DES stent to proximal/mid LAD (09/2020), stent to proximal RCA and stent to in-stent restenosis of mid LAD (03/2022), chronic HFrEF (EF 30-35% 03/3022), hypertension, hyperlipidemia, DVT, CKD stage 3a, BPH who presented to the ED on 12/09/2022 for chest pain. Cardiology was consulted for further evaluation.   # NSTEMI, type 1 # Coronary artery disease s/p 3 stents (mid/proximal LAD, proximal RCA) # Chronic HFrEF (EF 30-35% - 03/2022) Patient presents to ED with "sharp" diffuse chest pain across chest that occurred at rest with no associated shortness of breath. Troponin elevated and trending 23 > 172 > 2,561. EKG in ED showed NSR with non specific ST-T changes, rate 92 bpm. S/p ASA 324 mg. Patient started on heparin gtt yesterday. This morning patient states chest pain has completely resolved and has no other symptoms. -Continue to trend troponin. -Continue heparin gtt. Continue aspirin 81 mg daily and plavix 75 mg daily. -Continue home Imdur 60 mg BID, metoprolol 25 mg BID, hydralazine 50 mg q8hrs. -Plan for cardiac catheterization tomorrow at 12:30 pm with Dr. Darrold Junker for further evaluation.  -Plan to restart home lasix 20 mg every other day after cath tomorrow.   #Hypertension # Hyperlipidemia -Continue home rosuvastatin 20 mg daily.  -Restart home BP medications as above.   #CKD stage 3a # Anemia Upon arrival to ED, Cr 1.79 (at baseline) with stable Na and K. Hgb dropped from 10.7  to 8.4.  -Continue to monitor renal  function.  -Recheck and monitor Hgb.    This patient's plan of care was discussed and created with Dr. Darrold Junker and he is in agreement.  Signed: Gale Journey, PA-C  12/10/2022, 12:11 PM [facility]

## 2022-12-10 NOTE — Progress Notes (Signed)
PHARMACY - ANTICOAGULATION CONSULT NOTE  Pharmacy Consult for heparin infusion Indication: chest pain/ACS  Allergies  Allergen Reactions   Penicillins Swelling    Of tongue Has patient had a PCN reaction causing immediate rash, facial/tongue/throat swelling, SOB or lightheadedness with hypotension: Yes Has patient had a PCN reaction causing severe rash involving mucus membranes or skin necrosis: No Has patient had a PCN reaction that required hospitalization: No Has patient had a PCN reaction occurring within the last 10 years: No If all of the above answers are "NO", then may proceed with Cephalosporin use.   Cialis [Tadalafil] Other (See Comments)    Indigestion   Lipitor [Atorvastatin] Other (See Comments)    Severe Myalgias   Lisinopril Cough    Patient Measurements: Height: 5\' 10"  (177.8 cm) Weight: 83.5 kg (184 lb) IBW/kg (Calculated) : 73 Heparin Dosing Weight: 83.9 kg  Vital Signs: Temp: 98.1 F (36.7 C) (11/19 0003) Temp Source: Oral (11/19 0003) BP: 135/57 (11/19 0700) Pulse Rate: 63 (11/19 0700)  Labs: Recent Labs    12/09/22 1832 12/09/22 2021 12/10/22 0623 12/10/22 0750  HGB 10.7*  --   --  8.4*  HCT 31.8*  --   --  24.9*  PLT 131*  --   --  111*  APTT  --   --  168*  --   LABPROT  --   --  16.2*  --   INR  --   --  1.3*  --   HEPARINUNFRC  --   --   --  0.47  CREATININE 1.79*  --   --   --   TROPONINIHS 23* 172*  --   --     Estimated Creatinine Clearance: 27.2 mL/min (A) (by C-G formula based on SCr of 1.79 mg/dL (H)).   Medical History: Past Medical History:  Diagnosis Date   Abnormal drug screen 06/22/2014   inapprop neg xanax rpt 3 mo (06/2014)   BPH (benign prostatic hypertrophy) 01/21/1998   has had 3 biopsies in past ([facility]) decided to stop PSA/DRE   CHF (congestive heart failure) ([facility])    Coronary artery disease    Hyperlipidemia 01/21/2002   Hypertension 05/22/2003   Left lumbar radiculopathy    Osteoarthritis 01/22/1988    knees, lumbar spondylosis and listhesis    Assessment: 89 y.o. male with PMH of BPH, HTN, CHF, HLD CKD, coronary disease who comes in with concern for chest pain. A review of recent dispense history reveals no chronic anticoagulation prior to arrival  Baseline Labs: aPTT 168 sec(believed to be pulled after heparin was started, no signs/concerns for bleeding noted), INR 1.3, Hgb 10.7, Plts 131(baseline in the low 100s)  Goal of Therapy:  Heparin level 0.3-0.7 units/ml Monitor platelets by anticoagulation protocol: Yes   Plan:  11/19@0750 : HL 0.47, therapeutic x 1 Continue heparin infusion at 1100 units/hr Check confirmatory anti-Xa level in 8 hours and daily while on heparin Continue to monitor H&H and platelets(trending down, on lower end of baseline)  Bettey Costa, PharmD Clinical Pharmacist 12/10/2022 8:40 AM

## 2022-12-10 NOTE — Progress Notes (Signed)
PHARMACY - ANTICOAGULATION CONSULT NOTE  Pharmacy Consult for heparin infusion Indication: chest pain/ACS  Allergies  Allergen Reactions   Penicillins Swelling    Of tongue Has patient had a PCN reaction causing immediate rash, facial/tongue/throat swelling, SOB or lightheadedness with hypotension: Yes Has patient had a PCN reaction causing severe rash involving mucus membranes or skin necrosis: No Has patient had a PCN reaction that required hospitalization: No Has patient had a PCN reaction occurring within the last 10 years: No If all of the above answers are "NO", then may proceed with Cephalosporin use.   Cialis [Tadalafil] Other (See Comments)    Indigestion   Lipitor [Atorvastatin] Other (See Comments)    Severe Myalgias   Lisinopril Cough    Patient Measurements: Height: 5\' 10"  (177.8 cm) Weight: 82.1 kg (181 lb 1.6 oz) IBW/kg (Calculated) : 73 Heparin Dosing Weight: 83.9 kg  Vital Signs: Temp: 97.9 F (36.6 C) (11/19 1544) Temp Source: Oral (11/19 1544) BP: 136/61 (11/19 1544) Pulse Rate: 60 (11/19 1544)  Labs: Recent Labs    12/09/22 1832 12/09/22 2021 12/10/22 0623 12/10/22 0750 12/10/22 1400 12/10/22 1547  HGB 10.7*  --   --  8.4*  --   --   HCT 31.8*  --   --  24.9*  --   --   PLT 131*  --   --  111*  --   --   APTT  --   --  168*  --   --   --   LABPROT  --   --  16.2*  --   --   --   INR  --   --  1.3*  --   --   --   HEPARINUNFRC  --   --   --  0.47  --  0.56  CREATININE 1.79*  --   --   --   --   --   TROPONINIHS 23* 172*  --  2,561* 2,514*  --     Estimated Creatinine Clearance: 27.2 mL/min (A) (by C-G formula based on SCr of 1.79 mg/dL (H)).   Medical History: Past Medical History:  Diagnosis Date   Abnormal drug screen 06/22/2014   inapprop neg xanax rpt 3 mo (06/2014)   BPH (benign prostatic hypertrophy) 01/21/1998   has had 3 biopsies in past ([facility]) decided to stop PSA/DRE   CHF (congestive heart failure) (HCC)    Coronary  artery disease    Hyperlipidemia 01/21/2002   Hypertension 05/22/2003   Left lumbar radiculopathy    Osteoarthritis 01/22/1988   knees, lumbar spondylosis and listhesis    Assessment: 89 y.o. male with PMH of BPH, HTN, CHF, HLD CKD, coronary disease who comes in with concern for chest pain. A review of recent dispense history reveals no chronic anticoagulation prior to arrival  Baseline Labs: aPTT 168 sec(believed to be pulled after heparin was started, no signs/concerns for bleeding noted), INR 1.3, Hgb 10.7, Plts 131(baseline in the low 100s)  11/19 0750: HL 0.47, therapeutic x 1 11/19 1547 HL 0.56  Therapeutic x2   Goal of Therapy:  Heparin level 0.3-0.7 units/ml Monitor platelets by anticoagulation protocol: Yes   Plan:  11/19 1547 HL 0.56  Therapeutic x2 Continue heparin infusion at 1100 units/hr Check anti-Xa level with am labs and daily while on heparin Continue to monitor H&H and platelets(trending down, on lower end of baseline)  Angelique Blonder, PharmD Clinical Pharmacist 12/10/2022 4:24 PM

## 2022-12-10 NOTE — ED Notes (Signed)
Pt has suprapubic catheter placed prior to arriving in room 32 site appears cdi approx yellow urine output into urinal pt able to stand without assistance 20g piv to lac infusing heparin gtt100units/hr appears cdi secured not infiltrated @ this time pt provided blanket spouse provided blanket and water pt remains aox4 denies further needs @ this time

## 2022-12-11 ENCOUNTER — Encounter
Admission: EM | Disposition: A | Discharge status: Home / Self Care | Payer: Self-pay | Source: Home / Self Care | Attending: Obstetrics and Gynecology

## 2022-12-11 DIAGNOSIS — I214 Non-ST elevation (NSTEMI) myocardial infarction: Secondary | ICD-10-CM | POA: Diagnosis not present

## 2022-12-11 DIAGNOSIS — R079 Chest pain, unspecified: Secondary | ICD-10-CM | POA: Diagnosis not present

## 2022-12-11 HISTORY — PX: LEFT HEART CATH AND CORONARY ANGIOGRAPHY: CATH118249

## 2022-12-11 LAB — BASIC METABOLIC PANEL
Anion gap: 6 (ref 5–15)
BUN: 33 mg/dL — ABNORMAL HIGH (ref 8–23)
CO2: 22 mmol/L (ref 22–32)
Calcium: 8.6 mg/dL — ABNORMAL LOW (ref 8.9–10.3)
Chloride: 110 mmol/L (ref 98–111)
Creatinine, Ser: 2.06 mg/dL — ABNORMAL HIGH (ref 0.61–1.24)
GFR, Estimated: 30 mL/min — ABNORMAL LOW (ref >60)
Glucose, Bld: 96 mg/dL (ref 70–99)
Potassium: 4.6 mmol/L (ref 3.5–5.1)
Sodium: 138 mmol/L (ref 135–145)

## 2022-12-11 LAB — ECHOCARDIOGRAM COMPLETE
AR max vel: 2.67 cm2
AV Area VTI: 2.99 cm2
AV Area mean vel: 2.75 cm2
AV Mean grad: 3 mm[Hg]
AV Peak grad: 5.5 mm[Hg]
Ao pk vel: 1.17 m/s
Area-P 1/2: 3.45 cm2
Calc EF: 40.9 %
Height: 70 in
MV VTI: 2.09 cm2
S' Lateral: 3.8 cm
Single Plane A2C EF: 38.3 %
Single Plane A4C EF: 41.7 %
Weight: 2944 [oz_av]

## 2022-12-11 LAB — CBC
HCT: 25.6 % — ABNORMAL LOW (ref 39.0–52.0)
Hemoglobin: 8.8 g/dL — ABNORMAL LOW (ref 13.0–17.0)
MCH: 29.5 pg (ref 26.0–34.0)
MCHC: 34.4 g/dL (ref 30.0–36.0)
MCV: 85.9 fL (ref 80.0–100.0)
Platelets: 116 10*3/uL — ABNORMAL LOW (ref 150–400)
RBC: 2.98 MIL/uL — ABNORMAL LOW (ref 4.22–5.81)
RDW: 13.9 % (ref 11.5–15.5)
WBC: 5.8 10*3/uL (ref 4.0–10.5)
nRBC: 0 % (ref 0.0–0.2)

## 2022-12-11 LAB — HEPARIN LEVEL (UNFRACTIONATED): Heparin Unfractionated: 0.54 [IU]/mL (ref 0.30–0.70)

## 2022-12-11 SURGERY — LEFT HEART CATH AND CORONARY ANGIOGRAPHY
Anesthesia: Moderate Sedation

## 2022-12-11 MED ORDER — MIDAZOLAM HCL 2 MG/2ML IJ SOLN
INTRAMUSCULAR | Status: AC
  Filled 2022-12-11: qty 2

## 2022-12-11 MED ORDER — VERAPAMIL HCL 2.5 MG/ML IV SOLN
INTRAVENOUS | Status: AC
  Filled 2022-12-11: qty 2

## 2022-12-11 MED ORDER — METOPROLOL SUCCINATE ER 25 MG PO TB24
25.0000 mg | ORAL_TABLET | Freq: Two times a day (BID) | ORAL | Status: DC
  Administered 2022-12-11 – 2022-12-13 (×5): 25 mg via ORAL
  Filled 2022-12-11 (×5): qty 1

## 2022-12-11 MED ORDER — IOHEXOL 300 MG/ML  SOLN
INTRAMUSCULAR | Status: DC | PRN
  Administered 2022-12-11: 76 mL

## 2022-12-11 MED ORDER — LIDOCAINE HCL (PF) 1 % IJ SOLN
INTRAMUSCULAR | Status: DC | PRN
  Administered 2022-12-11: 5 mL

## 2022-12-11 MED ORDER — SODIUM CHLORIDE 0.9 % IV SOLN: INTRAVENOUS | Status: DC

## 2022-12-11 MED ORDER — FUROSEMIDE 20 MG PO TABS
20.0000 mg | ORAL_TABLET | ORAL | Status: DC
  Administered 2022-12-12: 20 mg via ORAL
  Filled 2022-12-11: qty 1

## 2022-12-11 MED ORDER — HEPARIN (PORCINE) IN NACL 1000-0.9 UT/500ML-% IV SOLN
INTRAVENOUS | Status: AC
  Filled 2022-12-11: qty 1000

## 2022-12-11 MED ORDER — ALUM & MAG HYDROXIDE-SIMETH 200-200-20 MG/5ML PO SUSP
30.0000 mL | ORAL | Status: DC | PRN
  Administered 2022-12-11: 30 mL via ORAL
  Filled 2022-12-11: qty 30

## 2022-12-11 MED ORDER — FENTANYL CITRATE (PF) 100 MCG/2ML IJ SOLN
INTRAMUSCULAR | Status: AC
  Filled 2022-12-11: qty 2

## 2022-12-11 MED ORDER — SODIUM CHLORIDE 0.9 % IV SOLN: 250.0000 mL | INTRAVENOUS | Status: AC | PRN

## 2022-12-11 MED ORDER — HEPARIN SODIUM (PORCINE) 5000 UNIT/ML IJ SOLN
5000.0000 [IU] | Freq: Three times a day (TID) | INTRAMUSCULAR | Status: DC
  Administered 2022-12-11 – 2022-12-13 (×5): 5000 [IU] via SUBCUTANEOUS
  Filled 2022-12-11 (×5): qty 1

## 2022-12-11 MED ORDER — HEPARIN SODIUM (PORCINE) 1000 UNIT/ML IJ SOLN
INTRAMUSCULAR | Status: DC | PRN
  Administered 2022-12-11: 4000 [IU] via INTRAVENOUS

## 2022-12-11 MED ORDER — MIDAZOLAM HCL 2 MG/2ML IJ SOLN
INTRAMUSCULAR | Status: DC | PRN
  Administered 2022-12-11: 1 mg via INTRAVENOUS

## 2022-12-11 MED ORDER — HEPARIN SODIUM (PORCINE) 1000 UNIT/ML IJ SOLN
INTRAMUSCULAR | Status: AC
  Filled 2022-12-11: qty 10

## 2022-12-11 MED ORDER — HYDRALAZINE HCL 20 MG/ML IJ SOLN
INTRAMUSCULAR | Status: AC
  Filled 2022-12-11: qty 1

## 2022-12-11 MED ORDER — LABETALOL HCL 5 MG/ML IV SOLN
10.0000 mg | INTRAVENOUS | Status: AC | PRN
Start: 2022-12-11 — End: 2022-12-11

## 2022-12-11 MED ORDER — FENTANYL CITRATE (PF) 100 MCG/2ML IJ SOLN
INTRAMUSCULAR | Status: DC | PRN
  Administered 2022-12-11: 25 ug via INTRAVENOUS

## 2022-12-11 MED ORDER — HEPARIN (PORCINE) IN NACL 2000-0.9 UNIT/L-% IV SOLN
INTRAVENOUS | Status: DC | PRN
  Administered 2022-12-11: 1000 mL

## 2022-12-11 MED ORDER — HYDRALAZINE HCL 20 MG/ML IJ SOLN
10.0000 mg | INTRAMUSCULAR | Status: AC | PRN
  Administered 2022-12-11: 10 mg via INTRAVENOUS

## 2022-12-11 MED ORDER — VERAPAMIL HCL 2.5 MG/ML IV SOLN
INTRAVENOUS | Status: DC | PRN
  Administered 2022-12-11: 2.5 mg via INTRAVENOUS

## 2022-12-11 MED ORDER — SODIUM CHLORIDE 0.9 % WEIGHT BASED INFUSION: 1.0000 mL/kg/h | INTRAVENOUS | Status: DC

## 2022-12-11 SURGICAL SUPPLY — 12 items
CATH INFINITI 5FR JL5 (CATHETERS) IMPLANT
CATH INFINITI AMBI 5FR TG (CATHETERS) IMPLANT
DEVICE RAD TR BAND REGULAR (VASCULAR PRODUCTS) IMPLANT
DRAPE BRACHIAL (DRAPES) IMPLANT
GLIDESHEATH SLEND SS 6F .021 (SHEATH) IMPLANT
GUIDEWIRE INQWIRE 1.5J.035X260 (WIRE) IMPLANT
INQWIRE 1.5J .035X260CM (WIRE) ×1 IMPLANT
PACK CARDIAC CATH (CUSTOM PROCEDURE TRAY) ×1 IMPLANT
PROTECTION STATION PRESSURIZED (MISCELLANEOUS) ×1 IMPLANT
SET ATX-X65L (MISCELLANEOUS) IMPLANT
STATION PROTECTION PRESSURIZED (MISCELLANEOUS) IMPLANT
WIRE HITORQ VERSACORE ST 145CM (WIRE) IMPLANT

## 2022-12-11 NOTE — Progress Notes (Signed)
PHARMACY - ANTICOAGULATION CONSULT NOTE  Pharmacy Consult for heparin infusion Indication: chest pain/ACS  Allergies  Allergen Reactions   Penicillins Swelling    Of tongue Has patient had a PCN reaction causing immediate rash, facial/tongue/throat swelling, SOB or lightheadedness with hypotension: Yes Has patient had a PCN reaction causing severe rash involving mucus membranes or skin necrosis: No Has patient had a PCN reaction that required hospitalization: No Has patient had a PCN reaction occurring within the last 10 years: No If all of the above answers are "NO", then may proceed with Cephalosporin use.   Cialis [Tadalafil] Other (See Comments)    Indigestion   Lipitor [Atorvastatin] Other (See Comments)    Severe Myalgias   Lisinopril Cough    Patient Measurements: Height: 5\' 10"  (177.8 cm) Weight: 80 kg (176 lb 5.9 oz) IBW/kg (Calculated) : 73 Heparin Dosing Weight: 83.9 kg  Vital Signs: Temp: 99.2 F (37.3 C) (11/20 0345) Temp Source: Oral (11/20 0345) BP: 148/61 (11/20 0542) Pulse Rate: 57 (11/20 0345)  Labs: Recent Labs    12/09/22 1832 12/09/22 2021 12/10/22 0623 12/10/22 0750 12/10/22 1400 12/10/22 1547 12/11/22 0644  HGB 10.7*  --   --  8.4*  --   --  8.8*  HCT 31.8*  --   --  24.9*  --   --  25.6*  PLT 131*  --   --  111*  --   --  116*  APTT  --   --  168*  --   --   --   --   LABPROT  --   --  16.2*  --   --   --   --   INR  --   --  1.3*  --   --   --   --   HEPARINUNFRC  --   --   --  0.47  --  0.56 0.54  CREATININE 1.79*  --   --   --   --   --  2.06*  TROPONINIHS 23* 172*  --  2,561* 2,514*  --   --     Estimated Creatinine Clearance: 23.6 mL/min (A) (by C-G formula based on SCr of 2.06 mg/dL (H)).   Medical History: Past Medical History:  Diagnosis Date   Abnormal drug screen 06/22/2014   inapprop neg xanax rpt 3 mo (06/2014)   BPH (benign prostatic hypertrophy) 01/21/1998   has had 3 biopsies in past ([facility]) decided to stop  PSA/DRE   CHF (congestive heart failure) (HCC)    Coronary artery disease    Hyperlipidemia 01/21/2002   Hypertension 05/22/2003   Left lumbar radiculopathy    Osteoarthritis 01/22/1988   knees, lumbar spondylosis and listhesis   Pertinent medications:  A review of recent dispense history reveals no chronic anticoagulation prior to arrival.   Assessment: 89 y.o. male with PMH of BPH, HTN, CHF, HLD CKD, coronary disease who comes in with concern for chest pain. Troponin was found elevated on arrival 2,561. 3/5 ECHO 30-35% LVEF. Severely elevated pulmonary artery systolic pressure. Planned for patient to go to cath lab.   Baseline Labs: aPTT 168 sec(believed to be pulled after heparin was started, no signs/concerns for bleeding noted), INR 1.3, Hgb 10.7, Plts 131(baseline in the low 100s)  11/19 0750 HL 0.47 Therapeutic x 1   11/19 1547 HL 0.56  Therapeutic x 2  11/19 0644 HL 0.54 Therapeutic            Goal of Therapy:  Heparin level 0.3-0.7 units/ml Monitor platelets by anticoagulation protocol: Yes   Plan:  Continue heparin infusion at 1,000 units/hr Check HL with AM labs  Check CBC daily while on heparin Continue to monitor H&H and platelets(trending down, on lower end of baseline)  Effie Shy, PharmD Pharmacy Resident  12/11/2022 7:48 AM

## 2022-12-11 NOTE — Progress Notes (Signed)
Around 1910H this evening, pt had an episode of chest pain, pain scale 9/10. 12L EKG done. 1st dose of Nitroglycerin SL given (pain scale 8/10). NP Jon Billings made aware via secure chat. Placed on O2 4lpm via nasa cannula. After 5 mins, pt's pain scale is 3/10, 2nd dose of Nitroglycerin SL given.   1930H: chest pain resolved. Spoked with Dr. Darrold Junker over the phone. Updated Dr. Darrold Junker about the chest pain episode.

## 2022-12-11 NOTE — Progress Notes (Addendum)
PROGRESS NOTE    Shawn Andrews  WUJ:[id number] DOB: August 30, 1928 DOA: 12/09/2022 PCP: Mick Sell, MD  Outpatient Specialists: cardiology    Brief Narrative:   Shawn Andrews is a 89 y.o. Caucasian male with medical history significant for coronary artery disease, status post PCI 3 stents, CHF, hypertension, essential tremors, dyslipidemia and osteoarthritis, who presented to the emergency room with acute onset of chest pain that started around 5 PM.  Initially thought it was related to GERD and and took acid suppressant that did not help.  He later took 2 nitroglycerin tablets that did not help either.  He denied any associated nausea or vomiting or diaphoresis.  He denied any radiation to his pain.  No dyspnea or palpitations.  He is having mild wheezing though while talking.  No fever or chills.  No cough or hemoptysis.  No leg pain or edema or recent travels or surgeries.  No dysuria, oliguria or hematuria or flank pain.  No bleeding diathesis.    Assessment & Plan:   Principal Problem:   NSTEMI (non-ST elevated myocardial infarction) (HCC) Active Problems:   HFrEF (heart failure with reduced ejection fraction) (HCC)   Chronic kidney disease, stage 3b (HCC)   Suprapubic catheter (HCC)   Essential hypertension   Essential tremor   Leg DVT (deep venous thromboembolism), acute, left (HCC)   CAD (coronary artery disease)   Dyslipidemia   BPH (benign prostatic hyperplasia)  # NSTEMI Central non-radiating non-exertional chest pain yesterday that has resolved. Troponins have trended 23>2500. No overt ischemic changes on EKG, nothing acute on cxr. Received aspirin. Currently feeling well. - cardiology following - continue heparin - LHC today - maintain tele  # CAD History 3 prior stents most recently 03/2022. - heparin as above - continue plavix, statin  # BPH - Cont home finasteride  # Suprapubic catheter 2/2 bph, chronic - catheter care. No s/s uti  # HFrEF Ef 35%  last TTE, here 40-45. Appears euvolemic - cont home lasix, imdur  # HTN Normotensive - home hydral  # History provoked DVT Not currently anticoagulated  # CKD 3b Gfr is at baseline - monitor  # Tremor - home primidone  DVT prophylaxis: iv heparin Code Status: full, confirmed with patient and wife at bedside Family Communication: wife updated @ bedside 11/20  Level of care: Progressive     Consultants:  cardiology  Procedures: pending  Antimicrobials:  none    Subjective: No chest pain or dyspnea or palpitations or other complaints  Objective: Vitals:   12/11/22 0345 12/11/22 0409 12/11/22 0542 12/11/22 0944  BP: (!) 148/61  (!) 148/61 (!) 133/56  Pulse: (!) 57   63  Resp: 18   16  Temp: 99.2 F (37.3 C)   98 F (36.7 C)  TempSrc: Oral     SpO2: 96%   96%  Weight:  80 kg    Height:        Intake/Output Summary (Last 24 hours) at 12/11/2022 1300 Last data filed at 12/11/2022 0946 Gross per 24 hour  Intake 635.47 ml  Output 1700 ml  Net -1064.53 ml   Filed Weights   12/09/22 2055 12/10/22 1556 12/11/22 0409  Weight: 83.5 kg 82.1 kg 80 kg    Examination:  General exam: Appears calm and comfortable  Respiratory system: Clear to auscultation. Respiratory effort normal. Cardiovascular system: S1 & S2 heard, RRR. Systolic murmur Gastrointestinal system: Abdomen is nondistended, soft and nontender. No organomegaly or masses felt. Suprapubic catheter  in place Central nervous system: Alert and oriented. No focal neurological deficits. Extremities: Symmetric 5 x 5 power. No LE edema Skin: No rashes, lesions or ulcers Psychiatry: Judgement and insight appear normal. Mood & affect appropriate.     Data Reviewed: I have personally reviewed following labs and imaging studies  CBC: Recent Labs  Lab 12/09/22 1832 12/10/22 0750 12/11/22 0644  WBC 7.8 4.8 5.8  HGB 10.7* 8.4* 8.8*  HCT 31.8* 24.9* 25.6*  MCV 90.1 88.6 85.9  PLT 131* 111* 116*    Basic Metabolic Panel: Recent Labs  Lab 12/09/22 1832 12/11/22 0644  NA 135 138  K 4.3 4.6  CL 106 110  CO2 20* 22  GLUCOSE 179* 96  BUN 35* 33*  CREATININE 1.79* 2.06*  CALCIUM 8.7* 8.6*   GFR: Estimated Creatinine Clearance: 23.6 mL/min (A) (by C-G formula based on SCr of 2.06 mg/dL (H)). Liver Function Tests: Recent Labs  Lab 12/09/22 1832  AST 15  ALT 8  ALKPHOS 53  BILITOT 0.7  PROT 6.5  ALBUMIN 4.2   Recent Labs  Lab 12/09/22 1832  LIPASE 27   No results for input(s): "AMMONIA" in the last 168 hours. Coagulation Profile: Recent Labs  Lab 12/10/22 0623  INR 1.3*   Cardiac Enzymes: No results for input(s): "CKTOTAL", "CKMB", "CKMBINDEX", "TROPONINI" in the last 168 hours. BNP (last 3 results) No results for input(s): "PROBNP" in the last 8760 hours. HbA1C: No results for input(s): "HGBA1C" in the last 72 hours. CBG: No results for input(s): "GLUCAP" in the last 168 hours. Lipid Profile: No results for input(s): "CHOL", "HDL", "LDLCALC", "TRIG", "CHOLHDL", "LDLDIRECT" in the last 72 hours. Thyroid Function Tests: No results for input(s): "TSH", "T4TOTAL", "FREET4", "T3FREE", "THYROIDAB" in the last 72 hours. Anemia Panel: No results for input(s): "VITAMINB12", "FOLATE", "FERRITIN", "TIBC", "IRON", "RETICCTPCT" in the last 72 hours. Urine analysis:    Component Value Date/Time   COLORURINE AMBER (A) 08/10/2022 0900   APPEARANCEUR Hazy (A) 08/20/2022 1350   LABSPEC 1.017 08/10/2022 0900   PHURINE 5.0 08/10/2022 0900   GLUCOSEU Negative 08/20/2022 1350   GLUCOSEU NEGATIVE 12/25/2020 1556   HGBUR SMALL (A) 08/10/2022 0900   BILIRUBINUR Negative 08/20/2022 1350   KETONESUR NEGATIVE 08/10/2022 0900   PROTEINUR Negative 08/20/2022 1350   PROTEINUR 100 (A) 08/10/2022 0900   UROBILINOGEN 0.2 12/25/2020 1556   NITRITE Negative 08/20/2022 1350   NITRITE POSITIVE (A) 08/10/2022 0900   LEUKOCYTESUR Negative 08/20/2022 1350   LEUKOCYTESUR MODERATE (A)  08/10/2022 0900   Sepsis Labs: @LABRCNTIP (procalcitonin:4,lacticidven:4)  ) Recent Results (from the past 240 hour(s))  Resp panel by RT-PCR (RSV, Flu A&B, Covid) Anterior Nasal Swab     Status: None   Collection Time: 12/09/22  8:15 PM   Specimen: Anterior Nasal Swab  Result Value Ref Range Status   SARS Coronavirus 2 by RT PCR NEGATIVE NEGATIVE Final    Comment: (NOTE) SARS-CoV-2 target nucleic acids are NOT DETECTED.  The SARS-CoV-2 RNA is generally detectable in upper respiratory specimens during the acute phase of infection. The lowest concentration of SARS-CoV-2 viral copies this assay can detect is 138 copies/mL. A negative result does not preclude SARS-Cov-2 infection and should not be used as the sole basis for treatment or other patient management decisions. A negative result may occur with  improper specimen collection/handling, submission of specimen other than nasopharyngeal swab, presence of viral mutation(s) within the areas targeted by this assay, and inadequate number of viral copies(<138 copies/mL). A negative result must be  combined with clinical observations, patient history, and epidemiological information. The expected result is Negative.  Fact Sheet for Patients:  BloggerCourse.com  Fact Sheet for Healthcare Providers:  SeriousBroker.it  This test is no t yet approved or cleared by the [place] FDA and  has been authorized for detection and/or diagnosis of SARS-CoV-2 by FDA under an Emergency Use Authorization (EUA). This EUA will remain  in effect (meaning this test can be used) for the duration of the COVID-19 declaration under Section 564(b)(1) of the Act, 21 U.S.C.section 360bbb-3(b)(1), unless the authorization is terminated  or revoked sooner.       Influenza A by PCR NEGATIVE NEGATIVE Final   Influenza B by PCR NEGATIVE NEGATIVE Final    Comment: (NOTE) The Xpert Xpress  SARS-CoV-2/FLU/RSV plus assay is intended as an aid in the diagnosis of influenza from Nasopharyngeal swab specimens and should not be used as a sole basis for treatment. Nasal washings and aspirates are unacceptable for Xpert Xpress SARS-CoV-2/FLU/RSV testing.  Fact Sheet for Patients: BloggerCourse.com  Fact Sheet for Healthcare Providers: SeriousBroker.it  This test is not yet approved or cleared by the [place] FDA and has been authorized for detection and/or diagnosis of SARS-CoV-2 by FDA under an Emergency Use Authorization (EUA). This EUA will remain in effect (meaning this test can be used) for the duration of the COVID-19 declaration under Section 564(b)(1) of the Act, 21 U.S.C. section 360bbb-3(b)(1), unless the authorization is terminated or revoked.     Resp Syncytial Virus by PCR NEGATIVE NEGATIVE Final    Comment: (NOTE) Fact Sheet for Patients: BloggerCourse.com  Fact Sheet for Healthcare Providers: SeriousBroker.it  This test is not yet approved or cleared by the [place] FDA and has been authorized for detection and/or diagnosis of SARS-CoV-2 by FDA under an Emergency Use Authorization (EUA). This EUA will remain in effect (meaning this test can be used) for the duration of the COVID-19 declaration under Section 564(b)(1) of the Act, 21 U.S.C. section 360bbb-3(b)(1), unless the authorization is terminated or revoked.  Performed at [facility], [address] [place], [place] [address]          Radiology Studies: ECHOCARDIOGRAM COMPLETE  Result Date: 12/11/2022    ECHOCARDIOGRAM REPORT   Patient Name:   Shawn Andrews Date of Exam: 12/10/2022 Medical Rec #:  [id number]     Height:       70.0 in Accession #:    [id number]    Weight:       184.0 lb Date of Birth:  02/16/1928     BSA:          2.015 m Patient Age:    89 years      BP:            128/49 mmHg Patient Gender: M             HR:           56 bpm. Exam Location:  [facility] Procedure: 2D Echo, Cardiac Doppler, Color Doppler and Strain Analysis Indications:     NSTEMI I21.4  History:         Patient has prior history of Echocardiogram examinations, most                  recent 03/26/2022. CHF; Risk Factors:Dyslipidemia and                  Hypertension.  Sonographer:     Cristela Blue Referring Phys:  [id number]  CARALYN HUDSON Diagnosing Phys: Marcina Millard MD  Sonographer Comments: Global longitudinal strain was attempted. IMPRESSIONS  1. Left ventricular ejection fraction, by estimation, is 45 to 50%. The left ventricle has mildly decreased function. The left ventricle demonstrates regional wall motion abnormalities (see scoring diagram/findings for description). Left ventricular diastolic parameters were normal.  2. Right ventricular systolic function is normal. The right ventricular size is normal.  3. The mitral valve is normal in structure. Mild mitral valve regurgitation. No evidence of mitral stenosis.  4. The aortic valve is normal in structure. Aortic valve regurgitation is trivial. No aortic stenosis is present.  5. The inferior vena cava is normal in size with greater than 50% respiratory variability, suggesting right atrial pressure of 3 mmHg. FINDINGS  Left Ventricle: Left ventricular ejection fraction, by estimation, is 45 to 50%. The left ventricle has mildly decreased function. The left ventricle demonstrates regional wall motion abnormalities. The left ventricular internal cavity size was normal in size. There is no left ventricular hypertrophy. Left ventricular diastolic parameters were normal.  LV Wall Scoring: The apical lateral segment, apical septal segment, and apex are hypokinetic. Right Ventricle: The right ventricular size is normal. No increase in right ventricular wall thickness. Right ventricular systolic function is normal. Left Atrium: Left atrial size was normal in  size. Right Atrium: Right atrial size was normal in size. Pericardium: There is no evidence of pericardial effusion. Mitral Valve: The mitral valve is normal in structure. Mild mitral valve regurgitation. No evidence of mitral valve stenosis. MV peak gradient, 4.6 mmHg. The mean mitral valve gradient is 2.0 mmHg. Tricuspid Valve: The tricuspid valve is normal in structure. Tricuspid valve regurgitation is mild . No evidence of tricuspid stenosis. Aortic Valve: The aortic valve is normal in structure. Aortic valve regurgitation is trivial. No aortic stenosis is present. Aortic valve mean gradient measures 3.0 mmHg. Aortic valve peak gradient measures 5.5 mmHg. Aortic valve area, by VTI measures 2.99 cm. Pulmonic Valve: The pulmonic valve was normal in structure. Pulmonic valve regurgitation is not visualized. No evidence of pulmonic stenosis. Aorta: The aortic root is normal in size and structure. Venous: The inferior vena cava is normal in size with greater than 50% respiratory variability, suggesting right atrial pressure of 3 mmHg. IAS/Shunts: No atrial level shunt detected by color flow Doppler.  LEFT VENTRICLE PLAX 2D LVIDd:         5.50 cm      Diastology LVIDs:         3.80 cm      LV e' medial:    4.03 cm/s LV PW:         1.00 cm      LV E/e' medial:  23.7 LV IVS:        1.00 cm      LV e' lateral:   12.90 cm/s LVOT diam:     2.20 cm      LV E/e' lateral: 7.4 LV SV:         71 LV SV Index:   35 LVOT Area:     3.80 cm  LV Volumes (MOD) LV vol d, MOD A2C: 162.0 ml LV vol d, MOD A4C: 175.0 ml LV vol s, MOD A2C: 100.0 ml LV vol s, MOD A4C: 102.0 ml LV SV MOD A2C:     62.0 ml LV SV MOD A4C:     175.0 ml LV SV MOD BP:      69.5 ml RIGHT VENTRICLE RV Basal diam:  3.40  cm RV Mid diam:    2.10 cm RV S prime:     9.57 cm/s TAPSE (M-mode): 2.5 cm LEFT ATRIUM           Index        RIGHT ATRIUM           Index LA diam:      3.90 cm 1.94 cm/m   RA Area:     26.90 cm LA Vol (A2C): 55.3 ml 27.45 ml/m  RA Volume:    98.70 ml  48.99 ml/m LA Vol (A4C): 82.8 ml 41.10 ml/m  AORTIC VALVE AV Area (Vmax):    2.67 cm AV Area (Vmean):   2.75 cm AV Area (VTI):     2.99 cm AV Vmax:           117.00 cm/s AV Vmean:          73.900 cm/s AV VTI:            0.238 m AV Peak Grad:      5.5 mmHg AV Mean Grad:      3.0 mmHg LVOT Vmax:         82.20 cm/s LVOT Vmean:        53.400 cm/s LVOT VTI:          0.187 m LVOT/AV VTI ratio: 0.79  AORTA Ao Root diam: 3.10 cm MITRAL VALVE               TRICUSPID VALVE MV Area (PHT): 3.45 cm    TR Peak grad:   32.7 mmHg MV Area VTI:   2.09 cm    TR Vmax:        286.00 cm/s MV Peak grad:  4.6 mmHg MV Mean grad:  2.0 mmHg    SHUNTS MV Vmax:       1.07 m/s    Systemic VTI:  0.19 m MV Vmean:      60.9 cm/s   Systemic Diam: 2.20 cm MV Decel Time: 220 msec MV E velocity: 95.40 cm/s MV A velocity: 77.60 cm/s MV E/A ratio:  1.23 Marcina Millard MD Electronically signed by Marcina Millard MD Signature Date/Time: 12/11/2022/12:18:03 PM    Final    DG Chest 2 View  Result Date: 12/09/2022 CLINICAL DATA:  chest pain Pt was eating soup at 5p when he developed CP. Took two nitroglycerin without relief. Current pain is 8/10. Hx of two MIs EXAM: CHEST - 2 VIEW COMPARISON:  Chest x-ray 08/10/2022, CT chest 03/25/2022 FINDINGS: Slightly widened appearing mediastinum due to patient rotation. The heart and mediastinal contours are unchanged. Atherosclerotic plaque. Coronary artery stent noted. No focal consolidation. No pulmonary edema. Possible trace right pleural effusion. No pneumothorax. No acute osseous abnormality. IMPRESSION: Possible trace right pleural effusion. Electronically Signed   By: Tish Frederickson M.D.   On: 12/09/2022 21:23        Scheduled Meds:  [START ON 12/12/2022] aspirin EC  81 mg Oral Daily   cholecalciferol  1,000 Units Oral Daily   clopidogrel  75 mg Oral Daily   cyanocobalamin  1,000 mcg Oral Daily   docusate sodium  100 mg Oral Daily   famotidine  20 mg Oral QHS    finasteride  5 mg Oral Daily   [START ON 12/12/2022] furosemide  20 mg Oral QODAY   hydrALAZINE  50 mg Oral Q8H   isosorbide mononitrate  60 mg Oral BID   metoprolol succinate  25 mg Oral BID   pantoprazole  40  mg Oral BID   primidone  100 mg Oral TID   rosuvastatin  20 mg Oral QHS   sodium chloride flush  3 mL Intravenous Q12H   Continuous Infusions:  sodium chloride     sodium chloride 50 mL/hr at 12/11/22 0953   heparin 1,100 Units/hr (12/10/22 1720)     LOS: 1 Shawn     Silvano Bilis, MD [facility]   If 7PM-7AM, please contact night-coverage [email or web address] Password [email or web address] 12/11/2022, 1:00 PM

## 2022-12-11 NOTE — Progress Notes (Cosign Needed Addendum)
[facility] CLINIC CARDIOLOGY PROGRESS NOTE       Patient ID: Shawn Andrews MRN: [id number] DOB/AGE: [birth year] 89 y.o.  Admit date: 12/09/2022 Referring Physician Dr. Artis Delay Primary Physician Sampson Goon Stann Mainland, MD Primary Cardiologist Dr. Dorothyann Peng Reason for Consultation ACS/NSTEMI  HPI: Shawn Andrews is a 89 y.o. male  with a past medical history of coronary artery disease, NSTEMI s/p two overlapping DES stent to proximal/mid LAD (09/2020), stent to proximal RCA and stent to in-stent restenosis of mid LAD (03/2022), chronic HFrEF (EF 30-35% 03/3022), hypertension, hyperlipidemia, DVT, CKD stage 3a, BPH who presented to the ED on 12/09/2022 for chest pain. Cardiology was consulted for further evaluation.   Interval history: -Patient reports he feels well this morning. Denies any recurrence of CP since initial episode. Denies SOB. -Cr up to 2.06 this AM. Endorses good UOP.   -BP and HR remain stable.   Review of systems complete and found to be negative unless listed above    Past Medical History:  Diagnosis Date   Abnormal drug screen 06/22/2014   inapprop neg xanax rpt 3 mo (06/2014)   BPH (benign prostatic hypertrophy) 01/21/1998   has had 3 biopsies in past ([facility]) decided to stop PSA/DRE   CHF (congestive heart failure) (HCC)    Coronary artery disease    Hyperlipidemia 01/21/2002   Hypertension 05/22/2003   Left lumbar radiculopathy    Osteoarthritis 01/22/1988   knees, lumbar spondylosis and listhesis    Past Surgical History:  Procedure Laterality Date   CATARACT EXTRACTION  2013   bilateral   CORONARY ANGIOGRAPHY N/A 03/29/2022   Procedure: CORONARY ANGIOGRAPHY;  Surgeon: Alwyn Pea, MD;  Location: [facility] INVASIVE CV LAB;  Service: Cardiovascular;  Laterality: N/A;   CORONARY STENT INTERVENTION N/A 03/27/2022   Procedure: CORONARY STENT INTERVENTION;  Surgeon: Alwyn Pea, MD;  Location: [facility] INVASIVE CV LAB;  Service: Cardiovascular;   Laterality: N/A;   CORONARY STENT INTERVENTION N/A 03/29/2022   Procedure: CORONARY STENT INTERVENTION;  Surgeon: Alwyn Pea, MD;  Location: [facility] INVASIVE CV LAB;  Service: Cardiovascular;  Laterality: N/A;   ESI Left 08/2014, 11/2014, 08/2015   L S1, L L5/S1 transforaminal ESI; L4/5 L5/S1 zygapophysial injections, L S1 transforaminal ([facility])   FINGER SURGERY     right middle, partial traumatic amputation   IR CATHETER TUBE CHANGE  03/02/2021   KNEE ARTHROSCOPY  1990   right   KNEE SURGERY  04/2006   right partial knee replacement in [place] - rec ppx abx for any invasive procedure   LEFT HEART CATH AND CORONARY ANGIOGRAPHY N/A 01/18/2021   Procedure: LEFT HEART CATH AND CORONARY ANGIOGRAPHY;  Surgeon: Lamar Blinks, MD;  Location: [facility] INVASIVE CV LAB;  Service: Cardiovascular;  Laterality: N/A;   LEFT HEART CATH AND CORONARY ANGIOGRAPHY N/A 03/27/2022   Procedure: LEFT HEART CATH AND CORONARY ANGIOGRAPHY;  Surgeon: Alwyn Pea, MD;  Location: [facility] INVASIVE CV LAB;  Service: Cardiovascular;  Laterality: N/A;    Medications Prior to Admission  Medication Sig Dispense Refill Last Dose   acetaminophen (TYLENOL 8 HOUR) 650 MG CR tablet Take 1 tablet (650 mg total) by mouth 2 (two) times a day.   prn   aspirin 81 MG chewable tablet Chew 1 tablet by mouth daily.   12/09/2022   Cholecalciferol (VITAMIN D3) 25 MCG (1000 UT) CAPS Take 1 capsule (1,000 Units total) by mouth daily. 30 capsule  12/09/2022   clopidogrel (PLAVIX) 75 MG tablet Take 75 mg  by mouth daily.   12/09/2022 at 1800   cyanocobalamin 1000 MCG tablet Take 1,000 mcg by mouth daily.   12/09/2022   docusate sodium (COLACE) 100 MG capsule Take 1 capsule (100 mg total) by mouth daily.      famotidine (PEPCID) 20 MG tablet TAKE 1 TABLET BY MOUTH AT  BEDTIME 90 tablet 3 12/09/2022   finasteride (PROSCAR) 5 MG tablet TAKE 1 TABLET BY MOUTH  DAILY 90 tablet 3 12/09/2022   furosemide (LASIX) 20 MG tablet Take 1 tablet by mouth  every other day.   12/09/2022   hydrALAZINE (APRESOLINE) 50 MG tablet Take 1 tablet (50 mg total) by mouth every 8 (eight) hours. 90 tablet 0 12/09/2022   isosorbide mononitrate (IMDUR) 60 MG 24 hr tablet Take 1 tablet (60 mg total) by mouth 2 (two) times daily. 60 tablet 1 12/09/2022   loratadine (CLARITIN) 10 MG tablet Take 1 tablet (10 mg total) by mouth daily as needed for allergies. 30 tablet  12/09/2022   Melatonin 10 MG TABS Take 1 tablet by mouth at bedtime as needed.   prn   metoprolol tartrate (LOPRESSOR) 50 MG tablet Take 0.5 tablets (25 mg total) by mouth 2 (two) times daily.   12/09/2022   nitroGLYCERIN (NITROSTAT) 0.4 MG SL tablet Place 1 tablet (0.4 mg total) under the tongue every 5 (five) minutes x 3 doses as needed for chest pain. 30 tablet 12 prn   pantoprazole (PROTONIX) 40 MG tablet Take 40 mg by mouth 2 (two) times daily.   12/09/2022   polyethylene glycol powder (GLYCOLAX/MIRALAX) 17 GM/SCOOP powder Take 8.5-17 g by mouth daily as needed for moderate constipation. 3350 g 0 prn   primidone (MYSOLINE) 50 MG tablet Take 2 tablets (100 mg total) by mouth 3 (three) times daily.   12/09/2022   rosuvastatin (CRESTOR) 20 MG tablet Take 20 mg by mouth at bedtime.   12/09/2022   Social History   Socioeconomic History   Marital status: Married    Spouse name: Not on file   Number of children: Not on file   Years of education: Not on file   Highest education level: Not on file  Occupational History   Occupation: retired Patent examiner, Network engineer, owns lawn care business  Tobacco Use   Smoking status: Former    Current packs/day: 0.00    Types: Pipe, Cigarettes    Quit date: 12/26/2000    Years since quitting: 21.9    Passive exposure: Past   Smokeless tobacco: Current    Types: Chew   Tobacco comments:    quit over 30 years ago  Vaping Use   Vaping status: Never Used  Substance and Sexual Activity   Alcohol use: Yes    Alcohol/week: 0.0 standard drinks of  alcohol    Comment: Occasional beer   Drug use: No   Sexual activity: Not Currently  Other Topics Concern   Not on file  Social History Narrative   Widowed, remarried 08/13/07.   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 [employer]    Activity: walks 1.5 miles 3d/wk   Diet: good water, fruits/vegetables daily   Social Determinants of Health   Financial Resource Strain: Low Risk  (12/06/2022)   Received from [facility]   Overall Financial Resource Strain (CARDIA)    Difficulty of Paying Living Expenses: Not hard at all  Food Insecurity: No Food Insecurity (12/10/2022)  Hunger Vital Sign    Worried About Running Out of Food in the Last Year: Never true    Ran Out of Food in the Last Year: Never true  Transportation Needs: No Transportation Needs (12/10/2022)   PRAPARE - Administrator, Civil Service (Medical): No    Lack of Transportation (Non-Medical): No  Physical Activity: Not on file  Stress: Not on file  Social Connections: Not on file  Intimate Partner Violence: Not At Risk (12/10/2022)   Humiliation, Afraid, Rape, and Kick questionnaire    Fear of Current or Ex-Partner: No    Emotionally Abused: No    Physically Abused: No    Sexually Abused: No    Family History  Problem Relation Age of Onset   Hypertension Mother    Heart disease Brother 20       MI   Diabetes Brother    Cancer Daughter        melanoma   Stroke Neg Hx      Vitals:   12/10/22 2033 12/11/22 0345 12/11/22 0409 12/11/22 0542  BP: (!) 149/65 (!) 148/61  (!) 148/61  Pulse: 61 (!) 57    Resp:  18    Temp:  99.2 F (37.3 C)    TempSrc:  Oral    SpO2:  96%    Weight:   80 kg   Height:        PHYSICAL EXAM General: Well-appearing elderly male, well nourished, in no acute distress, laying nearly flat in hospital bed. HEENT: Normocephalic and atraumatic. Neck: No JVD.   Lungs:  Normal respiratory effort on room air. Clear bilaterally to auscultation. No wheezes, crackles, rhonchi.  Heart: HRRR. Normal S1 and S2 without gallops or murmurs.  Abdomen: Non-distended appearing.  Msk: Normal strength and tone for age. Extremities: Warm and well perfused. No clubbing, cyanosis. No edema.  Neuro: Alert and oriented X 3. Psych: Answers questions appropriately.   Labs: Basic Metabolic Panel: Recent Labs    12/09/22 1832 12/11/22 0644  NA 135 138  K 4.3 4.6  CL 106 110  CO2 20* 22  GLUCOSE 179* 96  BUN 35* 33*  CREATININE 1.79* 2.06*  CALCIUM 8.7* 8.6*   Liver Function Tests: Recent Labs    12/09/22 1832  AST 15  ALT 8  ALKPHOS 53  BILITOT 0.7  PROT 6.5  ALBUMIN 4.2   Recent Labs    12/09/22 1832  LIPASE 27   CBC: Recent Labs    12/10/22 0750 12/11/22 0644  WBC 4.8 5.8  HGB 8.4* 8.8*  HCT 24.9* 25.6*  MCV 88.6 85.9  PLT 111* 116*   Cardiac Enzymes: Recent Labs    12/09/22 2021 12/10/22 0750 12/10/22 1400  TROPONINIHS 172* 2,561* 2,514*   BNP: No results for input(s): "BNP" in the last 72 hours. D-Dimer: No results for input(s): "DDIMER" in the last 72 hours. Hemoglobin A1C: No results for input(s): "HGBA1C" in the last 72 hours. Fasting Lipid Panel: No results for input(s): "CHOL", "HDL", "LDLCALC", "TRIG", "CHOLHDL", "LDLDIRECT" in the last 72 hours. Thyroid Function Tests: No results for input(s): "TSH", "T4TOTAL", "T3FREE", "THYROIDAB" in the last 72 hours.  Invalid input(s): "FREET3" Anemia Panel: No results for input(s): "VITAMINB12", "FOLATE", "FERRITIN", "TIBC", "IRON", "RETICCTPCT" in the last 72 hours.   Radiology: DG Chest 2 View  Result Date: 12/09/2022 CLINICAL DATA:  chest pain Pt was eating soup at 5p when he developed CP. Took two nitroglycerin without relief. Current pain is 8/10. Hx  of two MIs EXAM: CHEST - 2 VIEW COMPARISON:  Chest x-ray 08/10/2022, CT chest 03/25/2022 FINDINGS: Slightly widened appearing  mediastinum due to patient rotation. The heart and mediastinal contours are unchanged. Atherosclerotic plaque. Coronary artery stent noted. No focal consolidation. No pulmonary edema. Possible trace right pleural effusion. No pneumothorax. No acute osseous abnormality. IMPRESSION: Possible trace right pleural effusion. Electronically Signed   By: Tish Frederickson M.D.   On: 12/09/2022 21:23    NM Myocardial Perfusion SPECT multiple: 01/08/2022 Abnormal myocardial perfusion scan left ventricular enlargement globally depressed ejection fraction of 35% there is a moderate to large area of anterior apical persistent defect consistent with scar there is also RV uptake consistent with systolic cardiomyopathy.  No clear evidence of reversible ischemia. Conclusion this is a high risk study   Most recent cardiac caths:  03/27/2022   Mid LAD lesion is 95% stenosed.   Prox RCA lesion is 75% stenosed.   Dist LM to Ost LAD lesion is 60% stenosed.   Dist LAD lesion is 50% stenosed.   A drug-eluting stent was successfully placed using a STENT ONYX FRONTIER 3.5X15.   Post intervention, there is a 0% residual stenosis.   There is moderate left ventricular systolic dysfunction.   LV end diastolic pressure is mildly elevated.   The left ventricular ejection fraction is 35-45% by visual estimate.  03/29/2022   Mid LAD lesion is 95% stenosed.   Dist LAD lesion is 50% stenosed.   Dist LM to Ost LAD lesion is 60% stenosed.   Non-stenotic Prox RCA lesion was previously treated.   A stent was successfully placed.   Post intervention, there is a 0% residual stenosis.  ECHO 03/26/2022  1. Ant/Apical hypo.   2. Left ventricular ejection fraction, by estimation, is 30 to 35%. The left ventricle has moderately decreased function. The left ventricle demonstrates regional wall motion abnormalities (see scoring diagram/findings for description). The left ventricular internal cavity size was moderately dilated. There is  mild concentric left ventricular hypertrophy. Left ventricular diastolic parameters are consistent with Grade I diastolic dysfunction (impaired  relaxation).   3. Right ventricular systolic function is low normal. The right ventricular size is mildly enlarged. Mildly increased right ventricular wall thickness. There is severely elevated pulmonary artery systolic pressure.   4. Left atrial size was moderately dilated.   5. Right atrial size was mildly dilated.   6. The mitral valve is normal in structure. Mild mitral valve regurgitation.   7. Tricuspid valve regurgitation is mild to moderate.   8. The aortic valve is grossly normal. Aortic valve regurgitation is not visualized.   TELEMETRY reviewed by me 12/11/2022: Sinus rhythm, rate 60s  EKG reviewed by me: NSR with non specific ST-T changes, rate 92 bpm.   Data reviewed by me 12/11/2022: last 24h vitals tele labs imaging I/O ED provider note, admission H&P.  Principal Problem:   NSTEMI (non-ST elevated myocardial infarction) (HCC) Active Problems:   Essential hypertension   Essential tremor   Chronic kidney disease, stage 3b (HCC)   Leg DVT (deep venous thromboembolism), acute, left (HCC)   CAD (coronary artery disease)   HFrEF (heart failure with reduced ejection fraction) (HCC)   Suprapubic catheter (HCC)   Dyslipidemia   BPH (benign prostatic hyperplasia)    ASSESSMENT AND PLAN:  Shawn Andrews is a 89 y.o. male  with a past medical history of coronary artery disease, NSTEMI s/p two overlapping DES stent to proximal/mid LAD (09/2020), stent to proximal RCA and  stent to in-stent restenosis of mid LAD (03/2022), chronic HFrEF (EF 30-35% 03/3022), hypertension, hyperlipidemia, DVT, CKD stage 3a, BPH who presented to the ED on 12/09/2022 for chest pain. Cardiology was consulted for further evaluation.   # NSTEMI, type 1 # Coronary artery disease s/p 3 stents (mid/proximal LAD, proximal RCA) # Chronic HFrEF (EF 30-35% -  03/2022) Patient presented to ED with "sharp" diffuse chest pain across chest that occurred at rest with no associated shortness of breath. Troponin elevated and trending 23 > 172 > 2,561 > 2514. EKG in ED showed NSR with non specific ST-T changes, rate 92 bpm. S/p ASA 324 mg. Patient started on heparin gtt in the ED. No recurrence of CP. -Continue heparin gtt, will plan to DC after LHC. Continue aspirin 81 mg daily and plavix 75 mg daily. -Continue home Imdur 60 mg BID, metoprolol succinate 25 mg BID, hydralazine 50 mg q8hrs. -Discussed the risks and benefits of proceeding with LHC for further evaluation with the patient.  He is agreeable to proceed.  NPO until LHC this afternoon (11/20) with Dr. Darrold Junker.  Written consent will be obtained.  Further recommendations following LHC.    #Hypertension # Hyperlipidemia -Continue home rosuvastatin 20 mg daily.  -Home BP medications as above.   #CKD stage 3a # Anemia Upon arrival to ED, Cr 1.79 (at baseline) with stable Na and K. Cr up to 2.06 this AM. Hgb dropped from 10.7 to 8.4 on admission now stable at 8.8. -Continue to monitor renal function.  -Will give pre-hydration prior to [facility] given increase in Cr.  This patient's plan of care was discussed and created with Dr. Darrold Junker and he is in agreement.  Signed: Gale Journey, PA-C  12/11/2022, 8:34 AM [facility]

## 2022-12-12 ENCOUNTER — Encounter: Payer: Self-pay | Admitting: Cardiology

## 2022-12-12 DIAGNOSIS — I214 Non-ST elevation (NSTEMI) myocardial infarction: Secondary | ICD-10-CM | POA: Diagnosis not present

## 2022-12-12 LAB — CBC
HCT: 28.4 % — ABNORMAL LOW (ref 39.0–52.0)
Hemoglobin: 9.8 g/dL — ABNORMAL LOW (ref 13.0–17.0)
MCH: 29.6 pg (ref 26.0–34.0)
MCHC: 34.5 g/dL (ref 30.0–36.0)
MCV: 85.8 fL (ref 80.0–100.0)
Platelets: 133 10*3/uL — ABNORMAL LOW (ref 150–400)
RBC: 3.31 MIL/uL — ABNORMAL LOW (ref 4.22–5.81)
RDW: 13.9 % (ref 11.5–15.5)
WBC: 7.3 10*3/uL (ref 4.0–10.5)
nRBC: 0 % (ref 0.0–0.2)

## 2022-12-12 LAB — BASIC METABOLIC PANEL
Anion gap: 8 (ref 5–15)
BUN: 29 mg/dL — ABNORMAL HIGH (ref 8–23)
CO2: 22 mmol/L (ref 22–32)
Calcium: 8.5 mg/dL — ABNORMAL LOW (ref 8.9–10.3)
Chloride: 107 mmol/L (ref 98–111)
Creatinine, Ser: 1.85 mg/dL — ABNORMAL HIGH (ref 0.61–1.24)
GFR, Estimated: 34 mL/min — ABNORMAL LOW (ref >60)
Glucose, Bld: 104 mg/dL — ABNORMAL HIGH (ref 70–99)
Potassium: 4.8 mmol/L (ref 3.5–5.1)
Sodium: 137 mmol/L (ref 135–145)

## 2022-12-12 NOTE — Plan of Care (Signed)
  Problem: Education: Goal: Knowledge of General Education information will improve Description: Including pain rating scale, medication(s)/side effects and non-pharmacologic comfort measures Outcome: Progressing   Problem: Clinical Measurements: Goal: Ability to maintain clinical measurements within normal limits will improve Outcome: Progressing Goal: Will remain free from infection Outcome: Progressing Goal: Diagnostic test results will improve Outcome: Progressing Goal: Cardiovascular complication will be avoided Outcome: Progressing   Problem: Nutrition: Goal: Adequate nutrition will be maintained Outcome: Progressing

## 2022-12-12 NOTE — Progress Notes (Signed)
[facility] CLINIC CARDIOLOGY PROGRESS NOTE       Patient ID: Shawn Andrews MRN: [id number] DOB/AGE: 87/10/1928 87 y.o.  Admit date: 12/09/2022 Referring Physician Dr. Artis Delay Primary Physician Sampson Goon Stann Mainland, MD Primary Cardiologist Dr. Dorothyann Peng Reason for Consultation ACS/NSTEMI  HPI: Shawn Andrews is a 89 y.o. male  with a past medical history of coronary artery disease, NSTEMI s/p two overlapping DES stent to proximal/mid LAD (09/2020), stent to proximal RCA and stent to in-stent restenosis of mid LAD (03/2022), chronic HFrEF (EF 30-35% 03/3022), hypertension, hyperlipidemia, DVT, CKD stage 3a, BPH who presented to the ED on 12/09/2022 for chest pain. Cardiology was consulted for further evaluation.   Interval history: -Patient seen and examined this AM. Reports he is feeling well. Wife reports some confusion overnight.  -Patient did have episode of discomfort yesterday evening while eating dinner. Resolved with NTG. Similar to prior episodes recently.  -BP and HR stable.  -Renal function improved today.   Review of systems complete and found to be negative unless listed above    Past Medical History:  Diagnosis Date   Abnormal drug screen 06/22/2014   inapprop neg xanax rpt 3 mo (06/2014)   BPH (benign prostatic hypertrophy) 01/21/1998   has had 3 biopsies in past ([facility]) decided to stop PSA/DRE   CHF (congestive heart failure) (HCC)    Coronary artery disease    Hyperlipidemia 01/21/2002   Hypertension 05/22/2003   Left lumbar radiculopathy    Osteoarthritis 01/22/1988   knees, lumbar spondylosis and listhesis    Past Surgical History:  Procedure Laterality Date   CATARACT EXTRACTION  2013   bilateral   CORONARY ANGIOGRAPHY N/A 03/29/2022   Procedure: CORONARY ANGIOGRAPHY;  Surgeon: Alwyn Pea, MD;  Location: [facility] INVASIVE CV LAB;  Service: Cardiovascular;  Laterality: N/A;   CORONARY STENT INTERVENTION N/A 03/27/2022   Procedure: CORONARY STENT  INTERVENTION;  Surgeon: Alwyn Pea, MD;  Location: [facility] INVASIVE CV LAB;  Service: Cardiovascular;  Laterality: N/A;   CORONARY STENT INTERVENTION N/A 03/29/2022   Procedure: CORONARY STENT INTERVENTION;  Surgeon: Alwyn Pea, MD;  Location: [facility] INVASIVE CV LAB;  Service: Cardiovascular;  Laterality: N/A;   ESI Left 08/2014, 11/2014, 08/2015   L S1, L L5/S1 transforaminal ESI; L4/5 L5/S1 zygapophysial injections, L S1 transforaminal ([facility])   FINGER SURGERY     right middle, partial traumatic amputation   IR CATHETER TUBE CHANGE  03/02/2021   KNEE ARTHROSCOPY  1990   right   KNEE SURGERY  04/2006   right partial knee replacement in [place] - rec ppx abx for any invasive procedure   LEFT HEART CATH AND CORONARY ANGIOGRAPHY N/A 01/18/2021   Procedure: LEFT HEART CATH AND CORONARY ANGIOGRAPHY;  Surgeon: Lamar Blinks, MD;  Location: [facility] INVASIVE CV LAB;  Service: Cardiovascular;  Laterality: N/A;   LEFT HEART CATH AND CORONARY ANGIOGRAPHY N/A 03/27/2022   Procedure: LEFT HEART CATH AND CORONARY ANGIOGRAPHY;  Surgeon: Alwyn Pea, MD;  Location: [facility] INVASIVE CV LAB;  Service: Cardiovascular;  Laterality: N/A;   LEFT HEART CATH AND CORONARY ANGIOGRAPHY N/A 12/11/2022   Procedure: LEFT HEART CATH AND CORONARY ANGIOGRAPHY;  Surgeon: Marcina Millard, MD;  Location: [facility] INVASIVE CV LAB;  Service: Cardiovascular;  Laterality: N/A;    Medications Prior to Admission  Medication Sig Dispense Refill Last Dose   acetaminophen (TYLENOL 8 HOUR) 650 MG CR tablet Take 1 tablet (650 mg total) by mouth 2 (two) times a day.   prn  aspirin 81 MG chewable tablet Chew 1 tablet by mouth daily.   12/09/2022   Cholecalciferol (VITAMIN D3) 25 MCG (1000 UT) CAPS Take 1 capsule (1,000 Units total) by mouth daily. 30 capsule  12/09/2022   clopidogrel (PLAVIX) 75 MG tablet Take 75 mg by mouth daily.   12/09/2022 at 1800   cyanocobalamin 1000 MCG tablet Take 1,000 mcg by mouth daily.    12/09/2022   docusate sodium (COLACE) 100 MG capsule Take 1 capsule (100 mg total) by mouth daily.      famotidine (PEPCID) 20 MG tablet TAKE 1 TABLET BY MOUTH AT  BEDTIME 90 tablet 3 12/09/2022   finasteride (PROSCAR) 5 MG tablet TAKE 1 TABLET BY MOUTH  DAILY 90 tablet 3 12/09/2022   furosemide (LASIX) 20 MG tablet Take 1 tablet by mouth every other day.   12/09/2022   hydrALAZINE (APRESOLINE) 50 MG tablet Take 1 tablet (50 mg total) by mouth every 8 (eight) hours. 90 tablet 0 12/09/2022   isosorbide mononitrate (IMDUR) 60 MG 24 hr tablet Take 1 tablet (60 mg total) by mouth 2 (two) times daily. 60 tablet 1 12/09/2022   loratadine (CLARITIN) 10 MG tablet Take 1 tablet (10 mg total) by mouth daily as needed for allergies. 30 tablet  12/09/2022   Melatonin 10 MG TABS Take 1 tablet by mouth at bedtime as needed.   prn   metoprolol tartrate (LOPRESSOR) 50 MG tablet Take 0.5 tablets (25 mg total) by mouth 2 (two) times daily.   12/09/2022   nitroGLYCERIN (NITROSTAT) 0.4 MG SL tablet Place 1 tablet (0.4 mg total) under the tongue every 5 (five) minutes x 3 doses as needed for chest pain. 30 tablet 12 prn   pantoprazole (PROTONIX) 40 MG tablet Take 40 mg by mouth 2 (two) times daily.   12/09/2022   polyethylene glycol powder (GLYCOLAX/MIRALAX) 17 GM/SCOOP powder Take 8.5-17 g by mouth daily as needed for moderate constipation. 3350 g 0 prn   primidone (MYSOLINE) 50 MG tablet Take 2 tablets (100 mg total) by mouth 3 (three) times daily.   12/09/2022   rosuvastatin (CRESTOR) 20 MG tablet Take 20 mg by mouth at bedtime.   12/09/2022   Social History   Socioeconomic History   Marital status: Married    Spouse name: Not on file   Number of children: Not on file   Years of education: Not on file   Highest education level: Not on file  Occupational History   Occupation: retired Patent examiner, Network engineer, owns lawn care business  Tobacco Use   Smoking status: Former    Current packs/day: 0.00     Types: Pipe, Cigarettes    Quit date: 12/26/2000    Years since quitting: 21.9    Passive exposure: Past   Smokeless tobacco: Current    Types: Chew   Tobacco comments:    quit over 30 years ago  Vaping Use   Vaping status: Never Used  Substance and Sexual Activity   Alcohol use: Yes    Alcohol/week: 0.0 standard drinks of alcohol    Comment: Occasional beer   Drug use: No   Sexual activity: Not Currently  Other Topics Concern   Not on file  Social History Narrative   Widowed, remarried 07/17/2007.   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 [employer]    Activity: walks 1.5 miles 3d/wk   Diet: good water, fruits/vegetables daily   Social  Determinants of Health   Financial Resource Strain: Low Risk  (12/06/2022)   Received from [facility]   Overall Financial Resource Strain (CARDIA)    Difficulty of Paying Living Expenses: Not hard at all  Food Insecurity: No Food Insecurity (12/10/2022)   Hunger Vital Sign    Worried About Running Out of Food in the Last Year: Never true    Ran Out of Food in the Last Year: Never true  Transportation Needs: No Transportation Needs (12/10/2022)   PRAPARE - Administrator, Civil Service (Medical): No    Lack of Transportation (Non-Medical): No  Physical Activity: Not on file  Stress: Not on file  Social Connections: Not on file  Intimate Partner Violence: Not At Risk (12/10/2022)   Humiliation, Afraid, Rape, and Kick questionnaire    Fear of Current or Ex-Partner: No    Emotionally Abused: No    Physically Abused: No    Sexually Abused: No    Family History  Problem Relation Age of Onset   Hypertension Mother    Heart disease Brother 56       MI   Diabetes Brother    Cancer Daughter        melanoma   Stroke Neg Hx      Vitals:   12/12/22 0256 12/12/22 0607 12/12/22 0803 12/12/22 1211  BP: (!) 158/63 (!) 146/63  (!) 137/56 (!) 144/67  Pulse: 69 66 68 63  Resp: 18  16 16   Temp: 98.4 F (36.9 C)  99 F (37.2 C) 97.8 F (36.6 C)  TempSrc: Oral     SpO2: 99%  97% 92%  Weight:      Height:        PHYSICAL EXAM General: Well-appearing elderly male, well nourished, in no acute distress, laying nearly flat in hospital bed. HEENT: Normocephalic and atraumatic. Neck: No JVD.   Lungs: Normal respiratory effort on room air. Clear bilaterally to auscultation. No wheezes, crackles, rhonchi.  Heart: HRRR. Normal S1 and S2 without gallops or murmurs.  Abdomen: Non-distended appearing.  Msk: Normal strength and tone for age. Extremities: Warm and well perfused. No clubbing, cyanosis. No edema. Radial access sites without significant tenderness, bruising. Neuro: Alert and oriented X 3. Psych: Answers questions appropriately.   Labs: Basic Metabolic Panel: Recent Labs    12/11/22 0644 12/12/22 0246  NA 138 137  K 4.6 4.8  CL 110 107  CO2 22 22  GLUCOSE 96 104*  BUN 33* 29*  CREATININE 2.06* 1.85*  CALCIUM 8.6* 8.5*   Liver Function Tests: Recent Labs    12/09/22 1832  AST 15  ALT 8  ALKPHOS 53  BILITOT 0.7  PROT 6.5  ALBUMIN 4.2   Recent Labs    12/09/22 1832  LIPASE 27   CBC: Recent Labs    12/11/22 0644 12/12/22 0246  WBC 5.8 7.3  HGB 8.8* 9.8*  HCT 25.6* 28.4*  MCV 85.9 85.8  PLT 116* 133*   Cardiac Enzymes: Recent Labs    12/09/22 2021 12/10/22 0750 12/10/22 1400  TROPONINIHS 172* 2,561* 2,514*   BNP: No results for input(s): "BNP" in the last 72 hours. D-Dimer: No results for input(s): "DDIMER" in the last 72 hours. Hemoglobin A1C: No results for input(s): "HGBA1C" in the last 72 hours. Fasting Lipid Panel: No results for input(s): "CHOL", "HDL", "LDLCALC", "TRIG", "CHOLHDL", "LDLDIRECT" in the last 72 hours. Thyroid Function Tests: No results for input(s): "TSH", "T4TOTAL", "T3FREE", "THYROIDAB" in the last  72 hours.  Invalid input(s): "FREET3" Anemia  Panel: No results for input(s): "VITAMINB12", "FOLATE", "FERRITIN", "TIBC", "IRON", "RETICCTPCT" in the last 72 hours.   Radiology: CARDIAC CATHETERIZATION  Result Date: 12/11/2022   Dist LAD lesion is 50% stenosed.   Dist LM to Ost LAD lesion is 60% stenosed.   Dist RCA lesion is 65% stenosed.   1st Mrg lesion is 80% stenosed.   Mid Cx lesion is 70% stenosed.   1st Diag lesion is 75% stenosed.   Mid LAD lesion is 20% stenosed.   Non-stenotic Prox RCA lesion was previously treated.   The left ventricular systolic function is normal.   The left ventricular ejection fraction is 35-45% by visual estimate. 1.  NSTEMI 2.  Patent stent mid LAD, patent stent proximal RCA 3.  80% stenosis small caliber OM1, 70% stenosis small caliber mid left circumflex, 75% stenosis small caliber ostium D1, 60-70% stenosis distal RCA 4.  Mildly reduced left ventricular function 5.  No definitive culprit lesion Recommendations 1.  Medical therapy 2.  Continue dual antiplatelet therapy 3.  Continue aggressive risk factor modification 4.  Probable discharge home in a.m.   ECHOCARDIOGRAM COMPLETE  Result Date: 12/11/2022    ECHOCARDIOGRAM REPORT   Patient Name:   Shawn Andrews Date of Exam: 12/10/2022 Medical Rec #:  [id number]     Height:       70.0 in Accession #:    [id number]    Weight:       184.0 lb Date of Birth:  Dec 26, 1928     BSA:          2.015 m Patient Age:    89 years      BP:           128/49 mmHg Patient Gender: M             HR:           56 bpm. Exam Location:  [facility] Procedure: 2D Echo, Cardiac Doppler, Color Doppler and Strain Analysis Indications:     NSTEMI I21.4  History:         Patient has prior history of Echocardiogram examinations, most                  recent 03/26/2022. CHF; Risk Factors:Dyslipidemia and                  Hypertension.  Sonographer:     Cristela Blue Referring Phys:  [id number] [employer] Diagnosing Phys: Marcina Millard MD  Sonographer Comments: Global longitudinal strain was attempted.  IMPRESSIONS  1. Left ventricular ejection fraction, by estimation, is 45 to 50%. The left ventricle has mildly decreased function. The left ventricle demonstrates regional wall motion abnormalities (see scoring diagram/findings for description). Left ventricular diastolic parameters were normal.  2. Right ventricular systolic function is normal. The right ventricular size is normal.  3. The mitral valve is normal in structure. Mild mitral valve regurgitation. No evidence of mitral stenosis.  4. The aortic valve is normal in structure. Aortic valve regurgitation is trivial. No aortic stenosis is present.  5. The inferior vena cava is normal in size with greater than 50% respiratory variability, suggesting right atrial pressure of 3 mmHg. FINDINGS  Left Ventricle: Left ventricular ejection fraction, by estimation, is 45 to 50%. The left ventricle has mildly decreased function. The left ventricle demonstrates regional wall motion abnormalities. The left ventricular internal cavity size was normal in size. There is no left ventricular hypertrophy. Left  ventricular diastolic parameters were normal.  LV Wall Scoring: The apical lateral segment, apical septal segment, and apex are hypokinetic. Right Ventricle: The right ventricular size is normal. No increase in right ventricular wall thickness. Right ventricular systolic function is normal. Left Atrium: Left atrial size was normal in size. Right Atrium: Right atrial size was normal in size. Pericardium: There is no evidence of pericardial effusion. Mitral Valve: The mitral valve is normal in structure. Mild mitral valve regurgitation. No evidence of mitral valve stenosis. MV peak gradient, 4.6 mmHg. The mean mitral valve gradient is 2.0 mmHg. Tricuspid Valve: The tricuspid valve is normal in structure. Tricuspid valve regurgitation is mild . No evidence of tricuspid stenosis. Aortic Valve: The aortic valve is normal in structure. Aortic valve regurgitation is trivial. No  aortic stenosis is present. Aortic valve mean gradient measures 3.0 mmHg. Aortic valve peak gradient measures 5.5 mmHg. Aortic valve area, by VTI measures 2.99 cm. Pulmonic Valve: The pulmonic valve was normal in structure. Pulmonic valve regurgitation is not visualized. No evidence of pulmonic stenosis. Aorta: The aortic root is normal in size and structure. Venous: The inferior vena cava is normal in size with greater than 50% respiratory variability, suggesting right atrial pressure of 3 mmHg. IAS/Shunts: No atrial level shunt detected by color flow Doppler.  LEFT VENTRICLE PLAX 2D LVIDd:         5.50 cm      Diastology LVIDs:         3.80 cm      LV e' medial:    4.03 cm/s LV PW:         1.00 cm      LV E/e' medial:  23.7 LV IVS:        1.00 cm      LV e' lateral:   12.90 cm/s LVOT diam:     2.20 cm      LV E/e' lateral: 7.4 LV SV:         71 LV SV Index:   35 LVOT Area:     3.80 cm  LV Volumes (MOD) LV vol d, MOD A2C: 162.0 ml LV vol d, MOD A4C: 175.0 ml LV vol s, MOD A2C: 100.0 ml LV vol s, MOD A4C: 102.0 ml LV SV MOD A2C:     62.0 ml LV SV MOD A4C:     175.0 ml LV SV MOD BP:      69.5 ml RIGHT VENTRICLE RV Basal diam:  3.40 cm RV Mid diam:    2.10 cm RV S prime:     9.57 cm/s TAPSE (M-mode): 2.5 cm LEFT ATRIUM           Index        RIGHT ATRIUM           Index LA diam:      3.90 cm 1.94 cm/m   RA Area:     26.90 cm LA Vol (A2C): 55.3 ml 27.45 ml/m  RA Volume:   98.70 ml  48.99 ml/m LA Vol (A4C): 82.8 ml 41.10 ml/m  AORTIC VALVE AV Area (Vmax):    2.67 cm AV Area (Vmean):   2.75 cm AV Area (VTI):     2.99 cm AV Vmax:           117.00 cm/s AV Vmean:          73.900 cm/s AV VTI:            0.238 m AV Peak Grad:  5.5 mmHg AV Mean Grad:      3.0 mmHg LVOT Vmax:         82.20 cm/s LVOT Vmean:        53.400 cm/s LVOT VTI:          0.187 m LVOT/AV VTI ratio: 0.79  AORTA Ao Root diam: 3.10 cm MITRAL VALVE               TRICUSPID VALVE MV Area (PHT): 3.45 cm    TR Peak grad:   32.7 mmHg MV Area VTI:    2.09 cm    TR Vmax:        286.00 cm/s MV Peak grad:  4.6 mmHg MV Mean grad:  2.0 mmHg    SHUNTS MV Vmax:       1.07 m/s    Systemic VTI:  0.19 m MV Vmean:      60.9 cm/s   Systemic Diam: 2.20 cm MV Decel Time: 220 msec MV E velocity: 95.40 cm/s MV A velocity: 77.60 cm/s MV E/A ratio:  1.23 Marcina Millard MD Electronically signed by Marcina Millard MD Signature Date/Time: 12/11/2022/12:18:03 PM    Final    DG Chest 2 View  Result Date: 12/09/2022 CLINICAL DATA:  chest pain Pt was eating soup at 5p when he developed CP. Took two nitroglycerin without relief. Current pain is 8/10. Hx of two MIs EXAM: CHEST - 2 VIEW COMPARISON:  Chest x-ray 08/10/2022, CT chest 03/25/2022 FINDINGS: Slightly widened appearing mediastinum due to patient rotation. The heart and mediastinal contours are unchanged. Atherosclerotic plaque. Coronary artery stent noted. No focal consolidation. No pulmonary edema. Possible trace right pleural effusion. No pneumothorax. No acute osseous abnormality. IMPRESSION: Possible trace right pleural effusion. Electronically Signed   By: Tish Frederickson M.D.   On: 12/09/2022 21:23    NM Myocardial Perfusion SPECT multiple: 01/08/2022 Abnormal myocardial perfusion scan left ventricular enlargement globally depressed ejection fraction of 35% there is a moderate to large area of anterior apical persistent defect consistent with scar there is also RV uptake consistent with systolic cardiomyopathy.  No clear evidence of reversible ischemia. Conclusion this is a high risk study   Most recent cardiac caths:  03/27/2022   Mid LAD lesion is 95% stenosed.   Prox RCA lesion is 75% stenosed.   Dist LM to Ost LAD lesion is 60% stenosed.   Dist LAD lesion is 50% stenosed.   A drug-eluting stent was successfully placed using a STENT ONYX FRONTIER 3.5X15.   Post intervention, there is a 0% residual stenosis.   There is moderate left ventricular systolic dysfunction.   LV end diastolic  pressure is mildly elevated.   The left ventricular ejection fraction is 35-45% by visual estimate.  03/29/2022   Mid LAD lesion is 95% stenosed.   Dist LAD lesion is 50% stenosed.   Dist LM to Ost LAD lesion is 60% stenosed.   Non-stenotic Prox RCA lesion was previously treated.   A stent was successfully placed.   Post intervention, there is a 0% residual stenosis.  ECHO 03/26/2022  1. Ant/Apical hypo.   2. Left ventricular ejection fraction, by estimation, is 30 to 35%. The left ventricle has moderately decreased function. The left ventricle demonstrates regional wall motion abnormalities (see scoring diagram/findings for description). The left ventricular internal cavity size was moderately dilated. There is mild concentric left ventricular hypertrophy. Left ventricular diastolic parameters are consistent with Grade I diastolic dysfunction (impaired  relaxation).   3. Right ventricular systolic  function is low normal. The right ventricular size is mildly enlarged. Mildly increased right ventricular wall thickness. There is severely elevated pulmonary artery systolic pressure.   4. Left atrial size was moderately dilated.   5. Right atrial size was mildly dilated.   6. The mitral valve is normal in structure. Mild mitral valve regurgitation.   7. Tricuspid valve regurgitation is mild to moderate.   8. The aortic valve is grossly normal. Aortic valve regurgitation is not visualized.   TELEMETRY reviewed by me 12/12/2022: Sinus rhythm, rate 60s  EKG reviewed by me: NSR with non specific ST-T changes, rate 92 bpm.   Data reviewed by me 12/12/2022: last 24h vitals tele labs imaging I/O hospitalist progress note  Principal Problem:   NSTEMI (non-ST elevated myocardial infarction) [facility]) Active Problems:   Essential hypertension   Essential tremor   Chronic kidney disease, stage 3b (HCC)   Leg DVT (deep venous thromboembolism), acute, left (HCC)   CAD (coronary artery disease)   HFrEF  (heart failure with reduced ejection fraction) (HCC)   Suprapubic catheter (HCC)   Dyslipidemia   BPH (benign prostatic hyperplasia)    ASSESSMENT AND PLAN:  ROMULUS CRAFTS is a 89 y.o. male  with a past medical history of coronary artery disease, NSTEMI s/p two overlapping DES stent to proximal/mid LAD (09/2020), stent to proximal RCA and stent to in-stent restenosis of mid LAD (03/2022), chronic HFrEF (EF 30-35% 03/3022), hypertension, hyperlipidemia, DVT, CKD stage 3a, BPH who presented to the ED on 12/09/2022 for chest pain. Cardiology was consulted for further evaluation.   # NSTEMI, type 1 # Coronary artery disease s/p 3 stents (mid/proximal LAD, proximal RCA) # Chronic HFrEF (EF 30-35% - 03/2022) Patient presented to ED with "sharp" diffuse chest pain across chest that occurred at rest with no associated shortness of breath. Troponin elevated and trending 23 > 172 > 2,561 > 2514. EKG in ED showed NSR with non specific ST-T changes, rate 92 bpm. S/p ASA 324 mg. Patient started on heparin gtt in the ED. No recurrence of CP. -Continue aspirin 81 mg daily and plavix 75 mg daily. -Continue home Imdur 60 mg BID, metoprolol succinate 25 mg BID, hydralazine 50 mg q8hrs. Can consider uptitration of imdur vs metoprolol at follow up.  -Recommend patient follows up with GI provider.  # Hypertension # Hyperlipidemia -Continue home rosuvastatin 20 mg daily.  -Home BP medications as above.   # CKD stage 3a # Anemia Upon arrival to ED, Cr 1.79 (at baseline) with stable Na and K. Cr up to 2.06 yesterday improved to 1.85 today. Hgb dropped from 10.7 to 8.4 on admission now stable at 8.8. -Continue to monitor renal function.   Ok for discharge today from a cardiac perspective. Will arrange for follow up in clinic with Dr. Juliann Pares in 1-2 weeks.    This patient's plan of care was discussed and created with Dr. Darrold Junker and he is in agreement.  Signed: Gale Journey, PA-C  12/12/2022, 1:52  PM [facility]

## 2022-12-12 NOTE — Hospital Course (Addendum)
HPI: Shawn Andrews is a 89 y.o. Caucasian male with medical history significant for NSTEMI s/p two overlapping DES stent to proximal/mid LAD (09/2020), stent to proximal RCA and stent to in-stent restenosis of mid LAD (03/2022), chronic HFrEF (EF 30-35% 03/3022), hypertension, hyperlipidemia, DVT, CKD stage 3a, BPH, essential tremors, and osteoarthritis, who presented to the emergency room with acute onset of chest pain   Hospital course / significant events:  11/18: Troponin 23 --> 172. Initial EKG showed normal sinus rhythm with rate of 92 with left axis deviation and LVH with QRS widening and with poor R wave progression. Repeat EKG showed sinus rhythm with a rate of 70 with PACs and borderline prolonged PR interval with nonspecific intraventricular conduction delay and Q waves anteroseptally with T wave inversion laterally. Admitted to hospitalist service for NSTEMI, HFrEF 11/19: remain on heparin gtt pending cath 11/20: LHC no definitive culprit lesion, medical management   11/21: weakness but improving, one more day PT/OT given O2 requirement   Consultants:  Cardiology   Procedures/Surgeries: 12/11/22: L heart catheterization - Dr Darrold Junker       ASSESSMENT & PLAN:   NSTEMI Type 1 Coronary artery disease s/p 3 stents (mid/proximal LAD, proximal RCA)  HTN [facility] cardiology following - ok for discharge from their perspective  maintain tele Continue aspirin 81 mg daily and plavix 75 mg daily. Continue home Imdur 60 mg BID, metoprolol succinate 25 mg BID, hydralazine 50 mg q8hrs. Recs follow w/ GI outpatient    Chronic HFrEF (EF 30-35% - 03/2022)  Appears euvolemic cont home lasix, imdur  Acute hypoxic respiratory failure  Supplemental O2 to wean as able, expect will be off by tomorrow and ok for discharge   CKD 3b Gfr is at baseline Monitor BMP  BPH Cont home finasteride   Suprapubic catheter 2/2 bph, chronic catheter care.    History provoked DVT Not currently  anticoagulated   Tremor home primidone   Louellen Molder, NP  [address] [facility]- [facility]  [place], [place] [address]  [phone] (Work)  [phone] (Fax)     overweight based on BMI: Body mass index is 25.31 kg/m.  Underweight - under 18.5  normal weight - 18.5 to 24.9 overweight - 25 to 29.9 obese - 30 or more   DVT prophylaxis: heparin  IV fluids: no continuous IV fluids  Nutrition: cardiac diet Central lines / invasive devices: none  Code Status: FULL CODE ACP documentation reviewed: 12/12/22 and has Living Will on file in [facility]  TOC needs: none Barriers to dispo / significant pending items: O2 requirement, anticipate d/c tomorrow

## 2022-12-12 NOTE — Progress Notes (Signed)
Suprapubic Cath Change  Patient is present today for a suprapubic catheter change due to urinary retention.  8 ml of water was drained from the balloon, a 16 FR foley cath was removed from the tract with out difficulty.  Site was cleaned and prepped in a sterile fashion with betadine.  A 16 FR foley cath was replaced into the tract no complications were noted. Urine return was noted, 10 ml of sterile water was inflated into the balloon and a leg bag was attached for drainage.  Patient tolerated well.  Performed by: Michiel Cowboy, PA-C   Follow up:  He is currently on Cipro since being discharged from the hospital and will finish the antibiotic on Thanksgiving evening.  This was given to him as they discovered a UTI during his hospital stay.  We will schedule return appointment in 2 weeks so that we can recheck the urine.  They will cancel the appointment if they feel the urine has been clear and there has been no signs of infection and then schedule their 6-week SPT exchange.

## 2022-12-12 NOTE — Evaluation (Signed)
Physical Therapy Evaluation Patient Details Name: Shawn Andrews MRN: [id number] DOB: 24-Sep-1928 Today's Date: 12/12/2022  History of Present Illness  Pt is a 89 y.o. male  with a past medical history of coronary artery disease, NSTEMI s/p two overlapping DES stent to proximal/mid LAD (09/2020), stent to proximal RCA and stent to in-stent restenosis of mid LAD (03/2022), chronic HFrEF (EF 30-35% 03/3022), hypertension, hyperlipidemia, DVT, CKD stage 3a, BPH who presented to the ED on 12/09/2022 for chest pain.   Clinical Impression  Patient alert, oriented x4, reported no pain throughout session. Per pt at baseline he is independent, lives with his wife and goes to the gym regularly. Pt educated on NWB status of LUE due to heart cath and had difficulty with this concept throughout evaluation, consistent cues needed. Supine to sit modI, and good sitting balance noted, able to don socks but SOB noted. spO2 on room air at rest >90% but with mobility/ambulation dropped to 86% so 2L via Okay on throughout rest of mobility. Sit <> stand without AD, and with RW, without AD minA to correct 2 LOB to keep pt from falling. He ambulated ~229ft with RW and CGA.  Overall the patient demonstrated deficits (see "PT Problem List") that impede the patient's functional abilities, safety, and mobility and would benefit from skilled PT intervention.          If plan is discharge home, recommend the following: A little help with bathing/dressing/bathroom;Assistance with cooking/housework;Assist for transportation;Help with stairs or ramp for entrance;Direct supervision/assist for medications management   Can travel by private vehicle        Equipment Recommendations Rolling walker (2 wheels)  Recommendations for Other Services       Functional Status Assessment Patient has had a recent decline in their functional status and demonstrates the ability to make significant improvements in function in a reasonable and  predictable amount of time.     Precautions / Restrictions Precautions Precautions: Fall Restrictions Weight Bearing Restrictions: No      Mobility  Bed Mobility Overal bed mobility: Needs Assistance Bed Mobility: Supine to Sit     Supine to sit: Modified independent (Device/Increase time)          Transfers Overall transfer level: Needs assistance Equipment used: Rolling walker (2 wheels), None Transfers: Sit to/from Stand             General transfer comment: a bit impulsive but able to redirect and educate    Ambulation/Gait Ambulation/Gait assistance: Contact guard assist Gait Distance (Feet): 220 Feet Assistive device: Rolling walker (2 wheels)         General Gait Details: CGA for hallway amulation with cues to limit LUE due to heart cath  Stairs            Wheelchair Mobility     Tilt Bed    Modified Rankin (Stroke Patients Only)       Balance Overall balance assessment: Needs assistance Sitting-balance support: Feet supported Sitting balance-Leahy Scale: Good Sitting balance - Comments: able to don socks but did make him SOB   Standing balance support: During functional activity, Bilateral upper extremity supported Standing balance-Leahy Scale: Poor Standing balance comment: poor without BUE support, BUE support with ambulation                             Pertinent Vitals/Pain Pain Assessment Pain Assessment: No/denies pain    Home Living Family/patient expects to be discharged  to:: Private residence Living Arrangements: Spouse/significant other Available Help at Discharge: Family;Available 24 hours/day Type of Home: House Home Access: Level entry       Home Layout: One level Home Equipment: Toilet riser;Cane - single point;Rollator (4 wheels)      Prior Function Prior Level of Function : Independent/Modified Independent             Mobility Comments: [facility]  cardio and strength training ADLs Comments: independent     Extremity/Trunk Assessment   Upper Extremity Assessment Upper Extremity Assessment: Defer to OT evaluation;Left hand dominant    Lower Extremity Assessment Lower Extremity Assessment: Overall WFL for tasks assessed    Cervical / Trunk Assessment Cervical / Trunk Assessment: Normal  Communication      Cognition Arousal: Alert Behavior During Therapy: WFL for tasks assessed/performed Overall Cognitive Status: Within Functional Limits for tasks assessed                                          General Comments      Exercises     Assessment/Plan    PT Assessment Patient needs continued PT services  PT Problem List Decreased strength;Decreased range of motion;Decreased activity tolerance;Decreased balance;Decreased mobility;Decreased safety awareness;Decreased knowledge of precautions       PT Treatment Interventions DME instruction;Neuromuscular re-education;Gait training;Stair training;Therapeutic activities;Functional mobility training;Patient/family education;Therapeutic exercise;Balance training    PT Goals (Current goals can be found in the Care Plan section)  Acute Rehab PT Goals Patient Stated Goal: to go home PT Goal Formulation: With patient Time For Goal Achievement: 12/26/22 Potential to Achieve Goals: Good    Frequency Min 1X/week     Co-evaluation               AM-PAC PT "6 Clicks" Mobility  Outcome Measure Help needed turning from your back to your side while in a flat bed without using bedrails?: A Little Help needed moving from lying on your back to sitting on the side of a flat bed without using bedrails?: A Little Help needed moving to and from a bed to a chair (including a wheelchair)?: A Little Help needed standing up from a chair using your arms (e.g., wheelchair or bedside chair)?: A Little Help needed to walk in hospital room?: A Little Help needed climbing  3-5 steps with a railing? : A Little 6 Click Score: 18    End of Session Equipment Utilized During Treatment: Gait belt Activity Tolerance: Patient tolerated treatment well Patient left: with call bell/phone within reach;with chair alarm set;in chair Nurse Communication: Mobility status PT Visit Diagnosis: Other abnormalities of gait and mobility (R26.89);Muscle weakness (generalized) (M62.81);Difficulty in walking, not elsewhere classified (R26.2)    Time: 1478-2956 PT Time Calculation (min) (ACUTE ONLY): 29 min   Charges:   PT Evaluation $PT Eval Low Complexity: 1 Low PT Treatments $Therapeutic Activity: 23-37 mins PT General Charges $$ ACUTE PT VISIT: 1 Visit         Olga Coaster PT, DPT 2:37 PM,12/12/22

## 2022-12-12 NOTE — TOC CM/SW Note (Signed)
     [facility]ILIT[facility] SERVICES REFERRAL        Occupational Therapy * Physical Therapy * Speech Therapy                           DATE   12/12/2022  PATIENT NAME Shawn Andrews PATIENT MRN [id number]       DIAGNOSIS/DIAGNOSIS CODE I21.4, I10, G25.0, N18.32, I82.402, I25.10, I50.20, Z93.59, E78.5, N40.0  DATE OF DISCHARGE: Likely 12/13/2022       PRIMARY CARE PHYSICIAN    Clydie Braun, MD PCP [facility] [phone]     Dear Provider (Name: [facility]  Fax: [phone]   I certify that I have examined this patient and that occupational/physical/speech therapy is necessary on an outpatient basis.    The patient has expressed interest in completing their recommended course of therapy at your  location.  Once a formal order from the patient's primary care physician has been obtained, please  contact him/her to schedule an appointment for evaluation at your earliest convenience.   [ x]  Physical Therapy Evaluate and Treat  [  ]  Occupational Therapy Evaluate and Treat  [  ]  Speech Therapy Evaluate and Treat         The patient's primary care physician (listed above) must furnish and be responsible for a formal order such that the recommended services may be furnished while under the primary physician's care, and that the plan of care will be established and reviewed every 30 days (or more often if condition necessitates).

## 2022-12-12 NOTE — TOC Initial Note (Signed)
Transition of Care [facility]) - Initial/Assessment Note    Patient Details  Name: Shawn Andrews MRN: [id number] Date of Birth: 1928-12-11  Transition of Care [facility]) CM/SW Contact:    Margarito Liner, LCSW Phone Number: 12/12/2022, 4:24 PM  Clinical Narrative:   CSW met with patient. Wife at bedside. CSW introduced role and explained that PT recommendations would be discussed. Wife prefers cardiac rehab rather than home health. Patient has had cardiac rehab in the past. CSW sent secure chat to heart failure nurse navigator. They are also agreeable to outpatient PT here at the hospital. CSW asked the physician to cosign the order form so CSW can fax it. Wife declined RW. Patient has a rollator, canes, shower seat, elevated toilet, and grab bars in the bathroom. No further concerns. CSW encouraged patient and his wife to contact CSW as needed. CSW will continue to follow patient and his wife for support and facilitate return home at discharge. Wife and son will take him home at discharge.               Expected Discharge Plan: OP Rehab Barriers to Discharge: Continued Medical Work up   Patient Goals and CMS Choice     Choice offered to / list presented to : Patient, Spouse      Expected Discharge Plan and Services     Post Acute Care Choice:  (Outpatient therapy) Living arrangements for the past 2 months: Single Family Home                                      Prior Living Arrangements/Services Living arrangements for the past 2 months: Single Family Home Lives with:: Spouse Patient language and need for interpreter reviewed:: Yes Do you feel safe going back to the place where you live?: Yes      Need for Family Participation in Patient Care: Yes (Comment) Care giver support system in place?: Yes (comment) Current home services: DME Criminal Activity/Legal Involvement Pertinent to Current Situation/Hospitalization: No - Comment as needed  Activities of Daily Living   ADL  Screening (condition at time of admission) Independently performs ADLs?: Yes (appropriate for developmental age) Is the patient deaf or have difficulty hearing?: No Does the patient have difficulty seeing, even when wearing glasses/contacts?: No Does the patient have difficulty concentrating, remembering, or making decisions?: No  Permission Sought/Granted Permission sought to share information with : Family Supports    Share Information with NAME: Carnella Guadalajara     Permission granted to share info w Relationship: Wife  Permission granted to share info w Contact Information: [phone]  Emotional Assessment Appearance:: Appears stated age Attitude/Demeanor/Rapport: Engaged, Gracious Affect (typically observed): Accepting, Appropriate, Calm, Pleasant Orientation: : Oriented to Self, Oriented to Place, Oriented to  Time, Oriented to Situation Alcohol / Substance Use: Not Applicable Psych Involvement: No (comment)  Admission diagnosis:  NSTEMI (non-ST elevated myocardial infarction) (HCC) [I21.4] Patient Active Problem List   Diagnosis Date Noted   Dyslipidemia 12/09/2022   BPH (benign prostatic hyperplasia) 12/09/2022   Frequent PVCs 03/25/2022   Overweight (BMI 25.0-29.9) 10/04/2021   Generalized abdominal pain    Lower abdominal pain 02/26/2021   UTI (urinary tract infection) 02/25/2021   Suprapubic catheter (HCC) 02/25/2021   CKD (chronic kidney disease), stage IIIa 01/17/2021   Chronic systolic CHF (congestive heart failure) (HCC) 01/17/2021   Pleural effusion 01/17/2021   Acute on chronic systolic CHF (  congestive heart failure) (HCC) 01/17/2021   Constipation 12/13/2020   Hyponatremia 12/13/2020   NSTEMI (non-ST elevated myocardial infarction) (HCC) 10/16/2020   HFrEF (heart failure with reduced ejection fraction) (HCC) 10/16/2020   CAD (coronary artery disease) 10/08/2020   Elevated vitamin B12 level 08/31/2020   Leg DVT (deep venous thromboembolism), acute, left (HCC)  08/09/2019   Skin lesion 07/18/2016   Health maintenance examination 07/19/2015   Advanced care planning/counseling discussion 07/05/2014   Memory deficit 07/05/2014   Chronic kidney disease, stage 3b (HCC) 06/23/2013   Post-nasal drainage 12/22/2012   Medicare annual wellness visit, subsequent 06/15/2012   Lumbosacral radiculopathy at L5 06/17/2011   High frequency hearing loss 10/18/2010   Vitamin D deficiency 11/15/2008   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 [place] Rough, [place] - [address] AT [facility] [address] [place] [address] Phone: [phone] Fax: [phone]  [facility]) - [place], [place] - [address] [place] [address] Phone: [phone] Fax: [phone]  [facility] - [place], [place] - [address] [place] [address] Phone: [phone] Fax: [phone]     Social Determinants of Health (SDOH) Social History: SDOH Screenings   Food Insecurity: No Food Insecurity (12/10/2022)  Housing: Low Risk  (12/10/2022)  Transportation Needs: No Transportation Needs (12/10/2022)  Utilities: Not At Risk (12/10/2022)  Depression (PHQ2-9): Low Risk  (07/17/2022)  Financial Resource Strain: Low Risk  (12/06/2022)   Received from [facility]  Tobacco Use: High Risk (12/10/2022)   SDOH Interventions:     Readmission Risk Interventions     No data to display

## 2022-12-12 NOTE — Evaluation (Signed)
Occupational Therapy Evaluation Patient Details Name: FURIOUS WRITER MRN: [id number] DOB: September 20, 1928 Today's Date: 12/12/2022   History of Present Illness Pt is a 89 y.o. male  with a past medical history of coronary artery disease, NSTEMI s/p two overlapping DES stent to proximal/mid LAD (09/2020), stent to proximal RCA and stent to in-stent restenosis of mid LAD (03/2022), chronic HFrEF (EF 30-35% 03/3022), hypertension, hyperlipidemia, DVT, CKD stage 3a, BPH who presented to the ED on 12/09/2022 for chest pain.   Clinical Impression   Mr. Dorgan was seen for OT evaluation this date. Prior to hospital admission, pt was independent in all aspects of ADL/IADL, and denies falls history in past 12 months. Pt lives with his spouse in a 1 story home with a level entry. Currently pt reporting symptoms have generally resolved. Pt demonstrates baseline independence to perform ADL and mobility tasks and no strength, sensory, coordination, cognitive, or visual deficits appreciated with assessment. No skilled OT needs identified. Will sign off. Please re-consult if additional OT needs arise during this hospital stay.        If plan is discharge home, recommend the following: Assist for transportation;Assistance with cooking/housework;Help with stairs or ramp for entrance    Functional Status Assessment  Patient has not had a recent decline in their functional status  Equipment Recommendations  None recommended by OT    Recommendations for Other Services       Precautions / Restrictions Precautions Precautions: Fall Restrictions Weight Bearing Restrictions: No      Mobility Bed Mobility               General bed mobility comments: NT in recliner at start/end of session    Transfers Overall transfer level: Needs assistance Equipment used: Rolling walker (2 wheels), None Transfers: Sit to/from Stand Sit to Stand: Modified independent (Device/Increase time)           General  transfer comment: a bit impulsive but able to redirect and educate      Balance Overall balance assessment: Needs assistance Sitting-balance support: Feet supported Sitting balance-Leahy Scale: Good     Standing balance support: No upper extremity supported, Bilateral upper extremity supported, During functional activity Standing balance-Leahy Scale: Good                             ADL either performed or assessed with clinical judgement   ADL Overall ADL's : At baseline                                       General ADL Comments: Pt presents at or near baseline level of functional independence for ADL management. He reports feeling slightly weaker than usual but has been able to get to his room commode independently, put on B shoes/socks, and denies any additional functional deficits. He adhered well to his LUE broken wing precautions during session. He was able to walk 1 lap around the nsg unit on RA initially using the RW but was able to progress to independent amb. He was noted to desat to 87-88% after this activity but rebounded to >/=94% in ~1 min with cues for pursed lipped breathing.     Vision Patient Visual Report: No change from baseline       Perception         Praxis  Pertinent Vitals/Pain Pain Assessment Pain Assessment: No/denies pain     Extremity/Trunk Assessment Upper Extremity Assessment Upper Extremity Assessment: Overall WFL for tasks assessed;Left hand dominant   Lower Extremity Assessment Lower Extremity Assessment: Overall WFL for tasks assessed   Cervical / Trunk Assessment Cervical / Trunk Assessment: Normal   Communication Communication Cueing Techniques: Verbal cues   Cognition Arousal: Alert Behavior During Therapy: WFL for tasks assessed/performed Overall Cognitive Status: Within Functional Limits for tasks assessed                                 General Comments: Pleasant,  conversational.     General Comments       Exercises Other Exercises Other Exercises: Pt/caregiver educated on role of OT in acute setting, energy conservation strategies, and falls prevention strategies for home and hospital. Both return verablize understanding.   Shoulder Instructions      Home Living Family/patient expects to be discharged to:: Private residence Living Arrangements: Spouse/significant other Available Help at Discharge: Family;Available 24 hours/day Type of Home: House Home Access: Level entry     Home Layout: One level     Bathroom Shower/Tub: Producer, television/film/video: Standard     Home Equipment: Toilet riser;Cane - single point;Rollator (4 wheels)          Prior Functioning/Environment Prior Level of Function : Independent/Modified Independent             Mobility Comments: independent[facility] cardio and strength training ADLs Comments: independent, denies falls history in last 6 months.        OT Problem List: Cardiopulmonary status limiting activity;Impaired balance (sitting and/or standing);Decreased activity tolerance      OT Treatment/Interventions:      OT Goals(Current goals can be found in the care plan section) Acute Rehab OT Goals Patient Stated Goal: To go home OT Goal Formulation: All assessment and education complete, DC therapy Time For Goal Achievement: 12/12/22 Potential to Achieve Goals: Good  OT Frequency:      Co-evaluation              AM-PAC OT "6 Clicks" Daily Activity     Outcome Measure Help from another person eating meals?: None Help from another person taking care of personal grooming?: None Help from another person toileting, which includes using toliet, bedpan, or urinal?: None Help from another person bathing (including washing, rinsing, drying)?: None Help from another person to put on and taking off regular upper body clothing?: None Help from another  person to put on and taking off regular lower body clothing?: None 6 Click Score: 24   End of Session Equipment Utilized During Treatment: Gait belt;Rolling walker (2 wheels)  Activity Tolerance: Patient tolerated treatment well Patient left: in chair;with call bell/phone within reach;with chair alarm set;with family/visitor present  OT Visit Diagnosis: Other abnormalities of gait and mobility (R26.89)                Time: 1610-9604 OT Time Calculation (min): 17 min Charges:  OT General Charges $OT Visit: 1 Visit OT Evaluation $OT Eval Moderate Complexity: 1 Mod OT Treatments $Self Care/Home Management : 8-22 mins  Rockney Ghee, M.S., OTR/L 12/12/22, 3:18 PM

## 2022-12-12 NOTE — Progress Notes (Signed)
PROGRESS NOTE    Shawn Andrews   NWG:[id number] DOB: April 28, 1928  DOA: 12/09/2022 Date of Service: 12/12/22 which is hospital day 2  PCP: Mick Sell, MD    HPI: Shawn Andrews is a 89 y.o. Caucasian male with medical history significant for NSTEMI s/p two overlapping DES stent to proximal/mid LAD (09/2020), stent to proximal RCA and stent to in-stent restenosis of mid LAD (03/2022), chronic HFrEF (EF 30-35% 03/3022), hypertension, hyperlipidemia, DVT, CKD stage 3a, BPH, essential tremors, and osteoarthritis, who presented to the emergency room with acute onset of chest pain   Hospital course / significant events:  11/18: Troponin 23 --> 172. Initial EKG showed normal sinus rhythm with rate of 92 with left axis deviation and LVH with QRS widening and with poor R wave progression. Repeat EKG showed sinus rhythm with a rate of 70 with PACs and borderline prolonged PR interval with nonspecific intraventricular conduction delay and Q waves anteroseptally with T wave inversion laterally. Admitted to hospitalist service for NSTEMI, HFrEF 11/19: remain on heparin gtt pending cath 11/20: LHC no definitive culprit lesion, medical management   11/21: weakness but improving, one more day PT/OT given O2 requirement   Consultants:  Cardiology   Procedures/Surgeries: 12/11/22: L heart catheterization - Dr Darrold Junker       ASSESSMENT & PLAN:   NSTEMI Type 1 Coronary artery disease s/p 3 stents (mid/proximal LAD, proximal RCA)  HTN HLD cardiology following - ok for discharge from their perspective  maintain tele Continue aspirin 81 mg daily and plavix 75 mg daily. Continue home Imdur 60 mg BID, metoprolol succinate 25 mg BID, hydralazine 50 mg q8hrs. Recs follow w/ GI outpatient    Chronic HFrEF (EF 30-35% - 03/2022)  Appears euvolemic cont home lasix, imdur  Acute hypoxic respiratory failure  Supplemental O2 to wean as able, expect will be off by tomorrow and ok for discharge    CKD 3b Gfr is at baseline Monitor BMP  BPH Cont home finasteride   Suprapubic catheter 2/2 bph, chronic catheter care.    History provoked DVT Not currently anticoagulated   Tremor home primidone   Louellen Molder, NP  [address] [facility]  [place], [place] [address]  [phone] (Work)  [phone] (Fax)     overweight based on BMI: Body mass index is 25.31 kg/m.  Underweight - under 18.5  normal weight - 18.5 to 24.9 overweight - 25 to 29.9 obese - 30 or more   DVT prophylaxis: heparin  IV fluids: no continuous IV fluids  Nutrition: cardiac diet Central lines / invasive devices: none  Code Status: FULL CODE ACP documentation reviewed: 12/12/22 and has Living Will on file in [facility]  TOC needs: none Barriers to dispo / significant pending items: O2 requirement, anticipate d/c tomorrow              Subjective / Brief ROS:  Patient reports feeling a bit weak and easily winded but otherwise okay today  Denies CP/SOB at rest  Pain controlled.  Denies new weakness.  Tolerating diet.  Reports no concerns w/ urination/defecation.   Family Communication: wife and son at bedside on rounds     Objective Findings:  Vitals:   12/12/22 0256 12/12/22 0607 12/12/22 0803 12/12/22 1211  BP: (!) 158/63 (!) 146/63 (!) 137/56 (!) 144/67  Pulse: 69 66 68 63  Resp: 18  16 16   Temp: 98.4 F (36.9 C)  99 F (37.2 C) 97.8 F (36.6 C)  TempSrc: Oral     SpO2: 99%  97% 92%  Weight:      Height:        Intake/Output Summary (Last 24 hours) at 12/12/2022 1550 Last data filed at 12/12/2022 1212 Gross per 24 hour  Intake --  Output 2400 ml  Net -2400 ml   Filed Weights   12/09/22 2055 12/10/22 1556 12/11/22 0409  Weight: 83.5 kg 82.1 kg 80 kg    Examination:  Physical Exam Constitutional:      General: He is not in acute distress. Cardiovascular:     Rate and Rhythm: Normal rate and regular rhythm.   Pulmonary:     Effort: Pulmonary effort is normal. No accessory muscle usage.     Breath sounds: Normal breath sounds.  Abdominal:     Palpations: Abdomen is soft.  Musculoskeletal:     Right lower leg: No edema.     Left lower leg: No edema.  Neurological:     General: No focal deficit present.     Mental Status: He is alert.  Psychiatric:        Mood and Affect: Mood normal.        Behavior: Behavior normal.          Scheduled Medications:   cholecalciferol  1,000 Units Oral Daily   clopidogrel  75 mg Oral Daily   cyanocobalamin  1,000 mcg Oral Daily   docusate sodium  100 mg Oral Daily   famotidine  20 mg Oral QHS   finasteride  5 mg Oral Daily   furosemide  20 mg Oral QODAY   heparin injection (subcutaneous)  5,000 Units Subcutaneous Q8H   hydrALAZINE  50 mg Oral Q8H   isosorbide mononitrate  60 mg Oral BID   metoprolol succinate  25 mg Oral BID   pantoprazole  40 mg Oral BID   primidone  100 mg Oral TID   rosuvastatin  20 mg Oral QHS    Continuous Infusions:  sodium chloride      PRN Medications:  sodium chloride, acetaminophen, ALPRAZolam, alum & mag hydroxide-simeth, loratadine, magnesium hydroxide, melatonin, nitroGLYCERIN, ondansetron (ZOFRAN) IV, traZODone  Antimicrobials from admission:  Anti-infectives (From admission, onward)    None           Data Reviewed:  I have personally reviewed the following...  CBC: Recent Labs  Lab 12/09/22 1832 12/10/22 0750 12/11/22 0644 12/12/22 0246  WBC 7.8 4.8 5.8 7.3  HGB 10.7* 8.4* 8.8* 9.8*  HCT 31.8* 24.9* 25.6* 28.4*  MCV 90.1 88.6 85.9 85.8  PLT 131* 111* 116* 133*   Basic Metabolic Panel: Recent Labs  Lab 12/09/22 1832 12/11/22 0644 12/12/22 0246  NA 135 138 137  K 4.3 4.6 4.8  CL 106 110 107  CO2 20* 22 22  GLUCOSE 179* 96 104*  BUN 35* 33* 29*  CREATININE 1.79* 2.06* 1.85*  CALCIUM 8.7* 8.6* 8.5*   GFR: Estimated Creatinine Clearance: 26.3 mL/min (A) (by C-G formula based  on SCr of 1.85 mg/dL (H)). Liver Function Tests: Recent Labs  Lab 12/09/22 1832  AST 15  ALT 8  ALKPHOS 53  BILITOT 0.7  PROT 6.5  ALBUMIN 4.2   Recent Labs  Lab 12/09/22 1832  LIPASE 27   No results for input(s): "AMMONIA" in the last 168 hours. Coagulation Profile: Recent Labs  Lab 12/10/22 0623  INR 1.3*   Cardiac Enzymes: No results for input(s): "CKTOTAL", "CKMB", "CKMBINDEX", "TROPONINI" in the last 168 hours. BNP (last 3  results) No results for input(s): "PROBNP" in the last 8760 hours. HbA1C: No results for input(s): "HGBA1C" in the last 72 hours. CBG: No results for input(s): "GLUCAP" in the last 168 hours. Lipid Profile: No results for input(s): "CHOL", "HDL", "LDLCALC", "TRIG", "CHOLHDL", "LDLDIRECT" in the last 72 hours. Thyroid Function Tests: No results for input(s): "TSH", "T4TOTAL", "FREET4", "T3FREE", "THYROIDAB" in the last 72 hours. Anemia Panel: No results for input(s): "VITAMINB12", "FOLATE", "FERRITIN", "TIBC", "IRON", "RETICCTPCT" in the last 72 hours. Most Recent Urinalysis On File:     Component Value Date/Time   COLORURINE AMBER (A) 08/10/2022 0900   APPEARANCEUR Hazy (A) 08/20/2022 1350   LABSPEC 1.017 08/10/2022 0900   PHURINE 5.0 08/10/2022 0900   GLUCOSEU Negative 08/20/2022 1350   GLUCOSEU NEGATIVE 12/25/2020 1556   HGBUR SMALL (A) 08/10/2022 0900   BILIRUBINUR Negative 08/20/2022 1350   KETONESUR NEGATIVE 08/10/2022 0900   PROTEINUR Negative 08/20/2022 1350   PROTEINUR 100 (A) 08/10/2022 0900   UROBILINOGEN 0.2 12/25/2020 1556   NITRITE Negative 08/20/2022 1350   NITRITE POSITIVE (A) 08/10/2022 0900   LEUKOCYTESUR Negative 08/20/2022 1350   LEUKOCYTESUR MODERATE (A) 08/10/2022 0900   Sepsis Labs: @LABRCNTIP (procalcitonin:4,lacticidven:4) Microbiology: Recent Results (from the past 240 hour(s))  Resp panel by RT-PCR (RSV, Flu A&B, Covid) Anterior Nasal Swab     Status: None   Collection Time: 12/09/22  8:15 PM   Specimen:  Anterior Nasal Swab  Result Value Ref Range Status   SARS Coronavirus 2 by RT PCR NEGATIVE NEGATIVE Final    Comment: (NOTE) SARS-CoV-2 target nucleic acids are NOT DETECTED.  The SARS-CoV-2 RNA is generally detectable in upper respiratory specimens during the acute phase of infection. The lowest concentration of SARS-CoV-2 viral copies this assay can detect is 138 copies/mL. A negative result does not preclude SARS-Cov-2 infection and should not be used as the sole basis for treatment or other patient management decisions. A negative result may occur with  improper specimen collection/handling, submission of specimen other than nasopharyngeal swab, presence of viral mutation(s) within the areas targeted by this assay, and inadequate number of viral copies(<138 copies/mL). A negative result must be combined with clinical observations, patient history, and epidemiological information. The expected result is Negative.  Fact Sheet for Patients:  BloggerCourse.com  Fact Sheet for Healthcare Providers:  SeriousBroker.it  This test is no t yet approved or cleared by the [place] FDA and  has been authorized for detection and/or diagnosis of SARS-CoV-2 by FDA under an Emergency Use Authorization (EUA). This EUA will remain  in effect (meaning this test can be used) for the duration of the COVID-19 declaration under Section 564(b)(1) of the Act, 21 U.S.C.section 360bbb-3(b)(1), unless the authorization is terminated  or revoked sooner.       Influenza A by PCR NEGATIVE NEGATIVE Final   Influenza B by PCR NEGATIVE NEGATIVE Final    Comment: (NOTE) The Xpert Xpress SARS-CoV-2/FLU/RSV plus assay is intended as an aid in the diagnosis of influenza from Nasopharyngeal swab specimens and should not be used as a sole basis for treatment. Nasal washings and aspirates are unacceptable for Xpert Xpress SARS-CoV-2/FLU/RSV testing.  Fact  Sheet for Patients: BloggerCourse.com  Fact Sheet for Healthcare Providers: SeriousBroker.it  This test is not yet approved or cleared by the [place] FDA and has been authorized for detection and/or diagnosis of SARS-CoV-2 by FDA under an Emergency Use Authorization (EUA). This EUA will remain in effect (meaning this test can be used) for the duration of  the COVID-19 declaration under Section 564(b)(1) of the Act, 21 U.S.C. section 360bbb-3(b)(1), unless the authorization is terminated or revoked.     Resp Syncytial Virus by PCR NEGATIVE NEGATIVE Final    Comment: (NOTE) Fact Sheet for Patients: BloggerCourse.com  Fact Sheet for Healthcare Providers: SeriousBroker.it  This test is not yet approved or cleared by the [place] FDA and has been authorized for detection and/or diagnosis of SARS-CoV-2 by FDA under an Emergency Use Authorization (EUA). This EUA will remain in effect (meaning this test can be used) for the duration of the COVID-19 declaration under Section 564(b)(1) of the Act, 21 U.S.C. section 360bbb-3(b)(1), unless the authorization is terminated or revoked.  Performed at [facility], [address] [place], [place] [address]       Radiology Studies last 3 days: CARDIAC CATHETERIZATION  Result Date: 12/11/2022   Dist LAD lesion is 50% stenosed.   Dist LM to Ost LAD lesion is 60% stenosed.   Dist RCA lesion is 65% stenosed.   1st Mrg lesion is 80% stenosed.   Mid Cx lesion is 70% stenosed.   1st Diag lesion is 75% stenosed.   Mid LAD lesion is 20% stenosed.   Non-stenotic Prox RCA lesion was previously treated.   The left ventricular systolic function is normal.   The left ventricular ejection fraction is 35-45% by visual estimate. 1.  NSTEMI 2.  Patent stent mid LAD, patent stent proximal RCA 3.  80% stenosis small caliber OM1, 70% stenosis  small caliber mid left circumflex, 75% stenosis small caliber ostium D1, 60-70% stenosis distal RCA 4.  Mildly reduced left ventricular function 5.  No definitive culprit lesion Recommendations 1.  Medical therapy 2.  Continue dual antiplatelet therapy 3.  Continue aggressive risk factor modification 4.  Probable discharge home in a.m.   ECHOCARDIOGRAM COMPLETE  Result Date: 12/11/2022    ECHOCARDIOGRAM REPORT   Patient Name:   YOSNIEL SPRAGG Date of Exam: 12/10/2022 Medical Rec #:  [id number]     Height:       70.0 in Accession #:    [id number]    Weight:       184.0 lb Date of Birth:  April 24, 1928     BSA:          2.015 m Patient Age:    89 years      BP:           128/49 mmHg Patient Gender: M             HR:           56 bpm. Exam Location:  [facility] Procedure: 2D Echo, Cardiac Doppler, Color Doppler and Strain Analysis Indications:     NSTEMI I21.4  History:         Patient has prior history of Echocardiogram examinations, most                  recent 03/26/2022. CHF; Risk Factors:Dyslipidemia and                  Hypertension.  Sonographer:     Cristela Blue Referring Phys:  [id number] CARALYN HUDSON Diagnosing Phys: Marcina Millard MD  Sonographer Comments: Global longitudinal strain was attempted. IMPRESSIONS  1. Left ventricular ejection fraction, by estimation, is 45 to 50%. The left ventricle has mildly decreased function. The left ventricle demonstrates regional wall motion abnormalities (see scoring diagram/findings for description). Left ventricular diastolic parameters were normal.  2. Right ventricular systolic function  is normal. The right ventricular size is normal.  3. The mitral valve is normal in structure. Mild mitral valve regurgitation. No evidence of mitral stenosis.  4. The aortic valve is normal in structure. Aortic valve regurgitation is trivial. No aortic stenosis is present.  5. The inferior vena cava is normal in size with greater than 50% respiratory variability, suggesting right atrial  pressure of 3 mmHg. FINDINGS  Left Ventricle: Left ventricular ejection fraction, by estimation, is 45 to 50%. The left ventricle has mildly decreased function. The left ventricle demonstrates regional wall motion abnormalities. The left ventricular internal cavity size was normal in size. There is no left ventricular hypertrophy. Left ventricular diastolic parameters were normal.  LV Wall Scoring: The apical lateral segment, apical septal segment, and apex are hypokinetic. Right Ventricle: The right ventricular size is normal. No increase in right ventricular wall thickness. Right ventricular systolic function is normal. Left Atrium: Left atrial size was normal in size. Right Atrium: Right atrial size was normal in size. Pericardium: There is no evidence of pericardial effusion. Mitral Valve: The mitral valve is normal in structure. Mild mitral valve regurgitation. No evidence of mitral valve stenosis. MV peak gradient, 4.6 mmHg. The mean mitral valve gradient is 2.0 mmHg. Tricuspid Valve: The tricuspid valve is normal in structure. Tricuspid valve regurgitation is mild . No evidence of tricuspid stenosis. Aortic Valve: The aortic valve is normal in structure. Aortic valve regurgitation is trivial. No aortic stenosis is present. Aortic valve mean gradient measures 3.0 mmHg. Aortic valve peak gradient measures 5.5 mmHg. Aortic valve area, by VTI measures 2.99 cm. Pulmonic Valve: The pulmonic valve was normal in structure. Pulmonic valve regurgitation is not visualized. No evidence of pulmonic stenosis. Aorta: The aortic root is normal in size and structure. Venous: The inferior vena cava is normal in size with greater than 50% respiratory variability, suggesting right atrial pressure of 3 mmHg. IAS/Shunts: No atrial level shunt detected by color flow Doppler.  LEFT VENTRICLE PLAX 2D LVIDd:         5.50 cm      Diastology LVIDs:         3.80 cm      LV e' medial:    4.03 cm/s LV PW:         1.00 cm      LV E/e'  medial:  23.7 LV IVS:        1.00 cm      LV e' lateral:   12.90 cm/s LVOT diam:     2.20 cm      LV E/e' lateral: 7.4 LV SV:         71 LV SV Index:   35 LVOT Area:     3.80 cm  LV Volumes (MOD) LV vol d, MOD A2C: 162.0 ml LV vol d, MOD A4C: 175.0 ml LV vol s, MOD A2C: 100.0 ml LV vol s, MOD A4C: 102.0 ml LV SV MOD A2C:     62.0 ml LV SV MOD A4C:     175.0 ml LV SV MOD BP:      69.5 ml RIGHT VENTRICLE RV Basal diam:  3.40 cm RV Mid diam:    2.10 cm RV S prime:     9.57 cm/s TAPSE (M-mode): 2.5 cm LEFT ATRIUM           Index        RIGHT ATRIUM           Index LA diam:  3.90 cm 1.94 cm/m   RA Area:     26.90 cm LA Vol (A2C): 55.3 ml 27.45 ml/m  RA Volume:   98.70 ml  48.99 ml/m LA Vol (A4C): 82.8 ml 41.10 ml/m  AORTIC VALVE AV Area (Vmax):    2.67 cm AV Area (Vmean):   2.75 cm AV Area (VTI):     2.99 cm AV Vmax:           117.00 cm/s AV Vmean:          73.900 cm/s AV VTI:            0.238 m AV Peak Grad:      5.5 mmHg AV Mean Grad:      3.0 mmHg LVOT Vmax:         82.20 cm/s LVOT Vmean:        53.400 cm/s LVOT VTI:          0.187 m LVOT/AV VTI ratio: 0.79  AORTA Ao Root diam: 3.10 cm MITRAL VALVE               TRICUSPID VALVE MV Area (PHT): 3.45 cm    TR Peak grad:   32.7 mmHg MV Area VTI:   2.09 cm    TR Vmax:        286.00 cm/s MV Peak grad:  4.6 mmHg MV Mean grad:  2.0 mmHg    SHUNTS MV Vmax:       1.07 m/s    Systemic VTI:  0.19 m MV Vmean:      60.9 cm/s   Systemic Diam: 2.20 cm MV Decel Time: 220 msec MV E velocity: 95.40 cm/s MV A velocity: 77.60 cm/s MV E/A ratio:  1.23 Marcina Millard MD Electronically signed by Marcina Millard MD Signature Date/Time: 12/11/2022/12:18:03 PM    Final    DG Chest 2 View  Result Date: 12/09/2022 CLINICAL DATA:  chest pain Pt was eating soup at 5p when he developed CP. Took two nitroglycerin without relief. Current pain is 8/10. Hx of two MIs EXAM: CHEST - 2 VIEW COMPARISON:  Chest x-ray 08/10/2022, CT chest 03/25/2022 FINDINGS: Slightly widened  appearing mediastinum due to patient rotation. The heart and mediastinal contours are unchanged. Atherosclerotic plaque. Coronary artery stent noted. No focal consolidation. No pulmonary edema. Possible trace right pleural effusion. No pneumothorax. No acute osseous abnormality. IMPRESSION: Possible trace right pleural effusion. Electronically Signed   By: Tish Frederickson M.D.   On: 12/09/2022 21:23           Sunnie Nielsen, DO [facility] 12/12/2022, 3:50 PM    Dictation software may have been used to generate the above note. Typos may occur and escape review in typed/dictated notes. Please contact Dr Lyn Hollingshead directly for clarity if needed.  Staff may message me via secure chat in Epic  but this may not receive an immediate response,  please page me for urgent matters!  If 7PM-7AM, please contact night coverage [email or web address]

## 2022-12-13 DIAGNOSIS — I214 Non-ST elevation (NSTEMI) myocardial infarction: Secondary | ICD-10-CM | POA: Diagnosis not present

## 2022-12-13 LAB — CBC
HCT: 26.6 % — ABNORMAL LOW (ref 39.0–52.0)
Hemoglobin: 9.5 g/dL — ABNORMAL LOW (ref 13.0–17.0)
MCH: 30.1 pg (ref 26.0–34.0)
MCHC: 35.7 g/dL (ref 30.0–36.0)
MCV: 84.2 fL (ref 80.0–100.0)
Platelets: 132 10*3/uL — ABNORMAL LOW (ref 150–400)
RBC: 3.16 MIL/uL — ABNORMAL LOW (ref 4.22–5.81)
RDW: 14 % (ref 11.5–15.5)
WBC: 5.9 10*3/uL (ref 4.0–10.5)
nRBC: 0 % (ref 0.0–0.2)

## 2022-12-13 LAB — URINALYSIS, ROUTINE W REFLEX MICROSCOPIC
Bilirubin Urine: NEGATIVE
Glucose, UA: NEGATIVE mg/dL
Hgb urine dipstick: NEGATIVE
Ketones, ur: NEGATIVE mg/dL
Nitrite: POSITIVE — AB
Protein, ur: 30 mg/dL — AB
Specific Gravity, Urine: 1.018 (ref 1.005–1.030)
WBC, UA: >50 WBC/hpf (ref 0–5)
pH: 5 (ref 5.0–8.0)

## 2022-12-13 LAB — BASIC METABOLIC PANEL
Anion gap: 9 (ref 5–15)
BUN: 32 mg/dL — ABNORMAL HIGH (ref 8–23)
CO2: 24 mmol/L (ref 22–32)
Calcium: 8.6 mg/dL — ABNORMAL LOW (ref 8.9–10.3)
Chloride: 104 mmol/L (ref 98–111)
Creatinine, Ser: 1.81 mg/dL — ABNORMAL HIGH (ref 0.61–1.24)
GFR, Estimated: 35 mL/min — ABNORMAL LOW (ref >60)
Glucose, Bld: 104 mg/dL — ABNORMAL HIGH (ref 70–99)
Potassium: 4.2 mmol/L (ref 3.5–5.1)
Sodium: 137 mmol/L (ref 135–145)

## 2022-12-13 MED ORDER — HYDRALAZINE HCL 50 MG PO TABS: 50.0000 mg | ORAL_TABLET | Freq: Three times a day (TID) | ORAL | 0 refills | Status: DC | PRN

## 2022-12-13 MED ORDER — METOPROLOL SUCCINATE ER 25 MG PO TB24: 25.0000 mg | ORAL_TABLET | Freq: Two times a day (BID) | ORAL | 0 refills | Status: DC

## 2022-12-13 MED ORDER — CIPROFLOXACIN HCL 500 MG PO TABS: 500.0000 mg | ORAL_TABLET | Freq: Two times a day (BID) | ORAL | 0 refills | Status: AC

## 2022-12-13 NOTE — Care Management Important Message (Signed)
Important Message  Patient Details  Name: Shawn Andrews MRN: [id number] Date of Birth: November 30, 1928   Important Message Given:  N/A - LOS <3 / Initial given by admissions     Olegario Messier A [name] 12/13/2022, 10:53 AM

## 2022-12-13 NOTE — Discharge Instructions (Signed)
If blood pressure is higher than 160 top number or 90 bottom number, please take the Hydralazine 50 mg up to three times per day

## 2022-12-13 NOTE — TOC Transition Note (Signed)
Transition of Care [facility]) - CM/SW Discharge Note   Patient Details  Name: Shawn Andrews MRN: [id number] Date of Birth: 07-Jul-1928  [facility] CM/SW Contact:  Darolyn Rua, LCSW Phone Number: 12/13/2022, 11:13 AM   Clinical Narrative:     Per Cards PA, orders in for cardiac rehab follow up, they will reach out to patient to schedule.   No additional discharge needs.     Barriers to Discharge: Continued Medical Work up   Patient Goals and CMS Choice   Choice offered to / list presented to : Patient, Spouse  Discharge Placement                         Discharge Plan and Services Additional resources added to the After Visit Summary for       Post Acute Care Choice:  (Outpatient therapy)                               Social Determinants of Health (SDOH) Interventions SDOH Screenings   Food Insecurity: No Food Insecurity (12/10/2022)  Housing: Low Risk  (12/10/2022)  Transportation Needs: No Transportation Needs (12/10/2022)  Utilities: Not At Risk (12/10/2022)  Depression (PHQ2-9): Low Risk  (07/17/2022)  Financial Resource Strain: Low Risk  (12/06/2022)   Received from [facility]  Tobacco Use: High Risk (12/10/2022)     Readmission Risk Interventions     No data to display

## 2022-12-13 NOTE — Plan of Care (Signed)
  Problem: Activity: Goal: Ability to tolerate increased activity will improve Outcome: Progressing   Problem: Cardiac: Goal: Ability to achieve and maintain adequate cardiovascular perfusion will improve Outcome: Progressing   Problem: Health Behavior/Discharge Planning: Goal: Ability to safely manage health-related needs after discharge will improve Outcome: Progressing   Problem: Education: Goal: Knowledge of General Education information will improve Description: Including pain rating scale, medication(s)/side effects and non-pharmacologic comfort measures Outcome: Progressing

## 2022-12-13 NOTE — Progress Notes (Signed)
Patient is ready for discharge.  I have reviewed discharge instructions with patient and wife. Patient IV's have been removed without complication. Patient will be transported home by wife.

## 2022-12-13 NOTE — Progress Notes (Cosign Needed Addendum)
[facility] CLINIC CARDIOLOGY PROGRESS NOTE       Patient ID: Shawn Andrews MRN: [id number] DOB/AGE: 87-Jul-1930 87 y.o.  Admit date: 12/09/2022 Referring Physician Dr. Artis Delay Primary Physician Sampson Goon Stann Mainland, MD Primary Cardiologist Dr. Dorothyann Peng Reason for Consultation ACS/NSTEMI  HPI: Shawn Andrews is a 89 y.o. male  with a past medical history of coronary artery disease, NSTEMI s/p two overlapping DES stent to proximal/mid LAD (09/2020), stent to proximal RCA and stent to in-stent restenosis of mid LAD (03/2022), chronic HFrEF (EF 30-35% 03/3022), hypertension, hyperlipidemia, DVT, CKD stage 3a, BPH who presented to the ED on 12/09/2022 for chest pain. Cardiology was consulted for further evaluation.   Interval history: -Patient seen and examined this AM. Denies any issues overnight.  -No recurrence of chest pain. Walked around unit with PT and tolerated this well.  -BP and HR stable.  -Renal function continues to improve.   Review of systems complete and found to be negative unless listed above    Past Medical History:  Diagnosis Date   Abnormal drug screen 06/22/2014   inapprop neg xanax rpt 3 mo (06/2014)   BPH (benign prostatic hypertrophy) 01/21/1998   has had 3 biopsies in past ([facility]) decided to stop PSA/DRE   CHF (congestive heart failure) (HCC)    Coronary artery disease    Hyperlipidemia 01/21/2002   Hypertension 05/22/2003   Left lumbar radiculopathy    Osteoarthritis 01/22/1988   knees, lumbar spondylosis and listhesis    Past Surgical History:  Procedure Laterality Date   CATARACT EXTRACTION  2013   bilateral   CORONARY ANGIOGRAPHY N/A 03/29/2022   Procedure: CORONARY ANGIOGRAPHY;  Surgeon: Alwyn Pea, MD;  Location: [facility] INVASIVE CV LAB;  Service: Cardiovascular;  Laterality: N/A;   CORONARY STENT INTERVENTION N/A 03/27/2022   Procedure: CORONARY STENT INTERVENTION;  Surgeon: Alwyn Pea, MD;  Location: [facility] INVASIVE CV LAB;   Service: Cardiovascular;  Laterality: N/A;   CORONARY STENT INTERVENTION N/A 03/29/2022   Procedure: CORONARY STENT INTERVENTION;  Surgeon: Alwyn Pea, MD;  Location: [facility] INVASIVE CV LAB;  Service: Cardiovascular;  Laterality: N/A;   ESI Left 08/2014, 11/2014, 08/2015   L S1, L L5/S1 transforaminal ESI; L4/5 L5/S1 zygapophysial injections, L S1 transforaminal ([facility])   FINGER SURGERY     right middle, partial traumatic amputation   IR CATHETER TUBE CHANGE  03/02/2021   KNEE ARTHROSCOPY  1990   right   KNEE SURGERY  04/2006   right partial knee replacement in [place] - rec ppx abx for any invasive procedure   LEFT HEART CATH AND CORONARY ANGIOGRAPHY N/A 01/18/2021   Procedure: LEFT HEART CATH AND CORONARY ANGIOGRAPHY;  Surgeon: Lamar Blinks, MD;  Location: [facility] INVASIVE CV LAB;  Service: Cardiovascular;  Laterality: N/A;   LEFT HEART CATH AND CORONARY ANGIOGRAPHY N/A 03/27/2022   Procedure: LEFT HEART CATH AND CORONARY ANGIOGRAPHY;  Surgeon: Alwyn Pea, MD;  Location: [facility] INVASIVE CV LAB;  Service: Cardiovascular;  Laterality: N/A;   LEFT HEART CATH AND CORONARY ANGIOGRAPHY N/A 12/11/2022   Procedure: LEFT HEART CATH AND CORONARY ANGIOGRAPHY;  Surgeon: Marcina Millard, MD;  Location: [facility] INVASIVE CV LAB;  Service: Cardiovascular;  Laterality: N/A;    Medications Prior to Admission  Medication Sig Dispense Refill Last Dose   acetaminophen (TYLENOL 8 HOUR) 650 MG CR tablet Take 1 tablet (650 mg total) by mouth 2 (two) times a day.   prn   aspirin 81 MG chewable tablet Chew 1 tablet  by mouth daily.   12/09/2022   Cholecalciferol (VITAMIN D3) 25 MCG (1000 UT) CAPS Take 1 capsule (1,000 Units total) by mouth daily. 30 capsule  12/09/2022   clopidogrel (PLAVIX) 75 MG tablet Take 75 mg by mouth daily.   12/09/2022 at 1800   cyanocobalamin 1000 MCG tablet Take 1,000 mcg by mouth daily.   12/09/2022   docusate sodium (COLACE) 100 MG capsule Take 1 capsule (100 mg total) by  mouth daily.      famotidine (PEPCID) 20 MG tablet TAKE 1 TABLET BY MOUTH AT  BEDTIME 90 tablet 3 12/09/2022   finasteride (PROSCAR) 5 MG tablet TAKE 1 TABLET BY MOUTH  DAILY 90 tablet 3 12/09/2022   furosemide (LASIX) 20 MG tablet Take 1 tablet by mouth every other day.   12/09/2022   isosorbide mononitrate (IMDUR) 60 MG 24 hr tablet Take 1 tablet (60 mg total) by mouth 2 (two) times daily. 60 tablet 1 12/09/2022   loratadine (CLARITIN) 10 MG tablet Take 1 tablet (10 mg total) by mouth daily as needed for allergies. 30 tablet  12/09/2022   Melatonin 10 MG TABS Take 1 tablet by mouth at bedtime as needed.   prn   metoprolol tartrate (LOPRESSOR) 50 MG tablet Take 0.5 tablets (25 mg total) by mouth 2 (two) times daily.   12/09/2022   nitroGLYCERIN (NITROSTAT) 0.4 MG SL tablet Place 1 tablet (0.4 mg total) under the tongue every 5 (five) minutes x 3 doses as needed for chest pain. 30 tablet 12 prn   pantoprazole (PROTONIX) 40 MG tablet Take 40 mg by mouth 2 (two) times daily.   12/09/2022   polyethylene glycol powder (GLYCOLAX/MIRALAX) 17 GM/SCOOP powder Take 8.5-17 g by mouth daily as needed for moderate constipation. 3350 g 0 prn   primidone (MYSOLINE) 50 MG tablet Take 2 tablets (100 mg total) by mouth 3 (three) times daily.   12/09/2022   rosuvastatin (CRESTOR) 20 MG tablet Take 20 mg by mouth at bedtime.   12/09/2022   [DISCONTINUED] hydrALAZINE (APRESOLINE) 50 MG tablet Take 1 tablet (50 mg total) by mouth every 8 (eight) hours. 90 tablet 0 12/09/2022   Social History   Socioeconomic History   Marital status: Married    Spouse name: Not on file   Number of children: Not on file   Years of education: Not on file   Highest education level: Not on file  Occupational History   Occupation: retired Patent examiner, Network engineer, owns lawn care business  Tobacco Use   Smoking status: Former    Current packs/day: 0.00    Types: Pipe, Cigarettes    Quit date: 12/26/2000    Years since  quitting: 21.9    Passive exposure: Past   Smokeless tobacco: Current    Types: Chew   Tobacco comments:    quit over 30 years ago  Vaping Use   Vaping status: Never Used  Substance and Sexual Activity   Alcohol use: Yes    Alcohol/week: 0.0 standard drinks of alcohol    Comment: Occasional beer   Drug use: No   Sexual activity: Not Currently  Other Topics Concern   Not on file  Social History Narrative   Widowed, remarried 06/24/2007.   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 [employer]    Activity: walks 1.5 miles 3d/wk   Diet: good water, fruits/vegetables daily   Social Determinants of Corporate investment banker  Strain: Low Risk  (12/06/2022)   Received from [facility]   Overall Financial Resource Strain (CARDIA)    Difficulty of Paying Living Expenses: Not hard at all  Food Insecurity: No Food Insecurity (12/10/2022)   Hunger Vital Sign    Worried About Running Out of Food in the Last Year: Never true    Ran Out of Food in the Last Year: Never true  Transportation Needs: No Transportation Needs (12/10/2022)   PRAPARE - Administrator, Civil Service (Medical): No    Lack of Transportation (Non-Medical): No  Physical Activity: Not on file  Stress: Not on file  Social Connections: Not on file  Intimate Partner Violence: Not At Risk (12/10/2022)   Humiliation, Afraid, Rape, and Kick questionnaire    Fear of Current or Ex-Partner: No    Emotionally Abused: No    Physically Abused: No    Sexually Abused: No    Family History  Problem Relation Age of Onset   Hypertension Mother    Heart disease Brother 44       MI   Diabetes Brother    Cancer Daughter        melanoma   Stroke Neg Hx      Vitals:   12/12/22 1914 12/12/22 2314 12/13/22 0413 12/13/22 0837  BP: (!) 143/59 (!) 136/51 (!) 140/68 (!) 111/44  Pulse: 65 64 69 69  Resp: 18 18 18 16   Temp:  98.4 F (36.9 C) 98.3 F (36.8 C) 98.2 F (36.8 C) 98.8 F (37.1 C)  TempSrc:      SpO2: 93% 93% 97% 93%  Weight:      Height:        PHYSICAL EXAM General: Well-appearing elderly male, well nourished, in no acute distress, laying nearly flat in hospital bed. HEENT: Normocephalic and atraumatic. Neck: No JVD.   Lungs: Normal respiratory effort on room air. Clear bilaterally to auscultation. No wheezes, crackles, rhonchi.  Heart: HRRR. Normal S1 and S2 without gallops or murmurs.  Abdomen: Non-distended appearing.  Msk: Normal strength and tone for age. Extremities: Warm and well perfused. No clubbing, cyanosis. No edema. Radial access sites without significant tenderness, bruising. Neuro: Alert and oriented X 3. Psych: Answers questions appropriately.   Labs: Basic Metabolic Panel: Recent Labs    12/12/22 0246 12/13/22 0447  NA 137 137  K 4.8 4.2  CL 107 104  CO2 22 24  GLUCOSE 104* 104*  BUN 29* 32*  CREATININE 1.85* 1.81*  CALCIUM 8.5* 8.6*   Liver Function Tests: No results for input(s): "AST", "ALT", "ALKPHOS", "BILITOT", "PROT", "ALBUMIN" in the last 72 hours.  No results for input(s): "LIPASE", "AMYLASE" in the last 72 hours.  CBC: Recent Labs    12/12/22 0246 12/13/22 0447  WBC 7.3 5.9  HGB 9.8* 9.5*  HCT 28.4* 26.6*  MCV 85.8 84.2  PLT 133* 132*   Cardiac Enzymes: Recent Labs    12/10/22 1400  TROPONINIHS 2,514*   BNP: No results for input(s): "BNP" in the last 72 hours. D-Dimer: No results for input(s): "DDIMER" in the last 72 hours. Hemoglobin A1C: No results for input(s): "HGBA1C" in the last 72 hours. Fasting Lipid Panel: No results for input(s): "CHOL", "HDL", "LDLCALC", "TRIG", "CHOLHDL", "LDLDIRECT" in the last 72 hours. Thyroid Function Tests: No results for input(s): "TSH", "T4TOTAL", "T3FREE", "THYROIDAB" in the last 72 hours.  Invalid input(s): "FREET3" Anemia Panel: No results for input(s): "VITAMINB12", "FOLATE",  "FERRITIN", "TIBC", "IRON", "RETICCTPCT" in the  last 72 hours.   Radiology: CARDIAC CATHETERIZATION  Result Date: 12/11/2022   Dist LAD lesion is 50% stenosed.   Dist LM to Ost LAD lesion is 60% stenosed.   Dist RCA lesion is 65% stenosed.   1st Mrg lesion is 80% stenosed.   Mid Cx lesion is 70% stenosed.   1st Diag lesion is 75% stenosed.   Mid LAD lesion is 20% stenosed.   Non-stenotic Prox RCA lesion was previously treated.   The left ventricular systolic function is normal.   The left ventricular ejection fraction is 35-45% by visual estimate. 1.  NSTEMI 2.  Patent stent mid LAD, patent stent proximal RCA 3.  80% stenosis small caliber OM1, 70% stenosis small caliber mid left circumflex, 75% stenosis small caliber ostium D1, 60-70% stenosis distal RCA 4.  Mildly reduced left ventricular function 5.  No definitive culprit lesion Recommendations 1.  Medical therapy 2.  Continue dual antiplatelet therapy 3.  Continue aggressive risk factor modification 4.  Probable discharge home in a.m.   ECHOCARDIOGRAM COMPLETE  Result Date: 12/11/2022    ECHOCARDIOGRAM REPORT   Patient Name:   Shawn Andrews Date of Exam: 12/10/2022 Medical Rec #:  [id number]     Height:       70.0 in Accession #:    [id number]    Weight:       184.0 lb Date of Birth:  May 15, 1928     BSA:          2.015 m Patient Age:    89 years      BP:           128/49 mmHg Patient Gender: M             HR:           56 bpm. Exam Location:  [facility] Procedure: 2D Echo, Cardiac Doppler, Color Doppler and Strain Analysis Indications:     NSTEMI I21.4  History:         Patient has prior history of Echocardiogram examinations, most                  recent 03/26/2022. CHF; Risk Factors:Dyslipidemia and                  Hypertension.  Sonographer:     Cristela Blue Referring Phys:  [id number] Lakysha Kossman Diagnosing Phys: Marcina Millard MD  Sonographer Comments: Global longitudinal strain was attempted. IMPRESSIONS  1. Left ventricular ejection fraction, by  estimation, is 45 to 50%. The left ventricle has mildly decreased function. The left ventricle demonstrates regional wall motion abnormalities (see scoring diagram/findings for description). Left ventricular diastolic parameters were normal.  2. Right ventricular systolic function is normal. The right ventricular size is normal.  3. The mitral valve is normal in structure. Mild mitral valve regurgitation. No evidence of mitral stenosis.  4. The aortic valve is normal in structure. Aortic valve regurgitation is trivial. No aortic stenosis is present.  5. The inferior vena cava is normal in size with greater than 50% respiratory variability, suggesting right atrial pressure of 3 mmHg. FINDINGS  Left Ventricle: Left ventricular ejection fraction, by estimation, is 45 to 50%. The left ventricle has mildly decreased function. The left ventricle demonstrates regional wall motion abnormalities. The left ventricular internal cavity size was normal in size. There is no left ventricular hypertrophy. Left ventricular diastolic parameters were normal.  LV Wall Scoring: The apical lateral segment, apical septal segment, and apex are hypokinetic. Right  Ventricle: The right ventricular size is normal. No increase in right ventricular wall thickness. Right ventricular systolic function is normal. Left Atrium: Left atrial size was normal in size. Right Atrium: Right atrial size was normal in size. Pericardium: There is no evidence of pericardial effusion. Mitral Valve: The mitral valve is normal in structure. Mild mitral valve regurgitation. No evidence of mitral valve stenosis. MV peak gradient, 4.6 mmHg. The mean mitral valve gradient is 2.0 mmHg. Tricuspid Valve: The tricuspid valve is normal in structure. Tricuspid valve regurgitation is mild . No evidence of tricuspid stenosis. Aortic Valve: The aortic valve is normal in structure. Aortic valve regurgitation is trivial. No aortic stenosis is present. Aortic valve mean gradient  measures 3.0 mmHg. Aortic valve peak gradient measures 5.5 mmHg. Aortic valve area, by VTI measures 2.99 cm. Pulmonic Valve: The pulmonic valve was normal in structure. Pulmonic valve regurgitation is not visualized. No evidence of pulmonic stenosis. Aorta: The aortic root is normal in size and structure. Venous: The inferior vena cava is normal in size with greater than 50% respiratory variability, suggesting right atrial pressure of 3 mmHg. IAS/Shunts: No atrial level shunt detected by color flow Doppler.  LEFT VENTRICLE PLAX 2D LVIDd:         5.50 cm      Diastology LVIDs:         3.80 cm      LV e' medial:    4.03 cm/s LV PW:         1.00 cm      LV E/e' medial:  23.7 LV IVS:        1.00 cm      LV e' lateral:   12.90 cm/s LVOT diam:     2.20 cm      LV E/e' lateral: 7.4 LV SV:         71 LV SV Index:   35 LVOT Area:     3.80 cm  LV Volumes (MOD) LV vol d, MOD A2C: 162.0 ml LV vol d, MOD A4C: 175.0 ml LV vol s, MOD A2C: 100.0 ml LV vol s, MOD A4C: 102.0 ml LV SV MOD A2C:     62.0 ml LV SV MOD A4C:     175.0 ml LV SV MOD BP:      69.5 ml RIGHT VENTRICLE RV Basal diam:  3.40 cm RV Mid diam:    2.10 cm RV S prime:     9.57 cm/s TAPSE (M-mode): 2.5 cm LEFT ATRIUM           Index        RIGHT ATRIUM           Index LA diam:      3.90 cm 1.94 cm/m   RA Area:     26.90 cm LA Vol (A2C): 55.3 ml 27.45 ml/m  RA Volume:   98.70 ml  48.99 ml/m LA Vol (A4C): 82.8 ml 41.10 ml/m  AORTIC VALVE AV Area (Vmax):    2.67 cm AV Area (Vmean):   2.75 cm AV Area (VTI):     2.99 cm AV Vmax:           117.00 cm/s AV Vmean:          73.900 cm/s AV VTI:            0.238 m AV Peak Grad:      5.5 mmHg AV Mean Grad:      3.0 mmHg LVOT Vmax:  82.20 cm/s LVOT Vmean:        53.400 cm/s LVOT VTI:          0.187 m LVOT/AV VTI ratio: 0.79  AORTA Ao Root diam: 3.10 cm MITRAL VALVE               TRICUSPID VALVE MV Area (PHT): 3.45 cm    TR Peak grad:   32.7 mmHg MV Area VTI:   2.09 cm    TR Vmax:        286.00 cm/s MV Peak grad:   4.6 mmHg MV Mean grad:  2.0 mmHg    SHUNTS MV Vmax:       1.07 m/s    Systemic VTI:  0.19 m MV Vmean:      60.9 cm/s   Systemic Diam: 2.20 cm MV Decel Time: 220 msec MV E velocity: 95.40 cm/s MV A velocity: 77.60 cm/s MV E/A ratio:  1.23 Marcina Millard MD Electronically signed by Marcina Millard MD Signature Date/Time: 12/11/2022/12:18:03 PM    Final    DG Chest 2 View  Result Date: 12/09/2022 CLINICAL DATA:  chest pain Pt was eating soup at 5p when he developed CP. Took two nitroglycerin without relief. Current pain is 8/10. Hx of two MIs EXAM: CHEST - 2 VIEW COMPARISON:  Chest x-ray 08/10/2022, CT chest 03/25/2022 FINDINGS: Slightly widened appearing mediastinum due to patient rotation. The heart and mediastinal contours are unchanged. Atherosclerotic plaque. Coronary artery stent noted. No focal consolidation. No pulmonary edema. Possible trace right pleural effusion. No pneumothorax. No acute osseous abnormality. IMPRESSION: Possible trace right pleural effusion. Electronically Signed   By: Tish Frederickson M.D.   On: 12/09/2022 21:23    NM Myocardial Perfusion SPECT multiple: 01/08/2022 Abnormal myocardial perfusion scan left ventricular enlargement globally depressed ejection fraction of 35% there is a moderate to large area of anterior apical persistent defect consistent with scar there is also RV uptake consistent with systolic cardiomyopathy.  No clear evidence of reversible ischemia. Conclusion this is a high risk study   Most recent cardiac caths:  03/27/2022   Mid LAD lesion is 95% stenosed.   Prox RCA lesion is 75% stenosed.   Dist LM to Ost LAD lesion is 60% stenosed.   Dist LAD lesion is 50% stenosed.   A drug-eluting stent was successfully placed using a STENT ONYX FRONTIER 3.5X15.   Post intervention, there is a 0% residual stenosis.   There is moderate left ventricular systolic dysfunction.   LV end diastolic pressure is mildly elevated.   The left ventricular ejection  fraction is 35-45% by visual estimate.  03/29/2022   Mid LAD lesion is 95% stenosed.   Dist LAD lesion is 50% stenosed.   Dist LM to Ost LAD lesion is 60% stenosed.   Non-stenotic Prox RCA lesion was previously treated.   A stent was successfully placed.   Post intervention, there is a 0% residual stenosis.  ECHO 03/26/2022  1. Ant/Apical hypo.   2. Left ventricular ejection fraction, by estimation, is 30 to 35%. The left ventricle has moderately decreased function. The left ventricle demonstrates regional wall motion abnormalities (see scoring diagram/findings for description). The left ventricular internal cavity size was moderately dilated. There is mild concentric left ventricular hypertrophy. Left ventricular diastolic parameters are consistent with Grade I diastolic dysfunction (impaired  relaxation).   3. Right ventricular systolic function is low normal. The right ventricular size is mildly enlarged. Mildly increased right ventricular wall thickness. There is severely elevated pulmonary  artery systolic pressure.   4. Left atrial size was moderately dilated.   5. Right atrial size was mildly dilated.   6. The mitral valve is normal in structure. Mild mitral valve regurgitation.   7. Tricuspid valve regurgitation is mild to moderate.   8. The aortic valve is grossly normal. Aortic valve regurgitation is not visualized.   TELEMETRY reviewed by me 12/13/2022: Sinus rhythm, rate 60s  EKG reviewed by me: NSR with non specific ST-T changes, rate 92 bpm.   Data reviewed by me 12/13/2022: last 24h vitals tele labs imaging I/O hospitalist progress note  Principal Problem:   NSTEMI (non-ST elevated myocardial infarction) [facility]) Active Problems:   Essential hypertension   Essential tremor   Chronic kidney disease, stage 3b (HCC)   Leg DVT (deep venous thromboembolism), acute, left (HCC)   CAD (coronary artery disease)   HFrEF (heart failure with reduced ejection fraction) (HCC)    Suprapubic catheter (HCC)   Dyslipidemia   BPH (benign prostatic hyperplasia)    ASSESSMENT AND PLAN:  Shawn Andrews is a 89 y.o. male  with a past medical history of coronary artery disease, NSTEMI s/p two overlapping DES stent to proximal/mid LAD (09/2020), stent to proximal RCA and stent to in-stent restenosis of mid LAD (03/2022), chronic HFrEF (EF 30-35% 03/3022), hypertension, hyperlipidemia, DVT, CKD stage 3a, BPH who presented to the ED on 12/09/2022 for chest pain. Cardiology was consulted for further evaluation.   # NSTEMI, type 1 # Coronary artery disease s/p 3 stents (mid/proximal LAD, proximal RCA) # Chronic HFrEF (EF 30-35% - 03/2022) Patient presented to ED with "sharp" diffuse chest pain across chest that occurred at rest with no associated shortness of breath. Troponin elevated and trending 23 > 172 > 2,561 > 2514. EKG in ED showed NSR with non specific ST-T changes, rate 92 bpm. S/p ASA 324 mg. Patient started on heparin gtt in the ED. No recurrence of CP. -Continue aspirin 81 mg daily and plavix 75 mg daily. -Continue home Imdur 60 mg BID, metoprolol succinate 25 mg BID, hydralazine 50 mg q8hrs. Can consider uptitration of imdur vs metoprolol at follow up.  -Recommend patient follows up with GI provider. -Cardiac rehab referral on discharge.  # Hypertension # Hyperlipidemia -Continue home rosuvastatin 20 mg daily.  -Home BP medications as above.   # CKD stage 3a # Anemia Upon arrival to ED, Cr 1.79 (at baseline) with stable Na and K. Cr up to 2.06 improved to 1.81 today. Hgb dropped from 10.7 to 8.4 on admission now stable at 9.5. -Continue to monitor renal function.   Ok for discharge today from a cardiac perspective. Will arrange for follow up in clinic with Dr. Juliann Pares in 1-2 weeks.    This patient's plan of care was discussed and created with Dr. Darrold Junker and he is in agreement.  Signed: Gale Journey, PA-C  12/13/2022, 11:28 AM [facility] Cardiology

## 2022-12-13 NOTE — Progress Notes (Signed)
Discharge orders were in place, plan was reviewed w/ pt/family/ As RN was preparing discharge, wife noted concern for dark urine, pt has hx leaving hospital and having to come right back w/ UTI. UA obtained, unable to assess dysuria/frequency given suprapubic cath, UA significantly abnormal but no other symptoms, last cultures reviewed, starting po cipro, need to exchange suprapubic cath before he can discharge - d/discharge order held, held but otherwise everything is in place for patient to go home today and he has follow up in 4 days w/ urology outpatient

## 2022-12-13 NOTE — Progress Notes (Signed)
Physical Therapy Treatment Patient Details Name: Shawn Andrews MRN: [id number] DOB: 1928-04-18 Today's Date: 12/13/2022   History of Present Illness Pt is a 89 y.o. male  with a past medical history of coronary artery disease, NSTEMI s/p two overlapping DES stent to proximal/mid LAD (09/2020), stent to proximal RCA and stent to in-stent restenosis of mid LAD (03/2022), chronic HFrEF (EF 30-35% 03/3022), hypertension, hyperlipidemia, DVT, CKD stage 3a, BPH who presented to the ED on 12/09/2022 for chest pain.    PT Comments  Pt A&Ox4, reported no pain. Pt still required cueing to minimize weight bearing of LUE, but overall improved tolerance to activity, balance, and endurance. Pt on room air throughout, 90% or greater and noticeably less WOB as well. He was able to ambulate with and without RW, but without needed CGA and exhibited gait path deviations. Agreeable to use RW. Returned to room and up in recliner with breakfast set up. The patient would benefit from further skilled PT intervention to continue to progress towards goals, may also benefit from bout of cardiac rehab, discussed with care team.     If plan is discharge home, recommend the following: A little help with bathing/dressing/bathroom;Assistance with cooking/housework;Assist for transportation;Help with stairs or ramp for entrance;Direct supervision/assist for medications management   Can travel by private vehicle        Equipment Recommendations  Rolling walker (2 wheels)    Recommendations for Other Services       Precautions / Restrictions Precautions Precautions: Fall Restrictions Weight Bearing Restrictions: No     Mobility  Bed Mobility Overal bed mobility: Modified Independent             General bed mobility comments: cued to limit LUE weight bearing    Transfers Overall transfer level: Needs assistance Equipment used: Rolling walker (2 wheels), None Transfers: Sit to/from Stand Sit to Stand:  Modified independent (Device/Increase time)                Ambulation/Gait Ambulation/Gait assistance: Contact guard assist, Modified independent (Device/Increase time) Gait Distance (Feet): 350 Feet Assistive device: Rolling walker (2 wheels), None         General Gait Details: modI with RW, CGA without. did exhibit gait path deviations without RW no overt LOB but agreeable to use RW for now   Stairs             Wheelchair Mobility     Tilt Bed    Modified Rankin (Stroke Patients Only)       Balance Overall balance assessment: Needs assistance Sitting-balance support: Feet supported Sitting balance-Leahy Scale: Good       Standing balance-Leahy Scale: Good                              Cognition Arousal: Alert Behavior During Therapy: WFL for tasks assessed/performed Overall Cognitive Status: Within Functional Limits for tasks assessed                                          Exercises Other Exercises Other Exercises: able to don shoes minA and then tie his own shoes    General Comments        Pertinent Vitals/Pain Pain Assessment Pain Assessment: No/denies pain    Home Living  Prior Function            PT Goals (current goals can now be found in the care plan section) Progress towards PT goals: Progressing toward goals    Frequency    Min 1X/week      PT Plan      Co-evaluation              AM-PAC PT "6 Clicks" Mobility   Outcome Measure  Help needed turning from your back to your side while in a flat bed without using bedrails?: A Little Help needed moving from lying on your back to sitting on the side of a flat bed without using bedrails?: A Little Help needed moving to and from a bed to a chair (including a wheelchair)?: A Little Help needed standing up from a chair using your arms (e.g., wheelchair or bedside chair)?: A Little Help needed to walk in  hospital room?: A Little Help needed climbing 3-5 steps with a railing? : A Little 6 Click Score: 18    End of Session Equipment Utilized During Treatment: Gait belt Activity Tolerance: Patient tolerated treatment well Patient left: with call bell/phone within reach;with chair alarm set;in chair Nurse Communication: Mobility status PT Visit Diagnosis: Other abnormalities of gait and mobility (R26.89);Muscle weakness (generalized) (M62.81);Difficulty in walking, not elsewhere classified (R26.2)     Time: 1610-9604 PT Time Calculation (min) (ACUTE ONLY): 20 min  Charges:    $Therapeutic Activity: 8-22 mins PT General Charges $$ ACUTE PT VISIT: 1 Visit                     Olga Coaster PT, DPT 9:34 AM,12/13/22

## 2022-12-15 LAB — URINE CULTURE: Culture: >=100000 — AB

## 2022-12-15 NOTE — Discharge Summary (Signed)
Physician Discharge Summary   Patient: Shawn Andrews MRN: [id number]  DOB: [birth year]   Admit:     Date of Admission: 12/09/2022 Admitted from: home   Discharge: Date of discharge: 12/13/2022 Disposition: Home w home health Condition at discharge: good  CODE STATUS: FULL CODE     Discharge Physician: Sunnie Nielsen, DO [facility]     PCP: Mick Sell, MD  Recommendations for Outpatient Follow-up:  Follow up with PCP Mick Sell, MD in 1-2 weeks Follow as directed w/ urology asap    Discharge Instructions     AMB referral to Phase II Cardiac Rehabilitation   Complete by: As directed    Diagnosis: NSTEMI   After initial evaluation and assessments completed: Virtual Based Care may be provided alone or in conjunction with Phase 2 Cardiac Rehab based on patient barriers.: Yes   Intensive Cardiac Rehabilitation (ICR) [facility] location only OR Traditional Cardiac Rehabilitation (TCR) *If criteria for ICR are not met will enroll in TCR [facility] only): Yes   Diet - low sodium heart healthy   Complete by: As directed    Increase activity slowly   Complete by: As directed          Discharge Diagnoses: Principal Problem:   NSTEMI (non-ST elevated myocardial infarction) (HCC) Active Problems:   HFrEF (heart failure with reduced ejection fraction) (HCC)   Chronic kidney disease, stage 3b (HCC)   Suprapubic catheter (HCC)   Essential hypertension   Essential tremor   Leg DVT (deep venous thromboembolism), acute, left (HCC)   CAD (coronary artery disease)   Dyslipidemia   BPH (benign prostatic hyperplasia)       HPI: Shawn Andrews is a 89 y.o. Caucasian male with medical history significant for NSTEMI s/p two overlapping DES stent to proximal/mid LAD (09/2020), stent to proximal RCA and stent to in-stent restenosis of mid LAD (03/2022), chronic HFrEF (EF 30-35% 03/3022), hypertension, hyperlipidemia, DVT, CKD stage 3a, BPH, essential tremors, and  osteoarthritis, who presented to the emergency room with acute onset of chest pain   Hospital course / significant events:  11/18: Troponin 23 --> 172. Initial EKG showed normal sinus rhythm with rate of 92 with left axis deviation and LVH with QRS widening and with poor R wave progression. Repeat EKG showed sinus rhythm with a rate of 70 with PACs and borderline prolonged PR interval with nonspecific intraventricular conduction delay and Q waves anteroseptally with T wave inversion laterally. Admitted to hospitalist service for NSTEMI, HFrEF 11/19: remain on heparin gtt pending cath 11/20: LHC no definitive culprit lesion, medical management   11/21: weakness but improving, one more day PT/OT given O2 requirement  11/22: doing well, ready for d/c then wife concerned for dark urine, suprapubic cath, UA significantly abnormal, started on cipro and cath exchanged, pt to follow asap / urology   Consultants:  Cardiology   Procedures/Surgeries: 12/11/22: L heart catheterization - Dr Darrold Junker       ASSESSMENT & PLAN:   NSTEMI Type 1 Coronary artery disease s/p 3 stents (mid/proximal LAD, proximal RCA)  HTN HLD cardiology following - ok for discharge from their perspective  Continue aspirin 81 mg daily and plavix 75 mg daily. Continue home Imdur 60 mg BID, metoprolol succinate 25 mg BID, hydralazine 50 mg q8hrs. Recs follow w/ GI outpatient    Chronic HFrEF (EF 30-35% - 03/2022)  Appears euvolemic cont home lasix, imdur  Acute hypoxic respiratory failure  Supplemental O2 to wean as able,  expect will be off by tomorrow and ok for discharge   CKD 3b Gfr is at baseline Monitor BMP  BPH Cont home finasteride   Suprapubic catheter 2/2 bph, chronic catheter care.    History provoked DVT Not currently anticoagulated   Tremor home primidone   Chronic suprapubic catheter UTI Cipro  Urology f/u  Cath changed today prior to discharge              Discharge  Instructions  Allergies as of 12/13/2022       Reactions   Penicillins Swelling   Of tongue Has patient had a PCN reaction causing immediate rash, facial/tongue/throat swelling, SOB or lightheadedness with hypotension: Yes Has patient had a PCN reaction causing severe rash involving mucus membranes or skin necrosis: No Has patient had a PCN reaction that required hospitalization: No Has patient had a PCN reaction occurring within the last 10 years: No If all of the above answers are "NO", then may proceed with Cephalosporin use.   Cialis [tadalafil] Other (See Comments)   Indigestion   Lipitor [atorvastatin] Other (See Comments)   Severe Myalgias   Lisinopril Cough        Medication List     STOP taking these medications    metoprolol tartrate 50 MG tablet Commonly known as: LOPRESSOR   polyethylene glycol powder 17 GM/SCOOP powder Commonly known as: GLYCOLAX/MIRALAX       TAKE these medications    acetaminophen 650 MG CR tablet Commonly known as: Tylenol 8 Hour Take 1 tablet (650 mg total) by mouth 2 (two) times a day.   aspirin 81 MG chewable tablet Chew 1 tablet by mouth daily.   ciprofloxacin 500 MG tablet Commonly known as: CIPRO Take 1 tablet (500 mg total) by mouth 2 (two) times daily for 7 days.   clopidogrel 75 MG tablet Commonly known as: PLAVIX Take 75 mg by mouth daily.   cyanocobalamin 1000 MCG tablet Take 1,000 mcg by mouth daily.   docusate sodium 100 MG capsule Commonly known as: Colace Take 1 capsule (100 mg total) by mouth daily.   famotidine 20 MG tablet Commonly known as: PEPCID TAKE 1 TABLET BY MOUTH AT  BEDTIME   finasteride 5 MG tablet Commonly known as: PROSCAR TAKE 1 TABLET BY MOUTH  DAILY   furosemide 20 MG tablet Commonly known as: LASIX Take 1 tablet by mouth every other day.   hydrALAZINE 50 MG tablet Commonly known as: APRESOLINE Take 1 tablet (50 mg total) by mouth every 8 (eight) hours as needed (systolic (top  number) blood pressure higher than 160 OR diastolic (bottom number) blood pressure higher than 100). What changed:  when to take this reasons to take this   isosorbide mononitrate 60 MG 24 hr tablet Commonly known as: IMDUR Take 1 tablet (60 mg total) by mouth 2 (two) times daily.   loratadine 10 MG tablet Commonly known as: CLARITIN Take 1 tablet (10 mg total) by mouth daily as needed for allergies.   Melatonin 10 MG Tabs Take 1 tablet by mouth at bedtime as needed.   metoprolol succinate 25 MG 24 hr tablet Commonly known as: TOPROL-XL Take 1 tablet (25 mg total) by mouth 2 (two) times daily.   nitroGLYCERIN 0.4 MG SL tablet Commonly known as: NITROSTAT Place 1 tablet (0.4 mg total) under the tongue every 5 (five) minutes x 3 doses as needed for chest pain.   pantoprazole 40 MG tablet Commonly known as: PROTONIX Take 40 mg by mouth  2 (two) times daily.   primidone 50 MG tablet Commonly known as: MYSOLINE Take 2 tablets (100 mg total) by mouth 3 (three) times daily.   rosuvastatin 20 MG tablet Commonly known as: CRESTOR Take 20 mg by mouth at bedtime.   Vitamin D3 25 MCG (1000 UT) Caps Take 1 capsule (1,000 Units total) by mouth daily.         Follow-up Information     Alwyn Pea, MD. Go in 1 week(s).   Specialties: Cardiology, Internal Medicine Why: Appointment on Tuesday, 12/31/2022 at 8:15am. Contact information: [address] [place] [address] [phone]         Mick Sell, MD. Schedule an appointment as soon as possible for a visit.   Specialty: Infectious Diseases Why: hosptial follow up Contact information: [address] [place] [address] [phone]                 Allergies  Allergen Reactions   Penicillins Swelling    Of tongue Has patient had a PCN reaction causing immediate rash, facial/tongue/throat swelling, SOB or lightheadedness with hypotension: Yes Has patient had a PCN reaction  causing severe rash involving mucus membranes or skin necrosis: No Has patient had a PCN reaction that required hospitalization: No Has patient had a PCN reaction occurring within the last 10 years: No If all of the above answers are "NO", then may proceed with Cephalosporin use.   Cialis [Tadalafil] Other (See Comments)    Indigestion   Lipitor [Atorvastatin] Other (See Comments)    Severe Myalgias   Lisinopril Cough     Subjective: pt reports feling well. Ambulating much better today, no SOB   Discharge Exam: BP 137/70 (BP Location: Right Arm)   Pulse 61   Temp 98.1 F (36.7 C) (Oral)   Resp 16   Ht 5\' 10"  (1.778 m)   Wt 80 kg   SpO2 97%   BMI 25.31 kg/m  General: Pt is alert, awake, not in acute distress Cardiovascular: RRR, S1/S2 +, no rubs, no gallops Respiratory: CTA bilaterally, no wheezing, no rhonchi Abdominal: Soft, NT, ND, bowel sounds + Extremities: no edema, no cyanosis     The results of significant diagnostics from this hospitalization (including imaging, microbiology, ancillary and laboratory) are listed below for reference.     Microbiology: Recent Results (from the past 240 hour(s))  Resp panel by RT-PCR (RSV, Flu A&B, Covid) Anterior Nasal Swab     Status: None   Collection Time: 12/09/22  8:15 PM   Specimen: Anterior Nasal Swab  Result Value Ref Range Status   SARS Coronavirus 2 by RT PCR NEGATIVE NEGATIVE Final    Comment: (NOTE) SARS-CoV-2 target nucleic acids are NOT DETECTED.  The SARS-CoV-2 RNA is generally detectable in upper respiratory specimens during the acute phase of infection. The lowest concentration of SARS-CoV-2 viral copies this assay can detect is 138 copies/mL. A negative result does not preclude SARS-Cov-2 infection and should not be used as the sole basis for treatment or other patient management decisions. A negative result may occur with  improper specimen collection/handling, submission of specimen other than  nasopharyngeal swab, presence of viral mutation(s) within the areas targeted by this assay, and inadequate number of viral copies(<138 copies/mL). A negative result must be combined with clinical observations, patient history, and epidemiological information. The expected result is Negative.  Fact Sheet for Patients:  BloggerCourse.com  Fact Sheet for Healthcare Providers:  SeriousBroker.it  This test is no t  yet approved or cleared by the [place] and  has been authorized for detection and/or diagnosis of SARS-CoV-2 by FDA under an Emergency Use Authorization (EUA). This EUA will remain  in effect (meaning this test can be used) for the duration of the COVID-19 declaration under Section 564(b)(1) of the Act, 21 U.S.C.section 360bbb-3(b)(1), unless the authorization is terminated  or revoked sooner.       Influenza A by PCR NEGATIVE NEGATIVE Final   Influenza B by PCR NEGATIVE NEGATIVE Final    Comment: (NOTE) The Xpert Xpress SARS-CoV-2/FLU/RSV plus assay is intended as an aid in the diagnosis of influenza from Nasopharyngeal swab specimens and should not be used as a sole basis for treatment. Nasal washings and aspirates are unacceptable for Xpert Xpress SARS-CoV-2/FLU/RSV testing.  Fact Sheet for Patients: BloggerCourse.com  Fact Sheet for Healthcare Providers: SeriousBroker.it  This test is not yet approved or cleared by the [place] FDA and has been authorized for detection and/or diagnosis of SARS-CoV-2 by FDA under an Emergency Use Authorization (EUA). This EUA will remain in effect (meaning this test can be used) for the duration of the COVID-19 declaration under Section 564(b)(1) of the Act, 21 U.S.C. section 360bbb-3(b)(1), unless the authorization is terminated or revoked.     Resp Syncytial Virus by PCR NEGATIVE NEGATIVE Final    Comment:  (NOTE) Fact Sheet for Patients: BloggerCourse.com  Fact Sheet for Healthcare Providers: SeriousBroker.it  This test is not yet approved or cleared by the [place] FDA and has been authorized for detection and/or diagnosis of SARS-CoV-2 by FDA under an Emergency Use Authorization (EUA). This EUA will remain in effect (meaning this test can be used) for the duration of the COVID-19 declaration under Section 564(b)(1) of the Act, 21 U.S.C. section 360bbb-3(b)(1), unless the authorization is terminated or revoked.  Performed at [facility], [address] [place], [place] [address]   Urine Culture (for pregnant, neutropenic or urologic patients or patients with an indwelling urinary catheter)     Status: Abnormal   Collection Time: 12/13/22  1:25 PM   Specimen: Urine, Catheterized  Result Value Ref Range Status   Specimen Description   Final    URINE, CATHETERIZED Performed at [facility], [address], [place], [place] [address]    Special Requests   Final    NONE Performed at [facility], [address] [place], [place] [address]    Culture >=100,000 COLONIES/mL KLEBSIELLA PNEUMONIAE (A)  Final   Report Status 12/15/2022 FINAL  Final   Organism ID, Bacteria KLEBSIELLA PNEUMONIAE (A)  Final      Susceptibility   Klebsiella pneumoniae - MIC*    AMPICILLIN >=32 RESISTANT Resistant     CEFAZOLIN <=4 SENSITIVE Sensitive     CEFEPIME <=0.12 SENSITIVE Sensitive     CEFTRIAXONE <=0.25 SENSITIVE Sensitive     CIPROFLOXACIN <=0.25 SENSITIVE Sensitive     GENTAMICIN <=1 SENSITIVE Sensitive     IMIPENEM <=0.25 SENSITIVE Sensitive     NITROFURANTOIN 128 RESISTANT Resistant     TRIMETH/SULFA <=20 SENSITIVE Sensitive     AMPICILLIN/SULBACTAM 4 SENSITIVE Sensitive     PIP/TAZO <=4 SENSITIVE Sensitive ug/mL    * >=100,000 COLONIES/mL KLEBSIELLA PNEUMONIAE     Labs: BNP (last 3 results) Recent  Labs    03/25/22 2019 08/08/22 1011  BNP 598.2* 422.1*   Basic Metabolic Panel: Recent Labs  Lab 12/09/22 1832 12/11/22 0644 12/12/22 0246 12/13/22 0447  NA 135 138 137 137  K 4.3 4.6 4.8 4.2  CL 106 110 107 104  CO2 20* 22 22 24   GLUCOSE 179* 96 104* 104*  BUN 35* 33* 29* 32*  CREATININE 1.79* 2.06* 1.85* 1.81*  CALCIUM 8.7* 8.6* 8.5* 8.6*   Liver Function Tests: Recent Labs  Lab 12/09/22 1832  AST 15  ALT 8  ALKPHOS 53  BILITOT 0.7  PROT 6.5  ALBUMIN 4.2   Recent Labs  Lab 12/09/22 1832  LIPASE 27   No results for input(s): "AMMONIA" in the last 168 hours. CBC: Recent Labs  Lab 12/09/22 1832 12/10/22 0750 12/11/22 0644 12/12/22 0246 12/13/22 0447  WBC 7.8 4.8 5.8 7.3 5.9  HGB 10.7* 8.4* 8.8* 9.8* 9.5*  HCT 31.8* 24.9* 25.6* 28.4* 26.6*  MCV 90.1 88.6 85.9 85.8 84.2  PLT 131* 111* 116* 133* 132*   Cardiac Enzymes: No results for input(s): "CKTOTAL", "CKMB", "CKMBINDEX", "TROPONINI" in the last 168 hours. BNP: Invalid input(s): "POCBNP" CBG: No results for input(s): "GLUCAP" in the last 168 hours. D-Dimer No results for input(s): "DDIMER" in the last 72 hours. Hgb A1c No results for input(s): "HGBA1C" in the last 72 hours. Lipid Profile No results for input(s): "CHOL", "HDL", "LDLCALC", "TRIG", "CHOLHDL", "LDLDIRECT" in the last 72 hours. Thyroid function studies No results for input(s): "TSH", "T4TOTAL", "T3FREE", "THYROIDAB" in the last 72 hours.  Invalid input(s): "FREET3" Anemia work up No results for input(s): "VITAMINB12", "FOLATE", "FERRITIN", "TIBC", "IRON", "RETICCTPCT" in the last 72 hours. Urinalysis    Component Value Date/Time   COLORURINE YELLOW (A) 12/13/2022 1325   APPEARANCEUR CLOUDY (A) 12/13/2022 1325   APPEARANCEUR Hazy (A) 08/20/2022 1350   LABSPEC 1.018 12/13/2022 1325   PHURINE 5.0 12/13/2022 1325   GLUCOSEU NEGATIVE 12/13/2022 1325   GLUCOSEU NEGATIVE 12/25/2020 1556   HGBUR NEGATIVE 12/13/2022 1325    BILIRUBINUR NEGATIVE 12/13/2022 1325   BILIRUBINUR Negative 08/20/2022 1350   KETONESUR NEGATIVE 12/13/2022 1325   PROTEINUR 30 (A) 12/13/2022 1325   UROBILINOGEN 0.2 12/25/2020 1556   NITRITE POSITIVE (A) 12/13/2022 1325   LEUKOCYTESUR LARGE (A) 12/13/2022 1325   Sepsis Labs Recent Labs  Lab 12/10/22 0750 12/11/22 0644 12/12/22 0246 12/13/22 0447  WBC 4.8 5.8 7.3 5.9   Microbiology Recent Results (from the past 240 hour(s))  Resp panel by RT-PCR (RSV, Flu A&B, Covid) Anterior Nasal Swab     Status: None   Collection Time: 12/09/22  8:15 PM   Specimen: Anterior Nasal Swab  Result Value Ref Range Status   SARS Coronavirus 2 by RT PCR NEGATIVE NEGATIVE Final    Comment: (NOTE) SARS-CoV-2 target nucleic acids are NOT DETECTED.  The SARS-CoV-2 RNA is generally detectable in upper respiratory specimens during the acute phase of infection. The lowest concentration of SARS-CoV-2 viral copies this assay can detect is 138 copies/mL. A negative result does not preclude SARS-Cov-2 infection and should not be used as the sole basis for treatment or other patient management decisions. A negative result may occur with  improper specimen collection/handling, submission of specimen other than nasopharyngeal swab, presence of viral mutation(s) within the areas targeted by this assay, and inadequate number of viral copies(<138 copies/mL). A negative result must be combined with clinical observations, patient history, and epidemiological information. The expected result is Negative.  Fact Sheet for Patients:  BloggerCourse.com  Fact Sheet for Healthcare Providers:  SeriousBroker.it  This test is no t yet approved or cleared by the [place] FDA and  has been authorized for detection and/or diagnosis of SARS-CoV-2  by FDA under an Emergency Use Authorization (EUA). This EUA will remain  in effect (meaning this test can be used) for the  duration of the COVID-19 declaration under Section 564(b)(1) of the Act, 21 U.S.C.section 360bbb-3(b)(1), unless the authorization is terminated  or revoked sooner.       Influenza A by PCR NEGATIVE NEGATIVE Final   Influenza B by PCR NEGATIVE NEGATIVE Final    Comment: (NOTE) The Xpert Xpress SARS-CoV-2/FLU/RSV plus assay is intended as an aid in the diagnosis of influenza from Nasopharyngeal swab specimens and should not be used as a sole basis for treatment. Nasal washings and aspirates are unacceptable for Xpert Xpress SARS-CoV-2/FLU/RSV testing.  Fact Sheet for Patients: BloggerCourse.com  Fact Sheet for Healthcare Providers: SeriousBroker.it  This test is not yet approved or cleared by the [place] FDA and has been authorized for detection and/or diagnosis of SARS-CoV-2 by FDA under an Emergency Use Authorization (EUA). This EUA will remain in effect (meaning this test can be used) for the duration of the COVID-19 declaration under Section 564(b)(1) of the Act, 21 U.S.C. section 360bbb-3(b)(1), unless the authorization is terminated or revoked.     Resp Syncytial Virus by PCR NEGATIVE NEGATIVE Final    Comment: (NOTE) Fact Sheet for Patients: BloggerCourse.com  Fact Sheet for Healthcare Providers: SeriousBroker.it  This test is not yet approved or cleared by the [place] FDA and has been authorized for detection and/or diagnosis of SARS-CoV-2 by FDA under an Emergency Use Authorization (EUA). This EUA will remain in effect (meaning this test can be used) for the duration of the COVID-19 declaration under Section 564(b)(1) of the Act, 21 U.S.C. section 360bbb-3(b)(1), unless the authorization is terminated or revoked.  Performed at [facility], [address], [place], [place] [address]   Urine Culture (for pregnant, neutropenic or urologic  patients or patients with an indwelling urinary catheter)     Status: Abnormal   Collection Time: 12/13/22  1:25 PM   Specimen: Urine, Catheterized  Result Value Ref Range Status   Specimen Description   Final    URINE, CATHETERIZED Performed at [facility], [address] [place], [place] [address]    Special Requests   Final    NONE Performed at [facility], [address] [place], [place] [address]    Culture >=100,000 COLONIES/mL KLEBSIELLA PNEUMONIAE (A)  Final   Report Status 12/15/2022 FINAL  Final   Organism ID, Bacteria KLEBSIELLA PNEUMONIAE (A)  Final      Susceptibility   Klebsiella pneumoniae - MIC*    AMPICILLIN >=32 RESISTANT Resistant     CEFAZOLIN <=4 SENSITIVE Sensitive     CEFEPIME <=0.12 SENSITIVE Sensitive     CEFTRIAXONE <=0.25 SENSITIVE Sensitive     CIPROFLOXACIN <=0.25 SENSITIVE Sensitive     GENTAMICIN <=1 SENSITIVE Sensitive     IMIPENEM <=0.25 SENSITIVE Sensitive     NITROFURANTOIN 128 RESISTANT Resistant     TRIMETH/SULFA <=20 SENSITIVE Sensitive     AMPICILLIN/SULBACTAM 4 SENSITIVE Sensitive     PIP/TAZO <=4 SENSITIVE Sensitive ug/mL    * >=100,000 COLONIES/mL KLEBSIELLA PNEUMONIAE   Imaging CARDIAC CATHETERIZATION  Result Date: 12/11/2022   Dist LAD lesion is 50% stenosed.   Dist LM to Ost LAD lesion is 60% stenosed.   Dist RCA lesion is 65% stenosed.   1st Mrg lesion is 80% stenosed.   Mid Cx lesion is 70% stenosed.   1st Diag lesion is 75% stenosed.   Mid LAD lesion  is 20% stenosed.   Non-stenotic Prox RCA lesion was previously treated.   The left ventricular systolic function is normal.   The left ventricular ejection fraction is 35-45% by visual estimate. 1.  NSTEMI 2.  Patent stent mid LAD, patent stent proximal RCA 3.  80% stenosis small caliber OM1, 70% stenosis small caliber mid left circumflex, 75% stenosis small caliber ostium D1, 60-70% stenosis distal RCA 4.  Mildly reduced left ventricular function 5.  No definitive  culprit lesion Recommendations 1.  Medical therapy 2.  Continue dual antiplatelet therapy 3.  Continue aggressive risk factor modification 4.  Probable discharge home in a.m.   ECHOCARDIOGRAM COMPLETE  Result Date: 12/11/2022    ECHOCARDIOGRAM REPORT   Patient Name:   Shawn Andrews Date of Exam: 12/10/2022 Medical Rec #:  [id number]     Height:       70.0 in Accession #:    [id number]    Weight:       184.0 lb Date of Birth:  02-Mar-1928     BSA:          2.015 m Patient Age:    89 years      BP:           128/49 mmHg Patient Gender: M             HR:           56 bpm. Exam Location:  [facility] Procedure: 2D Echo, Cardiac Doppler, Color Doppler and Strain Analysis Indications:     NSTEMI I21.4  History:         Patient has prior history of Echocardiogram examinations, most                  recent 03/26/2022. CHF; Risk Factors:Dyslipidemia and                  Hypertension.  Sonographer:     Cristela Blue Referring Phys:  [id number] CARALYN HUDSON Diagnosing Phys: Marcina Millard MD  Sonographer Comments: Global longitudinal strain was attempted. IMPRESSIONS  1. Left ventricular ejection fraction, by estimation, is 45 to 50%. The left ventricle has mildly decreased function. The left ventricle demonstrates regional wall motion abnormalities (see scoring diagram/findings for description). Left ventricular diastolic parameters were normal.  2. Right ventricular systolic function is normal. The right ventricular size is normal.  3. The mitral valve is normal in structure. Mild mitral valve regurgitation. No evidence of mitral stenosis.  4. The aortic valve is normal in structure. Aortic valve regurgitation is trivial. No aortic stenosis is present.  5. The inferior vena cava is normal in size with greater than 50% respiratory variability, suggesting right atrial pressure of 3 mmHg. FINDINGS  Left Ventricle: Left ventricular ejection fraction, by estimation, is 45 to 50%. The left ventricle has mildly decreased function. The  left ventricle demonstrates regional wall motion abnormalities. The left ventricular internal cavity size was normal in size. There is no left ventricular hypertrophy. Left ventricular diastolic parameters were normal.  LV Wall Scoring: The apical lateral segment, apical septal segment, and apex are hypokinetic. Right Ventricle: The right ventricular size is normal. No increase in right ventricular wall thickness. Right ventricular systolic function is normal. Left Atrium: Left atrial size was normal in size. Right Atrium: Right atrial size was normal in size. Pericardium: There is no evidence of pericardial effusion. Mitral Valve: The mitral valve is normal in structure. Mild mitral valve regurgitation. No evidence of mitral valve stenosis. MV peak  gradient, 4.6 mmHg. The mean mitral valve gradient is 2.0 mmHg. Tricuspid Valve: The tricuspid valve is normal in structure. Tricuspid valve regurgitation is mild . No evidence of tricuspid stenosis. Aortic Valve: The aortic valve is normal in structure. Aortic valve regurgitation is trivial. No aortic stenosis is present. Aortic valve mean gradient measures 3.0 mmHg. Aortic valve peak gradient measures 5.5 mmHg. Aortic valve area, by VTI measures 2.99 cm. Pulmonic Valve: The pulmonic valve was normal in structure. Pulmonic valve regurgitation is not visualized. No evidence of pulmonic stenosis. Aorta: The aortic root is normal in size and structure. Venous: The inferior vena cava is normal in size with greater than 50% respiratory variability, suggesting right atrial pressure of 3 mmHg. IAS/Shunts: No atrial level shunt detected by color flow Doppler.  LEFT VENTRICLE PLAX 2D LVIDd:         5.50 cm      Diastology LVIDs:         3.80 cm      LV e' medial:    4.03 cm/s LV PW:         1.00 cm      LV E/e' medial:  23.7 LV IVS:        1.00 cm      LV e' lateral:   12.90 cm/s LVOT diam:     2.20 cm      LV E/e' lateral: 7.4 LV SV:         71 LV SV Index:   35 LVOT Area:      3.80 cm  LV Volumes (MOD) LV vol d, MOD A2C: 162.0 ml LV vol d, MOD A4C: 175.0 ml LV vol s, MOD A2C: 100.0 ml LV vol s, MOD A4C: 102.0 ml LV SV MOD A2C:     62.0 ml LV SV MOD A4C:     175.0 ml LV SV MOD BP:      69.5 ml RIGHT VENTRICLE RV Basal diam:  3.40 cm RV Mid diam:    2.10 cm RV S prime:     9.57 cm/s TAPSE (M-mode): 2.5 cm LEFT ATRIUM           Index        RIGHT ATRIUM           Index LA diam:      3.90 cm 1.94 cm/m   RA Area:     26.90 cm LA Vol (A2C): 55.3 ml 27.45 ml/m  RA Volume:   98.70 ml  48.99 ml/m LA Vol (A4C): 82.8 ml 41.10 ml/m  AORTIC VALVE AV Area (Vmax):    2.67 cm AV Area (Vmean):   2.75 cm AV Area (VTI):     2.99 cm AV Vmax:           117.00 cm/s AV Vmean:          73.900 cm/s AV VTI:            0.238 m AV Peak Grad:      5.5 mmHg AV Mean Grad:      3.0 mmHg LVOT Vmax:         82.20 cm/s LVOT Vmean:        53.400 cm/s LVOT VTI:          0.187 m LVOT/AV VTI ratio: 0.79  AORTA Ao Root diam: 3.10 cm MITRAL VALVE               TRICUSPID VALVE MV Area (PHT): 3.45 cm  TR Peak grad:   32.7 mmHg MV Area VTI:   2.09 cm    TR Vmax:        286.00 cm/s MV Peak grad:  4.6 mmHg MV Mean grad:  2.0 mmHg    SHUNTS MV Vmax:       1.07 m/s    Systemic VTI:  0.19 m MV Vmean:      60.9 cm/s   Systemic Diam: 2.20 cm MV Decel Time: 220 msec MV E velocity: 95.40 cm/s MV A velocity: 77.60 cm/s MV E/A ratio:  1.23 Marcina Millard MD Electronically signed by Marcina Millard MD Signature Date/Time: 12/11/2022/12:18:03 PM    Final       Time coordinating discharge: over 30 minutes  SIGNED:  Sunnie Nielsen DO [facility]

## 2022-12-17 ENCOUNTER — Encounter: Payer: Self-pay | Admitting: Urology

## 2022-12-17 ENCOUNTER — Ambulatory Visit: Payer: Medicare Other | Admitting: Urology

## 2022-12-17 VITALS — BP 115/56 | HR 66 | Ht 70.0 in | Wt 182.0 lb

## 2022-12-17 DIAGNOSIS — R339 Retention of urine, unspecified: Secondary | ICD-10-CM

## 2022-12-17 DIAGNOSIS — Z9359 Other cystostomy status: Secondary | ICD-10-CM

## 2023-01-01 ENCOUNTER — Ambulatory Visit: Payer: Medicare Other | Admitting: Urology

## 2023-01-27 NOTE — Progress Notes (Signed)
 Suprapubic Cath Change  Patient is present today for a suprapubic catheter change due to urinary retention.  8 ml of water was drained from the balloon, a 16 FR foley cath was removed from the tract with out difficulty.  Site was cleaned and prepped in a sterile fashion with betadine.  A 16 FR foley cath was replaced into the tract no complications were noted. Urine return was noted, 10 ml of sterile water was inflated into the balloon and a leg bag was attached for drainage.  Patient tolerated well. A night bag was given to patient and proper instruction was given on how to switch bags.    Performed by: CLOTILDA CORNWALL, PA-C   Follow up: In 6 weeks for SPT exchange

## 2023-01-29 ENCOUNTER — Ambulatory Visit: Payer: Medicare Other | Admitting: Urology

## 2023-01-29 ENCOUNTER — Encounter: Payer: Self-pay | Admitting: Urology

## 2023-01-29 VITALS — BP 137/77 | HR 66 | Ht 70.0 in | Wt 182.0 lb

## 2023-01-29 DIAGNOSIS — Z9359 Other cystostomy status: Secondary | ICD-10-CM

## 2023-01-29 DIAGNOSIS — R339 Retention of urine, unspecified: Secondary | ICD-10-CM

## 2023-03-10 NOTE — Progress Notes (Unsigned)
Suprapubic Cath Change  Patient is present today for a suprapubic catheter change due to urinary retention.  9 ml of water was drained from the balloon, a 16 FR foley cath was removed from the tract with out difficulty.  Site was cleaned and prepped in a sterile fashion with betadine.  A 16 FR foley cath was replaced into the tract no complications were noted. Urine return was noted, 10 ml of sterile water was inflated into the balloon and a leg bag was attached for drainage.  Patient tolerated well. A night bag was given to patient and proper instruction was given on how to switch bags.    Performed by: Michiel Cowboy, PA-C   Follow up: Return in about 1 month (around 04/09/2023) for SPT exchange .

## 2023-03-12 ENCOUNTER — Ambulatory Visit: Payer: Medicare Other | Admitting: Urology

## 2023-03-12 VITALS — BP 134/66 | HR 63

## 2023-03-12 DIAGNOSIS — R339 Retention of urine, unspecified: Secondary | ICD-10-CM

## 2023-03-12 DIAGNOSIS — Z9359 Other cystostomy status: Secondary | ICD-10-CM

## 2023-04-02 ENCOUNTER — Emergency Department (HOSPITAL_COMMUNITY)

## 2023-04-02 ENCOUNTER — Encounter (HOSPITAL_COMMUNITY): Payer: Self-pay

## 2023-04-02 ENCOUNTER — Inpatient Hospital Stay (HOSPITAL_COMMUNITY)

## 2023-04-02 ENCOUNTER — Other Ambulatory Visit: Payer: Self-pay

## 2023-04-02 ENCOUNTER — Inpatient Hospital Stay (HOSPITAL_COMMUNITY)
Admission: EM | Admit: 2023-04-02 | Discharge: 2023-04-22 | DRG: 085 | Disposition: E | Attending: Internal Medicine | Admitting: Internal Medicine

## 2023-04-02 DIAGNOSIS — N1832 Chronic kidney disease, stage 3b: Secondary | ICD-10-CM | POA: Diagnosis present

## 2023-04-02 DIAGNOSIS — T17908A Unspecified foreign body in respiratory tract, part unspecified causing other injury, initial encounter: Secondary | ICD-10-CM

## 2023-04-02 DIAGNOSIS — S12400A Unspecified displaced fracture of fifth cervical vertebra, initial encounter for closed fracture: Secondary | ICD-10-CM | POA: Diagnosis present

## 2023-04-02 DIAGNOSIS — N179 Acute kidney failure, unspecified: Secondary | ICD-10-CM

## 2023-04-02 DIAGNOSIS — Z880 Allergy status to penicillin: Secondary | ICD-10-CM

## 2023-04-02 DIAGNOSIS — I214 Non-ST elevation (NSTEMI) myocardial infarction: Secondary | ICD-10-CM | POA: Diagnosis not present

## 2023-04-02 DIAGNOSIS — F05 Delirium due to known physiological condition: Secondary | ICD-10-CM | POA: Diagnosis not present

## 2023-04-02 DIAGNOSIS — I2510 Atherosclerotic heart disease of native coronary artery without angina pectoris: Secondary | ICD-10-CM | POA: Diagnosis present

## 2023-04-02 DIAGNOSIS — Z23 Encounter for immunization: Secondary | ICD-10-CM | POA: Diagnosis present

## 2023-04-02 DIAGNOSIS — R0603 Acute respiratory distress: Secondary | ICD-10-CM | POA: Diagnosis not present

## 2023-04-02 DIAGNOSIS — R042 Hemoptysis: Secondary | ICD-10-CM | POA: Diagnosis present

## 2023-04-02 DIAGNOSIS — S12091D Other nondisplaced fracture of first cervical vertebra, subsequent encounter for fracture with routine healing: Secondary | ICD-10-CM | POA: Diagnosis not present

## 2023-04-02 DIAGNOSIS — Z7982 Long term (current) use of aspirin: Secondary | ICD-10-CM

## 2023-04-02 DIAGNOSIS — G250 Essential tremor: Secondary | ICD-10-CM | POA: Diagnosis present

## 2023-04-02 DIAGNOSIS — I5043 Acute on chronic combined systolic (congestive) and diastolic (congestive) heart failure: Secondary | ICD-10-CM | POA: Diagnosis not present

## 2023-04-02 DIAGNOSIS — R34 Anuria and oliguria: Secondary | ICD-10-CM | POA: Diagnosis not present

## 2023-04-02 DIAGNOSIS — G9341 Metabolic encephalopathy: Secondary | ICD-10-CM | POA: Diagnosis not present

## 2023-04-02 DIAGNOSIS — E7800 Pure hypercholesterolemia, unspecified: Secondary | ICD-10-CM | POA: Diagnosis present

## 2023-04-02 DIAGNOSIS — S129XXA Fracture of neck, unspecified, initial encounter: Secondary | ICD-10-CM | POA: Diagnosis not present

## 2023-04-02 DIAGNOSIS — Z515 Encounter for palliative care: Secondary | ICD-10-CM

## 2023-04-02 DIAGNOSIS — S51811A Laceration without foreign body of right forearm, initial encounter: Secondary | ICD-10-CM | POA: Diagnosis present

## 2023-04-02 DIAGNOSIS — N170 Acute kidney failure with tubular necrosis: Secondary | ICD-10-CM | POA: Diagnosis not present

## 2023-04-02 DIAGNOSIS — I513 Intracardiac thrombosis, not elsewhere classified: Secondary | ICD-10-CM | POA: Diagnosis not present

## 2023-04-02 DIAGNOSIS — I249 Acute ischemic heart disease, unspecified: Secondary | ICD-10-CM

## 2023-04-02 DIAGNOSIS — D631 Anemia in chronic kidney disease: Secondary | ICD-10-CM | POA: Diagnosis present

## 2023-04-02 DIAGNOSIS — S12091A Other nondisplaced fracture of first cervical vertebra, initial encounter for closed fracture: Secondary | ICD-10-CM | POA: Diagnosis present

## 2023-04-02 DIAGNOSIS — S0011XA Contusion of right eyelid and periocular area, initial encounter: Secondary | ICD-10-CM | POA: Diagnosis present

## 2023-04-02 DIAGNOSIS — S0181XA Laceration without foreign body of other part of head, initial encounter: Secondary | ICD-10-CM | POA: Diagnosis present

## 2023-04-02 DIAGNOSIS — Y92017 Garden or yard in single-family (private) house as the place of occurrence of the external cause: Secondary | ICD-10-CM

## 2023-04-02 DIAGNOSIS — Z86718 Personal history of other venous thrombosis and embolism: Secondary | ICD-10-CM

## 2023-04-02 DIAGNOSIS — I2489 Other forms of acute ischemic heart disease: Secondary | ICD-10-CM | POA: Diagnosis not present

## 2023-04-02 DIAGNOSIS — J189 Pneumonia, unspecified organism: Secondary | ICD-10-CM | POA: Diagnosis not present

## 2023-04-02 DIAGNOSIS — J90 Pleural effusion, not elsewhere classified: Secondary | ICD-10-CM | POA: Diagnosis present

## 2023-04-02 DIAGNOSIS — W19XXXA Unspecified fall, initial encounter: Secondary | ICD-10-CM | POA: Diagnosis not present

## 2023-04-02 DIAGNOSIS — Y93H1 Activity, digging, shoveling and raking: Secondary | ICD-10-CM | POA: Diagnosis not present

## 2023-04-02 DIAGNOSIS — J690 Pneumonitis due to inhalation of food and vomit: Secondary | ICD-10-CM | POA: Diagnosis not present

## 2023-04-02 DIAGNOSIS — R338 Other retention of urine: Secondary | ICD-10-CM | POA: Diagnosis not present

## 2023-04-02 DIAGNOSIS — I129 Hypertensive chronic kidney disease with stage 1 through stage 4 chronic kidney disease, or unspecified chronic kidney disease: Secondary | ICD-10-CM | POA: Diagnosis not present

## 2023-04-02 DIAGNOSIS — S12490A Other displaced fracture of fifth cervical vertebra, initial encounter for closed fracture: Secondary | ICD-10-CM

## 2023-04-02 DIAGNOSIS — K219 Gastro-esophageal reflux disease without esophagitis: Secondary | ICD-10-CM | POA: Diagnosis present

## 2023-04-02 DIAGNOSIS — Z66 Do not resuscitate: Secondary | ICD-10-CM | POA: Diagnosis not present

## 2023-04-02 DIAGNOSIS — I429 Cardiomyopathy, unspecified: Secondary | ICD-10-CM | POA: Diagnosis not present

## 2023-04-02 DIAGNOSIS — I252 Old myocardial infarction: Secondary | ICD-10-CM

## 2023-04-02 DIAGNOSIS — R1310 Dysphagia, unspecified: Secondary | ICD-10-CM | POA: Diagnosis not present

## 2023-04-02 DIAGNOSIS — Z8249 Family history of ischemic heart disease and other diseases of the circulatory system: Secondary | ICD-10-CM

## 2023-04-02 DIAGNOSIS — I5042 Chronic combined systolic (congestive) and diastolic (congestive) heart failure: Secondary | ICD-10-CM | POA: Diagnosis present

## 2023-04-02 DIAGNOSIS — M5416 Radiculopathy, lumbar region: Secondary | ICD-10-CM | POA: Diagnosis present

## 2023-04-02 DIAGNOSIS — E785 Hyperlipidemia, unspecified: Secondary | ICD-10-CM | POA: Diagnosis present

## 2023-04-02 DIAGNOSIS — I10 Essential (primary) hypertension: Secondary | ICD-10-CM | POA: Diagnosis present

## 2023-04-02 DIAGNOSIS — Z9359 Other cystostomy status: Secondary | ICD-10-CM

## 2023-04-02 DIAGNOSIS — D62 Acute posthemorrhagic anemia: Secondary | ICD-10-CM | POA: Diagnosis not present

## 2023-04-02 DIAGNOSIS — R040 Epistaxis: Secondary | ICD-10-CM | POA: Diagnosis present

## 2023-04-02 DIAGNOSIS — N390 Urinary tract infection, site not specified: Secondary | ICD-10-CM | POA: Diagnosis present

## 2023-04-02 DIAGNOSIS — M4726 Other spondylosis with radiculopathy, lumbar region: Secondary | ICD-10-CM | POA: Diagnosis present

## 2023-04-02 DIAGNOSIS — N400 Enlarged prostate without lower urinary tract symptoms: Secondary | ICD-10-CM | POA: Diagnosis present

## 2023-04-02 DIAGNOSIS — Z7902 Long term (current) use of antithrombotics/antiplatelets: Secondary | ICD-10-CM

## 2023-04-02 DIAGNOSIS — I130 Hypertensive heart and chronic kidney disease with heart failure and stage 1 through stage 4 chronic kidney disease, or unspecified chronic kidney disease: Secondary | ICD-10-CM | POA: Diagnosis present

## 2023-04-02 DIAGNOSIS — Z87891 Personal history of nicotine dependence: Secondary | ICD-10-CM

## 2023-04-02 DIAGNOSIS — I5020 Unspecified systolic (congestive) heart failure: Secondary | ICD-10-CM | POA: Diagnosis not present

## 2023-04-02 DIAGNOSIS — I25709 Atherosclerosis of coronary artery bypass graft(s), unspecified, with unspecified angina pectoris: Secondary | ICD-10-CM | POA: Diagnosis not present

## 2023-04-02 DIAGNOSIS — J9811 Atelectasis: Secondary | ICD-10-CM | POA: Diagnosis not present

## 2023-04-02 DIAGNOSIS — M47816 Spondylosis without myelopathy or radiculopathy, lumbar region: Secondary | ICD-10-CM | POA: Diagnosis present

## 2023-04-02 DIAGNOSIS — D72829 Elevated white blood cell count, unspecified: Secondary | ICD-10-CM | POA: Diagnosis not present

## 2023-04-02 DIAGNOSIS — I21A1 Myocardial infarction type 2: Secondary | ICD-10-CM | POA: Diagnosis not present

## 2023-04-02 DIAGNOSIS — G4700 Insomnia, unspecified: Secondary | ICD-10-CM | POA: Diagnosis present

## 2023-04-02 DIAGNOSIS — E870 Hyperosmolality and hypernatremia: Secondary | ICD-10-CM | POA: Diagnosis not present

## 2023-04-02 DIAGNOSIS — E872 Acidosis, unspecified: Secondary | ICD-10-CM | POA: Diagnosis not present

## 2023-04-02 DIAGNOSIS — D6832 Hemorrhagic disorder due to extrinsic circulating anticoagulants: Secondary | ICD-10-CM | POA: Diagnosis present

## 2023-04-02 DIAGNOSIS — Z955 Presence of coronary angioplasty implant and graft: Secondary | ICD-10-CM

## 2023-04-02 DIAGNOSIS — Z833 Family history of diabetes mellitus: Secondary | ICD-10-CM

## 2023-04-02 DIAGNOSIS — T83510A Infection and inflammatory reaction due to cystostomy catheter, initial encounter: Secondary | ICD-10-CM | POA: Diagnosis present

## 2023-04-02 DIAGNOSIS — I493 Ventricular premature depolarization: Secondary | ICD-10-CM | POA: Diagnosis not present

## 2023-04-02 DIAGNOSIS — S065X0A Traumatic subdural hemorrhage without loss of consciousness, initial encounter: Principal | ICD-10-CM | POA: Diagnosis present

## 2023-04-02 DIAGNOSIS — R7989 Other specified abnormal findings of blood chemistry: Secondary | ICD-10-CM | POA: Diagnosis present

## 2023-04-02 DIAGNOSIS — W1781XA Fall down embankment (hill), initial encounter: Secondary | ICD-10-CM | POA: Diagnosis not present

## 2023-04-02 DIAGNOSIS — Z79899 Other long term (current) drug therapy: Secondary | ICD-10-CM

## 2023-04-02 DIAGNOSIS — S0121XA Laceration without foreign body of nose, initial encounter: Secondary | ICD-10-CM | POA: Diagnosis present

## 2023-04-02 DIAGNOSIS — Z888 Allergy status to other drugs, medicaments and biological substances status: Secondary | ICD-10-CM

## 2023-04-02 DIAGNOSIS — T45525A Adverse effect of antithrombotic drugs, initial encounter: Secondary | ICD-10-CM | POA: Diagnosis present

## 2023-04-02 DIAGNOSIS — K5730 Diverticulosis of large intestine without perforation or abscess without bleeding: Secondary | ICD-10-CM | POA: Diagnosis present

## 2023-04-02 DIAGNOSIS — N183 Chronic kidney disease, stage 3 unspecified: Secondary | ICD-10-CM | POA: Diagnosis present

## 2023-04-02 DIAGNOSIS — S01511A Laceration without foreign body of lip, initial encounter: Secondary | ICD-10-CM | POA: Diagnosis present

## 2023-04-02 DIAGNOSIS — S81812A Laceration without foreign body, left lower leg, initial encounter: Secondary | ICD-10-CM | POA: Diagnosis present

## 2023-04-02 DIAGNOSIS — S12490D Other displaced fracture of fifth cervical vertebra, subsequent encounter for fracture with routine healing: Secondary | ICD-10-CM | POA: Diagnosis not present

## 2023-04-02 DIAGNOSIS — I959 Hypotension, unspecified: Secondary | ICD-10-CM | POA: Diagnosis not present

## 2023-04-02 DIAGNOSIS — Z808 Family history of malignant neoplasm of other organs or systems: Secondary | ICD-10-CM

## 2023-04-02 DIAGNOSIS — T508X5A Adverse effect of diagnostic agents, initial encounter: Secondary | ICD-10-CM | POA: Diagnosis not present

## 2023-04-02 DIAGNOSIS — N401 Enlarged prostate with lower urinary tract symptoms: Secondary | ICD-10-CM | POA: Diagnosis present

## 2023-04-02 DIAGNOSIS — Z96651 Presence of right artificial knee joint: Secondary | ICD-10-CM | POA: Diagnosis present

## 2023-04-02 DIAGNOSIS — S065XAA Traumatic subdural hemorrhage with loss of consciousness status unknown, initial encounter: Secondary | ICD-10-CM | POA: Diagnosis present

## 2023-04-02 DIAGNOSIS — W19XXXD Unspecified fall, subsequent encounter: Secondary | ICD-10-CM | POA: Diagnosis not present

## 2023-04-02 DIAGNOSIS — B9689 Other specified bacterial agents as the cause of diseases classified elsewhere: Secondary | ICD-10-CM | POA: Diagnosis present

## 2023-04-02 DIAGNOSIS — K921 Melena: Secondary | ICD-10-CM | POA: Diagnosis present

## 2023-04-02 DIAGNOSIS — S12001A Unspecified nondisplaced fracture of first cervical vertebra, initial encounter for closed fracture: Secondary | ICD-10-CM

## 2023-04-02 DIAGNOSIS — N1411 Contrast-induced nephropathy: Secondary | ICD-10-CM | POA: Diagnosis not present

## 2023-04-02 LAB — COMPREHENSIVE METABOLIC PANEL
ALT: 15 U/L (ref 0–44)
AST: 21 U/L (ref 15–41)
Albumin: 3.6 g/dL (ref 3.5–5.0)
Alkaline Phosphatase: 49 U/L (ref 38–126)
Anion gap: 9 (ref 5–15)
BUN: 28 mg/dL — ABNORMAL HIGH (ref 8–23)
CO2: 22 mmol/L (ref 22–32)
Calcium: 8.3 mg/dL — ABNORMAL LOW (ref 8.9–10.3)
Chloride: 108 mmol/L (ref 98–111)
Creatinine, Ser: 1.85 mg/dL — ABNORMAL HIGH (ref 0.61–1.24)
GFR, Estimated: 34 mL/min — ABNORMAL LOW (ref >60)
Glucose, Bld: 130 mg/dL — ABNORMAL HIGH (ref 70–99)
Potassium: 4.5 mmol/L (ref 3.5–5.1)
Sodium: 139 mmol/L (ref 135–145)
Total Bilirubin: 0.3 mg/dL (ref 0.0–1.2)
Total Protein: 5.9 g/dL — ABNORMAL LOW (ref 6.5–8.1)

## 2023-04-02 LAB — PROTIME-INR
INR: 1.2 (ref 0.8–1.2)
Prothrombin Time: 15.4 s — ABNORMAL HIGH (ref 11.4–15.2)

## 2023-04-02 LAB — I-STAT CHEM 8, ED
BUN: 29 mg/dL — ABNORMAL HIGH (ref 8–23)
Calcium, Ion: 1.02 mmol/L — ABNORMAL LOW (ref 1.15–1.40)
Chloride: 108 mmol/L (ref 98–111)
Creatinine, Ser: 2 mg/dL — ABNORMAL HIGH (ref 0.61–1.24)
Glucose, Bld: 120 mg/dL — ABNORMAL HIGH (ref 70–99)
HCT: 28 % — ABNORMAL LOW (ref 39.0–52.0)
Hemoglobin: 9.5 g/dL — ABNORMAL LOW (ref 13.0–17.0)
Potassium: 4.5 mmol/L (ref 3.5–5.1)
Sodium: 137 mmol/L (ref 135–145)
TCO2: 21 mmol/L — ABNORMAL LOW (ref 22–32)

## 2023-04-02 LAB — CBC
HCT: 32.3 % — ABNORMAL LOW (ref 39.0–52.0)
HCT: 22.5 % — ABNORMAL LOW (ref 39.0–52.0)
Hemoglobin: 10.9 g/dL — ABNORMAL LOW (ref 13.0–17.0)
Hemoglobin: 7.5 g/dL — ABNORMAL LOW (ref 13.0–17.0)
MCH: 29.9 pg (ref 26.0–34.0)
MCH: 29.8 pg (ref 26.0–34.0)
MCHC: 33.7 g/dL (ref 30.0–36.0)
MCHC: 33.3 g/dL (ref 30.0–36.0)
MCV: 88.7 fL (ref 80.0–100.0)
MCV: 89.3 fL (ref 80.0–100.0)
Platelets: 178 10*3/uL (ref 150–400)
Platelets: 259 10*3/uL (ref 150–400)
RBC: 3.64 MIL/uL — ABNORMAL LOW (ref 4.22–5.81)
RBC: 2.52 MIL/uL — ABNORMAL LOW (ref 4.22–5.81)
RDW: 13.5 % (ref 11.5–15.5)
RDW: 13.6 % (ref 11.5–15.5)
WBC: 7.2 10*3/uL (ref 4.0–10.5)
WBC: 16.3 10*3/uL — ABNORMAL HIGH (ref 4.0–10.5)
nRBC: 0 % (ref 0.0–0.2)
nRBC: 0 % (ref 0.0–0.2)

## 2023-04-02 LAB — URINALYSIS, ROUTINE W REFLEX MICROSCOPIC
Bilirubin Urine: NEGATIVE
Glucose, UA: NEGATIVE mg/dL
Hgb urine dipstick: NEGATIVE
Ketones, ur: NEGATIVE mg/dL
Nitrite: NEGATIVE
Protein, ur: 100 mg/dL — AB
Specific Gravity, Urine: 1.021 (ref 1.005–1.030)
WBC, UA: >50 WBC/hpf (ref 0–5)
pH: 7 (ref 5.0–8.0)

## 2023-04-02 LAB — PREPARE RBC (CROSSMATCH)

## 2023-04-02 LAB — SAMPLE TO BLOOD BANK

## 2023-04-02 LAB — ETHANOL: Alcohol, Ethyl (B): <10 mg/dL (ref <10)

## 2023-04-02 LAB — ABO/RH: ABO/RH(D): A NEG

## 2023-04-02 LAB — I-STAT CG4 LACTIC ACID, ED: Lactic Acid, Venous: 2 mmol/L (ref 0.5–1.9)

## 2023-04-02 MED ORDER — ROSUVASTATIN CALCIUM 20 MG PO TABS
20.0000 mg | ORAL_TABLET | Freq: Every day | ORAL | Status: DC
  Administered 2023-04-03 – 2023-04-06 (×4): 20 mg via ORAL
  Filled 2023-04-02 (×4): qty 1

## 2023-04-02 MED ORDER — ONDANSETRON HCL 4 MG/2ML IJ SOLN
4.0000 mg | Freq: Once | INTRAMUSCULAR | Status: AC
  Administered 2023-04-02: 4 mg via INTRAVENOUS
  Filled 2023-04-02: qty 2

## 2023-04-02 MED ORDER — TETANUS-DIPHTH-ACELL PERTUSSIS 5-2.5-18.5 LF-MCG/0.5 IM SUSY: 0.5000 mL | PREFILLED_SYRINGE | Freq: Once | INTRAMUSCULAR | Status: AC

## 2023-04-02 MED ORDER — MORPHINE SULFATE (PF) 4 MG/ML IV SOLN
4.0000 mg | Freq: Once | INTRAVENOUS | Status: AC
  Administered 2023-04-02: 4 mg via INTRAVENOUS
  Filled 2023-04-02: qty 1

## 2023-04-02 MED ORDER — HYDROMORPHONE HCL 1 MG/ML IJ SOLN
0.5000 mg | INTRAMUSCULAR | Status: DC | PRN
  Administered 2023-04-02 – 2023-04-07 (×3): 0.5 mg via INTRAVENOUS
  Filled 2023-04-02: qty 1
  Filled 2023-04-02 (×2): qty 0.5

## 2023-04-02 MED ORDER — OXIDIZED CELLULOSE EX PADS
1.0000 | MEDICATED_PAD | Freq: Once | CUTANEOUS | Status: DC
  Filled 2023-04-02: qty 1

## 2023-04-02 MED ORDER — DOCUSATE SODIUM 100 MG PO CAPS
100.0000 mg | ORAL_CAPSULE | Freq: Every day | ORAL | Status: DC
  Administered 2023-04-03 – 2023-04-07 (×4): 100 mg via ORAL
  Filled 2023-04-02 (×5): qty 1

## 2023-04-02 MED ORDER — VITAMIN B-12 1000 MCG PO TABS
1000.0000 ug | ORAL_TABLET | Freq: Every day | ORAL | Status: DC
  Administered 2023-04-03 – 2023-04-07 (×5): 1000 ug via ORAL
  Filled 2023-04-02 (×5): qty 1

## 2023-04-02 MED ORDER — LACTATED RINGERS IV BOLUS
1000.0000 mL | Freq: Once | INTRAVENOUS | Status: AC
  Administered 2023-04-02: 1000 mL via INTRAVENOUS

## 2023-04-02 MED ORDER — SODIUM CHLORIDE 0.9 % IV BOLUS
500.0000 mL | Freq: Once | INTRAVENOUS | Status: AC
  Administered 2023-04-02: 500 mL via INTRAVENOUS

## 2023-04-02 MED ORDER — CLOPIDOGREL BISULFATE 75 MG PO TABS
75.0000 mg | ORAL_TABLET | Freq: Every day | ORAL | Status: DC
  Administered 2023-04-03 – 2023-04-07 (×5): 75 mg via ORAL
  Filled 2023-04-02 (×5): qty 1

## 2023-04-02 MED ORDER — LIDOCAINE-EPINEPHRINE (PF) 2 %-1:200000 IJ SOLN
10.0000 mL | Freq: Once | INTRAMUSCULAR | Status: AC
  Administered 2023-04-02: 10 mL
  Filled 2023-04-02: qty 20

## 2023-04-02 MED ORDER — PRIMIDONE 50 MG PO TABS
100.0000 mg | ORAL_TABLET | Freq: Three times a day (TID) | ORAL | Status: DC
  Administered 2023-04-03 – 2023-04-07 (×15): 100 mg via ORAL
  Filled 2023-04-02 (×19): qty 2

## 2023-04-02 MED ORDER — FINASTERIDE 5 MG PO TABS
5.0000 mg | ORAL_TABLET | Freq: Every day | ORAL | Status: DC
  Administered 2023-04-03 – 2023-04-07 (×5): 5 mg via ORAL
  Filled 2023-04-02 (×5): qty 1

## 2023-04-02 MED ORDER — ASPIRIN 81 MG PO CHEW: 81.0000 mg | CHEWABLE_TABLET | Freq: Every day | ORAL | Status: DC

## 2023-04-02 MED ORDER — IOHEXOL 350 MG/ML SOLN
75.0000 mL | Freq: Once | INTRAVENOUS | Status: AC | PRN
  Administered 2023-04-02: 75 mL via INTRAVENOUS

## 2023-04-02 MED ORDER — FAMOTIDINE 20 MG PO TABS
20.0000 mg | ORAL_TABLET | Freq: Every day | ORAL | Status: DC
  Administered 2023-04-03 – 2023-04-06 (×4): 20 mg via ORAL
  Filled 2023-04-02 (×4): qty 1

## 2023-04-02 MED ORDER — ACETAMINOPHEN 325 MG PO TABS: 650.0000 mg | ORAL_TABLET | Freq: Four times a day (QID) | ORAL | Status: DC | PRN

## 2023-04-02 MED ORDER — TETANUS-DIPHTH-ACELL PERTUSSIS 5-2.5-18.5 LF-MCG/0.5 IM SUSY
PREFILLED_SYRINGE | INTRAMUSCULAR | Status: AC
  Administered 2023-04-02: 0.5 mL via INTRAMUSCULAR
  Filled 2023-04-02: qty 0.5

## 2023-04-02 MED ORDER — SUCRALFATE 1 GM/10ML PO SUSP
1.0000 g | Freq: Two times a day (BID) | ORAL | Status: DC
  Administered 2023-04-03: 1 g via ORAL
  Filled 2023-04-02 (×3): qty 10

## 2023-04-02 MED ORDER — PANTOPRAZOLE SODIUM 40 MG PO TBEC
40.0000 mg | DELAYED_RELEASE_TABLET | Freq: Two times a day (BID) | ORAL | Status: DC
  Administered 2023-04-03 – 2023-04-07 (×10): 40 mg via ORAL
  Filled 2023-04-02 (×10): qty 1

## 2023-04-02 MED ORDER — SODIUM CHLORIDE 0.9% IV SOLUTION
Freq: Once | INTRAVENOUS | Status: AC
Start: 2023-04-02 — End: 2023-04-03

## 2023-04-02 MED ORDER — BACITRACIN ZINC 500 UNIT/GM EX OINT
TOPICAL_OINTMENT | CUTANEOUS | Status: AC
  Filled 2023-04-02: qty 1.8

## 2023-04-02 NOTE — Hospital Course (Addendum)
 Mechanical fall Cervical fractures at C1 and C5 3 mm para falcine subdural hematoma, stable  Large Forehead Laceration (repaired) and hematoma  Facial Abrasions  Presented to ED after mechanical fall this AM without any prodromal symptoms sustaining subdural hematoma, lateral anterior scalp, forehead and periorbital hematomas, forehead laceration, acute displaced fractures of C1 and C5 and facial abrasions. EDP provider repaired forehead laceration, that appears to be still draining. Repeat CTH without any acute changes. NSGY and Trauma on board. - No surgical intervention at this time per NSGY - If any changes to mental status, get STAT CTH and notify NSGY  - PT/OT when able  - MBS 3/13 --> DYS 3  - PRN Dilaudid q4h and PO oxy 2.5-5 q4h    Epistaxis s/p R nares cautery  CT Maxillofacial without any evidence of acute maxillofacial fracture. ENT consulted 3/12 for posterior oropharynx bleed. S/p R nare cautery. Stable.    Acute blood loss anemia  Hx Anemia of chronic kidney disease  Hgb dropped from 10.9 to 7.5 on 3/12; s/p 1 PRBC. - Hgb stable this AM, Hgb 8.3 (8.0) - No signs of active bleed - Monitor CBC    Leukocytosis WBC trending high, WBC 21.9 (19.2), initially came in with WBC 7.2; had a fever 100.4 on 3/13; UA +leukocytes.  - Follow up on Blood cultures x 2    Concern for black stool Had two episodes of melena 3/13. Low suspicion for GI bleed at this time. Likely from digested oropharyn bleed from 3/12. - Repeat Hgbs stable.  - Continue to monitor    CAD s/p multiple stents  NSTEMI s/p two overlapping DES stent to proximal/mid LAD (09/2020), stent to proximal RCA and stent to in-stent restenosis of mid LAD (03/2022). Previously admitted 12/09/2022 for chest pain, NSTEMI w/ elevated troponin levels. 12/11/2022 LHC with patent stents and no definite culprit lesion. Managed medically. Concern about chest tightness, chest pain, diaphoresis 3/13, EKG with out acute changes compared  to previous EKG findings. Trop 170>253>421>[id number]. Considering demand ischemia in the setting of acute bleed. Consulted Cards, appreciate their input, continue medical management.  - Continue Plavix 75 mg daily  - PRN Nitroglycerin SL tablet 0.4 mg    CKD stage 3B, Baseline Scr ~1.8 BPH Indwelling Suprapubic catheter 2/2 urinary retention  UTI  Follows Urology and had it replaced on 03/12/2023, is supposed to get it exchanged every 6 weeks. UA +large leukocytes with Triple Phosphate crystal suspicious for possible Klebsiella. His previous UA 11/225 grew Klebsiella resistant to ampicillin and nitrofurantoin treated with Cipro. During exam, abdominal tenderness noted.  - Follow up UA culture      - Start Rocephin 2 g today for UTI  - Continue Finasteride 5 mg daily  - Avoid nephrotoxic agents   Chronic HFrEF Hypertension Last ECHO 11/2022 showed EF 45 to 50% with regional wall motion abnormalities and normal RV function. Home medications consist of metoprolol 25 mg twice daily, Imdur 60 mg twice daily, hydralazine 50 mg 3 times daily, Lasix 20 mg every other day. -Hold home GDMT in the setting of hypotension and active bleed   Essential tremors  -Continue home medication primidone 100 mg TID    GERD -Continue home medications: PPI BID, Pepcid 20 mg at bedtime, Sulcrafate 1g BID before meals.    Diverticulosis CTAP noted diverticulosis of descending and sigmoid colon is noted without inflammation. Bowel regimen.    Hyperlipidemia Continue home medication Crestor   ========================================================== Shawn Andrews,    You came to  the hospital for ______.   These are the changes to medications:     If you have any of these following symptoms, please call us or seek care at an emergency department: -Chest Pain -Difficulty Breathing -Worsening abdominal pain -Syncope (passing out) -Drooping of face -Slurred speech -Sudden weakness in your leg or  arm -Fever -Chills -blood in the stool -dark black, sticky stool  If you have any questions or concerns, call our clinic at [phone] or after hours call [phone] and ask for the internal medicine resident on call.  I am glad you are feeling better. It was a pleasure taking care for you. I wish a good recovery and good health!   Dr. Jeral Pinch    ==================================================================

## 2023-04-02 NOTE — Progress Notes (Signed)
 Transition of Care [facility]) - CAGE-AID Screening   Patient Details  Name: EATHON VALADE MRN: [id number] Date of Birth: 1928/01/26  [facility]) CM/SW Contact:    Katha Hamming, RN Phone Number: 04/02/2023, 8:19 PM   Clinical Narrative:  Reports occasional beer, no resources indicated.  CAGE-AID Screening:    Have You Ever Felt You Ought to Cut Down on Your Drinking or Drug Use?: No Have People Annoyed You By Critizing Your Drinking Or Drug Use?: No Have You Felt Bad Or Guilty About Your Drinking Or Drug Use?: No Have You Ever Had a Drink or Used Drugs First Thing In The Morning to Steady Your Nerves or to Get Rid of a Hangover?: No CAGE-AID Score: 0  Substance Abuse Education Offered: No

## 2023-04-02 NOTE — Consult Note (Signed)
 Reason for Consult:epistaxis Referring Physician: Dr Charolotte Capuchin is an 89 y.o. male.  HPI: hx of a fall and has multiple abrasions and some ICB. He persists with bleeding from the right nose. Mostly down throat. No facial fractures  Past Medical History:  Diagnosis Date   Abnormal drug screen 06/22/2014   inapprop neg xanax rpt 3 mo (06/2014)   BPH (benign prostatic hypertrophy) 01/21/1998   has had 3 biopsies in past ([facility]) decided to stop PSA/DRE   CHF (congestive heart failure) (HCC)    Coronary artery disease    Hyperlipidemia 01/21/2002   Hypertension 05/22/2003   Left lumbar radiculopathy    Osteoarthritis 01/22/1988   knees, lumbar spondylosis and listhesis    Past Surgical History:  Procedure Laterality Date   CATARACT EXTRACTION  2013   bilateral   CORONARY ANGIOGRAPHY N/A 03/29/2022   Procedure: CORONARY ANGIOGRAPHY;  Surgeon: Alwyn Pea, MD;  Location: [facility];  Service: Cardiovascular;  Laterality: N/A;   CORONARY STENT INTERVENTION N/A 03/27/2022   Procedure: CORONARY STENT INTERVENTION;  Surgeon: Alwyn Pea, MD;  Location: [facility];  Service: Cardiovascular;  Laterality: N/A;   CORONARY STENT INTERVENTION N/A 03/29/2022   Procedure: CORONARY STENT INTERVENTION;  Surgeon: Alwyn Pea, MD;  Location: [facility];  Service: Cardiovascular;  Laterality: N/A;   ESI Left 08/2014, 11/2014, 08/2015   L S1, L L5/S1 transforaminal ESI; L4/5 L5/S1 zygapophysial injections, L S1 transforaminal ([facility])   FINGER SURGERY     right middle, partial traumatic amputation   IR CATHETER TUBE CHANGE  03/02/2021   KNEE ARTHROSCOPY  1990   right   KNEE SURGERY  04/2006   right partial knee replacement in [place] - rec ppx abx for any invasive procedure   LEFT HEART CATH AND CORONARY ANGIOGRAPHY N/A 01/18/2021   Procedure: LEFT HEART CATH AND CORONARY ANGIOGRAPHY;  Surgeon: Lamar Blinks, MD;  Location: [facility];  Service: Cardiovascular;  Laterality: N/A;   LEFT HEART CATH AND CORONARY ANGIOGRAPHY N/A 03/27/2022   Procedure: LEFT HEART CATH AND CORONARY ANGIOGRAPHY;  Surgeon: Alwyn Pea, MD;  Location: [facility];  Service: Cardiovascular;  Laterality: N/A;   LEFT HEART CATH AND CORONARY ANGIOGRAPHY N/A 12/11/2022   Procedure: LEFT HEART CATH AND CORONARY ANGIOGRAPHY;  Surgeon: Marcina Millard, MD;  Location: [facility];  Service: Cardiovascular;  Laterality: N/A;    Family History  Problem Relation Age of Onset   Hypertension Mother    Heart disease Brother 76       MI   Diabetes Brother    Cancer Daughter        melanoma   Stroke Neg Hx     Social History:  reports that he quit smoking about 22 years ago. His smoking use included pipe and cigarettes. He has been exposed to tobacco smoke. His smokeless tobacco use includes chew. He reports current alcohol use. He reports that he does not use drugs.  Allergies:  Allergies  Allergen Reactions   Penicillins Swelling    Of tongue Has patient had a PCN reaction causing immediate rash, facial/tongue/throat swelling, SOB or lightheadedness with hypotension: Yes Has patient had a PCN reaction causing severe rash involving mucus membranes or skin necrosis: No Has patient had a PCN reaction that required hospitalization: No Has patient had a PCN reaction occurring within the last 10 years: No If all of the above answers are "NO",  then may proceed with Cephalosporin use.   Cialis [Tadalafil] Other (See Comments)    Indigestion   Lipitor [Atorvastatin] Other (See Comments)    Severe Myalgias   Lisinopril Cough    Medications: I have reviewed the patient's current medications.  Results for orders placed or performed during the hospital encounter of 04/02/23 (from the past 48 hours)  Comprehensive metabolic panel     Status: Abnormal   Collection Time: 04/02/23 11:20 AM  Result Value Ref Range   Sodium 139  135 - 145 mmol/L   Potassium 4.5 3.5 - 5.1 mmol/L   Chloride 108 98 - 111 mmol/L   CO2 22 22 - 32 mmol/L   Glucose, Bld 130 (H) 70 - 99 mg/dL    Comment: Glucose reference range applies only to samples taken after fasting for at least 8 hours.   BUN 28 (H) 8 - 23 mg/dL   Creatinine, Ser 1.91 (H) 0.61 - 1.24 mg/dL   Calcium 8.3 (L) 8.9 - 10.3 mg/dL   Total Protein 5.9 (L) 6.5 - 8.1 g/dL   Albumin 3.6 3.5 - 5.0 g/dL   AST 21 15 - 41 U/L   ALT 15 0 - 44 U/L   Alkaline Phosphatase 49 38 - 126 U/L   Total Bilirubin 0.3 0.0 - 1.2 mg/dL   GFR, Estimated 34 (L) >60 mL/min    Comment: (NOTE) Calculated using the CKD-EPI Creatinine Equation (2021)    Anion gap 9 5 - 15    Comment: Performed at [facility], [address] [place], [place] [address]  CBC     Status: Abnormal   Collection Time: 04/02/23 11:20 AM  Result Value Ref Range   WBC 7.2 4.0 - 10.5 K/uL   RBC 3.64 (L) 4.22 - 5.81 MIL/uL   Hemoglobin 10.9 (L) 13.0 - 17.0 g/dL   HCT 56.2 (L) 13.0 - 86.5 %   MCV 88.7 80.0 - 100.0 fL   MCH 29.9 26.0 - 34.0 pg   MCHC 33.7 30.0 - 36.0 g/dL   RDW 78.4 69.6 - 29.5 %   Platelets 178 150 - 400 K/uL   nRBC 0.0 0.0 - 0.2 %    Comment: Performed at [facility], [address], [place], [place] [address]  Ethanol     Status: None   Collection Time: 04/02/23 11:20 AM  Result Value Ref Range   Alcohol, Ethyl (B) <10 <10 mg/dL    Comment: (NOTE) Lowest detectable limit for serum alcohol is 10 mg/dL.  For medical purposes only. Performed at [facility], [address], [place], [place] [address]   Urinalysis, Routine w reflex microscopic -Urine, Clean Catch     Status: Abnormal   Collection Time: 04/02/23 11:20 AM  Result Value Ref Range   Color, Urine YELLOW YELLOW   APPearance TURBID (A) CLEAR   Specific Gravity, Urine 1.021 1.005 - 1.030   pH 7.0 5.0 - 8.0   Glucose, UA NEGATIVE NEGATIVE mg/dL   Hgb urine dipstick NEGATIVE NEGATIVE   Bilirubin Urine NEGATIVE  NEGATIVE   Ketones, ur NEGATIVE NEGATIVE mg/dL   Protein, ur 027 (A) NEGATIVE mg/dL   Nitrite NEGATIVE NEGATIVE   Leukocytes,Ua LARGE (A) NEGATIVE   RBC / HPF 6-10 0 - 5 RBC/hpf   WBC, UA >50 0 - 5 WBC/hpf   Bacteria, UA RARE (A) NONE SEEN   Squamous Epithelial / HPF 0-5 0 - 5 /HPF   Mucus PRESENT  Triple Phosphate Crystal PRESENT     Comment: Performed at [facility], [address] [place], [place] [address]  Protime-INR     Status: Abnormal   Collection Time: 04/02/23 11:20 AM  Result Value Ref Range   Prothrombin Time 15.4 (H) 11.4 - 15.2 seconds   INR 1.2 0.8 - 1.2    Comment: (NOTE) INR goal varies based on device and disease states. Performed at [facility], [address], [place], [place] [address]   Sample to Blood Bank     Status: None   Collection Time: 04/02/23 11:20 AM  Result Value Ref Range   Blood Bank Specimen SAMPLE AVAILABLE FOR TESTING    Sample Expiration      04/05/2023,2359 Performed at [facility], [address], [place], [place] [address]   I-Stat Chem 8, ED     Status: Abnormal   Collection Time: 04/02/23 11:30 AM  Result Value Ref Range   Sodium 137 135 - 145 mmol/L   Potassium 4.5 3.5 - 5.1 mmol/L   Chloride 108 98 - 111 mmol/L   BUN 29 (H) 8 - 23 mg/dL   Creatinine, Ser 4.33 (H) 0.61 - 1.24 mg/dL   Glucose, Bld 295 (H) 70 - 99 mg/dL    Comment: Glucose reference range applies only to samples taken after fasting for at least 8 hours.   Calcium, Ion 1.02 (L) 1.15 - 1.40 mmol/L   TCO2 21 (L) 22 - 32 mmol/L   Hemoglobin 9.5 (L) 13.0 - 17.0 g/dL   HCT 18.8 (L) 41.6 - 60.6 %  I-Stat Lactic Acid, ED     Status: Abnormal   Collection Time: 04/02/23 11:30 AM  Result Value Ref Range   Lactic Acid, Venous 2.0 (HH) 0.5 - 1.9 mmol/L    CT Angio Neck W and/or Wo Contrast Result Date: 04/02/2023 CLINICAL DATA:  Neck trauma, arterial injury suspected. EXAM: CT ANGIOGRAPHY NECK TECHNIQUE: Multidetector CT imaging of the neck was  performed using the standard protocol during bolus administration of intravenous contrast. Multiplanar CT image reconstructions and MIPs were obtained to evaluate the vascular anatomy. Carotid stenosis measurements (when applicable) are obtained utilizing NASCET criteria, using the distal internal carotid diameter as the denominator. RADIATION DOSE REDUCTION: This exam was performed according to the departmental dose-optimization program which includes automated exposure control, adjustment of the mA and/or kV according to patient size and/or use of iterative reconstruction technique. CONTRAST:  75mL OMNIPAQUE IOHEXOL 350 MG/ML SOLN COMPARISON:  Cervical spine CT 04/02/2023 FINDINGS: Aortic arch: Standard branching with mild atherosclerosis. No significant stenosis of the arch vessel origins. Right carotid system: Patent with extensive calcified plaque in the carotid bulb resulting in 65% proximal ICA stenosis. Tortuous mid cervical ICA. No evidence of dissection. Left carotid system: Patent with a moderate amount of calcified plaque about the carotid bifurcation. No evidence of a significant stenosis or dissection. Tortuous distal cervical ICA. Vertebral arteries: Patent without evidence of a significant stenosis or dissection of the dominant left vertebral artery. In the right V3 segment at the C2 level, there is an apparent 4 mm long segment of diminished vessel opacification and severe stenosis, possibly with an intimal flap or thrombus (series 8, images 91 and 92), although streak artifact in this region could also be contributing to this appearance with mild artifact noted in the left vertebral artery at this same level. The right V3 segment is widely patent more distally. No pseudoaneurysm. Skeleton: Left C1 and C5  transverse process fractures and C5 spinous process fracture as described on today's earlier spine CT. Other neck: No evidence of cervical lymphadenopathy or mass. Upper chest: More fully evaluated  on today's earlier chest CT. IMPRESSION: 1. Focally abnormal appearance of the right vertebral artery at the C2 level with apparent severe stenosis. In the setting of trauma, this is suspicious for grade 2 blunt cerebrovascular injury although streak artifact may also be contributing. 2. No evidence of acute arterial injury elsewhere in the neck. 3. 65% proximal right ICA stenosis. 4.  Aortic Atherosclerosis (ICD10-I70.0). Electronically Signed   By: Sebastian Ache M.D.   On: 04/02/2023 17:23   CT CHEST ABDOMEN PELVIS WO CONTRAST Result Date: 04/02/2023 CLINICAL DATA:  Fall. EXAM: CT CHEST, ABDOMEN AND PELVIS WITHOUT CONTRAST TECHNIQUE: Multidetector CT imaging of the chest, abdomen and pelvis was performed following the standard protocol without IV contrast. RADIATION DOSE REDUCTION: This exam was performed according to the departmental dose-optimization program which includes automated exposure control, adjustment of the mA and/or kV according to patient size and/or use of iterative reconstruction technique. COMPARISON:  August 08, 2022. FINDINGS: CT CHEST FINDINGS Cardiovascular: Atherosclerosis of thoracic aorta without aneurysm formation. Normal cardiac size. No pericardial effusion. Coronary artery calcifications are noted consistent with coronary artery disease. Mediastinum/Nodes: No enlarged mediastinal, hilar, or axillary lymph nodes. Thyroid gland, trachea, and esophagus demonstrate no significant findings. Lungs/Pleura: Minimal left pleural effusion is noted. No pneumothorax is noted. No acute consolidative process is noted. Musculoskeletal: No chest wall mass or suspicious bone lesions identified. CT ABDOMEN PELVIS FINDINGS Hepatobiliary: No focal liver abnormality is seen. No gallstones, gallbladder wall thickening, or biliary dilatation. Pancreas: Unremarkable. No pancreatic ductal dilatation or surrounding inflammatory changes. Spleen: Normal in size without focal abnormality. Adrenals/Urinary Tract:  Adrenal glands appear normal. Bilateral renal cysts are noted for which no further follow-up is required. No hydronephrosis or renal obstruction is noted. Urinary bladder is decompressed secondary to suprapubic catheter. Stomach/Bowel: Stomach is unremarkable. There is no evidence of bowel obstruction or inflammation. The appendix is unremarkable. Diverticulosis of descending and sigmoid colon is noted without inflammation. Vascular/Lymphatic: Aortic atherosclerosis. No enlarged abdominal or pelvic lymph nodes. Reproductive: Moderate prostatic enlargement is noted. Other: No abdominal wall hernia or abnormality. No abdominopelvic ascites. Musculoskeletal: Mild grade 1 anterolisthesis of L5-S1 is noted secondary to bilateral L5 spondylolysis. No acute osseous abnormality is noted. IMPRESSION: Minimal left pleural effusion is noted. No other traumatic injury seen in the chest, abdomen or pelvis. Diverticulosis of descending and sigmoid colon is noted without inflammation. Moderate prostatic enlargement. Extensive coronary artery calcifications are noted suggesting coronary artery disease. Aortic Atherosclerosis (ICD10-I70.0). Electronically Signed   By: Lupita Raider M.D.   On: 04/02/2023 15:01   CT HEAD WO CONTRAST Result Date: 04/02/2023 CLINICAL DATA:  Provided history: Facial trauma, blunt. Additional history provided: Fall (hitting ground and branches). On blood thinner. Right forehead swelling. Right eye swelling. Upper lip laceration. EXAM: CT HEAD WITHOUT CONTRAST CT MAXILLOFACIAL WITHOUT CONTRAST CT CERVICAL SPINE WITHOUT CONTRAST TECHNIQUE: Multidetector CT imaging of the head, cervical spine, and maxillofacial structures were performed using the standard protocol without intravenous contrast. Multiplanar CT image reconstructions of the cervical spine and maxillofacial structures were also generated. RADIATION DOSE REDUCTION: This exam was performed according to the departmental dose-optimization  program which includes automated exposure control, adjustment of the mA and/or kV according to patient size and/or use of iterative reconstruction technique. COMPARISON:  Head CT 08/09/2022. FINDINGS: CT HEAD FINDINGS Brain: Generalized cerebral atrophy.  Acute subdural hematoma along the left aspect of the falx measuring up to 3 mm in thickness (for instance as seen on series 3, image 25). No demarcated cortical infarct. No evidence of an intracranial mass. No midline shift. Vascular: No hyperdense vessel.  Atherosclerotic calcifications. Skull: No calvarial fracture or aggressive osseous lesion. Other: Large right anterior scalp, forehead and periorbital hematoma. Forehead laceration. CT MAXILLOFACIAL FINDINGS Osseous: No acute maxillofacial fracture is identified. Orbits: Large right periorbital hematoma. No acute finding within the orbits. Sinuses: Moderate polypoid mucosal thickening within the right maxillary sinus. Moderate mucosal thickening within the left maxillary sinus. Minimal mucosal thickening within bilateral ethmoid air cells. Soft tissues: Large right anterior scalp, forehead and periorbital hematoma. Forehead laceration. CT CERVICAL SPINE FINDINGS Alignment: Slight C4-C5 grade 1 anterolisthesis. Slight C5-C6 grade 1 retrolisthesis. Slight T2-T3 grade 1 anterolisthesis. Skull base and vertebrae: The basion-dental and atlanto-dental intervals are maintained. Acute displaced fracture of the left C1 transverse process (lateral to the foramen transversarium) (for instance as seen on series 9, image 66) (series 4, image 30). Mildly displaced acute fracture through the left C5 foramen transversarium (series 10, images 50-51). Acute, displaced fracture of the C5 spinous process (series 10, image 57). No other acute cervical spine fracture is identified. Soft tissues and spinal canal: No prevertebral fluid or swelling. No visible canal hematoma. Disc levels: Cervical spondylosis with multilevel disc space  narrowing, disc bulges/central disc protrusions, uncovertebral hypertrophy and facet arthropathy. Disc space narrowing is greatest at C4-C5 and C5-C6 (moderate to advanced at these levels). Multilevel spinal canal narrowing. Most notably at C5-C6, a central disc protrusion contributes to at least moderate spinal canal stenosis. Multilevel bony neural foraminal narrowing. Upper chest: No consolidation within the imaged lung apices. No visible pneumothorax. Acute findings called by telephone at the time of interpretation on 04/02/2023 at 1:49 pm to provider JULIE HAVILAND , who verbally acknowledged these results. IMPRESSION: CT head: 1. Acute subdural hematoma along the left aspect of the falx, measuring up to 3 mm in thickness. 2. Large right anterior scalp, forehead and periorbital hematoma. 3. Forehead laceration. 4. Cerebral atrophy. CT maxillofacial: 1. No evidence of an acute maxillofacial fracture. 2. Large right anterior scalp, forehead and periorbital hematoma. 3. Forehead laceration. 4. Paranasal sinus disease as described. CT cervical spine: 1. Acute displaced fracture of the left C1 transverse process (lateral to the foramen transversarium). 2. Acute mildly displaced fracture through the left C5 foramen transversarium. A CTA of the neck is recommended to exclude traumatic injury to the left vertebral artery. 3. Acute, displaced fracture of the C5 spinous process. 4. Mild grade 1 spondylolisthesis at C4-C5, C5-C6 and T2-T3. 5. Cervical spondylosis as described within the body of the report. Spinal canal stenosis is greatest at C5-C6 (at least moderate at this level). Multilevel bony neural foraminal narrowing. Electronically Signed   By: Jackey Loge D.O.   On: 04/02/2023 13:51   CT MAXILLOFACIAL WO CONTRAST Result Date: 04/02/2023 CLINICAL DATA:  Provided history: Facial trauma, blunt. Additional history provided: Fall (hitting ground and branches). On blood thinner. Right forehead swelling. Right eye  swelling. Upper lip laceration. EXAM: CT HEAD WITHOUT CONTRAST CT MAXILLOFACIAL WITHOUT CONTRAST CT CERVICAL SPINE WITHOUT CONTRAST TECHNIQUE: Multidetector CT imaging of the head, cervical spine, and maxillofacial structures were performed using the standard protocol without intravenous contrast. Multiplanar CT image reconstructions of the cervical spine and maxillofacial structures were also generated. RADIATION DOSE REDUCTION: This exam was performed according to the departmental dose-optimization program which includes automated  exposure control, adjustment of the mA and/or kV according to patient size and/or use of iterative reconstruction technique. COMPARISON:  Head CT 08/09/2022. FINDINGS: CT HEAD FINDINGS Brain: Generalized cerebral atrophy. Acute subdural hematoma along the left aspect of the falx measuring up to 3 mm in thickness (for instance as seen on series 3, image 25). No demarcated cortical infarct. No evidence of an intracranial mass. No midline shift. Vascular: No hyperdense vessel.  Atherosclerotic calcifications. Skull: No calvarial fracture or aggressive osseous lesion. Other: Large right anterior scalp, forehead and periorbital hematoma. Forehead laceration. CT MAXILLOFACIAL FINDINGS Osseous: No acute maxillofacial fracture is identified. Orbits: Large right periorbital hematoma. No acute finding within the orbits. Sinuses: Moderate polypoid mucosal thickening within the right maxillary sinus. Moderate mucosal thickening within the left maxillary sinus. Minimal mucosal thickening within bilateral ethmoid air cells. Soft tissues: Large right anterior scalp, forehead and periorbital hematoma. Forehead laceration. CT CERVICAL SPINE FINDINGS Alignment: Slight C4-C5 grade 1 anterolisthesis. Slight C5-C6 grade 1 retrolisthesis. Slight T2-T3 grade 1 anterolisthesis. Skull base and vertebrae: The basion-dental and atlanto-dental intervals are maintained. Acute displaced fracture of the left C1  transverse process (lateral to the foramen transversarium) (for instance as seen on series 9, image 66) (series 4, image 30). Mildly displaced acute fracture through the left C5 foramen transversarium (series 10, images 50-51). Acute, displaced fracture of the C5 spinous process (series 10, image 57). No other acute cervical spine fracture is identified. Soft tissues and spinal canal: No prevertebral fluid or swelling. No visible canal hematoma. Disc levels: Cervical spondylosis with multilevel disc space narrowing, disc bulges/central disc protrusions, uncovertebral hypertrophy and facet arthropathy. Disc space narrowing is greatest at C4-C5 and C5-C6 (moderate to advanced at these levels). Multilevel spinal canal narrowing. Most notably at C5-C6, a central disc protrusion contributes to at least moderate spinal canal stenosis. Multilevel bony neural foraminal narrowing. Upper chest: No consolidation within the imaged lung apices. No visible pneumothorax. Acute findings called by telephone at the time of interpretation on 04/02/2023 at 1:49 pm to provider JULIE HAVILAND , who verbally acknowledged these results. IMPRESSION: CT head: 1. Acute subdural hematoma along the left aspect of the falx, measuring up to 3 mm in thickness. 2. Large right anterior scalp, forehead and periorbital hematoma. 3. Forehead laceration. 4. Cerebral atrophy. CT maxillofacial: 1. No evidence of an acute maxillofacial fracture. 2. Large right anterior scalp, forehead and periorbital hematoma. 3. Forehead laceration. 4. Paranasal sinus disease as described. CT cervical spine: 1. Acute displaced fracture of the left C1 transverse process (lateral to the foramen transversarium). 2. Acute mildly displaced fracture through the left C5 foramen transversarium. A CTA of the neck is recommended to exclude traumatic injury to the left vertebral artery. 3. Acute, displaced fracture of the C5 spinous process. 4. Mild grade 1 spondylolisthesis at  C4-C5, C5-C6 and T2-T3. 5. Cervical spondylosis as described within the body of the report. Spinal canal stenosis is greatest at C5-C6 (at least moderate at this level). Multilevel bony neural foraminal narrowing. Electronically Signed   By: Jackey Loge D.O.   On: 04/02/2023 13:51   CT CERVICAL SPINE WO CONTRAST Result Date: 04/02/2023 CLINICAL DATA:  Provided history: Facial trauma, blunt. Additional history provided: Fall (hitting ground and branches). On blood thinner. Right forehead swelling. Right eye swelling. Upper lip laceration. EXAM: CT HEAD WITHOUT CONTRAST CT MAXILLOFACIAL WITHOUT CONTRAST CT CERVICAL SPINE WITHOUT CONTRAST TECHNIQUE: Multidetector CT imaging of the head, cervical spine, and maxillofacial structures were performed using the standard protocol without  intravenous contrast. Multiplanar CT image reconstructions of the cervical spine and maxillofacial structures were also generated. RADIATION DOSE REDUCTION: This exam was performed according to the departmental dose-optimization program which includes automated exposure control, adjustment of the mA and/or kV according to patient size and/or use of iterative reconstruction technique. COMPARISON:  Head CT 08/09/2022. FINDINGS: CT HEAD FINDINGS Brain: Generalized cerebral atrophy. Acute subdural hematoma along the left aspect of the falx measuring up to 3 mm in thickness (for instance as seen on series 3, image 25). No demarcated cortical infarct. No evidence of an intracranial mass. No midline shift. Vascular: No hyperdense vessel.  Atherosclerotic calcifications. Skull: No calvarial fracture or aggressive osseous lesion. Other: Large right anterior scalp, forehead and periorbital hematoma. Forehead laceration. CT MAXILLOFACIAL FINDINGS Osseous: No acute maxillofacial fracture is identified. Orbits: Large right periorbital hematoma. No acute finding within the orbits. Sinuses: Moderate polypoid mucosal thickening within the right maxillary  sinus. Moderate mucosal thickening within the left maxillary sinus. Minimal mucosal thickening within bilateral ethmoid air cells. Soft tissues: Large right anterior scalp, forehead and periorbital hematoma. Forehead laceration. CT CERVICAL SPINE FINDINGS Alignment: Slight C4-C5 grade 1 anterolisthesis. Slight C5-C6 grade 1 retrolisthesis. Slight T2-T3 grade 1 anterolisthesis. Skull base and vertebrae: The basion-dental and atlanto-dental intervals are maintained. Acute displaced fracture of the left C1 transverse process (lateral to the foramen transversarium) (for instance as seen on series 9, image 66) (series 4, image 30). Mildly displaced acute fracture through the left C5 foramen transversarium (series 10, images 50-51). Acute, displaced fracture of the C5 spinous process (series 10, image 57). No other acute cervical spine fracture is identified. Soft tissues and spinal canal: No prevertebral fluid or swelling. No visible canal hematoma. Disc levels: Cervical spondylosis with multilevel disc space narrowing, disc bulges/central disc protrusions, uncovertebral hypertrophy and facet arthropathy. Disc space narrowing is greatest at C4-C5 and C5-C6 (moderate to advanced at these levels). Multilevel spinal canal narrowing. Most notably at C5-C6, a central disc protrusion contributes to at least moderate spinal canal stenosis. Multilevel bony neural foraminal narrowing. Upper chest: No consolidation within the imaged lung apices. No visible pneumothorax. Acute findings called by telephone at the time of interpretation on 04/02/2023 at 1:49 pm to provider JULIE HAVILAND , who verbally acknowledged these results. IMPRESSION: CT head: 1. Acute subdural hematoma along the left aspect of the falx, measuring up to 3 mm in thickness. 2. Large right anterior scalp, forehead and periorbital hematoma. 3. Forehead laceration. 4. Cerebral atrophy. CT maxillofacial: 1. No evidence of an acute maxillofacial fracture. 2. Large  right anterior scalp, forehead and periorbital hematoma. 3. Forehead laceration. 4. Paranasal sinus disease as described. CT cervical spine: 1. Acute displaced fracture of the left C1 transverse process (lateral to the foramen transversarium). 2. Acute mildly displaced fracture through the left C5 foramen transversarium. A CTA of the neck is recommended to exclude traumatic injury to the left vertebral artery. 3. Acute, displaced fracture of the C5 spinous process. 4. Mild grade 1 spondylolisthesis at C4-C5, C5-C6 and T2-T3. 5. Cervical spondylosis as described within the body of the report. Spinal canal stenosis is greatest at C5-C6 (at least moderate at this level). Multilevel bony neural foraminal narrowing. Electronically Signed   By: Jackey Loge D.O.   On: 04/02/2023 13:51   DG Pelvis Portable Result Date: 04/02/2023 CLINICAL DATA:  Trauma.  Fall. EXAM: PORTABLE PELVIS 1-2 VIEWS COMPARISON:  None Available. FINDINGS: Pelvis is intact with normal and symmetric sacroiliac joints. No acute fracture or dislocation. No  aggressive osseous lesion. Visualized sacral arcuate lines are unremarkable. Unremarkable symphysis pubis. There are mild degenerative changes of bilateral hip joints without significant joint space narrowing. Osteophytosis of the superior acetabulum. No radiopaque foreign bodies. IMPRESSION: No acute osseous abnormality of the pelvis. Electronically Signed   By: Jules Schick M.D.   On: 04/02/2023 13:23   DG Chest Port 1 View Result Date: 04/02/2023 CLINICAL DATA:  Trauma. EXAM: PORTABLE CHEST 1 VIEW COMPARISON:  12/09/2022 FINDINGS: Low lung volume. Bilateral lung fields are clear. Bilateral costophrenic angles are clear. Normal cardio-mediastinal silhouette. No acute osseous abnormalities. No acute displaced rib fracture seen. The soft tissues are within normal limits. IMPRESSION: No active disease. Electronically Signed   By: Jules Schick M.D.   On: 04/02/2023 13:22    ROS Blood  pressure (!) 122/52, pulse 78, temperature 97.6 F (36.4 C), resp. rate (!) 28, height 5\' 10"  (1.778 m), weight 89.4 kg, SpO2 96%. Physical Exam HENT:     Right Ear: External ear normal.     Left Ear: External ear normal.     Nose:     Comments: The right anterior inferior nose with obvious bleeding site. Pledgets placed with lidocaine and afrin and cauterized the area multiple times for hemastasis. A small piece of surgicel placed. No further bleeding down back of the throat.  No hematoma    Mouth/Throat:     Comments: No injury to the OC/OP. The blood clot is coming from the NP on the right. Minimal currently Eyes:     Pupils: Pupils are equal, round, and reactive to light.     Comments: Right eye with ecchymosis and swelling  Neurological:     Mental Status: He is alert.       Assessment/Plan: Epistaxis- the area most likely bleeding was cauterized. Good hemastasis and no further bleeding  back of the throat.  Will see if any further bleeding and call if so.   Suzanna Obey 04/02/2023, 7:18 PM

## 2023-04-02 NOTE — ED Notes (Signed)
 ENT provider at bedside. Repeat CBC also obtained and sent to lab at this time

## 2023-04-02 NOTE — Progress Notes (Signed)
 Trauma Event Note    Resident notified of continuation of bleeding, Dr. Derrell Lolling also notified, Will hold Plavix until ENT sees pt, also repeat CBC now  Last imported Vital Signs BP (!) 122/52   Pulse 78   Temp 97.6 F (36.4 C)   Resp (!) 28   Ht 5\' 10"  (1.778 m)   Wt 197 lb (89.4 kg)   SpO2 96%   BMI 28.27 kg/m   Trending CBC Recent Labs    04/02/23 1120 04/02/23 1130  WBC 7.2  --   HGB 10.9* 9.5*  HCT 32.3* 28.0*  PLT 178  --     Trending Coag's Recent Labs    04/02/23 1120  INR 1.2    Trending BMET Recent Labs    04/02/23 1120 04/02/23 1130  NA 139 137  K 4.5 4.5  CL 108 108  CO2 22  --   BUN 28* 29*  CREATININE 1.85* 2.00*  GLUCOSE 130* 120*      Shawn Andrews [name]  Trauma Response RN  Please call TRN at [phone] for further assistance.

## 2023-04-02 NOTE — ED Notes (Signed)
 Pt appears to be more pale than earlier. Pt Aox4 and warm to touch. Notified Admitting team .

## 2023-04-02 NOTE — Progress Notes (Signed)
 Saw patient in the ED, wife at bedside. Patient was alert and oriented, hemodynamically stable. Had large amount of blood in suction canister. Indicated he was feeling okay. He consented to blood transfusion.   Monna Fam, MD

## 2023-04-02 NOTE — ED Provider Notes (Signed)
 [place] EMERGENCY DEPARTMENT AT [facility] Provider Note   CSN: [id number] Arrival date & time: 04/02/23  1112     History  Chief Complaint  Patient presents with   Shawn Andrews is a 89 y.o. male.  Pt is a 89 yo male with pmhx significant for BPH (suprapubic cath in place), HTN, HLD, CHF, and CAD.  Pt said he was outside digging up a shrub and lost his balance and fell down an embankment.  Pt said it was about 10 feet down.  He did not have a LOC.  He was able to walk to his house and call EMS.  Pt has many facial and scalp lacerations.  He is on Plavix.  He denies any pain in arms or legs.       Home Medications Prior to Admission medications   Medication Sig Start Date End Date Taking? Authorizing Provider  acetaminophen (TYLENOL 8 HOUR) 650 MG CR tablet Take 1 tablet (650 mg total) by mouth 2 (two) times a day. 06/24/18   Eustaquio Boyden, MD  aspirin 81 MG chewable tablet Chew 1 tablet by mouth daily.    [provider]  Cholecalciferol (VITAMIN D3) 25 MCG (1000 UT) CAPS Take 1 capsule (1,000 Units total) by mouth daily. 08/25/18   Eustaquio Boyden, MD  clindamycin (CLEOCIN) 150 MG capsule SMARTSIG:4 Capsule(s) By Mouth 02/27/23   [provider]  clopidogrel (PLAVIX) 75 MG tablet Take 75 mg by mouth daily. 10/10/20   [provider]  cyanocobalamin 1000 MCG tablet Take 1,000 mcg by mouth daily.    [provider]  docusate sodium (COLACE) 100 MG capsule Take 1 capsule (100 mg total) by mouth daily. 12/13/20   Eustaquio Boyden, MD  doxazosin (CARDURA) 4 MG tablet Take 4 mg by mouth daily. 02/13/23   [provider]  famotidine (PEPCID) 20 MG tablet TAKE 1 TABLET BY MOUTH AT  BEDTIME 01/23/21   Eustaquio Boyden, MD  finasteride (PROSCAR) 5 MG tablet TAKE 1 TABLET BY MOUTH  DAILY 11/17/19   Eustaquio Boyden, MD  furosemide (LASIX) 20 MG tablet Take 1 tablet by mouth every other day. 10/09/20   [provider]   hydrALAZINE (APRESOLINE) 50 MG tablet Take 1 tablet (50 mg total) by mouth every 8 (eight) hours as needed (systolic (top number) blood pressure higher than 160 OR diastolic (bottom number) blood pressure higher than 100). 12/13/22   Sunnie Nielsen, DO  isosorbide mononitrate (IMDUR) 60 MG 24 hr tablet Take 1 tablet (60 mg total) by mouth 2 (two) times daily. 03/31/22   Enedina Finner, MD  loratadine (CLARITIN) 10 MG tablet Take 1 tablet (10 mg total) by mouth daily as needed for allergies. 12/22/12   Eustaquio Boyden, MD  Melatonin 10 MG TABS Take 1 tablet by mouth at bedtime as needed.    [provider]  metoprolol succinate (TOPROL-XL) 25 MG 24 hr tablet Take 1 tablet (25 mg total) by mouth 2 (two) times daily. 12/13/22   Sunnie Nielsen, DO  nitroGLYCERIN (NITROSTAT) 0.4 MG SL tablet Place 1 tablet (0.4 mg total) under the tongue every 5 (five) minutes x 3 doses as needed for chest pain. 03/31/22   Enedina Finner, MD  pantoprazole (PROTONIX) 40 MG tablet Take 40 mg by mouth 2 (two) times daily. 02/14/22 02/14/23  [provider]  primidone (MYSOLINE) 50 MG tablet Take 2 tablets (100 mg total) by mouth 3 (three) times daily. 10/16/20   Sharen Hones,  Wynona Canes, MD  rosuvastatin (CRESTOR) 20 MG tablet Take 20 mg by mouth at bedtime.    [provider]  sucralfate (CARAFATE) 1 GM/10ML suspension SMARTSIG:Milliliter(s) By Mouth    [provider]      Allergies    Penicillins, Cialis [tadalafil], Lipitor [atorvastatin], and Lisinopril    Review of Systems   Review of Systems  HENT:  Positive for facial swelling and nosebleeds.        Lip lac  Skin:  Positive for wound.       Skin tear to right forearm and to left lower leg  All other systems reviewed and are negative.   Physical Exam Updated Vital Signs BP (!) 109/53   Pulse 78   Temp 97.6 F (36.4 C)   Resp (!) 28   Ht 5\' 10"  (1.778 m)   Wt 89.4 kg   SpO2 96%   BMI 28.27 kg/m  Physical Exam Vitals and  nursing note reviewed.  Constitutional:      Appearance: Normal appearance.  HENT:     Head:     Comments: Lac to forehead Lac to bridge of nose Lac to lips Multiple facial abrasions Right eye lid swollen, but good ROM to eye with no pain on EOM    Right Ear: External ear normal.     Left Ear: External ear normal.     Mouth/Throat:     Mouth: Mucous membranes are moist.  Eyes:     Extraocular Movements: Extraocular movements intact.     Conjunctiva/sclera: Conjunctivae normal.     Pupils: Pupils are equal, round, and reactive to light.  Neck:     Comments: In c-collar Cardiovascular:     Rate and Rhythm: Normal rate and regular rhythm.     Pulses: Normal pulses.     Heart sounds: Normal heart sounds.  Pulmonary:     Effort: Pulmonary effort is normal.     Breath sounds: Normal breath sounds.  Abdominal:     General: Abdomen is flat. Bowel sounds are normal.     Palpations: Abdomen is soft.     Comments: Suprapubic cath in place  Musculoskeletal:        General: Normal range of motion.  Skin:    General: Skin is warm.     Capillary Refill: Capillary refill takes less than 2 seconds.  Neurological:     General: No focal deficit present.     Mental Status: He is alert and oriented to person, place, and time.  Psychiatric:        Mood and Affect: Mood normal.        Behavior: Behavior normal.     ED Results / Procedures / Treatments   Labs (all labs ordered are listed, but only abnormal results are displayed) Labs Reviewed  COMPREHENSIVE METABOLIC PANEL - Abnormal; Notable for the following components:      Result Value   Glucose, Bld 130 (*)    BUN 28 (*)    Creatinine, Ser 1.85 (*)    Calcium 8.3 (*)    Total Protein 5.9 (*)    GFR, Estimated 34 (*)    All other components within normal limits  CBC - Abnormal; Notable for the following components:   RBC 3.64 (*)    Hemoglobin 10.9 (*)    HCT 32.3 (*)    All other components within normal limits   URINALYSIS, ROUTINE W REFLEX MICROSCOPIC - Abnormal; Notable for the following components:   APPearance TURBID (*)  Protein, ur 100 (*)    Leukocytes,Ua LARGE (*)    Bacteria, UA RARE (*)    All other components within normal limits  PROTIME-INR - Abnormal; Notable for the following components:   Prothrombin Time 15.4 (*)    All other components within normal limits  I-STAT CHEM 8, ED - Abnormal; Notable for the following components:   BUN 29 (*)    Creatinine, Ser 2.00 (*)    Glucose, Bld 120 (*)    Calcium, Ion 1.02 (*)    TCO2 21 (*)    Hemoglobin 9.5 (*)    HCT 28.0 (*)    All other components within normal limits  I-STAT CG4 LACTIC ACID, ED - Abnormal; Notable for the following components:   Lactic Acid, Venous 2.0 (*)    All other components within normal limits  ETHANOL  SAMPLE TO BLOOD BANK    EKG None  Radiology CT CHEST ABDOMEN PELVIS WO CONTRAST Result Date: 04/02/2023 CLINICAL DATA:  Fall. EXAM: CT CHEST, ABDOMEN AND PELVIS WITHOUT CONTRAST TECHNIQUE: Multidetector CT imaging of the chest, abdomen and pelvis was performed following the standard protocol without IV contrast. RADIATION DOSE REDUCTION: This exam was performed according to the departmental dose-optimization program which includes automated exposure control, adjustment of the mA and/or kV according to patient size and/or use of iterative reconstruction technique. COMPARISON:  August 08, 2022. FINDINGS: CT CHEST FINDINGS Cardiovascular: Atherosclerosis of thoracic aorta without aneurysm formation. Normal cardiac size. No pericardial effusion. Coronary artery calcifications are noted consistent with coronary artery disease. Mediastinum/Nodes: No enlarged mediastinal, hilar, or axillary lymph nodes. Thyroid gland, trachea, and esophagus demonstrate no significant findings. Lungs/Pleura: Minimal left pleural effusion is noted. No pneumothorax is noted. No acute consolidative process is noted. Musculoskeletal: No  chest wall mass or suspicious bone lesions identified. CT ABDOMEN PELVIS FINDINGS Hepatobiliary: No focal liver abnormality is seen. No gallstones, gallbladder wall thickening, or biliary dilatation. Pancreas: Unremarkable. No pancreatic ductal dilatation or surrounding inflammatory changes. Spleen: Normal in size without focal abnormality. Adrenals/Urinary Tract: Adrenal glands appear normal. Bilateral renal cysts are noted for which no further follow-up is required. No hydronephrosis or renal obstruction is noted. Urinary bladder is decompressed secondary to suprapubic catheter. Stomach/Bowel: Stomach is unremarkable. There is no evidence of bowel obstruction or inflammation. The appendix is unremarkable. Diverticulosis of descending and sigmoid colon is noted without inflammation. Vascular/Lymphatic: Aortic atherosclerosis. No enlarged abdominal or pelvic lymph nodes. Reproductive: Moderate prostatic enlargement is noted. Other: No abdominal wall hernia or abnormality. No abdominopelvic ascites. Musculoskeletal: Mild grade 1 anterolisthesis of L5-S1 is noted secondary to bilateral L5 spondylolysis. No acute osseous abnormality is noted. IMPRESSION: Minimal left pleural effusion is noted. No other traumatic injury seen in the chest, abdomen or pelvis. Diverticulosis of descending and sigmoid colon is noted without inflammation. Moderate prostatic enlargement. Extensive coronary artery calcifications are noted suggesting coronary artery disease. Aortic Atherosclerosis (ICD10-I70.0). Electronically Signed   By: Lupita Raider M.D.   On: 04/02/2023 15:01   CT HEAD WO CONTRAST Result Date: 04/02/2023 CLINICAL DATA:  Provided history: Facial trauma, blunt. Additional history provided: Fall (hitting ground and branches). On blood thinner. Right forehead swelling. Right eye swelling. Upper lip laceration. EXAM: CT HEAD WITHOUT CONTRAST CT MAXILLOFACIAL WITHOUT CONTRAST CT CERVICAL SPINE WITHOUT CONTRAST TECHNIQUE:  Multidetector CT imaging of the head, cervical spine, and maxillofacial structures were performed using the standard protocol without intravenous contrast. Multiplanar CT image reconstructions of the cervical spine and maxillofacial structures were also generated. RADIATION  DOSE REDUCTION: This exam was performed according to the departmental dose-optimization program which includes automated exposure control, adjustment of the mA and/or kV according to patient size and/or use of iterative reconstruction technique. COMPARISON:  Head CT 08/09/2022. FINDINGS: CT HEAD FINDINGS Brain: Generalized cerebral atrophy. Acute subdural hematoma along the left aspect of the falx measuring up to 3 mm in thickness (for instance as seen on series 3, image 25). No demarcated cortical infarct. No evidence of an intracranial mass. No midline shift. Vascular: No hyperdense vessel.  Atherosclerotic calcifications. Skull: No calvarial fracture or aggressive osseous lesion. Other: Large right anterior scalp, forehead and periorbital hematoma. Forehead laceration. CT MAXILLOFACIAL FINDINGS Osseous: No acute maxillofacial fracture is identified. Orbits: Large right periorbital hematoma. No acute finding within the orbits. Sinuses: Moderate polypoid mucosal thickening within the right maxillary sinus. Moderate mucosal thickening within the left maxillary sinus. Minimal mucosal thickening within bilateral ethmoid air cells. Soft tissues: Large right anterior scalp, forehead and periorbital hematoma. Forehead laceration. CT CERVICAL SPINE FINDINGS Alignment: Slight C4-C5 grade 1 anterolisthesis. Slight C5-C6 grade 1 retrolisthesis. Slight T2-T3 grade 1 anterolisthesis. Skull base and vertebrae: The basion-dental and atlanto-dental intervals are maintained. Acute displaced fracture of the left C1 transverse process (lateral to the foramen transversarium) (for instance as seen on series 9, image 66) (series 4, image 30). Mildly displaced acute  fracture through the left C5 foramen transversarium (series 10, images 50-51). Acute, displaced fracture of the C5 spinous process (series 10, image 57). No other acute cervical spine fracture is identified. Soft tissues and spinal canal: No prevertebral fluid or swelling. No visible canal hematoma. Disc levels: Cervical spondylosis with multilevel disc space narrowing, disc bulges/central disc protrusions, uncovertebral hypertrophy and facet arthropathy. Disc space narrowing is greatest at C4-C5 and C5-C6 (moderate to advanced at these levels). Multilevel spinal canal narrowing. Most notably at C5-C6, a central disc protrusion contributes to at least moderate spinal canal stenosis. Multilevel bony neural foraminal narrowing. Upper chest: No consolidation within the imaged lung apices. No visible pneumothorax. Acute findings called by telephone at the time of interpretation on 04/02/2023 at 1:49 pm to provider September Mormile , who verbally acknowledged these results. IMPRESSION: CT head: 1. Acute subdural hematoma along the left aspect of the falx, measuring up to 3 mm in thickness. 2. Large right anterior scalp, forehead and periorbital hematoma. 3. Forehead laceration. 4. Cerebral atrophy. CT maxillofacial: 1. No evidence of an acute maxillofacial fracture. 2. Large right anterior scalp, forehead and periorbital hematoma. 3. Forehead laceration. 4. Paranasal sinus disease as described. CT cervical spine: 1. Acute displaced fracture of the left C1 transverse process (lateral to the foramen transversarium). 2. Acute mildly displaced fracture through the left C5 foramen transversarium. A CTA of the neck is recommended to exclude traumatic injury to the left vertebral artery. 3. Acute, displaced fracture of the C5 spinous process. 4. Mild grade 1 spondylolisthesis at C4-C5, C5-C6 and T2-T3. 5. Cervical spondylosis as described within the body of the report. Spinal canal stenosis is greatest at C5-C6 (at least moderate  at this level). Multilevel bony neural foraminal narrowing. Electronically Signed   By: Jackey Loge D.O.   On: 04/02/2023 13:51   CT MAXILLOFACIAL WO CONTRAST Result Date: 04/02/2023 CLINICAL DATA:  Provided history: Facial trauma, blunt. Additional history provided: Fall (hitting ground and branches). On blood thinner. Right forehead swelling. Right eye swelling. Upper lip laceration. EXAM: CT HEAD WITHOUT CONTRAST CT MAXILLOFACIAL WITHOUT CONTRAST CT CERVICAL SPINE WITHOUT CONTRAST TECHNIQUE: Multidetector CT imaging of  the head, cervical spine, and maxillofacial structures were performed using the standard protocol without intravenous contrast. Multiplanar CT image reconstructions of the cervical spine and maxillofacial structures were also generated. RADIATION DOSE REDUCTION: This exam was performed according to the departmental dose-optimization program which includes automated exposure control, adjustment of the mA and/or kV according to patient size and/or use of iterative reconstruction technique. COMPARISON:  Head CT 08/09/2022. FINDINGS: CT HEAD FINDINGS Brain: Generalized cerebral atrophy. Acute subdural hematoma along the left aspect of the falx measuring up to 3 mm in thickness (for instance as seen on series 3, image 25). No demarcated cortical infarct. No evidence of an intracranial mass. No midline shift. Vascular: No hyperdense vessel.  Atherosclerotic calcifications. Skull: No calvarial fracture or aggressive osseous lesion. Other: Large right anterior scalp, forehead and periorbital hematoma. Forehead laceration. CT MAXILLOFACIAL FINDINGS Osseous: No acute maxillofacial fracture is identified. Orbits: Large right periorbital hematoma. No acute finding within the orbits. Sinuses: Moderate polypoid mucosal thickening within the right maxillary sinus. Moderate mucosal thickening within the left maxillary sinus. Minimal mucosal thickening within bilateral ethmoid air cells. Soft tissues: Large  right anterior scalp, forehead and periorbital hematoma. Forehead laceration. CT CERVICAL SPINE FINDINGS Alignment: Slight C4-C5 grade 1 anterolisthesis. Slight C5-C6 grade 1 retrolisthesis. Slight T2-T3 grade 1 anterolisthesis. Skull base and vertebrae: The basion-dental and atlanto-dental intervals are maintained. Acute displaced fracture of the left C1 transverse process (lateral to the foramen transversarium) (for instance as seen on series 9, image 66) (series 4, image 30). Mildly displaced acute fracture through the left C5 foramen transversarium (series 10, images 50-51). Acute, displaced fracture of the C5 spinous process (series 10, image 57). No other acute cervical spine fracture is identified. Soft tissues and spinal canal: No prevertebral fluid or swelling. No visible canal hematoma. Disc levels: Cervical spondylosis with multilevel disc space narrowing, disc bulges/central disc protrusions, uncovertebral hypertrophy and facet arthropathy. Disc space narrowing is greatest at C4-C5 and C5-C6 (moderate to advanced at these levels). Multilevel spinal canal narrowing. Most notably at C5-C6, a central disc protrusion contributes to at least moderate spinal canal stenosis. Multilevel bony neural foraminal narrowing. Upper chest: No consolidation within the imaged lung apices. No visible pneumothorax. Acute findings called by telephone at the time of interpretation on 04/02/2023 at 1:49 pm to provider Martia Dalby , who verbally acknowledged these results. IMPRESSION: CT head: 1. Acute subdural hematoma along the left aspect of the falx, measuring up to 3 mm in thickness. 2. Large right anterior scalp, forehead and periorbital hematoma. 3. Forehead laceration. 4. Cerebral atrophy. CT maxillofacial: 1. No evidence of an acute maxillofacial fracture. 2. Large right anterior scalp, forehead and periorbital hematoma. 3. Forehead laceration. 4. Paranasal sinus disease as described. CT cervical spine: 1. Acute  displaced fracture of the left C1 transverse process (lateral to the foramen transversarium). 2. Acute mildly displaced fracture through the left C5 foramen transversarium. A CTA of the neck is recommended to exclude traumatic injury to the left vertebral artery. 3. Acute, displaced fracture of the C5 spinous process. 4. Mild grade 1 spondylolisthesis at C4-C5, C5-C6 and T2-T3. 5. Cervical spondylosis as described within the body of the report. Spinal canal stenosis is greatest at C5-C6 (at least moderate at this level). Multilevel bony neural foraminal narrowing. Electronically Signed   By: Jackey Loge D.O.   On: 04/02/2023 13:51   CT CERVICAL SPINE WO CONTRAST Result Date: 04/02/2023 CLINICAL DATA:  Provided history: Facial trauma, blunt. Additional history provided: Fall (hitting ground and  branches). On blood thinner. Right forehead swelling. Right eye swelling. Upper lip laceration. EXAM: CT HEAD WITHOUT CONTRAST CT MAXILLOFACIAL WITHOUT CONTRAST CT CERVICAL SPINE WITHOUT CONTRAST TECHNIQUE: Multidetector CT imaging of the head, cervical spine, and maxillofacial structures were performed using the standard protocol without intravenous contrast. Multiplanar CT image reconstructions of the cervical spine and maxillofacial structures were also generated. RADIATION DOSE REDUCTION: This exam was performed according to the departmental dose-optimization program which includes automated exposure control, adjustment of the mA and/or kV according to patient size and/or use of iterative reconstruction technique. COMPARISON:  Head CT 08/09/2022. FINDINGS: CT HEAD FINDINGS Brain: Generalized cerebral atrophy. Acute subdural hematoma along the left aspect of the falx measuring up to 3 mm in thickness (for instance as seen on series 3, image 25). No demarcated cortical infarct. No evidence of an intracranial mass. No midline shift. Vascular: No hyperdense vessel.  Atherosclerotic calcifications. Skull: No calvarial  fracture or aggressive osseous lesion. Other: Large right anterior scalp, forehead and periorbital hematoma. Forehead laceration. CT MAXILLOFACIAL FINDINGS Osseous: No acute maxillofacial fracture is identified. Orbits: Large right periorbital hematoma. No acute finding within the orbits. Sinuses: Moderate polypoid mucosal thickening within the right maxillary sinus. Moderate mucosal thickening within the left maxillary sinus. Minimal mucosal thickening within bilateral ethmoid air cells. Soft tissues: Large right anterior scalp, forehead and periorbital hematoma. Forehead laceration. CT CERVICAL SPINE FINDINGS Alignment: Slight C4-C5 grade 1 anterolisthesis. Slight C5-C6 grade 1 retrolisthesis. Slight T2-T3 grade 1 anterolisthesis. Skull base and vertebrae: The basion-dental and atlanto-dental intervals are maintained. Acute displaced fracture of the left C1 transverse process (lateral to the foramen transversarium) (for instance as seen on series 9, image 66) (series 4, image 30). Mildly displaced acute fracture through the left C5 foramen transversarium (series 10, images 50-51). Acute, displaced fracture of the C5 spinous process (series 10, image 57). No other acute cervical spine fracture is identified. Soft tissues and spinal canal: No prevertebral fluid or swelling. No visible canal hematoma. Disc levels: Cervical spondylosis with multilevel disc space narrowing, disc bulges/central disc protrusions, uncovertebral hypertrophy and facet arthropathy. Disc space narrowing is greatest at C4-C5 and C5-C6 (moderate to advanced at these levels). Multilevel spinal canal narrowing. Most notably at C5-C6, a central disc protrusion contributes to at least moderate spinal canal stenosis. Multilevel bony neural foraminal narrowing. Upper chest: No consolidation within the imaged lung apices. No visible pneumothorax. Acute findings called by telephone at the time of interpretation on 04/02/2023 at 1:49 pm to provider Churchill Grimsley , who verbally acknowledged these results. IMPRESSION: CT head: 1. Acute subdural hematoma along the left aspect of the falx, measuring up to 3 mm in thickness. 2. Large right anterior scalp, forehead and periorbital hematoma. 3. Forehead laceration. 4. Cerebral atrophy. CT maxillofacial: 1. No evidence of an acute maxillofacial fracture. 2. Large right anterior scalp, forehead and periorbital hematoma. 3. Forehead laceration. 4. Paranasal sinus disease as described. CT cervical spine: 1. Acute displaced fracture of the left C1 transverse process (lateral to the foramen transversarium). 2. Acute mildly displaced fracture through the left C5 foramen transversarium. A CTA of the neck is recommended to exclude traumatic injury to the left vertebral artery. 3. Acute, displaced fracture of the C5 spinous process. 4. Mild grade 1 spondylolisthesis at C4-C5, C5-C6 and T2-T3. 5. Cervical spondylosis as described within the body of the report. Spinal canal stenosis is greatest at C5-C6 (at least moderate at this level). Multilevel bony neural foraminal narrowing. Electronically Signed   By: Ronaldo Miyamoto  Renette Butters D.O.   On: 04/02/2023 13:51   DG Pelvis Portable Result Date: 04/02/2023 CLINICAL DATA:  Trauma.  Fall. EXAM: PORTABLE PELVIS 1-2 VIEWS COMPARISON:  None Available. FINDINGS: Pelvis is intact with normal and symmetric sacroiliac joints. No acute fracture or dislocation. No aggressive osseous lesion. Visualized sacral arcuate lines are unremarkable. Unremarkable symphysis pubis. There are mild degenerative changes of bilateral hip joints without significant joint space narrowing. Osteophytosis of the superior acetabulum. No radiopaque foreign bodies. IMPRESSION: No acute osseous abnormality of the pelvis. Electronically Signed   By: Jules Schick M.D.   On: 04/02/2023 13:23   DG Chest Port 1 View Result Date: 04/02/2023 CLINICAL DATA:  Trauma. EXAM: PORTABLE CHEST 1 VIEW COMPARISON:  12/09/2022 FINDINGS: Low  lung volume. Bilateral lung fields are clear. Bilateral costophrenic angles are clear. Normal cardio-mediastinal silhouette. No acute osseous abnormalities. No acute displaced rib fracture seen. The soft tissues are within normal limits. IMPRESSION: No active disease. Electronically Signed   By: Jules Schick M.D.   On: 04/02/2023 13:22    Procedures Procedures    Medications Ordered in ED Medications  Tdap (BOOSTRIX) injection 0.5 mL (0.5 mLs Intramuscular Given 04/02/23 1123)  lidocaine-EPINEPHrine (XYLOCAINE W/EPI) 2 %-1:200000 (PF) injection 10 mL (10 mLs Infiltration Given 04/02/23 1429)  morphine (PF) 4 MG/ML injection 4 mg (4 mg Intravenous Given 04/02/23 1431)  ondansetron (ZOFRAN) injection 4 mg (4 mg Intravenous Given 04/02/23 1430)  sodium chloride 0.9 % bolus 500 mL (500 mLs Intravenous New Bag/Given 04/02/23 1430)  bacitracin 500 UNIT/GM ointment (  Given 04/02/23 1511)    ED Course/ Medical Decision Making/ A&P                                 Medical Decision Making Amount and/or Complexity of Data Reviewed Labs: ordered. Radiology: ordered.  Risk Prescription drug management. Decision regarding hospitalization.   This patient presents to the ED for concern of fall, this involves an extensive number of treatment options, and is a complaint that carries with it a high risk of complications and morbidity.  The differential diagnosis includes multiple trauma   Co morbidities that complicate the patient evaluation  BPH (suprapubic cath in place), HTN, HLD, CHF, and CAD   Additional history obtained:  Additional history obtained from epic chart review External records from outside source obtained and reviewed including EMS report   Lab Tests:  I Ordered, and personally interpreted labs.  The pertinent results include:  cbc with hgb 10.9, cmp with bun 28 and cr 1.85 (stable), inr 1.2, etoh <10   Imaging Studies ordered:  I ordered imaging studies including cxr,  pelvis  I independently visualized and interpreted imaging which showed  CXR: No active disease.  Pelvis: No acute osseous abnormality of the pelvis.   CT head:    1. Acute subdural hematoma along the left aspect of the falx,  measuring up to 3 mm in thickness.  2. Large right anterior scalp, forehead and periorbital hematoma.  3. Forehead laceration.  4. Cerebral atrophy.    CT maxillofacial:    1. No evidence of an acute maxillofacial fracture.  2. Large right anterior scalp, forehead and periorbital hematoma.  3. Forehead laceration.  4. Paranasal sinus disease as described.    CT cervical spine:    1. Acute displaced fracture of the left C1 transverse process  (lateral to the foramen transversarium).  2. Acute mildly displaced fracture  through the left C5 foramen  transversarium. A CTA of the neck is recommended to exclude  traumatic injury to the left vertebral artery.  3. Acute, displaced fracture of the C5 spinous process.  4. Mild grade 1 spondylolisthesis at C4-C5, C5-C6 and T2-T3.  5. Cervical spondylosis as described within the body of the report.  Spinal canal stenosis is greatest at C5-C6 (at least moderate at  this level). Multilevel bony neural foraminal narrowing.  CT abd/pelvis: Minimal left pleural effusion is noted.    No other traumatic injury seen in the chest, abdomen or pelvis.    Diverticulosis of descending and sigmoid colon is noted without  inflammation.    Moderate prostatic enlargement.    Extensive coronary artery calcifications are noted suggesting  coronary artery disease.    Aortic Atherosclerosis (ICD10-I70.0).   I agree with the radiologist interpretation   Cardiac Monitoring:  The patient was maintained on a cardiac monitor.  I personally viewed and interpreted the cardiac monitored which showed an underlying rhythm of: nsr   Medicines ordered and prescription drug management:  I ordered medication including  morphine/zofran  for sx  Reevaluation of the patient after these medicines showed that the patient improved I have reviewed the patients home medicines and have made adjustments as needed   Test Considered:  ct   Critical Interventions:  Trauma alert   Consultations Obtained:  I requested consultation with the NS (Dr. Franky Macho),  and discussed lab and imaging findings as well as pertinent plan - he will see him Pt d/w trauma who will consult.  They request medicine admission. Pt d/w Dr. Allena Katz ([facility]) for admission.   Problem List / ED Course:  SDH and cervical spine fx:  NS consulted.  Pt in c-collar.  He will need plavix held. Facial lacs:  repaired by Dr. Rodena Medin   Reevaluation:  After the interventions noted above, I reevaluated the patient and found that they have :improved   Social Determinants of Health:  Lives at home   Dispostion:  After consideration of the diagnostic results and the patients response to treatment, I feel that the patent would benefit from admission.  CRITICAL CARE Performed by: Jacalyn Lefevre   Total critical care time: 30 minutes  Critical care time was exclusive of separately billable procedures and treating other patients.  Critical care was necessary to treat or prevent imminent or life-threatening deterioration.  Critical care was time spent personally by me on the following activities: development of treatment plan with patient and/or surrogate as well as nursing, discussions with consultants, evaluation of patient's response to treatment, examination of patient, obtaining history from patient or surrogate, ordering and performing treatments and interventions, ordering and review of laboratory studies, ordering and review of radiographic studies, pulse oximetry and re-evaluation of patient's condition.           Final Clinical Impression(s) / ED Diagnoses Final diagnoses:  Fall, initial encounter  Other closed nondisplaced  fracture of first cervical vertebra, initial encounter (HCC)  Other closed displaced fracture of fifth cervical vertebra, initial encounter [facility])  Facial laceration, initial encounter    Rx / DC Orders ED Discharge Orders     None         Jacalyn Lefevre, MD 04/02/23 1529

## 2023-04-02 NOTE — Progress Notes (Signed)
 Patient ID: [name], male   DOB: 1928/03/20, 89 y.o.   MRN: [id number] BP 93/77   Pulse 83   Temp (!) 97.2 F (36.2 C) (Axillary)   Resp (!) 40   Ht 5\' 10"  (1.778 m)   Wt 89.4 kg   SpO2 98%   BMI 28.27 kg/m  I am unable at this time to view the CT angio cervical spine. The report states there is an abnormality in the vertebral artery on the left at C2. Given that he is on plavix I do not believe additional aspirin would be of any benefit.  The canopy pacs system remains highly problematic, and this is why I cannot read the film.

## 2023-04-02 NOTE — Progress Notes (Signed)
 Orthopedic Tech Progress Note Patient Details:  Shawn Andrews [birth year] [id number] Level 2 Trauma Patient ID: [name], male   DOB: Jun 27, 1928, 89 y.o.   MRN: [id number]  Smitty Pluck 04/02/2023, 11:26 AM

## 2023-04-02 NOTE — ED Notes (Signed)
 ED TO INPATIENT HANDOFF REPORT  ED Nurse Name and Phone #: Johnney Killian Name/Age/Gender Shawn Andrews 89 y.o. male Room/Bed: TRABC/TRABC  Code Status   Code Status: Full Code  Home/SNF/Other Home Patient oriented to: self, place, time, and situation Is this baseline? Yes   Triage Complete: Triage complete  Chief Complaint Fall [W19.XXXA]  Triage Note Pt bib ems from home c/o fall. Pt was in his yard when he fell down a 10 ft embankment hitting the ground and branches. Pt denies LOC. Pt is on a blood thinner medication. Pt has swelling to the upper right forehead, right eye swollen shut with visible hematoma, laceration to upper right lip, right forearm skin tear, left elbow skin tear, and left shin skin tear.   BP 159/76 HR 71 RA 99% GCS 15   Allergies Allergies  Allergen Reactions   Penicillins Swelling    Of tongue Has patient had a PCN reaction causing immediate rash, facial/tongue/throat swelling, SOB or lightheadedness with hypotension: Yes Has patient had a PCN reaction causing severe rash involving mucus membranes or skin necrosis: No Has patient had a PCN reaction that required hospitalization: No Has patient had a PCN reaction occurring within the last 10 years: No If all of the above answers are "NO", then may proceed with Cephalosporin use.   Cialis [Tadalafil] Other (See Comments)    Indigestion   Lipitor [Atorvastatin] Other (See Comments)    Severe Myalgias   Lisinopril Cough    Level of Care/Admitting Diagnosis ED Disposition     ED Disposition  Admit   Condition  --   Comment  Hospital Area: [facility] [[id number]]  Level of Care: Progressive [102]  Admit to Progressive based on following criteria: MULTISYSTEM THREATS such as stable sepsis, metabolic/electrolyte imbalance with or without encephalopathy that is responding to early treatment.  May admit patient to [facility] or [facility] if equivalent level of care is available::  No  Covid Evaluation: Asymptomatic - no recent exposure (last 10 days) testing not required  Diagnosis: Fall [[id number]]  Admitting Physician: Silvio Pate  Attending Physician: Gust Rung [[id number]]  Certification:: I certify this patient will need inpatient services for at least 2 midnights  Expected Medical Readiness: 04/07/2023          B Medical/Surgery History Past Medical History:  Diagnosis Date   Abnormal drug screen 06/22/2014   inapprop neg xanax rpt 3 mo (06/2014)   BPH (benign prostatic hypertrophy) 01/21/1998   has had 3 biopsies in past ([facility]) decided to stop PSA/DRE   CHF (congestive heart failure) (HCC)    Coronary artery disease    Hyperlipidemia 01/21/2002   Hypertension 05/22/2003   Left lumbar radiculopathy    Osteoarthritis 01/22/1988   knees, lumbar spondylosis and listhesis   Past Surgical History:  Procedure Laterality Date   CATARACT EXTRACTION  2013   bilateral   CORONARY ANGIOGRAPHY N/A 03/29/2022   Procedure: CORONARY ANGIOGRAPHY;  Surgeon: Alwyn Pea, MD;  Location: [facility] INVASIVE CV LAB;  Service: Cardiovascular;  Laterality: N/A;   CORONARY STENT INTERVENTION N/A 03/27/2022   Procedure: CORONARY STENT INTERVENTION;  Surgeon: Alwyn Pea, MD;  Location: [facility] INVASIVE CV LAB;  Service: Cardiovascular;  Laterality: N/A;   CORONARY STENT INTERVENTION N/A 03/29/2022   Procedure: CORONARY STENT INTERVENTION;  Surgeon: Alwyn Pea, MD;  Location: [facility] INVASIVE CV [facility];  Service: Cardiovascular;  Laterality: N/A;   ESI Left 08/2014, 11/2014, 08/2015   L  S1, L L5/S1 transforaminal ESI; L4/5 L5/S1 zygapophysial injections, L S1 transforaminal ([facility])   FINGER SURGERY     right middle, partial traumatic amputation   IR CATHETER TUBE CHANGE  03/02/2021   KNEE ARTHROSCOPY  1990   right   KNEE SURGERY  04/2006   right partial knee replacement in [place] - rec ppx abx for any invasive procedure   LEFT HEART CATH AND CORONARY  ANGIOGRAPHY N/A 01/18/2021   Procedure: LEFT HEART CATH AND CORONARY ANGIOGRAPHY;  Surgeon: Lamar Blinks, MD;  Location: [facility] INVASIVE CV LAB;  Service: Cardiovascular;  Laterality: N/A;   LEFT HEART CATH AND CORONARY ANGIOGRAPHY N/A 03/27/2022   Procedure: LEFT HEART CATH AND CORONARY ANGIOGRAPHY;  Surgeon: Alwyn Pea, MD;  Location: [facility] INV[facility]IVE CV LAB;  Service: Cardiovascular;  Laterality: N/A;   LEFT HEART CATH AND CORONARY ANGIOGRAPHY N/A 12/11/2022   Procedure: LEFT HEART CATH AND CORONARY ANGIOGRAPHY;  Surgeon: Marcina Millard, MD;  Location: [facility] INV[facility] CV [facility];  Service: Cardiovascular;  Laterality: N/A;     A IV Location/Drains/Wounds Patient Lines/Drains/Airways Status     Active Line/Drains/Airways     Name Placement date Placement time Site Days   Peripheral IV 04/02/23 18 G Left Antecubital 04/02/23  1119  Antecubital  less than 1   Suprapubic Catheter Latex 16 Fr. 08/10/22  1158  Latex  235            Intake/Output Last 24 hours  Intake/Output Summary (Last 24 hours) at 04/02/2023 1917 Last data filed at 04/02/2023 1546 Gross per 24 hour  Intake 500 ml  Output 200 ml  Net 300 ml    Labs/Imaging Results for orders placed or performed during the hospital encounter of 04/02/23 (from the past 48 hours)  Comprehensive metabolic panel     Status: Abnormal   Collection Time: 04/02/23 11:20 AM  Result Value Ref Range   Sodium 139 135 - 145 mmol/L   Potassium 4.5 3.5 - 5.1 mmol/L   Chloride 108 98 - 111 mmol/L   CO2 22 22 - 32 mmol/L   Glucose, Bld 130 (H) 70 - 99 mg/dL    Comment: Glucose reference range applies only to samples taken after fasting for at least 8 hours.   BUN 28 (H) 8 - 23 mg/dL   Creatinine, Ser 6.04 (H) 0.61 - 1.24 mg/dL   Calcium 8.3 (L) 8.9 - 10.3 mg/dL   Total Protein 5.9 (L) 6.5 - 8.1 g/dL   Albumin 3.6 3.5 - 5.0 g/dL   AST 21 15 - 41 U/L   ALT 15 0 - 44 U/L   Alkaline Phosphatase 49 38 - 126 U/L   Total Bilirubin 0.3  0.0 - 1.2 mg/dL   GFR, Estimated 34 (L) >60 mL/min    Comment: (NOTE) Calculated using the CKD-EPI Creatinine Equation (2021)    Anion gap 9 5 - 15    Comment: Performed at [facility], [address], [place], [place] [address]  CBC     Status: Abnormal   Collection Time: 04/02/23 11:20 AM  Result Value Ref Range   WBC 7.2 4.0 - 10.5 K/uL   RBC 3.64 (L) 4.22 - 5.81 MIL/uL   Hemoglobin 10.9 (L) 13.0 - 17.0 g/dL   HCT 11.9 (L) 14.7 - 82.9 %   MCV 88.7 80.0 - 100.0 fL   MCH 29.9 26.0 - 34.0 pg   MCHC 33.7 30.0 - 36.0 g/dL   RDW 56.2 13.0 - 86.5 %  Platelets 178 150 - 400 K/uL   nRBC 0.0 0.0 - 0.2 %    Comment: Performed at [facility], [address] [place], [place] [address]  Ethanol     Status: None   Collection Time: 04/02/23 11:20 AM  Result Value Ref Range   Alcohol, Ethyl (B) <10 <10 mg/dL    Comment: (NOTE) Lowest detectable limit for serum alcohol is 10 mg/dL.  For medical purposes only. Performed at [facility], [address], [place], [place] [address]   Urinalysis, Routine w reflex microscopic -Urine, Clean Catch     Status: Abnormal   Collection Time: 04/02/23 11:20 AM  Result Value Ref Range   Color, Urine YELLOW YELLOW   APPearance TURBID (A) CLEAR   Specific Gravity, Urine 1.021 1.005 - 1.030   pH 7.0 5.0 - 8.0   Glucose, UA NEGATIVE NEGATIVE mg/dL   Hgb urine dipstick NEGATIVE NEGATIVE   Bilirubin Urine NEGATIVE NEGATIVE   Ketones, ur NEGATIVE NEGATIVE mg/dL   Protein, ur 098 (A) NEGATIVE mg/dL   Nitrite NEGATIVE NEGATIVE   Leukocytes,Ua LARGE (A) NEGATIVE   RBC / HPF 6-10 0 - 5 RBC/hpf   WBC, UA >50 0 - 5 WBC/hpf   Bacteria, UA RARE (A) NONE SEEN   Squamous Epithelial / HPF 0-5 0 - 5 /HPF   Mucus PRESENT    Triple Phosphate Crystal PRESENT     Comment: Performed at [facility], [address], [place], [place] [address]  Protime-INR     Status: Abnormal   Collection Time: 04/02/23 11:20 AM  Result Value Ref Range    Prothrombin Time 15.4 (H) 11.4 - 15.2 seconds   INR 1.2 0.8 - 1.2    Comment: (NOTE) INR goal varies based on device and disease states. Performed at [facility], [address] [place], [place] [address]   Sample to Blood Bank     Status: None   Collection Time: 04/02/23 11:20 AM  Result Value Ref Range   Blood Bank Specimen SAMPLE AVAILABLE FOR TESTING    Sample Expiration      04/05/2023,2359 Performed at [facility], [address], [place], [place] [address]   I-Stat Chem 8, ED     Status: Abnormal   Collection Time: 04/02/23 11:30 AM  Result Value Ref Range   Sodium 137 135 - 145 mmol/L   Potassium 4.5 3.5 - 5.1 mmol/L   Chloride 108 98 - 111 mmol/L   BUN 29 (H) 8 - 23 mg/dL   Creatinine, Ser 8.65 (H) 0.61 - 1.24 mg/dL   Glucose, Bld 784 (H) 70 - 99 mg/dL    Comment: Glucose reference range applies only to samples taken after fasting for at least 8 hours.   Calcium, Ion 1.02 (L) 1.15 - 1.40 mmol/L   TCO2 21 (L) 22 - 32 mmol/L   Hemoglobin 9.5 (L) 13.0 - 17.0 g/dL   HCT 69.6 (L) 29.5 - 28.4 %  I-Stat Lactic Acid, ED     Status: Abnormal   Collection Time: 04/02/23 11:30 AM  Result Value Ref Range   Lactic Acid, Venous 2.0 (HH) 0.5 - 1.9 mmol/L   CT Angio Neck W and/or Wo Contrast Result Date: 04/02/2023 CLINICAL DATA:  Neck trauma, arterial injury suspected. EXAM: CT ANGIOGRAPHY NECK TECHNIQUE: Multidetector CT imaging of the neck was performed using the standard protocol during bolus administration of intravenous contrast. Multiplanar CT image reconstructions and MIPs were obtained  to evaluate the vascular anatomy. Carotid stenosis measurements (when applicable) are obtained utilizing NASCET criteria, using the distal internal carotid diameter as the denominator. RADIATION DOSE REDUCTION: This exam was performed according to the departmental dose-optimization program which includes automated exposure control, adjustment of the mA and/or kV according to patient  size and/or use of iterative reconstruction technique. CONTRAST:  75mL OMNIPAQUE IOHEXOL 350 MG/ML SOLN COMPARISON:  Cervical spine CT 04/02/2023 FINDINGS: Aortic arch: Standard branching with mild atherosclerosis. No significant stenosis of the arch vessel origins. Right carotid system: Patent with extensive calcified plaque in the carotid bulb resulting in 65% proximal ICA stenosis. Tortuous mid cervical ICA. No evidence of dissection. Left carotid system: Patent with a moderate amount of calcified plaque about the carotid bifurcation. No evidence of a significant stenosis or dissection. Tortuous distal cervical ICA. Vertebral arteries: Patent without evidence of a significant stenosis or dissection of the dominant left vertebral artery. In the right V3 segment at the C2 level, there is an apparent 4 mm long segment of diminished vessel opacification and severe stenosis, possibly with an intimal flap or thrombus (series 8, images 91 and 92), although streak artifact in this region could also be contributing to this appearance with mild artifact noted in the left vertebral artery at this same level. The right V3 segment is widely patent more distally. No pseudoaneurysm. Skeleton: Left C1 and C5 transverse process fractures and C5 spinous process fracture as described on today's earlier spine CT. Other neck: No evidence of cervical lymphadenopathy or mass. Upper chest: More fully evaluated on today's earlier chest CT. IMPRESSION: 1. Focally abnormal appearance of the right vertebral artery at the C2 level with apparent severe stenosis. In the setting of trauma, this is suspicious for grade 2 blunt cerebrovascular injury although streak artifact may also be contributing. 2. No evidence of acute arterial injury elsewhere in the neck. 3. 65% proximal right ICA stenosis. 4.  Aortic Atherosclerosis (ICD10-I70.0). Electronically Signed   By: Sebastian Ache M.D.   On: 04/02/2023 17:23   CT CHEST ABDOMEN PELVIS WO  CONTRAST Result Date: 04/02/2023 CLINICAL DATA:  Fall. EXAM: CT CHEST, ABDOMEN AND PELVIS WITHOUT CONTRAST TECHNIQUE: Multidetector CT imaging of the chest, abdomen and pelvis was performed following the standard protocol without IV contrast. RADIATION DOSE REDUCTION: This exam was performed according to the departmental dose-optimization program which includes automated exposure control, adjustment of the mA and/or kV according to patient size and/or use of iterative reconstruction technique. COMPARISON:  August 08, 2022. FINDINGS: CT CHEST FINDINGS Cardiovascular: Atherosclerosis of thoracic aorta without aneurysm formation. Normal cardiac size. No pericardial effusion. Coronary artery calcifications are noted consistent with coronary artery disease. Mediastinum/Nodes: No enlarged mediastinal, hilar, or axillary lymph nodes. Thyroid gland, trachea, and esophagus demonstrate no significant findings. Lungs/Pleura: Minimal left pleural effusion is noted. No pneumothorax is noted. No acute consolidative process is noted. Musculoskeletal: No chest wall mass or suspicious bone lesions identified. CT ABDOMEN PELVIS FINDINGS Hepatobiliary: No focal liver abnormality is seen. No gallstones, gallbladder wall thickening, or biliary dilatation. Pancreas: Unremarkable. No pancreatic ductal dilatation or surrounding inflammatory changes. Spleen: Normal in size without focal abnormality. Adrenals/Urinary Tract: Adrenal glands appear normal. Bilateral renal cysts are noted for which no further follow-up is required. No hydronephrosis or renal obstruction is noted. Urinary bladder is decompressed secondary to suprapubic catheter. Stomach/Bowel: Stomach is unremarkable. There is no evidence of bowel obstruction or inflammation. The appendix is unremarkable. Diverticulosis of descending and sigmoid colon is noted without inflammation. Vascular/Lymphatic: Aortic  atherosclerosis. No enlarged abdominal or pelvic lymph nodes.  Reproductive: Moderate prostatic enlargement is noted. Other: No abdominal wall hernia or abnormality. No abdominopelvic ascites. Musculoskeletal: Mild grade 1 anterolisthesis of L5-S1 is noted secondary to bilateral L5 spondylolysis. No acute osseous abnormality is noted. IMPRESSION: Minimal left pleural effusion is noted. No other traumatic injury seen in the chest, abdomen or pelvis. Diverticulosis of descending and sigmoid colon is noted without inflammation. Moderate prostatic enlargement. Extensive coronary artery calcifications are noted suggesting coronary artery disease. Aortic Atherosclerosis (ICD10-I70.0). Electronically Signed   By: Lupita Raider M.D.   On: 04/02/2023 15:01   CT HEAD WO CONTRAST Result Date: 04/02/2023 CLINICAL DATA:  Provided history: Facial trauma, blunt. Additional history provided: Fall (hitting ground and branches). On blood thinner. Right forehead swelling. Right eye swelling. Upper lip laceration. EXAM: CT HEAD WITHOUT CONTRAST CT MAXILLOFACIAL WITHOUT CONTRAST CT CERVICAL SPINE WITHOUT CONTRAST TECHNIQUE: Multidetector CT imaging of the head, cervical spine, and maxillofacial structures were performed using the standard protocol without intravenous contrast. Multiplanar CT image reconstructions of the cervical spine and maxillofacial structures were also generated. RADIATION DOSE REDUCTION: This exam was performed according to the departmental dose-optimization program which includes automated exposure control, adjustment of the mA and/or kV according to patient size and/or use of iterative reconstruction technique. COMPARISON:  Head CT 08/09/2022. FINDINGS: CT HEAD FINDINGS Brain: Generalized cerebral atrophy. Acute subdural hematoma along the left aspect of the falx measuring up to 3 mm in thickness (for instance as seen on series 3, image 25). No demarcated cortical infarct. No evidence of an intracranial mass. No midline shift. Vascular: No hyperdense vessel.   Atherosclerotic calcifications. Skull: No calvarial fracture or aggressive osseous lesion. Other: Large right anterior scalp, forehead and periorbital hematoma. Forehead laceration. CT MAXILLOFACIAL FINDINGS Osseous: No acute maxillofacial fracture is identified. Orbits: Large right periorbital hematoma. No acute finding within the orbits. Sinuses: Moderate polypoid mucosal thickening within the right maxillary sinus. Moderate mucosal thickening within the left maxillary sinus. Minimal mucosal thickening within bilateral ethmoid air cells. Soft tissues: Large right anterior scalp, forehead and periorbital hematoma. Forehead laceration. CT CERVICAL SPINE FINDINGS Alignment: Slight C4-C5 grade 1 anterolisthesis. Slight C5-C6 grade 1 retrolisthesis. Slight T2-T3 grade 1 anterolisthesis. Skull base and vertebrae: The basion-dental and atlanto-dental intervals are maintained. Acute displaced fracture of the left C1 transverse process (lateral to the foramen transversarium) (for instance as seen on series 9, image 66) (series 4, image 30). Mildly displaced acute fracture through the left C5 foramen transversarium (series 10, images 50-51). Acute, displaced fracture of the C5 spinous process (series 10, image 57). No other acute cervical spine fracture is identified. Soft tissues and spinal canal: No prevertebral fluid or swelling. No visible canal hematoma. Disc levels: Cervical spondylosis with multilevel disc space narrowing, disc bulges/central disc protrusions, uncovertebral hypertrophy and facet arthropathy. Disc space narrowing is greatest at C4-C5 and C5-C6 (moderate to advanced at these levels). Multilevel spinal canal narrowing. Most notably at C5-C6, a central disc protrusion contributes to at least moderate spinal canal stenosis. Multilevel bony neural foraminal narrowing. Upper chest: No consolidation within the imaged lung apices. No visible pneumothorax. Acute findings called by telephone at the time of  interpretation on 04/02/2023 at 1:49 pm to provider JULIE HAVILAND , who verbally acknowledged these results. IMPRESSION: CT head: 1. Acute subdural hematoma along the left aspect of the falx, measuring up to 3 mm in thickness. 2. Large right anterior scalp, forehead and periorbital hematoma. 3. Forehead laceration. 4. Cerebral atrophy.  CT maxillofacial: 1. No evidence of an acute maxillofacial fracture. 2. Large right anterior scalp, forehead and periorbital hematoma. 3. Forehead laceration. 4. Paranasal sinus disease as described. CT cervical spine: 1. Acute displaced fracture of the left C1 transverse process (lateral to the foramen transversarium). 2. Acute mildly displaced fracture through the left C5 foramen transversarium. A CTA of the neck is recommended to exclude traumatic injury to the left vertebral artery. 3. Acute, displaced fracture of the C5 spinous process. 4. Mild grade 1 spondylolisthesis at C4-C5, C5-C6 and T2-T3. 5. Cervical spondylosis as described within the body of the report. Spinal canal stenosis is greatest at C5-C6 (at least moderate at this level). Multilevel bony neural foraminal narrowing. Electronically Signed   By: Jackey Loge D.O.   On: 04/02/2023 13:51   CT MAXILLOFACIAL WO CONTRAST Result Date: 04/02/2023 CLINICAL DATA:  Provided history: Facial trauma, blunt. Additional history provided: Fall (hitting ground and branches). On blood thinner. Right forehead swelling. Right eye swelling. Upper lip laceration. EXAM: CT HEAD WITHOUT CONTRAST CT MAXILLOFACIAL WITHOUT CONTRAST CT CERVICAL SPINE WITHOUT CONTRAST TECHNIQUE: Multidetector CT imaging of the head, cervical spine, and maxillofacial structures were performed using the standard protocol without intravenous contrast. Multiplanar CT image reconstructions of the cervical spine and maxillofacial structures were also generated. RADIATION DOSE REDUCTION: This exam was performed according to the departmental dose-optimization  program which includes automated exposure control, adjustment of the mA and/or kV according to patient size and/or use of iterative reconstruction technique. COMPARISON:  Head CT 08/09/2022. FINDINGS: CT HEAD FINDINGS Brain: Generalized cerebral atrophy. Acute subdural hematoma along the left aspect of the falx measuring up to 3 mm in thickness (for instance as seen on series 3, image 25). No demarcated cortical infarct. No evidence of an intracranial mass. No midline shift. Vascular: No hyperdense vessel.  Atherosclerotic calcifications. Skull: No calvarial fracture or aggressive osseous lesion. Other: Large right anterior scalp, forehead and periorbital hematoma. Forehead laceration. CT MAXILLOFACIAL FINDINGS Osseous: No acute maxillofacial fracture is identified. Orbits: Large right periorbital hematoma. No acute finding within the orbits. Sinuses: Moderate polypoid mucosal thickening within the right maxillary sinus. Moderate mucosal thickening within the left maxillary sinus. Minimal mucosal thickening within bilateral ethmoid air cells. Soft tissues: Large right anterior scalp, forehead and periorbital hematoma. Forehead laceration. CT CERVICAL SPINE FINDINGS Alignment: Slight C4-C5 grade 1 anterolisthesis. Slight C5-C6 grade 1 retrolisthesis. Slight T2-T3 grade 1 anterolisthesis. Skull base and vertebrae: The basion-dental and atlanto-dental intervals are maintained. Acute displaced fracture of the left C1 transverse process (lateral to the foramen transversarium) (for instance as seen on series 9, image 66) (series 4, image 30). Mildly displaced acute fracture through the left C5 foramen transversarium (series 10, images 50-51). Acute, displaced fracture of the C5 spinous process (series 10, image 57). No other acute cervical spine fracture is identified. Soft tissues and spinal canal: No prevertebral fluid or swelling. No visible canal hematoma. Disc levels: Cervical spondylosis with multilevel disc space  narrowing, disc bulges/central disc protrusions, uncovertebral hypertrophy and facet arthropathy. Disc space narrowing is greatest at C4-C5 and C5-C6 (moderate to advanced at these levels). Multilevel spinal canal narrowing. Most notably at C5-C6, a central disc protrusion contributes to at least moderate spinal canal stenosis. Multilevel bony neural foraminal narrowing. Upper chest: No consolidation within the imaged lung apices. No visible pneumothorax. Acute findings called by telephone at the time of interpretation on 04/02/2023 at 1:49 pm to provider JULIE HAVILAND , who verbally acknowledged these results. IMPRESSION: CT head: 1. Acute  subdural hematoma along the left aspect of the falx, measuring up to 3 mm in thickness. 2. Large right anterior scalp, forehead and periorbital hematoma. 3. Forehead laceration. 4. Cerebral atrophy. CT maxillofacial: 1. No evidence of an acute maxillofacial fracture. 2. Large right anterior scalp, forehead and periorbital hematoma. 3. Forehead laceration. 4. Paranasal sinus disease as described. CT cervical spine: 1. Acute displaced fracture of the left C1 transverse process (lateral to the foramen transversarium). 2. Acute mildly displaced fracture through the left C5 foramen transversarium. A CTA of the neck is recommended to exclude traumatic injury to the left vertebral artery. 3. Acute, displaced fracture of the C5 spinous process. 4. Mild grade 1 spondylolisthesis at C4-C5, C5-C6 and T2-T3. 5. Cervical spondylosis as described within the body of the report. Spinal canal stenosis is greatest at C5-C6 (at least moderate at this level). Multilevel bony neural foraminal narrowing. Electronically Signed   By: Jackey Loge D.O.   On: 04/02/2023 13:51   CT CERVICAL SPINE WO CONTRAST Result Date: 04/02/2023 CLINICAL DATA:  Provided history: Facial trauma, blunt. Additional history provided: Fall (hitting ground and branches). On blood thinner. Right forehead swelling. Right eye  swelling. Upper lip laceration. EXAM: CT HEAD WITHOUT CONTRAST CT MAXILLOFACIAL WITHOUT CONTRAST CT CERVICAL SPINE WITHOUT CONTRAST TECHNIQUE: Multidetector CT imaging of the head, cervical spine, and maxillofacial structures were performed using the standard protocol without intravenous contrast. Multiplanar CT image reconstructions of the cervical spine and maxillofacial structures were also generated. RADIATION DOSE REDUCTION: This exam was performed according to the departmental dose-optimization program which includes automated exposure control, adjustment of the mA and/or kV according to patient size and/or use of iterative reconstruction technique. COMPARISON:  Head CT 08/09/2022. FINDINGS: CT HEAD FINDINGS Brain: Generalized cerebral atrophy. Acute subdural hematoma along the left aspect of the falx measuring up to 3 mm in thickness (for instance as seen on series 3, image 25). No demarcated cortical infarct. No evidence of an intracranial mass. No midline shift. Vascular: No hyperdense vessel.  Atherosclerotic calcifications. Skull: No calvarial fracture or aggressive osseous lesion. Other: Large right anterior scalp, forehead and periorbital hematoma. Forehead laceration. CT MAXILLOFACIAL FINDINGS Osseous: No acute maxillofacial fracture is identified. Orbits: Large right periorbital hematoma. No acute finding within the orbits. Sinuses: Moderate polypoid mucosal thickening within the right maxillary sinus. Moderate mucosal thickening within the left maxillary sinus. Minimal mucosal thickening within bilateral ethmoid air cells. Soft tissues: Large right anterior scalp, forehead and periorbital hematoma. Forehead laceration. CT CERVICAL SPINE FINDINGS Alignment: Slight C4-C5 grade 1 anterolisthesis. Slight C5-C6 grade 1 retrolisthesis. Slight T2-T3 grade 1 anterolisthesis. Skull base and vertebrae: The basion-dental and atlanto-dental intervals are maintained. Acute displaced fracture of the left C1  transverse process (lateral to the foramen transversarium) (for instance as seen on series 9, image 66) (series 4, image 30). Mildly displaced acute fracture through the left C5 foramen transversarium (series 10, images 50-51). Acute, displaced fracture of the C5 spinous process (series 10, image 57). No other acute cervical spine fracture is identified. Soft tissues and spinal canal: No prevertebral fluid or swelling. No visible canal hematoma. Disc levels: Cervical spondylosis with multilevel disc space narrowing, disc bulges/central disc protrusions, uncovertebral hypertrophy and facet arthropathy. Disc space narrowing is greatest at C4-C5 and C5-C6 (moderate to advanced at these levels). Multilevel spinal canal narrowing. Most notably at C5-C6, a central disc protrusion contributes to at least moderate spinal canal stenosis. Multilevel bony neural foraminal narrowing. Upper chest: No consolidation within the imaged lung apices. No  visible pneumothorax. Acute findings called by telephone at the time of interpretation on 04/02/2023 at 1:49 pm to provider JULIE HAVILAND , who verbally acknowledged these results. IMPRESSION: CT head: 1. Acute subdural hematoma along the left aspect of the falx, measuring up to 3 mm in thickness. 2. Large right anterior scalp, forehead and periorbital hematoma. 3. Forehead laceration. 4. Cerebral atrophy. CT maxillofacial: 1. No evidence of an acute maxillofacial fracture. 2. Large right anterior scalp, forehead and periorbital hematoma. 3. Forehead laceration. 4. Paranasal sinus disease as described. CT cervical spine: 1. Acute displaced fracture of the left C1 transverse process (lateral to the foramen transversarium). 2. Acute mildly displaced fracture through the left C5 foramen transversarium. A CTA of the neck is recommended to exclude traumatic injury to the left vertebral artery. 3. Acute, displaced fracture of the C5 spinous process. 4. Mild grade 1 spondylolisthesis at  C4-C5, C5-C6 and T2-T3. 5. Cervical spondylosis as described within the body of the report. Spinal canal stenosis is greatest at C5-C6 (at least moderate at this level). Multilevel bony neural foraminal narrowing. Electronically Signed   By: Jackey Loge D.O.   On: 04/02/2023 13:51   DG Pelvis Portable Result Date: 04/02/2023 CLINICAL DATA:  Trauma.  Fall. EXAM: PORTABLE PELVIS 1-2 VIEWS COMPARISON:  None Available. FINDINGS: Pelvis is intact with normal and symmetric sacroiliac joints. No acute fracture or dislocation. No aggressive osseous lesion. Visualized sacral arcuate lines are unremarkable. Unremarkable symphysis pubis. There are mild degenerative changes of bilateral hip joints without significant joint space narrowing. Osteophytosis of the superior acetabulum. No radiopaque foreign bodies. IMPRESSION: No acute osseous abnormality of the pelvis. Electronically Signed   By: Jules Schick M.D.   On: 04/02/2023 13:23   DG Chest Port 1 View Result Date: 04/02/2023 CLINICAL DATA:  Trauma. EXAM: PORTABLE CHEST 1 VIEW COMPARISON:  12/09/2022 FINDINGS: Low lung volume. Bilateral lung fields are clear. Bilateral costophrenic angles are clear. Normal cardio-mediastinal silhouette. No acute osseous abnormalities. No acute displaced rib fracture seen. The soft tissues are within normal limits. IMPRESSION: No active disease. Electronically Signed   By: Jules Schick M.D.   On: 04/02/2023 13:22    Pending Labs Unresulted Labs (From admission, onward)     Start     Ordered   04/03/23 0500  Basic metabolic panel  Tomorrow morning,   R        04/02/23 1631   04/03/23 0500  CBC  Tomorrow morning,   R        04/02/23 1631   04/03/23 0500  Protime-INR  Tomorrow morning,   R        04/02/23 1631   04/03/23 0500  APTT  Tomorrow morning,   R        04/02/23 1631   04/02/23 1930  CBC  Once,   STAT        04/02/23 1930            Vitals/Pain Today's Vitals   04/02/23 1630 04/02/23 1700 04/02/23  1707 04/02/23 1716  BP: 112/62 (!) 122/52    Pulse: 72 78    Resp: (!) 34 (!) 28    Temp:      TempSrc:      SpO2: 98% 96%    Weight:      Height:      PainSc:   8  8     Isolation Precautions No active isolations  Medications Medications  HYDROmorphone (DILAUDID) injection 0.5 mg (0.5 mg Intravenous Given  04/02/23 1707)  acetaminophen (TYLENOL) tablet 650 mg (has no administration in time range)  oxidized cellulose (Surgicel) pad 1 each (has no administration in time range)  clopidogrel (PLAVIX) tablet 75 mg (has no administration in time range)  cyanocobalamin (VITAMIN B12) tablet 1,000 mcg (has no administration in time range)  docusate sodium (COLACE) capsule 100 mg (has no administration in time range)  famotidine (PEPCID) tablet 20 mg (has no administration in time range)  finasteride (PROSCAR) tablet 5 mg (has no administration in time range)  pantoprazole (PROTONIX) EC tablet 40 mg (has no administration in time range)  primidone (MYSOLINE) tablet 100 mg (has no administration in time range)  rosuvastatin (CRESTOR) tablet 20 mg (has no administration in time range)  sucralfate (CARAFATE) 1 GM/10ML suspension 1 g (has no administration in time range)  Tdap (BOOSTRIX) injection 0.5 mL (0.5 mLs Intramuscular Given 04/02/23 1123)  lidocaine-EPINEPHrine (XYLOCAINE W/EPI) 2 %-1:200000 (PF) injection 10 mL (10 mLs Infiltration Given 04/02/23 1429)  morphine (PF) 4 MG/ML injection 4 mg (4 mg Intravenous Given 04/02/23 1431)  ondansetron (ZOFRAN) injection 4 mg (4 mg Intravenous Given 04/02/23 1430)  sodium chloride 0.9 % bolus 500 mL (0 mLs Intravenous Stopped 04/02/23 1500)  bacitracin 500 UNIT/GM ointment (  Given 04/02/23 1511)  iohexol (OMNIPAQUE) 350 MG/ML injection 75 mL (75 mLs Intravenous Contrast Given 04/02/23 1535)  lactated ringers bolus 1,000 mL (1,000 mLs Intravenous New Bag/Given 04/02/23 1710)    Mobility walks     Focused Assessments Neuro Assessment  Handoff:  Swallow screen pass?  Cardiac Rhythm: Normal sinus rhythm       Neuro Assessment:   Neuro Checks:      Has TPA been given? No If patient is a Neuro Trauma and patient is going to OR before floor call report to 4N Charge nurse: [phone] or [phone]   R Recommendations: See Admitting Provider Note  Report given to:   Additional Notes: C1 nondisplaced fx - [place] J collar in place

## 2023-04-02 NOTE — ED Notes (Addendum)
 Pt continues to cough up blood, has blood running down back of throat- holding his own suction- has suctioned approx 200cc bright red blood since arrival.   Anell Barr, RN BSN  Trauma Response Nurse

## 2023-04-02 NOTE — ED Notes (Signed)
 Pt head incision still draining blood. Dr. Derrell Lolling recommend to re-wrap with ace bandage.   RN attempted to wrap with ace bandage that slid off. Pt has coban wrap around incision on forehead. Pt was provided c-spine while completing dressing change.    Pt applied 2L nasal canula d/t O2 dipping to 88% after Dilaudid medication.

## 2023-04-02 NOTE — H&P (Cosign Needed Addendum)
 Date: 04/02/2023               Patient Name:  Shawn Andrews MRN: [id number]  DOB: 04-Oct-1928 Age / Sex: 89 y.o., male   PCP: Mick Sell, MD         Medical Service: Internal Medicine Teaching Service         Attending Physician: Dr. Gust Rung, DO      First Contact: Dr. Jeral Pinch, DO Pager [phone]    Second Contact: Dr. Modena Slater, DO Pager [phone]         After Hours (After 5p/  First Contact Pager: [phone]  weekends / holidays): Second Contact Pager: [phone]   SUBJECTIVE   Chief Complaint: "Mechanical fall"   History of Present Illness:   This is a 89 year old male with past medical history of CAD s/p PCI and stent LAD with DES on aspirin and plavix, HFrEF, tremor, hypertension, hyperlipidemia, CKD stage IIIb, BPH presents to ED today for mechanical fall.  Patient reports roughly around 9:30 AM this morning, he was working in his backyard in the woods.  Reports that he was trying to throw down a bush down the hill, however he lost his balance, fell and slid down the hill. He crawled out of the woods into the yard and called his wife. His wife called out to the neighbor who was walking his dog at the time. Neighbor came to help him who is at bedside  He denies any prodrome symptoms of dizziness or lightheadedness.  Denies any loss of conscious.  He is able to recall all events.  Wife at bedside denies any recent illness.  Reports that he is pretty independent.  Wife reports that he rode his bike for 3 miles without any issues today.   Presently, he reports neck pain and back pain. Denies any chest pain, shortness of breath, abdominal pain.   ED Course: Trauma Team consulted Patient placed on C collar NSGY consulted  Scalp laceration repaired   Past Medical History: Past Medical History:  Diagnosis Date   Abnormal drug screen 06/22/2014   inapprop neg xanax rpt 3 mo (06/2014)   BPH (benign prostatic hypertrophy) 01/21/1998   has had 3 biopsies in  past ([facility]) decided to stop PSA/DRE   CHF (congestive heart failure) (HCC)    Coronary artery disease    Hyperlipidemia 01/21/2002   Hypertension 05/22/2003   Left lumbar radiculopathy    Osteoarthritis 01/22/1988   knees, lumbar spondylosis and listhesis    Meds:  Tylenol 650 mg twice daily Aspirin 81 mg daily Vitamin D3 1000 units daily Plavix 75 mg daily- night  Coenzyme Q10 10 mg daily Vitamin B12 1000 mcg daily Colace 100 mg daily Doxazosin 4 mg nightly Pepcid 20 mg daily Finasteride 5 mg daily Lasix 20 mg every other day Hydralazine 50 mg 3 times daily- only if BP is high Metoprolol succinate 25 mg twice daily Nitroglycerin Protonix 40 mg twice daily Primidone 50 mg 2 tablets by mouth twice daily with extra 2 tablets in the afternoon if needed Crestor 20 mg daily Carafate 10 mL twice daily before meals Ciprofloxacin 500 mg-only takes when goes to dentist Imdur 60 mg twice daily   Past Surgical History:  Procedure Laterality Date   CATARACT EXTRACTION  2013   bilateral   CORONARY ANGIOGRAPHY N/A 03/29/2022   Procedure: CORONARY ANGIOGRAPHY;  Surgeon: Alwyn Pea, MD;  Location: [facility];  Service: Cardiovascular;  Laterality: N/A;  CORONARY STENT INTERVENTION N/A 03/27/2022   Procedure: CORONARY STENT INTERVENTION;  Surgeon: Alwyn Pea, MD;  Location: [facility];  Service: Cardiovascular;  Laterality: N/A;   CORONARY STENT INTERVENTION N/A 03/29/2022   Procedure: CORONARY STENT INTERVENTION;  Surgeon: Alwyn Pea, MD;  Location: [facility];  Service: Cardiovascular;  Laterality: N/A;   ESI Left 08/2014, 11/2014, 08/2015   L S1, L L5/S1 transforaminal ESI; L4/5 L5/S1 zygapophysial injections, L S1 transforaminal ([facility])   FINGER SURGERY     right middle, partial traumatic amputation   IR CATHETER TUBE CHANGE  03/02/2021   KNEE ARTHROSCOPY  1990   right   KNEE SURGERY  04/2006   right partial knee replacement in  [place] - rec ppx abx for any invasive procedure   LEFT HEART CATH AND CORONARY ANGIOGRAPHY N/A 01/18/2021   Procedure: LEFT HEART CATH AND CORONARY ANGIOGRAPHY;  Surgeon: Lamar Blinks, MD;  Location: [facility];  Service: Cardiovascular;  Laterality: N/A;   LEFT HEART CATH AND CORONARY ANGIOGRAPHY N/A 03/27/2022   Procedure: LEFT HEART CATH AND CORONARY ANGIOGRAPHY;  Surgeon: Alwyn Pea, MD;  Location: [facility];  Service: Cardiovascular;  Laterality: N/A;   LEFT HEART CATH AND CORONARY ANGIOGRAPHY N/A 12/11/2022   Procedure: LEFT HEART CATH AND CORONARY ANGIOGRAPHY;  Surgeon: Marcina Millard, MD;  Location: [facility];  Service: Cardiovascular;  Laterality: N/A;    Social:  Living Situation: Lives with his wife in [place] Son lives near [place] Fully independent ADLs and iADLS PCP: Mick Sell, MD Tobacco: Currently not smoking  Alcohol: None Drugs: None  Family History: noncontributory  Allergies: Allergies as of 04/02/2023 - Review Complete 04/02/2023  Allergen Reaction Noted   Penicillins Swelling 05/02/2008   Cialis [tadalafil] Other (See Comments) 11/13/2011   Lipitor [atorvastatin] Other (See Comments) 07/19/2015   Lisinopril Cough 05/31/2019    Review of Systems: A complete ROS was negative except as per HPI.   OBJECTIVE:   Physical Exam: Blood pressure (!) 109/53, pulse 78, temperature 97.6 F (36.4 C), resp. rate (!) 28, height 5\' 10"  (1.778 m), weight 89.4 kg, SpO2 96%.    Constitutional: Awake, Alert, answering questions appropriately, laying with head of bed elevated, no acute distress. HENT: Right forehead hematoma and laceration [dressed by EDP], dressing on the bridge of nose, swollen-bluish lips covered with blood, no anterior nasal bleed.  Eyes:  Right periorbital edema and ecchymosis, PERRL and EOMI  Neck: C collar in place  Cardiovascular: regular rate, no murmur. Pulmonary/Chest: + Coarse  crackles Abdominal: soft, non-tender, non-distended, bowel sounds present MSK: No lower extremity edema bilaterally, + left anterior shin abrasions  Neurological: alert & oriented x 3, able to move all extremities.   Psych: Pleasant  Labs: CBC    Component Value Date/Time   WBC 7.2 04/02/2023 1120   RBC 3.64 (L) 04/02/2023 1120   HGB 9.5 (L) 04/02/2023 1130   HCT 28.0 (L) 04/02/2023 1130   PLT 178 04/02/2023 1120   MCV 88.7 04/02/2023 1120   MCH 29.9 04/02/2023 1120   MCHC 33.7 04/02/2023 1120   RDW 13.5 04/02/2023 1120   LYMPHSABS 0.3 (L) 08/10/2022 0806   MONOABS 0.2 08/10/2022 0806   EOSABS 0.0 08/10/2022 0806   BASOSABS 0.0 08/10/2022 0806     CMP     Component Value Date/Time   NA 137 04/02/2023 1130   K 4.5 04/02/2023 1130   CL 108 04/02/2023 1130  CO2 22 04/02/2023 1120   GLUCOSE 120 (H) 04/02/2023 1130   BUN 29 (H) 04/02/2023 1130   CREATININE 2.00 (H) 04/02/2023 1130   CALCIUM 8.3 (L) 04/02/2023 1120   PROT 5.9 (L) 04/02/2023 1120   ALBUMIN 3.6 04/02/2023 1120   AST 21 04/02/2023 1120   ALT 15 04/02/2023 1120   ALKPHOS 49 04/02/2023 1120   BILITOT 0.3 04/02/2023 1120   GFRNONAA 34 (L) 04/02/2023 1120   GFRAA >60 08/02/2019 1203    Imaging:  DG chest port 1 No evidence of an acute maxillofacial fracture.   DG pelvis portable IMPRESSION: No acute osseous abnormality of the pelvis.  CTH/CT [place]/CT Cervical  IMPRESSION: CT head:   1. Acute subdural hematoma along the left aspect of the falx, measuring up to 3 mm in thickness. 2. Large right anterior scalp, forehead and periorbital hematoma. 3. Forehead laceration. 4. Cerebral atrophy.   CT maxillofacial:   1. No evidence of an acute maxillofacial fracture. 2. Large right anterior scalp, forehead and periorbital hematoma. 3. Forehead laceration. 4. Paranasal sinus disease as described.   CT cervical spine:   1. Acute displaced fracture of the left C1 transverse process (lateral to  the foramen transversarium). 2. Acute mildly displaced fracture through the left C5 foramen transversarium. A CTA of the neck is recommended to exclude traumatic injury to the left vertebral artery. 3. Acute, displaced fracture of the C5 spinous process. 4. Mild grade 1 spondylolisthesis at C4-C5, C5-C6 and T2-T3. 5. Cervical spondylosis as described within the body of the report. Spinal canal stenosis is greatest at C5-C6 (at least moderate at this level). Multilevel bony neural foraminal narrowing.  CT CAP IMPRESSION: Minimal left pleural effusion is noted.   No other traumatic injury seen in the chest, abdomen or pelvis.   Diverticulosis of descending and sigmoid colon is noted without inflammation.   Moderate prostatic enlargement.   Extensive coronary artery calcifications are noted suggesting coronary artery disease.   EKG: None per this visit   ASSESSMENT & PLAN:   Assessment & Plan by Problem: Principal Problem:   Fall Active Problems:   Essential hypertension   Benign prostatic hyperplasia   Chronic kidney disease, stage 3b (HCC)   CAD (coronary artery disease)   Suprapubic catheter (HCC)   Dyslipidemia   Subdural hematoma (HCC)   Cervical transverse process fracture (HCC)   Fracture of cervical spinous process (HCC)   Shawn Andrews is a 89 y.o. male with past medical history of CAD s/p PCI and stent LAD with DES on aspirin and plavix, HFrEF,  hypertension, hyperlipidemia, CKD stage IIIb, BPH presents to ED today for mechanical fall sustaining multiple of cervical injuries, hematomas, subdural hematoma, admitted for for further workup.  Mechanical fall Cervical fractures at C1 and C5 3 mm anterior left fall seen subdural hematoma Large R anterior Forehead Laceration- repaired  Presented to ED after mechanical fall this AM without any prodromal symptoms sustaining subdural hematoma, lateral anterior scalp, forehead and periorbital hematomas, forehead  laceration, acute displaced fractures of C1 and C5 and facial abrasions. EDP provider repaired forehead laceration, that appears to be still draining. NSGY consulted by EDP recommended repeating CTH evening and continue Plavix.   Plan: - Appreciate NSGY recommendations - Repeat CTH evening - Continue Plavix, hold aspirin  - Monitor CBC   Possible Posterior Epistaxis  He continues to suction out blood from his mouth. Does not appear to have anterior nasal bleed. Does not appear to have bleed from his  mouth. His tongue appears bruised, but no active bleeding noticed from the tongue. No identifiable source present, highly suspicious for posterior oropharynx or nasal bleed. CT Maxillofacial without any evidence of acute maxillofacial fracture.  - ENT consulted to evaluate for posterior bleed and possible packing  Normocytic Anemia Hgb stable 10.9, baseline ~9-10.  - Follow AM CBC  CAD NSTEMI s/p stent to in-stent restenosis of mid LAD (03/2022)  Two overlapping DES stent to proximal/mid LAD (09/2020), stent to proximal RCA.  - Hold Plavix 75 mg and Aspirin until evaluated by ENT   Chronic HFrEF Hypertension Last ECHO 11/2022 showed EF 45 to 50% with regional wall motion abnormalities and normal RV function. Home medications consist of metoprolol 25 mg twice daily, Imdur 60 mg twice daily, hydralazine 50 mg 3 times daily, Lasix 20 mg every other day. -Hold home GDMT in the setting of hypotension and active bleed  Hyperlipidemia,  Continue home medication Crestor  CKD stage 3a BPH Suprapubic catheter 2/2 urinary retention  Follows Urology and had it replaced on 03/12/2023. Scr 1.85, baseline around 1.8  - UA with large leukocytes and rare bacteria, consistent with previous UA, likely chronic colonization.    - Avoid nephrotoxic agents  Essential tremors  -Continue home medication primidone 50 mg, 2 tablets by mouth 3 times daily for tremors  GERD -Continue home medication Protonix 40  mg daily and famotidine 20 mg daily  Diverticulosis CTAP noted diverticulosis of descending and sigmoid colon is noted without inflammation. Bowel regimen.   Diet: NPO until bedside swallow and seen by ENT VTE: SCDs IVF: LR,Bolus Code: Full  Prior to Admission Living Arrangement: Home, living wife Anticipated Discharge Location: Home Barriers to Discharge: Medical management  Dispo: Admit patient to Inpatient with expected length of stay greater than 2 midnights.  Signed: Jeral Pinch, DO Internal Medicine Resident PGY-1  04/02/2023, 7:24 PM

## 2023-04-02 NOTE — Consult Note (Signed)
 Reason for Consult:falcine subdural hematoma, C1, and C5 fracture Referring Physician: [name] is an 89 y.o. male.  HPI: who while walking on his property, fell and rolled down an embankment striking a number of bushes. This left him with cervical fractures at C1 and C5 both on the left, and a very small 3mm anterior left falcine subdural hematoma.  Past Medical History:  Diagnosis Date   Abnormal drug screen 06/22/2014   inapprop neg xanax rpt 3 mo (06/2014)   BPH (benign prostatic hypertrophy) 01/21/1998   has had 3 biopsies in past ([facility]) decided to stop PSA/DRE   CHF (congestive heart failure) (HCC)    Coronary artery disease    Hyperlipidemia 01/21/2002   Hypertension 05/22/2003   Left lumbar radiculopathy    Osteoarthritis 01/22/1988   knees, lumbar spondylosis and listhesis    Past Surgical History:  Procedure Laterality Date   CATARACT EXTRACTION  2013   bilateral   CORONARY ANGIOGRAPHY N/A 03/29/2022   Procedure: CORONARY ANGIOGRAPHY;  Surgeon: Alwyn Pea, MD;  Location: [facility];  Service: Cardiovascular;  Laterality: N/A;   CORONARY STENT INTERVENTION N/A 03/27/2022   Procedure: CORONARY STENT INTERVENTION;  Surgeon: Alwyn Pea, MD;  Location: [facility];  Service: Cardiovascular;  Laterality: N/A;   CORONARY STENT INTERVENTION N/A 03/29/2022   Procedure: CORONARY STENT INTERVENTION;  Surgeon: Alwyn Pea, MD;  Location: [facility];  Service: Cardiovascular;  Laterality: N/A;   ESI Left 08/2014, 11/2014, 08/2015   L S1, L L5/S1 transforaminal ESI; L4/5 L5/S1 zygapophysial injections, L S1 transforaminal ([facility])   FINGER SURGERY     right middle, partial traumatic amputation   IR CATHETER TUBE CHANGE  03/02/2021   KNEE ARTHROSCOPY  1990   right   KNEE SURGERY  04/2006   right partial knee replacement in [place] - rec ppx abx for any invasive procedure   LEFT HEART CATH AND CORONARY ANGIOGRAPHY N/A  01/18/2021   Procedure: LEFT HEART CATH AND CORONARY ANGIOGRAPHY;  Surgeon: Lamar Blinks, MD;  Location: [facility];  Service: Cardiovascular;  Laterality: N/A;   LEFT HEART CATH AND CORONARY ANGIOGRAPHY N/A 03/27/2022   Procedure: LEFT HEART CATH AND CORONARY ANGIOGRAPHY;  Surgeon: Alwyn Pea, MD;  Location: [facility];  Service: Cardiovascular;  Laterality: N/A;   LEFT HEART CATH AND CORONARY ANGIOGRAPHY N/A 12/11/2022   Procedure: LEFT HEART CATH AND CORONARY ANGIOGRAPHY;  Surgeon: Marcina Millard, MD;  Location: [facility];  Service: Cardiovascular;  Laterality: N/A;    Family History  Problem Relation Age of Onset   Hypertension Mother    Heart disease Brother 58       MI   Diabetes Brother    Cancer Daughter        melanoma   Stroke Neg Hx     Social History:  reports that he quit smoking about 22 years ago. His smoking use included pipe and cigarettes. He has been exposed to tobacco smoke. His smokeless tobacco use includes chew. He reports current alcohol use. He reports that he does not use drugs.  Allergies:  Allergies  Allergen Reactions   Penicillins Swelling    Of tongue Has patient had a PCN reaction causing immediate rash, facial/tongue/throat swelling, SOB or lightheadedness with hypotension: Yes Has patient had a PCN reaction causing severe rash involving mucus membranes or skin necrosis: No Has patient had a PCN reaction that required hospitalization: No  Has patient had a PCN reaction occurring within the last 10 years: No If all of the above answers are "NO", then may proceed with Cephalosporin use.   Cialis [Tadalafil] Other (See Comments)    Indigestion   Lipitor [Atorvastatin] Other (See Comments)    Severe Myalgias   Lisinopril Cough    Medications: I have reviewed the patient's current medications.  Results for orders placed or performed during the hospital encounter of 04/02/23 (from the past 48 hours)   Comprehensive metabolic panel     Status: Abnormal   Collection Time: 04/02/23 11:20 AM  Result Value Ref Range   Sodium 139 135 - 145 mmol/L   Potassium 4.5 3.5 - 5.1 mmol/L   Chloride 108 98 - 111 mmol/L   CO2 22 22 - 32 mmol/L   Glucose, Bld 130 (H) 70 - 99 mg/dL    Comment: Glucose reference range applies only to samples taken after fasting for at least 8 hours.   BUN 28 (H) 8 - 23 mg/dL   Creatinine, Ser 9.56 (H) 0.61 - 1.24 mg/dL   Calcium 8.3 (L) 8.9 - 10.3 mg/dL   Total Protein 5.9 (L) 6.5 - 8.1 g/dL   Albumin 3.6 3.5 - 5.0 g/dL   AST 21 15 - 41 U/L   ALT 15 0 - 44 U/L   Alkaline Phosphatase 49 38 - 126 U/L   Total Bilirubin 0.3 0.0 - 1.2 mg/dL   GFR, Estimated 34 (L) >60 mL/min    Comment: (NOTE) Calculated using the CKD-EPI Creatinine Equation (2021)    Anion gap 9 5 - 15    Comment: Performed at [facility], [address] [place], [place] [address]  CBC     Status: Abnormal   Collection Time: 04/02/23 11:20 AM  Result Value Ref Range   WBC 7.2 4.0 - 10.5 K/uL   RBC 3.64 (L) 4.22 - 5.81 MIL/uL   Hemoglobin 10.9 (L) 13.0 - 17.0 g/dL   HCT 43.3 (L) 29.5 - 18.8 %   MCV 88.7 80.0 - 100.0 fL   MCH 29.9 26.0 - 34.0 pg   MCHC 33.7 30.0 - 36.0 g/dL   RDW 41.6 60.6 - 30.1 %   Platelets 178 150 - 400 K/uL   nRBC 0.0 0.0 - 0.2 %    Comment: Performed at [facility], [address], [place], [place] [address]  Ethanol     Status: None   Collection Time: 04/02/23 11:20 AM  Result Value Ref Range   Alcohol, Ethyl (B) <10 <10 mg/dL    Comment: (NOTE) Lowest detectable limit for serum alcohol is 10 mg/dL.  For medical purposes only. Performed at [facility], [address] [place], [place] [address]   Urinalysis, Routine w reflex microscopic -Urine, Clean Catch     Status: Abnormal   Collection Time: 04/02/23 11:20 AM  Result Value Ref Range   Color, Urine YELLOW YELLOW   APPearance TURBID (A) CLEAR   Specific Gravity, Urine 1.021 1.005 -  1.030   pH 7.0 5.0 - 8.0   Glucose, UA NEGATIVE NEGATIVE mg/dL   Hgb urine dipstick NEGATIVE NEGATIVE   Bilirubin Urine NEGATIVE NEGATIVE   Ketones, ur NEGATIVE NEGATIVE mg/dL   Protein, ur 732 (A) NEGATIVE mg/dL   Nitrite NEGATIVE NEGATIVE   Leukocytes,Ua LARGE (A) NEGATIVE   RBC / HPF 6-10 0 - 5 RBC/hpf   WBC, UA >50 0 - 5 WBC/hpf   Bacteria, UA RARE (  A) NONE SEEN   Squamous Epithelial / HPF 0-5 0 - 5 /HPF   Mucus PRESENT    Triple Phosphate Crystal PRESENT     Comment: Performed at [facility], [address] [place], [place] [address]  Protime-INR     Status: Abnormal   Collection Time: 04/02/23 11:20 AM  Result Value Ref Range   Prothrombin Time 15.4 (H) 11.4 - 15.2 seconds   INR 1.2 0.8 - 1.2    Comment: (NOTE) INR goal varies based on device and disease states. Performed at [facility], [address], [place], [place] [address]   Sample to Blood Bank     Status: None   Collection Time: 04/02/23 11:20 AM  Result Value Ref Range   Blood Bank Specimen SAMPLE AVAILABLE FOR TESTING    Sample Expiration      04/05/2023,2359 Performed at [facility], [address], [place], [place] [address]   I-Stat Chem 8, ED     Status: Abnormal   Collection Time: 04/02/23 11:30 AM  Result Value Ref Range   Sodium 137 135 - 145 mmol/L   Potassium 4.5 3.5 - 5.1 mmol/L   Chloride 108 98 - 111 mmol/L   BUN 29 (H) 8 - 23 mg/dL   Creatinine, Ser 9.14 (H) 0.61 - 1.24 mg/dL   Glucose, Bld 782 (H) 70 - 99 mg/dL    Comment: Glucose reference range applies only to samples taken after fasting for at least 8 hours.   Calcium, Ion 1.02 (L) 1.15 - 1.40 mmol/L   TCO2 21 (L) 22 - 32 mmol/L   Hemoglobin 9.5 (L) 13.0 - 17.0 g/dL   HCT 95.6 (L) 21.3 - 08.6 %  I-Stat Lactic Acid, ED     Status: Abnormal   Collection Time: 04/02/23 11:30 AM  Result Value Ref Range   Lactic Acid, Venous 2.0 (HH) 0.5 - 1.9 mmol/L    CT HEAD WO CONTRAST Result Date: 04/02/2023 CLINICAL DATA:   Provided history: Facial trauma, blunt. Additional history provided: Fall (hitting ground and branches). On blood thinner. Right forehead swelling. Right eye swelling. Upper lip laceration. EXAM: CT HEAD WITHOUT CONTRAST CT MAXILLOFACIAL WITHOUT CONTRAST CT CERVICAL SPINE WITHOUT CONTRAST TECHNIQUE: Multidetector CT imaging of the head, cervical spine, and maxillofacial structures were performed using the standard protocol without intravenous contrast. Multiplanar CT image reconstructions of the cervical spine and maxillofacial structures were also generated. RADIATION DOSE REDUCTION: This exam was performed according to the departmental dose-optimization program which includes automated exposure control, adjustment of the mA and/or kV according to patient size and/or use of iterative reconstruction technique. COMPARISON:  Head CT 08/09/2022. FINDINGS: CT HEAD FINDINGS Brain: Generalized cerebral atrophy. Acute subdural hematoma along the left aspect of the falx measuring up to 3 mm in thickness (for instance as seen on series 3, image 25). No demarcated cortical infarct. No evidence of an intracranial mass. No midline shift. Vascular: No hyperdense vessel.  Atherosclerotic calcifications. Skull: No calvarial fracture or aggressive osseous lesion. Other: Large right anterior scalp, forehead and periorbital hematoma. Forehead laceration. CT MAXILLOFACIAL FINDINGS Osseous: No acute maxillofacial fracture is identified. Orbits: Large right periorbital hematoma. No acute finding within the orbits. Sinuses: Moderate polypoid mucosal thickening within the right maxillary sinus. Moderate mucosal thickening within the left maxillary sinus. Minimal mucosal thickening within bilateral ethmoid air cells. Soft tissues: Large right anterior scalp, forehead and periorbital hematoma. Forehead laceration. CT CERVICAL SPINE FINDINGS Alignment: Slight C4-C5 grade 1 anterolisthesis.  Slight C5-C6 grade 1 retrolisthesis. Slight T2-T3  grade 1 anterolisthesis. Skull base and vertebrae: The basion-dental and atlanto-dental intervals are maintained. Acute displaced fracture of the left C1 transverse process (lateral to the foramen transversarium) (for instance as seen on series 9, image 66) (series 4, image 30). Mildly displaced acute fracture through the left C5 foramen transversarium (series 10, images 50-51). Acute, displaced fracture of the C5 spinous process (series 10, image 57). No other acute cervical spine fracture is identified. Soft tissues and spinal canal: No prevertebral fluid or swelling. No visible canal hematoma. Disc levels: Cervical spondylosis with multilevel disc space narrowing, disc bulges/central disc protrusions, uncovertebral hypertrophy and facet arthropathy. Disc space narrowing is greatest at C4-C5 and C5-C6 (moderate to advanced at these levels). Multilevel spinal canal narrowing. Most notably at C5-C6, a central disc protrusion contributes to at least moderate spinal canal stenosis. Multilevel bony neural foraminal narrowing. Upper chest: No consolidation within the imaged lung apices. No visible pneumothorax. Acute findings called by telephone at the time of interpretation on 04/02/2023 at 1:49 pm to provider JULIE HAVILAND , who verbally acknowledged these results. IMPRESSION: CT head: 1. Acute subdural hematoma along the left aspect of the falx, measuring up to 3 mm in thickness. 2. Large right anterior scalp, forehead and periorbital hematoma. 3. Forehead laceration. 4. Cerebral atrophy. CT maxillofacial: 1. No evidence of an acute maxillofacial fracture. 2. Large right anterior scalp, forehead and periorbital hematoma. 3. Forehead laceration. 4. Paranasal sinus disease as described. CT cervical spine: 1. Acute displaced fracture of the left C1 transverse process (lateral to the foramen transversarium). 2. Acute mildly displaced fracture through the left C5 foramen transversarium. A CTA of the neck is recommended to  exclude traumatic injury to the left vertebral artery. 3. Acute, displaced fracture of the C5 spinous process. 4. Mild grade 1 spondylolisthesis at C4-C5, C5-C6 and T2-T3. 5. Cervical spondylosis as described within the body of the report. Spinal canal stenosis is greatest at C5-C6 (at least moderate at this level). Multilevel bony neural foraminal narrowing. Electronically Signed   By: Jackey Loge D.O.   On: 04/02/2023 13:51   CT MAXILLOFACIAL WO CONTRAST Result Date: 04/02/2023 CLINICAL DATA:  Provided history: Facial trauma, blunt. Additional history provided: Fall (hitting ground and branches). On blood thinner. Right forehead swelling. Right eye swelling. Upper lip laceration. EXAM: CT HEAD WITHOUT CONTRAST CT MAXILLOFACIAL WITHOUT CONTRAST CT CERVICAL SPINE WITHOUT CONTRAST TECHNIQUE: Multidetector CT imaging of the head, cervical spine, and maxillofacial structures were performed using the standard protocol without intravenous contrast. Multiplanar CT image reconstructions of the cervical spine and maxillofacial structures were also generated. RADIATION DOSE REDUCTION: This exam was performed according to the departmental dose-optimization program which includes automated exposure control, adjustment of the mA and/or kV according to patient size and/or use of iterative reconstruction technique. COMPARISON:  Head CT 08/09/2022. FINDINGS: CT HEAD FINDINGS Brain: Generalized cerebral atrophy. Acute subdural hematoma along the left aspect of the falx measuring up to 3 mm in thickness (for instance as seen on series 3, image 25). No demarcated cortical infarct. No evidence of an intracranial mass. No midline shift. Vascular: No hyperdense vessel.  Atherosclerotic calcifications. Skull: No calvarial fracture or aggressive osseous lesion. Other: Large right anterior scalp, forehead and periorbital hematoma. Forehead laceration. CT MAXILLOFACIAL FINDINGS Osseous: No acute maxillofacial fracture is identified.  Orbits: Large right periorbital hematoma. No acute finding within the orbits. Sinuses: Moderate polypoid mucosal thickening within the right maxillary sinus. Moderate mucosal thickening within the left  maxillary sinus. Minimal mucosal thickening within bilateral ethmoid air cells. Soft tissues: Large right anterior scalp, forehead and periorbital hematoma. Forehead laceration. CT CERVICAL SPINE FINDINGS Alignment: Slight C4-C5 grade 1 anterolisthesis. Slight C5-C6 grade 1 retrolisthesis. Slight T2-T3 grade 1 anterolisthesis. Skull base and vertebrae: The basion-dental and atlanto-dental intervals are maintained. Acute displaced fracture of the left C1 transverse process (lateral to the foramen transversarium) (for instance as seen on series 9, image 66) (series 4, image 30). Mildly displaced acute fracture through the left C5 foramen transversarium (series 10, images 50-51). Acute, displaced fracture of the C5 spinous process (series 10, image 57). No other acute cervical spine fracture is identified. Soft tissues and spinal canal: No prevertebral fluid or swelling. No visible canal hematoma. Disc levels: Cervical spondylosis with multilevel disc space narrowing, disc bulges/central disc protrusions, uncovertebral hypertrophy and facet arthropathy. Disc space narrowing is greatest at C4-C5 and C5-C6 (moderate to advanced at these levels). Multilevel spinal canal narrowing. Most notably at C5-C6, a central disc protrusion contributes to at least moderate spinal canal stenosis. Multilevel bony neural foraminal narrowing. Upper chest: No consolidation within the imaged lung apices. No visible pneumothorax. Acute findings called by telephone at the time of interpretation on 04/02/2023 at 1:49 pm to provider JULIE HAVILAND , who verbally acknowledged these results. IMPRESSION: CT head: 1. Acute subdural hematoma along the left aspect of the falx, measuring up to 3 mm in thickness. 2. Large right anterior scalp, forehead  and periorbital hematoma. 3. Forehead laceration. 4. Cerebral atrophy. CT maxillofacial: 1. No evidence of an acute maxillofacial fracture. 2. Large right anterior scalp, forehead and periorbital hematoma. 3. Forehead laceration. 4. Paranasal sinus disease as described. CT cervical spine: 1. Acute displaced fracture of the left C1 transverse process (lateral to the foramen transversarium). 2. Acute mildly displaced fracture through the left C5 foramen transversarium. A CTA of the neck is recommended to exclude traumatic injury to the left vertebral artery. 3. Acute, displaced fracture of the C5 spinous process. 4. Mild grade 1 spondylolisthesis at C4-C5, C5-C6 and T2-T3. 5. Cervical spondylosis as described within the body of the report. Spinal canal stenosis is greatest at C5-C6 (at least moderate at this level). Multilevel bony neural foraminal narrowing. Electronically Signed   By: Jackey Loge D.O.   On: 04/02/2023 13:51   CT CERVICAL SPINE WO CONTRAST Result Date: 04/02/2023 CLINICAL DATA:  Provided history: Facial trauma, blunt. Additional history provided: Fall (hitting ground and branches). On blood thinner. Right forehead swelling. Right eye swelling. Upper lip laceration. EXAM: CT HEAD WITHOUT CONTRAST CT MAXILLOFACIAL WITHOUT CONTRAST CT CERVICAL SPINE WITHOUT CONTRAST TECHNIQUE: Multidetector CT imaging of the head, cervical spine, and maxillofacial structures were performed using the standard protocol without intravenous contrast. Multiplanar CT image reconstructions of the cervical spine and maxillofacial structures were also generated. RADIATION DOSE REDUCTION: This exam was performed according to the departmental dose-optimization program which includes automated exposure control, adjustment of the mA and/or kV according to patient size and/or use of iterative reconstruction technique. COMPARISON:  Head CT 08/09/2022. FINDINGS: CT HEAD FINDINGS Brain: Generalized cerebral atrophy. Acute subdural  hematoma along the left aspect of the falx measuring up to 3 mm in thickness (for instance as seen on series 3, image 25). No demarcated cortical infarct. No evidence of an intracranial mass. No midline shift. Vascular: No hyperdense vessel.  Atherosclerotic calcifications. Skull: No calvarial fracture or aggressive osseous lesion. Other: Large right anterior scalp, forehead and periorbital hematoma. Forehead laceration. CT MAXILLOFACIAL FINDINGS Osseous: No  acute maxillofacial fracture is identified. Orbits: Large right periorbital hematoma. No acute finding within the orbits. Sinuses: Moderate polypoid mucosal thickening within the right maxillary sinus. Moderate mucosal thickening within the left maxillary sinus. Minimal mucosal thickening within bilateral ethmoid air cells. Soft tissues: Large right anterior scalp, forehead and periorbital hematoma. Forehead laceration. CT CERVICAL SPINE FINDINGS Alignment: Slight C4-C5 grade 1 anterolisthesis. Slight C5-C6 grade 1 retrolisthesis. Slight T2-T3 grade 1 anterolisthesis. Skull base and vertebrae: The basion-dental and atlanto-dental intervals are maintained. Acute displaced fracture of the left C1 transverse process (lateral to the foramen transversarium) (for instance as seen on series 9, image 66) (series 4, image 30). Mildly displaced acute fracture through the left C5 foramen transversarium (series 10, images 50-51). Acute, displaced fracture of the C5 spinous process (series 10, image 57). No other acute cervical spine fracture is identified. Soft tissues and spinal canal: No prevertebral fluid or swelling. No visible canal hematoma. Disc levels: Cervical spondylosis with multilevel disc space narrowing, disc bulges/central disc protrusions, uncovertebral hypertrophy and facet arthropathy. Disc space narrowing is greatest at C4-C5 and C5-C6 (moderate to advanced at these levels). Multilevel spinal canal narrowing. Most notably at C5-C6, a central disc  protrusion contributes to at least moderate spinal canal stenosis. Multilevel bony neural foraminal narrowing. Upper chest: No consolidation within the imaged lung apices. No visible pneumothorax. Acute findings called by telephone at the time of interpretation on 04/02/2023 at 1:49 pm to provider JULIE HAVILAND , who verbally acknowledged these results. IMPRESSION: CT head: 1. Acute subdural hematoma along the left aspect of the falx, measuring up to 3 mm in thickness. 2. Large right anterior scalp, forehead and periorbital hematoma. 3. Forehead laceration. 4. Cerebral atrophy. CT maxillofacial: 1. No evidence of an acute maxillofacial fracture. 2. Large right anterior scalp, forehead and periorbital hematoma. 3. Forehead laceration. 4. Paranasal sinus disease as described. CT cervical spine: 1. Acute displaced fracture of the left C1 transverse process (lateral to the foramen transversarium). 2. Acute mildly displaced fracture through the left C5 foramen transversarium. A CTA of the neck is recommended to exclude traumatic injury to the left vertebral artery. 3. Acute, displaced fracture of the C5 spinous process. 4. Mild grade 1 spondylolisthesis at C4-C5, C5-C6 and T2-T3. 5. Cervical spondylosis as described within the body of the report. Spinal canal stenosis is greatest at C5-C6 (at least moderate at this level). Multilevel bony neural foraminal narrowing. Electronically Signed   By: Jackey Loge D.O.   On: 04/02/2023 13:51   DG Pelvis Portable Result Date: 04/02/2023 CLINICAL DATA:  Trauma.  Fall. EXAM: PORTABLE PELVIS 1-2 VIEWS COMPARISON:  None Available. FINDINGS: Pelvis is intact with normal and symmetric sacroiliac joints. No acute fracture or dislocation. No aggressive osseous lesion. Visualized sacral arcuate lines are unremarkable. Unremarkable symphysis pubis. There are mild degenerative changes of bilateral hip joints without significant joint space narrowing. Osteophytosis of the superior  acetabulum. No radiopaque foreign bodies. IMPRESSION: No acute osseous abnormality of the pelvis. Electronically Signed   By: Jules Schick M.D.   On: 04/02/2023 13:23   DG Chest Port 1 View Result Date: 04/02/2023 CLINICAL DATA:  Trauma. EXAM: PORTABLE CHEST 1 VIEW COMPARISON:  12/09/2022 FINDINGS: Low lung volume. Bilateral lung fields are clear. Bilateral costophrenic angles are clear. Normal cardio-mediastinal silhouette. No acute osseous abnormalities. No acute displaced rib fracture seen. The soft tissues are within normal limits. IMPRESSION: No active disease. Electronically Signed   By: Jules Schick M.D.   On: 04/02/2023 13:22  Review of Systems  Constitutional: Negative.   HENT: Negative.    Eyes: Negative.   Respiratory: Negative.    Cardiovascular: Negative.   Gastrointestinal: Negative.    Blood pressure (!) 107/54, pulse 77, temperature 97.6 F (36.4 C), resp. rate 20, height 5\' 10"  (1.778 m), weight 89.4 kg, SpO2 97%. Physical Exam HENT:     Head:     Comments: Multiple facial abrasions, head laceration on right    Right Ear: External ear normal.     Left Ear: External ear normal.     Mouth/Throat:     Mouth: Mucous membranes are moist.     Comments: Oropharynx with blood Eyes:     Extraocular Movements: Extraocular movements intact.     Pupils: Pupils are equal, round, and reactive to light.  Neck:     Comments: In Cervical collar Cardiovascular:     Rate and Rhythm: Normal rate.  Pulmonary:     Effort: Pulmonary effort is normal.  Abdominal:     General: Abdomen is flat.     Palpations: Abdomen is soft.  Musculoskeletal:        General: Normal range of motion.  Skin:    General: Skin is warm.     Findings: Bruising present.  Neurological:     General: No focal deficit present.     Mental Status: He is alert and oriented to person, place, and time.     Cranial Nerves: Cranial nerve deficit present.     Sensory: No sensory deficit.     Motor: Motor  function is intact.     Coordination: Coordination is intact.     Comments: Decreased hearing to voice. Moving all extremities   Psychiatric:        Mood and Affect: Mood normal.        Behavior: Behavior normal.        Thought Content: Thought content normal.        Judgment: Judgment normal.     Assessment/Plan: Shawn Andrews is a 89 y.o. male Whom after falling today sustained a head laceration and multiple facial abrasions. The falcine subdural is at best 3mm if is indeed real. Recommend repeat head CT this evening. Do not believe plavix has to be stopped at this time. The cardiac stent by report is fairly recent. Will follow  Coletta Memos 04/02/2023, 2:53 PM

## 2023-04-02 NOTE — ED Notes (Signed)
 Pt has suprapubic catheter intact with leg bag on left thigh

## 2023-04-02 NOTE — ED Triage Notes (Addendum)
 Pt bib ems from home c/o fall. Pt was in his yard when he fell down a 10 ft embankment hitting the ground and branches. Pt denies LOC. Pt is on a blood thinner medication. Pt has swelling to the upper right forehead, right eye swollen shut with visible hematoma, laceration to upper right lip, right forearm skin tear, left elbow skin tear, and left shin skin tear.   BP 159/76 HR 71 RA 99% GCS 15

## 2023-04-02 NOTE — Progress Notes (Signed)
 Trauma Event Note    Attempted to obtain secondary IV for blood transfusion without success. Reportedly has needed ultrasound use for venous access in the past. Requiring more oxygen now that he has arrived to unit, on 5L [place]. Unable to use CPAP with nasal fractures. BS very diminished, clear, snoring. Mental status remains consistent with admission exam, alert x4 and following commands.   Last imported Vital Signs BP 93/77   Pulse 83   Temp (!) 97.2 F (36.2 C) (Axillary)   Resp (!) 40   Ht 5\' 10"  (1.778 m)   Wt 197 lb (89.4 kg)   SpO2 98%   BMI 28.27 kg/m   Trending CBC Recent Labs    04/02/23 1120 04/02/23 1130 04/02/23 1910  WBC 7.2  --  16.3*  HGB 10.9* 9.5* 7.5*  HCT 32.3* 28.0* 22.5*  PLT 178  --  259    Trending Coag's Recent Labs    04/02/23 1120  INR 1.2    Trending BMET Recent Labs    04/02/23 1120 04/02/23 1130  NA 139 137  K 4.5 4.5  CL 108 108  CO2 22  --   BUN 28* 29*  CREATININE 1.85* 2.00*  GLUCOSE 130* 120*      Kiwan [place] O Tatjana Turcott  Trauma Response RN  Please call TRN at [phone] for further assistance.

## 2023-04-02 NOTE — Consult Note (Signed)
 Shawn Andrews [birth year]  [id number].    Requesting MD: Dr. Jacalyn Lefevre Chief Complaint/Reason for Consult: Fall down embankment  HPI:  This is a 89 yo white male who has a history of CAD, s/p stent placement in 2024 on ASA and plavix, CHF, BPH requiring a SP tube placement, HTN, CKD, and HLD who was walking in his yard and fell down a 36ft embankment and hit many bushes/shrubs on the way day.  He has multiple abrasion to his face as well as a scalp laceration and hematoma.  He currently complains of a HA and neck pain with some pain in his fingers.  He has no other complaints from his neck down.  He is in a c-collar.  He states he crawled after he fell but was not ambulatory.  He is "coughing up" bright red blood in the trauma bay.  He underwent trauma work up with evidence of a small SDH, along with several c-spine fxs.  CTA of the neck is pending.  CT CAP with a small pleural effusion, but no other acute injuries have been identified.  We have been asked to see.  ROS: ROS: see HPI  Family History  Problem Relation Age of Onset   Hypertension Mother    Heart disease Brother 102       MI   Diabetes Brother    Cancer Daughter        melanoma   Stroke Neg Hx     Past Medical History:  Diagnosis Date   Abnormal drug screen 06/22/2014   inapprop neg xanax rpt 3 mo (06/2014)   BPH (benign prostatic hypertrophy) 01/21/1998   has had 3 biopsies in past ([facility]) decided to stop PSA/DRE   CHF (congestive heart failure) (HCC)    Coronary artery disease    Hyperlipidemia 01/21/2002   Hypertension 05/22/2003   Left lumbar radiculopathy    Osteoarthritis 01/22/1988   knees, lumbar spondylosis and listhesis    Past Surgical History:  Procedure Laterality Date   CATARACT EXTRACTION  2013   bilateral   CORONARY ANGIOGRAPHY N/A 03/29/2022   Procedure: CORONARY ANGIOGRAPHY;  Surgeon: Alwyn Pea, MD;  Location: [facility];  Service: Cardiovascular;  Laterality: N/A;    CORONARY STENT INTERVENTION N/A 03/27/2022   Procedure: CORONARY STENT INTERVENTION;  Surgeon: Alwyn Pea, MD;  Location: [facility];  Service: Cardiovascular;  Laterality: N/A;   CORONARY STENT INTERVENTION N/A 03/29/2022   Procedure: CORONARY STENT INTERVENTION;  Surgeon: Alwyn Pea, MD;  Location: [facility];  Service: Cardiovascular;  Laterality: N/A;   ESI Left 08/2014, 11/2014, 08/2015   L S1, L L5/S1 transforaminal ESI; L4/5 L5/S1 zygapophysial injections, L S1 transforaminal ([facility])   FINGER SURGERY     right middle, partial traumatic amputation   IR CATHETER TUBE CHANGE  03/02/2021   KNEE ARTHROSCOPY  1990   right   KNEE SURGERY  04/2006   right partial knee replacement in [place] - rec ppx abx for any invasive procedure   LEFT HEART CATH AND CORONARY ANGIOGRAPHY N/A 01/18/2021   Procedure: LEFT HEART CATH AND CORONARY ANGIOGRAPHY;  Surgeon: Lamar Blinks, MD;  Location: [facility];  Service: Cardiovascular;  Laterality: N/A;   LEFT HEART CATH AND CORONARY ANGIOGRAPHY N/A 03/27/2022   Procedure: LEFT HEART CATH AND CORONARY ANGIOGRAPHY;  Surgeon: Alwyn Pea, MD;  Location: [facility];  Service: Cardiovascular;  Laterality: N/A;   LEFT  HEART CATH AND CORONARY ANGIOGRAPHY N/A 12/11/2022   Procedure: LEFT HEART CATH AND CORONARY ANGIOGRAPHY;  Surgeon: Marcina Millard, MD;  Location: [facility];  Service: Cardiovascular;  Laterality: N/A;    Social History:  reports that he quit smoking about 22 years ago. His smoking use included pipe and cigarettes. He has been exposed to tobacco smoke. His smokeless tobacco use includes chew. He reports current alcohol use. He reports that he does not use drugs.  Allergies:  Allergies  Allergen Reactions   Penicillins Swelling    Of tongue Has patient had a PCN reaction causing immediate rash, facial/tongue/throat swelling, SOB or lightheadedness with hypotension:  Yes Has patient had a PCN reaction causing severe rash involving mucus membranes or skin necrosis: No Has patient had a PCN reaction that required hospitalization: No Has patient had a PCN reaction occurring within the last 10 years: No If all of the above answers are "NO", then may proceed with Cephalosporin use.   Cialis [Tadalafil] Other (See Comments)    Indigestion   Lipitor [Atorvastatin] Other (See Comments)    Severe Myalgias   Lisinopril Cough    (Not in a hospital admission)    Physical Exam: Blood pressure (!) 107/54, pulse 77, temperature 97.6 F (36.4 C), resp. rate 20, height 5\' 10"  (1.778 m), weight 89.4 kg, SpO2 97%. General: pleasant, WD, WN, dlerly white male who is laying in bed in NAD HEENT: head is normocephalic, but with moderate right forehead hematoma and laceration (being repair by EDP currently).  Abrasion to the bridge of his nose and some abrasions to his lips.  BRB in his mouth, but no lacerations or obvious source seen in oropharynx space.  No epistaxis noted either.  Sclera are noninjected on L.  Right eye is swollen shut secondary to periorbital ecchymosis and edema from scalp hematoma.  PERRL.  Mouth is pink and moist.  Top dentures removed.  Dental cary noted in low left posterior molar.  Neck with c-collar in place.  Trachea midline Heart: regular, rate, and rhythm.  Normal s1,s2. No obvious murmurs, gallops, or rubs noted.  Palpable radial and pedal pulses bilaterally Lungs: rhonchi noted bilaterally, R>L, suspect this is from blood run down.  Respiratory effort nonlabored on RA.   Abd: soft, NT, ND, +BS, no masses, hernias, or organomegaly.  SP tube noted in lower central pelvic attached to leg bag with clear urine. MS: all 4 extremities are symmetrical with no cyanosis, clubbing, or edema.  Missing part of his right 2nd or 3rd finger from previous amputation. Some ecchymosis on both of his knees, but nontender and normal ROM.  Normal ROM of all  extremities.  Skin: warm and dry with no masses, lesions, or rashes Neuro: Cranial nerves 2-12 grossly intact, sensation is normal throughout Psych: A&Ox3 with an appropriate affect.   Results for orders placed or performed during the hospital encounter of 04/02/23 (from the past 48 hours)  Comprehensive metabolic panel     Status: Abnormal   Collection Time: 04/02/23 11:20 AM  Result Value Ref Range   Sodium 139 135 - 145 mmol/L   Potassium 4.5 3.5 - 5.1 mmol/L   Chloride 108 98 - 111 mmol/L   CO2 22 22 - 32 mmol/L   Glucose, Bld 130 (H) 70 - 99 mg/dL    Comment: Glucose reference range applies only to samples taken after fasting for at least 8 hours.   BUN 28 (H) 8 - 23 mg/dL   Creatinine,  Ser 1.85 (H) 0.61 - 1.24 mg/dL   Calcium 8.3 (L) 8.9 - 10.3 mg/dL   Total Protein 5.9 (L) 6.5 - 8.1 g/dL   Albumin 3.6 3.5 - 5.0 g/dL   AST 21 15 - 41 U/L   ALT 15 0 - 44 U/L   Alkaline Phosphatase 49 38 - 126 U/L   Total Bilirubin 0.3 0.0 - 1.2 mg/dL   GFR, Estimated 34 (L) >60 mL/min    Comment: (NOTE) Calculated using the CKD-EPI Creatinine Equation (2021)    Anion gap 9 5 - 15    Comment: Performed at [facility], [address] [place], [place] [address]  CBC     Status: Abnormal   Collection Time: 04/02/23 11:20 AM  Result Value Ref Range   WBC 7.2 4.0 - 10.5 K/uL   RBC 3.64 (L) 4.22 - 5.81 MIL/uL   Hemoglobin 10.9 (L) 13.0 - 17.0 g/dL   HCT 95.6 (L) 21.3 - 08.6 %   MCV 88.7 80.0 - 100.0 fL   MCH 29.9 26.0 - 34.0 pg   MCHC 33.7 30.0 - 36.0 g/dL   RDW 57.8 46.9 - 62.9 %   Platelets 178 150 - 400 K/uL   nRBC 0.0 0.0 - 0.2 %    Comment: Performed at [facility], [address], [place], [place] [address]  Ethanol     Status: None   Collection Time: 04/02/23 11:20 AM  Result Value Ref Range   Alcohol, Ethyl (B) <10 <10 mg/dL    Comment: (NOTE) Lowest detectable limit for serum alcohol is 10 mg/dL.  For medical purposes only. Performed at [facility]  Lab, [address], [place], [place] [address]   Urinalysis, Routine w reflex microscopic -Urine, Clean Catch     Status: Abnormal   Collection Time: 04/02/23 11:20 AM  Result Value Ref Range   Color, Urine YELLOW YELLOW   APPearance TURBID (A) CLEAR   Specific Gravity, Urine 1.021 1.005 - 1.030   pH 7.0 5.0 - 8.0   Glucose, UA NEGATIVE NEGATIVE mg/dL   Hgb urine dipstick NEGATIVE NEGATIVE   Bilirubin Urine NEGATIVE NEGATIVE   Ketones, ur NEGATIVE NEGATIVE mg/dL   Protein, ur 102 (A) NEGATIVE mg/dL   Nitrite NEGATIVE NEGATIVE   Leukocytes,Ua LARGE (A) NEGATIVE   RBC / HPF 6-10 0 - 5 RBC/hpf   WBC, UA >50 0 - 5 WBC/hpf   Bacteria, UA RARE (A) NONE SEEN   Squamous Epithelial / HPF 0-5 0 - 5 /HPF   Mucus PRESENT    Triple Phosphate Crystal PRESENT     Comment: Performed at [facility], [address], [place], [place] [address]  Protime-INR     Status: Abnormal   Collection Time: 04/02/23 11:20 AM  Result Value Ref Range   Prothrombin Time 15.4 (H) 11.4 - 15.2 seconds   INR 1.2 0.8 - 1.2    Comment: (NOTE) INR goal varies based on device and disease states. Performed at [facility], [address] [place], [place] [address]   Sample to Blood Bank     Status: None   Collection Time: 04/02/23 11:20 AM  Result Value Ref Range   Blood Bank Specimen SAMPLE AVAILABLE FOR TESTING    Sample Expiration      04/05/2023,2359 Performed at [facility], [address] [place], [place] [address]   I-Stat Chem 8, ED     Status: Abnormal   Collection  Time: 04/02/23 11:30 AM  Result Value Ref Range   Sodium 137 135 - 145 mmol/L   Potassium 4.5 3.5 - 5.1 mmol/L   Chloride 108 98 - 111 mmol/L   BUN 29 (H) 8 - 23 mg/dL   Creatinine, Ser 0.98 (H) 0.61 - 1.24 mg/dL   Glucose, Bld 119 (H) 70 - 99 mg/dL    Comment: Glucose reference range applies only to samples taken after fasting for at least 8 hours.   Calcium, Ion 1.02 (L) 1.15 - 1.40 mmol/L   TCO2 21 (L) 22 - 32  mmol/L   Hemoglobin 9.5 (L) 13.0 - 17.0 g/dL   HCT 14.7 (L) 82.9 - 56.2 %  I-Stat Lactic Acid, ED     Status: Abnormal   Collection Time: 04/02/23 11:30 AM  Result Value Ref Range   Lactic Acid, Venous 2.0 (HH) 0.5 - 1.9 mmol/L   CT HEAD WO CONTRAST Result Date: 04/02/2023 CLINICAL DATA:  Provided history: Facial trauma, blunt. Additional history provided: Fall (hitting ground and branches). On blood thinner. Right forehead swelling. Right eye swelling. Upper lip laceration. EXAM: CT HEAD WITHOUT CONTRAST CT MAXILLOFACIAL WITHOUT CONTRAST CT CERVICAL SPINE WITHOUT CONTRAST TECHNIQUE: Multidetector CT imaging of the head, cervical spine, and maxillofacial structures were performed using the standard protocol without intravenous contrast. Multiplanar CT image reconstructions of the cervical spine and maxillofacial structures were also generated. RADIATION DOSE REDUCTION: This exam was performed according to the departmental dose-optimization program which includes automated exposure control, adjustment of the mA and/or kV according to patient size and/or use of iterative reconstruction technique. COMPARISON:  Head CT 08/09/2022. FINDINGS: CT HEAD FINDINGS Brain: Generalized cerebral atrophy. Acute subdural hematoma along the left aspect of the falx measuring up to 3 mm in thickness (for instance as seen on series 3, image 25). No demarcated cortical infarct. No evidence of an intracranial mass. No midline shift. Vascular: No hyperdense vessel.  Atherosclerotic calcifications. Skull: No calvarial fracture or aggressive osseous lesion. Other: Large right anterior scalp, forehead and periorbital hematoma. Forehead laceration. CT MAXILLOFACIAL FINDINGS Osseous: No acute maxillofacial fracture is identified. Orbits: Large right periorbital hematoma. No acute finding within the orbits. Sinuses: Moderate polypoid mucosal thickening within the right maxillary sinus. Moderate mucosal thickening within the left  maxillary sinus. Minimal mucosal thickening within bilateral ethmoid air cells. Soft tissues: Large right anterior scalp, forehead and periorbital hematoma. Forehead laceration. CT CERVICAL SPINE FINDINGS Alignment: Slight C4-C5 grade 1 anterolisthesis. Slight C5-C6 grade 1 retrolisthesis. Slight T2-T3 grade 1 anterolisthesis. Skull base and vertebrae: The basion-dental and atlanto-dental intervals are maintained. Acute displaced fracture of the left C1 transverse process (lateral to the foramen transversarium) (for instance as seen on series 9, image 66) (series 4, image 30). Mildly displaced acute fracture through the left C5 foramen transversarium (series 10, images 50-51). Acute, displaced fracture of the C5 spinous process (series 10, image 57). No other acute cervical spine fracture is identified. Soft tissues and spinal canal: No prevertebral fluid or swelling. No visible canal hematoma. Disc levels: Cervical spondylosis with multilevel disc space narrowing, disc bulges/central disc protrusions, uncovertebral hypertrophy and facet arthropathy. Disc space narrowing is greatest at C4-C5 and C5-C6 (moderate to advanced at these levels). Multilevel spinal canal narrowing. Most notably at C5-C6, a central disc protrusion contributes to at least moderate spinal canal stenosis. Multilevel bony neural foraminal narrowing. Upper chest: No consolidation within the imaged lung apices. No visible pneumothorax. Acute findings called by telephone at the time of interpretation on 04/02/2023 at  1:49 pm to provider JULIE HAVILAND , who verbally acknowledged these results. IMPRESSION: CT head: 1. Acute subdural hematoma along the left aspect of the falx, measuring up to 3 mm in thickness. 2. Large right anterior scalp, forehead and periorbital hematoma. 3. Forehead laceration. 4. Cerebral atrophy. CT maxillofacial: 1. No evidence of an acute maxillofacial fracture. 2. Large right anterior scalp, forehead and periorbital  hematoma. 3. Forehead laceration. 4. Paranasal sinus disease as described. CT cervical spine: 1. Acute displaced fracture of the left C1 transverse process (lateral to the foramen transversarium). 2. Acute mildly displaced fracture through the left C5 foramen transversarium. A CTA of the neck is recommended to exclude traumatic injury to the left vertebral artery. 3. Acute, displaced fracture of the C5 spinous process. 4. Mild grade 1 spondylolisthesis at C4-C5, C5-C6 and T2-T3. 5. Cervical spondylosis as described within the body of the report. Spinal canal stenosis is greatest at C5-C6 (at least moderate at this level). Multilevel bony neural foraminal narrowing. Electronically Signed   By: Jackey Loge D.O.   On: 04/02/2023 13:51   CT MAXILLOFACIAL WO CONTRAST Result Date: 04/02/2023 CLINICAL DATA:  Provided history: Facial trauma, blunt. Additional history provided: Fall (hitting ground and branches). On blood thinner. Right forehead swelling. Right eye swelling. Upper lip laceration. EXAM: CT HEAD WITHOUT CONTRAST CT MAXILLOFACIAL WITHOUT CONTRAST CT CERVICAL SPINE WITHOUT CONTRAST TECHNIQUE: Multidetector CT imaging of the head, cervical spine, and maxillofacial structures were performed using the standard protocol without intravenous contrast. Multiplanar CT image reconstructions of the cervical spine and maxillofacial structures were also generated. RADIATION DOSE REDUCTION: This exam was performed according to the departmental dose-optimization program which includes automated exposure control, adjustment of the mA and/or kV according to patient size and/or use of iterative reconstruction technique. COMPARISON:  Head CT 08/09/2022. FINDINGS: CT HEAD FINDINGS Brain: Generalized cerebral atrophy. Acute subdural hematoma along the left aspect of the falx measuring up to 3 mm in thickness (for instance as seen on series 3, image 25). No demarcated cortical infarct. No evidence of an intracranial mass. No  midline shift. Vascular: No hyperdense vessel.  Atherosclerotic calcifications. Skull: No calvarial fracture or aggressive osseous lesion. Other: Large right anterior scalp, forehead and periorbital hematoma. Forehead laceration. CT MAXILLOFACIAL FINDINGS Osseous: No acute maxillofacial fracture is identified. Orbits: Large right periorbital hematoma. No acute finding within the orbits. Sinuses: Moderate polypoid mucosal thickening within the right maxillary sinus. Moderate mucosal thickening within the left maxillary sinus. Minimal mucosal thickening within bilateral ethmoid air cells. Soft tissues: Large right anterior scalp, forehead and periorbital hematoma. Forehead laceration. CT CERVICAL SPINE FINDINGS Alignment: Slight C4-C5 grade 1 anterolisthesis. Slight C5-C6 grade 1 retrolisthesis. Slight T2-T3 grade 1 anterolisthesis. Skull base and vertebrae: The basion-dental and atlanto-dental intervals are maintained. Acute displaced fracture of the left C1 transverse process (lateral to the foramen transversarium) (for instance as seen on series 9, image 66) (series 4, image 30). Mildly displaced acute fracture through the left C5 foramen transversarium (series 10, images 50-51). Acute, displaced fracture of the C5 spinous process (series 10, image 57). No other acute cervical spine fracture is identified. Soft tissues and spinal canal: No prevertebral fluid or swelling. No visible canal hematoma. Disc levels: Cervical spondylosis with multilevel disc space narrowing, disc bulges/central disc protrusions, uncovertebral hypertrophy and facet arthropathy. Disc space narrowing is greatest at C4-C5 and C5-C6 (moderate to advanced at these levels). Multilevel spinal canal narrowing. Most notably at C5-C6, a central disc protrusion contributes to at least moderate spinal canal  stenosis. Multilevel bony neural foraminal narrowing. Upper chest: No consolidation within the imaged lung apices. No visible pneumothorax. Acute  findings called by telephone at the time of interpretation on 04/02/2023 at 1:49 pm to provider JULIE HAVILAND , who verbally acknowledged these results. IMPRESSION: CT head: 1. Acute subdural hematoma along the left aspect of the falx, measuring up to 3 mm in thickness. 2. Large right anterior scalp, forehead and periorbital hematoma. 3. Forehead laceration. 4. Cerebral atrophy. CT maxillofacial: 1. No evidence of an acute maxillofacial fracture. 2. Large right anterior scalp, forehead and periorbital hematoma. 3. Forehead laceration. 4. Paranasal sinus disease as described. CT cervical spine: 1. Acute displaced fracture of the left C1 transverse process (lateral to the foramen transversarium). 2. Acute mildly displaced fracture through the left C5 foramen transversarium. A CTA of the neck is recommended to exclude traumatic injury to the left vertebral artery. 3. Acute, displaced fracture of the C5 spinous process. 4. Mild grade 1 spondylolisthesis at C4-C5, C5-C6 and T2-T3. 5. Cervical spondylosis as described within the body of the report. Spinal canal stenosis is greatest at C5-C6 (at least moderate at this level). Multilevel bony neural foraminal narrowing. Electronically Signed   By: Jackey Loge D.O.   On: 04/02/2023 13:51   CT CERVICAL SPINE WO CONTRAST Result Date: 04/02/2023 CLINICAL DATA:  Provided history: Facial trauma, blunt. Additional history provided: Fall (hitting ground and branches). On blood thinner. Right forehead swelling. Right eye swelling. Upper lip laceration. EXAM: CT HEAD WITHOUT CONTRAST CT MAXILLOFACIAL WITHOUT CONTRAST CT CERVICAL SPINE WITHOUT CONTRAST TECHNIQUE: Multidetector CT imaging of the head, cervical spine, and maxillofacial structures were performed using the standard protocol without intravenous contrast. Multiplanar CT image reconstructions of the cervical spine and maxillofacial structures were also generated. RADIATION DOSE REDUCTION: This exam was performed  according to the departmental dose-optimization program which includes automated exposure control, adjustment of the mA and/or kV according to patient size and/or use of iterative reconstruction technique. COMPARISON:  Head CT 08/09/2022. FINDINGS: CT HEAD FINDINGS Brain: Generalized cerebral atrophy. Acute subdural hematoma along the left aspect of the falx measuring up to 3 mm in thickness (for instance as seen on series 3, image 25). No demarcated cortical infarct. No evidence of an intracranial mass. No midline shift. Vascular: No hyperdense vessel.  Atherosclerotic calcifications. Skull: No calvarial fracture or aggressive osseous lesion. Other: Large right anterior scalp, forehead and periorbital hematoma. Forehead laceration. CT MAXILLOFACIAL FINDINGS Osseous: No acute maxillofacial fracture is identified. Orbits: Large right periorbital hematoma. No acute finding within the orbits. Sinuses: Moderate polypoid mucosal thickening within the right maxillary sinus. Moderate mucosal thickening within the left maxillary sinus. Minimal mucosal thickening within bilateral ethmoid air cells. Soft tissues: Large right anterior scalp, forehead and periorbital hematoma. Forehead laceration. CT CERVICAL SPINE FINDINGS Alignment: Slight C4-C5 grade 1 anterolisthesis. Slight C5-C6 grade 1 retrolisthesis. Slight T2-T3 grade 1 anterolisthesis. Skull base and vertebrae: The basion-dental and atlanto-dental intervals are maintained. Acute displaced fracture of the left C1 transverse process (lateral to the foramen transversarium) (for instance as seen on series 9, image 66) (series 4, image 30). Mildly displaced acute fracture through the left C5 foramen transversarium (series 10, images 50-51). Acute, displaced fracture of the C5 spinous process (series 10, image 57). No other acute cervical spine fracture is identified. Soft tissues and spinal canal: No prevertebral fluid or swelling. No visible canal hematoma. Disc levels:  Cervical spondylosis with multilevel disc space narrowing, disc bulges/central disc protrusions, uncovertebral hypertrophy and facet arthropathy. Disc  space narrowing is greatest at C4-C5 and C5-C6 (moderate to advanced at these levels). Multilevel spinal canal narrowing. Most notably at C5-C6, a central disc protrusion contributes to at least moderate spinal canal stenosis. Multilevel bony neural foraminal narrowing. Upper chest: No consolidation within the imaged lung apices. No visible pneumothorax. Acute findings called by telephone at the time of interpretation on 04/02/2023 at 1:49 pm to provider JULIE HAVILAND , who verbally acknowledged these results. IMPRESSION: CT head: 1. Acute subdural hematoma along the left aspect of the falx, measuring up to 3 mm in thickness. 2. Large right anterior scalp, forehead and periorbital hematoma. 3. Forehead laceration. 4. Cerebral atrophy. CT maxillofacial: 1. No evidence of an acute maxillofacial fracture. 2. Large right anterior scalp, forehead and periorbital hematoma. 3. Forehead laceration. 4. Paranasal sinus disease as described. CT cervical spine: 1. Acute displaced fracture of the left C1 transverse process (lateral to the foramen transversarium). 2. Acute mildly displaced fracture through the left C5 foramen transversarium. A CTA of the neck is recommended to exclude traumatic injury to the left vertebral artery. 3. Acute, displaced fracture of the C5 spinous process. 4. Mild grade 1 spondylolisthesis at C4-C5, C5-C6 and T2-T3. 5. Cervical spondylosis as described within the body of the report. Spinal canal stenosis is greatest at C5-C6 (at least moderate at this level). Multilevel bony neural foraminal narrowing. Electronically Signed   By: Jackey Loge D.O.   On: 04/02/2023 13:51   DG Pelvis Portable Result Date: 04/02/2023 CLINICAL DATA:  Trauma.  Fall. EXAM: PORTABLE PELVIS 1-2 VIEWS COMPARISON:  None Available. FINDINGS: Pelvis is intact with normal and  symmetric sacroiliac joints. No acute fracture or dislocation. No aggressive osseous lesion. Visualized sacral arcuate lines are unremarkable. Unremarkable symphysis pubis. There are mild degenerative changes of bilateral hip joints without significant joint space narrowing. Osteophytosis of the superior acetabulum. No radiopaque foreign bodies. IMPRESSION: No acute osseous abnormality of the pelvis. Electronically Signed   By: Jules Schick M.D.   On: 04/02/2023 13:23   DG Chest Port 1 View Result Date: 04/02/2023 CLINICAL DATA:  Trauma. EXAM: PORTABLE CHEST 1 VIEW COMPARISON:  12/09/2022 FINDINGS: Low lung volume. Bilateral lung fields are clear. Bilateral costophrenic angles are clear. Normal cardio-mediastinal silhouette. No acute osseous abnormalities. No acute displaced rib fracture seen. The soft tissues are within normal limits. IMPRESSION: No active disease. Electronically Signed   By: Jules Schick M.D.   On: 04/02/2023 13:22      Assessment/Plan Fall down embankment SDH - Dr. Franky Macho has seen.  No acute intervention.  No need to reverse plavix. Scalp laceration/hematoma - repaired by EDP C1 TVP fx, LC5 foramen fx, C5 spinous process fx - collar per Dr. Franky Macho.  CTA pending, but patient already on ASA and plavix if any injury was noted. "Hemoptysis" - no blood in oropharynx.  No epistaxis noted.  No evidence of pulmonary trauma on CT.  It is possible there is some laceration/abrasion more posterior in nasal, posterior throat, etc that we can't see on physical exam.  If this doesn't stop, then may need ENT evaluation. HTN HLD CKD CHF CAD, stents on plavix  Discussed patient with Dr. Derrell Lolling and recommend medical admission.  Dr. Franky Macho has seen the patient from a NSGY standpoint and recommends a c-collar.  We would defer other medical issues to the medical service at this time.  May need ENT consult if continues to BRB per mouth.  D/w NSGY and EDP at bedside  I reviewed nursing  notes,  last 24 h vitals and pain scores, last 48 h intake and output, last 24 h labs and trends, and last 24 h imaging results.  Letha Cape, [facility] 04/02/2023, 2:51 PM Please see Amion for pager number during day hours 7:00am-4:30pm or 7:00am -11:30am on weekends

## 2023-04-02 NOTE — ED Notes (Signed)
 RN cleaned pt face, arms, and legs. Pt right arm was bandaged, left elbow bandaid, and right ring finger tip bandaide.

## 2023-04-02 NOTE — ED Provider Notes (Signed)
  [place] EMERGENCY DEPARTMENT AT [facility] Provider Procedure Note   CSN: [id number] Arrival date & time: 04/02/23  1112  Procedures .Laceration Repair  Date/Time: 04/02/2023 3:02 PM  Performed by: Wynetta Fines, MD Authorized by: Wynetta Fines, MD   Consent:    Consent obtained:  Verbal   Consent given by:  Patient   Risks, benefits, and alternatives were discussed: yes     Risks discussed:  Infection, need for additional repair, nerve damage, poor wound healing, poor cosmetic result, pain, retained foreign body, tendon damage and vascular damage   Alternatives discussed:  No treatment Universal protocol:    Immediately prior to procedure, a time out was called: yes     Patient identity confirmed:  Verbally with patient Anesthesia:    Anesthesia method:  Local infiltration   Local anesthetic:  Lidocaine 2% WITH epi Laceration details:    Location:  Face   Face location:  Forehead   Length (cm):  4 Pre-procedure details:    Preparation:  Imaging obtained to evaluate for foreign bodies and patient was prepped and draped in usual sterile fashion Exploration:    Limited defect created (wound extended): no     Hemostasis achieved with:  Direct pressure   Imaging outcome: foreign body not noted     Wound exploration: wound explored through full range of motion     Contaminated: no   Treatment:    Area cleansed with:  Povidone-iodine   Amount of cleaning:  Extensive   Irrigation solution:  Sterile saline   Irrigation method:  Pressure wash   Debridement:  None   Undermining:  None   Scar revision: no   Skin repair:    Repair method:  Sutures   Suture size:  4-0   Wound skin closure material used: vicryl.   Suture technique:  Simple interrupted Approximation:    Approximation:  Close Repair type:    Repair type:  Simple Post-procedure details:    Dressing:  Antibiotic ointment and bulky dressing   Procedure completion:  Tolerated     Medications  Ordered in ED Medications  bacitracin 500 UNIT/GM ointment (has no administration in time range)  Tdap (BOOSTRIX) injection 0.5 mL (0.5 mLs Intramuscular Given 04/02/23 1123)  lidocaine-EPINEPHrine (XYLOCAINE W/EPI) 2 %-1:200000 (PF) injection 10 mL (10 mLs Infiltration Given 04/02/23 1429)  morphine (PF) 4 MG/ML injection 4 mg (4 mg Intravenous Given 04/02/23 1431)  ondansetron (ZOFRAN) injection 4 mg (4 mg Intravenous Given 04/02/23 1430)  sodium chloride 0.9 % bolus 500 mL (500 mLs Intravenous New Bag/Given 04/02/23 1430)  {       Wynetta Fines, MD 04/02/23 1505

## 2023-04-02 NOTE — Progress Notes (Signed)
 VAST consult. Arrived to unit. Patient off unit at this time. Notified nurse to place consult when patient is back on unit and ready for PIV. Tomasita Morrow RN VAST

## 2023-04-02 NOTE — ED Notes (Signed)
 Trauma Event Note    Assisted Dr. Rodena Medin with sutures/wound care-  Bacitracin and nonadherent dressing placed on avulsion/abrasion type wound to bridge of nose. Bacitracin and nonadherent dressing placed on forehead sutures per Dr. Rodena Medin.   Pt BP is sensitive to Morphine -- dropped into 80s and 90s-- Dr. Rodena Medin aware and Trauma PA aware. NS IV running at 999cc on pump.   Last imported Vital Signs BP (!) 109/53   Pulse 78   Temp 97.6 F (36.4 C)   Resp (!) 28   Ht 5\' 10"  (1.778 m)   Wt 197 lb (89.4 kg)   SpO2 96%   BMI 28.27 kg/m   Trending CBC Recent Labs    04/02/23 1120 04/02/23 1130  WBC 7.2  --   HGB 10.9* 9.5*  HCT 32.3* 28.0*  PLT 178  --     Trending Coag's Recent Labs    04/02/23 1120  INR 1.2    Trending BMET Recent Labs    04/02/23 1120 04/02/23 1130  NA 139 137  K 4.5 4.5  CL 108 108  CO2 22  --   BUN 28* 29*  CREATININE 1.85* 2.00*  GLUCOSE 130* 120*      Shawn Andrews [name]  Trauma Response RN  Please call TRN at [phone] for further assistance.

## 2023-04-02 NOTE — Progress Notes (Signed)
 Trauma Event Note    Notified Dr. Franky Macho of abnormal CT angio neck - plan of care would be aspirin but pt is on plavix per Dr. Franky Macho will be more than sufficient. No changes in POC at this time. CAGE[employer]ID completed.  Last imported Vital Signs BP 93/77   Pulse 83   Temp (!) 97.2 F (36.2 C) (Axillary)   Resp (!) 40   Ht 5\' 10"  (1.778 m)   Wt 197 lb (89.4 kg)   SpO2 98%   BMI 28.27 kg/m   Trending CBC Recent Labs    04/02/23 1120 04/02/23 1130 04/02/23 1910  WBC 7.2  --  16.3*  HGB 10.9* 9.5* 7.5*  HCT 32.3* 28.0* 22.5*  PLT 178  --  259    Trending Coag's Recent Labs    04/02/23 1120  INR 1.2    Trending BMET Recent Labs    04/02/23 1120 04/02/23 1130  NA 139 137  K 4.5 4.5  CL 108 108  CO2 22  --   BUN 28* 29*  CREATININE 1.85* 2.00*  GLUCOSE 130* 120*      Shawn Andrews Shawn Andrews  Trauma Response RN  Please call TRN at [phone] for further assistance.

## 2023-04-03 ENCOUNTER — Inpatient Hospital Stay (HOSPITAL_COMMUNITY)

## 2023-04-03 DIAGNOSIS — N401 Enlarged prostate with lower urinary tract symptoms: Secondary | ICD-10-CM | POA: Diagnosis not present

## 2023-04-03 DIAGNOSIS — S129XXA Fracture of neck, unspecified, initial encounter: Secondary | ICD-10-CM

## 2023-04-03 DIAGNOSIS — R338 Other retention of urine: Secondary | ICD-10-CM

## 2023-04-03 DIAGNOSIS — N1832 Chronic kidney disease, stage 3b: Secondary | ICD-10-CM | POA: Diagnosis not present

## 2023-04-03 DIAGNOSIS — S065XAA Traumatic subdural hemorrhage with loss of consciousness status unknown, initial encounter: Secondary | ICD-10-CM | POA: Diagnosis not present

## 2023-04-03 DIAGNOSIS — W19XXXA Unspecified fall, initial encounter: Secondary | ICD-10-CM

## 2023-04-03 DIAGNOSIS — I129 Hypertensive chronic kidney disease with stage 1 through stage 4 chronic kidney disease, or unspecified chronic kidney disease: Secondary | ICD-10-CM

## 2023-04-03 LAB — TYPE AND SCREEN
ABO/RH(D): A NEG
Antibody Screen: NEGATIVE
Unit division: 0

## 2023-04-03 LAB — TROPONIN I (HIGH SENSITIVITY)
Troponin I (High Sensitivity): 99 ng/L — ABNORMAL HIGH (ref <18)
Troponin I (High Sensitivity): 170 ng/L (ref <18)
Troponin I (High Sensitivity): 253 ng/L (ref <18)

## 2023-04-03 LAB — CBC
HCT: 22.5 % — ABNORMAL LOW (ref 39.0–52.0)
HCT: 22.7 % — ABNORMAL LOW (ref 39.0–52.0)
HCT: 23.6 % — ABNORMAL LOW (ref 39.0–52.0)
Hemoglobin: 8 g/dL — ABNORMAL LOW (ref 13.0–17.0)
Hemoglobin: 7.7 g/dL — ABNORMAL LOW (ref 13.0–17.0)
Hemoglobin: 8 g/dL — ABNORMAL LOW (ref 13.0–17.0)
MCH: 30.5 pg (ref 26.0–34.0)
MCH: 29.4 pg (ref 26.0–34.0)
MCH: 30 pg (ref 26.0–34.0)
MCHC: 35.6 g/dL (ref 30.0–36.0)
MCHC: 33.9 g/dL (ref 30.0–36.0)
MCHC: 33.9 g/dL (ref 30.0–36.0)
MCV: 85.9 fL (ref 80.0–100.0)
MCV: 86.6 fL (ref 80.0–100.0)
MCV: 88.4 fL (ref 80.0–100.0)
Platelets: 177 10*3/uL (ref 150–400)
Platelets: 178 10*3/uL (ref 150–400)
Platelets: 200 10*3/uL (ref 150–400)
RBC: 2.62 MIL/uL — ABNORMAL LOW (ref 4.22–5.81)
RBC: 2.62 MIL/uL — ABNORMAL LOW (ref 4.22–5.81)
RBC: 2.67 MIL/uL — ABNORMAL LOW (ref 4.22–5.81)
RDW: 13.6 % (ref 11.5–15.5)
RDW: 13.9 % (ref 11.5–15.5)
RDW: 14.1 % (ref 11.5–15.5)
WBC: 18.6 10*3/uL — ABNORMAL HIGH (ref 4.0–10.5)
WBC: 17.6 10*3/uL — ABNORMAL HIGH (ref 4.0–10.5)
WBC: 19.2 10*3/uL — ABNORMAL HIGH (ref 4.0–10.5)
nRBC: 0 % (ref 0.0–0.2)
nRBC: 0 % (ref 0.0–0.2)
nRBC: 0 % (ref 0.0–0.2)

## 2023-04-03 LAB — BASIC METABOLIC PANEL
Anion gap: 12 (ref 5–15)
BUN: 59 mg/dL — ABNORMAL HIGH (ref 8–23)
CO2: 21 mmol/L — ABNORMAL LOW (ref 22–32)
Calcium: 7.7 mg/dL — ABNORMAL LOW (ref 8.9–10.3)
Chloride: 107 mmol/L (ref 98–111)
Creatinine, Ser: 2.09 mg/dL — ABNORMAL HIGH (ref 0.61–1.24)
GFR, Estimated: 29 mL/min — ABNORMAL LOW (ref >60)
Glucose, Bld: 174 mg/dL — ABNORMAL HIGH (ref 70–99)
Potassium: 5.3 mmol/L — ABNORMAL HIGH (ref 3.5–5.1)
Sodium: 140 mmol/L (ref 135–145)

## 2023-04-03 LAB — APTT: aPTT: 26 s (ref 24–36)

## 2023-04-03 LAB — PROTIME-INR
INR: 1.3 — ABNORMAL HIGH (ref 0.8–1.2)
Prothrombin Time: 16.8 s — ABNORMAL HIGH (ref 11.4–15.2)

## 2023-04-03 LAB — BPAM RBC
Blood Product Expiration Date: 202503182359
ISSUE DATE / TIME: 202503122237
Unit Type and Rh: 600

## 2023-04-03 MED ORDER — SUCRALFATE 1 GM/10ML PO SUSP
1.0000 g | Freq: Two times a day (BID) | ORAL | Status: DC
  Administered 2023-04-04: 1 g via ORAL
  Filled 2023-04-03: qty 10

## 2023-04-03 MED ORDER — OXYCODONE HCL 5 MG PO TABS
2.5000 mg | ORAL_TABLET | ORAL | Status: DC | PRN
  Administered 2023-04-05 – 2023-04-07 (×4): 5 mg via ORAL
  Filled 2023-04-03 (×4): qty 1

## 2023-04-03 MED ORDER — NITROGLYCERIN 0.4 MG SL SUBL: 0.4000 mg | SUBLINGUAL_TABLET | SUBLINGUAL | Status: DC | PRN

## 2023-04-03 MED ORDER — ACETAMINOPHEN 500 MG PO TABS: 1000.0000 mg | ORAL_TABLET | Freq: Four times a day (QID) | ORAL | Status: DC | PRN

## 2023-04-03 MED ORDER — BACITRACIN ZINC 500 UNIT/GM EX OINT
TOPICAL_OINTMENT | Freq: Two times a day (BID) | CUTANEOUS | Status: DC
  Administered 2023-04-03: 31.5 via TOPICAL
  Administered 2023-04-04: 1 via TOPICAL
  Administered 2023-04-04: 31.5 via TOPICAL
  Administered 2023-04-05: 1 via TOPICAL
  Administered 2023-04-06 – 2023-04-08 (×5): 31.5 via TOPICAL
  Filled 2023-04-03: qty 28.4

## 2023-04-03 MED ORDER — SUCRALFATE 1 GM/10ML PO SUSP
1.0000 g | Freq: Two times a day (BID) | ORAL | Status: DC
  Administered 2023-04-03: 1 g via ORAL
  Filled 2023-04-03: qty 10

## 2023-04-03 MED ORDER — NITROGLYCERIN 0.4 MG SL SUBL: 0.4000 mg | SUBLINGUAL_TABLET | Freq: Once | SUBLINGUAL | Status: AC

## 2023-04-03 MED ORDER — PANTOPRAZOLE SODIUM 40 MG IV SOLR
40.0000 mg | Freq: Once | INTRAVENOUS | Status: AC
  Administered 2023-04-03: 40 mg via INTRAVENOUS
  Filled 2023-04-03 (×2): qty 10

## 2023-04-03 MED ORDER — NITROGLYCERIN 0.4 MG SL SUBL
SUBLINGUAL_TABLET | SUBLINGUAL | Status: AC
  Administered 2023-04-03: 0.4 mg via SUBLINGUAL
  Filled 2023-04-03: qty 1

## 2023-04-03 NOTE — Progress Notes (Signed)
 Pt back to the unit from radiology. Dionne Bucy RN

## 2023-04-03 NOTE — Progress Notes (Addendum)
 Modified Barium Swallow Study  Patient Details  Name: Shawn Andrews MRN: [id number] Date of Birth: Apr 13, 1928  Today's Date: 04/03/2023  Modified Barium Swallow completed.  Full report located under Chart Review in the Imaging Section.  History of Present Illness Shawn Andrews is a 89 yo male presenting to ED 3/12 after a mechanical fall down a 10 ft embankment with multiple facial abrasions as well as a scalp laceration and hematoma. Imaging reveals 4 mm para falcine SDH without midline shift and displaced fxs of L C1 and C5 in addition to stenosis at C5-6 with multilevel bony narrowing. Significant epistaxis requiring cauterization by ENT. Esophagram 12/14/22 shows mild gastroesophageal reflux but no stricture, mass, or hiatal hernia. Pt's wife reports this is managed well with medication.   PMH includes CAD s/p stent placement 2024 on ASA and plavix, CHF, BPH requiring SP tube placement, HTN, CKD, HLD   Clinical Impression Pt presents with moderate oropharyngeal dysphagia chracterized by reduced bolus control. Pt reports oral pain, which affects oral transit and mastication. Pt tends to hold boluses orally until the head progresses posteriorly and pools in the pyriform sinuses, triggering a swallowing response. Suspect this will be improved as pain improves and upper dentures are placed. With thin liquids, pt is unable to protect his airway sufficienty before and after the swallow resulting in silent aspiration (PAS 8). A cued cough is only somewhat effective at clearing aspirates. Pharyngeal clearance and PES opening is suspected to be affected by cervical osteophytes. Pt has a history of gastroesophageal reflux, which is now managed with medication, but its impact on swallowing may be further affected by these factors. When taking large successive sips, nectar thick liquids are transiently penetrated (PAS 2), but this is considered a functional variant. Given oral discomfort and reduced safety,  recommend initiating diet of Dys 3 solids with nectar thick liquids. SLP will f/u to provide education to pt and his family and assess ability to upgrade diet as clinically indicated.  DIGEST Swallow Severity Rating*  Safety: 2  Efficiency: 1  Overall Pharyngeal Swallow Severity: 2 (moderate) 1: mild; 2: moderate; 3: severe; 4: profound  *The Dynamic Imaging Grade of Swallowing Toxicity is standardized for the head and neck cancer population, however, demonstrates promising clinical applications across populations to standardize the clinical rating of pharyngeal swallow safety and severity.  Factors that may increase risk of adverse event in presence of aspiration Shawn Andrews [employer] 2021): Limited mobility;Frail or deconditioned;Weak cough  Swallow Evaluation Recommendations Recommendations: PO diet PO Diet Recommendation: Mildly thick liquids (Level 2, nectar thick);Dysphagia 3 (Mechanical soft) Liquid Administration via: Cup;Straw Medication Administration: Crushed with puree Supervision: Staff to assist with self-feeding;Full supervision/cueing for swallowing strategies Swallowing strategies  : Minimize environmental distractions;Slow rate;Small bites/sips Postural changes: Position pt fully upright for meals;Stay upright 30-60 min after meals Oral care recommendations: Oral care BID (2x/day)     Gwynneth Aliment, M.A., CF-SLP Speech Language Pathology, Acute Rehabilitation Services  Secure Chat preferred [phone]  04/03/2023,3:36 PM

## 2023-04-03 NOTE — Progress Notes (Signed)
 Evaluated patient at bedside as family endorsed blood in stool. Per day team, this has been attributed to posterior oropharynx bleed. Will repeat Hgb at 20:00 to ensure Hgb is stable. While in the room, patient endorsed substernal chest pain without radiation and was diaphoretic/tachycardic to 110. BP stable. Given cardiac history, STAT EKG was obtained that did not show evidence of STE. Troponins pending. He was given SL nitroglycerin with improvement in symptoms. He also has extensive history with GERD and patient was initially attributing chest pain to this so difficult to determine chest pain etiology although presentation is consistent with typical chest pain. Will order nitroglycerin prn and follow-up on troponin.

## 2023-04-03 NOTE — Progress Notes (Addendum)
 Progress Note     Subjective: Having some pain in upper back/shoulders which he thinks may be related to c collar. No further bleeding per mouth/nose. Pain overall well controlled. Denies n/v/abd pain. On supp o2 this am. Denies cough or SHOB  Wife at bedside  Objective: Vital signs in last 24 hours: Temp:  [97.2 F (36.2 C)-100.4 F (38 C)] 100.4 F (38 C) (03/13 0742) Pulse Rate:  [70-90] 77 (03/13 0742) Resp:  [14-40] 26 (03/13 0742) BP: (83-149)/(44-79) 109/44 (03/13 0742) SpO2:  [92 %-100 %] 98 % (03/13 0742) Weight:  [89.4 kg] 89.4 kg (03/12 1123)    Intake/Output from previous day: 03/12 0701 - 03/13 0700 In: 864 [I.V.:500; Blood:364] Out: 700 [Urine:350] Intake/Output this shift: No intake/output data recorded.  PE: General: pleasant, WD, male who is laying in bed in NAD HEENT: forehead lac with sutures cdi. Scattered abrasions to face none actively bleeding. Right periorbital ecchymosis and edema C collar in place Heart: regular, rate, and rhythm.  Lungs: CTAB.  Respiratory effort nonlabored on supplemental O2 via [place] Abd: soft, NT, ND MSK: MAE Skin: warm and dry Psych: A&Ox3 with an appropriate affect.    Lab Results:  Recent Labs    04/02/23 1120 04/02/23 1130 04/02/23 1910  WBC 7.2  --  16.3*  HGB 10.9* 9.5* 7.5*  HCT 32.3* 28.0* 22.5*  PLT 178  --  259   BMET Recent Labs    04/02/23 1120 04/02/23 1130 04/03/23 0442  NA 139 137 140  K 4.5 4.5 5.3*  CL 108 108 107  CO2 22  --  21*  GLUCOSE 130* 120* 174*  BUN 28* 29* 59*  CREATININE 1.85* 2.00* 2.09*  CALCIUM 8.3*  --  7.7*   PT/INR Recent Labs    04/02/23 1120 04/03/23 0442  LABPROT 15.4* 16.8*  INR 1.2 1.3*   CMP     Component Value Date/Time   NA 140 04/03/2023 0442   K 5.3 (H) 04/03/2023 0442   CL 107 04/03/2023 0442   CO2 21 (L) 04/03/2023 0442   GLUCOSE 174 (H) 04/03/2023 0442   BUN 59 (H) 04/03/2023 0442   CREATININE 2.09 (H) 04/03/2023 0442   CALCIUM 7.7 (L)  04/03/2023 0442   PROT 5.9 (L) 04/02/2023 1120   ALBUMIN 3.6 04/02/2023 1120   AST 21 04/02/2023 1120   ALT 15 04/02/2023 1120   ALKPHOS 49 04/02/2023 1120   BILITOT 0.3 04/02/2023 1120   GFRNONAA 29 (L) 04/03/2023 0442   GFRAA >60 08/02/2019 1203   Lipase     Component Value Date/Time   LIPASE 27 12/09/2022 1832       Studies/Results: CT HEAD WO CONTRAST ( ) Addendum Date: 04/02/2023 ADDENDUM REPORT: 04/02/2023 23:28 ADDENDUM: The above described hematoma is unchanged since the scan of 04/02/2023 11:49 a.m. Electronically Signed   By: Deatra Robinson M.D.   On: 04/02/2023 23:28   Result Date: 04/02/2023 CLINICAL DATA:  Head trauma EXAM: CT HEAD WITHOUT CONTRAST TECHNIQUE: Contiguous axial images were obtained from the base of the skull through the vertex without intravenous contrast. RADIATION DOSE REDUCTION: This exam was performed according to the departmental dose-optimization program which includes automated exposure control, adjustment of the mA and/or kV according to patient size and/or use of iterative reconstruction technique. COMPARISON:  None Available. FINDINGS: Brain: There is a 4 mm para falcine subdural hematoma. No midline shift or other mass effect. Chronic ischemic white matter changes. Vascular: There is atherosclerotic calcification of both internal carotid  arteries at the skull base. Skull: Large frontal scalp hematoma.  No skull fracture. Sinuses/Orbits: No acute finding. Other: None. IMPRESSION: 1. A 4 mm para falcine subdural hematoma. No midline shift or other mass effect. 2. Large frontal scalp hematoma without skull fracture. Electronically Signed: By: Deatra Robinson M.D. On: 04/02/2023 23:14   CT Angio Neck W and/or Wo Contrast Result Date: 04/02/2023 CLINICAL DATA:  Neck trauma, arterial injury suspected. EXAM: CT ANGIOGRAPHY NECK TECHNIQUE: Multidetector CT imaging of the neck was performed using the standard protocol during bolus administration of intravenous  contrast. Multiplanar CT image reconstructions and MIPs were obtained to evaluate the vascular anatomy. Carotid stenosis measurements (when applicable) are obtained utilizing NASCET criteria, using the distal internal carotid diameter as the denominator. RADIATION DOSE REDUCTION: This exam was performed according to the departmental dose-optimization program which includes automated exposure control, adjustment of the mA and/or kV according to patient size and/or use of iterative reconstruction technique. CONTRAST:  75mL OMNIPAQUE IOHEXOL 350 MG/ML SOLN COMPARISON:  Cervical spine CT 04/02/2023 FINDINGS: Aortic arch: Standard branching with mild atherosclerosis. No significant stenosis of the arch vessel origins. Right carotid system: Patent with extensive calcified plaque in the carotid bulb resulting in 65% proximal ICA stenosis. Tortuous mid cervical ICA. No evidence of dissection. Left carotid system: Patent with a moderate amount of calcified plaque about the carotid bifurcation. No evidence of a significant stenosis or dissection. Tortuous distal cervical ICA. Vertebral arteries: Patent without evidence of a significant stenosis or dissection of the dominant left vertebral artery. In the right V3 segment at the C2 level, there is an apparent 4 mm long segment of diminished vessel opacification and severe stenosis, possibly with an intimal flap or thrombus (series 8, images 91 and 92), although streak artifact in this region could also be contributing to this appearance with mild artifact noted in the left vertebral artery at this same level. The right V3 segment is widely patent more distally. No pseudoaneurysm. Skeleton: Left C1 and C5 transverse process fractures and C5 spinous process fracture as described on today's earlier spine CT. Other neck: No evidence of cervical lymphadenopathy or mass. Upper chest: More fully evaluated on today's earlier chest CT. IMPRESSION: 1. Focally abnormal appearance of the  right vertebral artery at the C2 level with apparent severe stenosis. In the setting of trauma, this is suspicious for grade 2 blunt cerebrovascular injury although streak artifact may also be contributing. 2. No evidence of acute arterial injury elsewhere in the neck. 3. 65% proximal right ICA stenosis. 4.  Aortic Atherosclerosis (ICD10-I70.0). Electronically Signed   By: Sebastian Ache M.D.   On: 04/02/2023 17:23   CT CHEST ABDOMEN PELVIS WO CONTRAST Result Date: 04/02/2023 CLINICAL DATA:  Fall. EXAM: CT CHEST, ABDOMEN AND PELVIS WITHOUT CONTRAST TECHNIQUE: Multidetector CT imaging of the chest, abdomen and pelvis was performed following the standard protocol without IV contrast. RADIATION DOSE REDUCTION: This exam was performed according to the departmental dose-optimization program which includes automated exposure control, adjustment of the mA and/or kV according to patient size and/or use of iterative reconstruction technique. COMPARISON:  August 08, 2022. FINDINGS: CT CHEST FINDINGS Cardiovascular: Atherosclerosis of thoracic aorta without aneurysm formation. Normal cardiac size. No pericardial effusion. Coronary artery calcifications are noted consistent with coronary artery disease. Mediastinum/Nodes: No enlarged mediastinal, hilar, or axillary lymph nodes. Thyroid gland, trachea, and esophagus demonstrate no significant findings. Lungs/Pleura: Minimal left pleural effusion is noted. No pneumothorax is noted. No acute consolidative process is noted. Musculoskeletal: No chest  wall mass or suspicious bone lesions identified. CT ABDOMEN PELVIS FINDINGS Hepatobiliary: No focal liver abnormality is seen. No gallstones, gallbladder wall thickening, or biliary dilatation. Pancreas: Unremarkable. No pancreatic ductal dilatation or surrounding inflammatory changes. Spleen: Normal in size without focal abnormality. Adrenals/Urinary Tract: Adrenal glands appear normal. Bilateral renal cysts are noted for which no  further follow-up is required. No hydronephrosis or renal obstruction is noted. Urinary bladder is decompressed secondary to suprapubic catheter. Stomach/Bowel: Stomach is unremarkable. There is no evidence of bowel obstruction or inflammation. The appendix is unremarkable. Diverticulosis of descending and sigmoid colon is noted without inflammation. Vascular/Lymphatic: Aortic atherosclerosis. No enlarged abdominal or pelvic lymph nodes. Reproductive: Moderate prostatic enlargement is noted. Other: No abdominal wall hernia or abnormality. No abdominopelvic ascites. Musculoskeletal: Mild grade 1 anterolisthesis of L5-S1 is noted secondary to bilateral L5 spondylolysis. No acute osseous abnormality is noted. IMPRESSION: Minimal left pleural effusion is noted. No other traumatic injury seen in the chest, abdomen or pelvis. Diverticulosis of descending and sigmoid colon is noted without inflammation. Moderate prostatic enlargement. Extensive coronary artery calcifications are noted suggesting coronary artery disease. Aortic Atherosclerosis (ICD10-I70.0). Electronically Signed   By: Lupita Raider M.D.   On: 04/02/2023 15:01   CT HEAD WO CONTRAST Result Date: 04/02/2023 CLINICAL DATA:  Provided history: Facial trauma, blunt. Additional history provided: Fall (hitting ground and branches). On blood thinner. Right forehead swelling. Right eye swelling. Upper lip laceration. EXAM: CT HEAD WITHOUT CONTRAST CT MAXILLOFACIAL WITHOUT CONTRAST CT CERVICAL SPINE WITHOUT CONTRAST TECHNIQUE: Multidetector CT imaging of the head, cervical spine, and maxillofacial structures were performed using the standard protocol without intravenous contrast. Multiplanar CT image reconstructions of the cervical spine and maxillofacial structures were also generated. RADIATION DOSE REDUCTION: This exam was performed according to the departmental dose-optimization program which includes automated exposure control, adjustment of the mA and/or kV  according to patient size and/or use of iterative reconstruction technique. COMPARISON:  Head CT 08/09/2022. FINDINGS: CT HEAD FINDINGS Brain: Generalized cerebral atrophy. Acute subdural hematoma along the left aspect of the falx measuring up to 3 mm in thickness (for instance as seen on series 3, image 25). No demarcated cortical infarct. No evidence of an intracranial mass. No midline shift. Vascular: No hyperdense vessel.  Atherosclerotic calcifications. Skull: No calvarial fracture or aggressive osseous lesion. Other: Large right anterior scalp, forehead and periorbital hematoma. Forehead laceration. CT MAXILLOFACIAL FINDINGS Osseous: No acute maxillofacial fracture is identified. Orbits: Large right periorbital hematoma. No acute finding within the orbits. Sinuses: Moderate polypoid mucosal thickening within the right maxillary sinus. Moderate mucosal thickening within the left maxillary sinus. Minimal mucosal thickening within bilateral ethmoid air cells. Soft tissues: Large right anterior scalp, forehead and periorbital hematoma. Forehead laceration. CT CERVICAL SPINE FINDINGS Alignment: Slight C4-C5 grade 1 anterolisthesis. Slight C5-C6 grade 1 retrolisthesis. Slight T2-T3 grade 1 anterolisthesis. Skull base and vertebrae: The basion-dental and atlanto-dental intervals are maintained. Acute displaced fracture of the left C1 transverse process (lateral to the foramen transversarium) (for instance as seen on series 9, image 66) (series 4, image 30). Mildly displaced acute fracture through the left C5 foramen transversarium (series 10, images 50-51). Acute, displaced fracture of the C5 spinous process (series 10, image 57). No other acute cervical spine fracture is identified. Soft tissues and spinal canal: No prevertebral fluid or swelling. No visible canal hematoma. Disc levels: Cervical spondylosis with multilevel disc space narrowing, disc bulges/central disc protrusions, uncovertebral hypertrophy and  facet arthropathy. Disc space narrowing is greatest at C4-C5 and  C5-C6 (moderate to advanced at these levels). Multilevel spinal canal narrowing. Most notably at C5-C6, a central disc protrusion contributes to at least moderate spinal canal stenosis. Multilevel bony neural foraminal narrowing. Upper chest: No consolidation within the imaged lung apices. No visible pneumothorax. Acute findings called by telephone at the time of interpretation on 04/02/2023 at 1:49 pm to provider JULIE HAVILAND , who verbally acknowledged these results. IMPRESSION: CT head: 1. Acute subdural hematoma along the left aspect of the falx, measuring up to 3 mm in thickness. 2. Large right anterior scalp, forehead and periorbital hematoma. 3. Forehead laceration. 4. Cerebral atrophy. CT maxillofacial: 1. No evidence of an acute maxillofacial fracture. 2. Large right anterior scalp, forehead and periorbital hematoma. 3. Forehead laceration. 4. Paranasal sinus disease as described. CT cervical spine: 1. Acute displaced fracture of the left C1 transverse process (lateral to the foramen transversarium). 2. Acute mildly displaced fracture through the left C5 foramen transversarium. A CTA of the neck is recommended to exclude traumatic injury to the left vertebral artery. 3. Acute, displaced fracture of the C5 spinous process. 4. Mild grade 1 spondylolisthesis at C4-C5, C5-C6 and T2-T3. 5. Cervical spondylosis as described within the body of the report. Spinal canal stenosis is greatest at C5-C6 (at least moderate at this level). Multilevel bony neural foraminal narrowing. Electronically Signed   By: Jackey Loge D.O.   On: 04/02/2023 13:51   CT MAXILLOFACIAL WO CONTRAST Result Date: 04/02/2023 CLINICAL DATA:  Provided history: Facial trauma, blunt. Additional history provided: Fall (hitting ground and branches). On blood thinner. Right forehead swelling. Right eye swelling. Upper lip laceration. EXAM: CT HEAD WITHOUT CONTRAST CT MAXILLOFACIAL  WITHOUT CONTRAST CT CERVICAL SPINE WITHOUT CONTRAST TECHNIQUE: Multidetector CT imaging of the head, cervical spine, and maxillofacial structures were performed using the standard protocol without intravenous contrast. Multiplanar CT image reconstructions of the cervical spine and maxillofacial structures were also generated. RADIATION DOSE REDUCTION: This exam was performed according to the departmental dose-optimization program which includes automated exposure control, adjustment of the mA and/or kV according to patient size and/or use of iterative reconstruction technique. COMPARISON:  Head CT 08/09/2022. FINDINGS: CT HEAD FINDINGS Brain: Generalized cerebral atrophy. Acute subdural hematoma along the left aspect of the falx measuring up to 3 mm in thickness (for instance as seen on series 3, image 25). No demarcated cortical infarct. No evidence of an intracranial mass. No midline shift. Vascular: No hyperdense vessel.  Atherosclerotic calcifications. Skull: No calvarial fracture or aggressive osseous lesion. Other: Large right anterior scalp, forehead and periorbital hematoma. Forehead laceration. CT MAXILLOFACIAL FINDINGS Osseous: No acute maxillofacial fracture is identified. Orbits: Large right periorbital hematoma. No acute finding within the orbits. Sinuses: Moderate polypoid mucosal thickening within the right maxillary sinus. Moderate mucosal thickening within the left maxillary sinus. Minimal mucosal thickening within bilateral ethmoid air cells. Soft tissues: Large right anterior scalp, forehead and periorbital hematoma. Forehead laceration. CT CERVICAL SPINE FINDINGS Alignment: Slight C4-C5 grade 1 anterolisthesis. Slight C5-C6 grade 1 retrolisthesis. Slight T2-T3 grade 1 anterolisthesis. Skull base and vertebrae: The basion-dental and atlanto-dental intervals are maintained. Acute displaced fracture of the left C1 transverse process (lateral to the foramen transversarium) (for instance as seen on  series 9, image 66) (series 4, image 30). Mildly displaced acute fracture through the left C5 foramen transversarium (series 10, images 50-51). Acute, displaced fracture of the C5 spinous process (series 10, image 57). No other acute cervical spine fracture is identified. Soft tissues and spinal canal: No prevertebral fluid or  swelling. No visible canal hematoma. Disc levels: Cervical spondylosis with multilevel disc space narrowing, disc bulges/central disc protrusions, uncovertebral hypertrophy and facet arthropathy. Disc space narrowing is greatest at C4-C5 and C5-C6 (moderate to advanced at these levels). Multilevel spinal canal narrowing. Most notably at C5-C6, a central disc protrusion contributes to at least moderate spinal canal stenosis. Multilevel bony neural foraminal narrowing. Upper chest: No consolidation within the imaged lung apices. No visible pneumothorax. Acute findings called by telephone at the time of interpretation on 04/02/2023 at 1:49 pm to provider JULIE HAVILAND , who verbally acknowledged these results. IMPRESSION: CT head: 1. Acute subdural hematoma along the left aspect of the falx, measuring up to 3 mm in thickness. 2. Large right anterior scalp, forehead and periorbital hematoma. 3. Forehead laceration. 4. Cerebral atrophy. CT maxillofacial: 1. No evidence of an acute maxillofacial fracture. 2. Large right anterior scalp, forehead and periorbital hematoma. 3. Forehead laceration. 4. Paranasal sinus disease as described. CT cervical spine: 1. Acute displaced fracture of the left C1 transverse process (lateral to the foramen transversarium). 2. Acute mildly displaced fracture through the left C5 foramen transversarium. A CTA of the neck is recommended to exclude traumatic injury to the left vertebral artery. 3. Acute, displaced fracture of the C5 spinous process. 4. Mild grade 1 spondylolisthesis at C4-C5, C5-C6 and T2-T3. 5. Cervical spondylosis as described within the body of the  report. Spinal canal stenosis is greatest at C5-C6 (at least moderate at this level). Multilevel bony neural foraminal narrowing. Electronically Signed   By: Jackey Loge D.O.   On: 04/02/2023 13:51   CT CERVICAL SPINE WO CONTRAST Result Date: 04/02/2023 CLINICAL DATA:  Provided history: Facial trauma, blunt. Additional history provided: Fall (hitting ground and branches). On blood thinner. Right forehead swelling. Right eye swelling. Upper lip laceration. EXAM: CT HEAD WITHOUT CONTRAST CT MAXILLOFACIAL WITHOUT CONTRAST CT CERVICAL SPINE WITHOUT CONTRAST TECHNIQUE: Multidetector CT imaging of the head, cervical spine, and maxillofacial structures were performed using the standard protocol without intravenous contrast. Multiplanar CT image reconstructions of the cervical spine and maxillofacial structures were also generated. RADIATION DOSE REDUCTION: This exam was performed according to the departmental dose-optimization program which includes automated exposure control, adjustment of the mA and/or kV according to patient size and/or use of iterative reconstruction technique. COMPARISON:  Head CT 08/09/2022. FINDINGS: CT HEAD FINDINGS Brain: Generalized cerebral atrophy. Acute subdural hematoma along the left aspect of the falx measuring up to 3 mm in thickness (for instance as seen on series 3, image 25). No demarcated cortical infarct. No evidence of an intracranial mass. No midline shift. Vascular: No hyperdense vessel.  Atherosclerotic calcifications. Skull: No calvarial fracture or aggressive osseous lesion. Other: Large right anterior scalp, forehead and periorbital hematoma. Forehead laceration. CT MAXILLOFACIAL FINDINGS Osseous: No acute maxillofacial fracture is identified. Orbits: Large right periorbital hematoma. No acute finding within the orbits. Sinuses: Moderate polypoid mucosal thickening within the right maxillary sinus. Moderate mucosal thickening within the left maxillary sinus. Minimal mucosal  thickening within bilateral ethmoid air cells. Soft tissues: Large right anterior scalp, forehead and periorbital hematoma. Forehead laceration. CT CERVICAL SPINE FINDINGS Alignment: Slight C4-C5 grade 1 anterolisthesis. Slight C5-C6 grade 1 retrolisthesis. Slight T2-T3 grade 1 anterolisthesis. Skull base and vertebrae: The basion-dental and atlanto-dental intervals are maintained. Acute displaced fracture of the left C1 transverse process (lateral to the foramen transversarium) (for instance as seen on series 9, image 66) (series 4, image 30). Mildly displaced acute fracture through the left C5 foramen transversarium (series  10, images 50-51). Acute, displaced fracture of the C5 spinous process (series 10, image 57). No other acute cervical spine fracture is identified. Soft tissues and spinal canal: No prevertebral fluid or swelling. No visible canal hematoma. Disc levels: Cervical spondylosis with multilevel disc space narrowing, disc bulges/central disc protrusions, uncovertebral hypertrophy and facet arthropathy. Disc space narrowing is greatest at C4-C5 and C5-C6 (moderate to advanced at these levels). Multilevel spinal canal narrowing. Most notably at C5-C6, a central disc protrusion contributes to at least moderate spinal canal stenosis. Multilevel bony neural foraminal narrowing. Upper chest: No consolidation within the imaged lung apices. No visible pneumothorax. Acute findings called by telephone at the time of interpretation on 04/02/2023 at 1:49 pm to provider JULIE HAVILAND , who verbally acknowledged these results. IMPRESSION: CT head: 1. Acute subdural hematoma along the left aspect of the falx, measuring up to 3 mm in thickness. 2. Large right anterior scalp, forehead and periorbital hematoma. 3. Forehead laceration. 4. Cerebral atrophy. CT maxillofacial: 1. No evidence of an acute maxillofacial fracture. 2. Large right anterior scalp, forehead and periorbital hematoma. 3. Forehead laceration. 4.  Paranasal sinus disease as described. CT cervical spine: 1. Acute displaced fracture of the left C1 transverse process (lateral to the foramen transversarium). 2. Acute mildly displaced fracture through the left C5 foramen transversarium. A CTA of the neck is recommended to exclude traumatic injury to the left vertebral artery. 3. Acute, displaced fracture of the C5 spinous process. 4. Mild grade 1 spondylolisthesis at C4-C5, C5-C6 and T2-T3. 5. Cervical spondylosis as described within the body of the report. Spinal canal stenosis is greatest at C5-C6 (at least moderate at this level). Multilevel bony neural foraminal narrowing. Electronically Signed   By: Jackey Loge D.O.   On: 04/02/2023 13:51   DG Pelvis Portable Result Date: 04/02/2023 CLINICAL DATA:  Trauma.  Fall. EXAM: PORTABLE PELVIS 1-2 VIEWS COMPARISON:  None Available. FINDINGS: Pelvis is intact with normal and symmetric sacroiliac joints. No acute fracture or dislocation. No aggressive osseous lesion. Visualized sacral arcuate lines are unremarkable. Unremarkable symphysis pubis. There are mild degenerative changes of bilateral hip joints without significant joint space narrowing. Osteophytosis of the superior acetabulum. No radiopaque foreign bodies. IMPRESSION: No acute osseous abnormality of the pelvis. Electronically Signed   By: Jules Schick M.D.   On: 04/02/2023 13:23   DG Chest Port 1 View Result Date: 04/02/2023 CLINICAL DATA:  Trauma. EXAM: PORTABLE CHEST 1 VIEW COMPARISON:  12/09/2022 FINDINGS: Low lung volume. Bilateral lung fields are clear. Bilateral costophrenic angles are clear. Normal cardio-mediastinal silhouette. No acute osseous abnormalities. No acute displaced rib fracture seen. The soft tissues are within normal limits. IMPRESSION: No active disease. Electronically Signed   By: Jules Schick M.D.   On: 04/02/2023 13:22    Anti-infectives: Anti-infectives (From admission, onward)    None         Assessment/Plan Fall down embankment SDH - Dr. Franky Macho has seen.  No acute intervention.  No need to reverse plavix. Repeat CT H stable Scalp laceration/hematoma - repaired by EDP C1 TVP fx, LC5 foramen fx, C5 spinous process fx - collar per Dr. Franky Macho.  CTA focally abnormal at right vert artery concerning for possible grade 2 blunt cerebrovascular injury, but patient already on ASA and plavix. Therapies ordered "Hemoptysis" - ENT Dr. Jearld Fenton evaluated and cauterized bleeding site in nose. No further bleeding  HTN HLD CKD CHF CAD, stents on plavix Pain control - added further po pain med options Scattered abrasions -  bacitracin and local wound care  FEN: npo for MBS per SLP. Can have solid diet from abdominal standpoint ID: none currently VTE: plavix   I reviewed Consultant NSGY notes, hospitalist notes, last 24 h vitals and pain scores, last 48 h intake and output, last 24 h labs and trends, and last 24 h imaging results.    LOS: 1 day   Eric Form, [facility] Surgery 04/03/2023, 10:04 AM Please see Amion for pager number during day hours 7:00am-4:30pm

## 2023-04-03 NOTE — Progress Notes (Signed)
   04/03/23 1253  Stool Characteristics  Bowel Incontinence No  Stool Type Type 6 (Mushy consistency with ragged edges)  Has the patient had three Type 7 stools in the last 24 hours? No  Stool Descriptors Black;Manson Passey (old clots)  Stool Amount Large  Stool Source Rectum   MD team notified and new order received. Pt stable and sleeping in bed with call light within reach. Spouse remains at bedside. Dionne Bucy RN

## 2023-04-03 NOTE — Progress Notes (Signed)
 Patient ID: Shawn Andrews, male   DOB: 01/03/1929, 89 y.o.   MRN: [id number] BP (!) 118/49 (BP Location: Right Arm)   Pulse 80   Temp 98.8 F (37.1 C) (Axillary)   Resp (!) 34   Ht 5\' 10"  (1.778 m)   Wt 89.4 kg   SpO2 98%   BMI 28.27 kg/m  Repeat head CT reviewed, falcine mass is unchanged. No shift.

## 2023-04-03 NOTE — Evaluation (Signed)
 Clinical/Bedside Swallow Evaluation Patient Details  Name: Shawn Andrews MRN: [id number] Date of Birth: [birth year]  Today's Date: 04/03/2023 Time: SLP Start Time (ACUTE ONLY): 0941 SLP Stop Time (ACUTE ONLY): 1004 SLP Time Calculation (min) (ACUTE ONLY): 23 min  Past Medical History:  Past Medical History:  Diagnosis Date   Abnormal drug screen 06/22/2014   inapprop neg xanax rpt 3 mo (06/2014)   BPH (benign prostatic hypertrophy) 01/21/1998   has had 3 biopsies in past ([facility]) decided to stop PSA/DRE   CHF (congestive heart failure) (HCC)    Coronary artery disease    Hyperlipidemia 01/21/2002   Hypertension 05/22/2003   Left lumbar radiculopathy    Osteoarthritis 01/22/1988   knees, lumbar spondylosis and listhesis   Past Surgical History:  Past Surgical History:  Procedure Laterality Date   CATARACT EXTRACTION  2013   bilateral   CORONARY ANGIOGRAPHY N/A 03/29/2022   Procedure: CORONARY ANGIOGRAPHY;  Surgeon: Alwyn Pea, MD;  Location: [facility] INVASIVE CV LAB;  Service: Cardiovascular;  Laterality: N/A;   CORONARY STENT INTERVENTION N/A 03/27/2022   Procedure: CORONARY STENT INTERVENTION;  Surgeon: Alwyn Pea, MD;  Location: [facility] INVASIVE CV LAB;  Service: Cardiovascular;  Laterality: N/A;   CORONARY STENT INTERVENTION N/A 03/29/2022   Procedure: CORONARY STENT INTERVENTION;  Surgeon: Alwyn Pea, MD;  Location: [facility] INVASIVE CV LAB;  Service: Cardiovascular;  Laterality: N/A;   ESI Left 08/2014, 11/2014, 08/2015   L S1, L L5/S1 transforaminal ESI; L4/5 L5/S1 zygapophysial injections, L S1 transforaminal ([facility])   FINGER SURGERY     right middle, partial traumatic amputation   IR CATHETER TUBE CHANGE  03/02/2021   KNEE ARTHROSCOPY  1990   right   KNEE SURGERY  04/2006   right partial knee replacement in [place] - rec ppx abx for any invasive procedure   LEFT HEART CATH AND CORONARY ANGIOGRAPHY N/A 01/18/2021   Procedure: LEFT HEART CATH AND CORONARY  ANGIOGRAPHY;  Surgeon: Lamar Blinks, MD;  Location: [facility] INVASIVE CV LAB;  Service: Cardiovascular;  Laterality: N/A;   LEFT HEART CATH AND CORONARY ANGIOGRAPHY N/A 03/27/2022   Procedure: LEFT HEART CATH AND CORONARY ANGIOGRAPHY;  Surgeon: Alwyn Pea, MD;  Location: [facility] INVASIVE CV [facility];  Service: Cardiovascular;  Laterality: N/A;   LEFT HEART CATH AND CORONARY ANGIOGRAPHY N/A 12/11/2022   Procedure: LEFT HEART CATH AND CORONARY ANGIOGRAPHY;  Surgeon: Marcina Millard, MD;  Location: [facility] INVASIVE CV LAB;  Service: Cardiovascular;  Laterality: N/A;   HPI:  [name] is a 89 yo male presenting to ED 3/12 after a mechanical fall down a 10 ft embankment with multiple facial abrasions as well as a scalp laceration and hematoma. Imaging reveals 4 mm para falcine SDH without midline shift and displaced fxs of L C1 and C5 in addition to stenosis at C5-6 with multilevel bony narrowing. Significant epistaxis requiring cauterization by ENT. Esophagram 12/14/22 shows mild gastroesophageal reflux but no stricture, mass, or hiatal hernia. Pt's wife reports this is managed well with medication.   PMH includes CAD s/p stent placement 2024 on ASA and plavix, CHF, BPH requiring SP tube placement, HTN, CKD, HLD    Assessment / Plan / Recommendation  Clinical Impression  Pt is lethargic but rouses easily to repeated verbal stimuli. He presents with a wet vocal quality at baseline, suspected to be contributed to by significant epistaxis previous date. His cough lacks crispness and force. SLP and RN performend thorough oral care, removing dried bloody secretions from oral  surfaces. Small sips of water resulted in multiple swallows followed by immediate and prolonged coughing, requiring suctioning to aid in clearance. Ice chips and purees did not result in any further coughing, but pt continued to use multiple swallows to clear the bolus. Note cervical spine fx and spondylosis on CT cervical spine which  could further impact swallowing. Recommend pt remain NPO except 2-3 ice chips after oral care pending completion of an MBS. Priority meds may be given crushed in puree. SLP Visit Diagnosis: Dysphagia, unspecified (R13.10)    Aspiration Risk  Moderate aspiration risk    Diet Recommendation NPO except meds;Ice chips PRN after oral care    Medication Administration: Crushed with puree    Other  Recommendations Oral Care Recommendations: Oral care QID;Oral care prior to ice chip/H20;Staff/trained caregiver to provide oral care    Recommendations for follow up therapy are one component of a multi-disciplinary discharge planning process, led by the attending physician.  Recommendations may be updated based on patient status, additional functional criteria and insurance authorization.  Follow up Recommendations Acute inpatient rehab (3hours/day)      Assistance Recommended at Discharge    Functional Status Assessment Patient has had a recent decline in their functional status and demonstrates the ability to make significant improvements in function in a reasonable and predictable amount of time.  Frequency and Duration min 2x/week  2 weeks       Prognosis Prognosis for improved oropharyngeal function: Good Barriers to Reach Goals: Cognitive deficits;Severity of deficits      Swallow Study   General HPI: Shawn Andrews is a 89 yo male presenting to ED 3/12 after a mechanical fall down a 10 ft embankment with multiple facial abrasions as well as a scalp laceration and hematoma. Imaging reveals 4 mm para falcine SDH without midline shift and displaced fxs of L C1 and C5 in addition to stenosis at C5-6 with multilevel bony narrowing. Significant epistaxis requiring cauterization by ENT. Esophagram 12/14/22 shows mild gastroesophageal reflux but no stricture, mass, or hiatal hernia. Pt's wife reports this is managed well with medication.   PMH includes CAD s/p stent placement 2024 on ASA and  plavix, CHF, BPH requiring SP tube placement, HTN, CKD, HLD Type of Study: Bedside Swallow Evaluation Previous Swallow Assessment: none in chart Diet Prior to this Study: NPO Temperature Spikes Noted: No Respiratory Status: Nasal cannula History of Recent Intubation: No Behavior/Cognition: Alert;Cooperative;Requires cueing Oral Cavity Assessment: Dried secretions Oral Care Completed by SLP: Yes Oral Cavity - Dentition: Dentures, top;Missing dentition Vision: Functional for self-feeding Self-Feeding Abilities: Total assist Patient Positioning: Upright in bed Baseline Vocal Quality: Wet Volitional Cough: Weak;Wet Volitional Swallow: Able to elicit    Oral/Motor/Sensory Function Overall Oral Motor/Sensory Function: Within functional limits   Ice Chips Ice chips: Within functional limits Presentation: Spoon   Thin Liquid Thin Liquid: Impaired Presentation: Straw;Cup;Spoon Pharyngeal  Phase Impairments: Suspected delayed Swallow;Multiple swallows;Wet Vocal Quality;Cough - Immediate    Nectar Thick Nectar Thick Liquid: Not tested   Honey Thick Honey Thick Liquid: Not tested   Puree Puree: Impaired Presentation: Spoon Pharyngeal Phase Impairments: Suspected delayed Swallow;Multiple swallows   Solid     Solid: Not tested      Gwynneth Aliment, M.A., CF-SLP Speech Language Pathology, Acute Rehabilitation Services  Secure Chat preferred [phone]  04/03/2023,10:21 AM

## 2023-04-03 NOTE — Plan of Care (Signed)
 ?  Problem: Clinical Measurements: ?Goal: Will remain free from infection ?Outcome: Progressing ?  ?

## 2023-04-03 NOTE — Progress Notes (Signed)
 HD#1 SUBJECTIVE:  Patient Summary: Shawn Andrews is a 89 y.o. past medical history of CAD s/p PCI and stent LAD with DES on aspirin and plavix, HFrEF, tremor, hypertension, hyperlipidemia, CKD stage IIIb, BPH presents to ED today for mechanical fall, sustained multiple cervical fractures, subdural hematoma and facial abrasions.   Overnight Events: None   Interim History: Patient is evaluated at bedside with wife present. Presently, denies any CP, SOB or abdominal pain. He does not have any concerns at this time.   OBJECTIVE:  Vital Signs: Vitals:   04/02/23 2300 04/03/23 0149 04/03/23 0300 04/03/23 0727  BP: (!) 110/58 (!) 130/59 (!) 118/49 92/64  Pulse: 79  80   Resp: (!) 35  (!) 34 (!) 24  Temp: 98.2 F (36.8 C) 98.7 F (37.1 C) 98.8 F (37.1 C)   TempSrc: Axillary Axillary Axillary   SpO2:   98%   Weight:      Height:       Supplemental O2: Nasal Cannula SpO2: 98 % O2 Flow Rate (L/min): 2 L/min  Filed Weights   04/02/23 1123  Weight: 89.4 kg     Intake/Output Summary (Last 24 hours) at 04/03/2023 0729 Last data filed at 04/03/2023 0434 Gross per 24 hour  Intake 864 ml  Output 700 ml  Net 164 ml   Net IO Since Admission: 164 mL [04/03/23 0729]  Physical Exam: Physical Exam  General: Laying in bed, alert, answering questions appropriately, no acute distress. HEENT: Positive for facial abrasions, dried blood around the nares, right forehead laceration dressed, right periorbital edema and ecchymosis.  C-collar in place. Cardiovascular: Regular rate, no murmurs appreciated Pulmonary: Coarse crackles Abdomen: Soft, nontender, nondistended. MSK: No lower extremity edema bilaterally  Patient Lines/Drains/Airways Status     Active Line/Drains/Airways     Name Placement date Placement time Site Days   Peripheral IV 04/02/23 18 G Left Antecubital 04/02/23  1119  Antecubital  1   Suprapubic Catheter Latex 16 Fr. 08/10/22  1158  Latex  236              ASSESSMENT/PLAN:  Assessment: Principal Problem:   Fall Active Problems:   Essential hypertension   Benign prostatic hyperplasia   Chronic kidney disease, stage 3b (HCC)   CAD (coronary artery disease)   Suprapubic catheter (HCC)   Dyslipidemia   Subdural hematoma (HCC)   Cervical transverse process fracture (HCC)   Fracture of cervical spinous process (HCC)   Plan:  Mechanical fall Cervical fractures at C1 and C5 3 mm para falcine subdural hematoma  Large Forehead Laceration and Hematoma- repaired  Facial Abrasions  Presented to ED after mechanical fall this AM without any prodromal symptoms sustaining subdural hematoma, lateral anterior scalp, forehead and periorbital hematomas, forehead laceration, acute displaced fractures of C1 and C5 and facial abrasions. EDP provider repaired forehead laceration, that appears to be still draining. Repeat CTH without any acute changes. NSGY and Trauma on board. At bedside, AO x 4.  - No surgical intervention at this time per NSGY - If any changes to mental status, get STAT CTH and notify NSGY  - PT/OT when able    Epistaxis s/p R nares cautery  CT Maxillofacial without any evidence of acute maxillofacial fracture. ENT consulted 3/12 for posterior oropharynx bleed. S/p R nare cautery. Stable.    Acute blood loss anemia  Hgb dropped from 10.9 to 7.5 on 3/12; s/p 1 PRBC. - Repeat Hgb 7.5 --> 8.0 - No signs of active bleed -  Monitor CBC   Leukocytosis WBC 18.6 (16.3) likely due to above. Afebrile.  - Trend   Concern for black stool RN messaged about black stool with old blood clots today. Low suspicion for GI bleed at this time. Likely from digested oropharyn bleed from yesterday. VS stable. RN notified to monitor for hematochezia.  - Follow up on PM CBC - Monitor for any future bloody bowels   Dysphagia SLP evaluated and recommended NPO with medications crushed in puree. - MBS today   CAD NSTEMI s/p stent to in-stent  restenosis of mid LAD (03/2022)  Two overlapping DES stent to proximal/mid LAD (09/2020), stent to proximal RCA.  - Continue Plavix 75 mg daily    Chronic HFrEF Hypertension Last ECHO 11/2022 showed EF 45 to 50% with regional wall motion abnormalities and normal RV function. Home medications consist of metoprolol 25 mg twice daily, Imdur 60 mg twice daily, hydralazine 50 mg 3 times daily, Lasix 20 mg every other day. -Hold home GDMT in the setting of hypotension and active bleed   Hyperlipidemia,  Continue home medication Crestor   CKD stage 3a BPH Suprapubic catheter 2/2 urinary retention  Follows Urology and had it replaced on 03/12/2023. Scr 1.85, baseline around 1.8  - UA with large leukocytes and rare bacteria, consistent with previous UA, likely chronic colonization.    - Avoid nephrotoxic agents   Essential tremors  -Continue home medication primidone 50 mg, 2 tablets by mouth 3 times daily for tremors   GERD -Continue home medication Protonix 40 mg daily and famotidine 20 mg daily   Diverticulosis CTAP noted diverticulosis of descending and sigmoid colon is noted without inflammation. Bowel regimen.   Best Practice: Diet: NPO with meds crushed in puree  IVF: Fluids: None, Rate: None VTE: SCDs Start: 04/02/23 1630 Code: Full AB: None Family Contact: Wife, at bedside. DISPO: Anticipated discharge  3-6 days  to  pending    Signature: Jeral Pinch, D.O.  Internal Medicine Resident, PGY-1 [facility]  Pager: [phone] 7:29 AM, 04/03/2023   Please contact the on call pager after 5 pm and on weekends at [phone].

## 2023-04-03 NOTE — Progress Notes (Signed)
 Pt off unit via bed to radiology for a barium swallow. Dionne Bucy RN

## 2023-04-04 ENCOUNTER — Inpatient Hospital Stay (HOSPITAL_COMMUNITY)

## 2023-04-04 DIAGNOSIS — I25709 Atherosclerosis of coronary artery bypass graft(s), unspecified, with unspecified angina pectoris: Secondary | ICD-10-CM

## 2023-04-04 DIAGNOSIS — R7989 Other specified abnormal findings of blood chemistry: Secondary | ICD-10-CM | POA: Diagnosis not present

## 2023-04-04 DIAGNOSIS — I2489 Other forms of acute ischemic heart disease: Secondary | ICD-10-CM

## 2023-04-04 LAB — URINALYSIS, ROUTINE W REFLEX MICROSCOPIC
Bilirubin Urine: NEGATIVE
Glucose, UA: NEGATIVE mg/dL
Hgb urine dipstick: NEGATIVE
Ketones, ur: NEGATIVE mg/dL
Nitrite: NEGATIVE
Protein, ur: 30 mg/dL — AB
Specific Gravity, Urine: 1.023 (ref 1.005–1.030)
pH: 7 (ref 5.0–8.0)

## 2023-04-04 LAB — BASIC METABOLIC PANEL
Anion gap: 15 (ref 5–15)
BUN: 87 mg/dL — ABNORMAL HIGH (ref 8–23)
CO2: 15 mmol/L — ABNORMAL LOW (ref 22–32)
Calcium: 8.4 mg/dL — ABNORMAL LOW (ref 8.9–10.3)
Chloride: 111 mmol/L (ref 98–111)
Creatinine, Ser: 2.34 mg/dL — ABNORMAL HIGH (ref 0.61–1.24)
GFR, Estimated: 25 mL/min — ABNORMAL LOW (ref >60)
Glucose, Bld: 181 mg/dL — ABNORMAL HIGH (ref 70–99)
Potassium: 4.5 mmol/L (ref 3.5–5.1)
Sodium: 141 mmol/L (ref 135–145)

## 2023-04-04 LAB — CBC
HCT: 23.9 % — ABNORMAL LOW (ref 39.0–52.0)
Hemoglobin: 8.3 g/dL — ABNORMAL LOW (ref 13.0–17.0)
MCH: 30.4 pg (ref 26.0–34.0)
MCHC: 34.7 g/dL (ref 30.0–36.0)
MCV: 87.5 fL (ref 80.0–100.0)
Platelets: 244 10*3/uL (ref 150–400)
RBC: 2.73 MIL/uL — ABNORMAL LOW (ref 4.22–5.81)
RDW: 14.3 % (ref 11.5–15.5)
WBC: 21.9 10*3/uL — ABNORMAL HIGH (ref 4.0–10.5)
nRBC: 0 % (ref 0.0–0.2)

## 2023-04-04 LAB — TROPONIN I (HIGH SENSITIVITY)
Troponin I (High Sensitivity): 421 ng/L (ref <18)
Troponin I (High Sensitivity): 2714 ng/L (ref <18)

## 2023-04-04 LAB — CK: Total CK: 196 U/L (ref 49–397)

## 2023-04-04 MED ORDER — SODIUM CHLORIDE 0.9% FLUSH: 10.0000 mL | INTRAVENOUS | Status: DC | PRN

## 2023-04-04 MED ORDER — SUCRALFATE 1 GM/10ML PO SUSP
1.0000 g | Freq: Three times a day (TID) | ORAL | Status: DC | PRN
  Administered 2023-04-04 – 2023-04-07 (×9): 1 g via ORAL
  Filled 2023-04-04 (×9): qty 10

## 2023-04-04 MED ORDER — SODIUM CHLORIDE 0.9 % IV SOLN
2.0000 g | INTRAVENOUS | Status: DC
  Administered 2023-04-04 – 2023-04-07 (×4): 2 g via INTRAVENOUS
  Filled 2023-04-04 (×4): qty 20

## 2023-04-04 MED ORDER — SODIUM CHLORIDE 0.9% FLUSH
10.0000 mL | Freq: Two times a day (BID) | INTRAVENOUS | Status: DC
  Administered 2023-04-04 – 2023-04-08 (×7): 10 mL

## 2023-04-04 MED ORDER — METRONIDAZOLE 500 MG/100ML IV SOLN
500.0000 mg | Freq: Two times a day (BID) | INTRAVENOUS | Status: DC
  Administered 2023-04-04 – 2023-04-06 (×4): 500 mg via INTRAVENOUS
  Filled 2023-04-04 (×4): qty 100

## 2023-04-04 MED ORDER — QUETIAPINE FUMARATE 50 MG PO TABS
25.0000 mg | ORAL_TABLET | Freq: Every day | ORAL | Status: DC
  Administered 2023-04-04 – 2023-04-07 (×4): 25 mg via ORAL
  Filled 2023-04-04 (×4): qty 1

## 2023-04-04 NOTE — Care Management Important Message (Signed)
 Important Message  Patient Details  Name: Shawn Andrews MRN: [id number] Date of Birth: 1928-10-21   Important Message Given:  Yes - Medicare IM     Sherilyn Banker 04/04/2023, 3:10 PM

## 2023-04-04 NOTE — Progress Notes (Signed)
   04/04/23 1516  TOC Brief Assessment  Insurance and Status Reviewed  Patient has primary care physician Yes  Home environment has been reviewed home w/ wife  Prior level of function: independent  Prior/Current Home Services No current home services  Social Drivers of Health Review SDOH reviewed no interventions necessary  Readmission risk has been reviewed Yes  Transition of care needs no transition of care needs at this time    Pt s/p fall at home, CIR consulted, family thinking about CIR vs SNF.  We will continue to monitor patient advancement through interdisciplinary progression rounds. If new patient transition needs arise, please place a TOC consult.

## 2023-04-04 NOTE — Progress Notes (Signed)

## 2023-04-04 NOTE — Progress Notes (Signed)
 Inpatient Rehab Admissions Coordinator:    I met with pt.', wife, and grandson at bedside to discuss potential CIR admit. They are interested, but wife thinks it would be better to do rehab in a SNF in [place] where she can more easily visit. Provided an explanation of CIR vs SNF. They would like to think about it and get back to me. I will follow up with them this weekend.   Megan Salon, MS, CCC-SLP Rehab Admissions Coordinator  [phone] (celll) [phone] (office)

## 2023-04-04 NOTE — Progress Notes (Signed)
 Speech Language Pathology Treatment: Dysphagia  Patient Details Name: Shawn Andrews MRN: [id number] DOB: 12-28-1928 Today's Date: 04/04/2023 Time: 1010-1040 SLP Time Calculation (min) (ACUTE ONLY): 30 min  Assessment / Plan / Recommendation Clinical Impression  Reviewed MBS images with pt's wife, Shawn Andrews, and his son, Shawn Andrews. Discussed possibility that current presentation is representative of acute on chronic dysphagia due to changes to pt's cervical spine. Pt presents with increasing lethargy this date, requiring more frequent cueing to sustain alertness for PO trials. RN and pt's family report good intake with breakfast, although note some difficulty with mastication secondary to pain. They also state he has not consistently been wearing his dentures for PO intake. SLP placed pt's upper dentures and provided trials of nectar thick liquids and Dys 3 solids. He exhibits prolonged mastication and oral residue with solids but is able to use a lingual sweep and initiate subswallows to clear if given Mod verbal cueing. Recommend downgrading solids to Dys 2 and continuing nectar thick liquids. SLP will continue following.    HPI HPI: Shawn Andrews is a 89 yo male presenting to ED 3/12 after a mechanical fall down a 10 ft embankment with multiple facial abrasions as well as a scalp laceration and hematoma. Imaging reveals 4 mm para falcine SDH without midline shift and displaced fxs of L C1 and C5 in addition to stenosis at C5-6 with multilevel bony narrowing. Significant epistaxis requiring cauterization by ENT. Esophagram 12/14/22 shows mild gastroesophageal reflux but no stricture, mass, or hiatal hernia. Pt's wife reports this is managed well with medication.   PMH includes CAD s/p stent placement 2024 on ASA and plavix, CHF, BPH requiring SP tube placement, HTN, CKD, HLD      SLP Plan  Continue with current plan of care      Recommendations for follow up therapy are one component of a  multi-disciplinary discharge planning process, led by the attending physician.  Recommendations may be updated based on patient status, additional functional criteria and insurance authorization.    Recommendations  Diet recommendations: Dysphagia 2 (fine chop);Nectar-thick liquid Liquids provided via: Cup;Straw Medication Administration: Crushed with puree Supervision: Staff to assist with self feeding;Intermittent supervision to cue for compensatory strategies;Trained caregiver to feed patient Compensations: Minimize environmental distractions;Slow rate;Small sips/bites Postural Changes and/or Swallow Maneuvers: Seated upright 90 degrees                  Oral care BID   Frequent or constant Supervision/Assistance Dysphagia, oropharyngeal phase (R13.12)     Continue with current plan of care     Gwynneth Aliment, M.A., CF-SLP Speech Language Pathology, Acute Rehabilitation Services  Secure Chat preferred [phone]   04/04/2023, 11:40 AM

## 2023-04-04 NOTE — Progress Notes (Signed)
 Patient ID: Shawn Andrews, male   DOB: 03-10-1928, 89 y.o.   MRN: [id number] BP 117/61 (BP Location: Left Arm)   Pulse 89   Temp 98.7 F (37.1 C) (Oral)   Resp (!) 22   Ht 5\' 10"  (1.778 m)   Wt 89.4 kg   SpO2 100%   BMI 28.27 kg/m  Alert and oriented x 4 Moving all extremities No neurological changes noted since admission.  No new recommendations.

## 2023-04-04 NOTE — Evaluation (Addendum)
 Physical Therapy Evaluation Patient Details Name: Shawn Andrews MRN: [id number] DOB: [birth year] Today's Date: 04/04/2023  History of Present Illness  Shawn Andrews is a 89 yo male presenting to ED 3/12 after a mechanical fall down a 10 ft embankment with multiple facial abrasions as well as a scalp laceration and hematoma. Imaging reveals 4 mm para falcine SDH without midline shift and displaced fxs of L C1 and C5 in addition to stenosis at C5-6 with multilevel bony narrowing. Significant epistaxis requiring cauterization by ENT. Esophagram 12/14/22 shows mild gastroesophageal reflux but no stricture, mass, or hiatal hernia. Pt's wife reports this is managed well with medication.   PMH includes CAD s/p stent placement 2024 on ASA and plavix, CHF, BPH requiring SP tube placement, HTN, CKD, HLD  Clinical Impression  PTA, pt lives with his spouse and is independent and active; pt rides his stationary bike [facility]. Pt presents with decreased functional mobility secondary to impaired standing balance, LUE weakness/decreased sensation, visual deficit. Pt requiring minimal assist for ambulation to and from bathroom without an assistive device. Has drift and running into obstacles/walls on R. Max cues provided for environmental navigation. Resting HR 112, max HR 137. Pt reports good pain control throughout. Patient will benefit from intensive inpatient follow-up therapy, >3 hours/day in order to address deficits and maximize functional independence. Of note, pt family/spouse with strong desire for pt to rehabilitate in [place], but will consult with CSW for rehab venue assist.       If plan is discharge home, recommend the following: A little help with walking and/or transfers;A little help with bathing/dressing/bathroom;Assistance with cooking/housework;Assist for transportation;Direct supervision/assist for medications management   Can travel by private vehicle         Equipment Recommendations Other (comment) (TBA)  Recommendations for Other Services  Rehab consult    Functional Status Assessment Patient has had a recent decline in their functional status and demonstrates the ability to make significant improvements in function in a reasonable and predictable amount of time.     Precautions / Restrictions Precautions Precautions: Fall;Cervical Precaution Booklet Issued: No Precaution/Restrictions Comments: Watch HR Required Braces or Orthoses: Cervical Brace Cervical Brace: Hard collar;At all times Restrictions Weight Bearing Restrictions Per Provider Order: No      Mobility  Bed Mobility               General bed mobility comments: OOB walking to bathroom with RN upon entry    Transfers Overall transfer level: Needs assistance Equipment used: None Transfers: Sit to/from Stand Sit to Stand: Contact guard assist           General transfer comment: Assist to steady    Ambulation/Gait Ambulation/Gait assistance: Min assist Gait Distance (Feet): 15 Feet Assistive device: None Gait Pattern/deviations: Step-through pattern, Decreased stride length, Drifts right/left, Narrow base of support Gait velocity: decreased Gait velocity interpretation: <1.31 ft/sec, indicative of household ambulator   General Gait Details: Pt requiring consistent minA for balance, with drift towards right, bumping into wall.  Stairs            Wheelchair Mobility     Tilt Bed    Modified Rankin (Stroke Patients Only)       Balance Overall balance assessment: Needs assistance Sitting-balance support: Feet supported Sitting balance-Leahy Scale: Fair     Standing balance support: No upper extremity supported, During functional activity Standing balance-Leahy Scale: Poor Standing balance comment: External support from therapist  Pertinent Vitals/Pain Pain Assessment Pain Assessment: No/denies  pain Pain Location: Pt stating, "I'm not having much pain."    Home Living Family/patient expects to be discharged to:: Private residence Living Arrangements: Spouse/significant other Available Help at Discharge: Family;Available 24 hours/day Type of Home: House Home Access: Level entry       Home Layout: One level Home Equipment: Toilet riser;Cane - single point;Rollator (4 wheels)      Prior Function Prior Level of Function : Independent/Modified Independent             Mobility Comments: Rides stationary bike 30 minutes/day, [facility], walking and upper body strengthening ADLs Comments: independent     Extremity/Trunk Assessment   Upper Extremity Assessment Upper Extremity Assessment: Defer to OT evaluation    Lower Extremity Assessment Lower Extremity Assessment: RLE deficits/detail;LLE deficits/detail RLE Deficits / Details: Grossly 4/5 LLE Deficits / Details: grossly 4/5       Communication   Communication Communication: Impaired Factors Affecting Communication: Hearing impaired    Cognition Arousal: Alert Behavior During Therapy: Flat affect   PT - Cognitive impairments: Difficult to assess, Safety/Judgement, Rancho level Difficult to assess due to: Hard of hearing/deaf                 [place]ancho Levels of Cognitive Functioning Rancho Los Amigos Scales of Cognitive Functioning: Automatic, Appropriate: Minimal Assistance for Daily Living Skills Ranch[place] Los Amigos Scales of Cognitive Functioning: Automatic, Appropriate: Minimal Assistance for Daily Living Skills [VII] PT - Cognition Comments: Pt HOH and does not have hearing aids. Pt able to recall all aspects of fall/accident and reports "I did not lose consciousness." Pt spouse states pt has been appropriate in conversation with her and oriented. Pt with emerging awareness of deficits. Bumped into wall on R without adjustment         Cueing       General Comments       Exercises     Assessment/Plan    PT Assessment Patient needs continued PT services  PT Problem List Decreased strength;Decreased activity tolerance;Decreased balance;Decreased mobility;Decreased safety awareness;Cardiopulmonary status limiting activity;Impaired sensation       PT Treatment Interventions DME instruction;Gait training;Functional mobility training;Therapeutic activities;Therapeutic exercise;Balance training;Patient/family education    PT Goals (Current goals can be found in the Care Plan section)  Acute Rehab PT Goals Patient Stated Goal: pt spouse would like him to rehab in [place] PT Goal Formulation: With patient/family Time For Goal Achievement: 04/18/23 Potential to Achieve Goals: Good    Frequency Min 3X/week     Co-evaluation               AM-PAC PT "6 Clicks" Mobility  Outcome Measure Help needed turning from your back to your side while in a flat bed without using bedrails?: A Little Help needed moving from lying on your back to sitting on the side of a flat bed without using bedrails?: A Little Help needed moving to and from a bed to a chair (including a wheelchair)?: A Little Help needed standing up from a chair using your arms (e.g., wheelchair or bedside chair)?: A Little Help needed to walk in hospital room?: A Little Help needed climbing 3-5 steps with a railing? : A Lot 6 Click Score: 17    End of Session Equipment Utilized During Treatment: Gait belt;Cervical collar Activity Tolerance: Patient tolerated treatment well Patient left: in chair;with call bell/phone within reach;with chair alarm set;with family/visitor present Nurse Communication: Mobility status PT  Visit Diagnosis: Unsteadiness on feet (R26.81);Difficulty in walking, not elsewhere classified (R26.2)    Time: 4401-0272 PT Time Calculation (min) (ACUTE ONLY): 45 min   Charges:   PT Evaluation $PT Eval Moderate Complexity: 1 Mod PT Treatments $Therapeutic Activity:  23-37 mins PT General Charges $$ ACUTE PT VISIT: 1 Visit         Lillia Pauls, PT, DPT Acute Rehabilitation Services Office [phone]   Norval Morton 04/04/2023, 11:10 AM

## 2023-04-04 NOTE — Progress Notes (Addendum)
 Received sign out from day team around 1900 that this patient with a hx of CAD and two NSTEMIs in 2024 was having substernal chest pain with associated diaphoresis. Upon their evaluation, he was tachycardic, BP stable, and his chest pain resolved with SL nitroglycerin. EKG at that time did not show any ST elevations.   Troponin level resulted and was initially elevated to 99, then trended up to 170 > 253 >421. The patient is chest pain free at this time and his vital signs are stable. I discussed this with the on-call cardiologist, Dr. Orson Aloe, who stated that subdural hematomas can falsely elevate troponin. Without active chest pain, a troponin >1k, and/or new EKG changes, there is no change in management that needs to be done at this time.

## 2023-04-04 NOTE — Consult Note (Signed)
 Cardiology Consultation   Patient ID: [name] MRN: [id number]; DOB: May 18, 1928  Admit date: 04/02/2023 Date of Consult: 04/04/2023  PCP:  Mick Sell, MD   [facility] Providers Cardiologist:  None        Patient Profile:   Shawn Andrews is a 89 y.o. male with a significant history of multivessel coronary artery disease status post drug-eluting stents, heart failure with reduced ejection fraction, stage IIIb chronic kidney disease, hypertension, gastro-esophageal reflux disease, hypercholesteremia, prior deep vein thrombosis, benign prostate hypertrophy who is being seen 04/04/2023 for the evaluation of chest pain elevated HS-troponin at the request of Dr. Ned Card.  History of Present Illness:   Shawn Andrews initially presented to our emergency department 2 days ago after suffering a fall at home while working in his backyard.  Patient suffered multiple traumatic injuries that included facial lacerations, hemoptysis requiring nasal cauterization, displaced C1 fracture, acute subdural hematoma, and acute blood loss anemia requiring packed red blood cells. Patient's family did report bloody stools recently per documentation.  Patient reports yesterday evening he noticed chest pain that he describes as 'acid reflux' feeling.  The pain reportedly felt like a burning sensation and associated with sweating.  His chest pain only lasted a few minutes and reportedly improved with sublingual nitroglycerin.  He denies any other associated symptoms such as dizziness, lightheadedness, nausea, or shortness of breath.  Per wife, patient takes sucralfate at home for similar pain but this was never restarted until requested after his chest pain event earlier.  He denies any ongoing chest pain and chest he experienced earlier is different than the chest pain prior related to prior heart attacks.    ECG done at the time of his chest pain was unchanged from prior.  HS-troponin was initially  99 ng/L and went to 2,714 ng/L.  Of note, patient had a coronary angiography approximately 4 months ago that demonstrated multi-vessel coronary artery disease but patent stents.  Based on note documentation, they decided not to proceed with any additional percutaneous interventions.     Past Medical History:  Diagnosis Date   Abnormal drug screen 06/22/2014   inapprop neg xanax rpt 3 mo (06/2014)   BPH (benign prostatic hypertrophy) 01/21/1998   has had 3 biopsies in past ([facility]) decided to stop PSA/DRE   CHF (congestive heart failure) (HCC)    Coronary artery disease    Hyperlipidemia 01/21/2002   Hypertension 05/22/2003   Left lumbar radiculopathy    Osteoarthritis 01/22/1988   knees, lumbar spondylosis and listhesis    Past Surgical History:  Procedure Laterality Date   CATARACT EXTRACTION  2013   bilateral   CORONARY ANGIOGRAPHY N/A 03/29/2022   Procedure: CORONARY ANGIOGRAPHY;  Surgeon: Alwyn Pea, MD;  Location: [facility];  Service: Cardiovascular;  Laterality: N/A;   CORONARY STENT INTERVENTION N/A 03/27/2022   Procedure: CORONARY STENT INTERVENTION;  Surgeon: Alwyn Pea, MD;  Location: [facility];  Service: Cardiovascular;  Laterality: N/A;   CORONARY STENT INTERVENTION N/A 03/29/2022   Procedure: CORONARY STENT INTERVENTION;  Surgeon: Alwyn Pea, MD;  Location: [facility];  Service: Cardiovascular;  Laterality: N/A;   ESI Left 08/2014, 11/2014, 08/2015   L S1, L L5/S1 transforaminal ESI; L4/5 L5/S1 zygapophysial injections, L S1 transforaminal ([facility])   FINGER SURGERY     right middle, partial traumatic amputation   IR CATHETER TUBE CHANGE  03/02/2021   KNEE ARTHROSCOPY  1990   right  KNEE SURGERY  04/2006   right partial knee replacement in [place] - rec ppx abx for any invasive procedure   LEFT HEART CATH AND CORONARY ANGIOGRAPHY N/A 01/18/2021   Procedure: LEFT HEART CATH AND CORONARY ANGIOGRAPHY;  Surgeon: Lamar Blinks, MD;  Location: [facility];  Service: Cardiovascular;  Laterality: N/A;   LEFT HEART CATH AND CORONARY ANGIOGRAPHY N/A 03/27/2022   Procedure: LEFT HEART CATH AND CORONARY ANGIOGRAPHY;  Surgeon: Alwyn Pea, MD;  Location: [facility];  Service: Cardiovascular;  Laterality: N/A;   LEFT HEART CATH AND CORONARY ANGIOGRAPHY N/A 12/11/2022   Procedure: LEFT HEART CATH AND CORONARY ANGIOGRAPHY;  Surgeon: Marcina Millard, MD;  Location: [facility];  Service: Cardiovascular;  Laterality: N/A;     Home Medications:  Prior to Admission medications   Medication Sig Start Date End Date Taking? Authorizing Provider  acetaminophen (TYLENOL 8 HOUR) 650 MG CR tablet Take 1 tablet (650 mg total) by mouth 2 (two) times a day. 06/24/18  Yes Eustaquio Boyden, MD  aspirin 81 MG chewable tablet Chew 1 tablet by mouth daily.   Yes [provider]  Cholecalciferol (VITAMIN D3) 25 MCG (1000 UT) CAPS Take 1 capsule (1,000 Units total) by mouth daily. 08/25/18  Yes Eustaquio Boyden, MD  clopidogrel (PLAVIX) 75 MG tablet Take 75 mg by mouth daily. 10/10/20  Yes [provider]  cyanocobalamin 1000 MCG tablet Take 1,000 mcg by mouth daily.   Yes [provider]  docusate sodium (COLACE) 100 MG capsule Take 1 capsule (100 mg total) by mouth daily. 12/13/20  Yes Eustaquio Boyden, MD  doxazosin (CARDURA) 4 MG tablet Take 4 mg by mouth daily. 02/13/23  Yes [provider]  famotidine (PEPCID) 20 MG tablet TAKE 1 TABLET BY MOUTH AT  BEDTIME 01/23/21  Yes Eustaquio Boyden, MD  finasteride (PROSCAR) 5 MG tablet TAKE 1 TABLET BY MOUTH  DAILY 11/17/19  Yes Eustaquio Boyden, MD  furosemide (LASIX) 20 MG tablet Take 1 tablet by mouth every other day. 10/09/20  Yes [provider]  hydrALAZINE (APRESOLINE) 50 MG tablet Take 1 tablet (50 mg total) by mouth every 8 (eight) hours as needed (systolic (top number) blood pressure higher than 160 OR  diastolic (bottom number) blood pressure higher than 100). 12/13/22  Yes Sunnie Nielsen, DO  isosorbide mononitrate (IMDUR) 60 MG 24 hr tablet Take 1 tablet (60 mg total) by mouth 2 (two) times daily. 03/31/22  Yes Enedina Finner, MD  loratadine (CLARITIN) 10 MG tablet Take 1 tablet (10 mg total) by mouth daily as needed for allergies. 12/22/12  Yes Eustaquio Boyden, MD  Melatonin 10 MG TABS Take 1 tablet by mouth daily as needed (for sleep).   Yes [provider]  metoprolol succinate (TOPROL-XL) 25 MG 24 hr tablet Take 1 tablet (25 mg total) by mouth 2 (two) times daily. 12/13/22  Yes Sunnie Nielsen, DO  nitroGLYCERIN (NITROSTAT) 0.4 MG SL tablet Place 1 tablet (0.4 mg total) under the tongue every 5 (five) minutes x 3 doses as needed for chest pain. 03/31/22  Yes Enedina Finner, MD  pantoprazole (PROTONIX) 40 MG tablet Take 40 mg by mouth 2 (two) times daily. 02/14/22 04/02/23 Yes [provider]  primidone (MYSOLINE) 50 MG tablet Take 2 tablets (100 mg total) by mouth 3 (three) times daily. 10/16/20  Yes Eustaquio Boyden, MD  rosuvastatin (CRESTOR) 20 MG tablet Take 20 mg by mouth at bedtime.   Yes [provider]  sucralfate (CARAFATE) 1 GM/10ML suspension Take 1 g by mouth 2 (two) times daily.   Yes [provider]  clindamycin (CLEOCIN) 150 MG capsule SMARTSIG:4 Capsule(s) By Mouth Patient not taking: Reported on 04/02/2023 02/27/23   [provider]    Inpatient Medications: Scheduled Meds:  bacitracin   Topical BID   clopidogrel  75 mg Oral Daily   cyanocobalamin  1,000 mcg Oral Daily   docusate sodium  100 mg Oral Daily   famotidine  20 mg Oral QHS   finasteride  5 mg Oral Daily   oxidized cellulose  1 each Topical Once   pantoprazole  40 mg Oral BID   primidone  100 mg Oral TID   rosuvastatin  20 mg Oral QHS   sucralfate  1 g Oral BID AC   Continuous Infusions:  PRN Meds: acetaminophen, HYDROmorphone (DILAUDID) injection,  nitroGLYCERIN, oxyCODONE  Allergies:    Allergies  Allergen Reactions   Penicillins Swelling    Of tongue Has patient had a PCN reaction causing immediate rash, facial/tongue/throat swelling, SOB or lightheadedness with hypotension: Yes Has patient had a PCN reaction causing severe rash involving mucus membranes or skin necrosis: No Has patient had a PCN reaction that required hospitalization: No Has patient had a PCN reaction occurring within the last 10 years: No If all of the above answers are "NO", then may proceed with Cephalosporin use.   Cialis [Tadalafil] Other (See Comments)    Indigestion   Lipitor [Atorvastatin] Other (See Comments)    Severe Myalgias   Lisinopril Cough    Social History:   Social History   Socioeconomic History   Marital status: Married    Spouse name: Not on file   Number of children: Not on file   Years of education: Not on file   Highest education level: Not on file  Occupational History   Occupation: retired Patent examiner, Network engineer, owns lawn care business  Tobacco Use   Smoking status: Former    Current packs/day: 0.00    Types: Pipe, Cigarettes    Quit date: 12/26/2000    Years since quitting: 22.2    Passive exposure: Past   Smokeless tobacco: Current    Types: Chew   Tobacco comments:    quit over 30 years ago  Vaping Use   Vaping status: Never Used  Substance and Sexual Activity   Alcohol use: Yes    Alcohol/week: 0.0 standard drinks of alcohol    Comment: Occasional beer   Drug use: No   Sexual activity: Not Currently  Other Topics Concern   Not on file  Social History Narrative   Widowed, remarried 18-Jul-2007.   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 [employer]    Activity: walks 1.5 miles 3d/wk   Diet: good water, fruits/vegetables daily   Social Drivers of Corporate investment banker Strain: Low Risk  (12/06/2022)    Received from [facility]   Overall Financial Resource Strain (CARDIA)    Difficulty of Paying Living Expenses: Not hard at all  Food Insecurity: No Food Insecurity (04/02/2023)   Hunger Vital Sign    Worried About Running Out of Food in the Last Year: Never true    Ran Out of Food in the Last Year: Never true  Transportation Needs: No Transportation Needs (04/02/2023)   PRAPARE - Transportation    Lack of Transportation (Medical): No    Lack  of Transportation (Non-Medical): No  Physical Activity: Not on file  Stress: Not on file  Social Connections: Unknown (04/02/2023)   Social Connection and Isolation Panel [NHANES]    Frequency of Communication with Friends and Family: More than three times a week    Frequency of Social Gatherings with Friends and Family: Not on file    Attends Religious Services: Not on file    Active Member of Clubs or Organizations: Not on file    Attends Banker Meetings: Not on file    Marital Status: Not on file  Intimate Partner Violence: Not At Risk (04/02/2023)   Humiliation, Afraid, Rape, and Kick questionnaire    Fear of Current or Ex-Partner: No    Emotionally Abused: No    Physically Abused: No    Sexually Abused: No    Family History:   Family History  Problem Relation Age of Onset   Hypertension Mother    Heart disease Brother 13       MI   Diabetes Brother    Cancer Daughter        melanoma   Stroke Neg Hx      ROS:  Please see the history of present illness.  All other ROS reviewed and negative.     Physical Exam/Data:   Vitals:   04/04/23 0000 04/04/23 0256 04/04/23 0257 04/04/23 0456  BP: 113/65 (!) 113/56  (!) 111/57  Pulse: (!) 109 (!) 111 (!) 109 (!) 103  Resp: 20 18    Temp: 98.8 F (37.1 C) 98.5 F (36.9 C)    TempSrc: Oral Oral    SpO2: 96% 98%  100%  Weight:      Height:        Intake/Output Summary (Last 24 hours) at 04/04/2023 0505 Last data filed at 04/04/2023 0300 Gross per 24  hour  Intake --  Output 1150 ml  Net -1150 ml      04/02/2023   11:23 AM 01/29/2023    8:57 AM 12/17/2022    8:44 AM  Last 3 Weights  Weight (lbs) 197 lb 182 lb 182 lb  Weight (kg) 89.359 kg 82.555 kg 82.555 kg     Body mass index is 28.27 kg/m.  General:  Well nourished, well developed, multiple facial lacerations, in no acute distress HEENT: normal Neck: no JVD Vascular: No carotid bruits; Distal pulses 2+ bilaterally Cardiac:  Tachycardia, normal S1, S2; RRR; 2/6 holosystolic murmur  Lungs:  clear to auscultation bilaterally, no wheezing, rhonchi or rales  Abd: soft, nontender, no hepatomegaly  Ext: no edema Musculoskeletal:  No deformities, BUE and BLE strength normal and equal Skin: warm and dry  Neuro:  CNs 2-12 intact, no focal abnormalities noted Psych:  Normal affect   EKG:  The EKG was personally reviewed and demonstrates:  Sinus tachycardia; IVCD; abnormal ECG Telemetry:  Telemetry was personally reviewed and demonstrates:  sinus tachycardia  Relevant CV Studies: # Coronary angiography 12/11/22:   Dist LAD lesion is 50% stenosed.   Dist LM to Ost LAD lesion is 60% stenosed.   Dist RCA lesion is 65% stenosed.   1st Mrg lesion is 80% stenosed.   Mid Cx lesion is 70% stenosed.   1st Diag lesion is 75% stenosed.   Mid LAD lesion is 20% stenosed.   Non-stenotic Prox RCA lesion was previously treated.   The left ventricular systolic function is normal.   The left ventricular ejection fraction is 35-45% by visual estimate.  # Echocardiogram 12/10/22:  IMPRESSIONS   1. Left ventricular ejection fraction, by estimation, is 45 to 50%. The  left ventricle has mildly decreased function. The left ventricle  demonstrates regional wall motion abnormalities (see scoring  diagram/findings for description). Left ventricular  diastolic parameters were normal.   2. Right ventricular systolic function is normal. The right ventricular  size is normal.   3. The mitral valve is  normal in structure. Mild mitral valve  regurgitation. No evidence of mitral stenosis.   4. The aortic valve is normal in structure. Aortic valve regurgitation is  trivial. No aortic stenosis is present.   5. The inferior vena cava is normal in size with greater than 50%  respiratory variability, suggesting right atrial pressure of 3 mmHg.   Laboratory Data:  High Sensitivity Troponin:   Recent Labs  Lab 04/03/23 1857 04/03/23 2013 04/03/23 2133 04/03/23 2306 04/04/23 0246  TROPONINIHS 99* 170* 253* 421* 2,714*     Chemistry Recent Labs  Lab 04/02/23 1120 04/02/23 1130 04/03/23 0442 04/04/23 0246  NA 139 137 140 141  K 4.5 4.5 5.3* 4.5  CL 108 108 107 111  CO2 22  --  21* 15*  GLUCOSE 130* 120* 174* 181*  BUN 28* 29* 59* 87*  CREATININE 1.85* 2.00* 2.09* 2.34*  CALCIUM 8.3*  --  7.7* 8.4*  GFRNONAA 34*  --  29* 25*  ANIONGAP 9  --  12 15    Recent Labs  Lab 04/02/23 1120  PROT 5.9*  ALBUMIN 3.6  AST 21  ALT 15  ALKPHOS 49  BILITOT 0.3   Lipids No results for input(s): "CHOL", "TRIG", "HDL", "LABVLDL", "LDLCALC", "CHOLHDL" in the last 168 hours.  Hematology Recent Labs  Lab 04/03/23 0933 04/03/23 1527 04/03/23 1913  WBC 18.6* 17.6* 19.2*  RBC 2.62* 2.62* 2.67*  HGB 8.0* 7.7* 8.0*  HCT 22.5* 22.7* 23.6*  MCV 85.9 86.6 88.4  MCH 30.5 29.4 30.0  MCHC 35.6 33.9 33.9  RDW 13.6 13.9 14.1  PLT 177 178 200   Thyroid No results for input(s): "TSH", "FREET4" in the last 168 hours.  BNPNo results for input(s): "BNP", "PROBNP" in the last 168 hours.  DDimer No results for input(s): "DDIMER" in the last 168 hours.   Radiology/Studies:  DG Swallowing Func-Speech Pathology Result Date: 04/03/2023 Table formatting from the original result was not included. Modified Barium Swallow Study Patient Details Name: JEMIAH ELLENBURG MRN: [id number] Date of Birth: Jan 20, 1929 Today's Date: 04/03/2023 HPI/PMH: HPI: COLAN LAYMON is a 89 yo male presenting to ED 3/12 after a  mechanical fall down a 10 ft embankment with multiple facial abrasions as well as a scalp laceration and hematoma. Imaging reveals 4 mm para falcine SDH without midline shift and displaced fxs of L C1 and C5 in addition to stenosis at C5-6 with multilevel bony narrowing. Significant epistaxis requiring cauterization by ENT. Esophagram 12/14/22 shows mild gastroesophageal reflux but no stricture, mass, or hiatal hernia. Pt's wife reports this is managed well with medication.   PMH includes CAD s/p stent placement 2024 on ASA and plavix, CHF, BPH requiring SP tube placement, HTN, CKD, HLD Clinical Impression: Clinical Impression: Pt presents with moderate oropharyngeal dysphagia chracterized by reduced bolus control. Pt reports oral pain, which affects oral transit and mastication. Pt tends to hold boluses orally until the head progresses posteriorly and pools in the pyriform sinuses, triggering a swallowing response. With thin liquids, pt is unable to protect his airway sufficienty before and after the swallow resulting in  silent aspiration (PAS 8). A cued cough is only somewhat effective at clearing aspirates. Pharyngeal clearance and PES opening is suspected to be affected by cervical osteophytes. Pt has a history of gastroesophageal reflux, which is now managed with medication, but its impact on swallowing may be further affected by these factors. When taking large successive sips, nectar thick liquids are transiently penetrated (PAS 2), but this is considered a functional variant. Given oral discomfort and reduced safety, recommend initiating diet of Dys 3 solids with nectar thick liquids. SLP will f/u to provide education to pt and his family and assess ability to upgrade diet as clinically indicated.  DIGEST Swallow Severity Rating*             Safety: 2             Efficiency: 1             Overall Pharyngeal Swallow Severity: 2 (moderate) 1: mild; 2: moderate; 3: severe; 4: profound *The Dynamic Imaging Grade  of Swallowing Toxicity is standardized for the head and neck cancer population, however, demonstrates promising clinical applications across populations to standardize the clinical rating of pharyngeal swallow safety and severity. Factors that may increase risk of adverse event in presence of aspiration Rubye Oaks & Clearance Coots 2021): Factors that may increase risk of adverse event in presence of aspiration Rubye Oaks & Clearance Coots 2021): Limited mobility; Frail or deconditioned; Weak cough Recommendations/Plan: Swallowing Evaluation Recommendations Swallowing Evaluation Recommendations Recommendations: PO diet PO Diet Recommendation: Dysphagia 2 (Finely chopped); Mildly thick liquids (Level 2, nectar thick) Liquid Administration via: Cup; Straw Medication Administration: Crushed with puree Supervision: Staff to assist with self-feeding; Full supervision/cueing for swallowing strategies Swallowing strategies  : Minimize environmental distractions; Slow rate; Small bites/sips Postural changes: Position pt fully upright for meals; Stay upright 30-60 min after meals Oral care recommendations: Oral care BID (2x/day) Treatment Plan Treatment Plan Treatment recommendations: Therapy as outlined in treatment plan below Follow-up recommendations: Acute inpatient rehab (3 hours/day) Functional status assessment: Patient has had a recent decline in their functional status and demonstrates the ability to make significant improvements in function in a reasonable and predictable amount of time. Treatment frequency: Min 2x/week Treatment duration: 2 weeks Interventions: Aspiration precaution training; Compensatory techniques; Patient/family education; Trials of upgraded texture/liquids; Diet toleration management by SLP Recommendations Recommendations for follow up therapy are one component of a multi-disciplinary discharge planning process, led by the attending physician.  Recommendations may be updated based on patient status, additional  functional criteria and insurance authorization. Assessment: Orofacial Exam: Orofacial Exam Oral Cavity: Oral Hygiene: Dried secretions Oral Cavity - Dentition: Dentures, top; Missing dentition Orofacial Anatomy: WFL Oral Motor/Sensory Function: WFL Anatomy: Anatomy: Suspected cervical osteophytes; Other (Comment) (spondylosis C1-5 per CT cervical spine) Boluses Administered: Boluses Administered Boluses Administered: Thin liquids (Level 0); Mildly thick liquids (Level 2, nectar thick); Moderately thick liquids (Level 3, honey thick); Puree; Solid  Oral Impairment Domain: Oral Impairment Domain Lip Closure: Escape progressing to mid-chin Tongue control during bolus hold: Cohesive bolus between tongue to palatal seal Bolus preparation/mastication: Timely and efficient chewing and mashing Bolus transport/lingual motion: Delayed initiation of tongue motion (oral holding) Oral residue: Residue collection on oral structures Location of oral residue : Floor of mouth; Tongue; Palate Initiation of pharyngeal swallow : Pyriform sinuses  Pharyngeal Impairment Domain: Pharyngeal Impairment Domain Soft palate elevation: No bolus between soft palate (SP)/pharyngeal wall (PW) Laryngeal elevation: Complete superior movement of thyroid cartilage with complete approximation of arytenoids to epiglottic petiole Anterior hyoid excursion:  Partial anterior movement Epiglottic movement: Complete inversion Laryngeal vestibule closure: Complete, no air/contrast in laryngeal vestibule Pharyngeal stripping wave : Present - diminished Pharyngeal contraction (A/P view only): N/A Pharyngoesophageal segment opening: Partial distention/partial duration, partial obstruction of flow Tongue base retraction: Narrow column of contrast or air between tongue base and PPW Pharyngeal residue: Trace residue within or on pharyngeal structures Location of pharyngeal residue: Tongue base; Valleculae; Pyriform sinuses  Esophageal Impairment Domain: Esophageal  Impairment Domain Esophageal clearance upright position: Complete clearance, esophageal coating Pill: No data recorded Penetration/Aspiration Scale Score: Penetration/Aspiration Scale Score 1.  Material does not enter airway: Moderately thick liquids (Level 3, honey thick); Puree; Solid 8.  Material enters airway, passes BELOW cords without attempt by patient to eject out (silent aspiration) : Thin liquids (Level 0) Compensatory Strategies: Compensatory Strategies Compensatory strategies: Yes Straw: Ineffective; Effective Effective Straw: Mildly thick liquid (Level 2, nectar thick) Ineffective Straw: Thin liquid (Level 0)   General Information: Caregiver present: No  Diet Prior to this Study: NPO   Temperature : Normal   Respiratory Status: WFL   Supplemental O2: Nasal cannula   History of Recent Intubation: No  Behavior/Cognition: Alert; Cooperative; Requires cueing Self-Feeding Abilities: Needs assist with self-feeding Baseline vocal quality/speech: Normal Volitional Cough: Able to elicit Volitional Swallow: Able to elicit Exam Limitations: No limitations Goal Planning: Prognosis for improved oropharyngeal function: Good Barriers to Reach Goals: Severity of deficits No data recorded Patient/Family Stated Goal: none stated Consulted and agree with results and recommendations: Patient Pain: Pain Assessment Pain Assessment: Faces Faces Pain Scale: 4 Pain Location: generalized with repositioning Pain Descriptors / Indicators: Discomfort; Grimacing Pain Intervention(s): Monitored during session End of Session: Start Time:SLP Start Time (ACUTE ONLY): 1405 Stop Time: SLP Stop Time (ACUTE ONLY): 1423 Time Calculation:SLP Time Calculation (min) (ACUTE ONLY): 18 min Charges: SLP Evaluations $ SLP Speech Visit: 1 Visit SLP Evaluations $BSS Swallow: 1 Procedure $MBS Swallow: 1 Procedure SLP visit diagnosis: SLP Visit Diagnosis: Dysphagia, oropharyngeal phase (R13.12) Past Medical History: Past Medical History: Diagnosis Date   Abnormal drug screen 06/22/2014  inapprop neg xanax rpt 3 mo (06/2014)  BPH (benign prostatic hypertrophy) 01/21/1998  has had 3 biopsies in past ([facility]) decided to stop PSA/DRE  CHF (congestive heart failure) (HCC)   Coronary artery disease   Hyperlipidemia 01/21/2002  Hypertension 05/22/2003  Left lumbar radiculopathy   Osteoarthritis 01/22/1988  knees, lumbar spondylosis and listhesis Past Surgical History: Past Surgical History: Procedure Laterality Date  CATARACT EXTRACTION  2013  bilateral  CORONARY ANGIOGRAPHY N/A 03/29/2022  Procedure: CORONARY ANGIOGRAPHY;  Surgeon: Alwyn Pea, MD;  Location: [facility];  Service: Cardiovascular;  Laterality: N/A;  CORONARY STENT INTERVENTION N/A 03/27/2022  Procedure: CORONARY STENT INTERVENTION;  Surgeon: Alwyn Pea, MD;  Location: [facility];  Service: Cardiovascular;  Laterality: N/A;  CORONARY STENT INTERVENTION N/A 03/29/2022  Procedure: CORONARY STENT INTERVENTION;  Surgeon: Alwyn Pea, MD;  Location: [facility];  Service: Cardiovascular;  Laterality: N/A;  ESI Left 08/2014, 11/2014, 08/2015  L S1, L L5/S1 transforaminal ESI; L4/5 L5/S1 zygapophysial injections, L S1 transforaminal ([place])  FINGER SURGERY    right middle, partial traumatic amputation  IR CATHETER TUBE CHANGE  03/02/2021  KNEE ARTHROSCOPY  1990  right  KNEE SURGERY  04/2006  right partial knee replacement in [place] - rec ppx abx for any invasive procedure  LEFT HEART CATH AND CORONARY ANGIOGRAPHY N/A 01/18/2021  Procedure: LEFT HEART CATH AND CORONARY ANGIOGRAPHY;  Surgeon: Arnoldo Hooker  J, MD;  Location: [facility];  Service: Cardiovascular;  Laterality: N/A;  LEFT HEART CATH AND CORONARY ANGIOGRAPHY N/A 03/27/2022  Procedure: LEFT HEART CATH AND CORONARY ANGIOGRAPHY;  Surgeon: Alwyn Pea, MD;  Location: [facility];  Service: Cardiovascular;  Laterality: N/A;  LEFT HEART CATH AND CORONARY ANGIOGRAPHY N/A 12/11/2022   Procedure: LEFT HEART CATH AND CORONARY ANGIOGRAPHY;  Surgeon: Marcina Millard, MD;  Location: [facility];  Service: Cardiovascular;  Laterality: N/A; Gwynneth Aliment, M.A., CF-SLP Speech Language Pathology, Acute Rehabilitation Services Secure Chat preferred [phone] 04/03/2023, 3:27 PM  CT HEAD WO CONTRAST ( ) Addendum Date: 04/02/2023 ADDENDUM REPORT: 04/02/2023 23:28 ADDENDUM: The above described hematoma is unchanged since the scan of 04/02/2023 11:49 a.m. Electronically Signed   By: Deatra Robinson M.D.   On: 04/02/2023 23:28   Result Date: 04/02/2023 CLINICAL DATA:  Head trauma EXAM: CT HEAD WITHOUT CONTRAST TECHNIQUE: Contiguous axial images were obtained from the base of the skull through the vertex without intravenous contrast. RADIATION DOSE REDUCTION: This exam was performed according to the departmental dose-optimization program which includes automated exposure control, adjustment of the mA and/or kV according to patient size and/or use of iterative reconstruction technique. COMPARISON:  None Available. FINDINGS: Brain: There is a 4 mm para falcine subdural hematoma. No midline shift or other mass effect. Chronic ischemic white matter changes. Vascular: There is atherosclerotic calcification of both internal carotid arteries at the skull base. Skull: Large frontal scalp hematoma.  No skull fracture. Sinuses/Orbits: No acute finding. Other: None. IMPRESSION: 1. A 4 mm para falcine subdural hematoma. No midline shift or other mass effect. 2. Large frontal scalp hematoma without skull fracture. Electronically Signed: By: Deatra Robinson M.D. On: 04/02/2023 23:14   CT Angio Neck W and/or Wo Contrast Result Date: 04/02/2023 CLINICAL DATA:  Neck trauma, arterial injury suspected. EXAM: CT ANGIOGRAPHY NECK TECHNIQUE: Multidetector CT imaging of the neck was performed using the standard protocol during bolus administration of intravenous contrast. Multiplanar CT image reconstructions and  MIPs were obtained to evaluate the vascular anatomy. Carotid stenosis measurements (when applicable) are obtained utilizing NASCET criteria, using the distal internal carotid diameter as the denominator. RADIATION DOSE REDUCTION: This exam was performed according to the departmental dose-optimization program which includes automated exposure control, adjustment of the mA and/or kV according to patient size and/or use of iterative reconstruction technique. CONTRAST:  75mL OMNIPAQUE IOHEXOL 350 MG/ML SOLN COMPARISON:  Cervical spine CT 04/02/2023 FINDINGS: Aortic arch: Standard branching with mild atherosclerosis. No significant stenosis of the arch vessel origins. Right carotid system: Patent with extensive calcified plaque in the carotid bulb resulting in 65% proximal ICA stenosis. Tortuous mid cervical ICA. No evidence of dissection. Left carotid system: Patent with a moderate amount of calcified plaque about the carotid bifurcation. No evidence of a significant stenosis or dissection. Tortuous distal cervical ICA. Vertebral arteries: Patent without evidence of a significant stenosis or dissection of the dominant left vertebral artery. In the right V3 segment at the C2 level, there is an apparent 4 mm long segment of diminished vessel opacification and severe stenosis, possibly with an intimal flap or thrombus (series 8, images 91 and 92), although streak artifact in this region could also be contributing to this appearance with mild artifact noted in the left vertebral artery at this same level. The right V3 segment is widely patent more distally. No pseudoaneurysm. Skeleton: Left C1 and C5 transverse process fractures and C5 spinous process fracture as described on today's  earlier spine CT. Other neck: No evidence of cervical lymphadenopathy or mass. Upper chest: More fully evaluated on today's earlier chest CT. IMPRESSION: 1. Focally abnormal appearance of the right vertebral artery at the C2 level with apparent  severe stenosis. In the setting of trauma, this is suspicious for grade 2 blunt cerebrovascular injury although streak artifact may also be contributing. 2. No evidence of acute arterial injury elsewhere in the neck. 3. 65% proximal right ICA stenosis. 4.  Aortic Atherosclerosis (ICD10-I70.0). Electronically Signed   By: Sebastian Ache M.D.   On: 04/02/2023 17:23   CT CHEST ABDOMEN PELVIS WO CONTRAST Result Date: 04/02/2023 CLINICAL DATA:  Fall. EXAM: CT CHEST, ABDOMEN AND PELVIS WITHOUT CONTRAST TECHNIQUE: Multidetector CT imaging of the chest, abdomen and pelvis was performed following the standard protocol without IV contrast. RADIATION DOSE REDUCTION: This exam was performed according to the departmental dose-optimization program which includes automated exposure control, adjustment of the mA and/or kV according to patient size and/or use of iterative reconstruction technique. COMPARISON:  August 08, 2022. FINDINGS: CT CHEST FINDINGS Cardiovascular: Atherosclerosis of thoracic aorta without aneurysm formation. Normal cardiac size. No pericardial effusion. Coronary artery calcifications are noted consistent with coronary artery disease. Mediastinum/Nodes: No enlarged mediastinal, hilar, or axillary lymph nodes. Thyroid gland, trachea, and esophagus demonstrate no significant findings. Lungs/Pleura: Minimal left pleural effusion is noted. No pneumothorax is noted. No acute consolidative process is noted. Musculoskeletal: No chest wall mass or suspicious bone lesions identified. CT ABDOMEN PELVIS FINDINGS Hepatobiliary: No focal liver abnormality is seen. No gallstones, gallbladder wall thickening, or biliary dilatation. Pancreas: Unremarkable. No pancreatic ductal dilatation or surrounding inflammatory changes. Spleen: Normal in size without focal abnormality. Adrenals/Urinary Tract: Adrenal glands appear normal. Bilateral renal cysts are noted for which no further follow-up is required. No hydronephrosis or renal  obstruction is noted. Urinary bladder is decompressed secondary to suprapubic catheter. Stomach/Bowel: Stomach is unremarkable. There is no evidence of bowel obstruction or inflammation. The appendix is unremarkable. Diverticulosis of descending and sigmoid colon is noted without inflammation. Vascular/Lymphatic: Aortic atherosclerosis. No enlarged abdominal or pelvic lymph nodes. Reproductive: Moderate prostatic enlargement is noted. Other: No abdominal wall hernia or abnormality. No abdominopelvic ascites. Musculoskeletal: Mild grade 1 anterolisthesis of L5-S1 is noted secondary to bilateral L5 spondylolysis. No acute osseous abnormality is noted. IMPRESSION: Minimal left pleural effusion is noted. No other traumatic injury seen in the chest, abdomen or pelvis. Diverticulosis of descending and sigmoid colon is noted without inflammation. Moderate prostatic enlargement. Extensive coronary artery calcifications are noted suggesting coronary artery disease. Aortic Atherosclerosis (ICD10-I70.0). Electronically Signed   By: Lupita Raider M.D.   On: 04/02/2023 15:01   CT HEAD WO CONTRAST Result Date: 04/02/2023 CLINICAL DATA:  Provided history: Facial trauma, blunt. Additional history provided: Fall (hitting ground and branches). On blood thinner. Right forehead swelling. Right eye swelling. Upper lip laceration. EXAM: CT HEAD WITHOUT CONTRAST CT MAXILLOFACIAL WITHOUT CONTRAST CT CERVICAL SPINE WITHOUT CONTRAST TECHNIQUE: Multidetector CT imaging of the head, cervical spine, and maxillofacial structures were performed using the standard protocol without intravenous contrast. Multiplanar CT image reconstructions of the cervical spine and maxillofacial structures were also generated. RADIATION DOSE REDUCTION: This exam was performed according to the departmental dose-optimization program which includes automated exposure control, adjustment of the mA and/or kV according to patient size and/or use of iterative  reconstruction technique. COMPARISON:  Head CT 08/09/2022. FINDINGS: CT HEAD FINDINGS Brain: Generalized cerebral atrophy. Acute subdural hematoma along the left aspect of the falx measuring up  to 3 mm in thickness (for instance as seen on series 3, image 25). No demarcated cortical infarct. No evidence of an intracranial mass. No midline shift. Vascular: No hyperdense vessel.  Atherosclerotic calcifications. Skull: No calvarial fracture or aggressive osseous lesion. Other: Large right anterior scalp, forehead and periorbital hematoma. Forehead laceration. CT MAXILLOFACIAL FINDINGS Osseous: No acute maxillofacial fracture is identified. Orbits: Large right periorbital hematoma. No acute finding within the orbits. Sinuses: Moderate polypoid mucosal thickening within the right maxillary sinus. Moderate mucosal thickening within the left maxillary sinus. Minimal mucosal thickening within bilateral ethmoid air cells. Soft tissues: Large right anterior scalp, forehead and periorbital hematoma. Forehead laceration. CT CERVICAL SPINE FINDINGS Alignment: Slight C4-C5 grade 1 anterolisthesis. Slight C5-C6 grade 1 retrolisthesis. Slight T2-T3 grade 1 anterolisthesis. Skull base and vertebrae: The basion-dental and atlanto-dental intervals are maintained. Acute displaced fracture of the left C1 transverse process (lateral to the foramen transversarium) (for instance as seen on series 9, image 66) (series 4, image 30). Mildly displaced acute fracture through the left C5 foramen transversarium (series 10, images 50-51). Acute, displaced fracture of the C5 spinous process (series 10, image 57). No other acute cervical spine fracture is identified. Soft tissues and spinal canal: No prevertebral fluid or swelling. No visible canal hematoma. Disc levels: Cervical spondylosis with multilevel disc space narrowing, disc bulges/central disc protrusions, uncovertebral hypertrophy and facet arthropathy. Disc space narrowing is greatest at  C4-C5 and C5-C6 (moderate to advanced at these levels). Multilevel spinal canal narrowing. Most notably at C5-C6, a central disc protrusion contributes to at least moderate spinal canal stenosis. Multilevel bony neural foraminal narrowing. Upper chest: No consolidation within the imaged lung apices. No visible pneumothorax. Acute findings called by telephone at the time of interpretation on 04/02/2023 at 1:49 pm to provider JULIE HAVILAND , who verbally acknowledged these results. IMPRESSION: CT head: 1. Acute subdural hematoma along the left aspect of the falx, measuring up to 3 mm in thickness. 2. Large right anterior scalp, forehead and periorbital hematoma. 3. Forehead laceration. 4. Cerebral atrophy. CT maxillofacial: 1. No evidence of an acute maxillofacial fracture. 2. Large right anterior scalp, forehead and periorbital hematoma. 3. Forehead laceration. 4. Paranasal sinus disease as described. CT cervical spine: 1. Acute displaced fracture of the left C1 transverse process (lateral to the foramen transversarium). 2. Acute mildly displaced fracture through the left C5 foramen transversarium. A CTA of the neck is recommended to exclude traumatic injury to the left vertebral artery. 3. Acute, displaced fracture of the C5 spinous process. 4. Mild grade 1 spondylolisthesis at C4-C5, C5-C6 and T2-T3. 5. Cervical spondylosis as described within the body of the report. Spinal canal stenosis is greatest at C5-C6 (at least moderate at this level). Multilevel bony neural foraminal narrowing. Electronically Signed   By: Jackey Loge D.O.   On: 04/02/2023 13:51   CT MAXILLOFACIAL WO CONTRAST Result Date: 04/02/2023 CLINICAL DATA:  Provided history: Facial trauma, blunt. Additional history provided: Fall (hitting ground and branches). On blood thinner. Right forehead swelling. Right eye swelling. Upper lip laceration. EXAM: CT HEAD WITHOUT CONTRAST CT MAXILLOFACIAL WITHOUT CONTRAST CT CERVICAL SPINE WITHOUT CONTRAST  TECHNIQUE: Multidetector CT imaging of the head, cervical spine, and maxillofacial structures were performed using the standard protocol without intravenous contrast. Multiplanar CT image reconstructions of the cervical spine and maxillofacial structures were also generated. RADIATION DOSE REDUCTION: This exam was performed according to the departmental dose-optimization program which includes automated exposure control, adjustment of the mA and/or kV according to patient size  and/or use of iterative reconstruction technique. COMPARISON:  Head CT 08/09/2022. FINDINGS: CT HEAD FINDINGS Brain: Generalized cerebral atrophy. Acute subdural hematoma along the left aspect of the falx measuring up to 3 mm in thickness (for instance as seen on series 3, image 25). No demarcated cortical infarct. No evidence of an intracranial mass. No midline shift. Vascular: No hyperdense vessel.  Atherosclerotic calcifications. Skull: No calvarial fracture or aggressive osseous lesion. Other: Large right anterior scalp, forehead and periorbital hematoma. Forehead laceration. CT MAXILLOFACIAL FINDINGS Osseous: No acute maxillofacial fracture is identified. Orbits: Large right periorbital hematoma. No acute finding within the orbits. Sinuses: Moderate polypoid mucosal thickening within the right maxillary sinus. Moderate mucosal thickening within the left maxillary sinus. Minimal mucosal thickening within bilateral ethmoid air cells. Soft tissues: Large right anterior scalp, forehead and periorbital hematoma. Forehead laceration. CT CERVICAL SPINE FINDINGS Alignment: Slight C4-C5 grade 1 anterolisthesis. Slight C5-C6 grade 1 retrolisthesis. Slight T2-T3 grade 1 anterolisthesis. Skull base and vertebrae: The basion-dental and atlanto-dental intervals are maintained. Acute displaced fracture of the left C1 transverse process (lateral to the foramen transversarium) (for instance as seen on series 9, image 66) (series 4, image 30). Mildly  displaced acute fracture through the left C5 foramen transversarium (series 10, images 50-51). Acute, displaced fracture of the C5 spinous process (series 10, image 57). No other acute cervical spine fracture is identified. Soft tissues and spinal canal: No prevertebral fluid or swelling. No visible canal hematoma. Disc levels: Cervical spondylosis with multilevel disc space narrowing, disc bulges/central disc protrusions, uncovertebral hypertrophy and facet arthropathy. Disc space narrowing is greatest at C4-C5 and C5-C6 (moderate to advanced at these levels). Multilevel spinal canal narrowing. Most notably at C5-C6, a central disc protrusion contributes to at least moderate spinal canal stenosis. Multilevel bony neural foraminal narrowing. Upper chest: No consolidation within the imaged lung apices. No visible pneumothorax. Acute findings called by telephone at the time of interpretation on 04/02/2023 at 1:49 pm to provider JULIE HAVILAND , who verbally acknowledged these results. IMPRESSION: CT head: 1. Acute subdural hematoma along the left aspect of the falx, measuring up to 3 mm in thickness. 2. Large right anterior scalp, forehead and periorbital hematoma. 3. Forehead laceration. 4. Cerebral atrophy. CT maxillofacial: 1. No evidence of an acute maxillofacial fracture. 2. Large right anterior scalp, forehead and periorbital hematoma. 3. Forehead laceration. 4. Paranasal sinus disease as described. CT cervical spine: 1. Acute displaced fracture of the left C1 transverse process (lateral to the foramen transversarium). 2. Acute mildly displaced fracture through the left C5 foramen transversarium. A CTA of the neck is recommended to exclude traumatic injury to the left vertebral artery. 3. Acute, displaced fracture of the C5 spinous process. 4. Mild grade 1 spondylolisthesis at C4-C5, C5-C6 and T2-T3. 5. Cervical spondylosis as described within the body of the report. Spinal canal stenosis is greatest at C5-C6 (at  least moderate at this level). Multilevel bony neural foraminal narrowing. Electronically Signed   By: Jackey Loge D.O.   On: 04/02/2023 13:51   CT CERVICAL SPINE WO CONTRAST Result Date: 04/02/2023 CLINICAL DATA:  Provided history: Facial trauma, blunt. Additional history provided: Fall (hitting ground and branches). On blood thinner. Right forehead swelling. Right eye swelling. Upper lip laceration. EXAM: CT HEAD WITHOUT CONTRAST CT MAXILLOFACIAL WITHOUT CONTRAST CT CERVICAL SPINE WITHOUT CONTRAST TECHNIQUE: Multidetector CT imaging of the head, cervical spine, and maxillofacial structures were performed using the standard protocol without intravenous contrast. Multiplanar CT image reconstructions of the cervical spine and maxillofacial  structures were also generated. RADIATION DOSE REDUCTION: This exam was performed according to the departmental dose-optimization program which includes automated exposure control, adjustment of the mA and/or kV according to patient size and/or use of iterative reconstruction technique. COMPARISON:  Head CT 08/09/2022. FINDINGS: CT HEAD FINDINGS Brain: Generalized cerebral atrophy. Acute subdural hematoma along the left aspect of the falx measuring up to 3 mm in thickness (for instance as seen on series 3, image 25). No demarcated cortical infarct. No evidence of an intracranial mass. No midline shift. Vascular: No hyperdense vessel.  Atherosclerotic calcifications. Skull: No calvarial fracture or aggressive osseous lesion. Other: Large right anterior scalp, forehead and periorbital hematoma. Forehead laceration. CT MAXILLOFACIAL FINDINGS Osseous: No acute maxillofacial fracture is identified. Orbits: Large right periorbital hematoma. No acute finding within the orbits. Sinuses: Moderate polypoid mucosal thickening within the right maxillary sinus. Moderate mucosal thickening within the left maxillary sinus. Minimal mucosal thickening within bilateral ethmoid air cells. Soft  tissues: Large right anterior scalp, forehead and periorbital hematoma. Forehead laceration. CT CERVICAL SPINE FINDINGS Alignment: Slight C4-C5 grade 1 anterolisthesis. Slight C5-C6 grade 1 retrolisthesis. Slight T2-T3 grade 1 anterolisthesis. Skull base and vertebrae: The basion-dental and atlanto-dental intervals are maintained. Acute displaced fracture of the left C1 transverse process (lateral to the foramen transversarium) (for instance as seen on series 9, image 66) (series 4, image 30). Mildly displaced acute fracture through the left C5 foramen transversarium (series 10, images 50-51). Acute, displaced fracture of the C5 spinous process (series 10, image 57). No other acute cervical spine fracture is identified. Soft tissues and spinal canal: No prevertebral fluid or swelling. No visible canal hematoma. Disc levels: Cervical spondylosis with multilevel disc space narrowing, disc bulges/central disc protrusions, uncovertebral hypertrophy and facet arthropathy. Disc space narrowing is greatest at C4-C5 and C5-C6 (moderate to advanced at these levels). Multilevel spinal canal narrowing. Most notably at C5-C6, a central disc protrusion contributes to at least moderate spinal canal stenosis. Multilevel bony neural foraminal narrowing. Upper chest: No consolidation within the imaged lung apices. No visible pneumothorax. Acute findings called by telephone at the time of interpretation on 04/02/2023 at 1:49 pm to provider JULIE HAVILAND , who verbally acknowledged these results. IMPRESSION: CT head: 1. Acute subdural hematoma along the left aspect of the falx, measuring up to 3 mm in thickness. 2. Large right anterior scalp, forehead and periorbital hematoma. 3. Forehead laceration. 4. Cerebral atrophy. CT maxillofacial: 1. No evidence of an acute maxillofacial fracture. 2. Large right anterior scalp, forehead and periorbital hematoma. 3. Forehead laceration. 4. Paranasal sinus disease as described. CT cervical  spine: 1. Acute displaced fracture of the left C1 transverse process (lateral to the foramen transversarium). 2. Acute mildly displaced fracture through the left C5 foramen transversarium. A CTA of the neck is recommended to exclude traumatic injury to the left vertebral artery. 3. Acute, displaced fracture of the C5 spinous process. 4. Mild grade 1 spondylolisthesis at C4-C5, C5-C6 and T2-T3. 5. Cervical spondylosis as described within the body of the report. Spinal canal stenosis is greatest at C5-C6 (at least moderate at this level). Multilevel bony neural foraminal narrowing. Electronically Signed   By: Jackey Loge D.O.   On: 04/02/2023 13:51   DG Pelvis Portable Result Date: 04/02/2023 CLINICAL DATA:  Trauma.  Fall. EXAM: PORTABLE PELVIS 1-2 VIEWS COMPARISON:  None Available. FINDINGS: Pelvis is intact with normal and symmetric sacroiliac joints. No acute fracture or dislocation. No aggressive osseous lesion. Visualized sacral arcuate lines are unremarkable. Unremarkable symphysis pubis.  There are mild degenerative changes of bilateral hip joints without significant joint space narrowing. Osteophytosis of the superior acetabulum. No radiopaque foreign bodies. IMPRESSION: No acute osseous abnormality of the pelvis. Electronically Signed   By: Jules Schick M.D.   On: 04/02/2023 13:23   DG Chest Port 1 View Result Date: 04/02/2023 CLINICAL DATA:  Trauma. EXAM: PORTABLE CHEST 1 VIEW COMPARISON:  12/09/2022 FINDINGS: Low lung volume. Bilateral lung fields are clear. Bilateral costophrenic angles are clear. Normal cardio-mediastinal silhouette. No acute osseous abnormalities. No acute displaced rib fracture seen. The soft tissues are within normal limits. IMPRESSION: No active disease. Electronically Signed   By: Jules Schick M.D.   On: 04/02/2023 13:22     Assessment and Plan:   Elevated HS-troponin: Patient with elevated HS-troponin in the setting of acute blood loss anemia requiring blood  transfusion with possible type II myocardial infarction (demand ischemia) and subdural hematoma (known etiology for elevated troponin especially in the context of traumatic brain injury).  Acute episodes of chest pain earlier based on patient's description and concern likely non-cardiac.  Low suspicion for type I myocardial infarction with NSTEMI-ACS at this time with coronary anatomy unlikely changed from his most recent coronary angiography.  Patient is able to tolerate clopidogrel and approved by neurosurgery to continue to take.  Start of heparin for low-concern for NSTEMI-ACS has a substantial high-risk for worsening subdural hematoma. --Unless active chest pain, would not continue monitoring HS-troponin. --Continue clopidogrel if neurosurgery says okay.  Coronary artery disease: Multivessel coronary artery disease status post multiple percutaneous interventions.  Episode of chest pain earlier of unclear etiology.  On guideline directed medical therapies.  --Continue current therapies.  Cardiomyopathy Heart failure with mildly reduced ejection fraction.  Currently asymptomatic.  Patient not taking therapies at home for mildly reduced function. --Management per primary cardiologist outpatient.    Risk Assessment/Risk Scores:                For questions or updates, please contact [facility] Please consult [email or web address] for contact info under    Signed, Judie Grieve, MD  04/04/2023 5:05 AM

## 2023-04-04 NOTE — Evaluation (Signed)
 Speech Language Pathology Evaluation Patient Details Name: Shawn Andrews MRN: [id number] DOB: Jan 24, 1928 Today's Date: 04/04/2023 Time: 1040-1109 SLP Time Calculation (min) (ACUTE ONLY): 29 min  Problem List:  Patient Active Problem List   Diagnosis Date Noted   Troponin level elevated 04/04/2023   Fall 04/02/2023   Subdural hematoma (HCC) 04/02/2023   Cervical transverse process fracture (HCC) 04/02/2023   Fracture of cervical spinous process (HCC) 04/02/2023   Dyslipidemia 12/09/2022   BPH (benign prostatic hyperplasia) 12/09/2022   Frequent PVCs 03/25/2022   Overweight (BMI 25.0-29.9) 10/04/2021   Generalized abdominal pain    Lower abdominal pain 02/26/2021   UTI (urinary tract infection) 02/25/2021   Suprapubic catheter (HCC) 02/25/2021   CKD (chronic kidney disease), stage IIIa 01/17/2021   Chronic systolic CHF (congestive heart failure) (HCC) 01/17/2021   Pleural effusion 01/17/2021   Acute on chronic systolic CHF (congestive heart failure) (HCC) 01/17/2021   Constipation 12/13/2020   Hyponatremia 12/13/2020   NSTEMI (non-ST elevated myocardial infarction) (HCC) 10/16/2020   HFrEF (heart failure with reduced ejection fraction) (HCC) 10/16/2020   CAD (coronary artery disease) 10/08/2020   Elevated vitamin B12 level 08/31/2020   Leg DVT (deep venous thromboembolism), acute, left (HCC) 08/09/2019   Skin lesion 07/18/2016   Health maintenance examination 07/19/2015   Advanced care planning/counseling discussion 07/05/2014   Memory deficit 07/05/2014   Chronic kidney disease, stage 3b (HCC) 06/23/2013   Post-nasal drainage 12/22/2012   Medicare annual wellness visit, subsequent 06/15/2012   Lumbosacral radiculopathy at L5 06/17/2011   High frequency hearing loss 10/18/2010   Vitamin D deficiency 11/15/2008   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   Essential hypertension 05/22/2003   HLD (hyperlipidemia) 01/21/2002   Essential tremor 01/22/2000   ELEVATED PROSTATE SPECIFIC  ANTIGEN 01/22/2000   Benign prostatic hyperplasia 01/21/1998   Osteoarthritis 01/22/1988   Past Medical History:  Past Medical History:  Diagnosis Date   Abnormal drug screen 06/22/2014   inapprop neg xanax rpt 3 mo (06/2014)   BPH (benign prostatic hypertrophy) 01/21/1998   has had 3 biopsies in past ([facility]) decided to stop PSA/DRE   CHF (congestive heart failure) (HCC)    Coronary artery disease    Hyperlipidemia 01/21/2002   Hypertension 05/22/2003   Left lumbar radiculopathy    Osteoarthritis 01/22/1988   knees, lumbar spondylosis and listhesis   Past Surgical History:  Past Surgical History:  Procedure Laterality Date   CATARACT EXTRACTION  2013   bilateral   CORONARY ANGIOGRAPHY N/A 03/29/2022   Procedure: CORONARY ANGIOGRAPHY;  Surgeon: Alwyn Pea, MD;  Location: [facility] INVASIVE CV LAB;  Service: Cardiovascular;  Laterality: N/A;   CORONARY STENT INTERVENTION N/A 03/27/2022   Procedure: CORONARY STENT INTERVENTION;  Surgeon: Alwyn Pea, MD;  Location: [facility] INVASIVE CV [facility];  Service: Cardiovascular;  Laterality: N/A;   CORONARY STENT INTERVENTION N/A 03/29/2022   Procedure: CORONARY STENT INTERVENTION;  Surgeon: Alwyn Pea, MD;  Location: [facility] INVASIVE CV LAB;  Service: Cardiovascular;  Laterality: N/A;   ESI Left 08/2014, 11/2014, 08/2015   L S1, L L5/S1 transforaminal ESI; L4/5 L5/S1 zygapophysial injections, L S1 transforaminal ([facility])   FINGER SURGERY     right middle, partial traumatic amputation   IR CATHETER TUBE CHANGE  03/02/2021   KNEE ARTHROSCOPY  1990   right   KNEE SURGERY  04/2006   right partial knee replacement in [place] - rec ppx abx for any invasive procedure   LEFT HEART CATH AND CORONARY ANGIOGRAPHY N/A 01/18/2021   Procedure: LEFT  HEART CATH AND CORONARY ANGIOGRAPHY;  Surgeon: Lamar Blinks, MD;  Location: [facility] INVASIVE CV LAB;  Service: Cardiovascular;  Laterality: N/A;   LEFT HEART CATH AND CORONARY ANGIOGRAPHY N/A 03/27/2022    Procedure: LEFT HEART CATH AND CORONARY ANGIOGRAPHY;  Surgeon: Alwyn Pea, MD;  Location: [facility] INVASIVE CV [facility];  Service: Cardiovascular;  Laterality: N/A;   LEFT HEART CATH AND CORONARY ANGIOGRAPHY N/A 12/11/2022   Procedure: LEFT HEART CATH AND CORONARY ANGIOGRAPHY;  Surgeon: Marcina Millard, MD;  Location: [facility] INVASIVE CV LAB;  Service: Cardiovascular;  Laterality: N/A;   HPI:  Shawn Andrews is a 89 yo male presenting to ED 3/12 after a mechanical fall down a 10 ft embankment with multiple facial abrasions as well as a scalp laceration and hematoma. Imaging reveals 4 mm para falcine SDH without midline shift and displaced fxs of L C1 and C5 in addition to stenosis at C5-6 with multilevel bony narrowing. Significant epistaxis requiring cauterization by ENT. Esophagram 12/14/22 shows mild gastroesophageal reflux but no stricture, mass, or hiatal hernia. Pt's wife reports this is managed well with medication.   PMH includes CAD s/p stent placement 2024 on ASA and plavix, CHF, BPH requiring SP tube placement, HTN, CKD, HLD   Assessment / Plan / Recommendation Clinical Impression  Pt and his family deny a signfiicant change in function compared to cognitive-linguistic baseline. He is lethargic this date and requires increased cueing to sustain attention, but is oriented x4 when roused. He demonstrates awareness of deficits, but is suspected to require cueing to increase safety insight. Articulation is reduced at the sentence level but affects intelligibiltiy minimally, although pt is generally unaware of listener misunderstanding and requires cueing to repeat himself and use speech intelligibility strategies. Given pt's prior independence, recommend intensive SLP f/u. Will continue following acutely.    SLP Assessment  SLP Recommendation/Assessment: Patient needs continued Speech Lanaguage Pathology Services SLP Visit Diagnosis: Cognitive communication deficit (R41.841)    Recommendations  for follow up therapy are one component of a multi-disciplinary discharge planning process, led by the attending physician.  Recommendations may be updated based on patient status, additional functional criteria and insurance authorization.    Follow Up Recommendations  Acute inpatient rehab (3hours/day)    Assistance Recommended at Discharge  Frequent or constant Supervision/Assistance  Functional Status Assessment Patient has had a recent decline in their functional status and demonstrates the ability to make significant improvements in function in a reasonable and predictable amount of time.  Frequency and Duration min 2x/week  2 weeks      SLP Evaluation Cognition  Overall Cognitive Status: Impaired/Different from baseline Arousal/Alertness: Awake/alert Orientation Level: Oriented X4 Attention: Sustained Sustained Attention: Impaired Sustained Attention Impairment: Verbal basic;Functional basic Memory: Appears intact Awareness: Impaired Awareness Impairment: Emergent impairment Problem Solving: Impaired Problem Solving Impairment: Functional basic [place]ant Scales of Cognitive Functioning: Automatic, Appropriate: Minimal Assistance for Daily Living Skills       Comprehension  Auditory Comprehension Overall Auditory Comprehension: Appears within functional limits for tasks assessed    Expression Expression Primary Mode of Expression: Verbal Verbal Expression Overall Verbal Expression: Appears within functional limits for tasks assessed   Oral / Motor  Oral Motor/Sensory Function Overall Oral Motor/Sensory Function: Within functional limits Motor Speech Overall Motor Speech: Impaired Respiration: Within functional limits Phonation: Normal Resonance: Within functional limits Articulation: Impaired Level of Impairment: Sentence Intelligibility: Intelligibility reduced Sentence: 75-100% accurate            Gwynneth Aliment, M.A., CF-SLP  Speech Language Pathology,  Acute Rehabilitation Services  Secure Chat preferred [phone]  04/04/2023, 11:58 AM

## 2023-04-04 NOTE — Progress Notes (Signed)
 Troponin levels continue to trend up. Latest one being 2,714. RN messaged on-call internal medicine [place] DO. No further orders given other than to continue to monitor for changes in vitals, chest pain and SOB.

## 2023-04-04 NOTE — Progress Notes (Signed)
 HD#2 SUBJECTIVE:  Patient Summary: Shawn Andrews is a 89 y.o. past medical history of CAD s/p PCI and stent LAD with DES on aspirin and plavix, HFrEF, tremor, hypertension, hyperlipidemia, CKD stage IIIb, BPH presents to ED today for mechanical fall, sustained multiple cervical fractures, subdural hematoma and facial abrasions.   Overnight Events: None  Interim History: Patient is evaluated, laying in chair. Wife and niece beside him. Patient denies any chest pain, shortness of breath, abdominal pain. Reports that he was unable to sleep last night. All questions were answered from the wife.   OBJECTIVE:  Vital Signs: Vitals:   04/04/23 0000 04/04/23 0256 04/04/23 0257 04/04/23 0456  BP: 113/65 (!) 113/56  (!) 111/57  Pulse: (!) 109 (!) 111 (!) 109 (!) 103  Resp: 20 18    Temp: 98.8 F (37.1 C) 98.5 F (36.9 C)    TempSrc: Oral Oral    SpO2: 96% 98%  100%  Weight:      Height:       Supplemental O2: Nasal Cannula SpO2: 100 % O2 Flow Rate (L/min): 1 L/min  Filed Weights   04/02/23 1123  Weight: 89.4 kg     Intake/Output Summary (Last 24 hours) at 04/04/2023 0723 Last data filed at 04/04/2023 0300 Gross per 24 hour  Intake --  Output 1150 ml  Net -1150 ml   Net IO Since Admission: -986 mL [04/04/23 0723]  Physical Exam: Physical Exam  General: Laying in chair, answering questions appropriately, no acute distress HEENT: Facial abrasions, right periorbital ecchymosis and edema, right eye showed, left nasal bridge ecchymosis, lips swollen, c-collar in place. Cardiovascular: Regular rate Pulmonary: Breathing comfortably, no wheezing or crackles Abdomen: Tenderness to palpation in the lower abdomen Neuro: Able to answer questions appropriately, participate in conversation, no focal deficits  Patient Lines/Drains/Airways Status     Active Line/Drains/Airways     Name Placement date Placement time Site Days   Peripheral IV 04/02/23 18 G Left Antecubital 04/02/23  1119   Antecubital  2   Suprapubic Catheter Latex 16 Fr. 08/10/22  1158  Latex  237   Wound / Incision (Open or Dehisced) 04/03/23 Skin tear Arm Lower;Posterior;Right Skin tear to right forearm 04/03/23  0820  Arm  1   Wound / Incision (Open or Dehisced) 04/03/23 Other (Comment);Skin tear Nose Anterior;Circumferential;Bilateral forehead lacerations and nose abrasions 04/03/23  0820  Nose  1             ASSESSMENT/PLAN:  Assessment: Principal Problem:   Fall Active Problems:   Essential hypertension   Benign prostatic hyperplasia   Chronic kidney disease, stage 3b (HCC)   CAD (coronary artery disease)   CKD (chronic kidney disease), stage IIIa   Suprapubic catheter (HCC)   Dyslipidemia   Subdural hematoma (HCC)   Cervical transverse process fracture (HCC)   Fracture of cervical spinous process (HCC)   Troponin level elevated   Plan: Mechanical fall Cervical fractures at C1 and C5 3 mm para falcine subdural hematoma, stable  Large Forehead Laceration (repaired) and hematoma  Facial Abrasions  Presented to ED after mechanical fall this AM without any prodromal symptoms sustaining subdural hematoma, lateral anterior scalp, forehead and periorbital hematomas, forehead laceration, acute displaced fractures of C1 and C5 and facial abrasions. EDP provider repaired forehead laceration, that appears to be still draining. Repeat CTH without any acute changes. NSGY and Trauma on board. - No surgical intervention at this time per NSGY - If any changes to mental status,  get STAT CTH and notify NSGY  - PT/OT when able  - MBS 3/13 --> DYS 3  - PRN Dilaudid q4h and PO oxy 2.5-5 q4h    Epistaxis s/p R nares cautery  CT Maxillofacial without any evidence of acute maxillofacial fracture. ENT consulted 3/12 for posterior oropharynx bleed. S/p R nare cautery. Stable.    Acute blood loss anemia  Hx Anemia of chronic kidney disease  Hgb dropped from 10.9 to 7.5 on 3/12; s/p 1 PRBC. - Hgb stable  this AM, Hgb 8.3 (8.0) - No signs of active bleed - Monitor CBC    Leukocytosis WBC trending high, WBC 21.9 (19.2), initially came in with WBC 7.2; had a fever 100.4 on 3/13; UA +leukocytes.  - Follow up on Blood cultures x 2    Concern for black stool Had two episodes of melena 3/13. Low suspicion for GI bleed at this time. Likely from digested oropharyn bleed from 3/12. - Repeat Hgbs stable.  - Continue to monitor    CAD s/p multiple stents  NSTEMI s/p two overlapping DES stent to proximal/mid LAD (09/2020), stent to proximal RCA and stent to in-stent restenosis of mid LAD (03/2022). Previously admitted 12/09/2022 for chest pain, NSTEMI w/ elevated troponin levels. 12/11/2022 LHC with patent stents and no definite culprit lesion. Managed medically. Concern about chest tightness, chest pain, diaphoresis 3/13, EKG with out acute changes compared to previous EKG findings. Trop 170>253>421>2714. Considering demand ischemia in the setting of acute bleed. Consulted Cards, appreciate their input, continue medical management.  - Continue Plavix 75 mg daily  - PRN Nitroglycerin SL tablet 0.4 mg    CKD stage 3B, Baseline Scr ~1.8 BPH Indwelling Suprapubic catheter 2/2 urinary retention  UTI  Follows Urology and had it replaced on 03/12/2023, is supposed to get it exchanged every 6 weeks. UA +large leukocytes with Triple Phosphate crystal suspicious for possible Klebsiella. His previous UA 11/225 grew Klebsiella resistant to ampicillin and nitrofurantoin treated with Cipro. During exam, abdominal tenderness noted.  - Follow up UA culture      - Start Rocephin 2 g today for UTI  - Continue Finasteride 5 mg daily  - Avoid nephrotoxic agents  Chronic HFrEF Hypertension Last ECHO 11/2022 showed EF 45 to 50% with regional wall motion abnormalities and normal RV function. Home medications consist of metoprolol 25 mg twice daily, Imdur 60 mg twice daily, hydralazine 50 mg 3 times daily, Lasix 20 mg  every other day. -Hold home GDMT in the setting of hypotension and active bleed   Essential tremors  -Continue home medication primidone 100 mg TID    GERD -Continue home medications: PPI BID, Pepcid 20 mg at bedtime, Sulcrafate 1g BID before meals.   Diverticulosis CTAP noted diverticulosis of descending and sigmoid colon is noted without inflammation. Bowel regimen.   Hyperlipidemia Continue home medication Crestor  Best Practice: Diet: DYS III  IVF: Fluids: None, Rate: None VTE: SCDs Start: 04/02/23 1630 Code: Full AB: None  Therapy Recs: Pending, Family Contact: Wife, at bedside. DISPO: Anticipated discharge  3-5 days  to Rehab pending  medical management .  Signature: Jeral Pinch, D.O.  Internal Medicine Resident, PGY-1 [facility]  Pager: [phone] 7:23 AM, 04/04/2023   Please contact the on call pager after 5 pm and on weekends at [phone].

## 2023-04-04 NOTE — Progress Notes (Signed)
   Brief cardiology follow-up note.  Patient was seen earlier this morning by Dr. Orson Aloe for elevated troponin.  Apparently did have some chest pain yesterday which sounded more consistent with GERD however troponin did go up to 2000.  This is in the setting of a fall with subdural hematoma anemia to the point of hemoglobin down to 7 and known existing CAD.  I would not call this is not send no recall this demand ischemia based on existing disease and anemia lead to "ischemia ".  Will continue to monitor while he is in the hospital, but no active cardiac issues.  Could consider checking 2D echocardiogram if there is some signs or symptoms of the dyspnea or heart failure, however he seems to be relatively stable.  In the setting of subdural hematoma and significant bruising on the in the face and head, anticoagulation is contraindicated.  He has been cleared to continue clopidogrel which is helpful, but with the lack of being able to use anticoagulation would preclude [place] from doing anything from a treatment standpoint percutaneously.  Would continue to treat supportively for his subdural hematoma.  As blood pressure tolerates, okay to restart his home meds.  Cardiology will follow from a distance.  Please contact the on-call weekend rounding team for questions or concerns...   Bryan Lemma, MD

## 2023-04-05 DIAGNOSIS — S12490D Other displaced fracture of fifth cervical vertebra, subsequent encounter for fracture with routine healing: Secondary | ICD-10-CM | POA: Diagnosis not present

## 2023-04-05 DIAGNOSIS — J690 Pneumonitis due to inhalation of food and vomit: Secondary | ICD-10-CM

## 2023-04-05 DIAGNOSIS — I130 Hypertensive heart and chronic kidney disease with heart failure and stage 1 through stage 4 chronic kidney disease, or unspecified chronic kidney disease: Secondary | ICD-10-CM

## 2023-04-05 DIAGNOSIS — S12091D Other nondisplaced fracture of first cervical vertebra, subsequent encounter for fracture with routine healing: Secondary | ICD-10-CM | POA: Diagnosis not present

## 2023-04-05 DIAGNOSIS — W19XXXD Unspecified fall, subsequent encounter: Secondary | ICD-10-CM | POA: Diagnosis not present

## 2023-04-05 DIAGNOSIS — I5020 Unspecified systolic (congestive) heart failure: Secondary | ICD-10-CM

## 2023-04-05 DIAGNOSIS — D72829 Elevated white blood cell count, unspecified: Secondary | ICD-10-CM

## 2023-04-05 DIAGNOSIS — D62 Acute posthemorrhagic anemia: Secondary | ICD-10-CM | POA: Diagnosis not present

## 2023-04-05 DIAGNOSIS — N390 Urinary tract infection, site not specified: Secondary | ICD-10-CM

## 2023-04-05 DIAGNOSIS — I214 Non-ST elevation (NSTEMI) myocardial infarction: Secondary | ICD-10-CM

## 2023-04-05 LAB — CBC
HCT: 22.9 % — ABNORMAL LOW (ref 39.0–52.0)
Hemoglobin: 7.7 g/dL — ABNORMAL LOW (ref 13.0–17.0)
MCH: 30.1 pg (ref 26.0–34.0)
MCHC: 33.6 g/dL (ref 30.0–36.0)
MCV: 89.5 fL (ref 80.0–100.0)
Platelets: 217 10*3/uL (ref 150–400)
RBC: 2.56 MIL/uL — ABNORMAL LOW (ref 4.22–5.81)
RDW: 14.6 % (ref 11.5–15.5)
WBC: 15.6 10*3/uL — ABNORMAL HIGH (ref 4.0–10.5)
nRBC: 0 % (ref 0.0–0.2)

## 2023-04-05 LAB — BASIC METABOLIC PANEL
Anion gap: 12 (ref 5–15)
BUN: 93 mg/dL — ABNORMAL HIGH (ref 8–23)
CO2: 17 mmol/L — ABNORMAL LOW (ref 22–32)
Calcium: 8.5 mg/dL — ABNORMAL LOW (ref 8.9–10.3)
Chloride: 117 mmol/L — ABNORMAL HIGH (ref 98–111)
Creatinine, Ser: 2.43 mg/dL — ABNORMAL HIGH (ref 0.61–1.24)
GFR, Estimated: 24 mL/min — ABNORMAL LOW (ref >60)
Glucose, Bld: 199 mg/dL — ABNORMAL HIGH (ref 70–99)
Potassium: 3.6 mmol/L (ref 3.5–5.1)
Sodium: 146 mmol/L — ABNORMAL HIGH (ref 135–145)

## 2023-04-05 MED ORDER — ENSURE ENLIVE PO LIQD
237.0000 mL | Freq: Two times a day (BID) | ORAL | Status: DC
  Administered 2023-04-06: 237 mL via ORAL

## 2023-04-05 NOTE — Evaluation (Signed)
 Occupational Therapy Evaluation Patient Details Name: Shawn Andrews MRN: [id number] DOB: 06-04-1928 Today's Date: 04/05/2023   History of Present Illness   Shawn Andrews is a 89 yo male presenting to ED 3/12 after a mechanical fall down a 10 ft embankment with multiple facial abrasions as well as a scalp laceration and hematoma. Imaging reveals 4 mm para falcine SDH without midline shift and displaced fxs of L C1 and C5 in addition to stenosis at C5-6 with multilevel bony narrowing. Significant epistaxis requiring cauterization by ENT. Esophagram 12/14/22 shows mild gastroesophageal reflux but no stricture, mass, or hiatal hernia. Pt's wife reports this is managed well with medication.   PMH includes CAD s/p stent placement 2024 on ASA and plavix, CHF, BPH requiring SP tube placement, HTN, CKD, HLD     Clinical Impressions PTA, pt lived with wife who reports he was very independent and active. Upon eval, pt with decreased LUE ROM (wife and daughter unable to report if baseline), generalized weakness, poor safety awareness, decreased vision, and balance. Needing up to mod A for LB Adl and max A for UB ADL.  Also hard of hearing so needing multimodal cues. Some confusion with presence of catheter and pt felt need to use restroom. Pt attempts to premature sit without visually locating chair when returning to room needing redirection. Will continue to follow. Due to significant change in functional status, recommending intensive multidisciplinary rehabilitation >3 hours/day to optimize safety and independence in ADL.       If plan is discharge home, recommend the following:   A lot of help with bathing/dressing/bathroom;A lot of help with walking and/or transfers;Assistance with cooking/housework;Help with stairs or ramp for entrance;Assist for transportation     Functional Status Assessment   Patient has had a recent decline in their functional status and demonstrates the ability to make  significant improvements in function in a reasonable and predictable amount of time.     Equipment Recommendations   Other (comment) (defer)     Recommendations for Other Services   Rehab consult     Precautions/Restrictions   Precautions Precautions: Fall;Cervical Precaution Booklet Issued: No Precaution/Restrictions Comments: Watch HR Required Braces or Orthoses: Cervical Brace Cervical Brace: Hard collar;At all times Restrictions Weight Bearing Restrictions Per Provider Order: No     Mobility Bed Mobility               General bed mobility comments: OOB in chair    Transfers Overall transfer level: Needs assistance Equipment used: Rolling walker (2 wheels) Transfers: Sit to/from Stand Sit to Stand: Min assist           General transfer comment: cues for hand placement      Balance Overall balance assessment: Needs assistance Sitting-balance support: Feet supported Sitting balance-Leahy Scale: Fair     Standing balance support: No upper extremity supported, During functional activity Standing balance-Leahy Scale: Poor Standing balance comment: External support from therapist                           ADL either performed or assessed with clinical judgement   ADL Overall ADL's : Needs assistance/impaired     Grooming: Minimal assistance;Sitting   Upper Body Bathing: Sitting;Moderate assistance   Lower Body Bathing: Minimal assistance;Sit to/from stand   Upper Body Dressing : Maximal assistance;Sitting   Lower Body Dressing: Moderate assistance;Sit to/from stand   Toilet Transfer: Minimal assistance;Rolling walker (2 wheels);Ambulation;Cueing for sequencing;Cueing for safety  Toileting- Clothing Manipulation and Hygiene: Total assistance;Sit to/from stand       Functional mobility during ADLs: Minimal assistance;Rolling walker (2 wheels) General ADL Comments: cues for safety with RW throughout     Vision Baseline  Vision/History: 1 Wears glasses Ability to See in Adequate Light: 1 Impaired Patient Visual Report: No change from baseline Vision Assessment?: Vision impaired- to be further tested in functional context Additional Comments: pt denies changes, able to read sign on restroom door with increased time. Denies double or blurrier vision than normal mild undershooting with reaching but unsure if due to weakness as pt did not undershoot when attempting to doff socks.     Perception         Praxis         Pertinent Vitals/Pain Pain Assessment Pain Assessment: Faces Faces Pain Scale: Hurts little more Pain Location: L shoulder Pain Descriptors / Indicators: Discomfort, Grimacing Pain Intervention(s): Limited activity within patient's tolerance, Monitored during session     Extremity/Trunk Assessment Upper Extremity Assessment Upper Extremity Assessment: Generalized weakness;Left hand dominant;RUE deficits/detail;LUE deficits/detail RUE Deficits / Details: generally weak but able to reach outside of base of support and shoulder flexion to at least 90 degrees. ABle to make fist, OK sign, hold up one finger, hold up two fingers. Pt unable to replifate finger to thumb opposition but suspect due to vision. Undershooting with reaching tasks LUE Deficits / Details: LUE painful ROM at 30 degrees shoulder flexion. pt with poor ability to lift arm against gravity. Pt able to make OK sign, hold two fingers up, hold one finger up. Unable to get pt to attempt opposition secondary to visual challenges. LUE: Shoulder pain with ROM   Lower Extremity Assessment Lower Extremity Assessment: Defer to PT evaluation   Cervical / Trunk Assessment Cervical / Trunk Assessment: Other exceptions (neck fx)   Communication Communication Communication: Impaired Factors Affecting Communication: Hearing impaired   Cognition Arousal: Alert Behavior During Therapy: Flat affect Cognition: Difficult to assess Difficult  to assess due to: Hard of hearing/deaf           OT - Cognition Comments: very hard of hearing. Pt is having some confusion per family. Follows one step commands with increased time and frequent multimodal cues secondayr to being hard of heaing. poor safety awareness during mobility.               [employer] Scales of Cognitive Functioning: Automatic, Appropriate: Minimal Assistance for Daily Living Skills [VII] Following commands: Impaired Following commands impaired: Follows one step commands with increased time (needs repeated commands secondary to being hard of hearing.)     Cueing  General Comments   Cueing Techniques: Verbal cues;Gestural cues;Tactile cues      Exercises     Shoulder Instructions      Home Living Family/patient expects to be discharged to:: Private residence Living Arrangements: Spouse/significant other Available Help at Discharge: Family;Available 24 hours/day Type of Home: House Home Access: Level entry     Home Layout: One level     Bathroom Shower/Tub: Producer, television/film/video: Standard     Home Equipment: Surveyor, minerals - single point;Rollator (4 wheels)      Lives With: Spouse    Prior Functioning/Environment Prior Level of Function : Independent/Modified Independent             Mobility Comments: Rides stationary bike 30 minutes/day, [facility], walking and upper body strengthening ADLs Comments: independent  OT Problem List: Decreased strength;Decreased activity tolerance;Impaired balance (sitting and/or standing);Decreased cognition;Decreased coordination;Decreased safety awareness;Decreased knowledge of use of DME or AE;Decreased knowledge of precautions;Impaired UE functional use   OT Treatment/Interventions: Self-care/ADL training;Therapeutic exercise;DME and/or AE instruction;Therapeutic activities;Patient/family education;Balance training      OT Goals(Current goals can be  found in the care plan section)   Acute Rehab OT Goals Patient Stated Goal: go home OT Goal Formulation: With patient Time For Goal Achievement: 04/19/23 Potential to Achieve Goals: Good   OT Frequency:  Min 2X/week    Co-evaluation              AM-PAC OT "6 Clicks" Daily Activity     Outcome Measure Help from another person eating meals?: A Little Help from another person taking care of personal grooming?: A Little Help from another person toileting, which includes using toliet, bedpan, or urinal?: A Lot Help from another person bathing (including washing, rinsing, drying)?: A Lot Help from another person to put on and taking off regular upper body clothing?: A Lot Help from another person to put on and taking off regular lower body clothing?: A Lot 6 Click Score: 14   End of Session Equipment Utilized During Treatment: Gait belt;Rolling walker (2 wheels);Cervical collar Nurse Communication: Mobility status  Activity Tolerance: Patient tolerated treatment well;Patient limited by fatigue Patient left: in chair;with call bell/phone within reach;with chair alarm set;with family/visitor present  OT Visit Diagnosis: Unsteadiness on feet (R26.81);History of falling (Z91.81);Muscle weakness (generalized) (M62.81);Other symptoms and signs involving cognitive function;Low vision, both eyes (H54.2)                Time: 1610-9604 OT Time Calculation (min): 30 min Charges:  OT General Charges $OT Visit: 1 Visit OT Evaluation $OT Eval Moderate Complexity: 1 Mod OT Treatments $Self Care/Home Management : 8-22 mins  Tyler Deis, OTR/L [facility] Acute Rehabilitation Office: [phone]   Myrla Halsted 04/05/2023, 5:19 PM

## 2023-04-05 NOTE — Progress Notes (Signed)
 Inpatient Rehab Admissions Coordinator:    I spoke with Pt.'s wife and she states that they have decided to pursue placement at [facility] rather than at [facility]. I will sign off.   Megan Salon, MS, CCC-SLP Rehab Admissions Coordinator  [phone] (celll) [phone] (office)

## 2023-04-05 NOTE — Progress Notes (Signed)
  NEUROSURGERY PROGRESS NOTE   No issues overnight.  EXAM:  BP (!) 101/91 (BP Location: Left Arm)   Pulse 85   Temp 98.7 F (37.1 C) (Axillary)   Resp 20   Ht 5\' 10"  (1.778 m)   Wt 89.4 kg   SpO2 97%   BMI 28.27 kg/m   Drowsy but easily arouses.  Speech fluent CN grossly intact  MAE well Collar in place  IMPRESSION:  89 y.o. male s/p fall, neurologically stable on plavix for CAD with minor falcine SDH and non-operative C-spine fractures  PLAN: - Cont supportive care per primary team - Cont [place] collar - Can f/u in outpatient NS clinic for C-spine fractures in 4 weeks   Lisbeth Renshaw, MD [facility]

## 2023-04-05 NOTE — Progress Notes (Addendum)
 HD#3 Subjective:   Summary: This is a 89 year old male with a past medical history of CAD status post PCI on aspirin and Plavix, HFrEF with last echo 45 to 50% on 12/11/2022, hypertension, hyperlipidemia who presents to the emergency department after falling down and found to have multiple cervical spine fractures as well as subdural hematoma.  Patient admitted for further evaluation and workup.  Overnight Events: No acute events overnight  Patient evaluated bedside this morning.  Patient is with neighbor at bedside.  Patient is sleeping.  Neighbor gives most history.  Patient has been doing well per neighbor.  Patient denies any more chest pain.  He is eating well.  He also had a bowel movement.  Objective:  Vital signs in last 24 hours: Vitals:   04/04/23 1948 04/04/23 2300 04/05/23 0326 04/05/23 0834  BP: (!) 114/59 99/69 113/62 (!) 101/91  Pulse: 95  85   Resp: 20  20 20   Temp: 97.9 F (36.6 C) 98.7 F (37.1 C) 98.7 F (37.1 C) 98.7 F (37.1 C)  TempSrc: Axillary Oral Oral Axillary  SpO2: 100% 100% 96% 97%  Weight:      Height:       Supplemental O2: Room Air SpO2: 96 %  Physical Exam:  Constitutional: Resting in bed, no acute distress, c-collar in place HENT: Bruising noted to right side of face, swollen right eye Eyes: Conjunctiva nonerythematous, EOMI, PERRLA Neck: In c-collar Cardiovascular: regular rate and rhythm, no m/r/g Pulmonary/Chest: normal work of breathing on room air, lungs clear to auscultation bilaterally Abdominal: soft, non-tender, non-distended, normoactive bowel sounds  Filed Weights   04/02/23 1123  Weight: 89.4 kg     Intake/Output Summary (Last 24 hours) at 04/05/2023 0954 Last data filed at 04/05/2023 0123 Gross per 24 hour  Intake 200 ml  Output 550 ml  Net -350 ml   Net IO Since Admission: -1,336 mL [04/05/23 0954]  Pertinent Labs:    Latest Ref Rng & Units 04/05/2023    8:15 AM 04/04/2023    5:58 AM 04/03/2023    7:13 PM  CBC   WBC 4.0 - 10.5 K/uL 15.6  21.9  19.2   Hemoglobin 13.0 - 17.0 g/dL 7.7  8.3  8.0   Hematocrit 39.0 - 52.0 % 22.9  23.9  23.6   Platelets 150 - 400 K/uL 217  244  200        Latest Ref Rng & Units 04/05/2023    8:15 AM 04/04/2023    2:46 AM 04/03/2023    4:42 AM  CMP  Glucose 70 - 99 mg/dL 981  191  478   BUN 8 - 23 mg/dL 93  87  59   Creatinine 0.61 - 1.24 mg/dL 2.95  6.21  3.08   Sodium 135 - 145 mmol/L 146  141  140   Potassium 3.5 - 5.1 mmol/L 3.6  4.5  5.3   Chloride 98 - 111 mmol/L 117  111  107   CO2 22 - 32 mmol/L 17  15  21    Calcium 8.9 - 10.3 mg/dL 8.5  8.4  7.7     Imaging: DG CHEST PORT 1 VIEW Result Date: 04/04/2023 CLINICAL DATA:  Cough EXAM: PORTABLE CHEST 1 VIEW COMPARISON:  04/02/2023 FINDINGS: Single frontal view of the chest demonstrates a stable cardiac silhouette. Patchy consolidation at the left lung base has developed in the interim, which may reflect infection or aspiration. Trace left effusion again noted. Right chest is clear. No  pneumothorax. No acute bony abnormalities. IMPRESSION: 1. New patchy left basilar consolidation, which may reflect aspiration or infection. 2. Trace left pleural effusion. Electronically Signed   By: Sharlet Salina M.D.   On: 04/04/2023 14:10    Assessment/Plan:   Principal Problem:   Fall Active Problems:   Essential hypertension   Benign prostatic hyperplasia   Chronic kidney disease, stage 3b (HCC)   CAD (coronary artery disease)   CKD (chronic kidney disease), stage IIIa   Suprapubic catheter (HCC)   Dyslipidemia   Subdural hematoma (HCC)   Cervical transverse process fracture (HCC)   Fracture of cervical spinous process (HCC)   Troponin level elevated   Patient Summary: Shawn Andrews is a 89 y.o. with a pertinent PMH HFrEF, CKD stage IIIb, BPH, hypertension who presents after having a fall.  Patient found to have multiple cervical spine fractures and subdural hematoma.  Patient admitted for further evaluation  management.  #Mechanical fall down embankment #Cervical fractures of C1 and C5 #4 mm para falcine subdural hematoma, stable Neurologically, patient is still stable.  Patient is improving day by day.  Neurosurgery and trauma surgery following.  Hemoglobin slightly decreased to 7.7 today.  -Neurosurgery following, appreciate recommendation -Trauma surgery following, appreciate recommendations -Get patient up and moving with PT/OT -Continue to advance diet as tolerated -Continue c-collar -Pain control with Tylenol 1000 mg as needed, Dilaudid 0.5 mg every 4 hours as needed, oxycodone 5 mg every 4 hours as needed  #Acute blood loss anemia This is likely secondary to trauma.  Likely worsened by Plavix. CBC not updated today, but patient did receive 1 unit of packed red blood cells initially when he came in.  Hemoglobin stable yesterday 8.3, down to 7.7 today.  -Follow CBC  #Aspiration pneumonia #Leukocytosis Family concerned patient had concerns of aspiration.  Obtain chest x-ray yesterday which showed concern for new patchy left basilar consolidation concerning for aspiration or infection.  Will start antibiotics as patient had fever as well as has increasing white count. -Day 2 of metronidazole and ceftriaxone -Monitor respiratory status -Follow blood cultures  #Urinary tract infection #Indwelling suprapubic catheter There is some concern that patient may have urinary tract infection.  UA consistent with leukocytes as well as rare bacteria.  White count and increasing.  Patient does have chronic suprapubic catheter.  Will follow cultures. -Continue ceftriaxone day 2 -Follow urine culture  #Type II NSTEMI #CAD status post multiple stents #HFrEF Patient had NSTEMI yesterday likely in the setting of demand from blood loss.  No acute concern for further infarction.  Patient is stable from the standpoint.  Of note, patient has had NSTEMI in the past with two overlapping DES stent to  proximal/mid LAD (09/2020), stent to proximal RCA and stent to in-stent restenosis of mid LAD (03/2022).  Patient does not seem volume overloaded. -Continue Plavix 75 mg daily -Monitor volume status -Cardiology following, appreciate recommendations -Holding GDMT in setting of normotension , Home regimen includes metoprolol succinate 25 mg twice daily, Imdur 60 mg twice daily, hydralazine 50 mg 3 times daily, Lasix 20 mg every other day  #AKI on CKD stage IIIb Patient is having increasing creatinine with creatinine at 2.34 yesterday now up to 2.43. This could have been in the setting of acute blood loss.  Will continue to monitor. -Monitor BMP  #Hypertension Home regimen includes metoprolol succinate 25 mg twice daily, Imdur 60 mg twice daily, hydralazine 50 mg 3 times daily, Lasix 20 mg every other day. -Currently normotensive -Hold  home regimen  #Essential tremor -No acute concerns -Continue home primidone 100 mg 3 times daily  #GERD Some concern from family about patient not able to get his Carafate.  Yesterday, adjusted medication timing and is feeling better now.  -Continue Carafate 1 g 3 times daily as needed before meals -Continue pantoprazole 40 mg twice daily -Continue Pepcid 20 mg nightly  #Hyperlipidemia -Continue Crestor 20 mg nightly  Diet:  Dys 3 IVF: None,None VTE: SCDs Code: Full PT/OT recs: Pending Family Update: Neighbor updated at bedside   Dispo: Anticipated discharge to Home in 4 days pending clinical improvement.   Modena Slater DO Internal Medicine Resident PGY-2 [phone] Please contact the on call pager after 5 pm and on weekends at [phone].

## 2023-04-06 DIAGNOSIS — I2510 Atherosclerotic heart disease of native coronary artery without angina pectoris: Secondary | ICD-10-CM

## 2023-04-06 LAB — CBC
HCT: 22.8 % — ABNORMAL LOW (ref 39.0–52.0)
Hemoglobin: 7.5 g/dL — ABNORMAL LOW (ref 13.0–17.0)
MCH: 30.6 pg (ref 26.0–34.0)
MCHC: 32.9 g/dL (ref 30.0–36.0)
MCV: 93.1 fL (ref 80.0–100.0)
Platelets: 221 10*3/uL (ref 150–400)
RBC: 2.45 MIL/uL — ABNORMAL LOW (ref 4.22–5.81)
RDW: 14.9 % (ref 11.5–15.5)
WBC: 12.5 10*3/uL — ABNORMAL HIGH (ref 4.0–10.5)
nRBC: 0.2 % (ref 0.0–0.2)

## 2023-04-06 LAB — BASIC METABOLIC PANEL
Anion gap: 15 (ref 5–15)
BUN: 96 mg/dL — ABNORMAL HIGH (ref 8–23)
CO2: 16 mmol/L — ABNORMAL LOW (ref 22–32)
Calcium: 8.3 mg/dL — ABNORMAL LOW (ref 8.9–10.3)
Chloride: 117 mmol/L — ABNORMAL HIGH (ref 98–111)
Creatinine, Ser: 2.59 mg/dL — ABNORMAL HIGH (ref 0.61–1.24)
GFR, Estimated: 23 mL/min — ABNORMAL LOW (ref >60)
Glucose, Bld: 170 mg/dL — ABNORMAL HIGH (ref 70–99)
Potassium: 3.5 mmol/L (ref 3.5–5.1)
Sodium: 148 mmol/L — ABNORMAL HIGH (ref 135–145)

## 2023-04-06 MED ORDER — SODIUM CHLORIDE 0.45 % IV SOLN: INTRAVENOUS | Status: AC

## 2023-04-06 MED ORDER — DICLOFENAC SODIUM 1 % EX GEL
2.0000 g | Freq: Four times a day (QID) | CUTANEOUS | Status: DC
  Administered 2023-04-06 – 2023-04-08 (×11): 2 g via TOPICAL
  Filled 2023-04-06: qty 100

## 2023-04-06 MED ORDER — METRONIDAZOLE 500 MG/100ML IV SOLN
500.0000 mg | Freq: Two times a day (BID) | INTRAVENOUS | Status: AC
  Administered 2023-04-06 – 2023-04-07 (×3): 500 mg via INTRAVENOUS
  Filled 2023-04-06 (×4): qty 100

## 2023-04-06 NOTE — Progress Notes (Addendum)
 HD#4 SUBJECTIVE:  Patient Summary: Shawn Andrews is a 89 y.o. past medical history of CAD s/p PCI and stent LAD with DES on aspirin and plavix, HFrEF, tremor, hypertension, hyperlipidemia, CKD stage IIIb, BPH presents to ED today for mechanical fall, sustained multiple cervical fractures, subdural hematoma and facial abrasions.   Overnight Events: None  Interim History: She is evaluate bedside, with son present, denies any concerns at this time.  Denies any chest pain, shortness of breath, abdominal pain. Son at bedside did report that patient was complaining of right shoulder pain yesterday in the afternoon, currently denies any right shoulder pain.  No questions at this point.  OBJECTIVE:  Vital Signs: Vitals:   04/05/23 2332 04/05/23 2349 04/06/23 0336 04/06/23 0809  BP: 108/68 (!) 104/54 (!) 101/57   Pulse: 100  98 95  Resp: 20 18 20 20   Temp: 97.6 F (36.4 C) 98.4 F (36.9 C) 97.9 F (36.6 C) 98.3 F (36.8 C)  TempSrc: Axillary Axillary Axillary Axillary  SpO2: 98% 98% 98% 97%  Weight:      Height:       Supplemental O2: Room Air SpO2: 97 % O2 Flow Rate (L/min): 1 L/min  Filed Weights   04/02/23 1123  Weight: 89.4 kg     Intake/Output Summary (Last 24 hours) at 04/06/2023 0915 Last data filed at 04/05/2023 2100 Gross per 24 hour  Intake 200 ml  Output 650 ml  Net -450 ml   Net IO Since Admission: -2,386 mL [04/06/23 0915]  Physical Exam: Physical Exam General: Laying in bed, answers questions appropriately, no acute distress HEENT: Right periorbital ecchymosis and edema, right eye closed, areas of ecchymosis surrounding the face, ecchymotic lips, c-collar in place Cardiovascular: Regular rate Pulmonary: Breathing comfortably, + faint bibasilar crackles Abdomen: Soft, nontender, nondistended, bowel sounds present Neuro: Awake, able to answer questions appropriately, participating conversation, no focal deficits   Patient Lines/Drains/Airways Status      Active Line/Drains/Airways     Name Placement date Placement time Site Days   Midline Single Lumen 04/04/23 Right Cephalic 8 cm 0 cm 04/04/23  1441  Cephalic  2   Suprapubic Catheter Latex 16 Fr. 08/10/22  1158  Latex  239   Wound / Incision (Open or Dehisced) 04/03/23 Skin tear Arm Lower;Posterior;Right Skin tear to right forearm 04/03/23  0820  Arm  3   Wound / Incision (Open or Dehisced) 04/03/23 Other (Comment);Skin tear Nose Anterior;Circumferential;Bilateral forehead lacerations and nose abrasions 04/03/23  0820  Nose  3             ASSESSMENT/PLAN:  Assessment: Principal Problem:   Fall Active Problems:   Essential hypertension   Benign prostatic hyperplasia   Chronic kidney disease, stage 3b (HCC)   CAD (coronary artery disease)   CKD (chronic kidney disease), stage IIIa   Suprapubic catheter (HCC)   Dyslipidemia   Subdural hematoma (HCC)   Cervical transverse process fracture (HCC)   Fracture of cervical spinous process (HCC)   Troponin level elevated   Plan: Mechanical fall Cervical fractures at C1 and C5 3 mm para falcine subdural hematoma, stable  Large Forehead Laceration (repaired) and hematoma  Facial Abrasions  Presented to ED after mechanical fall without any prodromal symptoms sustaining subdural hematoma, lateral anterior scalp, forehead and periorbital hematomas, forehead laceration, acute displaced fractures of C1 and C5 and facial abrasions. EDP provider repaired forehead laceration. Repeat CTH without any acute changes. NSGY and Trauma on board, appreciate their recommendations. -Neurosurgery recommendations:  -  Continue [place] J collar, follow-up outpatient for C-spine fractures in 4 weeks -Continue pain control with Tylenol 1000 mg as needed, Dilaudid 0.5 mg every 4 hours as needed, oxycodone 5 mg every 4 hours as needed -Continue PT/OT  Aspiration PNA Leukocytosis CXR 3/14 findings of new patchy left basilar consolidation concerning for  aspiration with trace left pleural effusion, had elevated leukocytosis with episode of fever on 3/13.  Blood cultures so far no growth. -Continue IV metronidazole and Rocephin, day 3 -De-escalate as leukocytosis improves and hemodynamically stable  Acute blood loss anemia  Hx Anemia of chronic kidney disease  S/p 1 PRBC, Hgb 7.5 (7.7), no acute sign of bleed. - Will check iron/TIBC/Ferritin labs  - Monitor CBC  - Transfuse goal > Hgb 7   CAD s/p multiple stents  NSTEMI Type 2 NSTEMI s/p two overlapping DES stent to proximal/mid LAD (09/2020), stent to proximal RCA and stent to in-stent restenosis of mid LAD (03/2022). Previously admitted 12/09/2022 for chest pain, NSTEMI w/ elevated troponin levels. 12/11/2022 LHC with patent stents and no definite culprit lesion. Managed medically. Concern about chest tightness, chest pain, diaphoresis 3/13, EKG with out acute changes compared to previous EKG findings. Trop 170>253>421>2714. Considering demand ischemia in the setting of acute bleed. Consulted Cards, appreciate their input, continue medical management.  - Continue Plavix 75 mg daily  - PRN Nitroglycerin SL tablet 0.4 mg    AKI on CKD stage 3B, Baseline Scr ~1.8 BPH Indwelling Suprapubic catheter 2/2 urinary retention  UTI 2/2 chronic indwelling catheter Metabolic Alkalosis  Follows Urology and had it replaced on 03/12/2023, is supposed to get it exchanged every 6 weeks. UA +large leukocytes with Triple Phosphate crystal. Urine culture grew Alcaaligenes faecalis.        - Rocephin 2 g, day 3 for UTI  - Continue Finasteride 5 mg daily  - Avoid nephrotoxic agents   Hypernatremia Na  148 (146), likely due to decrease PO intake.  - NS 0.45% 100 mL/hr for 10 hours  - Monitor BMP  Chronic HFrEF Hypertension Last ECHO 11/2022 showed EF 45 to 50% with regional wall motion abnormalities and normal RV function. Home medications consist of metoprolol 25 mg twice daily, Imdur 60 mg twice daily,  hydralazine 50 mg 3 times daily, Lasix 20 mg every other day. -Hold home GDMT in the setting of hypotension and active bleed   Essential tremors  -Continue home medication primidone 100 mg TID    GERD -Continue home medications: PPI BID, Pepcid 20 mg at bedtime, Sulcrafate 1g BID before meals.    Diverticulosis CTAP noted diverticulosis of descending and sigmoid colon is noted without inflammation. Bowel regimen.    Hyperlipidemia Continue home medication Crestor  Insomnia Continue low dose Seroquel 25 mg at bedtime   Resolved: Epistaxis s/p R nares cautery -Plavix contributed CT Maxillofacial without any evidence of acute maxillofacial fracture. ENT consulted 3/12 for posterior oropharynx bleed. S/p R nare cautery. Stable.  Concern for black stool Had two episodes of melena 3/13. Low suspicion for GI bleed at this time. Likely from digested oropharyn bleed from 3/12.  Best Practice: Diet: DYS 2  IVF: Fluids: 0.45NS, Rate:  100 cc bolus VTE: SCDs Start: 04/02/23 1630 Code: Full AB: As above  Therapy Recs: CIR, DME: none Family Contact: Son , at bedside. DISPO: Anticipated discharge  2-3 days  to  SNF  pending  Medical Management .  Signature: Jeral Pinch, D.O.  Internal Medicine Resident, PGY-1 [facility]  Pager: [phone] 9:15 AM, 04/06/2023   Please contact the on call pager after 5 pm and on weekends at [phone].

## 2023-04-06 NOTE — TOC Progression Note (Signed)
 Transition of Care [facility]) - Progression Note    Patient Details  Name: Shawn Andrews MRN: [id number] Date of Birth: 1928-04-05  Transition of Care [facility]) CM/SW Contact  Dellie Burns [place], [place] Phone Number: 04/06/2023, 11:33 AM  Clinical Narrative: Per CIR admissions, pt's wife refusing CIR and is requesting SNF placement at [facility]. Will begin SNF search and f/u with offers as available.   Dellie Burns, MSW, LCSW [phone] (coverage)        Expected Discharge Plan: IP Rehab Facility Barriers to Discharge: Continued Medical Work up  Expected Discharge Plan and Services                                               Social Determinants of Health (SDOH) Interventions SDOH Screenings   Food Insecurity: No Food Insecurity (04/02/2023)  Housing: Low Risk  (04/02/2023)  Transportation Needs: No Transportation Needs (04/02/2023)  Utilities: Not At Risk (04/02/2023)  Depression (PHQ2-9): Low Risk  (07/17/2022)  Financial Resource Strain: Low Risk  (12/06/2022)   Received from [facility]  Social Connections: Unknown (04/02/2023)  Tobacco Use: High Risk (04/02/2023)    Readmission Risk Interventions     No data to display

## 2023-04-06 NOTE — NC FL2 (Signed)
 [place] MEDICAID FL2 LEVEL OF CARE FORM     IDENTIFICATION  Patient Name: Shawn Andrews Birthdate: 1928/11/25 Sex: male Admission Date (Current Location): 04/02/2023  [facility] and [place] Number:  Producer, television/film/video and Address:  The [place], [address], [place], [place] [address]      Provider Number: [id number]  Attending Physician Name and Address:  Gust Rung, DO  Relative Name and Phone Number:       Current Level of Care: Hospital Recommended Level of Care: Skilled Nursing Facility Prior Approval Number:    Date Approved/Denied:   PASRR Number: [id number]  Discharge Plan: SNF    Current Diagnoses: Patient Active Problem List   Diagnosis Date Noted   Troponin level elevated 04/04/2023   Fall 04/02/2023   Subdural hematoma (HCC) 04/02/2023   Cervical transverse process fracture (HCC) 04/02/2023   Fracture of cervical spinous process (HCC) 04/02/2023   Dyslipidemia 12/09/2022   BPH (benign prostatic hyperplasia) 12/09/2022   Frequent PVCs 03/25/2022   Overweight (BMI 25.0-29.9) 10/04/2021   Generalized abdominal pain    Lower abdominal pain 02/26/2021   UTI (urinary tract infection) 02/25/2021   Suprapubic catheter (HCC) 02/25/2021   CKD (chronic kidney disease), stage IIIa 01/17/2021   Chronic systolic CHF (congestive heart failure) (HCC) 01/17/2021   Pleural effusion 01/17/2021   Acute on chronic systolic CHF (congestive heart failure) (HCC) 01/17/2021   Constipation 12/13/2020   Hyponatremia 12/13/2020   NSTEMI (non-ST elevated myocardial infarction) (HCC) 10/16/2020   HFrEF (heart failure with reduced ejection fraction) (HCC) 10/16/2020   CAD (coronary artery disease) 10/08/2020   Elevated vitamin B12 level 08/31/2020   Leg DVT (deep venous thromboembolism), acute, left (HCC) 08/09/2019   Skin lesion 07/18/2016   Health maintenance examination 07/19/2015   Advanced care planning/counseling discussion 07/05/2014    Memory deficit 07/05/2014   Chronic kidney disease, stage 3b (HCC) 06/23/2013   Post-nasal drainage 12/22/2012   Medicare annual wellness visit, subsequent 06/15/2012   Lumbosacral radiculopathy at L5 06/17/2011   High frequency hearing loss 10/18/2010   Vitamin D deficiency 11/15/2008   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   Essential hypertension 05/22/2003   HLD (hyperlipidemia) 01/21/2002   Essential tremor 01/22/2000   ELEVATED PROSTATE SPECIFIC ANTIGEN 01/22/2000   Benign prostatic hyperplasia 01/21/1998   Osteoarthritis 01/22/1988    Orientation RESPIRATION BLADDER Height & Weight     Self, Time, Situation, Place  Normal Continent Weight: 197 lb (89.4 kg) Height:  5\' 10"  (177.8 cm)  BEHAVIORAL SYMPTOMS/MOOD NEUROLOGICAL BOWEL NUTRITION STATUS      Continent    AMBULATORY STATUS COMMUNICATION OF NEEDS Skin   Limited Assist Verbally Normal                       Personal Care Assistance Level of Assistance  Bathing, Feeding, Dressing Bathing Assistance: Limited assistance Feeding assistance: Limited assistance Dressing Assistance: Limited assistance     Functional Limitations Info  Sight, Hearing, Speech Sight Info: Impaired Hearing Info: Adequate Speech Info: Adequate    SPECIAL CARE FACTORS FREQUENCY  PT (By licensed PT), OT (By licensed OT)                    Contractures Contractures Info: Not present    Additional Factors Info  Code Status Code Status Info: FULL CODE             Current Medications (04/06/2023):  This is the current hospital active medication  list Current Facility-Administered Medications  Medication Dose Route Frequency Provider Last Rate Last Admin   0.45 % sodium chloride infusion   Intravenous Continuous Modena Slater, DO       acetaminophen (TYLENOL) tablet 1,000 mg  1,000 mg Oral Q6H PRN Eric Form, PA-C       bacitracin ointment   Topical BID Eric Form, PA-C   31.5 Application at 04/06/23 0947   cefTRIAXone  (ROCEPHIN) 2 g in sodium chloride 0.9 % 100 mL IVPB  2 g Intravenous Q24H Tawkaliyar, Roya, DO 200 mL/hr at 04/06/23 1051 2 g at 04/06/23 1051   clopidogrel (PLAVIX) tablet 75 mg  75 mg Oral Daily Tawkaliyar, Roya, DO   75 mg at 04/06/23 6433   cyanocobalamin (VITAMIN B12) tablet 1,000 mcg  1,000 mcg Oral Daily Tawkaliyar, Roya, DO   1,000 mcg at 04/06/23 2951   diclofenac Sodium (VOLTAREN) 1 % topical gel 2 g  2 g Topical QID Modena Slater, DO   2 g at 04/06/23 1052   docusate sodium (COLACE) capsule 100 mg  100 mg Oral Daily Tawkaliyar, Roya, DO   100 mg at 04/06/23 0950   famotidine (PEPCID) tablet 20 mg  20 mg Oral QHS Tawkaliyar, Roya, DO   20 mg at 04/05/23 2056   feeding supplement (ENSURE ENLIVE / ENSURE PLUS) liquid 237 mL  237 mL Oral BID BM Carlynn Purl C, DO       finasteride (PROSCAR) tablet 5 mg  5 mg Oral Daily Tawkaliyar, Roya, DO   5 mg at 04/06/23 8841   HYDROmorphone (DILAUDID) injection 0.5 mg  0.5 mg Intravenous Q4H PRN Tawkaliyar, Roya, DO   0.5 mg at 04/03/23 0406   metroNIDAZOLE (FLAGYL) IVPB 500 mg  500 mg Intravenous Q12H Patel, Amar, DO       nitroGLYCERIN (NITROSTAT) SL tablet 0.4 mg  0.4 mg Sublingual PRN Carmina Miller, DO       oxidized cellulose (Surgicel) pad 1 each  1 each Topical Once Modena Slater, DO       oxyCODONE (Oxy IR/ROXICODONE) immediate release tablet 2.5-5 mg  2.5-5 mg Oral Q4H PRN Eric Form, PA-C   5 mg at 04/05/23 2056   pantoprazole (PROTONIX) EC tablet 40 mg  40 mg Oral BID Tawkaliyar, Roya, DO   40 mg at 04/06/23 0939   primidone (MYSOLINE) tablet 100 mg  100 mg Oral TID Tawkaliyar, Roya, DO   100 mg at 04/06/23 6606   QUEtiapine (SEROQUEL) tablet 25 mg  25 mg Oral QHS Tawkaliyar, Roya, DO   25 mg at 04/05/23 2106   rosuvastatin (CRESTOR) tablet 20 mg  20 mg Oral QHS Tawkaliyar, Roya, DO   20 mg at 04/05/23 2056   sodium chloride flush (NS) 0.9 % injection 10-40 mL  10-40 mL Intracatheter Q12H Carlynn Purl C, DO   10 mL at 04/06/23 0948    sodium chloride flush (NS) 0.9 % injection 10-40 mL  10-40 mL Intracatheter PRN Gust Rung, DO       sucralfate (CARAFATE) 1 GM/10ML suspension 1 g  1 g Oral TID PRN Tawkaliyar, Roya, DO   1 g at 04/06/23 1054     Discharge Medications: Please see discharge summary for a list of discharge medications.  Relevant Imaging Results:  Relevant Lab Results:   Additional Information SS# [id number]  Deatra [place], [place]

## 2023-04-07 ENCOUNTER — Inpatient Hospital Stay (HOSPITAL_COMMUNITY)

## 2023-04-07 DIAGNOSIS — J90 Pleural effusion, not elsewhere classified: Secondary | ICD-10-CM

## 2023-04-07 DIAGNOSIS — S065XAA Traumatic subdural hemorrhage with loss of consciousness status unknown, initial encounter: Secondary | ICD-10-CM | POA: Diagnosis not present

## 2023-04-07 DIAGNOSIS — D62 Acute posthemorrhagic anemia: Secondary | ICD-10-CM | POA: Diagnosis not present

## 2023-04-07 DIAGNOSIS — N179 Acute kidney failure, unspecified: Secondary | ICD-10-CM

## 2023-04-07 DIAGNOSIS — I2510 Atherosclerotic heart disease of native coronary artery without angina pectoris: Secondary | ICD-10-CM | POA: Diagnosis not present

## 2023-04-07 DIAGNOSIS — T17908A Unspecified foreign body in respiratory tract, part unspecified causing other injury, initial encounter: Secondary | ICD-10-CM | POA: Diagnosis not present

## 2023-04-07 DIAGNOSIS — N1832 Chronic kidney disease, stage 3b: Secondary | ICD-10-CM | POA: Diagnosis not present

## 2023-04-07 DIAGNOSIS — J690 Pneumonitis due to inhalation of food and vomit: Secondary | ICD-10-CM

## 2023-04-07 DIAGNOSIS — I249 Acute ischemic heart disease, unspecified: Secondary | ICD-10-CM | POA: Diagnosis not present

## 2023-04-07 LAB — URINALYSIS, ROUTINE W REFLEX MICROSCOPIC
Bilirubin Urine: NEGATIVE
Glucose, UA: NEGATIVE mg/dL
Ketones, ur: NEGATIVE mg/dL
Nitrite: NEGATIVE
Protein, ur: 30 mg/dL — AB
Specific Gravity, Urine: 1.02 (ref 1.005–1.030)
pH: 5 (ref 5.0–8.0)

## 2023-04-07 LAB — BASIC METABOLIC PANEL
Anion gap: 12 (ref 5–15)
Anion gap: 12 (ref 5–15)
BUN: 106 mg/dL — ABNORMAL HIGH (ref 8–23)
BUN: 112 mg/dL — ABNORMAL HIGH (ref 8–23)
CO2: 16 mmol/L — ABNORMAL LOW (ref 22–32)
CO2: 18 mmol/L — ABNORMAL LOW (ref 22–32)
Calcium: 7.7 mg/dL — ABNORMAL LOW (ref 8.9–10.3)
Calcium: 8.1 mg/dL — ABNORMAL LOW (ref 8.9–10.3)
Chloride: 116 mmol/L — ABNORMAL HIGH (ref 98–111)
Chloride: 116 mmol/L — ABNORMAL HIGH (ref 98–111)
Creatinine, Ser: 3.04 mg/dL — ABNORMAL HIGH (ref 0.61–1.24)
Creatinine, Ser: 3.28 mg/dL — ABNORMAL HIGH (ref 0.61–1.24)
GFR, Estimated: 19 mL/min — ABNORMAL LOW (ref >60)
GFR, Estimated: 17 mL/min — ABNORMAL LOW (ref >60)
Glucose, Bld: 182 mg/dL — ABNORMAL HIGH (ref 70–99)
Glucose, Bld: 192 mg/dL — ABNORMAL HIGH (ref 70–99)
Potassium: 3.7 mmol/L (ref 3.5–5.1)
Potassium: 4 mmol/L (ref 3.5–5.1)
Sodium: 144 mmol/L (ref 135–145)
Sodium: 146 mmol/L — ABNORMAL HIGH (ref 135–145)

## 2023-04-07 LAB — BLOOD GAS, ARTERIAL
Acid-base deficit: 4.9 mmol/L — ABNORMAL HIGH (ref 0.0–2.0)
Bicarbonate: 17.7 mmol/L — ABNORMAL LOW (ref 20.0–28.0)
Drawn by: 5267
O2 Saturation: 97.6 %
Patient temperature: 36.1
pCO2 arterial: 25 mmHg — ABNORMAL LOW (ref 32–48)
pH, Arterial: 7.45 (ref 7.35–7.45)
pO2, Arterial: 70 mmHg — ABNORMAL LOW (ref 83–108)

## 2023-04-07 LAB — CREATININE, URINE, RANDOM: Creatinine, Urine: 158 mg/dL

## 2023-04-07 LAB — CBC
HCT: 20.3 % — ABNORMAL LOW (ref 39.0–52.0)
HCT: 27.4 % — ABNORMAL LOW (ref 39.0–52.0)
Hemoglobin: 6.6 g/dL — CL (ref 13.0–17.0)
Hemoglobin: 9.2 g/dL — ABNORMAL LOW (ref 13.0–17.0)
MCH: 30.1 pg (ref 26.0–34.0)
MCH: 30.8 pg (ref 26.0–34.0)
MCHC: 32.5 g/dL (ref 30.0–36.0)
MCHC: 33.6 g/dL (ref 30.0–36.0)
MCV: 92.7 fL (ref 80.0–100.0)
MCV: 91.6 fL (ref 80.0–100.0)
Platelets: 227 10*3/uL (ref 150–400)
Platelets: 235 10*3/uL (ref 150–400)
RBC: 2.19 MIL/uL — ABNORMAL LOW (ref 4.22–5.81)
RBC: 2.99 MIL/uL — ABNORMAL LOW (ref 4.22–5.81)
RDW: 15.3 % (ref 11.5–15.5)
RDW: 14.7 % (ref 11.5–15.5)
WBC: 12.4 10*3/uL — ABNORMAL HIGH (ref 4.0–10.5)
WBC: 13.7 10*3/uL — ABNORMAL HIGH (ref 4.0–10.5)
nRBC: 0.6 % — ABNORMAL HIGH (ref 0.0–0.2)
nRBC: 1 % — ABNORMAL HIGH (ref 0.0–0.2)

## 2023-04-07 LAB — PREPARE RBC (CROSSMATCH)

## 2023-04-07 LAB — SODIUM, URINE, RANDOM: Sodium, Ur: 14 mmol/L

## 2023-04-07 LAB — FERRITIN: Ferritin: 82 ng/mL (ref 24–336)

## 2023-04-07 LAB — IRON AND TIBC
Iron: 24 ug/dL — ABNORMAL LOW (ref 45–182)
Saturation Ratios: 11 % — ABNORMAL LOW (ref 17.9–39.5)
TIBC: 230 ug/dL — ABNORMAL LOW (ref 250–450)
UIBC: 206 ug/dL

## 2023-04-07 LAB — MRSA NEXT GEN BY PCR, NASAL: MRSA by PCR Next Gen: NOT DETECTED

## 2023-04-07 LAB — BLOOD GAS, VENOUS
Acid-base deficit: 4.8 mmol/L — ABNORMAL HIGH (ref 0.0–2.0)
Bicarbonate: 19.7 mmol/L — ABNORMAL LOW (ref 20.0–28.0)
Drawn by: 70495
O2 Saturation: 27 %
Patient temperature: 37.5
pCO2, Ven: 35 mmHg — ABNORMAL LOW (ref 44–60)
pH, Ven: 7.36 (ref 7.25–7.43)
pO2, Ven: <31 mmHg — CL (ref 32–45)

## 2023-04-07 LAB — LACTIC ACID, PLASMA: Lactic Acid, Venous: 1.9 mmol/L (ref 0.5–1.9)

## 2023-04-07 MED ORDER — FAMOTIDINE 20 MG PO TABS
10.0000 mg | ORAL_TABLET | Freq: Every day | ORAL | Status: DC
  Administered 2023-04-07: 10 mg via ORAL
  Filled 2023-04-07: qty 1

## 2023-04-07 MED ORDER — SODIUM CHLORIDE 0.9% IV SOLUTION: Freq: Once | INTRAVENOUS | Status: AC

## 2023-04-07 MED ORDER — ROSUVASTATIN CALCIUM 5 MG PO TABS
10.0000 mg | ORAL_TABLET | Freq: Every day | ORAL | Status: DC
  Administered 2023-04-07: 10 mg via ORAL
  Filled 2023-04-07: qty 2

## 2023-04-07 MED ORDER — LIDOCAINE-EPINEPHRINE-TETRACAINE (LET) TOPICAL GEL
3.0000 mL | Freq: Once | TOPICAL | Status: DC
  Filled 2023-04-07 (×2): qty 3

## 2023-04-07 MED ORDER — SODIUM CHLORIDE 3 % IN NEBU
4.0000 mL | INHALATION_SOLUTION | Freq: Every day | RESPIRATORY_TRACT | Status: DC
Start: 2023-04-07 — End: 2023-04-10
  Administered 2023-04-08: 4 mL via RESPIRATORY_TRACT
  Filled 2023-04-07 (×3): qty 4

## 2023-04-07 MED ORDER — LINEZOLID 600 MG/300ML IV SOLN
600.0000 mg | Freq: Two times a day (BID) | INTRAVENOUS | Status: DC
  Administered 2023-04-07: 600 mg via INTRAVENOUS
  Filled 2023-04-07 (×2): qty 300

## 2023-04-07 MED ORDER — FUROSEMIDE 10 MG/ML IJ SOLN
40.0000 mg | Freq: Once | INTRAMUSCULAR | Status: DC
  Filled 2023-04-07: qty 4

## 2023-04-07 NOTE — Progress Notes (Addendum)
 Patient Name: Shawn Andrews Date of Encounter: 04/07/2023 [facility] Cardiologist: Dr. Juliann Pares  Interval Summary  .    Pt is alert but not oriented to place. Wife at bedside helps with history.   Vital Signs .    Vitals:   04/06/23 1146 04/06/23 1900 04/06/23 2300 04/07/23 0834  BP: (!) 103/57 108/63 109/60 97/75  Pulse: 94 89 (!) 109 (!) 120  Resp: 20 20 20    Temp: 98 F (36.7 C) 99.1 F (37.3 C) 98.5 F (36.9 C) 99.3 F (37.4 C)  TempSrc: Oral Axillary Axillary Axillary  SpO2: 98% 100% 96% 100%  Weight:      Height:        Intake/Output Summary (Last 24 hours) at 04/07/2023 0854 Last data filed at 04/07/2023 0600 Gross per 24 hour  Intake 100 ml  Output 250 ml  Net -150 ml      04/02/2023   11:23 AM 01/29/2023    8:57 AM 12/17/2022    8:44 AM  Last 3 Weights  Weight (lbs) 197 lb 182 lb 182 lb  Weight (kg) 89.359 kg 82.555 kg 82.555 kg      Telemetry/ECG    N/A - Personally Reviewed  Physical Exam .   GEN: No acute distress.   Neck: in C-collar Cardiac: RRR, no murmurs, rubs, or gallops.  Respiratory: Clear to auscultation on anterior exam GI: Soft, nontender, non-distended  MS: No edema  Assessment & Plan .     Shawn Andrews is a 89 y.o. male with a significant history of multivessel coronary artery disease status post drug-eluting stents, heart failure with reduced ejection fraction, stage IIIb chronic kidney disease, hypertension, gastro-esophageal reflux disease, hypercholesteremia, prior deep vein thrombosis, benign prostate hypertrophy who is being seen 04/04/2023 for the evaluation of chest pain elevated HS-troponin at the request of Dr. Ned Card.   Elevated troponin - demand ischemia HS troponin trended to 2714 Elevated HS-troponin in the setting of acute blood loss anemia requiring blood transfusion with possible type II myocardial infarction (demand ischemia) and subdural hematoma (known etiology for elevated troponin especially in the  context of traumatic brain injury). Acute episodes of chest pain earlier based on patient's description and concern likely non-cardiac. Low suspicion for type I myocardial infarction with NSTEMI-ACS at this time with coronary anatomy unlikely changed from his most recent coronary angiography.  - continued on plavix initially, OK with neurosurgery   Acute on chronic anemia Hb 6.6 today, trended down from 10.9 on admission Baseline seems to be around 11 - remains on plavix - Ok'ed by neurosurgery - receiving 2U PRBC today - will discuss with Dr. Lynnette Caffey regarding plavix hold given ongoing anemia - difficult situation given his 60% proximal LAD disease, LCX and RCA disease   CAD - most recent PCI was 03/2022 with DES-RCA - heart cath 11/2022 with no intervention - no recent stroke - no chest pain, but mild confusion this morning - continue statin   Mild cardiomyopathy - LVEF 45-50% on echo 11/2022 - now requiring O2 [place] - would hold additional IVF and consider 40 mg IV lasix between units of blood this morning - can consider rechecking a limited echo today      For questions or updates, please contact [facility] Please consult [email or web address] for contact info under        Signed, Marcelino Duster, PA   ATTENDING ATTESTATION:  After conducting a review of all available clinical information with the care team,  interviewing the patient, and performing a physical exam, I agree with the findings and plan described in this note.   GEN: No acute distress.   HEENT:  MMM, no JVD, no scleral icterus, scattered facial ecchymoses; in C-collar Cardiac: RRR, no murmurs, rubs, or gallops.  Respiratory: Clear to auscultation bilaterally. GI: Soft, nontender, non-distended  MS: No edema; No deformity. Neuro:  Nonfocal  Vasc:  +2 radial pulses  Patient remains chest pain free.  Hgb down to 6.6.  Unclear source of ongoing blood loss.  For now, will continue plavix 75mg  qday  (patient had been on DAPT since March 2024 PCI RCA).  We may need to hold this if bleeding continues.  Continue medical therapy for type 2 MI (due to acute anemia) with plavix, rosuvastatin, BP control.  Lasix ordered due to oxygen requirement (to be given between PRBCs).  CXR clear.  Other medical issues per medicine service.    Alverda Skeans, MD Pager [phone]

## 2023-04-07 NOTE — Progress Notes (Signed)
 HD#5 SUBJECTIVE:  Patient Summary:  Shawn Andrews is a 89 y.o. past medical history of CAD s/p PCI and stent LAD with DES on aspirin and plavix, HFrEF, tremor, hypertension, hyperlipidemia, CKD stage IIIb, BPH presents to ED today for mechanical fall, sustained multiple cervical fractures, subdural hematoma and facial abrasions.   Overnight Events: None  Interim History: Patient is evaluated at bedside, has family present and Wife. Patient denied any chest pain, shortness of breath, or abdominal pain. He is not oriented to location, reports that he has to go get his car, thinks he is in his house. He still does not have his hearing aids on. Wife reports that it is in his house. States that his eye sight is also poor and his glasses were crushed during the fall.    OBJECTIVE:  Vital Signs: Vitals:   04/06/23 0809 04/06/23 1146 04/06/23 1900 04/06/23 2300  BP:  (!) 103/57 108/63 109/60  Pulse: 95 94 89 (!) 109  Resp: 20 20 20 20   Temp: 98.3 F (36.8 C) 98 F (36.7 C) 99.1 F (37.3 C) 98.5 F (36.9 C)  TempSrc: Axillary Oral Axillary Axillary  SpO2: 97% 98% 100% 96%  Weight:      Height:       Supplemental O2: Room Air SpO2: 96 % O2 Flow Rate (L/min): 1 L/min  Filed Weights   04/02/23 1123  Weight: 89.4 kg     Intake/Output Summary (Last 24 hours) at 04/07/2023 0759 Last data filed at 04/07/2023 0600 Gross per 24 hour  Intake 100 ml  Output 250 ml  Net -150 ml   Net IO Since Admission: -2,536 mL [04/07/23 0759]  Physical Exam: Physical Exam  General: Laying in bed, answering questions, hard of hearing  HEENT: Right periorbital ecchymosis, right eye closed, areas of ecchymosis surrounding the face, ecchymotic lips, c-collar in place Cardiovascular: Tachycardiac rate  Pulmonary: Breathing is labored, +upper airway sounds through out lung fields and rhonchi  Abdomen: Soft, nontender, nondistended, bowel sounds present Neuro: Awake, not oriented to place.   Patient  Lines/Drains/Airways Status     Active Line/Drains/Airways     Name Placement date Placement time Site Days   Midline Single Lumen 04/04/23 Right Cephalic 8 cm 0 cm 04/04/23  1441  Cephalic  3   Suprapubic Catheter Latex 16 Fr. 08/10/22  1158  Latex  240   Wound / Incision (Open or Dehisced) 04/03/23 Skin tear Arm Lower;Posterior;Right Skin tear to right forearm 04/03/23  0820  Arm  4   Wound / Incision (Open or Dehisced) 04/03/23 Other (Comment);Skin tear Nose Anterior;Circumferential;Bilateral forehead lacerations and nose abrasions 04/03/23  0820  Nose  4             ASSESSMENT/PLAN:  Assessment: Principal Problem:   Fall Active Problems:   Essential hypertension   Benign prostatic hyperplasia   Chronic kidney disease, stage 3b (HCC)   CAD (coronary artery disease)   CKD (chronic kidney disease), stage IIIa   Suprapubic catheter (HCC)   Dyslipidemia   Subdural hematoma (HCC)   Cervical transverse process fracture (HCC)   Fracture of cervical spinous process (HCC)   Troponin level elevated   Plan: Mechanical fall Cervical fractures at C1 and C5 3 mm para falcine subdural hematoma, stable  Large Forehead Laceration (repaired) and hematoma  Facial Abrasions  Presented to ED after mechanical fall without any prodromal symptoms sustaining subdural hematoma, lateral anterior scalp, forehead and periorbital hematomas, forehead laceration, acute displaced fractures of  C1 and C5 and facial abrasions. EDP provider repaired forehead laceration. Repeat CTH 3/12 without any acute changes. NSGY and Trauma on board, appreciate their recommendations.  On evaluation today, He reports neck pain where the C collar is placed. Disoriented to location and situation, appears to be acquiring hospital delirium.    -STAT CT head wo contrast  -Continue [place] J collar, follow-up outpatient for C-spine fractures in 4 weeks with neurosurgery  -Continue pain control with Tylenol 1000 mg as needed  and oxycodone 5 mg every 4 hours as needed -Continue PT/OT  Hospital acquired delirium  Insomnia He appears deliriums this morning, not oriented to location. He is not wearing his hearing aids and additionally has poor vision. Wife at bedside was informed to bring pt's hearing aids from home.  - Encourage day time orientation  - Seroquel at bedtime for sleep  - Delirious precautions    Aspiration PNA Leukocytosis CXR 3/17 shows worsening LLL opacity compared to CXR 3/14. He is tachycardiac and Tachypneic. I am concerned about worsening pneumonia or possible parapneumonic effusion, concern for sepsis.  - STAT CT Chest wo contrast - Follow up on MRSA swab  - Follow up on Lactic acid  - Continue IV metronidazole and Rocephin, day 4, may need prolong course of antibiotics    Acute blood loss anemia  Hx Anemia of chronic kidney disease  Concern for black stool Had two episodes of melena 3/13. Likely from digested oropharyn bleed from 3/12. S/p 1 PRBC 3/12; Hgb 6.6 (7.5) AM. Iron 24, TIBC 230, Sat 11. He is receiving 2 units of PRBC. I am concerned about possible bleed, unclear location at this time, will follow up PM repeat CBC. If CBC continues to drop, low threshold to contact GI for EGD.  - Trend CBC  - Transfuse goal > Hgb 7    AKI on CKD stage 3B, Baseline Scr ~1.8 BPH Indwelling Suprapubic catheter 2/2 urinary retention  UTI 2/2 chronic indwelling catheter Metabolic Alkalosis  Follows Urology and had it replaced on 03/12/2023, is supposed to get it exchanged every 6 weeks. UA +large leukocytes with Triple Phosphate crystal. Urine culture 3/12 grew Alcaaligenes faecalis. His kidney functions are worsening today, received 1L NS 3/16 for hypernatremia. On exam, appears dry. Will hold lasix for now.  - Exchange foley catheter - Repeat UA with culture        - Rocephin 2 g, day 4 for UTI  - Continue Finasteride 5 mg daily  - Avoid nephrotoxic agents  CAD s/p multiple stents  NSTEMI  Type 2 NSTEMI s/p two overlapping DES stent to proximal/mid LAD (09/2020), stent to proximal RCA and stent to in-stent restenosis of mid LAD (03/2022). Previously admitted 12/09/2022 for chest pain, NSTEMI w/ elevated troponin levels. 12/11/2022 LHC with patent stents and no definite culprit lesion. Managed medically. Concern about chest tightness, chest pain, diaphoresis 3/13, EKG with out acute changes compared to previous EKG findings. Trop 170>253>421>2714. Considering demand ischemia in the setting of acute bleed. Consulted Cards, appreciate their input, continue medical management.   Appears tachycardiac and Tachypneic on exam. Will follow repeat EKG - Continue Plavix 75 mg daily  - PRN Nitroglycerin SL tablet 0.4 mg    Hypernatremia S/p NS 0.45% 100 mL/hr for 10 hours 3/16. - Na 144  - Monitor BMP   Chronic HFrEF Hypertension Last ECHO 11/2022 showed EF 45 to 50% with regional wall motion abnormalities and normal RV function. Home medications consist of metoprolol 25 mg twice daily, Imdur  60 mg twice daily, hydralazine 50 mg 3 times daily, Lasix 20 mg every other day. -Hold home GDMT in the setting of hypotension and active bleed   Essential tremors  -Continue home medication primidone 100 mg TID    GERD -Continue home medications: PPI BID, Pepcid 20 mg at bedtime, Sulcrafate 1g BID before meals.    Diverticulosis CTAP noted diverticulosis of descending and sigmoid colon is noted without inflammation. Bowel regimen.    Hyperlipidemia Continue home medication Crestor    Resolved: Epistaxis s/p R nares cautery -Plavix contributed CT Maxillofacial without any evidence of acute maxillofacial fracture. ENT consulted 3/12 for posterior oropharynx bleed. S/p R nare cautery. Stable.   Best Practice: Diet: DYS 2  IVF: Fluids: None, Rate: None VTE: SCDs Start: 04/02/23 1630 Code: Full AB: As above  Therapy Recs: SNF, DME: none Family Contact: Family, at bedside. DISPO:  Anticipated discharge  3-5 days  to Skilled nursing facility pending  medical management .  Signature: Jeral Pinch, D.O.  Internal Medicine Resident, PGY-1 [facility]  Pager: [phone] 7:59 AM, 04/07/2023   Please contact the on call pager after 5 pm and on weekends at [phone].

## 2023-04-07 NOTE — Progress Notes (Signed)
 Speech Language Pathology Treatment: Dysphagia;Cognitive-Linquistic  Patient Details Name: Shawn Andrews MRN: [id number] DOB: Aug 08, 1928 Today's Date: 04/07/2023 Time: 4098-1191 SLP Time Calculation (min) (ACUTE ONLY): 21 min  Assessment / Plan / Recommendation Clinical Impression  Pt is significantly more disoriented than during prior SLP treatment sessions. He was previously oriented x4, but is now only oriented to self. Pt is using confabulatory language with poor carryover of errorless learning techniques. Provided education to family regarding immediate correction of errors and reinforced recommendations from MD to place hearing aids and glasses. Pt fed himself trials of nectar thick liquids without overt s/s of aspiration. He declined any trials of solids. Recommend continuing current diet of Dys 2 solids and nectar thick liquids. Given change in mentation, will plan to repeat MBS as scheduling allows. Discussed with RN and will f/u.    HPI HPI: Shawn Andrews is a 89 yo male presenting to ED 3/12 after a mechanical fall down a 10 ft embankment with multiple facial abrasions as well as a scalp laceration and hematoma. Imaging reveals 4 mm para falcine SDH without midline shift and displaced fxs of L C1 and C5 in addition to stenosis at C5-6 with multilevel bony narrowing. Significant epistaxis requiring cauterization by ENT. Esophagram 12/14/22 shows mild gastroesophageal reflux but no stricture, mass, or hiatal hernia. Pt's wife reports this is managed well with medication.   PMH includes CAD s/p stent placement 2024 on ASA and plavix, CHF, BPH requiring SP tube placement, HTN, CKD, HLD      SLP Plan  MBS      Recommendations for follow up therapy are one component of a multi-disciplinary discharge planning process, led by the attending physician.  Recommendations may be updated based on patient status, additional functional criteria and insurance authorization.    Recommendations  Diet  recommendations: Dysphagia 2 (fine chop);Nectar-thick liquid Liquids provided via: Cup;Straw Medication Administration: Crushed with puree Supervision: Staff to assist with self feeding;Intermittent supervision to cue for compensatory strategies;Trained caregiver to feed patient Compensations: Minimize environmental distractions;Slow rate;Small sips/bites Postural Changes and/or Swallow Maneuvers: Seated upright 90 degrees                  Oral care BID   Frequent or constant Supervision/Assistance Cognitive communication deficit (R41.841);Dysphagia, unspecified (R13.10)     MBS     Gwynneth Aliment, M.A., CF-SLP Speech Language Pathology, Acute Rehabilitation Services  Secure Chat preferred [phone]   04/07/2023, 3:25 PM

## 2023-04-07 NOTE — Progress Notes (Signed)
 Red MEWS, VS reported to physician as well as change in respiratory assessment since yesterday. Patient responds to verbal commands and neuro assessment stable. Will increase vitals to Q1hr x2 and await results of CXR. Wife at bedside updated on plan of care. Call light in reach will continue to monitor.

## 2023-04-07 NOTE — Progress Notes (Signed)
 Notified by Gardiner Barefoot, RN that family want to talk about code status. Wife, grandson, granddaughter, others at bedside. We discussed Shawn Andrews acute decline over a short span of time. I expressed concern that he is in multisystem failure at this point and I am worried about him. I appreciate the conversation that Dr. Vassie Loll and Selmer Dominion had prior to my encounter with this person and their family. The family agrees that we should continue working up for and treating reversible causes of his decline, but that if he deteriorated to the point his heart stops or he stops breathing, we should not perform CPR or intubate, as he would not likely survive these interventions. I will change code status to reflect wish for DNR/DNI, and we will continue to monitor his status closely overnight.  Marrianne Mood MD 04/07/2023, 7:31 PM

## 2023-04-07 NOTE — Progress Notes (Signed)
 Date and time results received: 04/07/23 1502 Test: Venous blood gas  Critical Value: pO2 < 31  Name of Provider Notified: Dr. Allena Katz   Orders Received? Or Actions Taken?: Orders Received - See Orders for details  Pt transported to CT with nurse at bedside without incident.

## 2023-04-07 NOTE — Progress Notes (Signed)
 Patient ID: Shawn Andrews, male   DOB: [birth year], 89 y.o.   MRN: [id number] BP 106/76 (BP Location: Left Arm)   Pulse 99   Temp 99.6 F (37.6 C) (Axillary)   Resp (!) 36   Ht 5\' 10"  (1.778 m)   Wt 89.4 kg   SpO2 99%   BMI 28.27 kg/m  Head CT reviewed. No operative indications, no shift, ventricular or cisternal effacement. Would not implicate this minimal change in the subdural as the reason for a mental status change

## 2023-04-07 NOTE — Progress Notes (Signed)
 PT Cancellation Note  Patient Details Name: Shawn Andrews MRN: [id number] DOB: Jun 07, 1928   Cancelled Treatment:    Reason Eval/Treat Not Completed: Medical issues which prohibited therapy (hgb 6.6; awaiting blood transfusion).  Lillia Pauls, PT, DPT Acute Rehabilitation Services Office [phone]    Norval Morton 04/07/2023, 9:52 AM

## 2023-04-07 NOTE — Consult Note (Signed)
 Shawn Andrews, MRN:  [id number], DOB:  12-05-1928, LOS: 5 ADMISSION DATE:  04/02/2023, CONSULTATION DATE:  04/07/23 REFERRING MD:  Danise Edge, CHIEF COMPLAINT:   tachypnea/WOB  History of Present Illness:   62 yoM with PMH of CAD s/p PCI (04/2022) on ASA/ plavix, HFrEF, HTN, HLD, CKDIIb, BPH, tremor, and chronic suprapubic catheter who presented after a mechanical fall, no LOC, 3/12 sustaining facial laceration, C1 and C5 fracture, SDH, and epistaxis requiring R nare cauterization w/ ENT.  NSGY following.  Admitted to IMTS. Developed melena but suspected due to ingested blood from epistaxis, Hgb dropped initially from 10.9, been holding 7-8 but 6.6 today, completing 2nd unit of PRBC.  Found to have LLL aspiration PNA, started on ctx and flagyl 3/14, since with improving WBC, afebrile.  UC > 100k alcaligenes faecalis.  Also found to have NSTEMI, felt to be type II, cardiology following.  Developed confusion 3/16, suspected to be delirium with repeat CTH pending with worsening AKI, and increasing tachypnea but and intermittent O2 requirement.  Went for CT chest 3/17 given worsening LLL opacity on CXR which showed bibasilar PNA/ atelectasis with L> R pleural effusion.  PCCM consulted for further recommendations.   Wife/ family report pt was fully independent and functional pta despite PMH, and goes to [facility] 3x /week.   Pertinent  Medical History  CAD s/p PCI (04/2022) on ASA/ plavix, HFrEF, HTN, HLD, CKDIIb, BPH, tremor, chronic suprapubic catheter, HOH and visual issues   Significant Hospital Events: Including procedures, antibiotic start and stop dates in addition to other pertinent events   3/12 admitted 3/17 PCCM consult  Interim History / Subjective:  Less oriented today compared to yest, rambling speech, sleeping more per family On 2nd unit of PRBC for Hgb drop 7.5> 6.6 On RA currently but remains tachypnic  Worsening renal function, oliguria   Objective   Blood pressure 137/64, pulse (!)  108, temperature (!) 97 F (36.1 C), resp. rate (!) 38, height 5\' 10"  (1.778 m), weight 89.4 kg, SpO2 100%.        Intake/Output Summary (Last 24 hours) at 04/07/2023 1605 Last data filed at 04/07/2023 1530 Gross per 24 hour  Intake 428 ml  Output 250 ml  Net 178 ml   Filed Weights   04/02/23 1123  Weight: 89.4 kg    Examination: General:  elderly male sitting upright in bed in mild distress HEENT: MM pink/ dry- limited assessment, extensive bruising to right side of face/ eye, slight left, hearing aids in, [place] J collar Neuro: responds easily to verbal, oriented to person/ year, not to place.  MAE- no obvious focal weakness CV: rr, ST, no murmur PULM:  tachypneic 30s, audible upper airway congestion, weak cough does not clear completely, no sputum production, diffuse rhonchi GI: soft, bs+, soft, no obvious tenderness, suprapubic cath w/ cyu Extremities: warm/dry, no LE edema  Skin: no rashes   Labs> BUN/ sCr 106/ 3.04> 112/ 3.28, Na 146, bicarb 18, K 4, iron 24, iron sat 11, TIBC 230, WBC 12.4, H/H 7.5/ 22.8> 6.6/ 20.3  BC remains ngtd UC > 100k alcaligenes faecalis  Resolved Hospital Problem list    Assessment & Plan:   Aspiration PNA, +/- HF exacerbation with bilateral pleural effusions L>R Dysphagia  P:  - high risk for decompensation, remains full code at this time although family know he would not want long term care/ has living will - no O2 requirement currently, prn for sat goal > 93%, consider HHFNC given  tachypnea if needed - ABG pending - HTS nebs, CPT as tolerated, IS/ flutter.  Mobilize as able> PT/ OT - no NTS suctioning given bleeding risk/ initial epistaxis s/p cauterization/ plavix  - SLP following, remains on dysphagia 2 diet, pending MBS.  Aspiration precautions - WBC trending down and remains afebrile, cont current ctx/ flagyl for now.  Repeat MRSA PCR neg.  Worsening CXR suspected to ongoing aspiration/ poor cough clearance  - consider diureses if BP  permits and pending renal lytes/ workup - no thoracentesis for now as bilateral, continue to monitor   Acute encephalopathy- likely multifactorial with worsening renal function, ?delirium, pending repeat CTH Mechanical fall with facial laceration C1, C5 fractures SDH P:  - CTH> left cerebral SDH now up to 7mm, previously 4 then 5, and L acute/ subacute left parafalcine SDH decreased in size compared to 3/12 CTH - c-collar; f/u w/NSGY in 4 wks - delirium precautions, neuro watch - delirium precautions - minimize sedating meds   AKI on CKD 3b, with oliguria, baseline sCr 1.8 NAGMA (likely 2/2 worsening AKI) Chronic suprapubic cath w/ UTI Hypernatremia- suspected hypovolemic/ poor PO intake - check renal lytes.  Initial CT a/p without obstruction or hydro.  If pre-renal will need IV hydration given poor PO intake.   - may need nephrology consult  - catheter exchanged 3/17, pending repeat UA - monitor strict I/Os, wts, trend renal indices, avoid nephrotoxins, hemodynamic support   ABLA Melena - suspected 2/2 to ingested blood from epistaxis  GERD - finishing 2u PRBC, recheck H/H per protocol/ trend CBC - remains on PPI BID    NSTEMI Hx CAD w/ PCI 04/2022,  HFrEF, prior EF 45-50% (11/2022) HTN HLD - cards following - BP soft overnight, SBP 90's.  Holding home antihypertensives - given Hgb drop and CTH, hold plavix for now - repeat limited echo   GOC> wife and multiple family members updated at bedside.  High risk of further decompensation.  Remains full code.  He was apparently very active and independent prior to this.  Pt does having a living will, suspect he would not want longterm support.  Best Practice (right click and "Reselect all SmartList Selections" daily)   Diet/type: dysphagia diet (see orders) DVT prophylaxis SCD Pressure ulcer(s): N/A GI prophylaxis: PPI Lines: N/A Foley:  suprapubic cath (exchanged 3/17) Code Status:  full code Last date of  multidisciplinary goals of care discussion [3/17]  Labs   CBC: Recent Labs  Lab 04/03/23 1913 04/04/23 0558 04/05/23 0815 04/06/23 0821 04/07/23 0613  WBC 19.2* 21.9* 15.6* 12.5* 12.4*  HGB 8.0* 8.3* 7.7* 7.5* 6.6*  HCT 23.6* 23.9* 22.9* 22.8* 20.3*  MCV 88.4 87.5 89.5 93.1 92.7  PLT 200 244 217 221 227    Basic Metabolic Panel: Recent Labs  Lab 04/04/23 0246 04/05/23 0815 04/06/23 0500 04/07/23 0613 04/07/23 1443  NA 141 146* 148* 144 146*  K 4.5 3.6 3.5 3.7 4.0  CL 111 117* 117* 116* 116*  CO2 15* 17* 16* 16* 18*  GLUCOSE 181* 199* 170* 182* 192*  BUN 87* 93* 96* 106* 112*  CREATININE 2.34* 2.43* 2.59* 3.04* 3.28*  CALCIUM 8.4* 8.5* 8.3* 7.7* 8.1*   GFR: Estimated Creatinine Clearance: 16.2 mL/min (A) (by C-G formula based on SCr of 3.28 mg/dL (H)). Recent Labs  Lab 04/02/23 1130 04/02/23 1910 04/04/23 0558 04/05/23 0815 04/06/23 0821 04/07/23 0613  WBC  --    < > 21.9* 15.6* 12.5* 12.4*  LATICACIDVEN 2.0*  --   --   --   --   --    < > =  values in this interval not displayed.    Liver Function Tests: Recent Labs  Lab 04/02/23 1120  AST 21  ALT 15  ALKPHOS 49  BILITOT 0.3  PROT 5.9*  ALBUMIN 3.6   No results for input(s): "LIPASE", "AMYLASE" in the last 168 hours. No results for input(s): "AMMONIA" in the last 168 hours.  ABG    Component Value Date/Time   HCO3 19.7 (L) 04/07/2023 1443   TCO2 21 (L) 04/02/2023 1130   ACIDBASEDEF 4.8 (H) 04/07/2023 1443   O2SAT 27 04/07/2023 1443     Coagulation Profile: Recent Labs  Lab 04/02/23 1120 04/03/23 0442  INR 1.2 1.3*    Cardiac Enzymes: Recent Labs  Lab 04/04/23 0246  CKTOTAL 196    HbA1C: Hgb A1c MFr Bld  Date/Time Value Ref Range Status  01/17/2021 12:20 PM 5.0 4.8 - 5.6 % Final    Comment:    (NOTE) Pre diabetes:          5.7%-6.4%  Diabetes:              >6.4%  Glycemic control for   <7.0% adults with diabetes     CBG: No results for input(s): "GLUCAP" in the last  168 hours.  Review of Systems:   unable  Past Medical History:  He,  has a past medical history of Abnormal drug screen (06/22/2014), BPH (benign prostatic hypertrophy) (01/21/1998), CHF (congestive heart failure) (HCC), Coronary artery disease, Hyperlipidemia (01/21/2002), Hypertension (05/22/2003), Left lumbar radiculopathy, and Osteoarthritis (01/22/1988).   Surgical History:   Past Surgical History:  Procedure Laterality Date   CATARACT EXTRACTION  2013   bilateral   CORONARY ANGIOGRAPHY N/A 03/29/2022   Procedure: CORONARY ANGIOGRAPHY;  Surgeon: Alwyn Pea, MD;  Location: [facility];  Service: Cardiovascular;  Laterality: N/A;   CORONARY STENT INTERVENTION N/A 03/27/2022   Procedure: CORONARY STENT INTERVENTION;  Surgeon: Alwyn Pea, MD;  Location: [facility];  Service: Cardiovascular;  Laterality: N/A;   CORONARY STENT INTERVENTION N/A 03/29/2022   Procedure: CORONARY STENT INTERVENTION;  Surgeon: Alwyn Pea, MD;  Location: [facility];  Service: Cardiovascular;  Laterality: N/A;   ESI Left 08/2014, 11/2014, 08/2015   L S1, L L5/S1 transforaminal ESI; L4/5 L5/S1 zygapophysial injections, L S1 transforaminal (Chasnis)   FINGER SURGERY     right middle, partial traumatic amputation   IR CATHETER TUBE CHANGE  03/02/2021   KNEE ARTHROSCOPY  1990   right   KNEE SURGERY  04/2006   right partial knee replacement in [place] - rec ppx abx for any invasive procedure   LEFT HEART CATH AND CORONARY ANGIOGRAPHY N/A 01/18/2021   Procedure: LEFT HEART CATH AND CORONARY ANGIOGRAPHY;  Surgeon: Lamar Blinks, MD;  Location: [facility];  Service: Cardiovascular;  Laterality: N/A;   LEFT HEART CATH AND CORONARY ANGIOGRAPHY N/A 03/27/2022   Procedure: LEFT HEART CATH AND CORONARY ANGIOGRAPHY;  Surgeon: Alwyn Pea, MD;  Location: [facility];  Service: Cardiovascular;  Laterality: N/A;   LEFT HEART CATH AND CORONARY ANGIOGRAPHY  N/A 12/11/2022   Procedure: LEFT HEART CATH AND CORONARY ANGIOGRAPHY;  Surgeon: Marcina Millard, MD;  Location: [facility];  Service: Cardiovascular;  Laterality: N/A;     Social History:   reports that he quit smoking about 22 years ago. His smoking use included pipe and cigarettes. He has been exposed to tobacco smoke. His smokeless tobacco use includes chew. He reports current alcohol use.  He reports that he does not use drugs.   Family History:  His family history includes Cancer in his daughter; Diabetes in his brother; Heart disease (age of onset: 74) in his brother; Hypertension in his mother. There is no history of Stroke.   Allergies Allergies  Allergen Reactions   Penicillins Swelling    Of tongue Has patient had a PCN reaction causing immediate rash, facial/tongue/throat swelling, SOB or lightheadedness with hypotension: Yes Has patient had a PCN reaction causing severe rash involving mucus membranes or skin necrosis: No Has patient had a PCN reaction that required hospitalization: No Has patient had a PCN reaction occurring within the last 10 years: No If all of the above answers are "NO", then may proceed with Cephalosporin use.   Cialis [Tadalafil] Other (See Comments)    Indigestion   Lipitor [Atorvastatin] Other (See Comments)    Severe Myalgias   Lisinopril Cough     Home Medications  Prior to Admission medications   Medication Sig Start Date End Date Taking? Authorizing Provider  acetaminophen (TYLENOL 8 HOUR) 650 MG CR tablet Take 1 tablet (650 mg total) by mouth 2 (two) times a day. 06/24/18  Yes Eustaquio Boyden, MD  aspirin 81 MG chewable tablet Chew 1 tablet by mouth daily.   Yes [provider]  Cholecalciferol (VITAMIN D3) 25 MCG (1000 UT) CAPS Take 1 capsule (1,000 Units total) by mouth daily. 08/25/18  Yes Eustaquio Boyden, MD  clopidogrel (PLAVIX) 75 MG tablet Take 75 mg by mouth daily. 10/10/20  Yes [provider]   cyanocobalamin 1000 MCG tablet Take 1,000 mcg by mouth daily.   Yes [provider]  docusate sodium (COLACE) 100 MG capsule Take 1 capsule (100 mg total) by mouth daily. 12/13/20  Yes Eustaquio Boyden, MD  doxazosin (CARDURA) 4 MG tablet Take 4 mg by mouth daily. 02/13/23  Yes [provider]  famotidine (PEPCID) 20 MG tablet TAKE 1 TABLET BY MOUTH AT  BEDTIME 01/23/21  Yes Eustaquio Boyden, MD  finasteride (PROSCAR) 5 MG tablet TAKE 1 TABLET BY MOUTH  DAILY 11/17/19  Yes Eustaquio Boyden, MD  furosemide (LASIX) 20 MG tablet Take 1 tablet by mouth every other day. 10/09/20  Yes [provider]  hydrALAZINE (APRESOLINE) 50 MG tablet Take 1 tablet (50 mg total) by mouth every 8 (eight) hours as needed (systolic (top number) blood pressure higher than 160 OR diastolic (bottom number) blood pressure higher than 100). 12/13/22  Yes Sunnie Nielsen, DO  isosorbide mononitrate (IMDUR) 60 MG 24 hr tablet Take 1 tablet (60 mg total) by mouth 2 (two) times daily. 03/31/22  Yes Enedina Finner, MD  loratadine (CLARITIN) 10 MG tablet Take 1 tablet (10 mg total) by mouth daily as needed for allergies. 12/22/12  Yes Eustaquio Boyden, MD  Melatonin 10 MG TABS Take 1 tablet by mouth daily as needed (for sleep).   Yes [provider]  metoprolol succinate (TOPROL-XL) 25 MG 24 hr tablet Take 1 tablet (25 mg total) by mouth 2 (two) times daily. 12/13/22  Yes Sunnie Nielsen, DO  nitroGLYCERIN (NITROSTAT) 0.4 MG SL tablet Place 1 tablet (0.4 mg total) under the tongue every 5 (five) minutes x 3 doses as needed for chest pain. 03/31/22  Yes Enedina Finner, MD  pantoprazole (PROTONIX) 40 MG tablet Take 40 mg by mouth 2 (two) times daily. 02/14/22 04/02/23 Yes [provider]  primidone (MYSOLINE) 50 MG tablet Take 2 tablets (100 mg total) by mouth 3 (three) times daily.  10/16/20  Yes Eustaquio Boyden, MD  rosuvastatin (CRESTOR) 20 MG tablet Take 20 mg by mouth at bedtime.   Yes  [provider]  sucralfate (CARAFATE) 1 GM/10ML suspension Take 1 g by mouth 2 (two) times daily.   Yes [provider]  clindamycin (CLEOCIN) 150 MG capsule SMARTSIG:4 Capsule(s) By Mouth Patient not taking: Reported on 04/02/2023 02/27/23   [provider]     Critical care time: n/a       Posey Boyer, MSN, AG-ACNP-BC [facility] 04/07/2023, 6:09 PM  See Amion for pager If no response to pager , please call [phone] until 7pm After 7:00 pm call Elink  [phone]

## 2023-04-07 NOTE — Plan of Care (Signed)
  Problem: Clinical Measurements: Goal: Will remain free from infection Outcome: Progressing   Problem: Education: Goal: Knowledge of General Education information will improve Description: Including pain rating scale, medication(s)/side effects and non-pharmacologic comfort measures Outcome: Progressing   Problem: Health Behavior/Discharge Planning: Goal: Ability to manage health-related needs will improve Outcome: Progressing   Problem: Clinical Measurements: Goal: Ability to maintain clinical measurements within normal limits will improve Outcome: Progressing Goal: Will remain free from infection Outcome: Progressing Goal: Diagnostic test results will improve Outcome: Progressing Goal: Respiratory complications will improve Outcome: Progressing Goal: Cardiovascular complication will be avoided Outcome: Progressing   

## 2023-04-07 NOTE — Progress Notes (Signed)
-   Contacted Trauma Team about CT Chest to see whether the left lower lung opacity is hemorrhagic, thinks that it is less likely.  - Contacted PCCM for Left lower Lung thoracentesis to further characterize the opacity.

## 2023-04-08 ENCOUNTER — Inpatient Hospital Stay (HOSPITAL_COMMUNITY)

## 2023-04-08 DIAGNOSIS — N179 Acute kidney failure, unspecified: Secondary | ICD-10-CM | POA: Diagnosis not present

## 2023-04-08 DIAGNOSIS — S12091A Other nondisplaced fracture of first cervical vertebra, initial encounter for closed fracture: Secondary | ICD-10-CM | POA: Diagnosis not present

## 2023-04-08 DIAGNOSIS — S12400A Unspecified displaced fracture of fifth cervical vertebra, initial encounter for closed fracture: Secondary | ICD-10-CM

## 2023-04-08 DIAGNOSIS — J90 Pleural effusion, not elsewhere classified: Secondary | ICD-10-CM | POA: Diagnosis not present

## 2023-04-08 DIAGNOSIS — S12490A Other displaced fracture of fifth cervical vertebra, initial encounter for closed fracture: Secondary | ICD-10-CM

## 2023-04-08 DIAGNOSIS — T17908A Unspecified foreign body in respiratory tract, part unspecified causing other injury, initial encounter: Secondary | ICD-10-CM | POA: Diagnosis not present

## 2023-04-08 DIAGNOSIS — I249 Acute ischemic heart disease, unspecified: Secondary | ICD-10-CM

## 2023-04-08 DIAGNOSIS — I2489 Other forms of acute ischemic heart disease: Secondary | ICD-10-CM

## 2023-04-08 DIAGNOSIS — J690 Pneumonitis due to inhalation of food and vomit: Secondary | ICD-10-CM | POA: Diagnosis not present

## 2023-04-08 DIAGNOSIS — S12001A Unspecified nondisplaced fracture of first cervical vertebra, initial encounter for closed fracture: Secondary | ICD-10-CM

## 2023-04-08 LAB — BASIC METABOLIC PANEL
Anion gap: 16 — ABNORMAL HIGH (ref 5–15)
Anion gap: 16 — ABNORMAL HIGH (ref 5–15)
BUN: 120 mg/dL — ABNORMAL HIGH (ref 8–23)
BUN: 123 mg/dL — ABNORMAL HIGH (ref 8–23)
CO2: 14 mmol/L — ABNORMAL LOW (ref 22–32)
CO2: 16 mmol/L — ABNORMAL LOW (ref 22–32)
Calcium: 8 mg/dL — ABNORMAL LOW (ref 8.9–10.3)
Calcium: 8.2 mg/dL — ABNORMAL LOW (ref 8.9–10.3)
Chloride: 118 mmol/L — ABNORMAL HIGH (ref 98–111)
Chloride: 118 mmol/L — ABNORMAL HIGH (ref 98–111)
Creatinine, Ser: 3.43 mg/dL — ABNORMAL HIGH (ref 0.61–1.24)
Creatinine, Ser: 3.49 mg/dL — ABNORMAL HIGH (ref 0.61–1.24)
GFR, Estimated: 16 mL/min — ABNORMAL LOW (ref >60)
GFR, Estimated: 16 mL/min — ABNORMAL LOW (ref >60)
Glucose, Bld: 182 mg/dL — ABNORMAL HIGH (ref 70–99)
Glucose, Bld: 183 mg/dL — ABNORMAL HIGH (ref 70–99)
Potassium: 3.9 mmol/L (ref 3.5–5.1)
Potassium: 3.6 mmol/L (ref 3.5–5.1)
Sodium: 148 mmol/L — ABNORMAL HIGH (ref 135–145)
Sodium: 150 mmol/L — ABNORMAL HIGH (ref 135–145)

## 2023-04-08 LAB — CBC
HCT: 30.1 % — ABNORMAL LOW (ref 39.0–52.0)
Hemoglobin: 9.8 g/dL — ABNORMAL LOW (ref 13.0–17.0)
MCH: 30.2 pg (ref 26.0–34.0)
MCHC: 32.6 g/dL (ref 30.0–36.0)
MCV: 92.9 fL (ref 80.0–100.0)
Platelets: 243 10*3/uL (ref 150–400)
RBC: 3.24 MIL/uL — ABNORMAL LOW (ref 4.22–5.81)
RDW: 15.6 % — ABNORMAL HIGH (ref 11.5–15.5)
WBC: 16.2 10*3/uL — ABNORMAL HIGH (ref 4.0–10.5)
nRBC: 1.3 % — ABNORMAL HIGH (ref 0.0–0.2)

## 2023-04-08 LAB — URINE CULTURE: Culture: NO GROWTH

## 2023-04-08 LAB — TYPE AND SCREEN
ABO/RH(D): A NEG
Antibody Screen: NEGATIVE
Unit division: 0
Unit division: 0

## 2023-04-08 LAB — BPAM RBC
Blood Product Expiration Date: 202503242359
Blood Product Expiration Date: 202503242359
ISSUE DATE / TIME: 202503171236
ISSUE DATE / TIME: 202503171526
Unit Type and Rh: 600
Unit Type and Rh: 600

## 2023-04-08 LAB — ECHOCARDIOGRAM LIMITED
Calc EF: 22.9 %
Est EF: <20
Height: 70 in
S' Lateral: 4.75 cm
Single Plane A2C EF: 18.4 %
Single Plane A4C EF: 23.8 %
Weight: 3152 [oz_av]

## 2023-04-08 MED ORDER — SODIUM CHLORIDE 0.9 % IV SOLN
10.0000 mg | Freq: Once | INTRAVENOUS | Status: AC
  Administered 2023-04-08: 10 mg via INTRAVENOUS
  Filled 2023-04-08: qty 1

## 2023-04-08 MED ORDER — PANTOPRAZOLE SODIUM 40 MG IV SOLR
40.0000 mg | Freq: Two times a day (BID) | INTRAVENOUS | Status: AC
  Administered 2023-04-08 (×2): 40 mg via INTRAVENOUS
  Filled 2023-04-08 (×2): qty 10

## 2023-04-08 MED ORDER — SODIUM CHLORIDE 0.9 % IV SOLN
2.0000 g | INTRAVENOUS | Status: DC
  Administered 2023-04-08: 2 g via INTRAVENOUS
  Filled 2023-04-08 (×2): qty 20

## 2023-04-08 MED ORDER — METRONIDAZOLE 500 MG/100ML IV SOLN
500.0000 mg | Freq: Two times a day (BID) | INTRAVENOUS | Status: DC
  Administered 2023-04-08 (×2): 500 mg via INTRAVENOUS
  Filled 2023-04-08 (×3): qty 100

## 2023-04-08 MED ORDER — HYDROMORPHONE HCL 1 MG/ML IJ SOLN
0.5000 mg | Freq: Four times a day (QID) | INTRAMUSCULAR | Status: DC | PRN
  Administered 2023-04-08 – 2023-04-09 (×2): 0.5 mg via INTRAVENOUS
  Filled 2023-04-08 (×2): qty 0.5

## 2023-04-08 MED ORDER — CHLORHEXIDINE GLUCONATE CLOTH 2 % EX PADS
6.0000 | MEDICATED_PAD | Freq: Every day | CUTANEOUS | Status: DC
  Administered 2023-04-08: 6 via TOPICAL

## 2023-04-08 MED ORDER — HYDROMORPHONE HCL 1 MG/ML IJ SOLN: 0.5000 mg | INTRAMUSCULAR | Status: DC | PRN

## 2023-04-08 MED ORDER — FUROSEMIDE 10 MG/ML IJ SOLN
20.0000 mg | Freq: Once | INTRAMUSCULAR | Status: AC
  Administered 2023-04-08: 20 mg via INTRAVENOUS
  Filled 2023-04-08: qty 2

## 2023-04-08 MED ORDER — CLOPIDOGREL BISULFATE 75 MG PO TABS
75.0000 mg | ORAL_TABLET | Freq: Every day | ORAL | Status: DC
  Administered 2023-04-08: 75 mg via ORAL
  Filled 2023-04-08: qty 1

## 2023-04-08 NOTE — Progress Notes (Signed)
  Echocardiogram 2D Echocardiogram has been performed.  Janalyn Harder 04/08/2023, 12:10 PM

## 2023-04-08 NOTE — Progress Notes (Signed)
 Modified Barium Swallow Study  Patient Details  Name: Shawn Andrews MRN: [id number] Date of Birth: 07-23-1928  Today's Date: 04/08/2023  Modified Barium Swallow completed.  Full report located under Chart Review in the Imaging Section.  History of Present Illness Shawn Andrews is a 89 yo male presenting to ED 3/12 after a mechanical fall down a 10 ft embankment with multiple facial abrasions as well as a scalp laceration and hematoma. Imaging reveals 4 mm para falcine SDH without midline shift and displaced fxs of L C1 and C5 in addition to stenosis at C5-6 with multilevel bony narrowing. Significant epistaxis requiring cauterization by ENT. Esophagram 12/14/22 shows mild gastroesophageal reflux but no stricture, mass, or hiatal hernia. Pt's wife reports this is managed well with medication.   PMH includes CAD s/p stent placement 2024 on ASA and plavix, CHF, BPH requiring SP tube placement, HTN, CKD, HLD   Clinical Impression The study was significantly limited by pt's mentation and level of alertness this date. Pt was unable to sustain a level of alertness to follow commands to cough or swallow. Pharyngeal edema appears more significant than during prior MBS 3/13 and is seemingly affecting pharyngeal clearance more. Pt now has a collection of residue along the base of tongue, valleculae, pharyngeal wall, and pyriform sinuses and is unable to follow commands to perform multiple swallows in an effort to clear. There appears to be retention with retrograde backflow at the level of the PES which additionally contributes to pharyngeal residue. Pt held boluses in his oral cavity for prolonged periods of time and had increased residue along his tongue and lateral sulci compared to the previous study. When he did swallow (x3 throughout the entirety of the study), nectar thick liquids were transiently penetrated in trace quantities (PAS 2). This is considered WFL and is consistent with previous MBS, but  increased pharyngeal residue increases risk of airway invasion (note increased DIGEST score, suggesting reduced efficiency, likely secondary to mentation). Suspect pt's presentation will continue to fluctuate given his mentation and acute disorientation, but at current this does not support PO intake. SLP will continue following for trials of POs as pt is more alert.   DIGEST Swallow Severity Rating*  Safety: 0  Efficiency: 3  Overall Pharyngeal Swallow Severity: 3 (severe) 1: mild; 2: moderate; 3: severe; 4: profound  *The Dynamic Imaging Grade of Swallowing Toxicity is standardized for the head and neck cancer population, however, demonstrates promising clinical applications across populations to standardize the clinical rating of pharyngeal swallow safety and severity.  Factors that may increase risk of adverse event in presence of aspiration [employer] Clearance Coots 2021): Limited mobility;Frail or deconditioned;Weak cough  Swallow Evaluation Recommendations Recommendations: NPO except meds Medication Administration: Crushed with puree Oral care recommendations: Oral care QID (4x/day);Staff/trained caregiver to provide oral care    Gwynneth Aliment, M.A., CF-SLP Speech Language Pathology, Acute Rehabilitation Services  Secure Chat preferred [phone]  04/08/2023,10:15 AM

## 2023-04-08 NOTE — Plan of Care (Signed)
  Problem: Clinical Measurements: Goal: Will remain free from infection Outcome: Not Progressing   Problem: Education: Goal: Knowledge of General Education information will improve Description: Including pain rating scale, medication(s)/side effects and non-pharmacologic comfort measures Outcome: Not Progressing   Problem: Health Behavior/Discharge Planning: Goal: Ability to manage health-related needs will improve Outcome: Not Progressing   Problem: Clinical Measurements: Goal: Ability to maintain clinical measurements within normal limits will improve Outcome: Not Progressing Goal: Will remain free from infection Outcome: Not Progressing Goal: Diagnostic test results will improve Outcome: Not Progressing Goal: Respiratory complications will improve Outcome: Not Progressing Goal: Cardiovascular complication will be avoided Outcome: Not Progressing   Problem: Activity: Goal: Risk for activity intolerance will decrease Outcome: Not Progressing   Problem: Nutrition: Goal: Adequate nutrition will be maintained Outcome: Not Progressing

## 2023-04-08 NOTE — Progress Notes (Signed)
 Shawn Andrews, MRN:  [id number], DOB:  1928-06-12, LOS: 6 ADMISSION DATE:  04/02/2023, CONSULTATION DATE:  04/07/23 REFERRING MD:  Danise Edge, CHIEF COMPLAINT:   tachypnea/WOB  History of Present Illness:   82 yoM with PMH of CAD s/p PCI (04/2022) on ASA/ plavix, HFrEF, HTN, HLD, CKDIIb, BPH, tremor, and chronic suprapubic catheter who presented after a mechanical fall, no LOC, 3/12 sustaining facial laceration, C1 and C5 fracture, SDH, and epistaxis requiring R nare cauterization w/ ENT.  NSGY following.  Admitted to IMTS. Developed melena but suspected due to ingested blood from epistaxis, Hgb dropped initially from 10.9, been holding 7-8 but 6.6 today, completing 2nd unit of PRBC.  Found to have LLL aspiration PNA, started on ctx and flagyl 3/14, since with improving WBC, afebrile.  UC > 100k alcaligenes faecalis.  Also found to have NSTEMI, felt to be type II, cardiology following.  Developed confusion 3/16, suspected to be delirium with repeat CTH pending with worsening AKI, and increasing tachypnea but and intermittent O2 requirement.  Went for CT chest 3/17 given worsening LLL opacity on CXR which showed bibasilar PNA/ atelectasis with L> R pleural effusion.  PCCM consulted for further recommendations.   Wife/ family report pt was fully independent and functional pta despite PMH, and goes to [facility] 3x /week.   Pertinent  Medical History  CAD s/p PCI (04/2022) on ASA/ plavix, HFrEF, HTN, HLD, CKDIIb, BPH, tremor, chronic suprapubic catheter, HOH and visual issues   Significant Hospital Events: Including procedures, antibiotic start and stop dates in addition to other pertinent events   3/12 admitted 3/17 PCCM consult. PCCM rec holding plavix  3/18 barium swallow   Interim History / Subjective:    Down for barium swallow   Labs w worse renal fxn -- Cr 3.43 BUN 120, serum bicarb 14  Na up to 148  WBC up to 16   Hgb 9.8 Objective   Blood pressure 106/70, pulse (!) 111, temperature 98.4  F (36.9 C), temperature source Axillary, resp. rate (!) 27, height 5\' 10"  (1.778 m), weight 89.4 kg, SpO2 97%.        Intake/Output Summary (Last 24 hours) at 04/08/2023 1055 Last data filed at 04/08/2023 1021 Gross per 24 hour  Intake 1122 ml  Output 625 ml  Net 497 ml   Filed Weights   04/02/23 1123  Weight: 89.4 kg    Examination: General:  elderly male sitting upright in bed in mild distress HEENT: C collar in place. R face/eye ecchymosis.  Neuro: opens eyes to stimuli, disoriented. Following intermittent simple commands. Garbled speech  CV: tachy, s1s2 cap refill <3 sec  PULM:  Bilateral rhonchi. Symmetrical chest expansion. Shallow respirations at incr rate  GI: soft round  Extremities: no acute deformity.  Skin: scattered ecchymosis.    Resolved Hospital Problem list    Assessment & Plan:   Mechanical fall L acute/subacute parafalcine SDH  Traumatic C1, C5 fx P -C collar -NSGY following, no operative indications   -PT/OT  Aspiration PNA Dysphagia  Small bilateral pleural effusion L>R  Epistaxis s/p cautery by ENT  P:  -Barium swallow 3/18  -cont pulm hygiene measures -- IS flutter, mobility, CPT, HTS nebs  -no NTS -NPO -rocephin flagyl  -defer thora for now, do not think these are currently causing problems. Think aspiration is the driving resp system issue   Acute encephalopathy, multifactorial - think metabolic encephalopathy is driving   -delirium, uremia, infection, SDH  P -delirium precautions -BMP w  worse renal fxn   Alcaligenes faecalis UTI Chronic suprapubic cath, exchanged 3/17 -sensitive to gent cipro imipenem tmp/smx  P -on rocephin (also on flagyl for aspiration). If ailing to improve could switch to the above  -finasteride   AKI on CKD 3b w oliguria -- FENa suggests prerenal NAGMA Hypernatremia -baseline Cr 1.8 -catheter exchanged 3/17  P -worry about a true fluid challenge w his HF / presence of effusions already  -think  maintaining perfusion MAP > 65 is probably the best support right now  ABLA Melena (likely r/t epistaxis)  GERD  P -follow CBC -BID PPI   NSTEMI Hx CAD s/p DES  HFmrEF HTN HLD  -LVEF 45% P - cards following, rec to cont plavix but was subsequently held per PCCM rec 3/17 -with improvement in H/H, would favor resuming plavix as this was cards rec re his CAD s/p DES  - repeat echo is [facility] 3/18 -don't think he BP would yet tolerate restarting his home HF meds   GOC DNR -tough spot with multisystem dysfunction, and the overlap of these problems. From pulm perspective, do not think his effusions are hugely problematic right now.  -Think aspiration is bigger worry impacting his lungs. MBS is not yet read but I anticipate rec will be NPO, and that family may be faced with decisions re temp enteral nutrition.  -declining renal fxn worrisome, think uremia is impacting his mental status which could be impacting his swallowing. -limited options right now for optimizing his HF. Hoping he can tolerate resuming plavix.  -have encouraged family to talk about both overall goals of care (QOL outomces etc) as well as thinking about if there are lines in the sand he may not wish to cross medically, should indication arise (ie if dialysis became indicated would he want this short term. Would he want invasive procedures. Pressors. Etc.)    Best Practice (right click and "Reselect all SmartList Selections" daily)   Diet/type: NPO DVT prophylaxis SCD Pressure ulcer(s): N/A GI prophylaxis: PPI and H2B Lines: N/A Foley:  suprapubic cath (exchanged 3/17) Code Status:  DNR Last date of multidisciplinary goals of care discussion [3/18  Labs   CBC: Recent Labs  Lab 04/05/23 0815 04/06/23 0821 04/07/23 0613 04/07/23 2023 04/08/23 0844  WBC 15.6* 12.5* 12.4* 13.7* 16.2*  HGB 7.7* 7.5* 6.6* 9.2* 9.8*  HCT 22.9* 22.8* 20.3* 27.4* 30.1*  MCV 89.5 93.1 92.7 91.6 92.9  PLT 217 221 227 235 243     Basic Metabolic Panel: Recent Labs  Lab 04/05/23 0815 04/06/23 0500 04/07/23 0613 04/07/23 1443 04/08/23 0844  NA 146* 148* 144 146* 148*  K 3.6 3.5 3.7 4.0 3.9  CL 117* 117* 116* 116* 118*  CO2 17* 16* 16* 18* 14*  GLUCOSE 199* 170* 182* 192* 182*  BUN 93* 96* 106* 112* 120*  CREATININE 2.43* 2.59* 3.04* 3.28* 3.43*  CALCIUM 8.5* 8.3* 7.7* 8.1* 8.0*   GFR: Estimated Creatinine Clearance: 15.5 mL/min (A) (by C-G formula based on SCr of 3.43 mg/dL (H)). Recent Labs  Lab 04/02/23 1130 04/02/23 1910 04/06/23 0821 04/07/23 0613 04/07/23 2023 04/08/23 0844  WBC  --    < > 12.5* 12.4* 13.7* 16.2*  LATICACIDVEN 2.0*  --   --   --  1.9  --    < > = values in this interval not displayed.    Liver Function Tests: Recent Labs  Lab 04/02/23 1120  AST 21  ALT 15  ALKPHOS 49  BILITOT 0.3  PROT  5.9*  ALBUMIN 3.6   No results for input(s): "LIPASE", "AMYLASE" in the last 168 hours. No results for input(s): "AMMONIA" in the last 168 hours.  ABG    Component Value Date/Time   PHART 7.45 04/07/2023 1730   PCO2ART 25 (L) 04/07/2023 1730   PO2ART 70 (L) 04/07/2023 1730   HCO3 17.7 (L) 04/07/2023 1730   TCO2 21 (L) 04/02/2023 1130   ACIDBASEDEF 4.9 (H) 04/07/2023 1730   O2SAT 97.6 04/07/2023 1730     Coagulation Profile: Recent Labs  Lab 04/02/23 1120 04/03/23 0442  INR 1.2 1.3*    Cardiac Enzymes: Recent Labs  Lab 04/04/23 0246  CKTOTAL 196    HbA1C: Hgb A1c MFr Bld  Date/Time Value Ref Range Status  01/17/2021 12:20 PM 5.0 4.8 - 5.6 % Final    Comment:    (NOTE) Pre diabetes:          5.7%-6.4%  Diabetes:              >6.4%  Glycemic control for   <7.0% adults with diabetes     CBG: No results for input(s): "GLUCAP" in the last 168 hours.  High MDM    Tessie Fass MSN, AGACNP-BC [facility] Pulmonary/Critical Care Medicine Amion for pager  04/08/2023, 10:55 AM

## 2023-04-08 NOTE — Progress Notes (Signed)
 OT Cancellation Note  Patient Details Name: Shawn Andrews MRN: [id number] DOB: 01-14-1929   Cancelled Treatment:    Reason Eval/Treat Not Completed: Patient not medically ready (per RN, pt not medically appropriate this date. Will follow up as schedul allows and pt medically ready.)  Tyler Deis, OTR/L [facility] Acute Rehabilitation Office: [phone]   Myrla Halsted 04/08/2023, 12:45 PM

## 2023-04-08 NOTE — Progress Notes (Signed)
 HD#6 SUBJECTIVE:  Patient Summary: Shawn Andrews is a 89 y.o. past medical history of CAD s/p PCI and stent LAD with DES on aspirin and plavix, HFrEF, tremor, hypertension, hyperlipidemia, CKD stage IIIb, BPH presents to ED today for mechanical fall, sustained multiple cervical fractures, subdural hematoma and facial abrasions.    Overnight Events: None  Interim History: Patient is evaluated at bedside with family present. We placed his hearing aids on, he appeared somnolent compared to yesterday, answering questions. Reports Left lower rib pain. No other concerns. Son at bedside reported that he slept well all night, but is disoriented to location, event and people. All questions were answered by family.   OBJECTIVE:  Vital Signs: Vitals:   04/07/23 1943 04/07/23 2306 04/08/23 0300 04/08/23 0849  BP: 106/76 94/63  106/70  Pulse: 99 (!) 108  (!) 111  Resp: (!) 36 (!) 35  (!) 27  Temp: 99.6 F (37.6 C) 99 F (37.2 C) 97.7 F (36.5 C) 98.4 F (36.9 C)  TempSrc: Axillary Axillary Axillary Axillary  SpO2: 99% 96%  97%  Weight:      Height:       Supplemental O2: Nasal Cannula SpO2: 97 % O2 Flow Rate (L/min): 2 L/min  Filed Weights   04/02/23 1123  Weight: 89.4 kg     Intake/Output Summary (Last 24 hours) at 04/08/2023 1142 Last data filed at 04/08/2023 1021 Gross per 24 hour  Intake 1322 ml  Output 625 ml  Net 697 ml   Net IO Since Admission: -1,839 mL [04/08/23 1142]  Physical Exam: Physical Exam General: Elderly appearing male, appears acutely ill HENT: facial abrasions and ecchymosis, R periorbital ecchymosis, eyelid shut, bluish lips. Forehead laceration with dressing. No evident epistaxis.  Cardio: RR.  Pulm: Labored shallow breathing with upper airway rattles evident, per auscultation, has diffuse rhonchi Abd: soft, NT, ND MSK: no LE edema bilaterally  Neuro: Somnolent, opens eyes to command, disoriented to location, event. Answering questions  Patient  Lines/Drains/Airways Status     Active Line/Drains/Airways     Name Placement date Placement time Site Days   Midline Single Lumen 04/04/23 Right Cephalic 8 cm 0 cm 04/04/23  1441  Cephalic  4   Suprapubic Catheter Latex 16 Fr. 04/07/23  1620  Latex  1   Wound / Incision (Open or Dehisced) 04/03/23 Skin tear Arm Lower;Posterior;Right Skin tear to right forearm 04/03/23  0820  Arm  5   Wound / Incision (Open or Dehisced) 04/03/23 Other (Comment);Skin tear Nose Anterior;Circumferential;Bilateral forehead lacerations and nose abrasions 04/03/23  0820  Nose  5             ASSESSMENT/PLAN:  Assessment: Principal Problem:   Fall Active Problems:   Essential hypertension   Benign prostatic hyperplasia   Chronic kidney disease, stage 3b (HCC)   CAD (coronary artery disease)   CKD (chronic kidney disease), stage IIIa   Suprapubic catheter (HCC)   Dyslipidemia   Subdural hematoma (HCC)   Cervical transverse process fracture (HCC)   Fracture of cervical spinous process (HCC)   Troponin level elevated   Aspiration pneumonia (HCC)   AKI (acute kidney injury) (HCC)   Plan: Mechanical fall Cervical fractures at C1 and C5 Acute/subacute subdural hematoma along the left aspect of the falx 2 mm (from 4 mm) Subdural left cerebral convexity ~ 7 mm (from 5 mm) Large R Forehead Laceration (repaired)  Forehead, R periorbital and R facial hematoma Facial Abrasions  Presented to ED after mechanical  fall without any prodromal symptoms sustaining subdural hematoma, lateral anterior scalp, forehead and periorbital hematomas, forehead laceration, acute displaced fractures of C1 and C5 and facial abrasions. EDP provider repaired forehead laceration. Repeat CTH 3/12 without any acute changes.  He appeared unstable with changes in mentation 3/17, there was a drop in Hgb, ordered CTH that showed expanding L cerebral subdural hematoma to 7 mm (5 mm). NSGY recommended no surgical intervention. Per  evaluation today, he is disoriented to location and event, appears more somnolent compared to yesterday. He is clinically not doing well unfortunately considering multiple underlying factors.    -Continue [place] J collar, follow-up outpatient for C-spine fractures in 4 weeks with neurosurgery  -Continue pain control with Tylenol 1000 mg as needed and oxycodone 5 mg every 4 hours as needed, IV Dilaudid 0.5 mg Q6H PRN -Palliative care consult today    Aspiration PNA Leukocytosis Bilateral Pleural effusions  CXR 3/17 shows worsening LLL opacity compared to CXR 3/14. Concern for worsening PNA or possibly developing parapneumonia effusion, was tachycardiac and Tachypneic. STAT CT Chest showed worsening LLL opacity and bilateral pleural effusions. PCCM consulted for possible thoracentesis. PCCM recommended medical management with Abx and pulmonary toilet. He was initially started with Linezolid, MRSA swab negative, stop it. Per exam, has labored shallow breathing with rhonchi.    - Continue IV metronidazole and Rocephin, Day 6, will continue for additional 5 days with worsening pneumonia  - CPT as tolerated - Hypertonic Nebs for 3 days  Decompensated heart failure, HFrEF  Probable apical thrombus Hypotension  Last ECHO 11/2022 showed EF 45 to 50% with regional wall motion abnormalities and normal RV function. Home medications consist of metoprolol 25 mg twice daily, Imdur 60 mg twice daily, hydralazine 50 mg 3 times daily, Lasix 20 mg every other day. S/p IV lasix 20 mg without adequate UOP. Cardiology on board, ECHO today with LVEF less than 20%, left ventricular function severely decreased, regional wall motion abnormalities, dilated LV.  Probable apical thrombus. Unfortunately, he is now having decompensated heart failure with now possible apical thrombus.Will need to have GOC with family. - Palliative and GOC    AKI on CKD stage 3B, Baseline Scr ~1.8 ATN BPH Indwelling Suprapubic catheter 2/2  urinary retention  UTI  Shawn Andrews and had it replaced on 03/12/2023. UA 3/12 +large leukocytes with Triple Phosphate crystal and grew Alcaaligenes faecalis. Received 1L NS 3/16 for hypernatremia. Suprapubic catheter exchanged 2/17 with repeat UA. We did a trial IV lasix without adequate UOP. Repeat UA 3/17 showed granular casts, FeNA 0.2%, concerning for ATN in the setting of decrease renal perfusion. Per nephro, will not be able tolerate dialysis and family against it as well.  - Consulted nephrology today, recommended   - Renal US to exclude hydronephrosis  - Hold primidone  - Rocephin 2 g, day 5 - Continue Finasteride 5 mg daily  - Avoid nephrotoxic agents   CAD s/p multiple stents  NSTEMI Type 2 NSTEMI s/p two overlapping DES stent to proximal/mid LAD (09/2020), stent to proximal RCA and stent to in-stent restenosis of mid LAD (03/2022). Previously admitted 12/09/2022 for chest pain, NSTEMI w/ elevated troponin levels. 12/11/2022 LHC with patent stents and no definite culprit lesion. Managed medically. Concern about chest tightness, chest pain, diaphoresis 3/13, EKG with out acute changes compared to previous EKG findings. Trop 170>253>421>2714. Considering demand ischemia in the setting of acute bleed. Consulted Cards, appreciate their input, continue medical management with Plavix 75 mg. Plavix was held 3/17  yesterday due to acute drop in Hgb by PCCM, restarted this AM.  - Continue Plavix 75 mg daily  - PRN Nitroglycerin SL tablet 0.4 mg   Acute blood loss anemia  Hx Anemia of chronic kidney disease  Concern for black stool Melena likely from digested oropharynx bleed from 3/12. S/p 3 PRBC. Iron 24, TIBC 230, Sat 11.  - Hgb 9.8, stable  - Trend CBC  - Transfuse goal > Hgb 7   Acute Metabolic/Uremic Encephalopathy Hospital acquired delirium  Insomnia He appears delirious this morning, not oriented to location and event. Son at bedside noted that he slept well last night,  but this morning was disoriented to persons, event, and location. He appears more somnolent today. BUN 120 (112).  - Palliative care - Seroquel at bedtime for sleep  - Delirious precautions   Dysphagia - SLP --> NPO with medications crushed    Hypernatremia S/p NS 0.45% 100 mL/hr for 10 hours 3/16. - Na 148 - Monitor BMP   Essential tremors  -HOLD medication primidone 100 mg TID    GERD -Continue home medications: IV PPI BID, IV Pepcid 20 mg at bedtime, Sulcrafate 1g BID before meals.    Diverticulosis CTAP noted diverticulosis of descending and sigmoid colon is noted without inflammation. Bowel regimen.    Hyperlipidemia Continue home medication Crestor     Resolved: Epistaxis s/p R nares cautery -Plavix contributed CT Maxillofacial without any evidence of acute maxillofacial fracture. ENT consulted 3/12 for posterior oropharynx bleed. S/p R nare cautery. Stable.  Best Practice: Diet: NPO with medication crushed  IVF: Fluids: None, Rate: None VTE: SCDs Start: 04/02/23 1630 Code: DNR/DNI AB: as above  Therapy Recs: SNF, DME: none Family Contact: bedside DISPO: Anticipated discharge is dependent on GOC to location.   Signature: Jeral Pinch, D.O.  Internal Medicine Resident, PGY-1 [facility]  Pager: [phone] 11:42 AM, 04/08/2023   Please contact the on call pager after 5 pm and on weekends at [phone].

## 2023-04-08 NOTE — Progress Notes (Signed)
 PT Cancellation Note  Patient Details Name: Shawn Andrews MRN: [id number] DOB: 03/15/1928   Cancelled Treatment:    Reason Eval/Treat Not Completed: Patient at procedure or test/unavailable (echo present; also discussed with RN who states pt not medically appropriate to participate in therapy this date).  Lillia Pauls, PT, DPT Acute Rehabilitation Services Office [phone]    Norval Morton 04/08/2023, 11:46 AM

## 2023-04-08 NOTE — Progress Notes (Addendum)
 Patient Name: Shawn Andrews Date of Encounter: 04/08/2023 [facility] Cardiologist: None   Interval Summary  .    Just back from barium swallow evaluation. Family at bedside. He remains confused.   Vital Signs .    Vitals:   04/07/23 1943 04/07/23 2306 04/08/23 0300 04/08/23 0849  BP: 106/76 94/63  106/70  Pulse: 99 (!) 108  (!) 111  Resp: (!) 36 (!) 35  (!) 27  Temp: 99.6 F (37.6 C) 99 F (37.2 C) 97.7 F (36.5 C) 98.4 F (36.9 C)  TempSrc: Axillary Axillary Axillary Axillary  SpO2: 99% 96%  97%  Weight:      Height:        Intake/Output Summary (Last 24 hours) at 04/08/2023 1009 Last data filed at 04/08/2023 0540 Gross per 24 hour  Intake 1122 ml  Output 350 ml  Net 772 ml      04/02/2023   11:23 AM 01/29/2023    8:57 AM 12/17/2022    8:44 AM  Last 3 Weights  Weight (lbs) 197 lb 182 lb 182 lb  Weight (kg) 89.359 kg 82.555 kg 82.555 kg      Telemetry/ECG    Sinus tachycardia with freq PVCs, PACs - Personally Reviewed  Physical Exam .    GEN: Ill appearing older male in c-collar. Diffuse bruising to right side of face Neck: No JVD Cardiac: RRR, no murmurs, rubs, or gallops.  Respiratory: Course rhonchi throughout GI: Soft, nontender, non-distended  MS: No edema  Assessment & Plan .     89 y.o. male with a significant history of multivessel coronary artery disease status post drug-eluting stents, heart failure with reduced ejection fraction, stage IIIb chronic kidney disease, hypertension, gastro-esophageal reflux disease, hypercholesteremia, prior deep vein thrombosis, benign prostate hypertrophy who was seen 04/04/2023 for the evaluation of chest pain elevated HS-troponin at the request of Dr. Ned Card.   Elevated troponin -- hsTn 99>>170>>421>>2714 in the setting of acute blood loss anemia requiring transfusion and subdural hematoma, therefore suspect type II MI -- was on plavix, but this stopped in the setting of drop in Hgb to 6.6 yesterday --  given stable Hgb this morning, will plan to resume   Acute on Chronic anemia -- Initially 10.9 on admission, dropped to 6.6 yesterday -- s/p 2 units PRBCs -- now improved to 9.2 today  CAD s/p prior PCI/DES- RCA 03/2022 -- last cath 11/2022, patent mLAD, patent pRCA, 80% stenosis of small caliber OM1, 70% small caliber mLCx, 75% small caliber ostium D1, 60-70% dRCA -- as above, initially on plavix but stopped in the setting of anemia. With improvement in Hgb today, as noted above will resume (discussed with PCCM at the bedside)  Chronic HFpEF -- echo 11/2022 with LVEF of 45-50%, normal RV -- s/p IV lasix 20mg  x1, net - 1.7L -- weaned to RA -- BP remains soft, therefore no room for GDMT   Per Primary Aspiration PNA AKI on CKD 3b Chronic suprapubic cath/UTI Subdural hematoma  Patient was made DNR/DNI yesterday, goals of care discussions continue.   For questions or updates, please contact [facility] Please consult [email or web address] for contact info under      Signed, Laverda Page, NP   ATTENDING ATTESTATION:  After conducting a review of all available clinical information with the care team, interviewing the patient, and performing a physical exam, I agree with the findings and plan described in this note.   GEN: Obtunded HEENT:  MMM, no JVD, no scleral  icterus, scattered facial ecchymoses; in C-collar Cardiac: RRR, no murmurs, rubs, or gallops.  Respiratory: Coarse anteriorly. GI: Soft, nontender, non-distended  MS: No edema; No deformity. Neuro:  N/A obtunded Vasc:  +2 radial pulses  Patient now obtunded with waxing/waning level of consciousness.  AKI worsening.  Hgb remains stable now, will resume plavix.  No further cardiology recs.  Agree with GOC discussion.  Alverda Skeans, MD Pager [phone]

## 2023-04-08 NOTE — Progress Notes (Signed)
 Heart Failure Navigator Progress Note  Assessed for Heart & Vascular TOC clinic readiness.  Patient does not meet criteria due toper MD note  EF <20% Poor prognosis, consulting palliative [facility] cardiology . No HF TOC   Navigator will sign off at this time.   Rhae Hammock, BSN, Scientist, clinical (histocompatibility and immunogenetics) Only

## 2023-04-08 NOTE — IPAL (Signed)
  Interdisciplinary Goals of Care Family Meeting   Date carried out: 04/08/2023  Location of the meeting: Bedside  Member's involved: Physician, Bedside Registered Nurse, Family Member or next of kin, and Other: Wife, granddaughter   Durable Power of Attorney or Environmental health practitioner: Wife, patient has living will.     Discussion: We discussed goals of care for Shawn Andrews with Wife, granddaughter and other family members present. Wife states that the patient has living will. She expresses that she does not want any interventions or invasive procedures. We talked about aggressive care vs palliative care. Family states that they do not want aggressive care involving any procedures. They want to explore options for hospice and comfort care. They would like to speak to palliative care prior to confirming comfort care.  Code status:   Code Status: Limited: Do not attempt resuscitation (DNR) -DNR-LIMITED -Do Not Intubate/DNI    Disposition: Continue current acute care  Time spent for the meeting: 50 minutes     Shawn Kastens, DO  04/08/2023, 2:44 PM

## 2023-04-08 NOTE — Consult Note (Signed)
 Reason for Consult:AKI Referring Physician: Dr. Dickie La  Chief Complaint: Fall  Assessment/Plan: Acute on Chronic Kidney Injury - Suspect multifactorial secondary to acute blood loss anemia, sepsis secondary to aspiration pneumonia, and dehydration secondary to decreased PO intake. Of note patient did also receive IV contrast. Patient is likely in ATN, unfortunately patient options for treatment are limited outside of dialysis. In this patient, given age and medical complexity it is highly unlikely patient would tolerate dialysis and would be extremely unlikely to be able to come off of dialysis. Per discussion with family, this would also not align with his care goals. Recommend primary team consult palliative care for goals of care Renal US to exclude hydronephrosis BMP q24h Hold primidone Uremia/Metabolic Encephalopathy - Mental status worsening in the setting of steadily rising BUN (120). Likely also components of delirium, UTI, and SDH As above dialysis is not treatment option Treat other causes of encephalopathy per primary team Alcaligenes faecalis UTI/Chronic Suprapubic Catheter Continue Ceftriaxone Remainder of hospital medical problems per Primary Team, Cardiology, PCCM.   HPI: Shawn Andrews is an 89 y.o. male with PMH of CAD s/p PCI (04/2022) on ASA/ plavix, HFrEF, HTN, HLD, CKDIIb, BPH, tremor, and chronic suprapubic catheter admitted after mechanical fall with resulting SDH and cervical fractures. Had subsequent acute anemia due to epistaxis requiring transfusion. Has since developed aspiration pneumonia, small bilateral pulmonary effusions, and metabolic encephalopathy is continuing to undergo treatment.   Baseline creatinine appears to be 1.8-2.0. Has risen steadily through admission to 3.43, Urine culture positive for Alcaligenes faecalis >100k. Currently on Rocephin. Has had persistently softer blood pressures through admission with MAPs ~65. Has been persistently tachycardic as  well.  Received Lasix 20mg  IV x1 this AM. Net negative 1.5L since admit. Urine output today. Urine creatinine 158, Urine Na 14. Suprapubic catheter exchanged 04/07/23.  Patient family present at bedside. Daughter is ED PA. They report patient is still confused and is worse today. Patient able to hear provider and responds with incoherent speech. Family has discussed and they note that he would not want dialysis.   ROS Pertinent items are noted in HPI.  Chemistry and CBC: Creatinine, Ser  Date/Time Value Ref Range Status  04/08/2023 08:44 AM 3.43 (H) 0.61 - 1.24 mg/dL Final  40/98/1191 47:82 PM 3.28 (H) 0.61 - 1.24 mg/dL Final  95/62/1308 65:78 AM 3.04 (H) 0.61 - 1.24 mg/dL Final  46/96/2952 84:13 AM 2.59 (H) 0.61 - 1.24 mg/dL Final  24/40/1027 25:36 AM 2.43 (H) 0.61 - 1.24 mg/dL Final  64/40/3474 25:95 AM 2.34 (H) 0.61 - 1.24 mg/dL Final  63/87/5643 32:95 AM 2.09 (H) 0.61 - 1.24 mg/dL Final  18/84/1660 63:01 AM 2.00 (H) 0.61 - 1.24 mg/dL Final  60/10/9321 55:73 AM 1.85 (H) 0.61 - 1.24 mg/dL Final  22/02/5425 06:23 AM 1.81 (H) 0.61 - 1.24 mg/dL Final  76/28/3151 76:16 AM 1.85 (H) 0.61 - 1.24 mg/dL Final  07/37/1062 69:48 AM 2.06 (H) 0.61 - 1.24 mg/dL Final  54/62/7035 00:93 PM 1.79 (H) 0.61 - 1.24 mg/dL Final  81/82/9937 16:96 AM 2.12 (H) 0.61 - 1.24 mg/dL Final  78/93/8101 75:10 PM 1.85 (H) 0.61 - 1.24 mg/dL Final  25/85/2778 24:23 AM 1.60 (H) 0.61 - 1.24 mg/dL Final  53/61/4431 54:00 AM 1.75 (H) 0.61 - 1.24 mg/dL Final  86/76/1950 93:26 AM 1.95 (H) 0.61 - 1.24 mg/dL Final  71/24/5809 98:33 AM 1.87 (H) 0.61 - 1.24 mg/dL Final  82/50/5397 67:34 AM 2.10 (H) 0.61 - 1.24 mg/dL Final  19/37/9024  12:11 PM 2.13 (H) 0.61 - 1.24 mg/dL Final  91/47/8295 62:13 PM 1.93 (H) 0.61 - 1.24 mg/dL Final  08/65/7846 96:29 AM 1.72 (H) 0.61 - 1.24 mg/dL Final  52/84/1324 40:10 AM 1.57 (H) 0.61 - 1.24 mg/dL Final  27/25/3664 40:34 AM 1.33 (H) 0.61 - 1.24 mg/dL Final  74/25/9563 87:56 PM 1.61 (H)  0.61 - 1.24 mg/dL Final  43/32/9518 84:16 AM 1.41 (H) 0.61 - 1.24 mg/dL Final  60/63/0160 10:93 AM 1.29 (H) 0.61 - 1.24 mg/dL Final  23/55/7322 02:54 AM 1.39 (H) 0.61 - 1.24 mg/dL Final  27/06/2374 28:31 AM 1.41 (H) 0.61 - 1.24 mg/dL Final  51/76/1607 37:10 AM 1.53 (H) 0.61 - 1.24 mg/dL Final  62/69/4854 62:70 AM 1.49 (H) 0.61 - 1.24 mg/dL Final  35/00/9381 82:99 AM 1.37 (H) 0.61 - 1.24 mg/dL Final  37/16/9678 93:81 PM 1.29 (H) 0.61 - 1.24 mg/dL Final  01/75/1025 85:27 AM 1.37 (H) 0.61 - 1.24 mg/dL Final  78/24/2353 61:44 AM 1.36 (H) 0.61 - 1.24 mg/dL Final  31/54/0086 76:19 PM 1.24 0.61 - 1.24 mg/dL Final  50/93/2671 24:58 AM 1.38 0.40 - 1.50 mg/dL Final  09/98/3382 50:53 PM 1.51 (H) 0.40 - 1.50 mg/dL Final  97/67/3419 37:90 AM 1.65 (H) 0.40 - 1.50 mg/dL Final  24/09/7351 29:92 AM 1.41 0.40 - 1.50 mg/dL Final  42/68/3419 62:22 AM 1.21 0.40 - 1.50 mg/dL Final  97/98/9211 94:17 AM 1.28 0.40 - 1.50 mg/dL Final  40/81/4481 85:63 PM 1.17 0.61 - 1.24 mg/dL Final  14/97/0263 78:58 AM 1.28 0.40 - 1.50 mg/dL Final  85/02/7739 28:78 AM 1.29 0.40 - 1.50 mg/dL Final  67/67/2094 70:96 AM 1.23 0.61 - 1.24 mg/dL Final  28/36/6294 76:54 AM 1.25 0.40 - 1.50 mg/dL Final  65/03/5463 68:12 AM 1.52 (H) 0.40 - 1.50 mg/dL Final  75/17/0017 49:44 AM 1.51 (H) 0.40 - 1.50 mg/dL Final  96/75/9163 84:66 AM 1.55 (H) 0.40 - 1.50 mg/dL Final  59/93/5701 77:93 AM 1.37 0.40 - 1.50 mg/dL Final   Recent Labs  Lab 04/03/23 0442 04/04/23 0246 04/05/23 0815 04/06/23 0500 04/07/23 0613 04/07/23 1443 04/08/23 0844  NA 140 141 146* 148* 144 146* 148*  K 5.3* 4.5 3.6 3.5 3.7 4.0 3.9  CL 107 111 117* 117* 116* 116* 118*  CO2 21* 15* 17* 16* 16* 18* 14*  GLUCOSE 174* 181* 199* 170* 182* 192* 182*  BUN 59* 87* 93* 96* 106* 112* 120*  CREATININE 2.09* 2.34* 2.43* 2.59* 3.04* 3.28* 3.43*  CALCIUM 7.7* 8.4* 8.5* 8.3* 7.7* 8.1* 8.0*   Recent Labs  Lab 04/06/23 0821 04/07/23 0613 04/07/23 2023 04/08/23 0844  WBC  12.5* 12.4* 13.7* 16.2*  HGB 7.5* 6.6* 9.2* 9.8*  HCT 22.8* 20.3* 27.4* 30.1*  MCV 93.1 92.7 91.6 92.9  PLT 221 227 235 243   Liver Function Tests: Recent Labs  Lab 04/02/23 1120  AST 21  ALT 15  ALKPHOS 49  BILITOT 0.3  PROT 5.9*  ALBUMIN 3.6   No results for input(s): "LIPASE", "AMYLASE" in the last 168 hours. No results for input(s): "AMMONIA" in the last 168 hours. Cardiac Enzymes: Recent Labs  Lab 04/04/23 0246  CKTOTAL 196   Iron Studies:  Recent Labs    04/07/23 0613  IRON 24*  TIBC 230*  FERRITIN 82   PT/INR: @LABRCNTIP (inr:5)  Xrays/Other Studies: ) Results for orders placed or performed during the hospital encounter of 04/02/23 (from the past 48 hours)  Iron and TIBC     Status: Abnormal   Collection Time:  04/07/23  6:13 AM  Result Value Ref Range   Iron 24 (L) 45 - 182 ug/dL   TIBC 161 (L) 096 - 045 ug/dL   Saturation Ratios 11 (L) 17.9 - 39.5 %   UIBC 206 ug/dL    Comment: Performed at [facility], [address] [place], [place] [address]  Ferritin     Status: None   Collection Time: 04/07/23  6:13 AM  Result Value Ref Range   Ferritin 82 24 - 336 ng/mL    Comment: Performed at [facility], [address], [place], [place] [address]  CBC     Status: Abnormal   Collection Time: 04/07/23  6:13 AM  Result Value Ref Range   WBC 12.4 (H) 4.0 - 10.5 K/uL   RBC 2.19 (L) 4.22 - 5.81 MIL/uL   Hemoglobin 6.6 (LL) 13.0 - 17.0 g/dL    Comment: REPEATED TO VERIFY THIS CRITICAL RESULT HAS VERIFIED AND BEEN CALLED TO RN NATE WOODY BY JOSEPHINE BARRON ON 03 17 2025 AT 0751, AND HAS BEEN READ BACK.     HCT 20.3 (L) 39.0 - 52.0 %   MCV 92.7 80.0 - 100.0 fL   MCH 30.1 26.0 - 34.0 pg   MCHC 32.5 30.0 - 36.0 g/dL   RDW 82.9 56.2 - 13.0 %   Platelets 227 150 - 400 K/uL   nRBC 0.6 (H) 0.0 - 0.2 %    Comment: Performed at [facility], [address], [place], [place] [address]  Basic metabolic panel     Status: Abnormal   Collection  Time: 04/07/23  6:13 AM  Result Value Ref Range   Sodium 144 135 - 145 mmol/L   Potassium 3.7 3.5 - 5.1 mmol/L   Chloride 116 (H) 98 - 111 mmol/L   CO2 16 (L) 22 - 32 mmol/L   Glucose, Bld 182 (H) 70 - 99 mg/dL    Comment: Glucose reference range applies only to samples taken after fasting for at least 8 hours.   BUN 106 (H) 8 - 23 mg/dL   Creatinine, Ser 4.69 (H) 0.61 - 1.24 mg/dL   Calcium 7.7 (L) 8.9 - 10.3 mg/dL   GFR, Estimated 19 (L) >60 mL/min    Comment: (NOTE) Calculated using the CKD-EPI Creatinine Equation (2021)    Anion gap 12 5 - 15    Comment: Performed at [facility], [address] [place], [place] [address]  Prepare RBC (crossmatch)     Status: None   Collection Time: 04/07/23  9:30 AM  Result Value Ref Range   Order Confirmation      ORDER PROCESSED BY BLOOD BANK Performed at [facility], [address] [place], [place] [address]   Type and screen [facility]     Status: None   Collection Time: 04/07/23  9:30 AM  Result Value Ref Range   ABO/RH(D) A NEG    Antibody Screen NEG    Sample Expiration 04/10/2023,2359    Unit Number [id number]    Blood Component Type RBC LR PHER1    Unit division 00    Status of Unit ISSUED,FINAL    Transfusion Status OK TO TRANSFUSE    Crossmatch Result      Compatible Performed at [facility], [address], [place], [place] [address]    Unit Number [id number]    Blood Component Type RED CELLS,LR    Unit division 00  Status of Unit ISSUED,FINAL    Transfusion Status OK TO TRANSFUSE    Crossmatch Result Compatible   Basic metabolic panel     Status: Abnormal   Collection Time: 04/07/23  2:43 PM  Result Value Ref Range   Sodium 146 (H) 135 - 145 mmol/L   Potassium 4.0 3.5 - 5.1 mmol/L   Chloride 116 (H) 98 - 111 mmol/L   CO2 18 (L) 22 - 32 mmol/L   Glucose, Bld 192 (H) 70 - 99 mg/dL    Comment: Glucose reference range applies only to samples taken after fasting for at  least 8 hours.   BUN 112 (H) 8 - 23 mg/dL   Creatinine, Ser 1.61 (H) 0.61 - 1.24 mg/dL   Calcium 8.1 (L) 8.9 - 10.3 mg/dL   GFR, Estimated 17 (L) >60 mL/min    Comment: (NOTE) Calculated using the CKD-EPI Creatinine Equation (2021)    Anion gap 12 5 - 15    Comment: Performed at [facility], [address], [place], [place] [address]  Blood gas, venous     Status: Abnormal   Collection Time: 04/07/23  2:43 PM  Result Value Ref Range   pH, Ven 7.36 7.25 - 7.43   pCO2, Ven 35 (L) 44 - 60 mmHg   pO2, Ven <31 (LL) 32 - 45 mmHg    Comment: CRITICAL RESULT CALLED TO, READ BACK BY AND VERIFIED WITH:  A. Hane RN , @1502 , 04/07/23, [place],T.     Bicarbonate 19.7 (L) 20.0 - 28.0 mmol/L   Acid-base deficit 4.8 (H) 0.0 - 2.0 mmol/L   O2 Saturation 27 %   Patient temperature 37.5    Collection site LEFT AC    Drawn by [id number]     Comment: Performed at [facility], [address], [place], [place] [address]  MRSA Next Gen by PCR, Nasal     Status: None   Collection Time: 04/07/23  3:18 PM   Specimen: Nasal Mucosa; Nasal Swab  Result Value Ref Range   MRSA by PCR Next Gen NOT DETECTED NOT DETECTED    Comment: (NOTE) The GeneXpert MRSA Assay (FDA approved for NASAL specimens only), is one component of a comprehensive MRSA colonization surveillance program. It is not intended to diagnose MRSA infection nor to guide or monitor treatment for MRSA infections. Test performance is not FDA approved in patients less than 73 years old. Performed at [facility], [address] [place], [place] [address]   Urinalysis, Routine w reflex microscopic -Urine, Catheterized     Status: Abnormal   Collection Time: 04/07/23  4:53 PM  Result Value Ref Range   Color, Urine YELLOW YELLOW   APPearance HAZY (A) CLEAR   Specific Gravity, Urine 1.020 1.005 - 1.030   pH 5.0 5.0 - 8.0   Glucose, UA NEGATIVE NEGATIVE mg/dL   Hgb urine dipstick MODERATE (A) NEGATIVE   Bilirubin Urine NEGATIVE  NEGATIVE   Ketones, ur NEGATIVE NEGATIVE mg/dL   Protein, ur 30 (A) NEGATIVE mg/dL   Nitrite NEGATIVE NEGATIVE   Leukocytes,Ua SMALL (A) NEGATIVE   RBC / HPF 6-10 0 - 5 RBC/hpf   WBC, UA 6-10 0 - 5 WBC/hpf   Bacteria, UA RARE (A) NONE SEEN   Squamous Epithelial / HPF 0-5 0 - 5 /HPF   WBC Clumps PRESENT    Mucus PRESENT    Hyaline Casts, UA PRESENT    Granular Casts, UA PRESENT     Comment:  Performed at [facility] Lab, [address], [place], [place] [address]  Sodium, urine, random     Status: None   Collection Time: 04/07/23  4:53 PM  Result Value Ref Range   Sodium, Ur 14 mmol/L    Comment: Performed at [facility], [address] [place], [place] [address]  Creatinine, urine, random     Status: None   Collection Time: 04/07/23  4:53 PM  Result Value Ref Range   Creatinine, Urine 158 mg/dL    Comment: Performed at [facility], [address] [place], [place] [address]  Blood gas, arterial     Status: Abnormal   Collection Time: 04/07/23  5:30 PM  Result Value Ref Range   pH, Arterial 7.45 7.35 - 7.45   pCO2 arterial 25 (L) 32 - 48 mmHg   pO2, Arterial 70 (L) 83 - 108 mmHg   Bicarbonate 17.7 (L) 20.0 - 28.0 mmol/L   Acid-base deficit 4.9 (H) 0.0 - 2.0 mmol/L   O2 Saturation 97.6 %   Patient temperature 36.1    Collection site LEFT RADIAL    Drawn by [id number]    Allens test (pass/fail) PASS PASS    Comment: Performed at [facility], [address], [place], [place] [address]  CBC     Status: Abnormal   Collection Time: 04/07/23  8:23 PM  Result Value Ref Range   WBC 13.7 (H) 4.0 - 10.5 K/uL   RBC 2.99 (L) 4.22 - 5.81 MIL/uL   Hemoglobin 9.2 (L) 13.0 - 17.0 g/dL    Comment: REPEATED TO VERIFY POST TRANSFUSION SPECIMEN    HCT 27.4 (L) 39.0 - 52.0 %   MCV 91.6 80.0 - 100.0 fL   MCH 30.8 26.0 - 34.0 pg   MCHC 33.6 30.0 - 36.0 g/dL   RDW 21.3 08.6 - 57.8 %   Platelets 235 150 - 400 K/uL   nRBC 1.0 (H) 0.0 - 0.2 %    Comment: Performed at [facility], [address], [place], [place] [address]  Lactic acid, plasma     Status: None   Collection Time: 04/07/23  8:23 PM  Result Value Ref Range   Lactic Acid, Venous 1.9 0.5 - 1.9 mmol/L    Comment: Performed at [facility], [address], [place], [place] [address]  CBC     Status: Abnormal   Collection Time: 04/08/23  8:44 AM  Result Value Ref Range   WBC 16.2 (H) 4.0 - 10.5 K/uL   RBC 3.24 (L) 4.22 - 5.81 MIL/uL   Hemoglobin 9.8 (L) 13.0 - 17.0 g/dL   HCT 13.2 (L) 44.0 - 10.2 %   MCV 92.9 80.0 - 100.0 fL   MCH 30.2 26.0 - 34.0 pg   MCHC 32.6 30.0 - 36.0 g/dL   RDW 72.5 (H) 36.6 - 44.0 %   Platelets 243 150 - 400 K/uL   nRBC 1.3 (H) 0.0 - 0.2 %    Comment: Performed at [facility], [address] [place], [place] [address]  Basic metabolic panel     Status: Abnormal   Collection Time: 04/08/23  8:44 AM  Result Value Ref Range   Sodium 148 (H) 135 - 145 mmol/L   Potassium 3.9 3.5 - 5.1 mmol/L   Chloride 118 (H) 98 - 111 mmol/L   CO2 14 (L) 22 - 32 mmol/L   Glucose, Bld 182 (H) 70 - 99  mg/dL    Comment: Glucose reference range applies only to samples taken after fasting for at least 8 hours.   BUN 120 (H) 8 - 23 mg/dL   Creatinine, Ser 4.09 (H) 0.61 - 1.24 mg/dL   Calcium 8.0 (L) 8.9 - 10.3 mg/dL   GFR, Estimated 16 (L) >60 mL/min    Comment: (NOTE) Calculated using the CKD-EPI Creatinine Equation (2021)    Anion gap 16 (H) 5 - 15    Comment: Performed at [facility] Lab, [address] [place], [place] [address]   DG Swallowing Func-Speech Pathology Result Date: 04/08/2023 Table formatting from the original result was not included. Modified Barium Swallow Study Patient Details Name: Shawn Andrews MRN: [id number] Date of Birth: [birth year] Today's Date: 04/08/2023 HPI/PMH: HPI: Shawn Andrews is a 89 yo male presenting to ED 3/12 after a mechanical fall down a 10 ft embankment with multiple facial abrasions as well as a scalp laceration and hematoma.  Imaging reveals 4 mm para falcine SDH without midline shift and displaced fxs of L C1 and C5 in addition to stenosis at C5-6 with multilevel bony narrowing. Significant epistaxis requiring cauterization by ENT. Esophagram 12/14/22 shows mild gastroesophageal reflux but no stricture, mass, or hiatal hernia. Pt's wife reports this is managed well with medication.   PMH includes CAD s/p stent placement 2024 on ASA and plavix, CHF, BPH requiring SP tube placement, HTN, CKD, HLD Clinical Impression: The study was significantly limited by pt's mentation and level of alertness this date. Pt was unable to sustain a level of alertness to follow commands to cough or swallow. Pharyngeal edema appears more significant than during prior MBS 3/13 and is seemingly affecting pharyngeal clearance more. Pt now has a collection of residue along the base of tongue, valleculae, pharyngeal wall, and pyriform sinuses and is unable to follow commands to perform multiple swallows in an effort to clear. There appears to be retention with retrograde backflow at the level of the PES which additionally contributes to pharyngeal residue. Pt held boluses in his oral cavity for prolonged periods of time and had increased residue along his tongue and lateral sulci compared to the previous study. When he did swallow (x3 throughout the entirety of the study), nectar thick liquids were transiently penetrated in trace quantities (PAS 2). This is considered WFL and is consistent with previous MBS, but increased pharyngeal residue increases risk of airway invasion (note increased DIGEST score, suggesting reduced efficiency, likely secondary to mentation). Suspect pt's presentation will continue to fluctuate given his mentation and acute disorientation, but at current this does not support PO intake. SLP will continue following for trials of POs as pt is more alert.  DIGEST Swallow Severity Rating*             Safety: 0             Efficiency: 3              Overall Pharyngeal Swallow Severity: 3 (severe) 1: mild; 2: moderate; 3: severe; 4: profound *The Dynamic Imaging Grade of Swallowing Toxicity is standardized for the head and neck cancer population, however, demonstrates promising clinical applications across populations to standardize the clinical rating of pharyngeal swallow safety and severity. Factors that may increase risk of adverse event in presence of aspiration Rubye Oaks & Clearance Coots 2021): Factors that may increase risk of adverse event in presence of aspiration Rubye Oaks & Clearance Coots 2021): Limited mobility; Frail or deconditioned; Weak cough Recommendations/Plan: Swallowing Evaluation Recommendations Swallowing  Evaluation Recommendations Recommendations: NPO except meds Medication Administration: Crushed with puree Oral care recommendations: Oral care QID (4x/day); Staff/trained caregiver to provide oral care Treatment Plan Treatment Plan Treatment recommendations: Therapy as outlined in treatment plan below Follow-up recommendations: Skilled nursing-short term rehab (<3 hours/day) Functional status assessment: Patient has had a recent decline in their functional status and demonstrates the ability to make significant improvements in function in a reasonable and predictable amount of time. Treatment frequency: Min 2x/week Treatment duration: 2 weeks Interventions: Aspiration precaution training; Compensatory techniques; Patient/family education; Trials of upgraded texture/liquids; Diet toleration management by SLP Recommendations Recommendations for follow up therapy are one component of a multi-disciplinary discharge planning process, led by the attending physician.  Recommendations may be updated based on patient status, additional functional criteria and insurance authorization. Assessment: Orofacial Exam: Orofacial Exam Oral Cavity: Oral Hygiene: WFL Oral Cavity - Dentition: Dentures, top; Missing dentition Orofacial Anatomy: WFL Oral Motor/Sensory  Function: WFL Anatomy: Anatomy: Suspected cervical osteophytes; Other (Comment) (spondylosis C1-5 on CT cervical spine) Boluses Administered: Boluses Administered Boluses Administered: Mildly thick liquids (Level 2, nectar thick); Puree  Oral Impairment Domain: Oral Impairment Domain Lip Closure: Escape beyond mid-chin Tongue control during bolus hold: Not tested Bolus transport/lingual motion: Delayed initiation of tongue motion (oral holding) Oral residue: Residue collection on oral structures Location of oral residue : Floor of mouth; Tongue; Palate Initiation of pharyngeal swallow : Pyriform sinuses  Pharyngeal Impairment Domain: Pharyngeal Impairment Domain Soft palate elevation: No bolus between soft palate (SP)/pharyngeal wall (PW) Laryngeal elevation: Complete superior movement of thyroid cartilage with complete approximation of arytenoids to epiglottic petiole Anterior hyoid excursion: Partial anterior movement Epiglottic movement: Partial inversion Laryngeal vestibule closure: Complete, no air/contrast in laryngeal vestibule Pharyngeal stripping wave : Present - diminished Pharyngeal contraction (A/P view only): N/A Pharyngoesophageal segment opening: Minimal distention/minimal duration, marked obstruction of flow Tongue base retraction: Wide column of contrast or air between tongue base and PPW Pharyngeal residue: Majority of contrast within or on pharyngeal structures Location of pharyngeal residue: Tongue base; Valleculae; Pharyngeal wall; Pyriform sinuses  Esophageal Impairment Domain: No data recorded Pill: No data recorded Penetration/Aspiration Scale Score: Penetration/Aspiration Scale Score 1.  Material does not enter airway: Puree 2.  Material enters airway, remains ABOVE vocal cords then ejected out: Mildly thick liquids (Level 2, nectar thick) Compensatory Strategies: Compensatory Strategies Compensatory strategies: No   General Information: Caregiver present: No  Diet Prior to this Study:  Dysphagia 2 (finely chopped); Mildly thick liquids (Level 2, nectar thick)   Temperature : Normal   Respiratory Status: WFL   Supplemental O2: None (Room air)   History of Recent Intubation: No  Behavior/Cognition: Alert; Cooperative; Requires cueing; Confused Self-Feeding Abilities: Needs assist with self-feeding Baseline vocal quality/speech: Normal Volitional Cough: Unable to elicit Volitional Swallow: Unable to elicit Exam Limitations: Fatigue Goal Planning: Prognosis for improved oropharyngeal function: Good Barriers to Reach Goals: Severity of deficits No data recorded Patient/Family Stated Goal: none stated Consulted and agree with results and recommendations: Pt unable/family or caregiver not available Pain: Pain Assessment Pain Assessment: Faces Faces Pain Scale: 0 Pain Intervention(s): Monitored during session End of Session: Start Time:SLP Start Time (ACUTE ONLY): 0934 Stop Time: SLP Stop Time (ACUTE ONLY): 4010 Time Calculation:SLP Time Calculation (min) (ACUTE ONLY): 18 min Charges: SLP Evaluations $ SLP Speech Visit: 1 Visit SLP Evaluations $MBS Swallow: 1 Procedure $Swallowing Treatment: 1 Procedure $Speech Treatment for Individual: 1 Procedure SLP visit diagnosis: SLP Visit Diagnosis: Dysphagia, oropharyngeal phase (R13.12) Past Medical History: Past  Medical History: Diagnosis Date  Abnormal drug screen 06/22/2014  inapprop neg xanax rpt 3 mo (06/2014)  BPH (benign prostatic hypertrophy) 01/21/1998  has had 3 biopsies in past ([facility]) decided to stop PSA/DRE  CHF (congestive heart failure) (HCC)   Coronary artery disease   Hyperlipidemia 01/21/2002  Hypertension 05/22/2003  Left lumbar radiculopathy   Osteoarthritis 01/22/1988  knees, lumbar spondylosis and listhesis Past Surgical History: Past Surgical History: Procedure Laterality Date  CATARACT EXTRACTION  2013  bilateral  CORONARY ANGIOGRAPHY N/A 03/29/2022  Procedure: CORONARY ANGIOGRAPHY;  Surgeon: Alwyn Pea, MD;  Location: [facility]  INVASIVE CV LAB;  Service: Cardiovascular;  Laterality: N/A;  CORONARY STENT INTERVENTION N/A 03/27/2022  Procedure: CORONARY STENT INTERVENTION;  Surgeon: Alwyn Pea, MD;  Location: [facility] INVASIVE CV LAB;  Service: Cardiovascular;  Laterality: N/A;  CORONARY STENT INTERVENTION N/A 03/29/2022  Procedure: CORONARY STENT INTERVENTION;  Surgeon: Alwyn Pea, MD;  Location: [facility] INVASIVE CV LAB;  Service: Cardiovascular;  Laterality: N/A;  ESI Left 08/2014, 11/2014, 08/2015  L S1, L L5/S1 transforaminal ESI; L4/5 L5/S1 zygapophysial injections, L S1 transforaminal (Chasnis)  FINGER SURGERY    right middle, partial traumatic amputation  IR CATHETER TUBE CHANGE  03/02/2021  KNEE ARTHROSCOPY  1990  right  KNEE SURGERY  04/2006  right partial knee replacement in [place] - rec ppx abx for any invasive procedure  LEFT HEART CATH AND CORONARY ANGIOGRAPHY N/A 01/18/2021  Procedure: LEFT HEART CATH AND CORONARY ANGIOGRAPHY;  Surgeon: Lamar Blinks, MD;  Location: [facility];  Service: Cardiovascular;  Laterality: N/A;  LEFT HEART CATH AND CORONARY ANGIOGRAPHY N/A 03/27/2022  Procedure: LEFT HEART CATH AND CORONARY ANGIOGRAPHY;  Surgeon: Alwyn Pea, MD;  Location: [facility];  Service: Cardiovascular;  Laterality: N/A;  LEFT HEART CATH AND CORONARY ANGIOGRAPHY N/A 12/11/2022  Procedure: LEFT HEART CATH AND CORONARY ANGIOGRAPHY;  Surgeon: Marcina Millard, MD;  Location: [facility];  Service: Cardiovascular;  Laterality: N/A; Gwynneth Aliment, M.A., CF-SLP Speech Language Pathology, Acute Rehabilitation Services Secure Chat preferred [phone] 04/08/2023, 10:26 AM  CT CHEST WO CONTRAST Result Date: 04/07/2023 CLINICAL DATA:  Dyspnea, chronic, chest wall or pleura disease suspected Worsening LLL opacity, concern for parapneumonic effusion EXAM: CT CHEST WITHOUT CONTRAST TECHNIQUE: Multidetector CT imaging of the chest was performed following the standard protocol without IV  contrast. RADIATION DOSE REDUCTION: This exam was performed according to the departmental dose-optimization program which includes automated exposure control, adjustment of the mA and/or kV according to patient size and/or use of iterative reconstruction technique. COMPARISON:  Same-day x-ray, CT 04/02/2023 FINDINGS: Cardiovascular: Cardiomegaly. No pericardial effusion. Thoracic aorta is nonaneurysmal. Aortic and coronary artery atherosclerosis. Central pulmonary vasculature is dilated. Relative hypoattenuation of the cardiac blood pool indicative of anemia. Mediastinum/Nodes: No enlarged mediastinal or axillary lymph nodes. Thyroid gland, trachea, and esophagus demonstrate no significant findings. Lungs/Pleura: Small-moderate left pleural effusion, increased. New small right pleural effusion. Dependent bibasilar airspace opacities with surrounding ground-glass attenuation, new from prior. No pneumothorax. Upper Abdomen: No acute abnormality. Musculoskeletal: No new or acute bony or chest wall abnormality. IMPRESSION: 1. Small-moderate left pleural effusion, increased. New small right pleural effusion. 2. Dependent bibasilar airspace opacities with surrounding ground-glass attenuation, new from prior. Appearance favors a combination of atelectasis and pneumonia. 3. Cardiomegaly. Dilated central pulmonary vasculature, suggestive of pulmonary arterial hypertension. 4. Aortic and coronary artery atherosclerosis (ICD10-I70.0). Electronically Signed   By: Duanne Guess D.O.   On: 04/07/2023 16:30   CT HEAD WO CONTRAST (  ) Result Date: 04/07/2023 CLINICAL DATA:  Provided history: Subdural bleed. Concern for worsening. EXAM: CT HEAD WITHOUT CONTRAST TECHNIQUE: Contiguous axial images were obtained from the base of the skull through the vertex without intravenous contrast. RADIATION DOSE REDUCTION: This exam was performed according to the departmental dose-optimization program which includes automated exposure  control, adjustment of the mA and/or kV according to patient size and/or use of iterative reconstruction technique. COMPARISON:  Prior head CT examinations 04/02/2023 and earlier. FINDINGS: Brain: Generalized cerebral atrophy. The acute/subacute subdural hematoma along the left aspect of the falx has decreased in size, now measuring 2 mm in thickness (previously 4 mm). Intermediate to low-density subdural collection along the left cerebral convexity measuring up to 7 mm in thickness. In retrospect, this finding was present on the most recent prior head CT and has slightly increased in size since that time (previously measuring up to 5 mm in thickness). This may reflect a subdural hematoma or hematohygroma. No demarcated cortical infarct. No evidence of an intracranial mass. No midline shift. Vascular: No hyperdense vessel. Atherosclerotic calcifications. Skull: No calvarial fracture or aggressive osseous lesion. Sinuses/Orbits: Forehead, right periorbital and right facial hematoma. No acute finding within the orbits. Moderate mucosal thickening within the bilateral maxillary sinuses. Impression #1 will be called to the ordering clinician or representative by the Radiologist Assistant, and communication documented in the PACS or [employer]. IMPRESSION: 1. An intermediate to low-density subdural collection along the left cerebral convexity has slightly increased in size, now measuring up to 7 mm in thickness (previously 5 mm). This may reflect a subdural hematoma or hematohygroma. No midline shift. 2. The acute/subacute left parafalcine subdural hematoma has decreased in size, now measuring 2 mm in thickness. 3. Cerebral atrophy. 4. Forehead laceration. Forehead, right periorbital and right facial hematoma. 5. Moderate mucosal thickening within the bilateral maxillary sinuses. Electronically Signed   By: Jackey Loge D.O.   On: 04/07/2023 16:29   DG CHEST PORT 1 VIEW Result Date: 04/07/2023 CLINICAL DATA:   10026 Shortness of breath 786.05.ICD-9-CM EXAM: PORTABLE CHEST 1 VIEW COMPARISON:  04/04/2023 FINDINGS: Stable cardiomegaly. Aortic atherosclerosis. Progressive left basilar airspace opacity. Possible trace left pleural effusion. No pneumothorax. IMPRESSION: Progressive left basilar airspace opacity, which may represent atelectasis or pneumonia. Electronically Signed   By: Duanne Guess D.O.   On: 04/07/2023 12:48    PMH:   Past Medical History:  Diagnosis Date   Abnormal drug screen 06/22/2014   inapprop neg xanax rpt 3 mo (06/2014)   BPH (benign prostatic hypertrophy) 01/21/1998   has had 3 biopsies in past ([facility]) decided to stop PSA/DRE   CHF (congestive heart failure) (HCC)    Coronary artery disease    Hyperlipidemia 01/21/2002   Hypertension 05/22/2003   Left lumbar radiculopathy    Osteoarthritis 01/22/1988   knees, lumbar spondylosis and listhesis    PSH:   Past Surgical History:  Procedure Laterality Date   CATARACT EXTRACTION  2013   bilateral   CORONARY ANGIOGRAPHY N/A 03/29/2022   Procedure: CORONARY ANGIOGRAPHY;  Surgeon: Alwyn Pea, MD;  Location: [facility];  Service: Cardiovascular;  Laterality: N/A;   CORONARY STENT INTERVENTION N/A 03/27/2022   Procedure: CORONARY STENT INTERVENTION;  Surgeon: Alwyn Pea, MD;  Location: [facility];  Service: Cardiovascular;  Laterality: N/A;   CORONARY STENT INTERVENTION N/A 03/29/2022   Procedure: CORONARY STENT INTERVENTION;  Surgeon: Alwyn Pea, MD;  Location: [facility];  Service: Cardiovascular;  Laterality: N/A;  ESI Left 08/2014, 11/2014, 08/2015   L S1, L L5/S1 transforaminal ESI; L4/5 L5/S1 zygapophysial injections, L S1 transforaminal (Chasnis)   FINGER SURGERY     right middle, partial traumatic amputation   IR CATHETER TUBE CHANGE  03/02/2021   KNEE ARTHROSCOPY  1990   right   KNEE SURGERY  04/2006   right partial knee replacement in [place] - rec ppx abx for any  invasive procedure   LEFT HEART CATH AND CORONARY ANGIOGRAPHY N/A 01/18/2021   Procedure: LEFT HEART CATH AND CORONARY ANGIOGRAPHY;  Surgeon: Lamar Blinks, MD;  Location: [facility];  Service: Cardiovascular;  Laterality: N/A;   LEFT HEART CATH AND CORONARY ANGIOGRAPHY N/A 03/27/2022   Procedure: LEFT HEART CATH AND CORONARY ANGIOGRAPHY;  Surgeon: Alwyn Pea, MD;  Location: [facility];  Service: Cardiovascular;  Laterality: N/A;   LEFT HEART CATH AND CORONARY ANGIOGRAPHY N/A 12/11/2022   Procedure: LEFT HEART CATH AND CORONARY ANGIOGRAPHY;  Surgeon: Marcina Millard, MD;  Location: [facility];  Service: Cardiovascular;  Laterality: N/A;    Allergies:  Allergies  Allergen Reactions   Penicillins Swelling    Of tongue Has patient had a PCN reaction causing immediate rash, facial/tongue/throat swelling, SOB or lightheadedness with hypotension: Yes Has patient had a PCN reaction causing severe rash involving mucus membranes or skin necrosis: No Has patient had a PCN reaction that required hospitalization: No Has patient had a PCN reaction occurring within the last 10 years: No If all of the above answers are "NO", then may proceed with Cephalosporin use.   Cialis [Tadalafil] Other (See Comments)    Indigestion   Lipitor [Atorvastatin] Other (See Comments)    Severe Myalgias   Lisinopril Cough    Medications:   Prior to Admission medications   Medication Sig Start Date End Date Taking? Authorizing Provider  acetaminophen (TYLENOL 8 HOUR) 650 MG CR tablet Take 1 tablet (650 mg total) by mouth 2 (two) times a day. 06/24/18  Yes Eustaquio Boyden, MD  aspirin 81 MG chewable tablet Chew 1 tablet by mouth daily.   Yes [provider]  Cholecalciferol (VITAMIN D3) 25 MCG (1000 UT) CAPS Take 1 capsule (1,000 Units total) by mouth daily. 08/25/18  Yes Eustaquio Boyden, MD  clopidogrel (PLAVIX) 75 MG tablet Take 75 mg by mouth daily. 10/10/20  Yes  [provider]  cyanocobalamin 1000 MCG tablet Take 1,000 mcg by mouth daily.   Yes [provider]  docusate sodium (COLACE) 100 MG capsule Take 1 capsule (100 mg total) by mouth daily. 12/13/20  Yes Eustaquio Boyden, MD  doxazosin (CARDURA) 4 MG tablet Take 4 mg by mouth daily. 02/13/23  Yes [provider]  famotidine (PEPCID) 20 MG tablet TAKE 1 TABLET BY MOUTH AT  BEDTIME 01/23/21  Yes Eustaquio Boyden, MD  finasteride (PROSCAR) 5 MG tablet TAKE 1 TABLET BY MOUTH  DAILY 11/17/19  Yes Eustaquio Boyden, MD  furosemide (LASIX) 20 MG tablet Take 1 tablet by mouth every other day. 10/09/20  Yes [provider]  hydrALAZINE (APRESOLINE) 50 MG tablet Take 1 tablet (50 mg total) by mouth every 8 (eight) hours as needed (systolic (top number) blood pressure higher than 160 OR diastolic (bottom number) blood pressure higher than 100). 12/13/22  Yes Sunnie Nielsen, DO  isosorbide mononitrate (IMDUR) 60 MG 24 hr tablet Take 1 tablet (60 mg total) by mouth 2 (two) times daily. 03/31/22  Yes Enedina Finner, MD  loratadine (CLARITIN) 10 MG  tablet Take 1 tablet (10 mg total) by mouth daily as needed for allergies. 12/22/12  Yes Eustaquio Boyden, MD  Melatonin 10 MG TABS Take 1 tablet by mouth daily as needed (for sleep).   Yes [provider]  metoprolol succinate (TOPROL-XL) 25 MG 24 hr tablet Take 1 tablet (25 mg total) by mouth 2 (two) times daily. 12/13/22  Yes Sunnie Nielsen, DO  nitroGLYCERIN (NITROSTAT) 0.4 MG SL tablet Place 1 tablet (0.4 mg total) under the tongue every 5 (five) minutes x 3 doses as needed for chest pain. 03/31/22  Yes Enedina Finner, MD  pantoprazole (PROTONIX) 40 MG tablet Take 40 mg by mouth 2 (two) times daily. 02/14/22 04/02/23 Yes [provider]  primidone (MYSOLINE) 50 MG tablet Take 2 tablets (100 mg total) by mouth 3 (three) times daily. 10/16/20  Yes Eustaquio Boyden, MD  rosuvastatin (CRESTOR) 20 MG tablet Take 20 mg by  mouth at bedtime.   Yes [provider]  sucralfate (CARAFATE) 1 GM/10ML suspension Take 1 g by mouth 2 (two) times daily.   Yes [provider]  clindamycin (CLEOCIN) 150 MG capsule SMARTSIG:4 Capsule(s) By Mouth Patient not taking: Reported on 04/02/2023 02/27/23   [provider]    Discontinued Meds:   Medications Discontinued During This Encounter  Medication Reason   Tdap (BOOSTRIX) 5-2.5-18.5 LF-MCG/0.5 injection    aspirin chewable tablet 81 mg    acetaminophen (TYLENOL) tablet 650 mg    sucralfate (CARAFATE) 1 GM/10ML suspension 1 g    sucralfate (CARAFATE) 1 GM/10ML suspension 1 g    sucralfate (CARAFATE) 1 GM/10ML suspension 1 g    metroNIDAZOLE (FLAGYL) IVPB 500 mg    HYDROmorphone (DILAUDID) injection 0.5 mg    famotidine (PEPCID) tablet 20 mg    rosuvastatin (CRESTOR) tablet 20 mg    furosemide (LASIX) injection 40 mg    docusate sodium (COLACE) capsule 100 mg    clopidogrel (PLAVIX) tablet 75 mg    linezolid (ZYVOX) IVPB 600 mg    cefTRIAXone (ROCEPHIN) 2 g in sodium chloride 0.9 % 100 mL IVPB    oxidized cellulose (Surgicel) pad 1 each    pantoprazole (PROTONIX) EC tablet 40 mg    famotidine (PEPCID) tablet 10 mg    HYDROmorphone (DILAUDID) injection 0.5 mg     Social History:  reports that he quit smoking about 22 years ago. His smoking use included pipe and cigarettes. He has been exposed to tobacco smoke. His smokeless tobacco use includes chew. He reports current alcohol use. He reports that he does not use drugs.  Family History:   Family History  Problem Relation Age of Onset   Hypertension Mother    Heart disease Brother 74       MI   Diabetes Brother    Cancer Daughter        melanoma   Stroke Neg Hx     Blood pressure 106/70, pulse (!) 111, temperature 98.4 F (36.9 C), temperature source Axillary, resp. rate (!) 27, height 5\' 10"  (1.778 m), weight 89.4 kg, SpO2 97%. General: Mild distress, cervical collar in place,  chronically ill appearing Neuro: A&Ox1 to person Cardiovascular: RRR, no murmurs, no peripheral edema, no JVD (difficult to assess due to cervical collar) Respiratory: mildly tachypnic, no retractions, rhonchi present bilaterally Abdomen: soft, mild suprapubic tenderness, no rebound or guarding Extremities: Moving all 4 extremities equally     Celine Mans, MD, PGY-2 [facility] 11:50 AM 04/08/2023

## 2023-04-08 NOTE — Plan of Care (Signed)
  Problem: Clinical Measurements: Goal: Will remain free from infection Outcome: Progressing   Problem: Education: Goal: Knowledge of General Education information will improve Description: Including pain rating scale, medication(s)/side effects and non-pharmacologic comfort measures Outcome: Progressing   Problem: Clinical Measurements: Goal: Ability to maintain clinical measurements within normal limits will improve Outcome: Progressing Goal: Will remain free from infection Outcome: Progressing Goal: Diagnostic test results will improve Outcome: Progressing Goal: Respiratory complications will improve Outcome: Progressing Goal: Cardiovascular complication will be avoided Outcome: Progressing   Problem: Health Behavior/Discharge Planning: Goal: Ability to manage health-related needs will improve Outcome: Progressing

## 2023-04-09 ENCOUNTER — Ambulatory Visit: Payer: Medicare Other | Admitting: Urology

## 2023-04-09 DIAGNOSIS — Z515 Encounter for palliative care: Secondary | ICD-10-CM

## 2023-04-09 DIAGNOSIS — I513 Intracardiac thrombosis, not elsewhere classified: Secondary | ICD-10-CM | POA: Insufficient documentation

## 2023-04-09 LAB — CBC
HCT: 29.9 % — ABNORMAL LOW (ref 39.0–52.0)
Hemoglobin: 9.4 g/dL — ABNORMAL LOW (ref 13.0–17.0)
MCH: 30.1 pg (ref 26.0–34.0)
MCHC: 31.4 g/dL (ref 30.0–36.0)
MCV: 95.8 fL (ref 80.0–100.0)
Platelets: 298 10*3/uL (ref 150–400)
RBC: 3.12 MIL/uL — ABNORMAL LOW (ref 4.22–5.81)
RDW: 16.1 % — ABNORMAL HIGH (ref 11.5–15.5)
WBC: 18.1 10*3/uL — ABNORMAL HIGH (ref 4.0–10.5)
nRBC: 1.4 % — ABNORMAL HIGH (ref 0.0–0.2)

## 2023-04-09 LAB — CULTURE, BLOOD (ROUTINE X 2)
Culture: NO GROWTH
Culture: NO GROWTH

## 2023-04-09 LAB — URINE CULTURE: Culture: >=100000 — AB

## 2023-04-09 LAB — BASIC METABOLIC PANEL
Anion gap: 18 — ABNORMAL HIGH (ref 5–15)
BUN: 135 mg/dL — ABNORMAL HIGH (ref 8–23)
CO2: 16 mmol/L — ABNORMAL LOW (ref 22–32)
Calcium: 8.2 mg/dL — ABNORMAL LOW (ref 8.9–10.3)
Chloride: 119 mmol/L — ABNORMAL HIGH (ref 98–111)
Creatinine, Ser: 4.11 mg/dL — ABNORMAL HIGH (ref 0.61–1.24)
GFR, Estimated: 13 mL/min — ABNORMAL LOW (ref >60)
Glucose, Bld: 173 mg/dL — ABNORMAL HIGH (ref 70–99)
Potassium: 4.6 mmol/L (ref 3.5–5.1)
Sodium: 153 mmol/L — ABNORMAL HIGH (ref 135–145)

## 2023-04-22 NOTE — Plan of Care (Signed)
     Referral previously received for Shawn Andrews for goals of care discussion. Chart reviewed. I messaged the provider to inform them I was coming to see the patient and was notified that he had just passed.  Thank you for your referral and allowing PMT to assist in [name]'s care.   Wynne Dust, NP Palliative Medicine Team Phone: [phone]  NO CHARGE

## 2023-04-22 NOTE — Death Summary Note (Addendum)
  Name: Shawn Andrews MRN: [id number] DOB: 1928-10-19 89 y.o.  Date of Admission: 04/02/2023 11:12 AM Date of Discharge: 03/23/2023 Attending Physician: Dickie La, MD  Discharge Diagnosis: Principal Problem:   Fall Active Problems:   Essential hypertension   Chronic kidney disease, stage 3b (HCC)   CAD (coronary artery disease)   HFrEF (heart failure with reduced ejection fraction) (HCC)   Pleural effusion   UTI (urinary tract infection)   Suprapubic catheter (HCC)   Subdural hematoma (HCC)   Cervical transverse process fracture (HCC)   Fracture of cervical spinous process (HCC)   Troponin level elevated   Aspiration pneumonia (HCC)   AKI (acute kidney injury) (HCC)   Closed displaced fracture of fifth cervical vertebra (HCC)   Closed nondisplaced fracture of first cervical vertebra (HCC)   Aspiration into airway   Left ventricular apical thrombus   Cause of death: Cardiopulmonary failure  Time of death: 06:26 AM  Disposition and follow-up:   Shawn Andrews was discharged from [facility] in expired condition.      Hospital Course: 89 year old male with past medical history of CAD status post PCI and stent LAD with DES on aspirin and Plavix, HFrEF (EF 45-50%), hypertension, hyperlipidemia, CKD stage IIIb with suprapubic catheter presented to ED after mechanical fall sustaining cervical fractures at C1 and C5, subdural hematoma along the left aspect of falx and left cerebral convexity, forehead, periorbital, facial hematomas, large forehead laceration, and facial abrasions.   This patient's hospital stay was complicated by acute blood loss anemia c/b type 2 NSTEMI and acute kidney injury progressing to renal failure and ATN, acute metabolic/uremic encephalopathy, dysphagia w/ concern for recurrent aspiration and pneumonia, possible UTI, and worsened HF with EF <20% and possible apical thrombus.   Unfortunately, throughout hospitalization this patient continued  to decline. Repeat CT head showed increasing of the subdural hematoma on the left cerebral convexity and CT chest with worsening of LLL opacity c/f pneumonia. His mentation also changed, becoming more disoriented and delirious.  His kidney function worsened throughout hospitalization. Nephrology was consulted with concern for ATN. Repeat echo was done that showed worsening heart failure with EF of less than 20% and a probable apical thrombus.  At that point, given multiorgan dysfunction, we engaged with family about goals of care, and involving palliative care.  Family at bedside, understanding the situation and respected patient's wishes, supported keeping the patient comfortable and avoiding any invasive procedures.  Patient's wife was at bedside, reported that he passed away peacefully. Upon my evaluation, family was at bedside with grief. I took off the C-collar, he appeared at peace. Family appreciated.    SignedJeral Pinch, DO 04/19/2023, 7:58 AM

## 2023-04-22 NOTE — Progress Notes (Deleted)
Error

## 2023-04-22 NOTE — Plan of Care (Signed)
  Problem: Clinical Measurements: Goal: Will remain free from infection Outcome: Progressing   Problem: Health Behavior/Discharge Planning: Goal: Ability to manage health-related needs will improve Outcome: Not Progressing   

## 2023-04-22 NOTE — Progress Notes (Signed)
 Patient expired at approximately 0626, spouse at bedside, MD and [facility] notified. Family at bedside at this time. Me notified.

## 2023-04-22 DEATH — deceased

## 2023-09-18 IMAGING — CR DG CHEST 2V
2 series · 2 of 2 positions shown · non-contrast
Comparison: November 10, 2017

CLINICAL DATA: Chest pain for a few weeks worse with activity

EXAM:
CHEST - 2 VIEW

[chest pa]
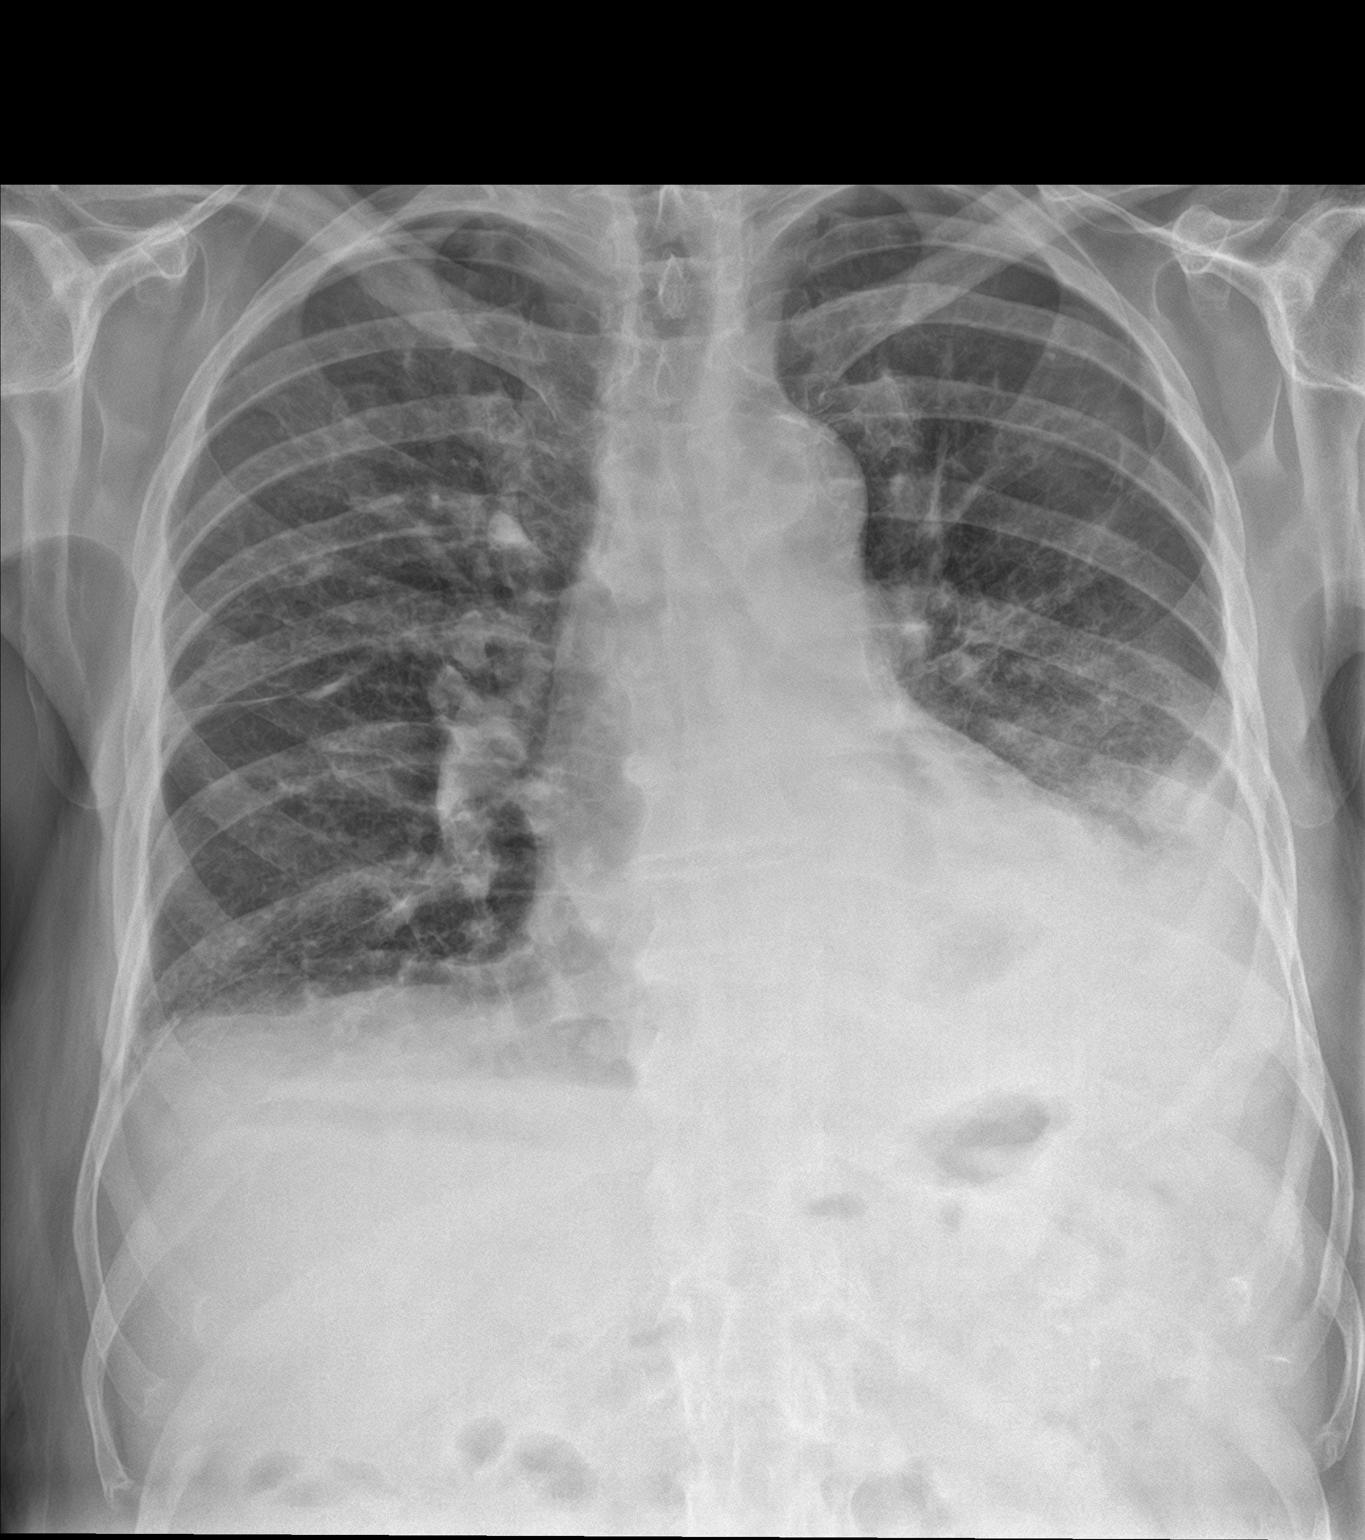

[chest lat]
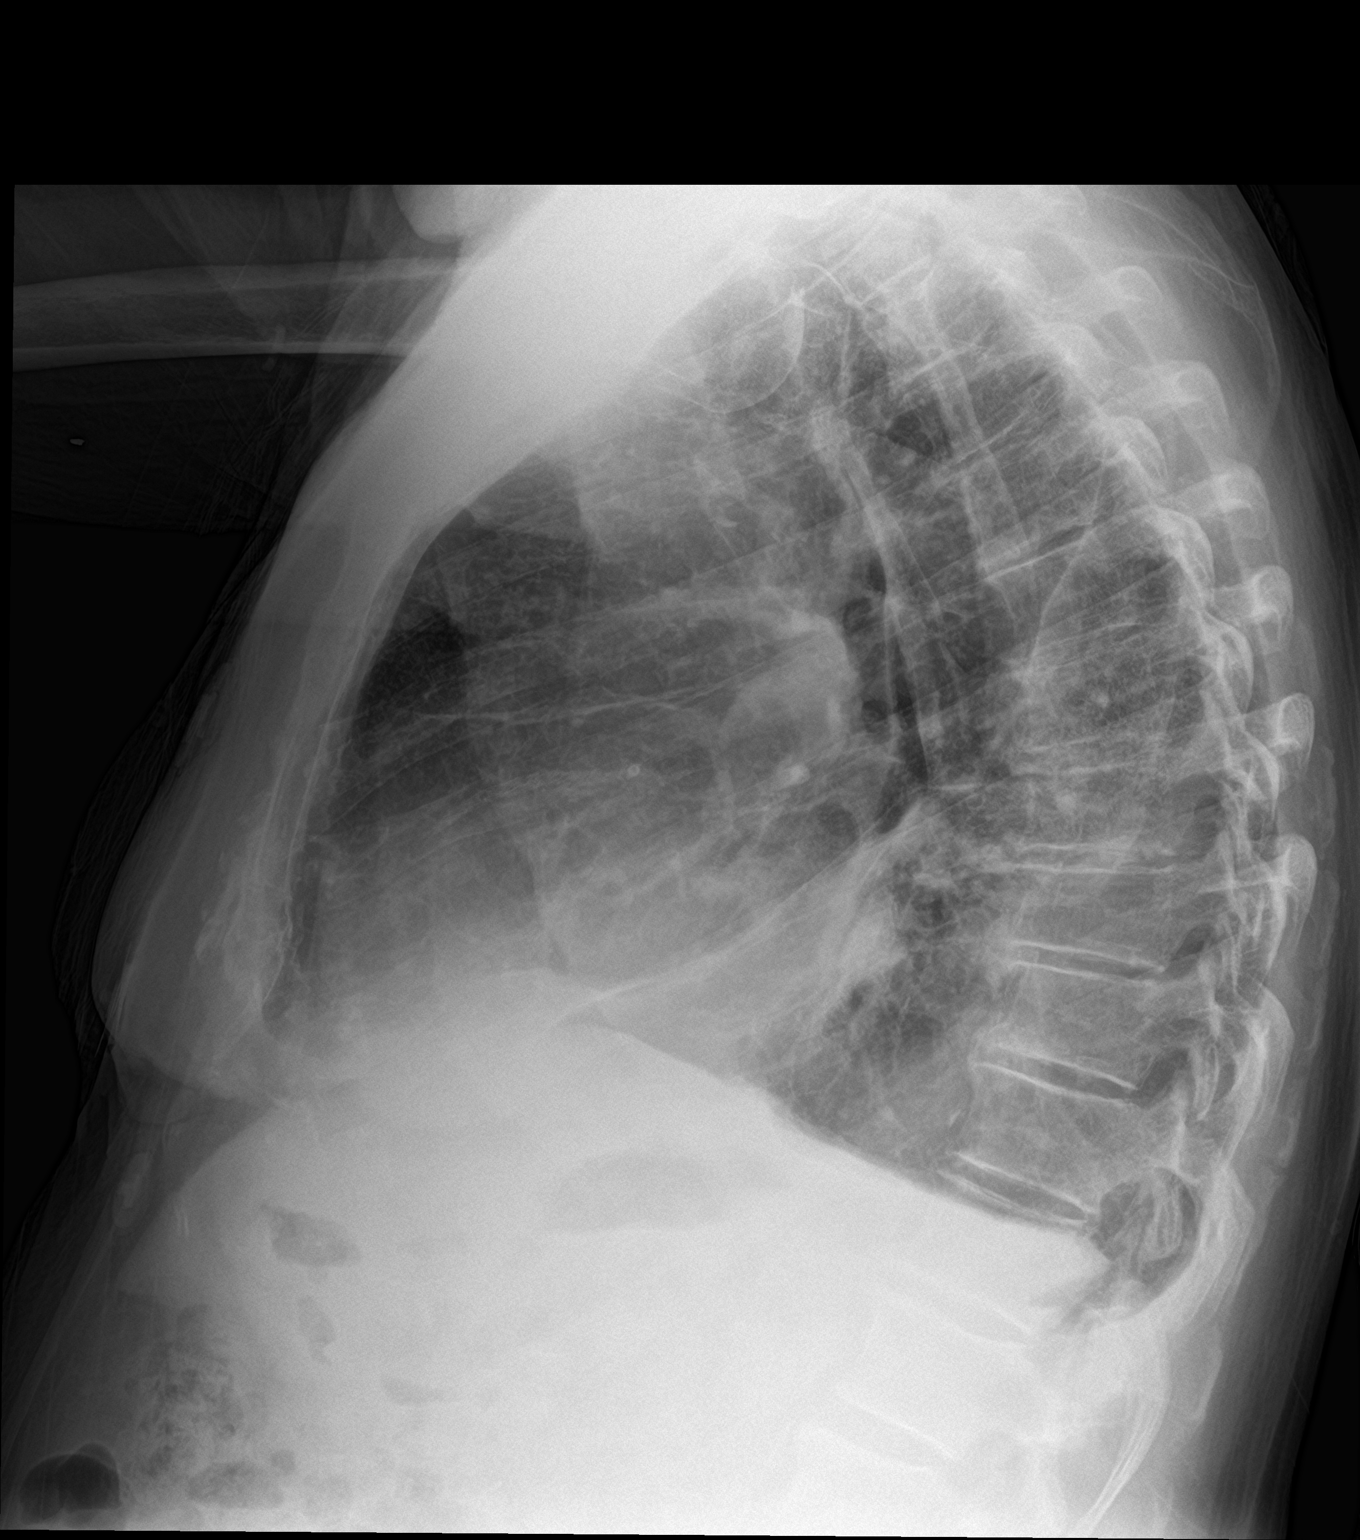

[2 of 2 positions shown; findings below may reference images not displayed]

FINDINGS: The heart size and mediastinal contours are within normal limits.
Aortic atherosclerosis. Chronic bronchitic lung changes. Anterior
left pleural thickening versus loculated effusion. Left basilar
airspace opacity. Thoracic spondylosis.
IMPRESSION: Anterior left pleural thickening versus loculated effusion with a
left basilar airspace opacity which may reflect atelectasis or
infiltrate.

## 2023-09-19 IMAGING — CT CT CHEST W/O CM
2 of 4 series · 15 of 36 positions shown, 18 images · non-contrast
Comparison: Chest radiograph dated 01/16/2021 and CT dated
11/10/2017.

CLINICAL DATA: Respiratory illness.

EXAM:
CT CHEST WITHOUT CONTRAST
TECHNIQUE: Multidetector CT imaging of the chest was performed following the
standard protocol without IV contrast.

[Series 2: thorax · axial · 0.73mm/px · z∈[-360,-96]mm · 12 of 156 slices shown, 15 images]
[im 12/156  mediastinal]
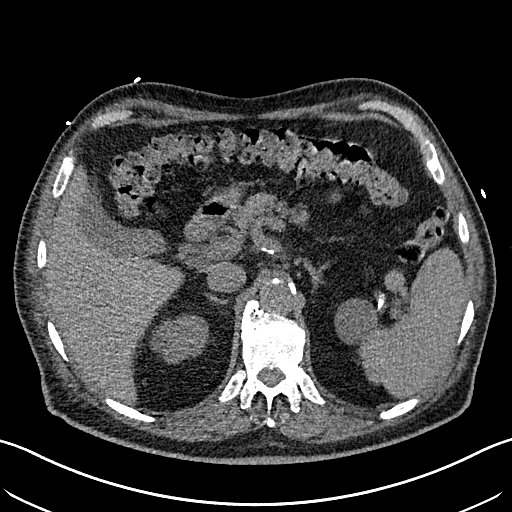
[im 12/156  lung]
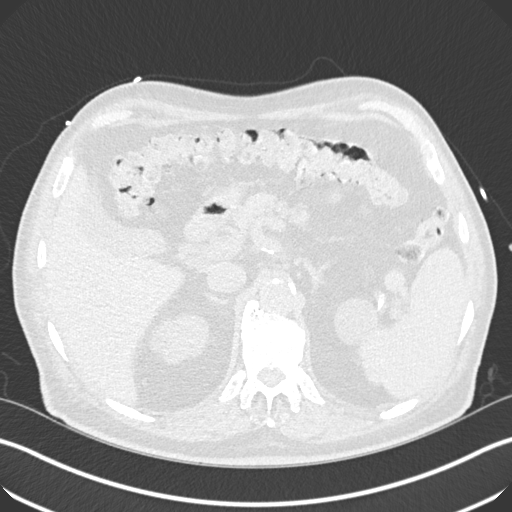
[im 24/156  lung]
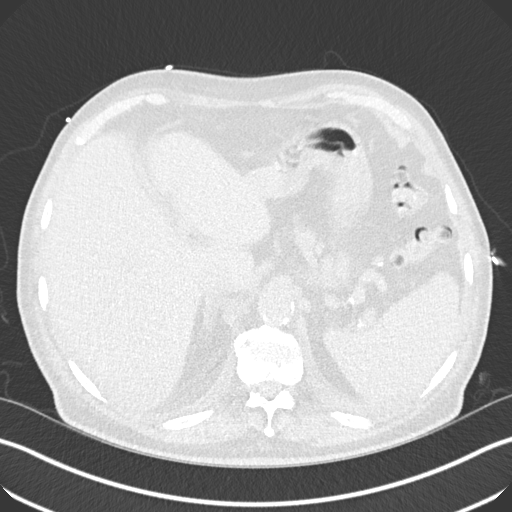
[im 36/156  lung]
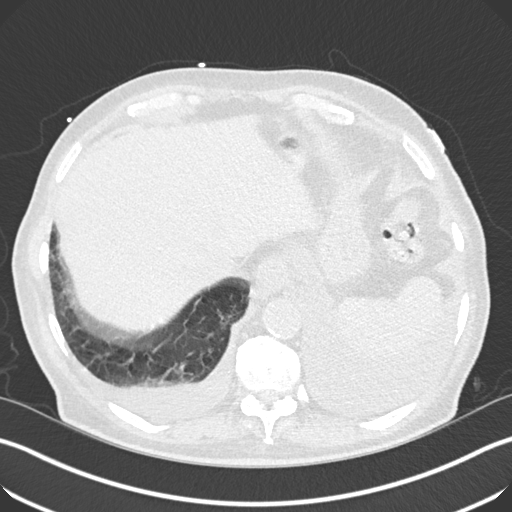
[im 48/156  lung]
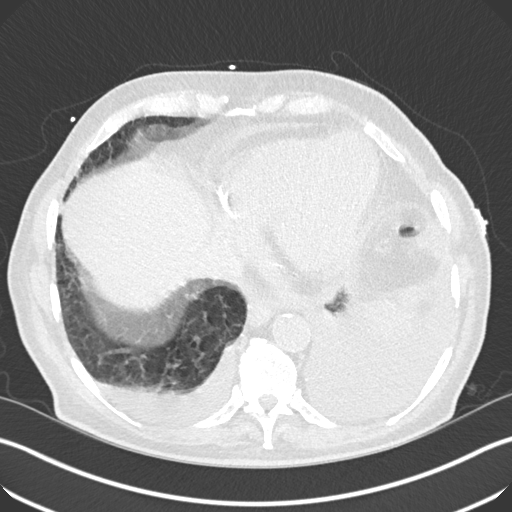
[im 60/156  mediastinal]
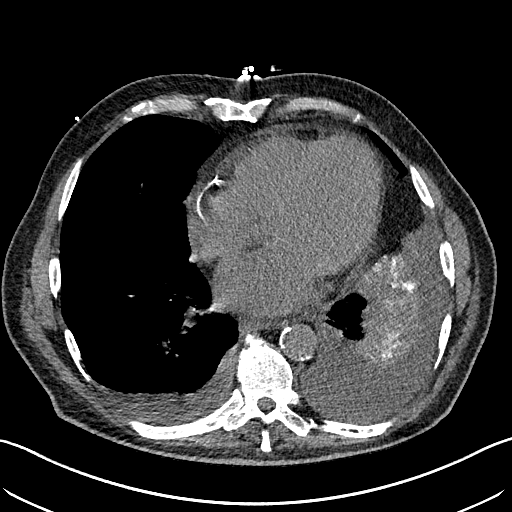
[im 60/156  lung]
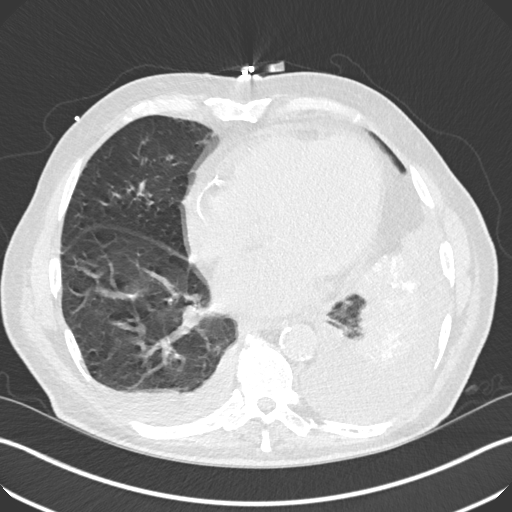
[im 72/156  lung]
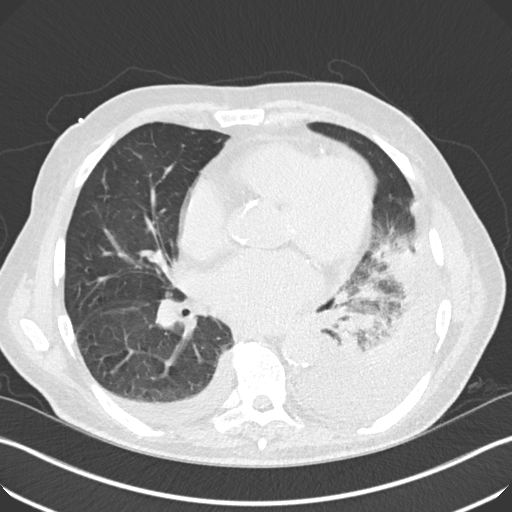
[im 84/156  lung]
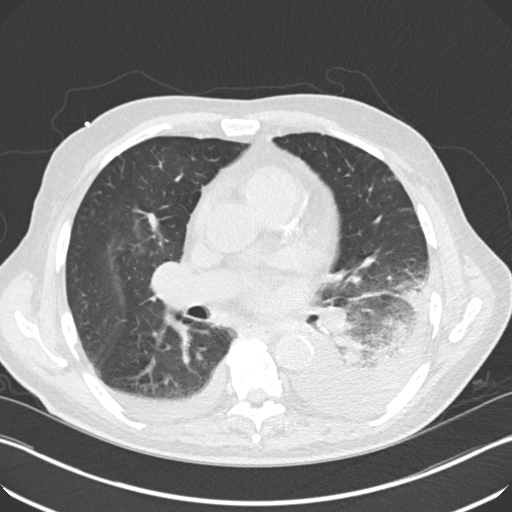
[im 96/156  lung]
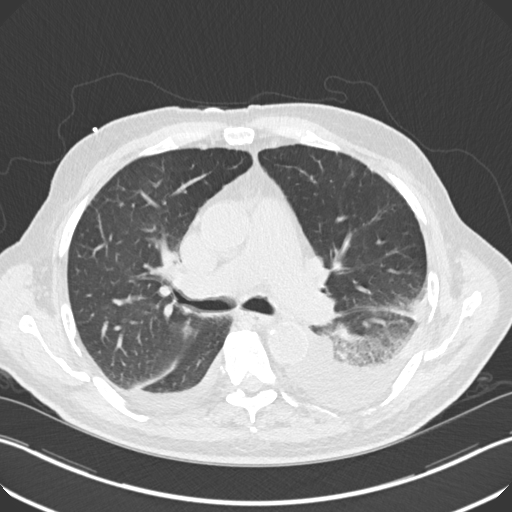
[im 108/156  mediastinal]
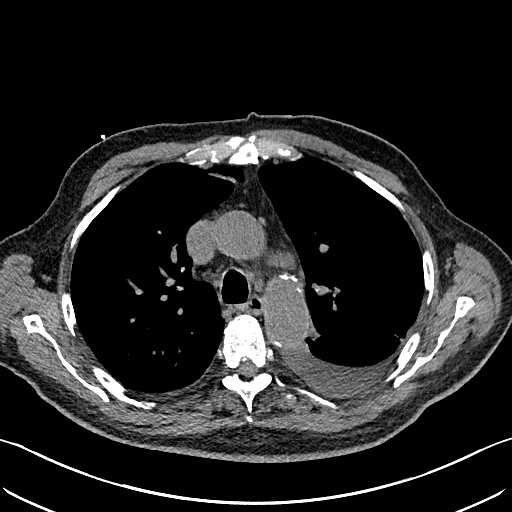
[im 108/156  lung]
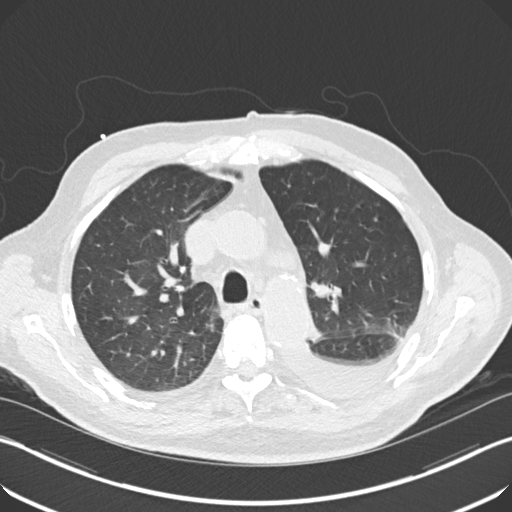
[im 120/156  lung]
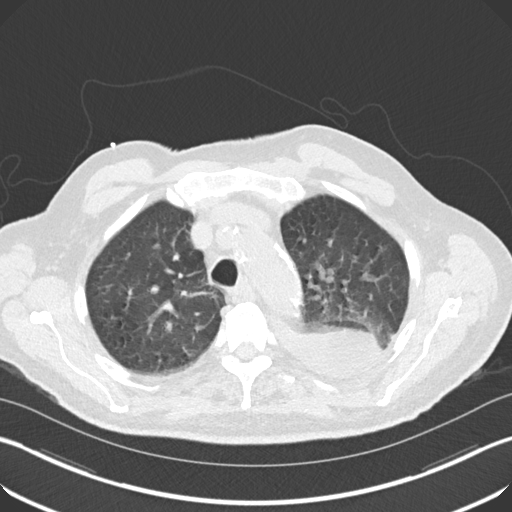
[im 132/156  lung]
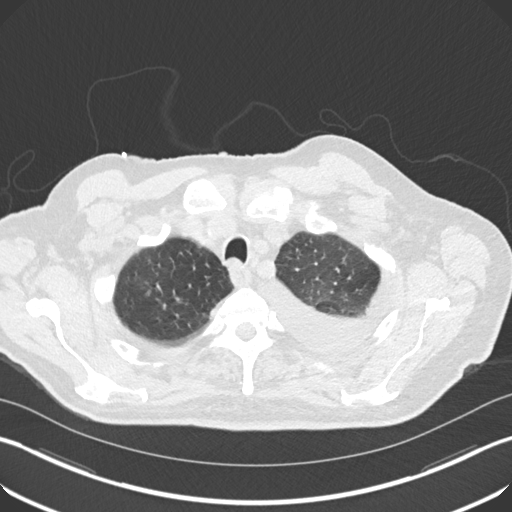
[im 144/156  lung]
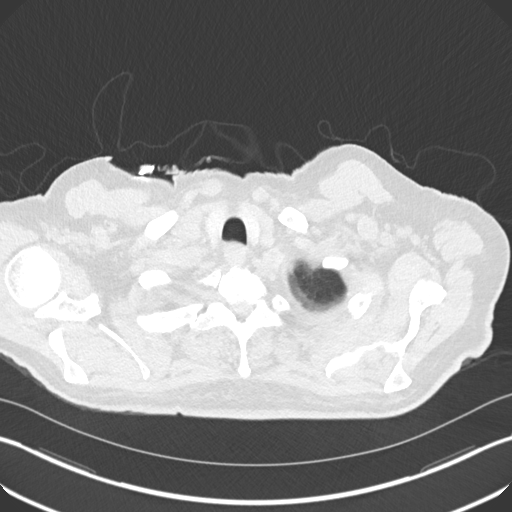

[Series 5: coronal · coronal · 0.66mm/px · 3 of 146 slices shown]
[im 30/146  lung]
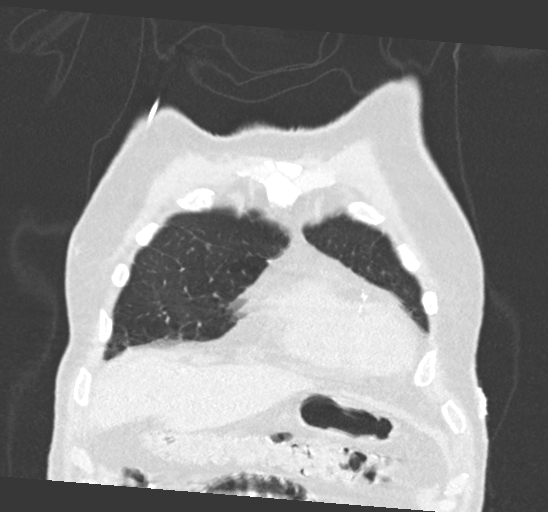
[im 59/146  lung]
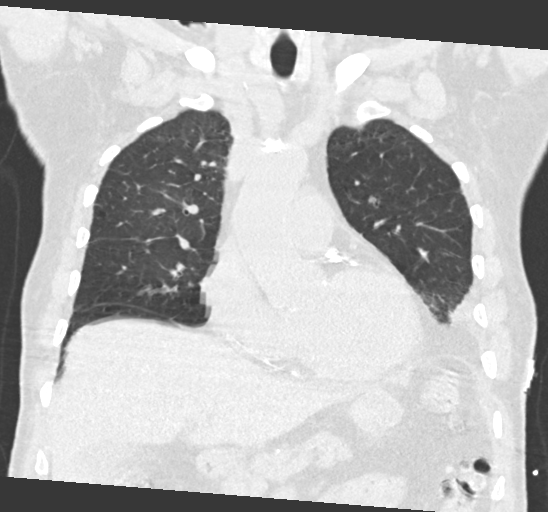
[im 88/146  lung]
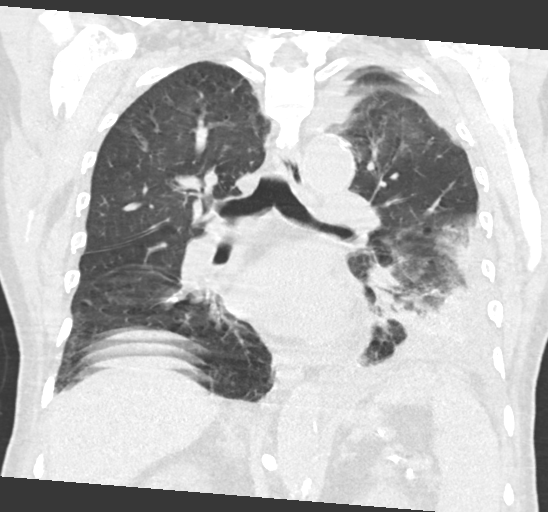

[15 of 36 positions shown; findings below may reference images not displayed]

FINDINGS: Evaluation of this exam is limited in the absence of intravenous
contrast.

Cardiovascular: Borderline cardiomegaly. No significant pericardial
effusion. Advanced 3 vessel coronary vascular calcification. There
is moderate atherosclerotic calcification of the thoracic aorta. No
aneurysmal dilatation. The central pulmonary arteries are grossly
unremarkable.

Mediastinum/Nodes: No hilar or mediastinal adenopathy. The esophagus
is grossly unremarkable. No mediastinal fluid collection.

Lungs/Pleura: Moderate left and small right pleural effusions. There
is partial compressive atelectasis of the left lower lobe. There are
however patchy areas of nodular density and consolidative changes
involving the left lower lobe and lingula most concerning for
pneumonia or aspiration. Clinical correlation and follow-up to
resolution recommended. No pneumothorax. The central airways are
patent. Small calcified granuloma along the right minor fissure.

Upper Abdomen: Diffuse colonic diverticulosis. Bilateral renal
hypodense lesions, incompletely characterized, possibly cysts.

Musculoskeletal: Osteopenia with degenerative changes of the spine.
No acute osseous pathology.
IMPRESSION: 1. Moderate left and small right pleural effusions with partial
compressive atelectasis of the left lower lobe.
2. Patchy areas of nodular density and consolidative changes
involving the left lower lobe and lingula most concerning for
pneumonia or aspiration.
3. Aortic Atherosclerosis ([id number]).
